# Patient Record
Sex: Male | Born: 1965 | Hispanic: No | Marital: Married | Smoking: Never smoker
Health system: Southern US, Community
[De-identification: ages and names within clinical notes are randomized; demographics above are authoritative.]

## PROBLEM LIST (undated history)

## (undated) DIAGNOSIS — E119 Type 2 diabetes mellitus without complications: Secondary | ICD-10-CM

## (undated) DIAGNOSIS — R413 Other amnesia: Secondary | ICD-10-CM

## (undated) DIAGNOSIS — R188 Other ascites: Secondary | ICD-10-CM

## (undated) DIAGNOSIS — K7030 Alcoholic cirrhosis of liver without ascites: Secondary | ICD-10-CM

---

## 2019-05-23 ENCOUNTER — Ambulatory Visit (INDEPENDENT_AMBULATORY_CARE_PROVIDER_SITE_OTHER): Payer: Self-pay

## 2019-05-23 ENCOUNTER — Encounter: Payer: Self-pay | Admitting: Emergency Medicine

## 2019-05-23 ENCOUNTER — Other Ambulatory Visit: Payer: Self-pay

## 2019-05-23 ENCOUNTER — Ambulatory Visit
Admission: EM | Admit: 2019-05-23 | Discharge: 2019-05-23 | Disposition: A | Discharge status: Nursing Home (Includes ICF) | Payer: Self-pay | Attending: Urgent Care | Admitting: Urgent Care

## 2019-05-23 ENCOUNTER — Emergency Department
Admission: EM | Admit: 2019-05-23 | Discharge: 2019-05-24 | Disposition: A | Discharge status: Other Acute Inpatient Hospital | Payer: HRSA Program | Attending: Emergency Medicine | Admitting: Emergency Medicine

## 2019-05-23 DIAGNOSIS — E871 Hypo-osmolality and hyponatremia: Secondary | ICD-10-CM | POA: Diagnosis not present

## 2019-05-23 DIAGNOSIS — R6889 Other general symptoms and signs: Secondary | ICD-10-CM

## 2019-05-23 DIAGNOSIS — R05 Cough: Secondary | ICD-10-CM

## 2019-05-23 DIAGNOSIS — U071 COVID-19: Secondary | ICD-10-CM | POA: Diagnosis not present

## 2019-05-23 DIAGNOSIS — R509 Fever, unspecified: Secondary | ICD-10-CM | POA: Diagnosis not present

## 2019-05-23 DIAGNOSIS — J189 Pneumonia, unspecified organism: Secondary | ICD-10-CM | POA: Insufficient documentation

## 2019-05-23 DIAGNOSIS — R1084 Generalized abdominal pain: Secondary | ICD-10-CM | POA: Diagnosis present

## 2019-05-23 DIAGNOSIS — Z20822 Contact with and (suspected) exposure to covid-19: Secondary | ICD-10-CM

## 2019-05-23 DIAGNOSIS — R0902 Hypoxemia: Secondary | ICD-10-CM | POA: Insufficient documentation

## 2019-05-23 DIAGNOSIS — R059 Cough, unspecified: Secondary | ICD-10-CM

## 2019-05-23 DIAGNOSIS — B342 Coronavirus infection, unspecified: Secondary | ICD-10-CM

## 2019-05-23 LAB — COMPREHENSIVE METABOLIC PANEL
ALT: 112 U/L — ABNORMAL HIGH (ref 0–44)
AST: 303 U/L — ABNORMAL HIGH (ref 15–41)
Albumin: 3.5 g/dL (ref 3.5–5.0)
Alkaline Phosphatase: 142 U/L — ABNORMAL HIGH (ref 38–126)
Anion gap: 15 (ref 5–15)
BUN: 18 mg/dL (ref 6–20)
CO2: 22 mmol/L (ref 22–32)
Calcium: 8.3 mg/dL — ABNORMAL LOW (ref 8.9–10.3)
Chloride: 88 mmol/L — ABNORMAL LOW (ref 98–111)
Creatinine, Ser: 1.05 mg/dL (ref 0.61–1.24)
GFR calc Af Amer: >60 mL/min (ref >60)
GFR calc non Af Amer: >60 mL/min (ref >60)
Glucose, Bld: 267 mg/dL — ABNORMAL HIGH (ref 70–99)
Potassium: 3.3 mmol/L — ABNORMAL LOW (ref 3.5–5.1)
Sodium: 125 mmol/L — ABNORMAL LOW (ref 135–145)
Total Bilirubin: 1.6 mg/dL — ABNORMAL HIGH (ref 0.3–1.2)
Total Protein: 8.1 g/dL (ref 6.5–8.1)

## 2019-05-23 LAB — URINALYSIS, COMPLETE (UACMP) WITH MICROSCOPIC
Bacteria, UA: NONE SEEN
Bilirubin Urine: NEGATIVE
Glucose, UA: >=500 mg/dL — AB
Hgb urine dipstick: NEGATIVE
Ketones, ur: NEGATIVE mg/dL
Leukocytes,Ua: NEGATIVE
Nitrite: NEGATIVE
Protein, ur: 100 mg/dL — AB
Specific Gravity, Urine: 1.026 (ref 1.005–1.030)
pH: 6 (ref 5.0–8.0)

## 2019-05-23 LAB — CBC
HCT: 45 % (ref 39.0–52.0)
Hemoglobin: 16.6 g/dL (ref 13.0–17.0)
MCH: 33.9 pg (ref 26.0–34.0)
MCHC: 36.9 g/dL — ABNORMAL HIGH (ref 30.0–36.0)
MCV: 92 fL (ref 80.0–100.0)
Platelets: 146 10*3/uL — ABNORMAL LOW (ref 150–400)
RBC: 4.89 MIL/uL (ref 4.22–5.81)
RDW: 11.6 % (ref 11.5–15.5)
WBC: 6.6 10*3/uL (ref 4.0–10.5)
nRBC: 0 % (ref 0.0–0.2)

## 2019-05-23 LAB — SARS CORONAVIRUS 2 BY RT PCR (HOSPITAL ORDER, PERFORMED IN ~~LOC~~ HOSPITAL LAB): SARS Coronavirus 2: POSITIVE — AB

## 2019-05-23 LAB — LIPASE, BLOOD: Lipase: 59 U/L — ABNORMAL HIGH (ref 11–51)

## 2019-05-23 MED ORDER — SODIUM CHLORIDE 0.9 % IV SOLN
2.0000 g | INTRAVENOUS | Status: DC
  Administered 2019-05-24: 2 g via INTRAVENOUS
  Filled 2019-05-23: qty 20

## 2019-05-23 MED ORDER — ACETAMINOPHEN 500 MG PO TABS
1000.0000 mg | ORAL_TABLET | Freq: Once | ORAL | Status: AC
  Administered 2019-05-24: 1000 mg via ORAL
  Filled 2019-05-23: qty 2

## 2019-05-23 MED ORDER — THIAMINE HCL 100 MG/ML IJ SOLN: 100.0000 mg | Freq: Every day | INTRAMUSCULAR | Status: DC

## 2019-05-23 MED ORDER — LORAZEPAM 2 MG/ML IJ SOLN
0.0000 mg | Freq: Four times a day (QID) | INTRAMUSCULAR | Status: DC
  Filled 2019-05-23: qty 1

## 2019-05-23 MED ORDER — SODIUM CHLORIDE 0.9 % IV BOLUS (SEPSIS)
1000.0000 mL | Freq: Once | INTRAVENOUS | Status: AC
  Administered 2019-05-24: 1000 mL via INTRAVENOUS

## 2019-05-23 MED ORDER — SODIUM CHLORIDE 0.9 % IV SOLN
500.0000 mg | INTRAVENOUS | Status: DC
  Administered 2019-05-24: 500 mg via INTRAVENOUS
  Filled 2019-05-23: qty 500

## 2019-05-23 MED ORDER — SODIUM CHLORIDE 0.9 % IV BOLUS (SEPSIS)
500.0000 mL | Freq: Once | INTRAVENOUS | Status: AC
  Administered 2019-05-24: 500 mL via INTRAVENOUS

## 2019-05-23 NOTE — ED Triage Notes (Addendum)
Pt complains of fever, loss of appetite, cough that causes him to vomit. Did have some abdominal pain yesterday and did take something over the counter and made his stomach feel better. Last time he was able to eat a full meal was Saturday. Did have some chicken soup today

## 2019-05-23 NOTE — ED Provider Notes (Signed)
Name: Shawn Torres DOB: 10/12/1966 MRN: [id number] CSN: [id number] PCP: Patient, No Pcp Per  Arrival date and time:  05/23/19 1659  Chief Complaint:  Fever and Cough   NOTE: Prior to seeing the patient today, I have reviewed the triage nursing documentation and vital signs. Clinical staff has updated patient's PMH/PSHx, current medication list, and drug allergies/intolerances to ensure comprehensive history available to assist in medical decision making.   History:    NOTE: Patient is Spanish speaking only. H&P facilitated by both tele-interpreter service and daughter.   HPI: Shawn Torres is a 53 y.o. male who presents today with complaints of cough and fever that began on Saturday (05/18/2019); Tmax unknown.  He has not been using any type of antipyretic medications at home. His cough is reported to be productive of white sputum. Patient advises that the cough is so bad that it causes him to experience post tussive emesis episodes. 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  He advises that there have been several people at his job who have tested positive for "the virus". He is unable to report how much contact he had with the infected individuals. Daughter states, "they told him there were people there with the virus, but they didn't tell him who it was". Patient indicating that he has never been tested for SARS-CoV-2 (novel coronavirus).  Additionally, he notes that he has had diffuse abdominal pain that he feels is secondary to constipation.  Patient notes that his LNBM was several days ago, however he states that he is "unable to remember for sure".  Patient advising that he took some over-the-counter "leche" yesterday, which caused his bowels to move and made his stomach feel better overall. Since the onset of his symptoms on Saturday, patient notes that he has had persistent nausea and vomiting. Oral intake has been poor per daughter's report.  Patient's last normal meal  was on Saturday.  She advises that he was able to tolerate some chicken soup today, however advises that the amount was very little. Daughter also makes mention of the fact that patient is a heavy daily alcohol drinker, however the amount of alcohol has been less in the setting of his acute illness. Daughter questioning correlation between his abdominal pain and daily alcohol intake.   Patient presents to clinic today febrile at 100.8. He is not tachycardic at rest; low 110s with ambulation. His pulse oximetry on room air was found to be on the lower side of normal (92%) in triage; desaturates with ambulation (as low as 88%). Of note, patient's daughter and wife in the exam room in close proximity to patient and not wearing masks/face coverings.   History reviewed. No pertinent past medical history.  History reviewed. No pertinent surgical history.  Family History  Family history unknown: Yes    Social History   Tobacco Use   Smoking status: Never Smoker   Smokeless tobacco: Never Used  Substance Use Topics   Alcohol use: Yes   Drug use: Never    Home Medications:    No outpatient medications have been marked as taking for the 05/23/19 encounter [facility](Hospital Encounter).    Allergies:   Patient has no known allergies.  Review of Systems (ROS): Review of Systems  Constitutional: Positive for activity change (decreased), appetite change (decreased), fatigue and fever (Tmax unknown). Negative for chills.  HENT: Positive for congestion (mild). Negative for ear pain, rhinorrhea, sinus pain, sore throat and trouble swallowing.   Eyes: Negative  for pain, discharge and redness.  Respiratory: Positive for cough (productive) and shortness of breath.   Cardiovascular: Negative for chest pain and palpitations.  Gastrointestinal: Positive for abdominal pain (improved some following BM), constipation (LNBM several days ago. Took some OTC "leche" yesterday and was able to have a small bowel  movement), nausea and vomiting.  Genitourinary: Negative for dysuria, flank pain, frequency, hematuria and urgency.  Musculoskeletal: Positive for myalgias. Negative for arthralgias, back pain, neck pain and neck stiffness.  Skin: Negative for color change and pallor.  Neurological: Positive for tremors and weakness (generalized). Negative for dizziness, syncope and headaches.  Hematological: Negative for adenopathy.     Triage Vital Signs: ED Triage Vitals  Enc Vitals Group     BP 05/23/19 1714 (!) 144/94     Pulse Rate 05/23/19 1714 95     Resp 05/23/19 1714 18     Temp 05/23/19 1714 (!) 100.8 F (38.2 C)     Temp Source 05/23/19 1714 Oral     SpO2 05/23/19 1714 92 %     Weight 05/23/19 1718 180 lb (81.6 kg)     Height 05/23/19 1718 5\' 6"  (1.676 m)     Head Circumference --      Peak Flow --      Pain Score 05/23/19 1718 0     Pain Loc --      Pain Edu? --      Excl. in GC? --     Physical Exam: Physical Exam  Constitutional: He is oriented to person, place, and time. He appears not lethargic, to not be writhing in pain, not dehydrated and not jaundiced. He appears not cachectic. He has a sickly appearance (acutely ill appearing; listless). He appears distressed (mild 2/2 acute respiratory condition).  HENT:  Head: Normocephalic and atraumatic.  Mouth/Throat: Oropharynx is clear and moist and mucous membranes are normal.  Eyes: Pupils are equal, round, and reactive to light. EOM are normal.  Neck: Normal range of motion. Neck supple. No tracheal deviation present. No thyromegaly present.  Cardiovascular: Regular rhythm, normal heart sounds and intact distal pulses. Tachycardia present. Exam reveals no gallop and no friction rub.  No murmur heard. Pulmonary/Chest: Tachypnea noted. He has no wheezes. He has rhonchi (scattered throughout; does not improve/clear with cough). He has no rales.  Able to maintain supine positioning without significant difficulties. Breathing stable  at rest, however WOB increases with short distance ambulation.  Abdominal: Soft. Normal appearance and bowel sounds are normal. He exhibits no distension and no ascites. There is no abdominal tenderness. There is no rebound, no guarding and no CVA tenderness.  Lymphadenopathy:    He has no cervical adenopathy.  Neurological: He is alert and oriented to person, place, and time. He appears not lethargic. He displays tremor (fine). Gait normal. GCS score is 15.  Skin: Skin is warm, dry and intact. He is not diaphoretic. No pallor.  (+) facial flushing. No rashes or petechiae observed.   Psychiatric: Mood, memory, affect and judgment normal.  Nursing note and vitals reviewed.   Urgent Care Treatments / Results:   LABS: PLEASE NOTE: all labs that were ordered this encounter are listed, however only abnormal results are displayed. Labs Reviewed - No data to display  EKG: -None  RADIOLOGY: Dg Chest 2 View  Result Date: 05/23/2019 CLINICAL DATA:  Cough EXAM: CHEST - 2 VIEW COMPARISON:  None. FINDINGS: There are multifocal airspace opacities bilaterally. No pneumothorax. No large pleural effusion. The heart size  is enlarged. There is no acute osseous abnormality detected on this exam. IMPRESSION: Multifocal airspace opacities concerning for multifocal pneumonia (viral or bacterial). Electronically Signed   By: Katherine Mantlehristopher  Green M.D.   On: 05/23/2019 17:47    PROCEDURES: Procedures  MEDICATIONS RECEIVED THIS VISIT: Medications - No data to display  PERTINENT CLINICAL COURSE NOTES/UPDATES: Clinical Course as of May 23 609  Thu May 23, 2019  1801 Report called to ED charge RN Daphine Deutscher(Martin, RN) to advise of presenting complaints, assessment, and transfer plan of care to the ED for further evaluation and admission for CAP secondary to probable SARS-CoV-2 (novel coronavirus) infection. Patient will be arriving via POV with his family.    [BG]    Clinical Course User Index [BG] Verlee MonteGray, Alanny Rivers E, NP    Initial Impression / Assessment and Plan / Urgent Care Course:    Shawn Torres is a 53 y.o. male who [facility] with complaints of Fever and Cough   Pertinent labs & imaging results that were available during my care of the patient were personally reviewed by me and considered in my medical decision making (see lab/imaging section of note for values and interpretations).  Patient acutely ill appearing today in clinic. His presenting symptom constellation has been progressive since Saturday. He has had an intermittently productive cough (white sputum) with associated post-tussive emesis episodes. He has reportedly been febrile at home, however temperatures have not been measured; presents with fever of 100.8 today. He has had persistent nausea, vomiting, and constipation  since Saturday. (+) abdominal pain from Saturday until yesterday; relieved after taking OTC intervention which produced a small bowel movement. His oral fluid intake has been seemingly adequate, however his appetite has been decreased overall. Patient reports anticipatory anorexia secondary to uncontrolled nausea and resulting emesis episodes.  Patient in mild distress in clinic today. His WOB significantly increases with ambulation. SPO2 92% at rest on RA; decreases as low as 88% with short distance ambulation. Cough and fever (Tmax unknown) concerning for SARS-CoV-2 (novel coronavirus) given presumed occupational exposures. Diagnostic radiographs of the PA and lateral chest reveal multiple opacities BILATERALLY consistent with presumed multifocal CAP. Patient's current condition does not supportive safe and effective treatment on an outpatient basis, as he has the potential for rapid clinical deterioration leading to overt respiratory failure. Given poor nutritional status over the course of acute illness, anticipate dehydration and electrolyte derangements. Additionally, patient is a daily heavy drinker, which  gives rise to concern for impending delirium tremens associated with alcohol withdrawals. Discussed need for transfer to the emergency department for further evaluation and admission for IV rehydration, antibiotics, and further supportive care.  Patient and family in agreement with POC as discussed. Will defer labs in the urgent care setting, as the emergency department has historically repeated the same labs upon arrival despite them being obtained here. In light of current clinical condition, discussed transferring to hospital via EMS, however patient and family refuse citing that they feel comfortable with presenting via POV. Education provided to patient and family regarding high suspicion that CAP likely caused by SARS-CoV-2, thus it is imperative that they all wear masks to prevent transmission amongst the family, as well as to others, during the transfer process.   Report called to [facility] emergency department charge nurse Daphine Deutscher(Martin, RN). Reviewed presenting complaints, assessment, and transfer POC to their facility. Aware of CAP with high suspicion for SARS-CoV-2. Attempted to convince patient to consider EMS transport; refused. Patient will be arriving  via POV with his family.   Final Clinical Impressions(s) / Urgent Care Diagnoses:   Final diagnoses:  Cough  Community acquired pneumonia, unspecified laterality  Oxygen desaturation  Suspected Covid-19 Virus Infection    New Prescriptions:  No orders of the defined types were placed in this encounter.   Controlled Substance Prescriptions:  [place] Controlled Substance Registry consulted? Not Applicable  NOTE: This note was prepared using Dragon dictation software along with smaller phrase technology. Despite my best ability to proofread, there is the potential that transcriptional errors may still occur from this process, and are completely unintentional.     Karen Kitchens, NP 05/24/19 [phone]

## 2019-05-23 NOTE — Discharge Instructions (Signed)
GO TO THE [facility] EMERGENCY ROOM DIRECTLY FROM URGENT CARE. YOU WILL BE ADMITTED.   Honor Loh, MSN, APRN, FNP-C, CEN Advanced Practice Provider [facility] 05/23/2019 5:57 PM

## 2019-05-23 NOTE — ED Provider Notes (Signed)
[facility] Emergency Department Provider Note   ____________________________________________   First MD Initiated Contact with Patient 05/23/19 2310     (approximate)  I have reviewed the triage vital signs and the nursing notes.   HISTORY  Chief Complaint Abdominal Pain, Fever, Emesis, and Cough  History obtained via Spanish interpreter  HPI Shawn Torres is a 53 y.o. male sent to the ED from urgent care for fever, cough, loss of appetite, abdominal pain.  Symptoms began 5 to 6 days ago.  Complains of posttussive emesis.  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  Chest x-ray at urgent care demonstrates bilateral pneumonia.  He was hypoxic with ambulation to 88% on room air.  Referred to the ED for further evaluation and treatment.  Denies chest pain, dysuria, diarrhea.  Thought he was constipated so took some over-the-counter medications and felt relief afterwards.       Past medical history None  There are no active problems to display for this patient.   History reviewed. No pertinent surgical history.  Prior to Admission medications   Not on File    Allergies Patient has no known allergies.  Family History  Family history unknown: Yes    Social History Social History   Tobacco Use  . Smoking status: Never Smoker  . Smokeless tobacco: Never Used  Substance Use Topics  . Alcohol use: Yes  . Drug use: Never    Review of Systems  Constitutional: Positive for fever/chills Eyes: No visual changes. ENT: No sore throat. Cardiovascular: Denies chest pain. Respiratory: Positive for cough and shortness of breath. Gastrointestinal: Positive for generalized abdominal pain and nausea, no vomiting.  No diarrhea.  No constipation. Genitourinary: Negative for dysuria. Musculoskeletal: Negative for back pain. Skin: Negative for rash. Neurological: Negative  for headaches, focal weakness or numbness.   ____________________________________________   PHYSICAL EXAM:  VITAL SIGNS: ED Triage Vitals  Enc Vitals Group     BP 05/23/19 1901 (!) 148/94     Pulse Rate 05/23/19 1901 (!) 105     Resp 05/23/19 1901 18     Temp 05/23/19 1901 (!) 100.9 F (38.3 C)     Temp Source 05/23/19 1901 Oral     SpO2 05/23/19 2237 95 %     Weight 05/23/19 1901 179 lb 14.3 oz (81.6 kg)     Height 05/23/19 1901 5\' 6"  (1.676 m)     Head Circumference --      Peak Flow --      Pain Score --      Pain Loc --      Pain Edu? --      Excl. in GC? --     Constitutional: Alert and oriented. Well appearing and in mild acute distress. Eyes: Conjunctivae are normal. PERRL. EOMI. Head: Atraumatic. Nose: No congestion/rhinnorhea. Mouth/Throat: Mucous membranes are moist.  Oropharynx non-erythematous. Neck: No stridor.   Cardiovascular: Tachycardic rate, regular rhythm. Grossly normal heart sounds.  Good peripheral circulation. Respiratory: Increased respiratory effort.  No retractions. Lungs with scattered rhonchi. Gastrointestinal: Soft and nontender to light or deep palpation. No distention. No abdominal bruits. No CVA tenderness. Musculoskeletal: No lower extremity tenderness nor edema.  No joint effusions. Neurologic:  Normal speech and language. No gross focal neurologic deficits are appreciated.  Skin:  Skin is warm, dry and intact. No rash noted.  No petechiae. Psychiatric: Mood and affect are  normal. Speech and behavior are normal.  ____________________________________________   LABS (all labs ordered are listed, but only abnormal results are displayed)  Labs Reviewed  SARS CORONAVIRUS 2 (HOSPITAL ORDER, PERFORMED IN [facility]) - Abnormal; Notable for the following components:      Result Value   SARS Coronavirus 2 POSITIVE (*)    All other components within normal limits  LIPASE, BLOOD - Abnormal; Notable for the following components:    Lipase 59 (*)    All other components within normal limits  COMPREHENSIVE METABOLIC PANEL - Abnormal; Notable for the following components:   Sodium 125 (*)    Potassium 3.3 (*)    Chloride 88 (*)    Glucose, Bld 267 (*)    Calcium 8.3 (*)    AST 303 (*)    ALT 112 (*)    Alkaline Phosphatase 142 (*)    Total Bilirubin 1.6 (*)    All other components within normal limits  CBC - Abnormal; Notable for the following components:   MCHC 36.9 (*)    Platelets 146 (*)    All other components within normal limits  URINALYSIS, COMPLETE (UACMP) WITH MICROSCOPIC - Abnormal; Notable for the following components:   Color, Urine AMBER (*)    APPearance HAZY (*)    Glucose, UA >=500 (*)    Protein, ur 100 (*)    All other components within normal limits  CULTURE, BLOOD (ROUTINE X 2)  CULTURE, BLOOD (ROUTINE X 2)  URINE CULTURE  LACTIC ACID, PLASMA  LACTIC ACID, PLASMA  URINALYSIS, ROUTINE W REFLEX MICROSCOPIC  ETHANOL   ____________________________________________  EKG  ED ECG REPORT I, SUNG,JADE J, the attending physician, personally viewed and interpreted this ECG.   Date: 05/24/2019  EKG Time: 0025  Rate: 90  Rhythm: normal EKG, normal sinus rhythm  Axis: LAD  Intervals:none  ST&T Change: Nonspecific  ____________________________________________  RADIOLOGY  ED MD interpretation:  Multifocal pneumonia  Official radiology report(s): Dg Chest 2 View  Result Date: 05/23/2019 CLINICAL DATA:  Cough EXAM: CHEST - 2 VIEW COMPARISON:  None. FINDINGS: There are multifocal airspace opacities bilaterally. No pneumothorax. No large pleural effusion. The heart size is enlarged. There is no acute osseous abnormality detected on this exam. IMPRESSION: Multifocal airspace opacities concerning for multifocal pneumonia (viral or bacterial). Electronically Signed   By: Katherine Mantlehristopher  Green M.D.   On: 05/23/2019 17:47    ____________________________________________   PROCEDURES   Procedure(s) performed (including Critical Care):  Procedures   ____________________________________________   INITIAL IMPRESSION / ASSESSMENT AND PLAN / ED COURSE  As part of my medical decision making, I reviewed the following data within the electronic MEDICAL RECORD NUMBER Nursing notes reviewed and incorporated, Labs reviewed, Old chart reviewed, Radiograph reviewed, Notes from prior ED visits and [place] Controlled Substance Database     Shawn Torres was evaluated in Emergency Department on 05/24/2019 for the symptoms described in the history of present illness. He was evaluated in the context of the global COVID-19 pandemic, which necessitated consideration that the patient might be at risk for infection with the SARS-CoV-2 virus that causes COVID-19. Institutional protocols and algorithms that pertain to the evaluation of patients at risk for COVID-19 are in a state of rapid change based on information released by regulatory bodies including the CDC and federal and state organizations. These policies and algorithms were followed during the patient's care in the ED.   53 year old male who presents with fever, cough, shortness of breath, lack of  appetite; probable exposure to coworkers with coronavirus.  Differential diagnosis includes but is not limited to pneumonia, viral process, coronavirus, metabolic etiologies, etc.  Daughter mentioned that patient is a daily heavy drinker.  Will place on CIWA scale.  Obtain blood cultures, administer IV antibiotics for community-acquired pneumonia.  Awaiting results of COVID swab.   Clinical Course as of May 23 33  Fri May 24, 2019  0003 Patient is Covid positive.  Will call [facility] to transfer.   [JS]  0029 Spoke with patient's daughter via telephone to update her of patient's status and transfer.   [JS]  0034 Patient accepted to [facility].  Awaiting [facility] transport.   [JS]    Clinical Course User Index [JS] Paulette Blanch, MD      ____________________________________________   FINAL CLINICAL IMPRESSION(S) / ED DIAGNOSES  Final diagnoses:  Fever, unspecified fever cause  Community acquired pneumonia, unspecified laterality  Coronavirus infection  Hyponatremia  Hypoxia     ED Discharge Orders    None       Note:  This document was prepared using Dragon voice recognition software and may include unintentional dictation errors.   Paulette Blanch, MD 05/24/19 [phone]

## 2019-05-23 NOTE — ED Triage Notes (Signed)
Patient seen at [facility] for fever, loss of appetite and cough for 2 days. In triage, patient is denying fever at home, however states he has been vomiting anything he eats since Saturday.

## 2019-05-24 ENCOUNTER — Inpatient Hospital Stay (HOSPITAL_COMMUNITY)
Admission: AD | Admit: 2019-05-24 | Discharge: 2019-05-30 | DRG: 177 | Disposition: A | Discharge status: Home / Self Care | Payer: HRSA Program | Source: Other Acute Inpatient Hospital | Attending: Internal Medicine | Admitting: Internal Medicine

## 2019-05-24 ENCOUNTER — Encounter (HOSPITAL_COMMUNITY): Payer: Self-pay | Admitting: Family Medicine

## 2019-05-24 ENCOUNTER — Telehealth: Payer: Self-pay

## 2019-05-24 DIAGNOSIS — R7401 Elevation of levels of liver transaminase levels: Secondary | ICD-10-CM

## 2019-05-24 DIAGNOSIS — U071 COVID-19: Principal | ICD-10-CM | POA: Diagnosis present

## 2019-05-24 DIAGNOSIS — E861 Hypovolemia: Secondary | ICD-10-CM | POA: Diagnosis present

## 2019-05-24 DIAGNOSIS — J9601 Acute respiratory failure with hypoxia: Secondary | ICD-10-CM | POA: Diagnosis present

## 2019-05-24 DIAGNOSIS — F10230 Alcohol dependence with withdrawal, uncomplicated: Secondary | ICD-10-CM | POA: Diagnosis not present

## 2019-05-24 DIAGNOSIS — E871 Hypo-osmolality and hyponatremia: Secondary | ICD-10-CM | POA: Diagnosis not present

## 2019-05-24 DIAGNOSIS — E878 Other disorders of electrolyte and fluid balance, not elsewhere classified: Secondary | ICD-10-CM | POA: Diagnosis not present

## 2019-05-24 DIAGNOSIS — E1165 Type 2 diabetes mellitus with hyperglycemia: Secondary | ICD-10-CM | POA: Diagnosis not present

## 2019-05-24 DIAGNOSIS — J1289 Other viral pneumonia: Secondary | ICD-10-CM | POA: Diagnosis present

## 2019-05-24 DIAGNOSIS — J1282 Pneumonia due to coronavirus disease 2019: Secondary | ICD-10-CM | POA: Diagnosis present

## 2019-05-24 DIAGNOSIS — E876 Hypokalemia: Secondary | ICD-10-CM | POA: Diagnosis not present

## 2019-05-24 DIAGNOSIS — R740 Nonspecific elevation of levels of transaminase and lactic acid dehydrogenase [LDH]: Secondary | ICD-10-CM | POA: Diagnosis not present

## 2019-05-24 LAB — GLUCOSE, CAPILLARY
Glucose-Capillary: 227 mg/dL — ABNORMAL HIGH (ref 70–99)
Glucose-Capillary: 199 mg/dL — ABNORMAL HIGH (ref 70–99)
Glucose-Capillary: 199 mg/dL — ABNORMAL HIGH (ref 70–99)
Glucose-Capillary: 174 mg/dL — ABNORMAL HIGH (ref 70–99)
Glucose-Capillary: 111 mg/dL — ABNORMAL HIGH (ref 70–99)

## 2019-05-24 LAB — CBC WITH DIFFERENTIAL/PLATELET
Abs Immature Granulocytes: 0.02 10*3/uL (ref 0.00–0.07)
Basophils Absolute: 0 10*3/uL (ref 0.0–0.1)
Basophils Relative: 1 %
Eosinophils Absolute: 0 10*3/uL (ref 0.0–0.5)
Eosinophils Relative: 0 %
HCT: 42.6 % (ref 39.0–52.0)
Hemoglobin: 15.4 g/dL (ref 13.0–17.0)
Immature Granulocytes: 1 %
Lymphocytes Relative: 45 %
Lymphs Abs: 1.9 10*3/uL (ref 0.7–4.0)
MCH: 34.4 pg — ABNORMAL HIGH (ref 26.0–34.0)
MCHC: 36.2 g/dL — ABNORMAL HIGH (ref 30.0–36.0)
MCV: 95.1 fL (ref 80.0–100.0)
Monocytes Absolute: 0.5 10*3/uL (ref 0.1–1.0)
Monocytes Relative: 11 %
Neutro Abs: 1.7 10*3/uL (ref 1.7–7.7)
Neutrophils Relative %: 42 %
Platelets: 114 10*3/uL — ABNORMAL LOW (ref 150–400)
RBC: 4.48 MIL/uL (ref 4.22–5.81)
RDW: 11.9 % (ref 11.5–15.5)
WBC: 4.2 10*3/uL (ref 4.0–10.5)
nRBC: 0 % (ref 0.0–0.2)

## 2019-05-24 LAB — COMPREHENSIVE METABOLIC PANEL
ALT: 100 U/L — ABNORMAL HIGH (ref 0–44)
AST: 269 U/L — ABNORMAL HIGH (ref 15–41)
Albumin: 2.7 g/dL — ABNORMAL LOW (ref 3.5–5.0)
Alkaline Phosphatase: 113 U/L (ref 38–126)
Anion gap: 7 (ref 5–15)
BUN: 16 mg/dL (ref 6–20)
CO2: 26 mmol/L (ref 22–32)
Calcium: 7.4 mg/dL — ABNORMAL LOW (ref 8.9–10.3)
Chloride: 97 mmol/L — ABNORMAL LOW (ref 98–111)
Creatinine, Ser: 1.03 mg/dL (ref 0.61–1.24)
GFR calc Af Amer: >60 mL/min (ref >60)
GFR calc non Af Amer: >60 mL/min (ref >60)
Glucose, Bld: 228 mg/dL — ABNORMAL HIGH (ref 70–99)
Potassium: 3.2 mmol/L — ABNORMAL LOW (ref 3.5–5.1)
Sodium: 130 mmol/L — ABNORMAL LOW (ref 135–145)
Total Bilirubin: 0.9 mg/dL (ref 0.3–1.2)
Total Protein: 6.7 g/dL (ref 6.5–8.1)

## 2019-05-24 LAB — BASIC METABOLIC PANEL
Anion gap: 12 (ref 5–15)
BUN: 15 mg/dL (ref 6–20)
CO2: 25 mmol/L (ref 22–32)
Calcium: 7.8 mg/dL — ABNORMAL LOW (ref 8.9–10.3)
Chloride: 98 mmol/L (ref 98–111)
Creatinine, Ser: 0.9 mg/dL (ref 0.61–1.24)
GFR calc Af Amer: >60 mL/min (ref >60)
GFR calc non Af Amer: >60 mL/min (ref >60)
Glucose, Bld: 166 mg/dL — ABNORMAL HIGH (ref 70–99)
Potassium: 3.8 mmol/L (ref 3.5–5.1)
Sodium: 135 mmol/L (ref 135–145)

## 2019-05-24 LAB — LACTIC ACID, PLASMA: Lactic Acid, Venous: 1.1 mmol/L (ref 0.5–1.9)

## 2019-05-24 LAB — D-DIMER, QUANTITATIVE: D-Dimer, Quant: 0.91 ug/mL-FEU — ABNORMAL HIGH (ref 0.00–0.50)

## 2019-05-24 LAB — ABO/RH: ABO/RH(D): O POS

## 2019-05-24 LAB — MAGNESIUM: Magnesium: 1.9 mg/dL (ref 1.7–2.4)

## 2019-05-24 LAB — PROCALCITONIN: Procalcitonin: 0.61 ng/mL

## 2019-05-24 LAB — HIV ANTIBODY (ROUTINE TESTING W REFLEX): HIV Screen 4th Generation wRfx: NONREACTIVE

## 2019-05-24 LAB — HEMOGLOBIN A1C
Hgb A1c MFr Bld: 10.8 % — ABNORMAL HIGH (ref 4.8–5.6)
Mean Plasma Glucose: 263.26 mg/dL

## 2019-05-24 LAB — FERRITIN: Ferritin: 1137 ng/mL — ABNORMAL HIGH (ref 24–336)

## 2019-05-24 LAB — C-REACTIVE PROTEIN: CRP: 5.3 mg/dL — ABNORMAL HIGH (ref <1.0)

## 2019-05-24 MED ORDER — ENOXAPARIN SODIUM 40 MG/0.4ML ~~LOC~~ SOLN
40.0000 mg | SUBCUTANEOUS | Status: DC
  Administered 2019-05-24 – 2019-05-30 (×7): 40 mg via SUBCUTANEOUS
  Filled 2019-05-24 (×7): qty 0.4

## 2019-05-24 MED ORDER — INSULIN ASPART 100 UNIT/ML ~~LOC~~ SOLN
0.0000 [IU] | Freq: Three times a day (TID) | SUBCUTANEOUS | Status: DC
  Administered 2019-05-24 (×3): 2 [IU] via SUBCUTANEOUS
  Administered 2019-05-25: 1 [IU] via SUBCUTANEOUS
  Administered 2019-05-25: 2 [IU] via SUBCUTANEOUS

## 2019-05-24 MED ORDER — ZINC SULFATE 220 (50 ZN) MG PO CAPS
220.0000 mg | ORAL_CAPSULE | Freq: Every day | ORAL | Status: DC
  Administered 2019-05-24 – 2019-05-30 (×7): 220 mg via ORAL
  Filled 2019-05-24 (×7): qty 1

## 2019-05-24 MED ORDER — INSULIN ASPART 100 UNIT/ML ~~LOC~~ SOLN
3.0000 [IU] | Freq: Three times a day (TID) | SUBCUTANEOUS | Status: DC
  Administered 2019-05-24 – 2019-05-25 (×6): 3 [IU] via SUBCUTANEOUS

## 2019-05-24 MED ORDER — SODIUM CHLORIDE 0.9 % IV SOLN
INTRAVENOUS | Status: DC
  Administered 2019-05-24: 07:00:00 via INTRAVENOUS

## 2019-05-24 MED ORDER — ACETAMINOPHEN 325 MG PO TABS
650.0000 mg | ORAL_TABLET | Freq: Four times a day (QID) | ORAL | Status: DC | PRN
  Administered 2019-05-24 – 2019-05-25 (×2): 650 mg via ORAL
  Filled 2019-05-24 (×2): qty 2

## 2019-05-24 MED ORDER — INSULIN DETEMIR 100 UNIT/ML ~~LOC~~ SOLN
5.0000 [IU] | Freq: Every day | SUBCUTANEOUS | Status: DC
  Filled 2019-05-24: qty 0.05

## 2019-05-24 MED ORDER — VITAMIN C 500 MG PO TABS
500.0000 mg | ORAL_TABLET | Freq: Every day | ORAL | Status: DC
  Administered 2019-05-24 – 2019-05-30 (×7): 500 mg via ORAL
  Filled 2019-05-24 (×8): qty 1

## 2019-05-24 MED ORDER — POTASSIUM CHLORIDE CRYS ER 20 MEQ PO TBCR
20.0000 meq | EXTENDED_RELEASE_TABLET | Freq: Two times a day (BID) | ORAL | Status: DC
  Administered 2019-05-24 (×2): 20 meq via ORAL
  Filled 2019-05-24 (×3): qty 1

## 2019-05-24 MED ORDER — INSULIN ASPART 100 UNIT/ML ~~LOC~~ SOLN
0.0000 [IU] | Freq: Every day | SUBCUTANEOUS | Status: DC
  Administered 2019-05-25: 4 [IU] via SUBCUTANEOUS

## 2019-05-24 MED ORDER — GUAIFENESIN-DM 100-10 MG/5ML PO SYRP
10.0000 mL | ORAL_SOLUTION | ORAL | Status: DC | PRN
  Administered 2019-05-25 – 2019-05-26 (×2): 10 mL via ORAL
  Filled 2019-05-24 (×2): qty 10

## 2019-05-24 MED ORDER — ONDANSETRON HCL 4 MG/2ML IJ SOLN: 4.0000 mg | Freq: Four times a day (QID) | INTRAMUSCULAR | Status: DC | PRN

## 2019-05-24 MED ORDER — LORAZEPAM 2 MG/ML IJ SOLN: 1.0000 mg | Freq: Four times a day (QID) | INTRAMUSCULAR | Status: AC | PRN

## 2019-05-24 MED ORDER — FOLIC ACID 1 MG PO TABS
1.0000 mg | ORAL_TABLET | Freq: Every day | ORAL | Status: DC
  Administered 2019-05-24 – 2019-05-30 (×7): 1 mg via ORAL
  Filled 2019-05-24 (×7): qty 1

## 2019-05-24 MED ORDER — LIVING WELL WITH DIABETES BOOK - IN SPANISH
Freq: Once | Status: AC
  Administered 2019-05-24: 1
  Filled 2019-05-24: qty 1

## 2019-05-24 MED ORDER — MAGNESIUM OXIDE 400 (241.3 MG) MG PO TABS
400.0000 mg | ORAL_TABLET | Freq: Two times a day (BID) | ORAL | Status: AC
  Administered 2019-05-24 (×2): 400 mg via ORAL
  Filled 2019-05-24 (×2): qty 1

## 2019-05-24 MED ORDER — INSULIN GLARGINE 100 UNIT/ML ~~LOC~~ SOLN: 5.0000 [IU] | Freq: Two times a day (BID) | SUBCUTANEOUS | Status: DC

## 2019-05-24 MED ORDER — INSULIN DETEMIR 100 UNIT/ML ~~LOC~~ SOLN
5.0000 [IU] | Freq: Two times a day (BID) | SUBCUTANEOUS | Status: DC
  Administered 2019-05-24 – 2019-05-25 (×3): 5 [IU] via SUBCUTANEOUS
  Filled 2019-05-24 (×4): qty 0.05

## 2019-05-24 MED ORDER — VITAMIN B-1 100 MG PO TABS
100.0000 mg | ORAL_TABLET | Freq: Every day | ORAL | Status: DC
  Administered 2019-05-24 – 2019-05-30 (×7): 100 mg via ORAL
  Filled 2019-05-24 (×7): qty 1

## 2019-05-24 MED ORDER — HYDROCOD POLST-CPM POLST ER 10-8 MG/5ML PO SUER
5.0000 mL | Freq: Two times a day (BID) | ORAL | Status: DC | PRN
  Administered 2019-05-25 – 2019-05-27 (×3): 5 mL via ORAL
  Filled 2019-05-24 (×3): qty 5

## 2019-05-24 MED ORDER — THIAMINE HCL 100 MG/ML IJ SOLN
100.0000 mg | Freq: Every day | INTRAMUSCULAR | Status: DC
  Filled 2019-05-24: qty 2

## 2019-05-24 MED ORDER — DEXTROSE 5 % IV SOLN
INTRAVENOUS | Status: AC
  Administered 2019-05-24: 17:00:00 via INTRAVENOUS

## 2019-05-24 MED ORDER — INSULIN ASPART 100 UNIT/ML ~~LOC~~ SOLN
0.0000 [IU] | SUBCUTANEOUS | Status: DC
  Administered 2019-05-24: 06:00:00 3 [IU] via SUBCUTANEOUS

## 2019-05-24 MED ORDER — POTASSIUM CHLORIDE CRYS ER 20 MEQ PO TBCR
40.0000 meq | EXTENDED_RELEASE_TABLET | Freq: Once | ORAL | Status: AC
  Administered 2019-05-24: 40 meq via ORAL
  Filled 2019-05-24: qty 2

## 2019-05-24 MED ORDER — ONDANSETRON HCL 4 MG PO TABS: 4.0000 mg | ORAL_TABLET | Freq: Four times a day (QID) | ORAL | Status: DC | PRN

## 2019-05-24 MED ORDER — LORAZEPAM 0.5 MG PO TABS: 1.0000 mg | ORAL_TABLET | Freq: Four times a day (QID) | ORAL | Status: AC | PRN

## 2019-05-24 MED ORDER — POTASSIUM CHLORIDE CRYS ER 20 MEQ PO TBCR
40.0000 meq | EXTENDED_RELEASE_TABLET | Freq: Once | ORAL | Status: AC
  Administered 2019-05-24: 06:00:00 40 meq via ORAL
  Filled 2019-05-24: qty 2

## 2019-05-24 MED ORDER — ADULT MULTIVITAMIN W/MINERALS CH
1.0000 | ORAL_TABLET | Freq: Every day | ORAL | Status: DC
  Administered 2019-05-24 – 2019-05-30 (×7): 1 via ORAL
  Filled 2019-05-24 (×7): qty 1

## 2019-05-24 NOTE — Progress Notes (Signed)
Spoke to his daughter about his status and fevers at this time no other questions except to be called when he is to be discharged.

## 2019-05-24 NOTE — Progress Notes (Addendum)
[facility] PROGRESS NOTE    Progress Note  Bertell MariaJulio Sosa  ZOX:[id number] DOB: 12/24/1965 DOA: 05/24/2019 PCP: Patient, No Pcp Per     Brief Narrative:   Bertell MariaJulio Sosa is an 53 y.o. male no significant past medical history who presents with 5 to 6 days unable to keep anything down, low-grade fever and nausea and vomiting  in ER with a chest x-ray showed bilateral infiltrates tested COVID positive and when sent to the ED.  He was given 2 L of normal saline and started on the Ativan protocol for withdrawal.   Assessment/Plan:   Pneumonia due to COVID-19 virus: Remdesivir was held due to his elevated LFTs. Currently satting > 95% on 2L. He was started on IV steroids.  Patient remained greater than 94% on room air. Blood cultures are negative till date. Procalcitonin is 0.6, discontinue IV antibiotics Cannot use IV Remdesivir due to his elevated liver enzymes greater than 220. Inflammatory markers are elevated, continue to trend closely  Hyponatremia He is hyponatremic and hypochloremic with a creatinine of 1 likely due to hypovolemia.  We will try to correct no more than 10 mEq in a 24-hour period. Discontinue IV fluids. Check a basic metabolic panel this afternoon.  Transaminitis: Multifactorial in the setting of alcohol abuse and COVID-19 will continue to trend.  Alcohol dependence with uncomplicated withdrawal (HCC) Patient relates drinking beer daily on weekends denies any history of alcohol withdrawal. He was started on the Ativan protocol, he was supplemented with thiamine and folate. No signs of withdrawal.  Hypokalemia: Likely due to hypovolemia levels, replete both of them orally recheck in the morning.  Hyperglycemia due to steroids: Start him on sliding scale insulin plus long-acting.  Check an A1c  DVT prophylaxis: lovenxo Family Communication:none Disposition Plan/Barrier to D/C: once off oxygen Code Status:     Code Status Orders  (From admission, onward)         Start     Ordered   05/24/19 0511  Full code  Continuous     05/24/19 0512        Code Status History    This patient has a current code status but no historical code status.   Advance Care Planning Activity        IV Access:    Peripheral IV   Procedures and diagnostic studies:   Dg Chest 2 View  Result Date: 05/23/2019 CLINICAL DATA:  Cough EXAM: CHEST - 2 VIEW COMPARISON:  None. FINDINGS: There are multifocal airspace opacities bilaterally. No pneumothorax. No large pleural effusion. The heart size is enlarged. There is no acute osseous abnormality detected on this exam. IMPRESSION: Multifocal airspace opacities concerning for multifocal pneumonia (viral or bacterial). Electronically Signed   By: Katherine Mantlehristopher  Green M.D.   On: 05/23/2019 17:47     Medical Consultants:    None.  Anti-Infectives:   none  Subjective:    Bertell MariaJulio Sosa he relates his breathing is better compared to yesterday.  His appetite has returned.  Objective:    Vitals:   05/24/19 0507  BP: 138/84  Pulse: (!) 58  Resp: 17  Temp: 97.6 F (36.4 C)  TempSrc: Oral  SpO2: 98%  Weight: 86 kg  Height: 5\' 9"  (1.753 m)   No intake or output data in the 24 hours ending 05/24/19 0647 Filed Weights   05/24/19 0507  Weight: 86 kg    Exam: General exam: In no acute distress. Respiratory system: Good air movement and crackles at bases bilaterally. Cardiovascular  system: S1 & S2 heard, RRR. No JVD. Gastrointestinal system: Abdomen is nondistended, soft and nontender.  Central nervous system: Alert and oriented. No focal neurological deficits. Extremities: No pedal edema. Skin: No rashes, lesions or ulcers Psychiatry: Judgement and insight appear normal. Mood & affect appropriate.     Data Reviewed:    Labs: Basic Metabolic Panel: Recent Labs  Lab 05/23/19 1905  NA 125*  K 3.3*  CL 88*  CO2 22  GLUCOSE 267*  BUN 18  CREATININE 1.05  CALCIUM 8.3*   GFR Estimated  Creatinine Clearance: 90.4 mL/min (by C-G formula based on SCr of 1.05 mg/dL). Liver Function Tests: Recent Labs  Lab 05/23/19 1905  AST 303*  ALT 112*  ALKPHOS 142*  BILITOT 1.6*  PROT 8.1  ALBUMIN 3.5   Recent Labs  Lab 05/23/19 1905  LIPASE 59*   No results for input(s): AMMONIA in the last 168 hours. Coagulation profile No results for input(s): INR, PROTIME in the last 168 hours. COVID-19 Labs  No results for input(s): DDIMER, FERRITIN, LDH, CRP in the last 72 hours.  Lab Results  Component Value Date   SARSCOV2NAA POSITIVE (A) 05/23/2019    CBC: Recent Labs  Lab 05/23/19 1905  WBC 6.6  HGB 16.6  HCT 45.0  MCV 92.0  PLT 146*   Cardiac Enzymes: No results for input(s): CKTOTAL, CKMB, CKMBINDEX, TROPONINI in the last 168 hours. BNP (last 3 results) No results for input(s): PROBNP in the last 8760 hours. CBG: Recent Labs  Lab 05/24/19 0549  GLUCAP 227*   D-Dimer: No results for input(s): DDIMER in the last 72 hours. Hgb A1c: No results for input(s): HGBA1C in the last 72 hours. Lipid Profile: No results for input(s): CHOL, HDL, LDLCALC, TRIG, CHOLHDL, LDLDIRECT in the last 72 hours. Thyroid function studies: No results for input(s): TSH, T4TOTAL, T3FREE, THYROIDAB in the last 72 hours.  Invalid input(s): FREET3 Anemia work up: No results for input(s): VITAMINB12, FOLATE, FERRITIN, TIBC, IRON, RETICCTPCT in the last 72 hours. Sepsis Labs: Recent Labs  Lab 05/23/19 1905 05/24/19 0029  WBC 6.6  --   LATICACIDVEN  --  1.1   Microbiology Recent Results (from the past 240 hour(s))  SARS Coronavirus 2 (CEPHEID- Performed in [place] hospital lab), Hosp Order     Status: Abnormal   Collection Time: 05/23/19 10:38 PM   Specimen: Nasopharyngeal Swab  Result Value Ref Range Status   SARS Coronavirus 2 POSITIVE (A) NEGATIVE Final    Comment: RESULT CALLED TO, READ BACK BY AND VERIFIED WITH: GRACIE WINEMAN ON 05/23/2019 AT 2351 QSD (NOTE) If result  is NEGATIVE SARS-CoV-2 target nucleic acids are NOT DETECTED. The SARS-CoV-2 RNA is generally detectable in upper and lower  respiratory specimens during the acute phase of infection. The lowest  concentration of SARS-CoV-2 viral copies this assay can detect is 250  copies / mL. A negative result does not preclude SARS-CoV-2 infection  and should not be used as the sole basis for treatment or other  patient management decisions.  A negative result may occur with  improper specimen collection / handling, submission of specimen other  than nasopharyngeal swab, presence of viral mutation(s) within the  areas targeted by this assay, and inadequate number of viral copies  (<250 copies / mL). A negative result must be combined with clinical  observations, patient history, and epidemiological information. If result is POSITIVE SARS-CoV-2 target nucleic acids are DETECTED.  The SARS-CoV-2 RNA is generally detectable in upper and lower  respiratory specimens during the acute phase of infection.  Positive  results are indicative of active infection with SARS-CoV-2.  Clinical  correlation with patient history and other diagnostic information is  necessary to determine patient infection status.  Positive results do  not rule out bacterial infection or co-infection with other viruses. If result is PRESUMPTIVE POSTIVE SARS-CoV-2 nucleic acids MAY BE PRESENT.   A presumptive positive result was obtained on the submitted specimen  and confirmed on repeat testing.  While 2019 novel coronavirus  (SARS-CoV-2) nucleic acids may be present in the submitted sample  additional confirmatory testing may be necessary for epidemiological  and / or clinical management purposes  to differentiate between  SARS-CoV-2 and other Sarbecovirus currently known to infect humans.  If clinically indicated additional testing with an alternate test  methodology [phone]([id number]  is advised. The SARS-CoV-2 RNA is generally   detectable in upper and lower respiratory specimens during the acute  phase of infection. The expected result is Negative. Fact Sheet for Patients:  BoilerBrush.com.cyhttps://www.fda.gov/media/136312/download Fact Sheet for Healthcare Providers: https://pope.com/https://www.fda.gov/media/136313/download This test is not yet approved or cleared by the [place] FDA and has been authorized for detection and/or diagnosis of SARS-CoV-2 by FDA under an Emergency Use Authorization (EUA).  This EUA will remain in effect (meaning this test can be used) for the duration of the COVID-19 declaration under Section 564(b)(1) of the Act, 21 U.S.C. section 360bbb-3(b)(1), unless the authorization is terminated or revoked sooner. Performed at [facility], [address] [place], [place] [address]      Medications:   . enoxaparin (LOVENOX) injection  40 mg Subcutaneous Q24H  . folic acid  1 mg Oral Daily  . insulin aspart  0-9 Units Subcutaneous Q4H  . multivitamin with minerals  1 tablet Oral Daily  . thiamine  100 mg Oral Daily   Or  . thiamine  100 mg Intravenous Daily  . vitamin C  500 mg Oral Daily  . zinc sulfate  220 mg Oral Daily   Continuous Infusions: . sodium chloride       LOS: 1 day   Marinda ElkAbraham Feliz Ortiz  [facility]  05/24/2019, 6:47 AM

## 2019-05-24 NOTE — Telephone Encounter (Signed)
-----   Message from Karen Kitchens, NP sent at 05/23/2019  6:18 PM EDT ----- Disregard test request. He is going to ED from clinic. Will have testing done there.   Thanks,  Gaspar Bidding

## 2019-05-24 NOTE — Progress Notes (Signed)
Pt arrived to unit from [facility] ED.  MD paged upon arrival

## 2019-05-24 NOTE — Plan of Care (Signed)
Discussed with patient plan of care for the evening, pain management and encouraging incentive spirometry with some teach back displayed at this time.

## 2019-05-24 NOTE — H&P (Signed)
History and Physical  Patient Name: Shawn Torres     ZOX:[id number]    DOB: 10/31/1966    DOA: 05/24/2019 PCP: Patient, No Pcp Per  Patient coming from: [place] [facility]  Chief Complaint: Vomiting, dehydration      HPI: Shawn Torres is a 53 y.o. M with no significant PMH who presents with 5-6 days of malaise, now unable to keep any food down.  All history collected through video phonic interpreter.  The patient was in his usual state of health, until about 5 or 6 days ago when he started to develop malaise, fatigue, aches, and low-grade fevers.  In the last 4 days, this is progressed until he vomits frequently, cannot keep any food or liquids down so he went to UC.  No dyspnea with exertion, cough mild, no abdominal pain.  From UC he was noted to desat to 88% with exertion, so was sent to the ER, where chest x-ray showed bilateral opacities.  He had temp 100.83F, HR 105, Na 125, K 3.3.  COVID+  He was given 2.5L IV fluids, Ativan for alcohol withdrawal, CTX and azithromcyin for empiric coverage of CAP and [facility] in transfer.       ROS: Review of Systems  Constitutional: Positive for fever and malaise/fatigue. Negative for chills.  Respiratory: Positive for cough. Negative for sputum production and shortness of breath.   Gastrointestinal: Positive for nausea and vomiting. Negative for abdominal pain and diarrhea.  All other systems reviewed and are negative.         History reviewed. No pertinent past medical history.  History reviewed. No pertinent surgical history.  Social History: Patient works in [employer].  The patient walks unassisted.  Nonsmoker.  Drinks six 12 oz beers per day on the weekend.   No Known Allergies  Family history: Specifically denies any family history of hypertension, diabetes, heart disease, liver disease, lung disease, kidney disease, or cancer. Family history is unknown by patient.  Prior to Admission  medications   None       Physical Exam: BP 138/84 (BP Location: Left Arm)   Pulse (!) 58   Temp 97.6 F (36.4 C) (Oral)   Resp 17   Ht 5\' 9"  (1.753 m)   Wt 86 kg   SpO2 98%   BMI 27.98 kg/m  General appearance: Well-developed, adult male, alert and in no acute distress.   Eyes: Anicteric, conjunctiva pink, lids and lashes normal. PERRL.    ENT: No nasal deformity, discharge, epistaxis.  Hearing normal. OP moist without lesions.   Neck: No neck masses.  Trachea midline.  No thyromegaly/tenderness. Lymph: No cervical or supraclavicular lymphadenopathy. Skin: Warm and dry, flushed.  No jaundice.  No suspicious rashes or lesions. Cardiac: RRR, nl S1-S2, no murmurs appreciated.  Capillary refill is brisk.  JVP normal.  No LE edema.  Radial pulses 2+ and symmetric. Respiratory: Normal respiratory rate and rhythm.  CTAB without rales or wheezes. Abdomen: Abdomen soft.  No epigastric TTP or guarding. No ascites, distension, hepatosplenomegaly.   MSK: No deformities or effusions of the large joints of the upper or lower extremities bilaterally.  No cyanosis or clubbing. Neuro: Cranial nerves normal.  Sensation intact to light touch. Speech is fluent.  Muscle strength normal and symmetric.    Psych: Sensorium intact and responding to questions, attention normal.  Behavior appropriate.  Affect normal.  Judgment and insight appear normal.     Labs on Admission:  I have  personally reviewed following labs and imaging studies: CBC: Recent Labs  Lab 05/23/19 1905  WBC 6.6  HGB 16.6  HCT 45.0  MCV 92.0  PLT 146*   Basic Metabolic Panel: Recent Labs  Lab 05/23/19 1905  NA 125*  K 3.3*  CL 88*  CO2 22  GLUCOSE 267*  BUN 18  CREATININE 1.05  CALCIUM 8.3*   GFR: Estimated Creatinine Clearance: 90.4 mL/min (by C-G formula based on SCr of 1.05 mg/dL).  Liver Function Tests: Recent Labs  Lab 05/23/19 1905  AST 303*  ALT 112*  ALKPHOS 142*  BILITOT 1.6*  PROT 8.1  ALBUMIN  3.5   Recent Labs  Lab 05/23/19 1905  LIPASE 59*    CBG: Recent Labs  Lab 05/24/19 0549  GLUCAP 227*     Recent Results (from the past 240 hour(s))  SARS Coronavirus 2 (CEPHEID- Performed in [facility] lab), Hosp Order     Status: Abnormal   Collection Time: 05/23/19 10:38 PM   Specimen: Nasopharyngeal Swab  Result Value Ref Range Status   SARS Coronavirus 2 POSITIVE (A) NEGATIVE Final    Comment: RESULT CALLED TO, READ BACK BY AND VERIFIED WITH: GRACIE WINEMAN ON 05/23/2019 AT 2351 QSD (NOTE) If result is NEGATIVE SARS-CoV-2 target nucleic acids are NOT DETECTED. The SARS-CoV-2 RNA is generally detectable in upper and lower  respiratory specimens during the acute phase of infection. The lowest  concentration of SARS-CoV-2 viral copies this assay can detect is 250  copies / mL. A negative result does not preclude SARS-CoV-2 infection  and should not be used as the sole basis for treatment or other  patient management decisions.  A negative result may occur with  improper specimen collection / handling, submission of specimen other  than nasopharyngeal swab, presence of viral mutation(s) within the  areas targeted by this assay, and inadequate number of viral copies  (<250 copies / mL). A negative result must be combined with clinical  observations, patient history, and epidemiological information. If result is POSITIVE SARS-CoV-2 target nucleic acids are DETECTED.  The SARS-CoV-2 RNA is generally detectable in upper and lower  respiratory specimens during the acute phase of infection.  Positive  results are indicative of active infection with SARS-CoV-2.  Clinical  correlation with patient history and other diagnostic information is  necessary to determine patient infection status.  Positive results do  not rule out bacterial infection or co-infection with other viruses. If result is PRESUMPTIVE POSTIVE SARS-CoV-2 nucleic acids MAY BE PRESENT.   A presumptive  positive result was obtained on the submitted specimen  and confirmed on repeat testing.  While 2019 novel coronavirus  (SARS-CoV-2) nucleic acids may be present in the submitted sample  additional confirmatory testing may be necessary for epidemiological  and / or clinical management purposes  to differentiate between  SARS-CoV-2 and other Sarbecovirus currently known to infect humans.  If clinically indicated additional testing with an alternate test  methodology [phone]  is advised. The SARS-CoV-2 RNA is generally  detectable in upper and lower respiratory specimens during the acute  phase of infection. The expected result is Negative. Fact Sheet for Patients:  BoilerBrush.com.cyhttps://www.fda.gov/media/136312/download Fact Sheet for Healthcare Providers: https://pope.com/https://www.fda.gov/media/136313/download This test is not yet approved or cleared by the [place] FDA and has been authorized for detection and/or diagnosis of SARS-CoV-2 by FDA under an Emergency Use Authorization (EUA).  This EUA will remain in effect (meaning this test can be used) for the duration of the COVID-19 declaration  under Section 564(b)(1) of the Act, 21 U.S.C. section 360bbb-3(b)(1), unless the authorization is terminated or revoked sooner. Performed at [facility], [address] [place], [place] [address]            Radiological Exams on Admission: Personally reviewed chest x-ray shows bilateral opacities: Dg Chest 2 View  Result Date: 05/23/2019 CLINICAL DATA:  Cough EXAM: CHEST - 2 VIEW COMPARISON:  None. FINDINGS: There are multifocal airspace opacities bilaterally. No pneumothorax. No large pleural effusion. The heart size is enlarged. There is no acute osseous abnormality detected on this exam. IMPRESSION: Multifocal airspace opacities concerning for multifocal pneumonia (viral or bacterial). Electronically Signed   By: Constance Holster M.D.   On: 05/23/2019 17:47           Assessment/Plan     Coronavirus pneumonitis  Dehdyration In setting of ongoing 2020 COVID-19 pandemic.  Remdesivir held given LFTs >5xULN.  Steroids held given patient has minimal O2 requirements.    -Trial wean o2 daily -IV fluids  -Trial soft diet  -VTE PPx with Lovenox -Continue Zinc and Vitamin C -Daily d-dimer, ferritin and CRP    Alcohol use Patient reports 612 ounce beers daily on the weekends, denies ever having history of alcohol withdrawal symptoms. - CIWA scoring with on-demand lorazepam for 24 hours, will discontinue no lorazepam needed -Thiamine and folate  Hyponatremia Given history, likely hypovolemic. 2.5 L normal saline in the ER - Repeat sodium now  Transaminitis Possibly from alcohol use.  Also possibly from [place]. -Trend LFTs  Hypokalemia -Check mag -Replace potassium       DVT prophylaxis: Lovenox  Code Status: FULL  Family Communication: None  Disposition Plan: Anticipate IV fluids and advance diet if able.  If able to take PO by tomorrow and respiraotry status able to wean off O2, without worsening oxygenation, likely home. Consults called: None Admission status: OBS   At the point of initial evaluation, it is my clinical opinion that admission for OBSERVATION is reasonable and necessary because the patient's presenting complaints in the context of their chronic conditions represent sufficient risk of deterioration or significant morbidity to constitute reasonable grounds for close observation in the hospital setting, but that the patient may be medically stable for discharge from the hospital within 24 to 48 hours.      Medical decision making: Patient seen at 6:13 AM on 05/24/2019.  The patient was discussed with Dr. Beather Arbour from [facility].  What exists of the patient's chart was reviewed in depth and summarized above.  Clinical condition: stable on supplemental O2 and IV fluids.        [place] [facility] Pager: please page via [email or web address]

## 2019-05-24 NOTE — Progress Notes (Signed)
Inpatient Diabetes Program Recommendations  AACE/ADA: New Consensus Statement on Inpatient Glycemic Control (2015)  Target Ranges:  Prepandial:   less than 140 mg/dL      Peak postprandial:   less than 180 mg/dL (1-2 hours)      Critically ill patients:  140 - 180 mg/dL   Lab Results  Component Value Date   GLUCAP 199 (H) 05/24/2019   HGBA1C 10.8 (H) 05/24/2019    Review of Glycemic Control Results for KRISTAIN, HU (MRN [id number]) as of 05/24/2019 15:34  Ref. Range 05/24/2019 05:49 05/24/2019 07:52 05/24/2019 11:41  Glucose-Capillary Latest Ref Range: 70 - 99 mg/dL 227 (H) 199 (H) 199 (H)   Diabetes history: DM 2 (new diabetes) Outpatient Diabetes medications: None Current orders for Inpatient glycemic control:  Novolog  Sensitive tid with meals and HS Novolog 3 units tid with meals Levemir 5 units q HS Inpatient Diabetes Program Recommendations:     New diagnosis of DM.  Using Interpreter line called patient to discuss.  He was very engaged in conversation and asked great questions.  Spoke with pt about new diagnosis.  Discussed A1C results with him and explained what an A1C is, basic pathophysiology of DM Type 2, basic home care, importance of checking CBGs and maintaining good CBG control to prevent long-term and short-term complications.  Reviewed signs and symptoms of hyperglycemia and hypoglycemia.  RNs to provide ongoing basic DM education at bedside with this patient.  Have ordered educational booklet for patient in Spanish.  Used teach back to ensure understanding. He was able to teach back normal blood sugars of 80-120, how to treat a low blood sugar and the importance of f/u with MD.  May be able to d/c home with only oral agent (such as Metformin), however will ask RN to provide insulin teaching at bedside just in case.  Patient states that he drinks water and Coke- encouraged him to eliminate sugar from beverages.  We also discussed that high carbohydrate foods such as tortillas,  bread, potatoes and fruits will raise blood sugars.  Explained that he can still have these foods but he needs to reduce amounts.  Encouraged increased non-starchy veggies.    Will follow.   Thanks  Adah Perl, RN, BC-ADM Inpatient Diabetes Coordinator Pager [phone] (8a-5p)

## 2019-05-24 NOTE — Progress Notes (Signed)
CODE SEPSIS - PHARMACY COMMUNICATION  **Broad Spectrum Antibiotics should be administered within 1 hour of Sepsis diagnosis**  Time Code Sepsis Called/Page Received: 2315  Antibiotics Ordered: azithro/ceftriaxone  Time of 1st antibiotic administration: 0025  Additional action taken by pharmacy:   If necessary, Name of Provider/Nurse Contacted:     Tobie Lords ,PharmD Clinical Pharmacist  05/24/2019  2:19 AM

## 2019-05-24 NOTE — ED Notes (Signed)
Pt placed on 2L at this time 

## 2019-05-24 NOTE — ED Notes (Signed)
Pt gives verbal consent for transfer 

## 2019-05-25 DIAGNOSIS — R740 Nonspecific elevation of levels of transaminase and lactic acid dehydrogenase [LDH]: Secondary | ICD-10-CM | POA: Diagnosis present

## 2019-05-25 DIAGNOSIS — F10230 Alcohol dependence with withdrawal, uncomplicated: Secondary | ICD-10-CM | POA: Diagnosis present

## 2019-05-25 DIAGNOSIS — E876 Hypokalemia: Secondary | ICD-10-CM | POA: Diagnosis present

## 2019-05-25 DIAGNOSIS — U071 COVID-19: Secondary | ICD-10-CM | POA: Diagnosis present

## 2019-05-25 DIAGNOSIS — J1289 Other viral pneumonia: Secondary | ICD-10-CM | POA: Diagnosis present

## 2019-05-25 DIAGNOSIS — E878 Other disorders of electrolyte and fluid balance, not elsewhere classified: Secondary | ICD-10-CM | POA: Diagnosis present

## 2019-05-25 DIAGNOSIS — J9601 Acute respiratory failure with hypoxia: Secondary | ICD-10-CM | POA: Diagnosis present

## 2019-05-25 DIAGNOSIS — E871 Hypo-osmolality and hyponatremia: Secondary | ICD-10-CM | POA: Diagnosis present

## 2019-05-25 DIAGNOSIS — E1165 Type 2 diabetes mellitus with hyperglycemia: Secondary | ICD-10-CM | POA: Diagnosis present

## 2019-05-25 DIAGNOSIS — E861 Hypovolemia: Secondary | ICD-10-CM | POA: Diagnosis present

## 2019-05-25 LAB — CBC WITH DIFFERENTIAL/PLATELET
Abs Immature Granulocytes: 0.02 10*3/uL (ref 0.00–0.07)
Basophils Absolute: 0 10*3/uL (ref 0.0–0.1)
Basophils Relative: 1 %
Eosinophils Absolute: 0 10*3/uL (ref 0.0–0.5)
Eosinophils Relative: 0 %
HCT: 46.1 % (ref 39.0–52.0)
Hemoglobin: 16.3 g/dL (ref 13.0–17.0)
Immature Granulocytes: 0 %
Lymphocytes Relative: 36 %
Lymphs Abs: 2.3 10*3/uL (ref 0.7–4.0)
MCH: 34.3 pg — ABNORMAL HIGH (ref 26.0–34.0)
MCHC: 35.4 g/dL (ref 30.0–36.0)
MCV: 97.1 fL (ref 80.0–100.0)
Monocytes Absolute: 0.5 10*3/uL (ref 0.1–1.0)
Monocytes Relative: 7 %
Neutro Abs: 3.7 10*3/uL (ref 1.7–7.7)
Neutrophils Relative %: 56 %
Platelets: 142 10*3/uL — ABNORMAL LOW (ref 150–400)
RBC: 4.75 MIL/uL (ref 4.22–5.81)
RDW: 11.9 % (ref 11.5–15.5)
WBC: 6.5 10*3/uL (ref 4.0–10.5)
nRBC: 0 % (ref 0.0–0.2)

## 2019-05-25 LAB — GLUCOSE, CAPILLARY
Glucose-Capillary: 133 mg/dL — ABNORMAL HIGH (ref 70–99)
Glucose-Capillary: 119 mg/dL — ABNORMAL HIGH (ref 70–99)
Glucose-Capillary: 200 mg/dL — ABNORMAL HIGH (ref 70–99)
Glucose-Capillary: 303 mg/dL — ABNORMAL HIGH (ref 70–99)

## 2019-05-25 LAB — COMPREHENSIVE METABOLIC PANEL WITH GFR
ALT: 87 U/L — ABNORMAL HIGH (ref 0–44)
AST: 182 U/L — ABNORMAL HIGH (ref 15–41)
Albumin: 3 g/dL — ABNORMAL LOW (ref 3.5–5.0)
Alkaline Phosphatase: 125 U/L (ref 38–126)
Anion gap: 13 (ref 5–15)
BUN: 13 mg/dL (ref 6–20)
CO2: 27 mmol/L (ref 22–32)
Calcium: 8.1 mg/dL — ABNORMAL LOW (ref 8.9–10.3)
Chloride: 93 mmol/L — ABNORMAL LOW (ref 98–111)
Creatinine, Ser: 0.91 mg/dL (ref 0.61–1.24)
GFR calc Af Amer: >60 mL/min
GFR calc non Af Amer: >60 mL/min
Glucose, Bld: 95 mg/dL (ref 70–99)
Potassium: 3.2 mmol/L — ABNORMAL LOW (ref 3.5–5.1)
Sodium: 133 mmol/L — ABNORMAL LOW (ref 135–145)
Total Bilirubin: 1 mg/dL (ref 0.3–1.2)
Total Protein: 7.4 g/dL (ref 6.5–8.1)

## 2019-05-25 LAB — D-DIMER, QUANTITATIVE: D-Dimer, Quant: 1.08 ug{FEU}/mL — ABNORMAL HIGH (ref 0.00–0.50)

## 2019-05-25 LAB — C-REACTIVE PROTEIN: CRP: 6.9 mg/dL — ABNORMAL HIGH

## 2019-05-25 LAB — FERRITIN: Ferritin: 897 ng/mL — ABNORMAL HIGH (ref 24–336)

## 2019-05-25 MED ORDER — POTASSIUM CHLORIDE CRYS ER 20 MEQ PO TBCR
40.0000 meq | EXTENDED_RELEASE_TABLET | Freq: Three times a day (TID) | ORAL | Status: AC
  Administered 2019-05-25 (×3): 40 meq via ORAL
  Filled 2019-05-25 (×3): qty 2

## 2019-05-25 MED ORDER — METHYLPREDNISOLONE SODIUM SUCC 40 MG IJ SOLR
40.0000 mg | Freq: Two times a day (BID) | INTRAMUSCULAR | Status: DC
  Administered 2019-05-25 – 2019-05-30 (×10): 40 mg via INTRAVENOUS
  Filled 2019-05-25 (×11): qty 1

## 2019-05-25 MED ORDER — MAGNESIUM OXIDE 400 (241.3 MG) MG PO TABS
400.0000 mg | ORAL_TABLET | Freq: Two times a day (BID) | ORAL | Status: AC
  Administered 2019-05-25 (×2): 400 mg via ORAL
  Filled 2019-05-25 (×2): qty 1

## 2019-05-25 NOTE — Plan of Care (Signed)
Discussed with patient plan of care for the evening, pain management and encouraged using incentive spirometry with some teach back displayed.  Per the patient he will update his family tonight.

## 2019-05-25 NOTE — Progress Notes (Signed)
[facility] PROGRESS NOTE    Progress Note  Shawn Torres  LKG:[id number] DOB: 07/20/1966 DOA: 05/24/2019 PCP: Patient, No Pcp Per     Brief Narrative:   Shawn Torres is an 53 y.o. male no significant past medical history who presents with 5 to 6 days unable to keep anything down, low-grade fever and nausea and vomiting  in ER with a chest x-ray showed bilateral infiltrates tested COVID positive and when sent to the ED.  He was given 2 L of normal saline and started on the Ativan protocol for withdrawal.  05/25/2019 IV steroids  Assessment/Plan:   Pneumonia due to COVID-19 virus: Remdesivir was held due to his elevated LFT's. Start IV steroids Blood cultures are negative till date. Inflammatory markers are elevated, continue to trend closely. He is currently not hypoxic so he does not qualify for IV Remdesivir.  On admission his LFTs were greater than 200s so IV Remdesivir could not be started.  Hyponatremia He is hyponatremic and hypochloremic with a creatinine of 1 likely due to hypovolemia.  We will try to correct no more than 10 mEq in a 24-hour period. Discontinue IV fluids his sodium today is 133.  He is currently taking oral intakes.  Transaminitis: Likely due to COVID-19 to have improved significantly.  Alcohol dependence with uncomplicated withdrawal (HCC) Signs of withdrawal continue thiamine and folate.  Hypokalemia: Replete orally aggressively recheck in the morning. Replete magnesium orally.  Newly diagnosed diabetes mellitus type 2: A1c of 10.8, improved control with long-acting insulin and sliding scale in the setting of steroids.  He will need to go home on an oral hypoglycemic agent.  DVT prophylaxis: lovenxo Family Communication:none Disposition Plan/Barrier to D/C: In 3 to 4 days. Code Status:     Code Status Orders  (From admission, onward)         Start     Ordered   05/24/19 0511  Full code  Continuous     05/24/19 0512        Code Status  History    This patient has a current code status but no historical code status.   Advance Care Planning Activity        IV Access:    Peripheral IV   Procedures and diagnostic studies:   Dg Chest 2 View  Result Date: 05/23/2019 CLINICAL DATA:  Cough EXAM: CHEST - 2 VIEW COMPARISON:  None. FINDINGS: There are multifocal airspace opacities bilaterally. No pneumothorax. No large pleural effusion. The heart size is enlarged. There is no acute osseous abnormality detected on this exam. IMPRESSION: Multifocal airspace opacities concerning for multifocal pneumonia (viral or bacterial). Electronically Signed   By: Katherine Mantlehristopher  Green M.D.   On: 05/23/2019 17:47     Medical Consultants:    None.  Anti-Infectives:   none  Subjective:    Shawn Torres relates his appetite has returned his breathing is better compared to yesterday.  Objective:    Vitals:   05/25/19 0400 05/25/19 0500 05/25/19 0510 05/25/19 0600  BP: 129/86   108/66  Pulse: 89 78 78 72  Resp:      Temp:      TempSrc:      SpO2: 93% 94% 95% 96%  Weight:      Height:        Intake/Output Summary (Last 24 hours) at 05/25/2019 0746 Last data filed at 05/25/2019 0600 Gross per 24 hour  Intake 972.55 ml  Output 750 ml  Net 222.55 ml   [employer]  05/24/19 0507  Weight: 86 kg    Exam: General exam: In no acute distress. Respiratory system: Good air movement with crackles at bases bilaterally. Cardiovascular system: S1 & S2 heard, RRR. No JVD. Gastrointestinal system: Abdomen is nondistended, soft and nontender.  Central nervous system: Alert and oriented. No focal neurological deficits. Extremities: No pedal edema. Skin: No rashes, lesions or ulcers Psychiatry: Judgement and insight appear normal. Mood & affect appropriate.    Data Reviewed:    Labs: Basic Metabolic Panel: Recent Labs  Lab 05/23/19 1905 05/24/19 0647 05/24/19 1507 05/25/19 0144  NA 125* 130* 135 133*  K 3.3* 3.2* 3.8 3.2*   CL 88* 97* 98 93*  CO2 22 26 25 27   GLUCOSE 267* 228* 166* 95  BUN 18 16 15 13   CREATININE 1.05 1.03 0.90 0.91  CALCIUM 8.3* 7.4* 7.8* 8.1*  MG  --  1.9  --   --    GFR Estimated Creatinine Clearance: 104.3 mL/min (by C-G formula based on SCr of 0.91 mg/dL). Liver Function Tests: Recent Labs  Lab 05/23/19 1905 05/24/19 0647 05/25/19 0144  AST 303* 269* 182*  ALT 112* 100* 87*  ALKPHOS 142* 113 125  BILITOT 1.6* 0.9 1.0  PROT 8.1 6.7 7.4  ALBUMIN 3.5 2.7* 3.0*   Recent Labs  Lab 05/23/19 1905  LIPASE 59*   No results for input(s): AMMONIA in the last 168 hours. Coagulation profile No results for input(s): INR, PROTIME in the last 168 hours. COVID-19 Labs  Recent Labs    05/24/19 0647 05/25/19 0144  DDIMER 0.91* 1.08*  FERRITIN 1,137* 897*  CRP 5.3* 6.9*    Lab Results  Component Value Date   SARSCOV2NAA POSITIVE (A) 05/23/2019    CBC: Recent Labs  Lab 05/23/19 1905 05/24/19 0647 05/25/19 0144  WBC 6.6 4.2 6.5  NEUTROABS  --  1.7 3.7  HGB 16.6 15.4 16.3  HCT 45.0 42.6 46.1  MCV 92.0 95.1 97.1  PLT 146* 114* 142*   Cardiac Enzymes: No results for input(s): CKTOTAL, CKMB, CKMBINDEX, TROPONINI in the last 168 hours. BNP (last 3 results) No results for input(s): PROBNP in the last 8760 hours. CBG: Recent Labs  Lab 05/24/19 0549 05/24/19 0752 05/24/19 1141 05/24/19 1609 05/24/19 2149  GLUCAP 227* 199* 199* 174* 111*   D-Dimer: Recent Labs    05/24/19 0647 05/25/19 0144  DDIMER 0.91* 1.08*   Hgb A1c: Recent Labs    05/24/19 0647  HGBA1C 10.8*   Lipid Profile: No results for input(s): CHOL, HDL, LDLCALC, TRIG, CHOLHDL, LDLDIRECT in the last 72 hours. Thyroid function studies: No results for input(s): TSH, T4TOTAL, T3FREE, THYROIDAB in the last 72 hours.  Invalid input(s): FREET3 Anemia work up: Recent Labs    05/24/19 0647 05/25/19 0144  FERRITIN 1,137* 897*   Sepsis Labs: Recent Labs  Lab 05/23/19 1905 05/24/19 0029  05/24/19 0647 05/25/19 0144  PROCALCITON  --   --  0.61  --   WBC 6.6  --  4.2 6.5  LATICACIDVEN  --  1.1  --   --    Microbiology Recent Results (from the past 240 hour(s))  SARS Coronavirus 2 (CEPHEID- Performed in [place] hospital lab), Hosp Order     Status: Abnormal   Collection Time: 05/23/19 10:38 PM   Specimen: Nasopharyngeal Swab  Result Value Ref Range Status   SARS Coronavirus 2 POSITIVE (A) NEGATIVE Final    Comment: RESULT CALLED TO, READ BACK BY AND VERIFIED WITH: GRACIE [facility]ue D [facility] ON 05/23/2019  AT 2351 QSD (NOTE) If result is NEGATIVE SARS-CoV-2 target nucleic acids are NOT DETECTED. The SARS-CoV-2 RNA is generally detectable in upper and lower  respiratory specimens during the acute phase of infection. The lowest  concentration of SARS-CoV-2 viral copies this assay can detect is 250  copies / mL. A negative result does not preclude SARS-CoV-2 infection  and should not be used as the sole basis for treatment or other  patient management decisions.  A negative result may occur with  improper specimen collection / handling, submission of specimen other  than nasopharyngeal swab, presence of viral mutation(s) within the  areas targeted by this assay, and inadequate number of viral copies  (<250 copies / mL). A negative result must be combined with clinical  observations, patient history, and epidemiological information. If result is POSITIVE SARS-CoV-2 target nucleic acids are DETECTED.  The SARS-CoV-2 RNA is generally detectable in upper and lower  respiratory specimens during the acute phase of infection.  Positive  results are indicative of active infection with SARS-CoV-2.  Clinical  correlation with patient history and other diagnostic information is  necessary to determine patient infection status.  Positive results do  not rule out bacterial infection or co-infection with other viruses. If result is PRESUMPTIVE POSTIVE SARS-CoV-2 nucleic acids MAY BE PRESENT.    A presumptive positive result was obtained on the submitted specimen  and confirmed on repeat testing.  While 2019 novel coronavirus  (SARS-CoV-2) nucleic acids may be present in the submitted sample  additional confirmatory testing may be necessary for epidemiological  and / or clinical management purposes  to differentiate between  SARS-CoV-2 and other Sarbecovirus currently known to infect humans.  If clinically indicated additional testing with an alternate test  methodology [phone]([id number])  is advised. The SARS-CoV-2 RNA is generally  detectable in upper and lower respiratory specimens during the acute  phase of infection. The expected result is Negative. Fact Sheet for Patients:  BoilerBrush.com.cyhttps://www.fda.gov/media/136312/download Fact Sheet for Healthcare Providers: https://pope.com/https://www.fda.gov/media/136313/download This test is not yet approved or cleared by the [place] FDA and has been authorized for detection and/or diagnosis of SARS-CoV-2 by FDA under an Emergency Use Authorization (EUA).  This EUA will remain in effect (meaning this test can be used) for the duration of the COVID-19 declaration under Section 564(b)(1) of the Act, 21 U.S.C. section 360bbb-3(b)(1), unless the authorization is terminated or revoked sooner. Performed at [facility], [address] [place], [place] [address]   Blood Culture (routine x 2)     Status: None (Preliminary result)   Collection Time: 05/24/19 12:29 AM   Specimen: BLOOD  Result Value Ref Range Status   Specimen Description BLOOD BLOOD RIGHT HAND  Final   Special Requests   Final    BOTTLES DRAWN AEROBIC AND ANAEROBIC Blood Culture adequate volume   Culture   Final    NO GROWTH 1 DAY Performed at [facility], [address] [place], [place] [address]    Report Status PENDING  Incomplete  Blood Culture (routine x 2)     Status: None (Preliminary result)   Collection Time: 05/24/19 12:29 AM   Specimen: BLOOD  Result Value  Ref Range Status   Specimen Description BLOOD RIGHT ANTECUBITAL  Final   Special Requests   Final    BOTTLES DRAWN AEROBIC AND ANAEROBIC Blood Culture adequate volume   Culture   Final    NO GROWTH 1 DAY Performed at [facility], [address] [place], [place]  [address]    Report Status PENDING  Incomplete     Medications:   . enoxaparin (LOVENOX) injection  40 mg Subcutaneous Q24H  . folic acid  1 mg Oral Daily  . insulin aspart  0-5 Units Subcutaneous QHS  . insulin aspart  0-9 Units Subcutaneous TID WC  . insulin aspart  3 Units Subcutaneous TID WC  . insulin detemir  5 Units Subcutaneous BID  . multivitamin with minerals  1 tablet Oral Daily  . potassium chloride  20 mEq Oral BID  . thiamine  100 mg Oral Daily   Or  . thiamine  100 mg Intravenous Daily  . vitamin C  500 mg Oral Daily  . zinc sulfate  220 mg Oral Daily   Continuous Infusions:    LOS: 1 day   Marinda ElkAbraham Feliz Ortiz  [facility]  05/25/2019, 7:46 AM

## 2019-05-26 LAB — CBC WITH DIFFERENTIAL/PLATELET
Abs Immature Granulocytes: 0.01 10*3/uL (ref 0.00–0.07)
Basophils Absolute: 0 10*3/uL (ref 0.0–0.1)
Basophils Relative: 0 %
Eosinophils Absolute: 0 10*3/uL (ref 0.0–0.5)
Eosinophils Relative: 0 %
HCT: 46.4 % (ref 39.0–52.0)
Hemoglobin: 17 g/dL (ref 13.0–17.0)
Immature Granulocytes: 0 %
Lymphocytes Relative: 21 %
Lymphs Abs: 1.1 10*3/uL (ref 0.7–4.0)
MCH: 35.5 pg — ABNORMAL HIGH (ref 26.0–34.0)
MCHC: 36.6 g/dL — ABNORMAL HIGH (ref 30.0–36.0)
MCV: 96.9 fL (ref 80.0–100.0)
Monocytes Absolute: 0.2 10*3/uL (ref 0.1–1.0)
Monocytes Relative: 4 %
Neutro Abs: 3.9 10*3/uL (ref 1.7–7.7)
Neutrophils Relative %: 75 %
Platelets: 152 10*3/uL (ref 150–400)
RBC: 4.79 MIL/uL (ref 4.22–5.81)
RDW: 12 % (ref 11.5–15.5)
WBC: 5.1 10*3/uL (ref 4.0–10.5)
nRBC: 0 % (ref 0.0–0.2)

## 2019-05-26 LAB — COMPREHENSIVE METABOLIC PANEL
ALT: 73 U/L — ABNORMAL HIGH (ref 0–44)
AST: 112 U/L — ABNORMAL HIGH (ref 15–41)
Albumin: 2.9 g/dL — ABNORMAL LOW (ref 3.5–5.0)
Alkaline Phosphatase: 140 U/L — ABNORMAL HIGH (ref 38–126)
Anion gap: 10 (ref 5–15)
BUN: 20 mg/dL (ref 6–20)
CO2: 21 mmol/L — ABNORMAL LOW (ref 22–32)
Calcium: 8.7 mg/dL — ABNORMAL LOW (ref 8.9–10.3)
Chloride: 101 mmol/L (ref 98–111)
Creatinine, Ser: 0.82 mg/dL (ref 0.61–1.24)
GFR calc Af Amer: >60 mL/min (ref >60)
GFR calc non Af Amer: >60 mL/min (ref >60)
Glucose, Bld: 333 mg/dL — ABNORMAL HIGH (ref 70–99)
Potassium: 4.3 mmol/L (ref 3.5–5.1)
Sodium: 132 mmol/L — ABNORMAL LOW (ref 135–145)
Total Bilirubin: 0.7 mg/dL (ref 0.3–1.2)
Total Protein: 7.6 g/dL (ref 6.5–8.1)

## 2019-05-26 LAB — D-DIMER, QUANTITATIVE: D-Dimer, Quant: 0.85 ug/mL-FEU — ABNORMAL HIGH (ref 0.00–0.50)

## 2019-05-26 LAB — GLUCOSE, CAPILLARY
Glucose-Capillary: 293 mg/dL — ABNORMAL HIGH (ref 70–99)
Glucose-Capillary: 364 mg/dL — ABNORMAL HIGH (ref 70–99)
Glucose-Capillary: 320 mg/dL — ABNORMAL HIGH (ref 70–99)
Glucose-Capillary: 266 mg/dL — ABNORMAL HIGH (ref 70–99)

## 2019-05-26 LAB — FERRITIN: Ferritin: 696 ng/mL — ABNORMAL HIGH (ref 24–336)

## 2019-05-26 LAB — C-REACTIVE PROTEIN: CRP: 10 mg/dL — ABNORMAL HIGH (ref <1.0)

## 2019-05-26 MED ORDER — INSULIN ASPART 100 UNIT/ML ~~LOC~~ SOLN
0.0000 [IU] | Freq: Three times a day (TID) | SUBCUTANEOUS | Status: DC
  Administered 2019-05-26: 11 [IU] via SUBCUTANEOUS
  Administered 2019-05-26: 20 [IU] via SUBCUTANEOUS
  Administered 2019-05-26: 17:00:00 15 [IU] via SUBCUTANEOUS
  Administered 2019-05-27: 7 [IU] via SUBCUTANEOUS
  Administered 2019-05-27: 15 [IU] via SUBCUTANEOUS
  Administered 2019-05-27: 20 [IU] via SUBCUTANEOUS
  Administered 2019-05-28: 18:00:00 11 [IU] via SUBCUTANEOUS
  Administered 2019-05-28: 20 [IU] via SUBCUTANEOUS
  Administered 2019-05-28: 7 [IU] via SUBCUTANEOUS
  Administered 2019-05-29: 3 [IU] via SUBCUTANEOUS
  Administered 2019-05-29: 4 [IU] via SUBCUTANEOUS
  Administered 2019-05-30: 20 [IU] via SUBCUTANEOUS

## 2019-05-26 MED ORDER — INSULIN ASPART 100 UNIT/ML ~~LOC~~ SOLN
6.0000 [IU] | Freq: Three times a day (TID) | SUBCUTANEOUS | Status: DC
  Administered 2019-05-26 – 2019-05-30 (×14): 6 [IU] via SUBCUTANEOUS

## 2019-05-26 MED ORDER — SODIUM CHLORIDE 0.9 % IV SOLN
200.0000 mg | Freq: Once | INTRAVENOUS | Status: AC
  Administered 2019-05-26: 200 mg via INTRAVENOUS
  Filled 2019-05-26: qty 40

## 2019-05-26 MED ORDER — SODIUM CHLORIDE 0.9 % IV SOLN
INTRAVENOUS | Status: DC
  Administered 2019-05-26: 09:00:00 via INTRAVENOUS

## 2019-05-26 MED ORDER — INSULIN DETEMIR 100 UNIT/ML ~~LOC~~ SOLN
15.0000 [IU] | Freq: Two times a day (BID) | SUBCUTANEOUS | Status: DC
  Administered 2019-05-26 (×2): 15 [IU] via SUBCUTANEOUS
  Filled 2019-05-26 (×3): qty 0.15

## 2019-05-26 MED ORDER — SODIUM CHLORIDE 0.9 % IV SOLN
100.0000 mg | INTRAVENOUS | Status: AC
  Administered 2019-05-27 – 2019-05-30 (×4): 100 mg via INTRAVENOUS
  Filled 2019-05-26 (×4): qty 20

## 2019-05-26 MED ORDER — INSULIN ASPART 100 UNIT/ML ~~LOC~~ SOLN
0.0000 [IU] | Freq: Every day | SUBCUTANEOUS | Status: DC
  Administered 2019-05-26: 3 [IU] via SUBCUTANEOUS
  Administered 2019-05-27: 2 [IU] via SUBCUTANEOUS
  Administered 2019-05-28: 4 [IU] via SUBCUTANEOUS

## 2019-05-26 NOTE — Plan of Care (Addendum)
Discussed with patient plan of care for the evening, pain management and importance of deep breathing and ambulation with some teach back displayed.  Patient updating his family hisself.

## 2019-05-26 NOTE — Plan of Care (Signed)
  Problem: Education: Goal: Knowledge of General Education information will improve Description: Including pain rating scale, medication(s)/side effects and non-pharmacologic comfort measures Outcome: Progressing   Problem: Health Behavior/Discharge Planning: Goal: Ability to manage health-related needs will improve Outcome: Progressing   Problem: Clinical Measurements: Goal: Will remain free from infection Outcome: Progressing Goal: Diagnostic test results will improve Outcome: Progressing Goal: Respiratory complications will improve Outcome: Progressing   Problem: Nutrition: Goal: Adequate nutrition will be maintained Outcome: Progressing   Problem: Safety: Goal: Ability to remain free from injury will improve Outcome: Progressing   Problem: Skin Integrity: Goal: Risk for impaired skin integrity will decrease Outcome: Progressing   Problem: Education: Goal: Ability to describe self-care measures that may prevent or decrease complications (Diabetes Survival Skills Education) will improve Outcome: Progressing Goal: Individualized Educational Video(s) Outcome: Progressing   Problem: Coping: Goal: Ability to adjust to condition or change in health will improve Outcome: Progressing   Problem: Fluid Volume: Goal: Ability to maintain a balanced intake and output will improve Outcome: Progressing   Problem: Health Behavior/Discharge Planning: Goal: Ability to identify and utilize available resources and services will improve Outcome: Progressing Goal: Ability to manage health-related needs will improve Outcome: Progressing   Problem: Metabolic: Goal: Ability to maintain appropriate glucose levels will improve Outcome: Progressing   Problem: Nutritional: Goal: Maintenance of adequate nutrition will improve Outcome: Progressing Goal: Progress toward achieving an optimal weight will improve Outcome: Progressing   Problem: Skin Integrity: Goal: Risk for impaired skin  integrity will decrease Outcome: Progressing   Problem: Tissue Perfusion: Goal: Adequacy of tissue perfusion will improve Outcome: Progressing   Problem: Education: Goal: Knowledge of risk factors and measures for prevention of condition will improve Outcome: Progressing   Problem: Coping: Goal: Psychosocial and spiritual needs will be supported Outcome: Progressing   Problem: Respiratory: Goal: Will maintain a patent airway Outcome: Progressing Goal: Complications related to the disease process, condition or treatment will be avoided or minimized Outcome: Progressing   Problem: Education: Goal: Knowledge of General Education information will improve Description: Including pain rating scale, medication(s)/side effects and non-pharmacologic comfort measures Outcome: Progressing   Problem: Health Behavior/Discharge Planning: Goal: Ability to manage health-related needs will improve Outcome: Progressing   Problem: Clinical Measurements: Goal: Ability to maintain clinical measurements within normal limits will improve Outcome: Progressing Goal: Will remain free from infection Outcome: Progressing Goal: Diagnostic test results will improve Outcome: Progressing Goal: Respiratory complications will improve Outcome: Progressing Goal: Cardiovascular complication will be avoided Outcome: Progressing   Problem: Activity: Goal: Risk for activity intolerance will decrease Outcome: Progressing   Problem: Nutrition: Goal: Adequate nutrition will be maintained Outcome: Progressing   Problem: Coping: Goal: Level of anxiety will decrease Outcome: Progressing   Problem: Elimination: Goal: Will not experience complications related to bowel motility Outcome: Progressing Goal: Will not experience complications related to urinary retention Outcome: Progressing   Problem: Pain Managment: Goal: General experience of comfort will improve Outcome: Progressing   Problem:  Safety: Goal: Ability to remain free from injury will improve Outcome: Progressing   Problem: Skin Integrity: Goal: Risk for impaired skin integrity will decrease Outcome: Progressing

## 2019-05-26 NOTE — Progress Notes (Signed)
Remdesivir - Pharmacy Brief Note   O:  ALT: 73 CXR: Multifocal airspace opacities concerning for multifocal pneumonia SpO2: 91% on Room Air.   A/P:  Remdesivir 200 mg once followed by 100 mg daily x 4 days.   Gretta Arab PharmD, BCPS Clinical pharmacist phone 7am- 5pm: [phone] 05/26/2019 10:32 AM

## 2019-05-26 NOTE — Plan of Care (Signed)
Discussed with patient plan of care for this morning, pain management, bath and importance of ambulating the halls today with some teach back displayed 

## 2019-05-26 NOTE — Progress Notes (Signed)
[facility] PROGRESS NOTE    Progress Note  Shawn Torres  ONG:[id number] DOB: 08/01/1966 DOA: 05/24/2019 PCP: Patient, No Pcp Per     Brief Narrative:   Shawn Torres is an 53 y.o. male no significant past medical history who presents with 5 to 6 days unable to keep anything down, low-grade fever and nausea and vomiting  in ER with a chest x-ray showed bilateral infiltrates tested COVID positive and when sent to the ED.  He was given 2 L of normal saline and started on the Ativan protocol for withdrawal.  05/25/2019 IV steroids 04/10/2019 IV Remdesivir  Assessment/Plan:   Pneumonia due to COVID-19 virus: LFTs are significantly improved compared to admission, continue oral steroids.   His saturations are hovering between 88 and 90 on room air, he had to be started on 2 L of excision nasal cannula to keep saturation above 92%. His inflammatory markers are trending up. Blood cultures are negative till date. We will start him on IV Remdesivir as his oxygen requirements are increasing.  Hyponatremia He is hyponatremic and hypochloremic with a creatinine of 1 likely due to hypovolemia.  We will try to correct no more than 10 mEq in a 24-hour period. This morning her sodium is low likely due to hyperglycemia.  Transaminitis: Likely due to COVID-19 to have improved significantly.  Alcohol dependence with uncomplicated withdrawal (HCC) Signs of withdrawal continue thiamine and folate.  Hypokalemia: Proved after oral repletion likely due to hypovolemia.  Newly diagnosed diabetes mellitus type 2: A1c on admission was 10.8, his blood glucose is significantly elevated likely due to steroids. We will go ahead and increase his long-acting and divided to twice a day, and change sliding scale to resistance.  DVT prophylaxis: lovenxo Family Communication:none Disposition Plan/Barrier to D/C: Once he is off oxygen. Code Status:     Code Status Orders  (From admission, onward)        Start     Ordered   05/24/19 0511  Full code  Continuous     05/24/19 0512        Code Status History    This patient has a current code status but no historical code status.   Advance Care Planning Activity        IV Access:    Peripheral IV   Procedures and diagnostic studies:   No results found.   Medical Consultants:    None.  Anti-Infectives:   none  Subjective:    Shawn Torres relates his breathing has worsened compared to previous.  Objective:    Vitals:   05/26/19 0321 05/26/19 0400 05/26/19 0500 05/26/19 0505  BP: 124/76     Pulse: 62 61 62 62  Resp:      Temp: 98.2 F (36.8 C)     TempSrc: Oral     SpO2: (!) 89% 91% (!) 88% 90%  Weight:      Height:        Intake/Output Summary (Last 24 hours) at 05/26/2019 0743 Last data filed at 05/26/2019 0500 Gross per 24 hour  Intake 480 ml  Output 500 ml  Net -20 ml   Filed Weights   05/24/19 0507  Weight: 86 kg    Exam: General exam: In no acute distress. Respiratory system: Good air movement diffuse crackles bilaterally Cardiovascular system: S1 & S2 heard, RRR. No JVD. Gastrointestinal system: Abdomen is nondistended, soft and nontender.  Central nervous system: Alert and oriented. No focal neurological deficits. Extremities: No pedal edema. Skin:  No rashes, lesions or ulcers Psychiatry: Judgement and insight appear normal. Mood & affect appropriate.     Data Reviewed:    Labs: Basic Metabolic Panel: Recent Labs  Lab 05/23/19 1905 05/24/19 0647 05/24/19 1507 05/25/19 0144 05/26/19 0439  NA 125* 130* 135 133* 132*  K 3.3* 3.2* 3.8 3.2* 4.3  CL 88* 97* 98 93* 101  CO2 22 26 25 27  21*  GLUCOSE 267* 228* 166* 95 333*  BUN 18 16 15 13 20   CREATININE 1.05 1.03 0.90 0.91 0.82  CALCIUM 8.3* 7.4* 7.8* 8.1* 8.7*  MG  --  1.9  --   --   --    GFR Estimated Creatinine Clearance: 115.8 mL/min (by C-G formula based on SCr of 0.82 mg/dL). Liver Function Tests: Recent Labs  Lab  05/23/19 1905 05/24/19 0647 05/25/19 0144 05/26/19 0439  AST 303* 269* 182* 112*  ALT 112* 100* 87* 73*  ALKPHOS 142* 113 125 140*  BILITOT 1.6* 0.9 1.0 0.7  PROT 8.1 6.7 7.4 7.6  ALBUMIN 3.5 2.7* 3.0* 2.9*   Recent Labs  Lab 05/23/19 1905  LIPASE 59*   No results for input(s): AMMONIA in the last 168 hours. Coagulation profile No results for input(s): INR, PROTIME in the last 168 hours. COVID-19 Labs  Recent Labs    05/24/19 0647 05/25/19 0144 05/26/19 0438 05/26/19 0439  DDIMER 0.91* 1.08*  --  0.85*  FERRITIN 1,137* 897* 696*  --   CRP 5.3* 6.9* 10.0*  --     Lab Results  Component Value Date   SARSCOV2NAA POSITIVE (A) 05/23/2019    CBC: Recent Labs  Lab 05/23/19 1905 05/24/19 0647 05/25/19 0144 05/26/19 0439  WBC 6.6 4.2 6.5 5.1  NEUTROABS  --  1.7 3.7 3.9  HGB 16.6 15.4 16.3 17.0  HCT 45.0 42.6 46.1 46.4  MCV 92.0 95.1 97.1 96.9  PLT 146* 114* 142* 152   Cardiac Enzymes: No results for input(s): CKTOTAL, CKMB, CKMBINDEX, TROPONINI in the last 168 hours. BNP (last 3 results) No results for input(s): PROBNP in the last 8760 hours. CBG: Recent Labs  Lab 05/25/19 0805 05/25/19 1158 05/25/19 1649 05/25/19 2202 05/26/19 0724  GLUCAP 133* 119* 200* 303* 293*   D-Dimer: Recent Labs    05/25/19 0144 05/26/19 0439  DDIMER 1.08* 0.85*   Hgb A1c: Recent Labs    05/24/19 0647  HGBA1C 10.8*   Lipid Profile: No results for input(s): CHOL, HDL, LDLCALC, TRIG, CHOLHDL, LDLDIRECT in the last 72 hours. Thyroid function studies: No results for input(s): TSH, T4TOTAL, T3FREE, THYROIDAB in the last 72 hours.  Invalid input(s): FREET3 Anemia work up: Recent Labs    05/25/19 0144 05/26/19 0438  FERRITIN 897* 696*   Sepsis Labs: Recent Labs  Lab 05/23/19 1905 05/24/19 0029 05/24/19 0647 05/25/19 0144 05/26/19 0439  PROCALCITON  --   --  0.61  --   --   WBC 6.6  --  4.2 6.5 5.1  LATICACIDVEN  --  1.1  --   --   --    Microbiology  Recent Results (from the past 240 hour(s))  SARS Coronavirus 2 (CEPHEID- Performed in [place] hospital lab), Hosp Order     Status: Abnormal   Collection Time: 05/23/19 10:38 PM   Specimen: Nasopharyngeal Swab  Result Value Ref Range Status   SARS Coronavirus 2 POSITIVE (A) NEGATIVE Final    Comment: RESULT CALLED TO, READ BACK BY AND VERIFIED WITH: GRACIE [facility] ON 05/23/2019 AT 2351 QSD (NOTE)  If result is NEGATIVE SARS-CoV-2 target nucleic acids are NOT DETECTED. The SARS-CoV-2 RNA is generally detectable in upper and lower  respiratory specimens during the acute phase of infection. The lowest  concentration of SARS-CoV-2 viral copies this assay can detect is 250  copies / mL. A negative result does not preclude SARS-CoV-2 infection  and should not be used as the sole basis for treatment or other  patient management decisions.  A negative result may occur with  improper specimen collection / handling, submission of specimen other  than nasopharyngeal swab, presence of viral mutation(s) within the  areas targeted by this assay, and inadequate number of viral copies  (<250 copies / mL). A negative result must be combined with clinical  observations, patient history, and epidemiological information. If result is POSITIVE SARS-CoV-2 target nucleic acids are DETECTED.  The SARS-CoV-2 RNA is generally detectable in upper and lower  respiratory specimens during the acute phase of infection.  Positive  results are indicative of active infection with SARS-CoV-2.  Clinical  correlation with patient history and other diagnostic information is  necessary to determine patient infection status.  Positive results do  not rule out bacterial infection or co-infection with other viruses. If result is PRESUMPTIVE POSTIVE SARS-CoV-2 nucleic acids MAY BE PRESENT.   A presumptive positive result was obtained on the submitted specimen  and confirmed on repeat testing.  While 2019 novel coronavirus   (SARS-CoV-2) nucleic acids may be present in the submitted sample  additional confirmatory testing may be necessary for epidemiological  and / or clinical management purposes  to differentiate between  SARS-CoV-2 and other Sarbecovirus currently known to infect humans.  If clinically indicated additional testing with an alternate test  methodology [id number])  is advised. The SARS-CoV-2 RNA is generally  detectable in upper and lower respiratory specimens during the acute  phase of infection. The expected result is Negative. Fact Sheet for Patients:  BoilerBrush.com.cyhttps://www.fda.gov/media/136312/download Fact Sheet for Healthcare Providers: https://pope.com/https://www.fda.gov/media/136313/download This test is not yet approved or cleared by the [place] FDA and has been authorized for detection and/or diagnosis of SARS-CoV-2 by FDA under an Emergency Use Authorization (EUA).  This EUA will remain in effect (meaning this test can be used) for the duration of the COVID-19 declaration under Section 564(b)(1) of the Act, 21 U.S.C. section 360bbb-3(b)(1), unless the authorization is terminated or revoked sooner. Performed at [facility], [address] [place], [place] [address]   Blood Culture (routine x 2)     Status: None (Preliminary result)   Collection Time: 05/24/19 12:29 AM   Specimen: BLOOD  Result Value Ref Range Status   Specimen Description BLOOD BLOOD RIGHT HAND  Final   Special Requests   Final    BOTTLES DRAWN AEROBIC AND ANAEROBIC Blood Culture adequate volume   Culture   Final    NO GROWTH 1 DAY Performed at [facility], [address] [place], [place] [address]    Report Status PENDING  Incomplete  Blood Culture (routine x 2)     Status: None (Preliminary result)   Collection Time: 05/24/19 12:29 AM   Specimen: BLOOD  Result Value Ref Range Status   Specimen Description BLOOD RIGHT ANTECUBITAL  Final   Special Requests   Final    BOTTLES DRAWN AEROBIC AND  ANAEROBIC Blood Culture adequate volume   Culture   Final    NO GROWTH 1 DAY Performed at [facility], [address] [place], [place] [address]  Report Status PENDING  Incomplete     Medications:   . enoxaparin (LOVENOX) injection  40 mg Subcutaneous Q24H  . folic acid  1 mg Oral Daily  . insulin aspart  0-5 Units Subcutaneous QHS  . insulin aspart  0-9 Units Subcutaneous TID WC  . insulin aspart  3 Units Subcutaneous TID WC  . insulin detemir  5 Units Subcutaneous BID  . methylPREDNISolone (SOLU-MEDROL) injection  40 mg Intravenous Q12H  . multivitamin with minerals  1 tablet Oral Daily  . thiamine  100 mg Oral Daily   Or  . thiamine  100 mg Intravenous Daily  . vitamin C  500 mg Oral Daily  . zinc sulfate  220 mg Oral Daily   Continuous Infusions:    LOS: 2 days   Marinda ElkAbraham Feliz Ortiz  [facility]  05/26/2019, 7:43 AM

## 2019-05-27 LAB — COMPREHENSIVE METABOLIC PANEL
ALT: 65 U/L — ABNORMAL HIGH (ref 0–44)
AST: 79 U/L — ABNORMAL HIGH (ref 15–41)
Albumin: 2.7 g/dL — ABNORMAL LOW (ref 3.5–5.0)
Alkaline Phosphatase: 155 U/L — ABNORMAL HIGH (ref 38–126)
Anion gap: 12 (ref 5–15)
BUN: 29 mg/dL — ABNORMAL HIGH (ref 6–20)
CO2: 20 mmol/L — ABNORMAL LOW (ref 22–32)
Calcium: 8.9 mg/dL (ref 8.9–10.3)
Chloride: 103 mmol/L (ref 98–111)
Creatinine, Ser: 0.89 mg/dL (ref 0.61–1.24)
GFR calc Af Amer: >60 mL/min (ref >60)
GFR calc non Af Amer: >60 mL/min (ref >60)
Glucose, Bld: 309 mg/dL — ABNORMAL HIGH (ref 70–99)
Potassium: 3.6 mmol/L (ref 3.5–5.1)
Sodium: 135 mmol/L (ref 135–145)
Total Bilirubin: 0.5 mg/dL (ref 0.3–1.2)
Total Protein: 7 g/dL (ref 6.5–8.1)

## 2019-05-27 LAB — CBC WITH DIFFERENTIAL/PLATELET
Abs Immature Granulocytes: 0.07 10*3/uL (ref 0.00–0.07)
Basophils Absolute: 0 10*3/uL (ref 0.0–0.1)
Basophils Relative: 0 %
Eosinophils Absolute: 0 10*3/uL (ref 0.0–0.5)
Eosinophils Relative: 0 %
HCT: 45.1 % (ref 39.0–52.0)
Hemoglobin: 15.6 g/dL (ref 13.0–17.0)
Immature Granulocytes: 1 %
Lymphocytes Relative: 9 %
Lymphs Abs: 1.1 10*3/uL (ref 0.7–4.0)
MCH: 34.1 pg — ABNORMAL HIGH (ref 26.0–34.0)
MCHC: 34.6 g/dL (ref 30.0–36.0)
MCV: 98.7 fL (ref 80.0–100.0)
Monocytes Absolute: 0.5 10*3/uL (ref 0.1–1.0)
Monocytes Relative: 4 %
Neutro Abs: 11.3 10*3/uL — ABNORMAL HIGH (ref 1.7–7.7)
Neutrophils Relative %: 86 %
Platelets: 172 10*3/uL (ref 150–400)
RBC: 4.57 MIL/uL (ref 4.22–5.81)
RDW: 12.4 % (ref 11.5–15.5)
WBC: 13 10*3/uL — ABNORMAL HIGH (ref 4.0–10.5)
nRBC: 0 % (ref 0.0–0.2)

## 2019-05-27 LAB — GLUCOSE, CAPILLARY
Glucose-Capillary: 237 mg/dL — ABNORMAL HIGH (ref 70–99)
Glucose-Capillary: 353 mg/dL — ABNORMAL HIGH (ref 70–99)
Glucose-Capillary: 389 mg/dL — ABNORMAL HIGH (ref 70–99)
Glucose-Capillary: 334 mg/dL — ABNORMAL HIGH (ref 70–99)
Glucose-Capillary: 206 mg/dL — ABNORMAL HIGH (ref 70–99)

## 2019-05-27 LAB — D-DIMER, QUANTITATIVE: D-Dimer, Quant: 0.75 ug/mL-FEU — ABNORMAL HIGH (ref 0.00–0.50)

## 2019-05-27 LAB — C-REACTIVE PROTEIN: CRP: 4.9 mg/dL — ABNORMAL HIGH (ref <1.0)

## 2019-05-27 LAB — FERRITIN: Ferritin: 493 ng/mL — ABNORMAL HIGH (ref 24–336)

## 2019-05-27 MED ORDER — INSULIN DETEMIR 100 UNIT/ML ~~LOC~~ SOLN
30.0000 [IU] | Freq: Two times a day (BID) | SUBCUTANEOUS | Status: DC
  Administered 2019-05-27 (×2): 30 [IU] via SUBCUTANEOUS
  Filled 2019-05-27 (×3): qty 0.3

## 2019-05-27 MED ORDER — INSULIN STARTER KIT- SYRINGES (SPANISH)
1.0000 | Freq: Once | Status: AC
  Administered 2019-05-27: 1
  Filled 2019-05-27: qty 1

## 2019-05-27 MED ORDER — INSULIN DETEMIR 100 UNIT/ML ~~LOC~~ SOLN
25.0000 [IU] | Freq: Two times a day (BID) | SUBCUTANEOUS | Status: DC
  Filled 2019-05-27: qty 0.25

## 2019-05-27 NOTE — Plan of Care (Signed)
Discussed with patient plan of care for the evening, pain management and importance of deep breathing and attempting proning with some teach back displayed.  Patient will update daughter

## 2019-05-27 NOTE — Plan of Care (Signed)
Problem: Education: Goal: Knowledge of General Education information will improve Description: Including pain rating scale, medication(s)/side effects and non-pharmacologic comfort measures 05/27/2019 0713 by Shanon Ace, RN Outcome: Progressing 05/26/2019 1803 by Shanon Ace, RN Outcome: Progressing   Problem: Health Behavior/Discharge Planning: Goal: Ability to manage health-related needs will improve 05/27/2019 0713 by Shanon Ace, RN Outcome: Progressing 05/26/2019 1803 by Shanon Ace, RN Outcome: Progressing   Problem: Clinical Measurements: Goal: Will remain free from infection 05/27/2019 0713 by Shanon Ace, RN Outcome: Progressing 05/26/2019 1803 by Shanon Ace, RN Outcome: Progressing Goal: Diagnostic test results will improve 05/27/2019 0713 by Shanon Ace, RN Outcome: Progressing 05/26/2019 1803 by Shanon Ace, RN Outcome: Progressing Goal: Respiratory complications will improve 05/27/2019 0713 by Shanon Ace, RN Outcome: Progressing 05/26/2019 1803 by Shanon Ace, RN Outcome: Progressing   Problem: Nutrition: Goal: Adequate nutrition will be maintained 05/27/2019 0713 by Shanon Ace, RN Outcome: Progressing 05/26/2019 1803 by Shanon Ace, RN Outcome: Progressing   Problem: Safety: Goal: Ability to remain free from injury will improve 05/27/2019 0713 by Shanon Ace, RN Outcome: Progressing 05/26/2019 1803 by Shanon Ace, RN Outcome: Progressing   Problem: Skin Integrity: Goal: Risk for impaired skin integrity will decrease 05/27/2019 0713 by Shanon Ace, RN Outcome: Progressing 05/26/2019 1803 by Shanon Ace, RN Outcome: Progressing   Problem: Education: Goal: Ability to describe self-care measures that may prevent or decrease complications (Diabetes Survival Skills Education) will improve 05/27/2019 0713 by Shanon Ace, RN Outcome: Progressing 05/26/2019 1803 by Shanon Ace, RN Outcome: Progressing Goal: Individualized Educational  Video(s) 05/27/2019 0713 by Shanon Ace, RN Outcome: Progressing 05/26/2019 1803 by Shanon Ace, RN Outcome: Progressing   Problem: Coping: Goal: Ability to adjust to condition or change in health will improve 05/27/2019 0713 by Shanon Ace, RN Outcome: Progressing 05/26/2019 1803 by Shanon Ace, RN Outcome: Progressing   Problem: Fluid Volume: Goal: Ability to maintain a balanced intake and output will improve 05/27/2019 0713 by Shanon Ace, RN Outcome: Progressing 05/26/2019 1803 by Shanon Ace, RN Outcome: Progressing   Problem: Health Behavior/Discharge Planning: Goal: Ability to identify and utilize available resources and services will improve 05/27/2019 0713 by Shanon Ace, RN Outcome: Progressing 05/26/2019 1803 by Shanon Ace, RN Outcome: Progressing Goal: Ability to manage health-related needs will improve 05/27/2019 0713 by Shanon Ace, RN Outcome: Progressing 05/26/2019 1803 by Shanon Ace, RN Outcome: Progressing   Problem: Metabolic: Goal: Ability to maintain appropriate glucose levels will improve 05/27/2019 0713 by Shanon Ace, RN Outcome: Progressing 05/26/2019 1803 by Shanon Ace, RN Outcome: Progressing   Problem: Nutritional: Goal: Maintenance of adequate nutrition will improve 05/27/2019 0713 by Shanon Ace, RN Outcome: Progressing 05/26/2019 1803 by Shanon Ace, RN Outcome: Progressing Goal: Progress toward achieving an optimal weight will improve 05/27/2019 0713 by Shanon Ace, RN Outcome: Progressing 05/26/2019 1803 by Shanon Ace, RN Outcome: Progressing   Problem: Skin Integrity: Goal: Risk for impaired skin integrity will decrease 05/27/2019 0713 by Shanon Ace, RN Outcome: Progressing 05/26/2019 1803 by Shanon Ace, RN Outcome: Progressing   Problem: Tissue Perfusion: Goal: Adequacy of tissue perfusion will improve 05/27/2019 0713 by Shanon Ace, RN Outcome: Progressing 05/26/2019 1803 by Shanon Ace, RN Outcome: Progressing    Problem: Education: Goal: Knowledge of risk factors and measures for prevention of condition will improve 05/27/2019 0713 by Shanon Ace, RN Outcome: Progressing 05/26/2019 1803 by Shanon Ace, RN Outcome: Progressing   Problem: Coping: Goal: Psychosocial and spiritual needs will be supported 05/27/2019 0713 by Shanon Ace, RN Outcome: Progressing 05/26/2019 1803 by Shanon Ace, RN  Outcome: Progressing   Problem: Respiratory: Goal: Will maintain a patent airway 05/27/2019 0713 by Luna KitchensSeth, Jacson Rapaport, RN Outcome: Progressing 05/26/2019 1803 by Luna KitchensSeth, Marija Calamari, RN Outcome: Progressing Goal: Complications related to the disease process, condition or treatment will be avoided or minimized 05/27/2019 0713 by Luna KitchensSeth, Easton Sivertson, RN Outcome: Progressing 05/26/2019 1803 by Luna KitchensSeth, Undray Allman, RN Outcome: Progressing   Problem: Education: Goal: Knowledge of General Education information will improve Description: Including pain rating scale, medication(s)/side effects and non-pharmacologic comfort measures 05/27/2019 0713 by Luna KitchensSeth, Sakinah Rosamond, RN Outcome: Progressing 05/26/2019 1803 by Luna KitchensSeth, Ross Bender, RN Outcome: Progressing   Problem: Health Behavior/Discharge Planning: Goal: Ability to manage health-related needs will improve 05/27/2019 0713 by Luna KitchensSeth, Carlyle Achenbach, RN Outcome: Progressing 05/26/2019 1803 by Luna KitchensSeth, Tymere Depuy, RN Outcome: Progressing   Problem: Clinical Measurements: Goal: Ability to maintain clinical measurements within normal limits will improve 05/27/2019 0713 by Luna KitchensSeth, Ansel Ferrall, RN Outcome: Progressing 05/26/2019 1803 by Luna KitchensSeth, Coral Timme, RN Outcome: Progressing Goal: Will remain free from infection 05/27/2019 0713 by Luna KitchensSeth, Dorleen Kissel, RN Outcome: Progressing 05/26/2019 1803 by Luna KitchensSeth, Tierra Divelbiss, RN Outcome: Progressing Goal: Diagnostic test results will improve 05/27/2019 0713 by Luna KitchensSeth, Ottis Vacha, RN Outcome: Progressing 05/26/2019 1803 by Luna KitchensSeth, Humaira Sculley, RN Outcome: Progressing Goal: Respiratory complications will  improve 05/27/2019 0713 by Luna KitchensSeth, Taje Tondreau, RN Outcome: Progressing 05/26/2019 1803 by Luna KitchensSeth, Kaley Jutras, RN Outcome: Progressing Goal: Cardiovascular complication will be avoided 05/27/2019 0713 by Luna KitchensSeth, Oree Hislop, RN Outcome: Progressing 05/26/2019 1803 by Luna KitchensSeth, Rhylan Gross, RN Outcome: Progressing   Problem: Activity: Goal: Risk for activity intolerance will decrease 05/27/2019 0713 by Luna KitchensSeth, Makani Seckman, RN Outcome: Progressing 05/26/2019 1803 by Luna KitchensSeth, Steven Veazie, RN Outcome: Progressing   Problem: Nutrition: Goal: Adequate nutrition will be maintained 05/27/2019 0713 by Luna KitchensSeth, Delno Blaisdell, RN Outcome: Progressing 05/26/2019 1803 by Luna KitchensSeth, Sacred Roa, RN Outcome: Progressing   Problem: Coping: Goal: Level of anxiety will decrease 05/27/2019 0713 by Luna KitchensSeth, Rabecca Birge, RN Outcome: Progressing 05/26/2019 1803 by Luna KitchensSeth, Alyvia Derk, RN Outcome: Progressing   Problem: Elimination: Goal: Will not experience complications related to bowel motility 05/27/2019 0713 by Luna KitchensSeth, Laurel Smeltz, RN Outcome: Progressing 05/26/2019 1803 by Luna KitchensSeth, Artisha Capri, RN Outcome: Progressing Goal: Will not experience complications related to urinary retention 05/27/2019 0713 by Luna KitchensSeth, Skiler Olden, RN Outcome: Progressing 05/26/2019 1803 by Luna KitchensSeth, Delonta Yohannes, RN Outcome: Progressing   Problem: Pain Managment: Goal: General experience of comfort will improve 05/27/2019 0713 by Luna KitchensSeth, Dashayla Theissen, RN Outcome: Progressing 05/26/2019 1803 by Luna KitchensSeth, Tywon Niday, RN Outcome: Progressing   Problem: Safety: Goal: Ability to remain free from injury will improve 05/27/2019 0713 by Luna KitchensSeth, Lennyn Gange, RN Outcome: Progressing 05/26/2019 1803 by Luna KitchensSeth, Nkechi Linehan, RN Outcome: Progressing   Problem: Skin Integrity: Goal: Risk for impaired skin integrity will decrease 05/27/2019 0713 by Luna KitchensSeth, Shoshanah Dapper, RN Outcome: Progressing 05/26/2019 1803 by Luna KitchensSeth, Shakina Choy, RN Outcome: Progressing

## 2019-05-27 NOTE — Progress Notes (Signed)
Educated to patient about prone, and benefits with spainish interpreter.  I then proned the Pt.  He lied on his side for approximately 1 hour, and than turned to right side.   Spoke to Pt's daughter, advised her that the Pt still getting the remdesivir and will be getting until Thursday of this week.  Advised that I had asked the Pt to Prone, education provided to daughter, and she will talk to Pt and give support to Pt to prone during the day and at night.Marland KitchenMarland Kitchen

## 2019-05-27 NOTE — Progress Notes (Addendum)
[facility] PROGRESS NOTE    Progress Note  Bertell MariaJulio Sosa  ZOX:[id number] DOB: 06/12/1966 DOA: 05/24/2019 PCP: Patient, No Pcp Per     Brief Narrative:   Bertell MariaJulio Sosa is an 53 y.o. male no significant past medical history who presents with 5 to 6 days unable to keep anything down, low-grade fever and nausea and vomiting  in ER with a chest x-ray showed bilateral infiltrates tested COVID positive and when sent to the ED.  He was given 2 L of normal saline and started on the Ativan protocol for withdrawal.  05/25/2019 IV steroids 05/27/2019 IV Remdesivir  Assessment/Plan:   Pneumonia due to COVID-19 virus: LFT's are significantly improved compared to admission. Saturations have stabilized he is currently greater than 89% on 2 L of oxygen. Inflammatory markers are significantly high over the past few days and have been trending up, they are pending this morning. Continue IV steroids and IV Remdesivir.  Hypovolemic hyponatremia Likely due to hypovolemia resolved with IV fluid hydration.  Transaminitis: Likely due to COVID-19 to have improved significantly.  Alcohol dependence with uncomplicated withdrawal (HCC) Signs of withdrawal continue thiamine and folate.  Hypokalemia: Proved after oral repletion likely due to hypovolemia.  Newly diagnosed diabetes mellitus type 2: A1c on admission was 10.8, blood glucose continues to be significantly elevated and hard to control likely due to steroids. We will double his long-acting insulin continue sliding scale.  DVT prophylaxis: lovenxo Family Communication:none Disposition Plan/Barrier to D/C: Once he is off oxygen. Code Status:     Code Status Orders  (From admission, onward)         Start     Ordered   05/24/19 0511  Full code  Continuous     05/24/19 0512        Code Status History    This patient has a current code status but no historical code status.   Advance Care Planning Activity        IV Access:     Peripheral IV   Procedures and diagnostic studies:   No results found.   Medical Consultants:    None.  Anti-Infectives:   none  Subjective:    Bertell MariaJulio Sosa relates he relates his breathing is unchanged compared to yesterday.  Objective:    Vitals:   05/27/19 0300 05/27/19 0400 05/27/19 0500 05/27/19 0600  BP:      Pulse: 65 (!) 57 (!) 55 (!) 52  Resp:      Temp:      TempSrc:      SpO2: 92% (!) 89% 91% 91%  Weight:      Height:        Intake/Output Summary (Last 24 hours) at 05/27/2019 0703 Last data filed at 05/27/2019 0500 Gross per 24 hour  Intake 850 ml  Output 500 ml  Net 350 ml   Filed Weights   05/24/19 0507  Weight: 86 kg    Exam: General exam: In no acute distress. Respiratory system: Good air movement with diffuse crackles. Cardiovascular system: S1 & S2 heard, RRR. No JVD. Gastrointestinal system: Abdomen is nondistended, soft and nontender.  Central nervous system: Alert and oriented. No focal neurological deficits. Extremities: No pedal edema. Skin: No rashes, lesions or ulcers Psychiatry: Judgement and insight appear normal. Mood & affect appropriate.   Data Reviewed:    Labs: Basic Metabolic Panel: Recent Labs  Lab 05/23/19 1905 05/24/19 0647 05/24/19 1507 05/25/19 0144 05/26/19 0439  NA 125* 130* 135 133* 132*  K 3.3*  3.2* 3.8 3.2* 4.3  CL 88* 97* 98 93* 101  CO2 22 26 25 27  21*  GLUCOSE 267* 228* 166* 95 333*  BUN 18 16 15 13 20   CREATININE 1.05 1.03 0.90 0.91 0.82  CALCIUM 8.3* 7.4* 7.8* 8.1* 8.7*  MG  --  1.9  --   --   --    GFR Estimated Creatinine Clearance: 115.8 mL/min (by C-G formula based on SCr of 0.82 mg/dL). Liver Function Tests: Recent Labs  Lab 05/23/19 1905 05/24/19 0647 05/25/19 0144 05/26/19 0439  AST 303* 269* 182* 112*  ALT 112* 100* 87* 73*  ALKPHOS 142* 113 125 140*  BILITOT 1.6* 0.9 1.0 0.7  PROT 8.1 6.7 7.4 7.6  ALBUMIN 3.5 2.7* 3.0* 2.9*   Recent Labs  Lab 05/23/19 1905  LIPASE 59*    No results for input(s): AMMONIA in the last 168 hours. Coagulation profile No results for input(s): INR, PROTIME in the last 168 hours. COVID-19 Labs  Recent Labs    05/25/19 0144 05/26/19 0438 05/26/19 0439  DDIMER 1.08*  --  0.85*  FERRITIN 897* 696*  --   CRP 6.9* 10.0*  --     Lab Results  Component Value Date   SARSCOV2NAA POSITIVE (A) 05/23/2019    CBC: Recent Labs  Lab 05/23/19 1905 05/24/19 0647 05/25/19 0144 05/26/19 0439  WBC 6.6 4.2 6.5 5.1  NEUTROABS  --  1.7 3.7 3.9  HGB 16.6 15.4 16.3 17.0  HCT 45.0 42.6 46.1 46.4  MCV 92.0 95.1 97.1 96.9  PLT 146* 114* 142* 152   Cardiac Enzymes: No results for input(s): CKTOTAL, CKMB, CKMBINDEX, TROPONINI in the last 168 hours. BNP (last 3 results) No results for input(s): PROBNP in the last 8760 hours. CBG: Recent Labs  Lab 05/25/19 2202 05/26/19 0724 05/26/19 1134 05/26/19 1636 05/26/19 2029  GLUCAP 303* 293* 364* 320* 266*   D-Dimer: Recent Labs    05/25/19 0144 05/26/19 0439  DDIMER 1.08* 0.85*   Hgb A1c: No results for input(s): HGBA1C in the last 72 hours. Lipid Profile: No results for input(s): CHOL, HDL, LDLCALC, TRIG, CHOLHDL, LDLDIRECT in the last 72 hours. Thyroid function studies: No results for input(s): TSH, T4TOTAL, T3FREE, THYROIDAB in the last 72 hours.  Invalid input(s): FREET3 Anemia work up: Recent Labs    05/25/19 0144 05/26/19 0438  FERRITIN 897* 696*   Sepsis Labs: Recent Labs  Lab 05/23/19 1905 05/24/19 0029 05/24/19 0647 05/25/19 0144 05/26/19 0439  PROCALCITON  --   --  0.61  --   --   WBC 6.6  --  4.2 6.5 5.1  LATICACIDVEN  --  1.1  --   --   --    Microbiology Recent Results (from the past 240 hour(s))  SARS Coronavirus 2 (CEPHEID- Performed in [facility] hospital lab), Hosp Order     Status: Abnormal   Collection Time: 05/23/19 10:38 PM   Specimen: Nasopharyngeal Swab  Result Value Ref Range Status   SARS Coronavirus 2 POSITIVE (A) NEGATIVE Final     Comment: RESULT CALLED TO, READ BACK BY AND VERIFIED WITH: GRACIE WINEMAN ON 05/23/2019 AT 2351 QSD (NOTE) If result is NEGATIVE SARS-CoV-2 target nucleic acids are NOT DETECTED. The SARS-CoV-2 RNA is generally detectable in upper and lower  respiratory specimens during the acute phase of infection. The lowest  concentration of SARS-CoV-2 viral copies this assay can detect is 250  copies / mL. A negative result does not preclude SARS-CoV-2 infection  and should  not be used as the sole basis for treatment or other  patient management decisions.  A negative result may occur with  improper specimen collection / handling, submission of specimen other  than nasopharyngeal swab, presence of viral mutation(s) within the  areas targeted by this assay, and inadequate number of viral copies  (<250 copies / mL). A negative result must be combined with clinical  observations, patient history, and epidemiological information. If result is POSITIVE SARS-CoV-2 target nucleic acids are DETECTED.  The SARS-CoV-2 RNA is generally detectable in upper and lower  respiratory specimens during the acute phase of infection.  Positive  results are indicative of active infection with SARS-CoV-2.  Clinical  correlation with patient history and other diagnostic information is  necessary to determine patient infection status.  Positive results do  not rule out bacterial infection or co-infection with other viruses. If result is PRESUMPTIVE POSTIVE SARS-CoV-2 nucleic acids MAY BE PRESENT.   A presumptive positive result was obtained on the submitted specimen  and confirmed on repeat testing.  While 2019 novel coronavirus  (SARS-CoV-2) nucleic acids may be present in the submitted sample  additional confirmatory testing may be necessary for epidemiological  and / or clinical management purposes  to differentiate between  SARS-CoV-2 and other Sarbecovirus currently known to infect humans.  If clinically indicated  additional testing with an alternate test  methodology [phone])  is advised. The SARS-CoV-2 RNA is generally  detectable in upper and lower respiratory specimens during the acute  phase of infection. The expected result is Negative. Fact Sheet for Patients:  StrictlyIdeas.no Fact Sheet for Healthcare Providers: BankingDealers.co.za This test is not yet approved or cleared by the [place] FDA and has been authorized for detection and/or diagnosis of SARS-CoV-2 by FDA under an Emergency Use Authorization (EUA).  This EUA will remain in effect (meaning this test can be used) for the duration of the COVID-19 declaration under Section 564(b)(1) of the Act, 21 U.S.C. section 360bbb-3(b)(1), unless the authorization is terminated or revoked sooner. Performed at [facility], [address] [place], [place] [address]   Blood Culture (routine x 2)     Status: None (Preliminary result)   Collection Time: 05/24/19 12:29 AM   Specimen: BLOOD  Result Value Ref Range Status   Specimen Description BLOOD BLOOD RIGHT HAND  Final   Special Requests   Final    BOTTLES DRAWN AEROBIC AND ANAEROBIC Blood Culture adequate volume   Culture   Final    NO GROWTH 2 DAYS Performed at [facility], [address], [place], [place] [address]    Report Status PENDING  Incomplete  Blood Culture (routine x 2)     Status: None (Preliminary result)   Collection Time: 05/24/19 12:29 AM   Specimen: BLOOD  Result Value Ref Range Status   Specimen Description BLOOD RIGHT ANTECUBITAL  Final   Special Requests   Final    BOTTLES DRAWN AEROBIC AND ANAEROBIC Blood Culture adequate volume   Culture   Final    NO GROWTH 2 DAYS Performed at [facility], [address] [place], [place] [address]    Report Status PENDING  Incomplete     Medications:   . enoxaparin (LOVENOX) injection  40 mg Subcutaneous Q24H  . folic acid  1 mg  Oral Daily  . insulin aspart  0-20 Units Subcutaneous TID WC  . insulin aspart  0-5 Units Subcutaneous QHS  . insulin aspart  6 Units Subcutaneous TID WC  .  insulin detemir  15 Units Subcutaneous BID  . methylPREDNISolone (SOLU-MEDROL) injection  40 mg Intravenous Q12H  . multivitamin with minerals  1 tablet Oral Daily  . thiamine  100 mg Oral Daily   Or  . thiamine  100 mg Intravenous Daily  . vitamin C  500 mg Oral Daily  . zinc sulfate  220 mg Oral Daily   Continuous Infusions: . remdesivir 100 mg in NS 250 mL       LOS: 3 days   Marinda ElkAbraham Feliz Ortiz  [facility]  05/27/2019, 7:03 AM

## 2019-05-28 LAB — CBC WITH DIFFERENTIAL/PLATELET
Abs Immature Granulocytes: 0.04 10*3/uL (ref 0.00–0.07)
Basophils Absolute: 0 10*3/uL (ref 0.0–0.1)
Basophils Relative: 0 %
Eosinophils Absolute: 0 10*3/uL (ref 0.0–0.5)
Eosinophils Relative: 0 %
HCT: 43.6 % (ref 39.0–52.0)
Hemoglobin: 15.8 g/dL (ref 13.0–17.0)
Immature Granulocytes: 0 %
Lymphocytes Relative: 10 %
Lymphs Abs: 1.1 10*3/uL (ref 0.7–4.0)
MCH: 35.6 pg — ABNORMAL HIGH (ref 26.0–34.0)
MCHC: 36.2 g/dL — ABNORMAL HIGH (ref 30.0–36.0)
MCV: 98.2 fL (ref 80.0–100.0)
Monocytes Absolute: 0.6 10*3/uL (ref 0.1–1.0)
Monocytes Relative: 5 %
Neutro Abs: 9.5 10*3/uL — ABNORMAL HIGH (ref 1.7–7.7)
Neutrophils Relative %: 85 %
Platelets: 181 10*3/uL (ref 150–400)
RBC: 4.44 MIL/uL (ref 4.22–5.81)
RDW: 12.3 % (ref 11.5–15.5)
WBC: 11.2 10*3/uL — ABNORMAL HIGH (ref 4.0–10.5)
nRBC: 0 % (ref 0.0–0.2)

## 2019-05-28 LAB — COMPREHENSIVE METABOLIC PANEL
ALT: 83 U/L — ABNORMAL HIGH (ref 0–44)
AST: 85 U/L — ABNORMAL HIGH (ref 15–41)
Albumin: 2.6 g/dL — ABNORMAL LOW (ref 3.5–5.0)
Alkaline Phosphatase: 150 U/L — ABNORMAL HIGH (ref 38–126)
Anion gap: 11 (ref 5–15)
BUN: 26 mg/dL — ABNORMAL HIGH (ref 6–20)
CO2: 20 mmol/L — ABNORMAL LOW (ref 22–32)
Calcium: 8.3 mg/dL — ABNORMAL LOW (ref 8.9–10.3)
Chloride: 103 mmol/L (ref 98–111)
Creatinine, Ser: 0.67 mg/dL (ref 0.61–1.24)
GFR calc Af Amer: >60 mL/min (ref >60)
GFR calc non Af Amer: >60 mL/min (ref >60)
Glucose, Bld: 293 mg/dL — ABNORMAL HIGH (ref 70–99)
Potassium: 3.4 mmol/L — ABNORMAL LOW (ref 3.5–5.1)
Sodium: 134 mmol/L — ABNORMAL LOW (ref 135–145)
Total Bilirubin: 0.6 mg/dL (ref 0.3–1.2)
Total Protein: 6.5 g/dL (ref 6.5–8.1)

## 2019-05-28 LAB — C-REACTIVE PROTEIN: CRP: 2.3 mg/dL — ABNORMAL HIGH (ref <1.0)

## 2019-05-28 LAB — D-DIMER, QUANTITATIVE: D-Dimer, Quant: 0.55 ug/mL-FEU — ABNORMAL HIGH (ref 0.00–0.50)

## 2019-05-28 LAB — GLUCOSE, CAPILLARY
Glucose-Capillary: 237 mg/dL — ABNORMAL HIGH (ref 70–99)
Glucose-Capillary: 379 mg/dL — ABNORMAL HIGH (ref 70–99)
Glucose-Capillary: 276 mg/dL — ABNORMAL HIGH (ref 70–99)
Glucose-Capillary: 349 mg/dL — ABNORMAL HIGH (ref 70–99)

## 2019-05-28 LAB — FERRITIN: Ferritin: 422 ng/mL — ABNORMAL HIGH (ref 24–336)

## 2019-05-28 MED ORDER — INSULIN DETEMIR 100 UNIT/ML ~~LOC~~ SOLN
45.0000 [IU] | Freq: Two times a day (BID) | SUBCUTANEOUS | Status: DC
  Administered 2019-05-28 – 2019-05-29 (×4): 45 [IU] via SUBCUTANEOUS
  Filled 2019-05-28 (×5): qty 0.45

## 2019-05-28 MED ORDER — POTASSIUM CHLORIDE CRYS ER 20 MEQ PO TBCR
40.0000 meq | EXTENDED_RELEASE_TABLET | Freq: Two times a day (BID) | ORAL | Status: AC
  Administered 2019-05-28 (×2): 40 meq via ORAL
  Filled 2019-05-28 (×2): qty 2

## 2019-05-28 NOTE — Progress Notes (Signed)
Inpatient Diabetes Program Recommendations  AACE/ADA: New Consensus Statement on Inpatient Glycemic Control (2015)  Target Ranges:  Prepandial:   less than 140 mg/dL      Peak postprandial:   less than 180 mg/dL (1-2 hours)      Critically ill patients:  140 - 180 mg/dL   Lab Results  Component Value Date   GLUCAP 379 (H) 05/28/2019   HGBA1C 10.8 (H) 05/24/2019    Review of Glycemic Control Results for Shawn Torres, Shawn Torres (MRN [id number]) as of 05/28/2019 15:27  Ref. Range 05/27/2019 15:28 05/27/2019 16:52 05/27/2019 20:50 05/28/2019 08:12 05/28/2019 11:48  Glucose-Capillary Latest Ref Range: 70 - 99 mg/dL 389 (H) 334 (H) 206 (H) 237 (H) 379 (H)   Diabetes history: No prior hx Current orders for Inpatient glycemic control: Levemir 45 units bid + Novolog 6 units tid + Novolog resistant correction tid + hs 0-5 units  Inpatient Diabetes Program Recommendations:   Spoke with pt via phone using interpretative services interpretor # [phone] about new diagnosis. Discussed A1C results with them and explained what an A1C is, basic pathophysiology of DM Type 2, basic home care, basic diabetes diet nutrition principles, importance of checking CBGs and maintaining good CBG control to prevent long-term and short-term complications. Reviewed signs and symptoms of hyperglycemia and hypoglycemia and how to treat hypoglycemia at home. Also reviewed blood sugar goals at home.  RNs to provide ongoing basic DM education at bedside with this patient.  Reviewed basic nutrition information and patient has been drinking 2-3 cokes per day but willing to transition to sugar free and water as drinks. Reviewed about limiting number of carbohydrates and patient plans to decrease amount of tortillas eater per meal. Patient plans to read Living Well With Diabetes book. Will follow during hospitalization.  Thank you, Nani Gasser. Jerica Creegan, RN, MSN, CDE  Diabetes Coordinator Inpatient Glycemic Control Team Team Pager [phone]  (8am-5pm) 05/28/2019 3:33 PM

## 2019-05-28 NOTE — Progress Notes (Signed)
FAmily update: Patient states he will update family. Patient currently prone for 3 hours so far today.

## 2019-05-28 NOTE — Progress Notes (Signed)
[facility] PROGRESS NOTE    Progress Note  Kairen Hallinan  YTK:[id number] DOB: 04-Mar-1966 DOA: 05/24/2019 PCP: Patient, No Pcp Per     Brief Narrative:   Carmeron Heady is an 53 y.o. male no significant past medical history who presents with 5 to 6 days unable to keep anything down, low-grade fever and nausea and vomiting  in ER with a chest x-ray showed bilateral infiltrates tested COVID positive and when sent to the ED.  He was given 2 L of normal saline and started on the Ativan protocol for withdrawal.  05/25/2019 IV steroids 05/27/2019 IV Remdesivir  Assessment/Plan:   Pneumonia due to COVID-19 virus: Has remained stable on 2 L of nasal cannula saturations have been greater than 91%. Continue IV steroids and IV Remdesivir. Inflammatory markers are significantly improved. Keep patient prone for at least 16 hours a day.  He relates his breathing is much improved compared to yesterday.  Hypovolemic hyponatremia Likely due to hypovolemia resolved with IV fluid hydration. He now has pseudohyponatremia likely due to hyperglycemia.  Transaminitis: These are significantly improve, it is likely due to SARS-CoV-2.Marland Kitchen  Alcohol dependence with uncomplicated withdrawal ([facility]) Signs of withdrawal continue thiamine and folate.  Hypokalemia: Low again today, replete orally recheck in the morning.  Newly diagnosed diabetes mellitus type 2: Blood glucose continues to be significantly elevated, will increase long-acting insulin. Hemoglobin A1c is 10.8 we will  need oral hypoglycemic agents for home.  DVT prophylaxis: lovenxo Family Communication:none Disposition Plan/Barrier to D/C: Once he is off oxygen. Code Status:     Code Status Orders  (From admission, onward)         Start     Ordered   05/24/19 0511  Full code  Continuous     05/24/19 0512        Code Status History    This patient has a current code status but no historical code status.   Advance Care Planning Activity        IV Access:    Peripheral IV   Procedures and diagnostic studies:   No results found.   Medical Consultants:    None.  Anti-Infectives:   none  Subjective:    Damione Robideau relates his breathing is better than yesterday.  Objective:    Vitals:   05/28/19 0401 05/28/19 0419 05/28/19 0500 05/28/19 0600  BP:      Pulse: (!) 50 60 (!) 44 (!) 43  Resp:      Temp:      TempSrc:      SpO2: 90%  92% 93%  Weight:      Height:        Intake/Output Summary (Last 24 hours) at 05/28/2019 0714 Last data filed at 05/28/2019 0325 Gross per 24 hour  Intake 730 ml  Output 400 ml  Net 330 ml   Filed Weights   05/24/19 0507  Weight: 86 kg    Exam: General exam: In no acute distress. Respiratory system: Good air movement with diffuse crackles bilaterally Cardiovascular system: S1 & S2 heard, RRR. No JVD, murmurs, rubs, gallops or clicks.  Gastrointestinal system: Abdomen is nondistended, soft and nontender.  Central nervous system: Alert and oriented. No focal neurological deficits. Extremities: No pedal edema. Skin: No rashes, lesions or ulcers Psychiatry: Judgement and insight appear normal. Mood & affect appropriate.    Data Reviewed:    Labs: Basic Metabolic Panel: Recent Labs  Lab 05/24/19 0647 05/24/19 1507 05/25/19 0144 05/26/19 5573 05/27/19 0226 05/28/19  0410  NA 130* 135 133* 132* 135 134*  K 3.2* 3.8 3.2* 4.3 3.6 3.4*  CL 97* 98 93* 101 103 103  CO2 26 25 27  21* 20* 20*  GLUCOSE 228* 166* 95 333* 309* 293*  BUN 16 15 13 20  29* 26*  CREATININE 1.03 0.90 0.91 0.82 0.89 0.67  CALCIUM 7.4* 7.8* 8.1* 8.7* 8.9 8.3*  MG 1.9  --   --   --   --   --    GFR Estimated Creatinine Clearance: 118.7 mL/min (by C-G formula based on SCr of 0.67 mg/dL). Liver Function Tests: Recent Labs  Lab 05/24/19 0647 05/25/19 0144 05/26/19 0439 05/27/19 0226 05/28/19 0410  AST 269* 182* 112* 79* 85*  ALT 100* 87* 73* 65* 83*  ALKPHOS 113 125 140* 155* 150*   BILITOT 0.9 1.0 0.7 0.5 0.6  PROT 6.7 7.4 7.6 7.0 6.5  ALBUMIN 2.7* 3.0* 2.9* 2.7* 2.6*   Recent Labs  Lab 05/23/19 1905  LIPASE 59*   No results for input(s): AMMONIA in the last 168 hours. Coagulation profile No results for input(s): INR, PROTIME in the last 168 hours. COVID-19 Labs  Recent Labs    05/26/19 0438 05/26/19 0439 05/27/19 0226 05/28/19 0410  DDIMER  --  0.85* 0.75* 0.55*  FERRITIN 696*  --  493*  --   CRP 10.0*  --  4.9* 2.3*    Lab Results  Component Value Date   SARSCOV2NAA POSITIVE (A) 05/23/2019    CBC: Recent Labs  Lab 05/24/19 0647 05/25/19 0144 05/26/19 0439 05/27/19 0226 05/28/19 0410  WBC 4.2 6.5 5.1 13.0* 11.2*  NEUTROABS 1.7 3.7 3.9 11.3* 9.5*  HGB 15.4 16.3 17.0 15.6 15.8  HCT 42.6 46.1 46.4 45.1 43.6  MCV 95.1 97.1 96.9 98.7 98.2  PLT 114* 142* 152 172 181   Cardiac Enzymes: No results for input(s): CKTOTAL, CKMB, CKMBINDEX, TROPONINI in the last 168 hours. BNP (last 3 results) No results for input(s): PROBNP in the last 8760 hours. CBG: Recent Labs  Lab 05/27/19 0806 05/27/19 1205 05/27/19 1528 05/27/19 1652 05/27/19 2050  GLUCAP 237* 353* 389* 334* 206*   D-Dimer: Recent Labs    05/27/19 0226 05/28/19 0410  DDIMER 0.75* 0.55*   Hgb A1c: No results for input(s): HGBA1C in the last 72 hours. Lipid Profile: No results for input(s): CHOL, HDL, LDLCALC, TRIG, CHOLHDL, LDLDIRECT in the last 72 hours. Thyroid function studies: No results for input(s): TSH, T4TOTAL, T3FREE, THYROIDAB in the last 72 hours.  Invalid input(s): FREET3 Anemia work up: Recent Labs    05/26/19 0438 05/27/19 0226  FERRITIN 696* 493*   Sepsis Labs: Recent Labs  Lab 05/24/19 0029 05/24/19 0647 05/25/19 0144 05/26/19 0439 05/27/19 0226 05/28/19 0410  PROCALCITON  --  0.61  --   --   --   --   WBC  --  4.2 6.5 5.1 13.0* 11.2*  LATICACIDVEN 1.1  --   --   --   --   --    Microbiology Recent Results (from the past 240 hour(s))   SARS Coronavirus 2 (CEPHEID- Performed in [facility] hospital lab), Hosp Order     Status: Abnormal   Collection Time: 05/23/19 10:38 PM   Specimen: Nasopharyngeal Swab  Result Value Ref Range Status   SARS Coronavirus 2 POSITIVE (A) NEGATIVE Final    Comment: RESULT CALLED TO, READ BACK BY AND VERIFIED WITH: GRACIE WINEMAN ON 05/23/2019 AT 2351 QSD (NOTE) If result is NEGATIVE SARS-CoV-2 target nucleic  acids are NOT DETECTED. The SARS-CoV-2 RNA is generally detectable in upper and lower  respiratory specimens during the acute phase of infection. The lowest  concentration of SARS-CoV-2 viral copies this assay can detect is 250  copies / mL. A negative result does not preclude SARS-CoV-2 infection  and should not be used as the sole basis for treatment or other  patient management decisions.  A negative result may occur with  improper specimen collection / handling, submission of specimen other  than nasopharyngeal swab, presence of viral mutation(s) within the  areas targeted by this assay, and inadequate number of viral copies  (<250 copies / mL). A negative result must be combined with clinical  observations, patient history, and epidemiological information. If result is POSITIVE SARS-CoV-2 target nucleic acids are DETECTED.  The SARS-CoV-2 RNA is generally detectable in upper and lower  respiratory specimens during the acute phase of infection.  Positive  results are indicative of active infection with SARS-CoV-2.  Clinical  correlation with patient history and other diagnostic information is  necessary to determine patient infection status.  Positive results do  not rule out bacterial infection or co-infection with other viruses. If result is PRESUMPTIVE POSTIVE SARS-CoV-2 nucleic acids MAY BE PRESENT.   A presumptive positive result was obtained on the submitted specimen  and confirmed on repeat testing.  While 2019 novel coronavirus  (SARS-CoV-2) nucleic acids may be present  in the submitted sample  additional confirmatory testing may be necessary for epidemiological  and / or clinical management purposes  to differentiate between  SARS-CoV-2 and other Sarbecovirus currently known to infect humans.  If clinically indicated additional testing with an alternate test  methodology [phone]  is advised. The SARS-CoV-2 RNA is generally  detectable in upper and lower respiratory specimens during the acute  phase of infection. The expected result is Negative. Fact Sheet for Patients:  BoilerBrush.com.cyhttps://www.fda.gov/media/136312/download Fact Sheet for Healthcare Providers: https://pope.com/https://www.fda.gov/media/136313/download This test is not yet approved or cleared by the [place] FDA and has been authorized for detection and/or diagnosis of SARS-CoV-2 by FDA under an Emergency Use Authorization (EUA).  This EUA will remain in effect (meaning this test can be used) for the duration of the COVID-19 declaration under Section 564(b)(1) of the Act, 21 U.S.C. section 360bbb-3(b)(1), unless the authorization is terminated or revoked sooner. Performed at [facility], [address] [place], [place] [address]   Blood Culture (routine x 2)     Status: None (Preliminary result)   Collection Time: 05/24/19 12:29 AM   Specimen: BLOOD  Result Value Ref Range Status   Specimen Description BLOOD BLOOD RIGHT HAND  Final   Special Requests   Final    BOTTLES DRAWN AEROBIC AND ANAEROBIC Blood Culture adequate volume   Culture   Final    NO GROWTH 3 DAYS Performed at [facility], [address] [place], [place] [address]    Report Status PENDING  Incomplete  Blood Culture (routine x 2)     Status: None (Preliminary result)   Collection Time: 05/24/19 12:29 AM   Specimen: BLOOD  Result Value Ref Range Status   Specimen Description BLOOD RIGHT ANTECUBITAL  Final   Special Requests   Final    BOTTLES DRAWN AEROBIC AND ANAEROBIC Blood Culture adequate volume   Culture    Final    NO GROWTH 3 DAYS Performed at [facility], [address] [place], [place] [address]    Report Status PENDING  Incomplete  Medications:   . enoxaparin (LOVENOX) injection  40 mg Subcutaneous Q24H  . folic acid  1 mg Oral Daily  . insulin aspart  0-20 Units Subcutaneous TID WC  . insulin aspart  0-5 Units Subcutaneous QHS  . insulin aspart  6 Units Subcutaneous TID WC  . insulin detemir  30 Units Subcutaneous BID  . methylPREDNISolone (SOLU-MEDROL) injection  40 mg Intravenous Q12H  . multivitamin with minerals  1 tablet Oral Daily  . thiamine  100 mg Oral Daily   Or  . thiamine  100 mg Intravenous Daily  . vitamin C  500 mg Oral Daily  . zinc sulfate  220 mg Oral Daily   Continuous Infusions: . remdesivir 100 mg in NS 250 mL Stopped (05/27/19 1330)     LOS: 4 days   Marinda ElkAbraham Feliz Ortiz  [facility]  05/28/2019, 7:14 AM

## 2019-05-29 DIAGNOSIS — R740 Nonspecific elevation of levels of transaminase and lactic acid dehydrogenase [LDH]: Secondary | ICD-10-CM

## 2019-05-29 LAB — CBC WITH DIFFERENTIAL/PLATELET
Abs Immature Granulocytes: 0.05 10*3/uL (ref 0.00–0.07)
Basophils Absolute: 0 10*3/uL (ref 0.0–0.1)
Basophils Relative: 0 %
Eosinophils Absolute: 0 10*3/uL (ref 0.0–0.5)
Eosinophils Relative: 0 %
HCT: 45 % (ref 39.0–52.0)
Hemoglobin: 15.9 g/dL (ref 13.0–17.0)
Immature Granulocytes: 1 %
Lymphocytes Relative: 11 %
Lymphs Abs: 1.2 10*3/uL (ref 0.7–4.0)
MCH: 35 pg — ABNORMAL HIGH (ref 26.0–34.0)
MCHC: 35.3 g/dL (ref 30.0–36.0)
MCV: 99.1 fL (ref 80.0–100.0)
Monocytes Absolute: 0.7 10*3/uL (ref 0.1–1.0)
Monocytes Relative: 7 %
Neutro Abs: 8.7 10*3/uL — ABNORMAL HIGH (ref 1.7–7.7)
Neutrophils Relative %: 81 %
Platelets: 191 10*3/uL (ref 150–400)
RBC: 4.54 MIL/uL (ref 4.22–5.81)
RDW: 12.4 % (ref 11.5–15.5)
WBC: 10.7 10*3/uL — ABNORMAL HIGH (ref 4.0–10.5)
nRBC: 0 % (ref 0.0–0.2)

## 2019-05-29 LAB — GLUCOSE, CAPILLARY
Glucose-Capillary: 122 mg/dL — ABNORMAL HIGH (ref 70–99)
Glucose-Capillary: 166 mg/dL — ABNORMAL HIGH (ref 70–99)
Glucose-Capillary: 119 mg/dL — ABNORMAL HIGH (ref 70–99)
Glucose-Capillary: 164 mg/dL — ABNORMAL HIGH (ref 70–99)

## 2019-05-29 LAB — CULTURE, BLOOD (ROUTINE X 2)
Culture: NO GROWTH
Culture: NO GROWTH
Special Requests: ADEQUATE
Special Requests: ADEQUATE

## 2019-05-29 LAB — COMPREHENSIVE METABOLIC PANEL
ALT: 95 U/L — ABNORMAL HIGH (ref 0–44)
AST: 78 U/L — ABNORMAL HIGH (ref 15–41)
Albumin: 2.7 g/dL — ABNORMAL LOW (ref 3.5–5.0)
Alkaline Phosphatase: 138 U/L — ABNORMAL HIGH (ref 38–126)
Anion gap: 9 (ref 5–15)
BUN: 26 mg/dL — ABNORMAL HIGH (ref 6–20)
CO2: 23 mmol/L (ref 22–32)
Calcium: 8.6 mg/dL — ABNORMAL LOW (ref 8.9–10.3)
Chloride: 106 mmol/L (ref 98–111)
Creatinine, Ser: 0.63 mg/dL (ref 0.61–1.24)
GFR calc Af Amer: >60 mL/min (ref >60)
GFR calc non Af Amer: >60 mL/min (ref >60)
Glucose, Bld: 153 mg/dL — ABNORMAL HIGH (ref 70–99)
Potassium: 4 mmol/L (ref 3.5–5.1)
Sodium: 138 mmol/L (ref 135–145)
Total Bilirubin: 0.6 mg/dL (ref 0.3–1.2)
Total Protein: 6.7 g/dL (ref 6.5–8.1)

## 2019-05-29 LAB — FERRITIN: Ferritin: 398 ng/mL — ABNORMAL HIGH (ref 24–336)

## 2019-05-29 LAB — D-DIMER, QUANTITATIVE: D-Dimer, Quant: 0.55 ug/mL-FEU — ABNORMAL HIGH (ref 0.00–0.50)

## 2019-05-29 LAB — C-REACTIVE PROTEIN: CRP: 1.3 mg/dL — ABNORMAL HIGH (ref <1.0)

## 2019-05-29 MED ORDER — INSULIN STARTER KIT- PEN NEEDLES (SPANISH)
1.0000 | Freq: Once | Status: AC
  Administered 2019-05-29: 1
  Filled 2019-05-29: qty 1

## 2019-05-29 MED ORDER — LIVING WELL WITH DIABETES BOOK - IN SPANISH
Freq: Once | Status: AC
  Administered 2019-05-29: 17:00:00
  Filled 2019-05-29: qty 1

## 2019-05-29 NOTE — Progress Notes (Signed)
PROGRESS NOTE  Shawn Torres ELF:[id number] DOB: September 21, 1966 DOA: 05/24/2019 PCP: Patient, No Pcp Per   LOS: 5 days   Brief Narrative / Interim history: 53 year old male with no significant past medical history came to the hospital and was admitted on 05/24/2019 with 5 to 6 days of nausea, low-grade fevers as well as inability for any p.o. intake.  Chest x-ray in the ED showed bilateral infiltrates, he was covered positive, hypoxic requiring supplemental oxygen and was admitted to the hospital.  He was placed on steroids and Remdesivir  Subjective: Feeling a little bit better today, appreciates his breathing is improved.  Denies any chest pain.  Denies any abdominal pain, no nausea or vomiting.  He is eating well.  Assessment & Plan: Active Problems:   Hyponatremia   Pneumonia due to COVID-19 virus   Transaminitis   Alcohol dependence with uncomplicated withdrawal (HCC)   Hypokalemia   COVID-19   Principal Problem Acute Hypoxic Respiratory Failure due to Covid-19 Viral Illness -Stable on 2 L nasal cannula, patient receiving steroids as well as Remdesivir, continue -Inflammatory markers improving across the board, clinically improving -Attempt to wean to room air, have asked patient to ambulate  The treatment plan and use of medications and known side effects were discussed with patient/family, they were clearly explained that there is no proven definitive treatment for COVID-19 infection, any medications used here are based on published clinical articles/anecdotal data which are not peer-reviewed or randomized control trials.  Complete risks and long-term side effects are unknown, however in the best clinical judgment they seem to be of some clinical benefit rather than medical risks.  Patient agree with the treatment plan and want to receive the given medications.  COVID-19 Labs  Recent Labs    05/27/19 0226 05/28/19 0410 05/29/19 0154  DDIMER 0.75* 0.55* 0.55*  FERRITIN 493* 422* 398*   CRP 4.9* 2.3* 1.3*    Lab Results  Component Value Date   SARSCOV2NAA POSITIVE (A) 05/23/2019    Active Problems Hypovolemic hyponatremia -Improved with IV fluids, sodium has now normalized  Elevated LFTs  -overall stable, likely in the setting of a viral infection  Alcohol dependence -No significant signs of withdrawal, continue thiamine, folate  New diagnosis of type 2 diabetes mellitus -Hemoglobin A1c was 10.8, will likely need treatment at home  Scheduled Meds: . enoxaparin (LOVENOX) injection  40 mg Subcutaneous Q24H  . folic acid  1 mg Oral Daily  . insulin aspart  0-20 Units Subcutaneous TID WC  . insulin aspart  0-5 Units Subcutaneous QHS  . insulin aspart  6 Units Subcutaneous TID WC  . insulin detemir  45 Units Subcutaneous BID  . methylPREDNISolone (SOLU-MEDROL) injection  40 mg Intravenous Q12H  . multivitamin with minerals  1 tablet Oral Daily  . thiamine  100 mg Oral Daily   Or  . thiamine  100 mg Intravenous Daily  . vitamin C  500 mg Oral Daily  . zinc sulfate  220 mg Oral Daily   Continuous Infusions: . remdesivir 100 mg in NS 250 mL 100 mg (05/28/19 1408)   PRN Meds:.acetaminophen, chlorpheniramine-HYDROcodone, guaiFENesin-dextromethorphan, ondansetron **OR** ondansetron (ZOFRAN) IV  DVT prophylaxis: Lovenox Code Status: Full code Family Communication: Discussed with patient Disposition Plan: Home possibly 1 to 2 days  Consultants:   None  Procedures:   None   Antimicrobials:  None    Objective: Vitals:   05/29/19 0331 05/29/19 0354 05/29/19 0735 05/29/19 0828  BP: (!) 176/93 (!) 156/88 123/76  Pulse: (!) 44 (!) 48 (!) 52   Resp:   16   Temp:  (!) 97.4 F (36.3 C) 98.8 F (37.1 C)   TempSrc:  Oral Oral   SpO2: 93% 91% 91% 94%  Weight:      Height:        Intake/Output Summary (Last 24 hours) at 05/29/2019 1116 Last data filed at 05/29/2019 1002 Gross per 24 hour  Intake 1690 ml  Output -  Net 1690 ml   Filed Weights    05/24/19 0507  Weight: 86 kg    Examination:  Constitutional: NAD Eyes: PERRL, lids and conjunctivae normal ENMT: Mucous membranes are moist. No oropharyngeal exudates Neck: normal, supple Respiratory: clear to auscultation bilaterally, no wheezing, no crackles.  Cardiovascular: Regular rate and rhythm, no murmurs / rubs / gallops. No LE edema.  Abdomen: no tenderness. Bowel sounds positive.  Musculoskeletal: no clubbing / cyanosis. Skin: no rashes Neurologic: CN 2-12 grossly intact. Strength 5/5 in all 4.  Psychiatric: Normal judgment and insight. Alert and oriented x 3. Normal mood.    Data Reviewed: I have independently reviewed following labs and imaging studies   CBC: Recent Labs  Lab 05/25/19 0144 05/26/19 0439 05/27/19 0226 05/28/19 0410 05/29/19 0154  WBC 6.5 5.1 13.0* 11.2* 10.7*  NEUTROABS 3.7 3.9 11.3* 9.5* 8.7*  HGB 16.3 17.0 15.6 15.8 15.9  HCT 46.1 46.4 45.1 43.6 45.0  MCV 97.1 96.9 98.7 98.2 99.1  PLT 142* 152 172 181 191   Basic Metabolic Panel: Recent Labs  Lab 05/24/19 0647  05/25/19 0144 05/26/19 0439 05/27/19 0226 05/28/19 0410 05/29/19 0154  NA 130*   < > 133* 132* 135 134* 138  K 3.2*   < > 3.2* 4.3 3.6 3.4* 4.0  CL 97*   < > 93* 101 103 103 106  CO2 26   < > 27 21* 20* 20* 23  GLUCOSE 228*   < > 95 333* 309* 293* 153*  BUN 16   < > 13 20 29* 26* 26*  CREATININE 1.03   < > 0.91 0.82 0.89 0.67 0.63  CALCIUM 7.4*   < > 8.1* 8.7* 8.9 8.3* 8.6*  MG 1.9  --   --   --   --   --   --    < > = values in this interval not displayed.   GFR: Estimated Creatinine Clearance: 118.7 mL/min (by C-G formula based on SCr of 0.63 mg/dL). Liver Function Tests: Recent Labs  Lab 05/25/19 0144 05/26/19 0439 05/27/19 0226 05/28/19 0410 05/29/19 0154  AST 182* 112* 79* 85* 78*  ALT 87* 73* 65* 83* 95*  ALKPHOS 125 140* 155* 150* 138*  BILITOT 1.0 0.7 0.5 0.6 0.6  PROT 7.4 7.6 7.0 6.5 6.7  ALBUMIN 3.0* 2.9* 2.7* 2.6* 2.7*   Recent Labs  Lab  05/23/19 1905  LIPASE 59*   No results for input(s): AMMONIA in the last 168 hours. Coagulation Profile: No results for input(s): INR, PROTIME in the last 168 hours. Cardiac Enzymes: No results for input(s): CKTOTAL, CKMB, CKMBINDEX, TROPONINI in the last 168 hours. BNP (last 3 results) No results for input(s): PROBNP in the last 8760 hours. HbA1C: No results for input(s): HGBA1C in the last 72 hours. CBG: Recent Labs  Lab 05/28/19 0812 05/28/19 1148 05/28/19 1636 05/28/19 2049 05/29/19 0734  GLUCAP 237* 379* 276* 349* 122*   Lipid Profile: No results for input(s): CHOL, HDL, LDLCALC, TRIG, CHOLHDL, LDLDIRECT in the last 72 hours.  Thyroid Function Tests: No results for input(s): TSH, T4TOTAL, FREET4, T3FREE, THYROIDAB in the last 72 hours. Anemia Panel: Recent Labs    05/28/19 0410 05/29/19 0154  FERRITIN 422* 398*   Urine analysis:    Component Value Date/Time   COLORURINE AMBER (A) 05/23/2019 1905   APPEARANCEUR HAZY (A) 05/23/2019 1905   LABSPEC 1.026 05/23/2019 1905   PHURINE 6.0 05/23/2019 1905   GLUCOSEU >=500 (A) 05/23/2019 1905   HGBUR NEGATIVE 05/23/2019 1905   BILIRUBINUR NEGATIVE 05/23/2019 1905   KETONESUR NEGATIVE 05/23/2019 1905   PROTEINUR 100 (A) 05/23/2019 1905   NITRITE NEGATIVE 05/23/2019 1905   LEUKOCYTESUR NEGATIVE 05/23/2019 1905   Sepsis Labs: Invalid input(s): PROCALCITONIN, LACTICIDVEN  Recent Results (from the past 240 hour(s))  SARS Coronavirus 2 (CEPHEID- Performed in [facility] hospital lab), Hosp Order     Status: Abnormal   Collection Time: 05/23/19 10:38 PM   Specimen: Nasopharyngeal Swab  Result Value Ref Range Status   SARS Coronavirus 2 POSITIVE (A) NEGATIVE Final    Comment: RESULT CALLED TO, READ BACK BY AND VERIFIED WITH: GRACIE WINEMAN ON 05/23/2019 AT 2351 QSD (NOTE) If result is NEGATIVE SARS-CoV-2 target nucleic acids are NOT DETECTED. The SARS-CoV-2 RNA is generally detectable in upper and lower  respiratory  specimens during the acute phase of infection. The lowest  concentration of SARS-CoV-2 viral copies this assay can detect is 250  copies / mL. A negative result does not preclude SARS-CoV-2 infection  and should not be used as the sole basis for treatment or other  patient management decisions.  A negative result may occur with  improper specimen collection / handling, submission of specimen other  than nasopharyngeal swab, presence of viral mutation(s) within the  areas targeted by this assay, and inadequate number of viral copies  (<250 copies / mL). A negative result must be combined with clinical  observations, patient history, and epidemiological information. If result is POSITIVE SARS-CoV-2 target nucleic acids are DETECTED.  The SARS-CoV-2 RNA is generally detectable in upper and lower  respiratory specimens during the acute phase of infection.  Positive  results are indicative of active infection with SARS-CoV-2.  Clinical  correlation with patient history and other diagnostic information is  necessary to determine patient infection status.  Positive results do  not rule out bacterial infection or co-infection with other viruses. If result is PRESUMPTIVE POSTIVE SARS-CoV-2 nucleic acids MAY BE PRESENT.   A presumptive positive result was obtained on the submitted specimen  and confirmed on repeat testing.  While 2019 novel coronavirus  (SARS-CoV-2) nucleic acids may be present in the submitted sample  additional confirmatory testing may be necessary for epidemiological  and / or clinical management purposes  to differentiate between  SARS-CoV-2 and other Sarbecovirus currently known to infect humans.  If clinically indicated additional testing with an alternate test  methodology [phone]  is advised. The SARS-CoV-2 RNA is generally  detectable in upper and lower respiratory specimens during the acute  phase of infection. The expected result is Negative. Fact Sheet for  Patients:  BoilerBrush.com.cyhttps://www.fda.gov/media/136312/download Fact Sheet for Healthcare Providers: https://pope.com/https://www.fda.gov/media/136313/download This test is not yet approved or cleared by the [place] FDA and has been authorized for detection and/or diagnosis of SARS-CoV-2 by FDA under an Emergency Use Authorization (EUA).  This EUA will remain in effect (meaning this test can be used) for the duration of the COVID-19 declaration under Section 564(b)(1) of the Act, 21 U.S.C. section 360bbb-3(b)(1), unless the authorization is terminated or  revoked sooner. Performed at [facility], [address] [place], [place] [address]   Blood Culture (routine x 2)     Status: None   Collection Time: 05/24/19 12:29 AM   Specimen: BLOOD  Result Value Ref Range Status   Specimen Description BLOOD BLOOD RIGHT HAND  Final   Special Requests   Final    BOTTLES DRAWN AEROBIC AND ANAEROBIC Blood Culture adequate volume   Culture   Final    NO GROWTH 5 DAYS Performed at [facility], [address] [place], [place] [address]    Report Status 05/29/2019 FINAL  Final  Blood Culture (routine x 2)     Status: None   Collection Time: 05/24/19 12:29 AM   Specimen: BLOOD  Result Value Ref Range Status   Specimen Description BLOOD RIGHT ANTECUBITAL  Final   Special Requests   Final    BOTTLES DRAWN AEROBIC AND ANAEROBIC Blood Culture adequate volume   Culture   Final    NO GROWTH 5 DAYS Performed at [facility], [address] [place], [place] [address]    Report Status 05/29/2019 FINAL  Final      Radiology Studies: No results found.   Pamella Pertostin , MD, PhD [facility]  Contact via  [email or web address]  [facility] Office Info P: [phone] F: [phone]

## 2019-05-29 NOTE — Progress Notes (Signed)
Patient demonstrated how to give himself an insulin injection and was able to demonstrate how to give insulin pen on the insulin pen station.  Patient was also able to check his own blood sugar.  All instruction was done through interpreter.

## 2019-05-29 NOTE — Progress Notes (Addendum)
Patient SBP elevated to 170s this AM. Patient lying comfortably in bed with no complaints. BP recheck approx 20 min later and has improved slightly to 156/88. Patient has been bradycardic all night with rates 40-50s. Per chart review rates have been lower for past 24hrs. CIWAs negative. Patient tolerating prone positioning intermittently for a few hours at a time.

## 2019-05-29 NOTE — Progress Notes (Signed)
Inpatient Diabetes Program Recommendations  AACE/ADA: New Consensus Statement on Inpatient Glycemic Control (2015)  Target Ranges:  Prepandial:   less than 140 mg/dL      Peak postprandial:   less than 180 mg/dL (1-2 hours)      Critically ill patients:  140 - 180 mg/dL   Lab Results  Component Value Date   GLUCAP 166 (H) 05/29/2019   HGBA1C 10.8 (H) 05/24/2019    Review of Glycemic Control Results for BERGEN, MAGNER (MRN [id number]) as of 05/29/2019 12:41  Ref. Range 05/28/2019 11:48 05/28/2019 16:36 05/28/2019 20:49 05/29/2019 07:34 05/29/2019 11:24  Glucose-Capillary Latest Ref Range: 70 - 99 mg/dL 379 (H) 276 (H) 349 (H) 122 (H) 166 (H)   Diabetes history: New onset  Inpatient Diabetes Program Recommendations:   Spoke with patient via phone with interpretor # [id number]. Patient states willingness to take insulin if needed. States he can afford the cost of insulin and prefers insulin pens. Spoke with RN Durwin Nora and he is willing to teach patient in person insulin pen and syringe and evaluate patient's understanding. Ordered starter kit in [place]. Also ordered case manager consult to evaluate for resources regarding medication.  Thank you, Nani Gasser. Sandralee Tarkington, RN, MSN, CDE  Diabetes Coordinator Inpatient Glycemic Control Team Team Pager [phone] (8am-5pm) 05/29/2019 1:17 PM

## 2019-05-29 NOTE — Care Management (Signed)
CM consult for medication assistance acknowledged. CM will arrange a PCP appointment with [facility]; the patient will be able to utilize their pharmacy for discounted Rx for $4-$10. CM team will continue to follow.   Midge Minium RN, BSN, NCM-BC, ACM-RN [phone]

## 2019-05-29 NOTE — Progress Notes (Signed)
Patient ambulated in hallway for 15 minutes with no complaints of SOB or weakness. Maintained sats >90% on RA. Demonstrated with assistance how to check his blood sugar and administered nightly dose of insulin. Would benefit from further teaching.

## 2019-05-30 DIAGNOSIS — F10230 Alcohol dependence with withdrawal, uncomplicated: Secondary | ICD-10-CM

## 2019-05-30 DIAGNOSIS — E871 Hypo-osmolality and hyponatremia: Secondary | ICD-10-CM

## 2019-05-30 DIAGNOSIS — E876 Hypokalemia: Secondary | ICD-10-CM

## 2019-05-30 DIAGNOSIS — J988 Other specified respiratory disorders: Secondary | ICD-10-CM

## 2019-05-30 DIAGNOSIS — U071 COVID-19: Principal | ICD-10-CM

## 2019-05-30 DIAGNOSIS — J1289 Other viral pneumonia: Secondary | ICD-10-CM

## 2019-05-30 LAB — COMPREHENSIVE METABOLIC PANEL
ALT: 105 U/L — ABNORMAL HIGH (ref 0–44)
AST: 100 U/L — ABNORMAL HIGH (ref 15–41)
Albumin: 2.7 g/dL — ABNORMAL LOW (ref 3.5–5.0)
Alkaline Phosphatase: 116 U/L (ref 38–126)
Anion gap: 14 (ref 5–15)
BUN: 23 mg/dL — ABNORMAL HIGH (ref 6–20)
CO2: 23 mmol/L (ref 22–32)
Calcium: 8.3 mg/dL — ABNORMAL LOW (ref 8.9–10.3)
Chloride: 99 mmol/L (ref 98–111)
Creatinine, Ser: 0.61 mg/dL (ref 0.61–1.24)
GFR calc Af Amer: >60 mL/min (ref >60)
GFR calc non Af Amer: >60 mL/min (ref >60)
Glucose, Bld: 59 mg/dL — ABNORMAL LOW (ref 70–99)
Potassium: 3.5 mmol/L (ref 3.5–5.1)
Sodium: 136 mmol/L (ref 135–145)
Total Bilirubin: 0.7 mg/dL (ref 0.3–1.2)
Total Protein: 6.5 g/dL (ref 6.5–8.1)

## 2019-05-30 LAB — CBC
HCT: 46.7 % (ref 39.0–52.0)
Hemoglobin: 15.8 g/dL (ref 13.0–17.0)
MCH: 33.2 pg (ref 26.0–34.0)
MCHC: 33.8 g/dL (ref 30.0–36.0)
MCV: 98.1 fL (ref 80.0–100.0)
Platelets: 227 10*3/uL (ref 150–400)
RBC: 4.76 MIL/uL (ref 4.22–5.81)
RDW: 12.2 % (ref 11.5–15.5)
WBC: 9.4 10*3/uL (ref 4.0–10.5)
nRBC: 0 % (ref 0.0–0.2)

## 2019-05-30 LAB — D-DIMER, QUANTITATIVE: D-Dimer, Quant: 0.8 ug/mL-FEU — ABNORMAL HIGH (ref 0.00–0.50)

## 2019-05-30 LAB — FERRITIN: Ferritin: 375 ng/mL — ABNORMAL HIGH (ref 24–336)

## 2019-05-30 LAB — C-REACTIVE PROTEIN: CRP: 0.9 mg/dL (ref <1.0)

## 2019-05-30 LAB — GLUCOSE, CAPILLARY
Glucose-Capillary: 72 mg/dL (ref 70–99)
Glucose-Capillary: 365 mg/dL — ABNORMAL HIGH (ref 70–99)

## 2019-05-30 LAB — LACTATE DEHYDROGENASE: LDH: 241 U/L — ABNORMAL HIGH (ref 98–192)

## 2019-05-30 MED ORDER — METFORMIN HCL 500 MG PO TABS: 500.0000 mg | ORAL_TABLET | Freq: Two times a day (BID) | ORAL | 1 refills | Status: DC

## 2019-05-30 MED ORDER — DEXAMETHASONE 6 MG PO TABS
6.0000 mg | ORAL_TABLET | Freq: Every day | ORAL | Status: DC
  Administered 2019-05-30: 6 mg via ORAL
  Filled 2019-05-30: qty 1

## 2019-05-30 MED ORDER — INSULIN GLARGINE 100 UNITS/ML SOLOSTAR PEN: 10.0000 [IU] | PEN_INJECTOR | Freq: Every day | SUBCUTANEOUS | 1 refills | Status: DC

## 2019-05-30 MED ORDER — BLOOD GLUCOSE MONITOR KIT: PACK | 0 refills | Status: DC

## 2019-05-30 MED ORDER — DEXAMETHASONE 6 MG PO TABS: 6.0000 mg | ORAL_TABLET | Freq: Every day | ORAL | 0 refills | Status: DC

## 2019-05-30 MED ORDER — INSULIN DETEMIR 100 UNIT/ML ~~LOC~~ SOLN
20.0000 [IU] | Freq: Two times a day (BID) | SUBCUTANEOUS | Status: DC
  Administered 2019-05-30: 20 [IU] via SUBCUTANEOUS
  Filled 2019-05-30 (×2): qty 0.2

## 2019-05-30 NOTE — Discharge Instructions (Addendum)
[facility] in 5 to 7 days  Please get a complete blood count and chemistry panel checked by your Primary MD at your next visit, and again as instructed by your Primary MD. Please get your medications reviewed and adjusted by your Primary MD.  Please request your Primary MD to go over all Hospital Tests and Procedure/Radiological results at the follow up, please get all Hospital records sent to your Prim MD by signing hospital release before you go home.  In some cases, there will be blood work, cultures and biopsy results pending at the time of your discharge. Please request that your primary care M.D. goes through all the records of your hospital data and follows up on these results.  If you had Pneumonia of Lung problems at the Hospital: Please get a 2 view Chest X ray done in 6-8 weeks after hospital discharge or sooner if instructed by your Primary MD  If you have Congestive Heart Failure: Please call your Cardiologist or Primary MD anytime you have any of the following symptoms:  1) 3 pound weight gain in 24 hours or 5 pounds in 1 week  2) shortness of breath, with or without a dry hacking cough  3) swelling in the hands, feet or stomach  4) if you have to sleep on extra pillows at night in order to breathe  Follow cardiac low salt diet and 1.5 lit/day fluid restriction.  If you have diabetes Accuchecks 4 times/day, Once in AM empty stomach and then before each meal. Log in all results and show them to your primary doctor at your next visit. If any glucose reading is under 80 or above 300 call your primary MD immediately.  If you have Seizure/Convulsions/Epilepsy: Please do not drive, operate heavy machinery, participate in activities at heights or participate in high speed sports until you have seen by Primary MD or a Neurologist and advised to do so again. [place] [facility] [place] statutes, patients with seizures are not allowed to drive  until they have been seizure-free for six months.  Use caution when using heavy equipment or power tools. Avoid working on ladders or at heights. Take showers instead of baths. Ensure the water temperature is not too high on the home water heater. Do not go swimming alone. Do not lock yourself in a room alone (i.e. bathroom). When caring for infants or small children, sit down when holding, feeding, or changing them to minimize risk of injury to the child in the event you have a seizure. Maintain good sleep hygiene. Avoid alcohol.   If you had Gastrointestinal Bleeding: Please ask your Primary MD to check a complete blood count within one week of discharge or at your next visit. Your endoscopic/colonoscopic biopsies that are pending at the time of discharge, will also need to followed by your Primary MD.  Get Medicines reviewed and adjusted. Please take all your medications with you for your next visit with your Primary MD  Please request your Primary MD to go over all hospital tests and procedure/radiological results at the follow up, please ask your Primary MD to get all Hospital records sent to his/her office.  If you experience worsening of your admission symptoms, develop shortness of breath, life threatening emergency, suicidal or homicidal thoughts you must seek medical attention immediately by calling 911 or calling your MD immediately  if symptoms less severe.  You must read complete instructions/literature along with all the possible adverse reactions/side effects for  all the Medicines you take and that have been prescribed to you. Take any new Medicines after you have completely understood and accpet all the possible adverse reactions/side effects.   Do not drive or operate heavy machinery when taking Pain medications.   Do not take more than prescribed Pain, Sleep and Anxiety Medications  Special Instructions: If you have smoked or chewed Tobacco  in the last 2 yrs please stop smoking,  stop any regular Alcohol  and or any Recreational drug use.  Wear Seat belts while driving.  Please note You were cared for by a hospitalist during your hospital stay. If you have any questions about your discharge medications or the care you received while you were in the hospital after you are discharged, you can call the unit and asked to speak with the hospitalist on call if the hospitalist that took care of you is not available. Once you are discharged, your primary care physician will handle any further medical issues. Please note that NO REFILLS for any discharge medications will be authorized once you are discharged, as it is imperative that you return to your primary care physician (or establish a relationship with a primary care physician if you do not have one) for your aftercare needs so that they can reassess your need for medications and monitor your lab values.  You can reach the hospitalist office at phone [phone] or fax [phone]   If you do not have a primary care physician, you can call [phone] for a physician referral.  Activity: As tolerated with Full fall precautions use walker/cane & assistance as needed    Diet: diabetic  Disposition Home     Diabetes mellitus y nutricin, en adultos Diabetes Mellitus and Nutrition, Adult Si sufre de diabetes (diabetes mellitus), es muy importante tener hbitos alimenticios saludables debido a que sus niveles de Psychologist, counsellingazcar en la sangre (glucosa) se ven afectados en gran medida por lo que come y bebe. [place] alimentos saludables en las cantidades [place]adecuadas, aproximadamente a la Smith Internationalmisma hora todos los das, [place] ayudar a:  Scientist, physiologicalControlar la glucemia.  Disminuir el riesgo de sufrir una enfermedad cardaca.  Mejorar la presin arterial.  BaristaAlcanzar o mantener un peso saludable. Todas las personas que sufren de diabetes son diferentes y cada una tiene necesidades diferentes en cuanto a un plan de alimentacin. El mdico puede recomendarle  que trabaje con un especialista en dietas y nutricin (nutricionista) para Tax adviserelaborar el mejor plan para usted. Su plan de alimentacin puede variar segn factores como:  Las caloras que necesita.  Los medicamentos que toma.  Su peso.  Sus niveles de glucemia, presin arterial y colesterol.  Su nivel de [place]inesactividad.  Otras afecciones que tenga, como enfermedades cardacas o renales. Cmo me afectan los carbohidratos? Los carbohidratos, o hidratos de carbono, afectan su nivel de glucemia ms que cualquier otro tipo de alimento. La ingesta de carbohidratos naturalmente aumenta la cantidad de CarMaxglucosa en la sangre. El recuento de carbohidratos es un mtodo destinado a Midwifellevar un registro de la cantidad de carbohidratos que se consumen. El recuento de carbohidratos es importante para Pharmacologistmantener la glucemia a un nivel saludable, especialmente si utiliza insulina o toma determinados medicamentos por va oral para la diabetes. Es importante conocer la cantidad de carbohidratos que se pueden ingerir en cada comida sin correr Surveyor, mineralsningn riesgo. Esto es Government social research officerdiferente en cada persona. Su nutricionista puede ayudarlo a calcular la cantidad de carbohidratos que debe ingerir en cada comida y en cada refrigerio. Entre los ConocoPhillipsalimentos que  contienen carbohidratos, se incluyen:  Pan, cereal, arroz, pastas y galletas.  Papas y maz.  Guisantes, frijoles y lentejas.  Leche y Estate agent.  Lambert Mody y [place].  Postres, como pasteles, galletas, helado y caramelos. Cmo me afecta el alcohol? El alcohol puede provocar disminuciones sbitas de la glucemia (hipoglucemia), especialmente si utiliza insulina o toma determinados medicamentos por va oral para la diabetes. La hipoglucemia es una afeccin potencialmente mortal. Los sntomas de la hipoglucemia (somnolencia, mareos y confusin) son similares a los sntomas de haber consumido demasiado alcohol. Si el mdico afirma que el alcohol es seguro para usted, [place] estas pautas:  Limite  el consumo de alcohol a no ms de 53medida por da si es mujer y no est [place], y a 36medidas si es hombre. Una medida equivale a 12oz (369ml) de cerveza, 5oz (145ml) de vino o 1oz (44ml) de bebidas alcohlicas de alta graduacin.  No beba con el estmago vaco.  Mantngase hidratado bebiendo agua, refrescos dietticos o t helado sin azcar.  Tenga en cuenta que los refrescos comunes, los jugos y otras bebida para Optician, dispensing pueden contener mucha azcar y se deben contar como carbohidratos. Cules son algunos consejos para seguir este plan?   Leer las etiquetas de los alimentos  Comience por leer el tamao de la porcin en la Informacin nutricional en las etiquetas de los alimentos envasados y las bebidas. La cantidad de caloras, carbohidratos, grasas y otros nutrientes mencionados en la etiqueta se basan en una porcin del alimento. Muchos alimentos contienen ms de una porcin por envase.  Verifique la cantidad total de gramos (g) de carbohidratos totales en una porcin. Puede calcular la cantidad de porciones de carbohidratos al dividir el total de carbohidratos por 15. Por ejemplo, si un alimento tiene un total de 30g de carbohidratos, equivale a 2 porciones de carbohidratos.  Verifique la cantidad de gramos (g) de grasas saturadas y grasas trans en una porcin. Escoja alimentos que no contengan grasa o que tengan un bajo contenido.  Verifique la cantidad de miligramos (mg) de sal (sodio) en una porcin. La mayora de las personas deben limitar la ingesta de sodio total a menos de 2300mg  por Training and development officer.  Siempre consulte la informacin nutricional de los alimentos etiquetados como con bajo contenido de [place] o sin grasa. Estos alimentos pueden tener un mayor contenido de Location manager agregada o carbohidratos refinados, y deben evitarse.  Hable con su nutricionista para identificar sus objetivos diarios en cuanto a los nutrientes mencionados en la etiqueta. Al ir de  compras  Evite comprar alimentos procesados, enlatados o precocinados. Estos alimentos tienden a Special educational needs teacher mayor cantidad de [place], sodio y azcar agregada.  Compre en la zona exterior de la tienda de comestibles. Esta zona incluye frutas y verduras frescas, granos a granel, carnes frescas y productos lcteos frescos. Al cocinar  Utilice mtodos de coccin a baja temperatura, como hornear, en lugar de mtodos de coccin a alta temperatura, como frer en abundante aceite.  Cocine con aceites saludables, como el aceite de [place], canola o [place].  Evite cocinar con manteca, crema o carnes con alto contenido de grasa. Planificacin de las comidas  Coma las comidas y los refrigerios regularmente, preferentemente a la misma hora todos [place]. Evite pasar largos perodos de tiempo sin comer.  Consuma alimentos ricos en fibra, como frutas frescas, verduras, frijoles y cereales integrales. Consulte a su nutricionista sobre cuntas porciones de carbohidratos puede consumir en cada comida.  Consuma entre 4 y 6 onzas (oz) de Advertising account planner por  da, como carnes [place]agras, pollo, pescado, huevos o tofu. Una onza de protena magra equivale a: ? 1 onza de carne, pollo o pescado. ? 1huevo. ?  taza de tofu.  Coma algunos alimentos por da que contengan grasas saludables, como aguacates, frutos secos, semillas y pescado. Estilo de vida  Controle su nivel de glucemia con regularidad.  Haga actividad fsica habitualmente como se lo haya indicado el mdico. Esto puede incluir lo siguiente: ? 150minutos semanales de ejercicio de intensidad moderada o alta. Esto podra incluir caminatas dinmicas, ciclismo o gimnasia acutica. ? Realizar ejercicios de elongacin y de fortalecimiento, como yoga o levantamiento de pesas, por lo menos 2veces por semana.  Tome los Monsanto Companymedicamentos como se lo haya indicado el mdico.  No consuma ningn producto que contenga nicotina o tabaco, como cigarrillos y Soil scientistcigarrillos  electrnicos. Si necesita ayuda para dejar de fumar, consulte al CIGNAmdico.  Trabaje con un asesor o instructor en diabetes para identificar estrategias para controlar el estrs y cualquier desafo emocional y social. Preguntas para hacerle al mdico  Es necesario que consulte a IT trainerun instructor en el cuidado de la diabetes?  Es necesario que me rena con un nutricionista?  A qu nmero puedo llamar si tengo preguntas?  Cules son los mejores momentos para controlar la glucemia? Dnde encontrar ms informacin:  Asociacin Estadounidense de la Diabetes (American Diabetes Association): diabetes.org  Academia de Nutricin y Pension scheme managerDiettica (Academy of Nutrition and Dietetics): www.eatright.org  The Krogernstituto Nacional de la Diabetes y las Enfermedades Digestivas y [facility](National Institute of Diabetes and Digestive and Kidney Diseases, NIH): CarFlippers.tnwww.niddk.nih.gov Resumen  Un plan de alimentacin saludable lo ayudar a Scientist, physiologicalcontrolar la glucemia y Pharmacologistmantener un estilo de vida saludable.  Trabajar con un especialista en dietas y nutricin (nutricionista) puede ayudarlo a Designer, television/film setelaborar el mejor plan de alimentacin para usted.  Tenga en cuenta que los carbohidratos (hidratos de carbono) y el alcohol tienen efectos inmediatos en sus niveles de glucemia. Es importante contar los carbohidratos que ingiere y consumir alcohol con prudencia. Esta informacin no tiene Theme park managercomo fin reemplazar el consejo del mdico. Asegrese de hacerle al mdico cualquier pregunta que tenga. Document Released: 02/14/2008 Document Revised: 07/18/2017 Document Reviewed: 02/27/2017 Elsevier Patient Education  2020 Arvin[employer]itorElsevier Inc.

## 2019-05-30 NOTE — Plan of Care (Signed)
Problem: Education: Goal: Knowledge of General Education information will improve Description: Including pain rating scale, medication(s)/side effects and non-pharmacologic comfort measures Outcome: Adequate for Discharge   Problem: Health Behavior/Discharge Planning: Goal: Ability to manage health-related needs will improve Outcome: Adequate for Discharge   Problem: Clinical Measurements: Goal: Will remain free from infection Outcome: Adequate for Discharge Goal: Diagnostic test results will improve Outcome: Adequate for Discharge Goal: Respiratory complications will improve Outcome: Adequate for Discharge   Problem: Nutrition: Goal: Adequate nutrition will be maintained Outcome: Adequate for Discharge   Problem: Safety: Goal: Ability to remain free from injury will improve Outcome: Adequate for Discharge   Problem: Skin Integrity: Goal: Risk for impaired skin integrity will decrease Outcome: Adequate for Discharge   Problem: Education: Goal: Ability to describe self-care measures that may prevent or decrease complications (Diabetes Survival Skills Education) will improve Outcome: Adequate for Discharge Goal: Individualized Educational Video(s) Outcome: Adequate for Discharge   Problem: Coping: Goal: Ability to adjust to condition or change in health will improve Outcome: Adequate for Discharge   Problem: Fluid Volume: Goal: Ability to maintain a balanced intake and output will improve Outcome: Adequate for Discharge   Problem: Health Behavior/Discharge Planning: Goal: Ability to identify and utilize available resources and services will improve Outcome: Adequate for Discharge Goal: Ability to manage health-related needs will improve Outcome: Adequate for Discharge   Problem: Metabolic: Goal: Ability to maintain appropriate glucose levels will improve Outcome: Adequate for Discharge   Problem: Nutritional: Goal: Maintenance of adequate nutrition will  improve Outcome: Adequate for Discharge Goal: Progress toward achieving an optimal weight will improve Outcome: Adequate for Discharge   Problem: Skin Integrity: Goal: Risk for impaired skin integrity will decrease Outcome: Adequate for Discharge   Problem: Tissue Perfusion: Goal: Adequacy of tissue perfusion will improve Outcome: Adequate for Discharge   Problem: Education: Goal: Knowledge of risk factors and measures for prevention of condition will improve Outcome: Adequate for Discharge   Problem: Coping: Goal: Psychosocial and spiritual needs will be supported Outcome: Adequate for Discharge   Problem: Respiratory: Goal: Will maintain a patent airway Outcome: Adequate for Discharge Goal: Complications related to the disease process, condition or treatment will be avoided or minimized Outcome: Adequate for Discharge   Problem: Education: Goal: Knowledge of General Education information will improve Description: Including pain rating scale, medication(s)/side effects and non-pharmacologic comfort measures Outcome: Adequate for Discharge   Problem: Health Behavior/Discharge Planning: Goal: Ability to manage health-related needs will improve Outcome: Adequate for Discharge   Problem: Clinical Measurements: Goal: Ability to maintain clinical measurements within normal limits will improve Outcome: Adequate for Discharge Goal: Will remain free from infection Outcome: Adequate for Discharge Goal: Diagnostic test results will improve Outcome: Adequate for Discharge Goal: Respiratory complications will improve Outcome: Adequate for Discharge Goal: Cardiovascular complication will be avoided Outcome: Adequate for Discharge   Problem: Activity: Goal: Risk for activity intolerance will decrease Outcome: Adequate for Discharge   Problem: Nutrition: Goal: Adequate nutrition will be maintained Outcome: Adequate for Discharge   Problem: Coping: Goal: Level of anxiety will  decrease Outcome: Adequate for Discharge   Problem: Elimination: Goal: Will not experience complications related to bowel motility Outcome: Adequate for Discharge Goal: Will not experience complications related to urinary retention Outcome: Adequate for Discharge   Problem: Pain Managment: Goal: General experience of comfort will improve Outcome: Adequate for Discharge   Problem: Safety: Goal: Ability to remain free from injury will improve Outcome: Adequate for Discharge   Problem: Skin Integrity: Goal: Risk  for impaired skin integrity will decrease Outcome: Adequate for Discharge

## 2019-05-30 NOTE — Care Management (Signed)
CM arranged a hospital follow-up televisit appointment at: [facility] on 06/04/19 @ 1300 to establish a new PCP and for medication assistance at the [facility]; AVS updated.  Midge Minium RN, BSN, NCM-BC, ACM-RN [phone]

## 2019-05-30 NOTE — Discharge Summary (Signed)
Physician Discharge Summary  [name] OIZ:[id number] DOB: [birth year] DOA: 05/24/2019  PCP: Shawn Torres, No Pcp Per  Admit date: 05/24/2019 Discharge date: 05/30/2019  Admitted From: home Disposition:  home  Recommendations for Outpatient Follow-up:  1. Follow up with PCP in 1-2 weeks, Shawn Torres will be set [facility]  Home Health: None Equipment/Devices: None  Discharge Condition: Stable CODE STATUS: Full code Diet recommendation: Carb modified, diabetic  HPI: Per admitting MD, Shawn Torres is a 53 y.o. M with no significant PMH who presents with 5-6 days of malaise, now unable to keep any food down. All history collected through video phonic interpreter. The Shawn Torres was in his usual state of health, until about 5 or 6 days ago when he started to develop malaise, fatigue, aches, and low-grade fevers.  In the last 4 days, this is progressed until he vomits frequently, cannot keep any food or liquids down so he went to UC.  No dyspnea with exertion, cough mild, no abdominal pain. From UC he was noted to desat to 88% with exertion, so was sent to the ER, where chest x-ray showed bilateral opacities.  He had temp 100.64F, HR 105, Na 125, K 3.3.  COVID+ He was given 2.5L IV fluids, Ativan for alcohol withdrawal, CTX and azithromcyin for empiric coverage of CAP and [facility] were asked to accept Shawn Torres in transfer.  Hospital Course: Acute Hypoxic Respiratory Failure due to Covid-19 Viral Illness -Shawn Torres was admitted to the hospital with hypoxia due to viral pneumonia, he was placed on treatment with steroids along with Remdesivir, he completed antiviral treatment while hospitalized and improved significantly, he was weaned off to room air, able to ambulate in the hallway without shortness of breath/dyspnea.  His inflammatory markers are gradually trending down, he will be transitioned to Decadron for 3 additional days to complete a 10-day  course  COVID-19 Labs  Recent Labs    05/28/19 0410 05/29/19 0154 05/30/19 0300 05/30/19 0330  DDIMER 0.55* 0.55* 0.80*  --   FERRITIN 422* 398*  --  375*  LDH  --   --  241*  --   CRP 2.3* 1.3*  --  0.9    Lab Results  Component Value Date   SARSCOV2NAA POSITIVE (A) 05/23/2019   Active Problems Hypovolemic hyponatremia -Improved with IV fluids, sodium has now normalized Elevated LFTs -overall stable, likely in the setting of a viral infection Alcohol dependence -No significant signs of withdrawal, counseled for cessation New diagnosis of type 2 diabetes mellitus -Hemoglobin A1c was 10.8, in a range that will require insulin, he was placed initially on higher dose of long-acting given steroids in the hospital but dose titrated down, and he will go home on 10 units of Lantus along with metformin, was advised to check his CBGs 4 times daily and present sugar log with his PCP follow-up  Discharge Diagnoses:  Active Problems:   Hyponatremia   Pneumonia due to COVID-19 virus   Transaminitis   Alcohol dependence with uncomplicated withdrawal ([place])   Hypokalemia   COVID-19   Discharge Instructions  Allergies as of 05/30/2019   No Known Allergies     Medication List    TAKE these medications   blood glucose meter kit and supplies Kit Dispense based on Shawn Torres and insurance preference. Check CBGs before each meal and before bedtime. Log in your results and show to your PCP in 1 week   dexamethasone 6 MG tablet  Commonly known as: DECADRON Take 1 tablet (6 mg total) by mouth daily. Start taking on: May 31, 2019   insulin glargine 100 unit/mL Sopn Commonly known as: LANTUS Inject 0.1 mLs (10 Units total) into the skin daily. Please dispense 100 appropriate needles as well.   metFORMIN 500 MG tablet Commonly known as: Glucophage Take 1 tablet (500 mg total) by mouth 2 (two) times daily with a meal.      Follow-up Information    [place]. Call.   Why: to arrange a hospital telephone appointment; you can use [facility] for discounted prescriptions for $4-$10 Contact information: [address] [place] [address] [phone]         Consultations:  None   Procedures/Studies:  Dg Chest 2 View  Result Date: 05/23/2019 CLINICAL DATA:  Cough EXAM: CHEST - 2 VIEW COMPARISON:  None. FINDINGS: There are multifocal airspace opacities bilaterally. No pneumothorax. No large pleural effusion. The heart size is enlarged. There is no acute osseous abnormality detected on this exam. IMPRESSION: Multifocal airspace opacities concerning for multifocal pneumonia (viral or bacterial). Electronically Signed   By: Constance Holster M.D.   On: 05/23/2019 17:47     Subjective: - no chest pain, shortness of breath, no abdominal pain, nausea or vomiting.   Discharge Exam: BP 112/74 (BP Location: Right Arm)   Pulse (!) 52   Temp (!) 97.5 F (36.4 C) (Oral)   Resp 16   Ht '5\' 9"'  (1.753 m)   Wt 86 kg   SpO2 93%   BMI 27.98 kg/m   General: Pt is alert, awake, not in acute distress Cardiovascular: RRR, S1/S2 +, no rubs, no gallops Respiratory: CTA bilaterally, no wheezing, no rhonchi Abdominal: Soft, NT, ND, bowel sounds + Extremities: no edema, no cyanosis   The results of significant diagnostics from this hospitalization (including imaging, microbiology, ancillary and laboratory) are listed below for reference.     Microbiology: Recent Results (from the past 240 hour(s))  SARS Coronavirus 2 (CEPHEID- Performed in [place] hospital lab), Hosp Order     Status: Abnormal   Collection Time: 05/23/19 10:38 PM   Specimen: Nasopharyngeal Swab  Result Value Ref Range Status   SARS Coronavirus 2 POSITIVE (A) NEGATIVE Final    Comment: RESULT CALLED TO, READ BACK BY AND VERIFIED WITH: GRACIE WINEMAN ON 05/23/2019 AT 2351 QSD (NOTE) If result is NEGATIVE SARS-CoV-2 target  nucleic acids are NOT DETECTED. The SARS-CoV-2 RNA is generally detectable in upper and lower  respiratory specimens during the acute phase of infection. The lowest  concentration of SARS-CoV-2 viral copies this assay can detect is 250  copies / mL. A negative result does not preclude SARS-CoV-2 infection  and should not be used as the sole basis for treatment or other  Shawn Torres management decisions.  A negative result may occur with  improper specimen collection / handling, submission of specimen other  than nasopharyngeal swab, presence of viral mutation(s) within the  areas targeted by this assay, and inadequate number of viral copies  (<250 copies / mL). A negative result must be combined with clinical  observations, Shawn Torres history, and epidemiological information. If result is POSITIVE SARS-CoV-2 target nucleic acids are DETECTED.  The SARS-CoV-2 RNA is generally detectable in upper and lower  respiratory specimens during the acute phase of infection.  Positive  results are indicative of active infection with SARS-CoV-2.  Clinical  correlation with Shawn Torres history and other  diagnostic information is  necessary to determine Shawn Torres infection status.  Positive results do  not rule out bacterial infection or co-infection with other viruses. If result is PRESUMPTIVE POSTIVE SARS-CoV-2 nucleic acids MAY BE PRESENT.   A presumptive positive result was obtained on the submitted specimen  and confirmed on repeat testing.  While 2019 novel coronavirus  (SARS-CoV-2) nucleic acids may be present in the submitted sample  additional confirmatory testing may be necessary for epidemiological  and / or clinical management purposes  to differentiate between  SARS-CoV-2 and other Sarbecovirus currently known to infect humans.  If clinically indicated additional testing with an alternate test  methodology [phone])  is advised. The SARS-CoV-2 RNA is generally  detectable in upper and lower  respiratory specimens during the acute  phase of infection. The expected result is Negative. Fact Sheet for Patients:  StrictlyIdeas.no Fact Sheet for Healthcare Providers: BankingDealers.co.za This test is not yet approved or cleared by the [place] FDA and has been authorized for detection and/or diagnosis of SARS-CoV-2 by FDA under an Emergency Use Authorization (EUA).  This EUA will remain in effect (meaning this test can be used) for the duration of the COVID-19 declaration under Section 564(b)(1) of the Act, 21 U.S.C. section 360bbb-3(b)(1), unless the authorization is terminated or revoked sooner. Performed at [facility], [address] [place], [place] [address]   Blood Culture (routine x 2)     Status: None   Collection Time: 05/24/19 12:29 AM   Specimen: BLOOD  Result Value Ref Range Status   Specimen Description BLOOD BLOOD RIGHT HAND  Final   Special Requests   Final    BOTTLES DRAWN AEROBIC AND ANAEROBIC Blood Culture adequate volume   Culture   Final    NO GROWTH 5 DAYS Performed at [facility], [address], [place], [place] [address]    Report Status 05/29/2019 FINAL  Final  Blood Culture (routine x 2)     Status: None   Collection Time: 05/24/19 12:29 AM   Specimen: BLOOD  Result Value Ref Range Status   Specimen Description BLOOD RIGHT ANTECUBITAL  Final   Special Requests   Final    BOTTLES DRAWN AEROBIC AND ANAEROBIC Blood Culture adequate volume   Culture   Final    NO GROWTH 5 DAYS Performed at [facility], [address], [place], [place] [address]    Report Status 05/29/2019 FINAL  Final     Labs: BNP (last 3 results) No results for input(s): BNP in the last 8760 hours. Basic Metabolic Panel: Recent Labs  Lab 05/24/19 0647  05/26/19 0439 05/27/19 0226 05/28/19 0410 05/29/19 0154 05/30/19 0300  NA 130*   < > 132* 135 134* 138 136  K 3.2*   < > 4.3 3.6  3.4* 4.0 3.5  CL 97*   < > 101 103 103 106 99  CO2 26   < > 21* 20* 20* 23 23  GLUCOSE 228*   < > 333* 309* 293* 153* 59*  BUN 16   < > 20 29* 26* 26* 23*  CREATININE 1.03   < > 0.82 0.89 0.67 0.63 0.61  CALCIUM 7.4*   < > 8.7* 8.9 8.3* 8.6* 8.3*  MG 1.9  --   --   --   --   --   --    < > = values in this interval not displayed.   Liver Function Tests: Recent Labs  Lab 05/26/19 0439 05/27/19 0226  05/28/19 0410 05/29/19 0154 05/30/19 0300  AST 112* 79* 85* 78* 100*  ALT 73* 65* 83* 95* 105*  ALKPHOS 140* 155* 150* 138* 116  BILITOT 0.7 0.5 0.6 0.6 0.7  PROT 7.6 7.0 6.5 6.7 6.5  ALBUMIN 2.9* 2.7* 2.6* 2.7* 2.7*   Recent Labs  Lab 05/23/19 1905  LIPASE 59*   No results for input(s): AMMONIA in the last 168 hours. CBC: Recent Labs  Lab 05/25/19 0144 05/26/19 0439 05/27/19 0226 05/28/19 0410 05/29/19 0154 05/30/19 0300  WBC 6.5 5.1 13.0* 11.2* 10.7* 9.4  NEUTROABS 3.7 3.9 11.3* 9.5* 8.7*  --   HGB 16.3 17.0 15.6 15.8 15.9 15.8  HCT 46.1 46.4 45.1 43.6 45.0 46.7  MCV 97.1 96.9 98.7 98.2 99.1 98.1  PLT 142* 152 172 181 191 227   Cardiac Enzymes: No results for input(s): CKTOTAL, CKMB, CKMBINDEX, TROPONINI in the last 168 hours. BNP: Invalid input(s): POCBNP CBG: Recent Labs  Lab 05/28/19 2049 05/29/19 0734 05/29/19 1124 05/29/19 1650 05/29/19 2102  GLUCAP 349* 122* 166* 119* 164*   D-Dimer Recent Labs    05/29/19 0154 05/30/19 0300  DDIMER 0.55* 0.80*   Hgb A1c No results for input(s): HGBA1C in the last 72 hours. Lipid Profile No results for input(s): CHOL, HDL, LDLCALC, TRIG, CHOLHDL, LDLDIRECT in the last 72 hours. Thyroid function studies No results for input(s): TSH, T4TOTAL, T3FREE, THYROIDAB in the last 72 hours.  Invalid input(s): FREET3 Anemia work up Recent Labs    05/29/19 0154 05/30/19 0330  FERRITIN 398* 375*   Urinalysis    Component Value Date/Time   COLORURINE AMBER (A) 05/23/2019 1905   APPEARANCEUR HAZY (A) 05/23/2019  1905   LABSPEC 1.026 05/23/2019 1905   PHURINE 6.0 05/23/2019 1905   GLUCOSEU >=500 (A) 05/23/2019 1905   HGBUR NEGATIVE 05/23/2019 1905   BILIRUBINUR NEGATIVE 05/23/2019 1905   KETONESUR NEGATIVE 05/23/2019 1905   PROTEINUR 100 (A) 05/23/2019 1905   NITRITE NEGATIVE 05/23/2019 1905   LEUKOCYTESUR NEGATIVE 05/23/2019 1905   Sepsis Labs Invalid input(s): PROCALCITONIN,  WBC,  LACTICIDVEN  FURTHER DISCHARGE INSTRUCTIONS:   Get Medicines reviewed and adjusted: Please take all your medications with you for your next visit with your Primary MD   Laboratory/radiological data: Please request your Primary MD to go over all hospital tests and procedure/radiological results at the follow up, please ask your Primary MD to get all Hospital records sent to his/her office.   In some cases, they will be blood work, cultures and biopsy results pending at the time of your discharge. Please request that your primary care M.D. goes through all the records of your hospital data and follows up on these results.   Also Note the following: If you experience worsening of your admission symptoms, develop shortness of breath, life threatening emergency, suicidal or homicidal thoughts you must seek medical attention immediately by calling 911 or calling your MD immediately  if symptoms less severe.   You must read complete instructions/literature along with all the possible adverse reactions/side effects for all the Medicines you take and that have been prescribed to you. Take any new Medicines after you have completely understood and accpet all the possible adverse reactions/side effects.    Do not drive when taking Pain medications or sleeping medications (Benzodaizepines)   Do not take more than prescribed Pain, Sleep and Anxiety Medications. It is not advisable to combine anxiety,sleep and pain medications without talking with your primary care practitioner   Special Instructions: If you have  smoked or  chewed Tobacco  in the last 2 yrs please stop smoking, stop any regular Alcohol  and or any Recreational drug use.   Wear Seat belts while driving.   Please note: You were cared for by a hospitalist during your hospital stay. Once you are discharged, your primary care physician will handle any further medical issues. Please note that NO REFILLS for any discharge medications will be authorized once you are discharged, as it is imperative that you return to your primary care physician (or establish a relationship with a primary care physician if you do not have one) for your post hospital discharge needs so that they can reassess your need for medications and monitor your lab values.  Time coordinating discharge: 40 minutes  SIGNED:  Marzetta Board, MD, PhD 05/30/2019, 10:46 AM

## 2021-04-12 ENCOUNTER — Encounter: Payer: Self-pay | Admitting: Emergency Medicine

## 2021-04-12 ENCOUNTER — Emergency Department: Payer: Self-pay

## 2021-04-12 ENCOUNTER — Other Ambulatory Visit: Payer: Self-pay

## 2021-04-12 ENCOUNTER — Emergency Department
Admission: EM | Admit: 2021-04-12 | Discharge: 2021-04-12 | Disposition: A | Discharge status: Home / Self Care | Payer: Self-pay | Attending: Emergency Medicine | Admitting: Emergency Medicine

## 2021-04-12 DIAGNOSIS — R609 Edema, unspecified: Secondary | ICD-10-CM

## 2021-04-12 DIAGNOSIS — Z8616 Personal history of COVID-19: Secondary | ICD-10-CM | POA: Insufficient documentation

## 2021-04-12 DIAGNOSIS — E119 Type 2 diabetes mellitus without complications: Secondary | ICD-10-CM | POA: Insufficient documentation

## 2021-04-12 DIAGNOSIS — Z794 Long term (current) use of insulin: Secondary | ICD-10-CM | POA: Insufficient documentation

## 2021-04-12 DIAGNOSIS — Z7984 Long term (current) use of oral hypoglycemic drugs: Secondary | ICD-10-CM | POA: Insufficient documentation

## 2021-04-12 DIAGNOSIS — R2243 Localized swelling, mass and lump, lower limb, bilateral: Secondary | ICD-10-CM | POA: Insufficient documentation

## 2021-04-12 LAB — CBC WITH DIFFERENTIAL/PLATELET
Abs Immature Granulocytes: 0.01 10*3/uL (ref 0.00–0.07)
Basophils Absolute: 0.1 10*3/uL (ref 0.0–0.1)
Basophils Relative: 2 %
Eosinophils Absolute: 0.1 10*3/uL (ref 0.0–0.5)
Eosinophils Relative: 1 %
HCT: 31.7 % — ABNORMAL LOW (ref 39.0–52.0)
Hemoglobin: 11 g/dL — ABNORMAL LOW (ref 13.0–17.0)
Immature Granulocytes: 0 %
Lymphocytes Relative: 31 %
Lymphs Abs: 1.7 10*3/uL (ref 0.7–4.0)
MCH: 33.8 pg (ref 26.0–34.0)
MCHC: 34.7 g/dL (ref 30.0–36.0)
MCV: 97.5 fL (ref 80.0–100.0)
Monocytes Absolute: 0.4 10*3/uL (ref 0.1–1.0)
Monocytes Relative: 7 %
Neutro Abs: 3.2 10*3/uL (ref 1.7–7.7)
Neutrophils Relative %: 59 %
Platelets: 151 10*3/uL (ref 150–400)
RBC: 3.25 MIL/uL — ABNORMAL LOW (ref 4.22–5.81)
RDW: 13.5 % (ref 11.5–15.5)
WBC: 5.4 10*3/uL (ref 4.0–10.5)
nRBC: 0 % (ref 0.0–0.2)

## 2021-04-12 LAB — BASIC METABOLIC PANEL
Anion gap: 8 (ref 5–15)
BUN: 7 mg/dL (ref 6–20)
CO2: 21 mmol/L — ABNORMAL LOW (ref 22–32)
Calcium: 7.5 mg/dL — ABNORMAL LOW (ref 8.9–10.3)
Chloride: 107 mmol/L (ref 98–111)
Creatinine, Ser: 0.66 mg/dL (ref 0.61–1.24)
GFR, Estimated: >60 mL/min (ref >60)
Glucose, Bld: 111 mg/dL — ABNORMAL HIGH (ref 70–99)
Potassium: 3.8 mmol/L (ref 3.5–5.1)
Sodium: 136 mmol/L (ref 135–145)

## 2021-04-12 LAB — HEPATIC FUNCTION PANEL
ALT: 25 U/L (ref 0–44)
AST: 65 U/L — ABNORMAL HIGH (ref 15–41)
Albumin: 2 g/dL — ABNORMAL LOW (ref 3.5–5.0)
Alkaline Phosphatase: 93 U/L (ref 38–126)
Bilirubin, Direct: 0.9 mg/dL — ABNORMAL HIGH (ref 0.0–0.2)
Indirect Bilirubin: 1.7 mg/dL — ABNORMAL HIGH (ref 0.3–0.9)
Total Bilirubin: 2.6 mg/dL — ABNORMAL HIGH (ref 0.3–1.2)
Total Protein: 7.6 g/dL (ref 6.5–8.1)

## 2021-04-12 LAB — BRAIN NATRIURETIC PEPTIDE: B Natriuretic Peptide: 80.8 pg/mL (ref 0.0–100.0)

## 2021-04-12 MED ORDER — FUROSEMIDE 20 MG PO TABS: 20.0000 mg | ORAL_TABLET | Freq: Two times a day (BID) | ORAL | 0 refills | Status: DC

## 2021-04-12 MED ORDER — FUROSEMIDE 40 MG PO TABS
40.0000 mg | ORAL_TABLET | Freq: Once | ORAL | Status: AC
  Administered 2021-04-12: 40 mg via ORAL
  Filled 2021-04-12: qty 1

## 2021-04-12 NOTE — ED Notes (Signed)
Spanish Interpreter and MD at bedside

## 2021-04-12 NOTE — ED Notes (Signed)
Pt calm , collective , denied pain or sob. Discharge instructions reviewed with pt and daughter

## 2021-04-12 NOTE — ED Triage Notes (Signed)
Pt to ED via POV, pt states that he is having problems with both of his legs swelling since yesterday. Pt states that it is very painful. Pt states that he does not have hx/o heart failure. Pt denies shortness of breath or chest pain. Pt is able to speak in complete sentences at this time.

## 2021-04-12 NOTE — ED Provider Notes (Signed)
[facility] Emergency Department Provider Note   ____________________________________________   Event Date/Time   First MD Initiated Contact with Patient 04/12/21 1416     (approximate)  I have reviewed the triage vital signs and the nursing notes.   HISTORY  Chief Complaint Leg Swelling    HPI Shawn Torres is a 55 y.o. male with past medical history of diabetes and alcohol abuse who presents to the ED complaining of leg swelling.  History is limited as patient is Spanish-speaking only, history obtained via in person interpreter.  Patient states that for the past 3 days he has noticed increasing swelling in each of his legs.  Swelling affects both of his legs equally and is accompanied with pain in each of his calfs, where he states it feels like "my legs are going to burst."  He states he has never had this problem before, denies any history of CHF or DVT.  He denies any associated chest pain or shortness of breath, but has noticed that his abdomen has been more swollen as well.  He does regularly drink alcohol, admits to consuming 2 beers nightly but denies any history of liver problems.  He has not had any abdominal pain and denies any fevers.        History reviewed. No pertinent past medical history.  Patient Active Problem List   Diagnosis Date Noted  . Hyponatremia 05/24/2019  . Pneumonia due to COVID-19 virus 05/24/2019  . Transaminitis 05/24/2019  . Alcohol dependence with uncomplicated withdrawal ([place]) 05/24/2019  . Hypokalemia 05/24/2019  . COVID-19 05/24/2019    History reviewed. No pertinent surgical history.  Prior to Admission medications   Medication Sig Start Date End Date Taking? Authorizing Provider  furosemide (LASIX) 20 MG tablet Take 1 tablet (20 mg total) by mouth 2 (two) times daily for 5 days. 04/12/21 04/17/21 Yes Blake Divine, MD  blood glucose meter kit and supplies KIT Dispense based on patient and insurance preference.  Check CBGs before each meal and before bedtime. Log in your results and show to your PCP in 1 week 05/30/19   Caren Griffins, MD  dexamethasone (DECADRON) 6 MG tablet Take 1 tablet (6 mg total) by mouth daily. 05/31/19   Caren Griffins, MD  insulin glargine (LANTUS) 100 unit/mL SOPN Inject 0.1 mLs (10 Units total) into the skin daily. Please dispense 100 appropriate needles as well. 05/30/19   Caren Griffins, MD  metFORMIN (GLUCOPHAGE) 500 MG tablet Take 1 tablet (500 mg total) by mouth 2 (two) times daily with a meal. 05/30/19 05/29/20  Caren Griffins, MD    Allergies Patient has no known allergies.  Family History  Family history unknown: Yes    Social History Social History   Tobacco Use  . Smoking status: Never Smoker  . Smokeless tobacco: Never Used  Vaping Use  . Vaping Use: Never used  Substance Use Topics  . Alcohol use: Yes  . Drug use: Never    Review of Systems  Constitutional: No fever/chills Eyes: No visual changes. ENT: No sore throat. Cardiovascular: Denies chest pain. Respiratory: Denies shortness of breath. Gastrointestinal: No abdominal pain.  No nausea, no vomiting.  No diarrhea.  No constipation.  Positive for abdominal distention. Genitourinary: Negative for dysuria. Musculoskeletal: Negative for back pain.  Positive for leg swelling. Skin: Negative for rash. Neurological: Negative for headaches, focal weakness or numbness.  ____________________________________________   PHYSICAL EXAM:  VITAL SIGNS: ED Triage Vitals  Enc Vitals Group  BP 04/12/21 1200 (!) 141/90     Pulse Rate 04/12/21 1200 74     Resp 04/12/21 1200 16     Temp 04/12/21 1200 97.9 F (36.6 C)     Temp src --      SpO2 04/12/21 1200 96 %     Weight 04/12/21 1158 250 lb (113.4 kg)     Height 04/12/21 1158 '5\' 5"'  (1.651 m)     Head Circumference --      Peak Flow --      Pain Score 04/12/21 1158 10     Pain Loc --      Pain Edu? --      Excl. in [place]? --      Constitutional: Alert and oriented. Eyes: Conjunctivae are normal. Head: Atraumatic. Nose: No congestion/rhinnorhea. Mouth/Throat: Mucous membranes are moist. Neck: Normal ROM Cardiovascular: Normal rate, regular rhythm. Grossly normal heart sounds.  2+ DP pulses bilaterally. Respiratory: Normal respiratory effort.  No retractions. Lungs CTAB. Gastrointestinal: Soft and nontender.  Abdominal distention noted. Genitourinary: deferred Musculoskeletal: 2+ pitting edema just proximal to thighs bilaterally with associated calf tenderness bilaterally.  No overlying erythema or warmth. Neurologic:  Normal speech and language. No gross focal neurologic deficits are appreciated. Skin:  Skin is warm, dry and intact. No rash noted. Psychiatric: Mood and affect are normal. Speech and behavior are normal.  ____________________________________________   LABS (all labs ordered are listed, but only abnormal results are displayed)  Labs Reviewed  CBC WITH DIFFERENTIAL/PLATELET - Abnormal; Notable for the following components:      Result Value   RBC 3.25 (*)    Hemoglobin 11.0 (*)    HCT 31.7 (*)    All other components within normal limits  BASIC METABOLIC PANEL - Abnormal; Notable for the following components:   CO2 21 (*)    Glucose, Bld 111 (*)    Calcium 7.5 (*)    All other components within normal limits  HEPATIC FUNCTION PANEL - Abnormal; Notable for the following components:   Albumin 2.0 (*)    AST 65 (*)    Total Bilirubin 2.6 (*)    Bilirubin, Direct 0.9 (*)    Indirect Bilirubin 1.7 (*)    All other components within normal limits  BRAIN NATRIURETIC PEPTIDE   ____________________________________________  EKG  ED ECG REPORT I, Blake Divine, the attending physician, personally viewed and interpreted this ECG.   Date: 04/12/2021  EKG Time: 12:08  Rate: 79  Rhythm: normal sinus rhythm  Axis: LAD  Intervals:none  ST&T Change: None   PROCEDURES  Procedure(s)  performed (including Critical Care):  Procedures   ____________________________________________   INITIAL IMPRESSION / ASSESSMENT AND PLAN / ED COURSE       55 year old male with past medical history of diabetes and alcohol abuse who presents to the ED with increasing swelling in his legs over the past 3 days associated with abdominal distention.  Patient appears grossly fluid overloaded but denies any shortness of breath and BNP is within normal limits.  We will further assess for signs of CHF with chest x-ray, EKG shows no evidence of arrhythmia or ischemia.  Labs thus far are unremarkable, renal function and electrolytes reassuring.  Given his regular alcohol consumption with abdominal distention, we will add on LFTs.  He has no abdominal tenderness or fevers to suggest SBP.  We will also check lower extremity ultrasound for DVT, patient neurovascular intact to bilateral lower extremities.  Ultrasound is negative for DVT, LFTs consistent  with mild liver disease.  Chest x-ray reviewed by me and does show mild cardiomegaly with mild pulmonary edema, no focal infiltrates.  Patient has diuresed some following dose of Lasix, he was ambulated on a pulse ox without any difficulty breathing, maintain O2 sat at 98%.  He is appropriate for discharge home with follow-up with the CHF clinic and GI, also counseled to establish care with PCP.  Will prescribe short course of Lasix, patient counseled to return to the ED for any new or worsening symptoms.  Patient agrees with plan.      ____________________________________________   FINAL CLINICAL IMPRESSION(S) / ED DIAGNOSES  Final diagnoses:  Peripheral edema     ED Discharge Orders         Ordered    AMB referral to CHF clinic        04/12/21 1737    furosemide (LASIX) 20 MG tablet  2 times daily        04/12/21 1738           Note:  This document was prepared using Dragon voice recognition software and may include unintentional  dictation errors.   Blake Divine, MD 04/12/21 [phone]

## 2021-04-12 NOTE — ED Notes (Signed)
ED Larinda Buttery and spanish interpreter at bedside .  Pt ambulated in the hallways with a constant oxygen saturation of 98-99% on room air

## 2021-04-12 NOTE — ED Notes (Signed)
Patient transported to Ultrasound 

## 2021-04-28 ENCOUNTER — Emergency Department
Admission: EM | Admit: 2021-04-28 | Discharge: 2021-04-29 | Disposition: A | Discharge status: Home / Self Care | Payer: Self-pay | Attending: Emergency Medicine | Admitting: Emergency Medicine

## 2021-04-28 ENCOUNTER — Other Ambulatory Visit: Payer: Self-pay

## 2021-04-28 ENCOUNTER — Emergency Department: Payer: Self-pay

## 2021-04-28 DIAGNOSIS — R140 Abdominal distension (gaseous): Secondary | ICD-10-CM | POA: Insufficient documentation

## 2021-04-28 DIAGNOSIS — K7031 Alcoholic cirrhosis of liver with ascites: Secondary | ICD-10-CM

## 2021-04-28 DIAGNOSIS — Z8616 Personal history of COVID-19: Secondary | ICD-10-CM | POA: Insufficient documentation

## 2021-04-28 LAB — URINALYSIS, COMPLETE (UACMP) WITH MICROSCOPIC
Bacteria, UA: NONE SEEN
Bilirubin Urine: NEGATIVE
Glucose, UA: NEGATIVE mg/dL
Ketones, ur: NEGATIVE mg/dL
Leukocytes,Ua: NEGATIVE
Nitrite: NEGATIVE
Protein, ur: 30 mg/dL — AB
Specific Gravity, Urine: 1.012 (ref 1.005–1.030)
pH: 6 (ref 5.0–8.0)

## 2021-04-28 LAB — COMPREHENSIVE METABOLIC PANEL
ALT: 21 U/L (ref 0–44)
AST: 53 U/L — ABNORMAL HIGH (ref 15–41)
Albumin: 2 g/dL — ABNORMAL LOW (ref 3.5–5.0)
Alkaline Phosphatase: 93 U/L (ref 38–126)
Anion gap: 7 (ref 5–15)
BUN: 13 mg/dL (ref 6–20)
CO2: 25 mmol/L (ref 22–32)
Calcium: 7.8 mg/dL — ABNORMAL LOW (ref 8.9–10.3)
Chloride: 103 mmol/L (ref 98–111)
Creatinine, Ser: 0.9 mg/dL (ref 0.61–1.24)
GFR, Estimated: >60 mL/min (ref >60)
Glucose, Bld: 100 mg/dL — ABNORMAL HIGH (ref 70–99)
Potassium: 3.5 mmol/L (ref 3.5–5.1)
Sodium: 135 mmol/L (ref 135–145)
Total Bilirubin: 3.4 mg/dL — ABNORMAL HIGH (ref 0.3–1.2)
Total Protein: 7.6 g/dL (ref 6.5–8.1)

## 2021-04-28 LAB — CBC
HCT: 32.7 % — ABNORMAL LOW (ref 39.0–52.0)
Hemoglobin: 11.6 g/dL — ABNORMAL LOW (ref 13.0–17.0)
MCH: 33.6 pg (ref 26.0–34.0)
MCHC: 35.5 g/dL (ref 30.0–36.0)
MCV: 94.8 fL (ref 80.0–100.0)
Platelets: 128 10*3/uL — ABNORMAL LOW (ref 150–400)
RBC: 3.45 MIL/uL — ABNORMAL LOW (ref 4.22–5.81)
RDW: 13.3 % (ref 11.5–15.5)
WBC: 7.1 10*3/uL (ref 4.0–10.5)
nRBC: 0 % (ref 0.0–0.2)

## 2021-04-28 LAB — LIPASE, BLOOD: Lipase: 30 U/L (ref 11–51)

## 2021-04-28 NOTE — ED Notes (Addendum)
ED Provider at bedside. Video interpreter used.

## 2021-04-28 NOTE — ED Provider Notes (Signed)
[facility] Emergency Department Provider Note   ____________________________________________   I have reviewed the triage vital signs and the nursing notes.   HISTORY  Chief Complaint Abdominal Pain   History limited by: [facility] Interpreter utilized   HPI Shawn Torres is a 55 y.o. male who presents to the emergency department today because of concerns for abdominal swelling.  Patient states that the swelling started roughly 2 weeks ago.  He was seen in the ED around the time that the abdominal swelling began.  He was also having lower extremity swelling at that time.  He states that he has been taking the medication that was prescribed to him and while that is helped with the lower extremity edema and has not helped with the abdominal swelling.  He states that the swelling is significant enough now that he feels like he cannot eat or drink anything without increasing pain. Patient does state that he has history of heavy alcohol use.  Records reviewed. Per medical record review patient has a history of recent ER visit because of concern for bilateral lower extremity edema.   History reviewed. No pertinent past medical history.  Patient Active Problem List   Diagnosis Date Noted  . Hyponatremia 05/24/2019  . Pneumonia due to COVID-19 virus 05/24/2019  . Transaminitis 05/24/2019  . Alcohol dependence with uncomplicated withdrawal ([place]) 05/24/2019  . Hypokalemia 05/24/2019  . COVID-19 05/24/2019    History reviewed. No pertinent surgical history.  Prior to Admission medications   Medication Sig Start Date End Date Taking? Authorizing Provider  blood glucose meter kit and supplies KIT Dispense based on patient and insurance preference. Check CBGs before each meal and before bedtime. Log in your results and show to your PCP in 1 week 05/30/19   Caren Griffins, MD  dexamethasone (DECADRON) 6 MG tablet Take 1 tablet (6 mg total) by mouth  daily. 05/31/19   Caren Griffins, MD  furosemide (LASIX) 20 MG tablet Take 1 tablet (20 mg total) by mouth 2 (two) times daily for 5 days. 04/12/21 04/17/21  Blake Divine, MD  insulin glargine (LANTUS) 100 unit/mL SOPN Inject 0.1 mLs (10 Units total) into the skin daily. Please dispense 100 appropriate needles as well. 05/30/19   Caren Griffins, MD  metFORMIN (GLUCOPHAGE) 500 MG tablet Take 1 tablet (500 mg total) by mouth 2 (two) times daily with a meal. 05/30/19 05/29/20  Caren Griffins, MD    Allergies Patient has no known allergies.  Family History  Family history unknown: Yes    Social History Social History   Tobacco Use  . Smoking status: Never Smoker  . Smokeless tobacco: Never Used  Vaping Use  . Vaping Use: Never used  Substance Use Topics  . Alcohol use: Yes  . Drug use: Never    Review of Systems Constitutional: No fever/chills Eyes: No visual changes. ENT: No sore throat. Cardiovascular: Denies chest pain. Respiratory: Denies shortness of breath. Gastrointestinal: Positive for abdominal distention.  Genitourinary: Negative for dysuria. Musculoskeletal: Positive for improved lower extremity edema.  Skin: Negative for rash. Neurological: Negative for headaches, focal weakness or numbness.  ____________________________________________   PHYSICAL EXAM:  VITAL SIGNS: ED Triage Vitals  Enc Vitals Group     BP 04/28/21 1733 137/84     Pulse Rate 04/28/21 1733 83     Resp 04/28/21 1733 18     Temp 04/28/21 1733 99 F (37.2 C)     Temp Source 04/28/21 1733  Oral     SpO2 04/28/21 1733 97 %     Weight 04/28/21 1734 200 lb (90.7 kg)     Height 04/28/21 1734 '5\' 5"'  (1.651 m)     Head Circumference --      Peak Flow --      Pain Score 04/28/21 1733 8    Constitutional: Alert and oriented.  Eyes: Conjunctivae are normal.  ENT      Head: Normocephalic and atraumatic.      Nose: No congestion/rhinnorhea.      Mouth/Throat: Mucous membranes are moist.       Neck: No stridor. Hematological/Lymphatic/Immunilogical: No cervical lymphadenopathy. Cardiovascular: Normal rate, regular rhythm.  No murmurs, rubs, or gallops.  Respiratory: Normal respiratory effort without tachypnea nor retractions. Breath sounds are clear and equal bilaterally. No wheezes/rales/rhonchi. Gastrointestinal: Soft. Markedly distended. Tympanitic. No significant tenderness to palpation.  Genitourinary: Deferred Musculoskeletal: Normal range of motion in all extremities. Trace lower extremity edema.  Neurologic:  Normal speech and language. No gross focal neurologic deficits are appreciated.  Skin:  Skin is warm, dry and intact. No rash noted. Psychiatric: Mood and affect are normal. Speech and behavior are normal. Patient exhibits appropriate insight and judgment.  ____________________________________________    LABS (pertinent positives/negatives)  CBC wbc 7.1, hgb 11.6, plt 128 CMP wnl except glu 100, ca 7.8, alb 2.0, ast 53, t bili 3.4 Lipase 30 UA clear, moderate hgb dipstick, protein 30, rbc 11-20. Wbc 6-10 ____________________________________________   EKG  None  ____________________________________________    RADIOLOGY  CT ab/pel pending  ____________________________________________   PROCEDURES  Procedures  ____________________________________________   INITIAL IMPRESSION / ASSESSMENT AND PLAN / ED COURSE  Pertinent labs & imaging results that were available during my care of the patient were reviewed by me and considered in my medical decision making (see chart for details).   Patient presented to the emergency department today because of concerns for abdominal swelling.  On exam patient does have marked abdominal distention.  I do have concerns the patient could have developed cirrhosis and now ascites given alcohol use.  Patient's abdomen was tympanitic.  Given that he also states he has been having a hard time with p.o. do wonder if  patient could have SBO although he denies any abdominal surgeries.  Will obtain a CT scan to further evaluate. Do not have suspicion for SBP at this time.    ____________________________________________   FINAL CLINICAL IMPRESSION(S) / ED DIAGNOSES  Final diagnoses:  Abdominal distention     Note: This dictation was prepared with Dragon dictation. Any transcriptional errors that result from this process are unintentional     Nance Pear, MD 04/28/21 2328

## 2021-04-28 NOTE — ED Notes (Signed)
Patient transported to CT 

## 2021-04-28 NOTE — ED Triage Notes (Signed)
Pt sent from [facility] today for abd pain, swelling. States was in ER for abd swelling and leg swelling. States leg swelling has resolved but abd is distended more. A&O, ambulatory. Spanish speaking.

## 2021-04-28 NOTE — ED Notes (Addendum)
Pt presents via triage for c/o abdominal distention since May 23. Pt reports he was seen in [facility] for leg swelling and was prescribed fluid pills. Pt sts his abdominal swelling has not improved. Denies fever, SOB, or abdominal pain. Pt reports hx of drinking 3 beers per day and quit drinking alcohol 2 weeks ago.

## 2021-04-28 NOTE — Progress Notes (Deleted)
   Patient ID: Shawn Torres, male    DOB: 02/08/1966, 55 y.o.   MRN: [id number]  HPI  Mr Shawn Torres is a 54 y/o male with a history of  No echo to report.  Was in the ED 04/12/21 due to peripheral edema. Ultrasound negative for DVT. Given lasix with improvement of his symptoms and he was released.   He presents today for his initial visit with a chief complaint of   Review of Systems    Physical Exam    Assessment & Plan:  1: Chronic heart failure with preserved ejection fraction- - NYHA class - BNP 04/12/21 was 80.8  2: DM- - A1c 05/24/2019 was 10.8  3: Alcohol use- - BMP 04/12/21 reviewed and showed sodium 136, potassium 3.8, creatinine 0.66 and GFR >60

## 2021-04-29 ENCOUNTER — Telehealth: Payer: Self-pay | Admitting: Family

## 2021-04-29 ENCOUNTER — Ambulatory Visit: Payer: Self-pay | Admitting: Family

## 2021-04-29 MED ORDER — FUROSEMIDE 20 MG PO TABS: 20.0000 mg | ORAL_TABLET | Freq: Two times a day (BID) | ORAL | 0 refills | Status: DC

## 2021-04-29 MED ORDER — FUROSEMIDE 20 MG PO TABS: 20.0000 mg | ORAL_TABLET | Freq: Every day | ORAL | 1 refills | Status: DC

## 2021-04-29 NOTE — Telephone Encounter (Signed)
Patient did not show for his [facility] appointment on 04/29/21. Will attempt to reschedule.

## 2021-04-29 NOTE — Discharge Instructions (Addendum)
Follow-up with the liver specialist as soon as possible.  Return to the emergency room for worsening abdominal pain, fever, chest pain or shortness of breath.

## 2021-04-29 NOTE — ED Provider Notes (Signed)
CT consistent with cirrhosis of the liver.  Patient referred to liver clinic for outpatient management.  No indications for admission at this time.  Discussed and return precautions with patient and his son-in-law who is at bedside    I have personally reviewed the images performed during this visit and I agree with the Radiologist's read.   Interpretation by Radiologist:  CT ABDOMEN PELVIS WO CONTRAST  Result Date: 04/28/2021 CLINICAL DATA:  Abdominal pain and swelling. EXAM: CT ABDOMEN AND PELVIS WITHOUT CONTRAST TECHNIQUE: Multidetector CT imaging of the abdomen and pelvis was performed following the standard protocol without IV contrast. COMPARISON:  None. FINDINGS: Lower chest: Borderline cardiomegaly. Small left pleural effusion. Streaky atelectasis within the lower lobes. No confluent consolidation. Hepatobiliary: Nodular hepatic contours consistent with cirrhosis. There is no evidence of focal liver lesion on this noncontrast exam. Gallstones within partially distended gallbladder. Common bile duct is not well-defined, but no significant biliary dilatation. Pancreas: No ductal dilatation or inflammation. Spleen: No splenomegaly. Greatest splenic dimension is 11.6 cm. No focal abnormality. Adrenals/Urinary Tract: Normal adrenal glands. No hydronephrosis or renal calculi. No perinephric edema. Unremarkable urinary bladder. Stomach/Bowel: Detailed bowel assessment limited in the absence of contrast and presence of abdominopelvic ascites. 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 Unremarkable stomach. There is no bowel dilatation to suggest obstruction. Normal air-filled appendix courses in the right pericolic gutter. No bowel wall thickening about the hepatic flexure of the colon, suggestive of portal colopathy. Vascular/Lymphatic: Mild aortic atherosclerosis. No aortic aneurysm. No portal venous or mesenteric gas. No bulky abdominopelvic adenopathy. Reproductive: Prostate is unremarkable. Other: Large volume  abdominopelvic ascites. No fluid complexity. Moderate volume mesenteric ascites. Stranding of the mesenteric and omental fat. No free air. There is edema in the subcutaneous tissues adjacent to the umbilicus. Mild lower abdominal subcutaneous edema. Fat in both inguinal canals. Musculoskeletal: There are no acute or suspicious osseous abnormalities. Bone island in the left femoral head. IMPRESSION: 1. Hepatic cirrhosis with large volume abdominopelvic ascites. 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 2. Cholelithiasis. 3. Small left pleural effusion. Aortic Atherosclerosis (ICD10-I70.0). Electronically Signed   By: Narda Rutherford M.D.   On: 04/28/2021 23:39      Nita Sickle, MD 04/29/21 [phone]

## 2021-04-30 DIAGNOSIS — E119 Type 2 diabetes mellitus without complications: Secondary | ICD-10-CM

## 2021-04-30 DIAGNOSIS — D696 Thrombocytopenia, unspecified: Secondary | ICD-10-CM | POA: Diagnosis present

## 2021-10-10 ENCOUNTER — Inpatient Hospital Stay: Payer: Medicaid Other | Admitting: Anesthesiology

## 2021-10-10 ENCOUNTER — Inpatient Hospital Stay: Payer: Medicaid Other

## 2021-10-10 ENCOUNTER — Inpatient Hospital Stay
Admission: EM | Admit: 2021-10-10 | Discharge: 2021-10-16 | DRG: 432 | Disposition: A | Discharge status: Home / Self Care | Payer: Medicaid Other | Attending: Student in an Organized Health Care Education/Training Program | Admitting: Student in an Organized Health Care Education/Training Program

## 2021-10-10 ENCOUNTER — Encounter
Admission: EM | Disposition: A | Discharge status: Home / Self Care | Payer: Self-pay | Source: Home / Self Care | Attending: Pulmonary Disease

## 2021-10-10 ENCOUNTER — Other Ambulatory Visit: Payer: Self-pay

## 2021-10-10 DIAGNOSIS — Z6828 Body mass index (BMI) 28.0-28.9, adult: Secondary | ICD-10-CM | POA: Diagnosis not present

## 2021-10-10 DIAGNOSIS — Z9114 Patient's other noncompliance with medication regimen: Secondary | ICD-10-CM | POA: Diagnosis not present

## 2021-10-10 DIAGNOSIS — Z7984 Long term (current) use of oral hypoglycemic drugs: Secondary | ICD-10-CM

## 2021-10-10 DIAGNOSIS — K769 Liver disease, unspecified: Secondary | ICD-10-CM | POA: Diagnosis present

## 2021-10-10 DIAGNOSIS — F10239 Alcohol dependence with withdrawal, unspecified: Secondary | ICD-10-CM | POA: Diagnosis present

## 2021-10-10 DIAGNOSIS — E872 Acidosis, unspecified: Secondary | ICD-10-CM | POA: Diagnosis present

## 2021-10-10 DIAGNOSIS — D696 Thrombocytopenia, unspecified: Secondary | ICD-10-CM | POA: Diagnosis present

## 2021-10-10 DIAGNOSIS — Z01818 Encounter for other preprocedural examination: Secondary | ICD-10-CM

## 2021-10-10 DIAGNOSIS — I8511 Secondary esophageal varices with bleeding: Secondary | ICD-10-CM

## 2021-10-10 DIAGNOSIS — K7682 Hepatic encephalopathy: Secondary | ICD-10-CM | POA: Diagnosis present

## 2021-10-10 DIAGNOSIS — R7401 Elevation of levels of liver transaminase levels: Secondary | ICD-10-CM

## 2021-10-10 DIAGNOSIS — N179 Acute kidney failure, unspecified: Secondary | ICD-10-CM | POA: Diagnosis present

## 2021-10-10 DIAGNOSIS — D62 Acute posthemorrhagic anemia: Secondary | ICD-10-CM | POA: Diagnosis present

## 2021-10-10 DIAGNOSIS — R338 Other retention of urine: Secondary | ICD-10-CM

## 2021-10-10 DIAGNOSIS — Z79899 Other long term (current) drug therapy: Secondary | ICD-10-CM | POA: Diagnosis not present

## 2021-10-10 DIAGNOSIS — K766 Portal hypertension: Secondary | ICD-10-CM | POA: Diagnosis present

## 2021-10-10 DIAGNOSIS — Z8616 Personal history of COVID-19: Secondary | ICD-10-CM

## 2021-10-10 DIAGNOSIS — R339 Retention of urine, unspecified: Secondary | ICD-10-CM | POA: Diagnosis not present

## 2021-10-10 DIAGNOSIS — E7220 Disorder of urea cycle metabolism, unspecified: Secondary | ICD-10-CM

## 2021-10-10 DIAGNOSIS — E119 Type 2 diabetes mellitus without complications: Secondary | ICD-10-CM | POA: Diagnosis present

## 2021-10-10 DIAGNOSIS — Z20822 Contact with and (suspected) exposure to covid-19: Secondary | ICD-10-CM | POA: Diagnosis present

## 2021-10-10 DIAGNOSIS — Z7952 Long term (current) use of systemic steroids: Secondary | ICD-10-CM | POA: Diagnosis not present

## 2021-10-10 DIAGNOSIS — F1021 Alcohol dependence, in remission: Secondary | ICD-10-CM

## 2021-10-10 DIAGNOSIS — I8510 Secondary esophageal varices without bleeding: Secondary | ICD-10-CM

## 2021-10-10 DIAGNOSIS — J9601 Acute respiratory failure with hypoxia: Secondary | ICD-10-CM

## 2021-10-10 DIAGNOSIS — K920 Hematemesis: Secondary | ICD-10-CM | POA: Diagnosis present

## 2021-10-10 DIAGNOSIS — K269 Duodenal ulcer, unspecified as acute or chronic, without hemorrhage or perforation: Secondary | ICD-10-CM | POA: Diagnosis present

## 2021-10-10 DIAGNOSIS — R4182 Altered mental status, unspecified: Secondary | ICD-10-CM | POA: Diagnosis present

## 2021-10-10 DIAGNOSIS — D684 Acquired coagulation factor deficiency: Secondary | ICD-10-CM | POA: Diagnosis present

## 2021-10-10 DIAGNOSIS — E440 Moderate protein-calorie malnutrition: Secondary | ICD-10-CM | POA: Diagnosis present

## 2021-10-10 DIAGNOSIS — K7031 Alcoholic cirrhosis of liver with ascites: Secondary | ICD-10-CM | POA: Diagnosis present

## 2021-10-10 DIAGNOSIS — Z452 Encounter for adjustment and management of vascular access device: Secondary | ICD-10-CM

## 2021-10-10 DIAGNOSIS — R140 Abdominal distension (gaseous): Secondary | ICD-10-CM

## 2021-10-10 HISTORY — PX: ESOPHAGOGASTRODUODENOSCOPY: SHX5428

## 2021-10-10 LAB — GLUCOSE, CAPILLARY
Glucose-Capillary: 115 mg/dL — ABNORMAL HIGH (ref 70–99)
Glucose-Capillary: 83 mg/dL (ref 70–99)
Glucose-Capillary: 87 mg/dL (ref 70–99)

## 2021-10-10 LAB — TYPE AND SCREEN
ABO/RH(D): O POS
Antibody Screen: NEGATIVE

## 2021-10-10 LAB — COMPREHENSIVE METABOLIC PANEL
ALT: 29 U/L (ref 0–44)
AST: 55 U/L — ABNORMAL HIGH (ref 15–41)
Albumin: 2.9 g/dL — ABNORMAL LOW (ref 3.5–5.0)
Alkaline Phosphatase: 119 U/L (ref 38–126)
Anion gap: 10 (ref 5–15)
BUN: 38 mg/dL — ABNORMAL HIGH (ref 6–20)
CO2: 17 mmol/L — ABNORMAL LOW (ref 22–32)
Calcium: 8.6 mg/dL — ABNORMAL LOW (ref 8.9–10.3)
Chloride: 106 mmol/L (ref 98–111)
Creatinine, Ser: 1.68 mg/dL — ABNORMAL HIGH (ref 0.61–1.24)
GFR, Estimated: 48 mL/min — ABNORMAL LOW (ref >60)
Glucose, Bld: 170 mg/dL — ABNORMAL HIGH (ref 70–99)
Potassium: 4.1 mmol/L (ref 3.5–5.1)
Sodium: 133 mmol/L — ABNORMAL LOW (ref 135–145)
Total Bilirubin: 3.6 mg/dL — ABNORMAL HIGH (ref 0.3–1.2)
Total Protein: 8.2 g/dL — ABNORMAL HIGH (ref 6.5–8.1)

## 2021-10-10 LAB — CBC
HCT: 32.4 % — ABNORMAL LOW (ref 39.0–52.0)
HCT: 27.9 % — ABNORMAL LOW (ref 39.0–52.0)
HCT: 25.2 % — ABNORMAL LOW (ref 39.0–52.0)
HCT: 24.8 % — ABNORMAL LOW (ref 39.0–52.0)
Hemoglobin: 11.5 g/dL — ABNORMAL LOW (ref 13.0–17.0)
Hemoglobin: 10.3 g/dL — ABNORMAL LOW (ref 13.0–17.0)
Hemoglobin: 9 g/dL — ABNORMAL LOW (ref 13.0–17.0)
Hemoglobin: 8.8 g/dL — ABNORMAL LOW (ref 13.0–17.0)
MCH: 36.6 pg — ABNORMAL HIGH (ref 26.0–34.0)
MCH: 37.3 pg — ABNORMAL HIGH (ref 26.0–34.0)
MCH: 36 pg — ABNORMAL HIGH (ref 26.0–34.0)
MCH: 35.6 pg — ABNORMAL HIGH (ref 26.0–34.0)
MCHC: 35.5 g/dL (ref 30.0–36.0)
MCHC: 36.9 g/dL — ABNORMAL HIGH (ref 30.0–36.0)
MCHC: 35.7 g/dL (ref 30.0–36.0)
MCHC: 35.5 g/dL (ref 30.0–36.0)
MCV: 103.2 fL — ABNORMAL HIGH (ref 80.0–100.0)
MCV: 101.1 fL — ABNORMAL HIGH (ref 80.0–100.0)
MCV: 100.8 fL — ABNORMAL HIGH (ref 80.0–100.0)
MCV: 100.4 fL — ABNORMAL HIGH (ref 80.0–100.0)
Platelets: 165 10*3/uL (ref 150–400)
Platelets: 135 10*3/uL — ABNORMAL LOW (ref 150–400)
Platelets: 109 10*3/uL — ABNORMAL LOW (ref 150–400)
Platelets: 106 10*3/uL — ABNORMAL LOW (ref 150–400)
RBC: 3.14 MIL/uL — ABNORMAL LOW (ref 4.22–5.81)
RBC: 2.76 MIL/uL — ABNORMAL LOW (ref 4.22–5.81)
RBC: 2.5 MIL/uL — ABNORMAL LOW (ref 4.22–5.81)
RBC: 2.47 MIL/uL — ABNORMAL LOW (ref 4.22–5.81)
RDW: 14.1 % (ref 11.5–15.5)
RDW: 13.9 % (ref 11.5–15.5)
RDW: 13.8 % (ref 11.5–15.5)
RDW: 14.2 % (ref 11.5–15.5)
WBC: 7.1 10*3/uL (ref 4.0–10.5)
WBC: 7.3 10*3/uL (ref 4.0–10.5)
WBC: 6 10*3/uL (ref 4.0–10.5)
WBC: 6.6 10*3/uL (ref 4.0–10.5)
nRBC: 0 % (ref 0.0–0.2)
nRBC: 0 % (ref 0.0–0.2)
nRBC: 0 % (ref 0.0–0.2)
nRBC: 0 % (ref 0.0–0.2)

## 2021-10-10 LAB — BLOOD GAS, ARTERIAL
Acid-base deficit: 7.4 mmol/L — ABNORMAL HIGH (ref 0.0–2.0)
Bicarbonate: 16.8 mmol/L — ABNORMAL LOW (ref 20.0–28.0)
FIO2: 0.3
MECHVT: 450 mL
O2 Saturation: 98.1 %
PEEP: 5 cmH2O
Patient temperature: 37
RATE: 16 resp/min
pCO2 arterial: 29 mmHg — ABNORMAL LOW (ref 32.0–48.0)
pH, Arterial: 7.37 (ref 7.350–7.450)
pO2, Arterial: 110 mmHg — ABNORMAL HIGH (ref 83.0–108.0)

## 2021-10-10 LAB — MAGNESIUM: Magnesium: 2 mg/dL (ref 1.7–2.4)

## 2021-10-10 LAB — AMMONIA: Ammonia: 230 umol/L — ABNORMAL HIGH (ref 9–35)

## 2021-10-10 LAB — PHOSPHORUS: Phosphorus: 4.1 mg/dL (ref 2.5–4.6)

## 2021-10-10 LAB — RESP PANEL BY RT-PCR (FLU A&B, COVID) ARPGX2
Influenza A by PCR: NEGATIVE
Influenza B by PCR: NEGATIVE
SARS Coronavirus 2 by RT PCR: NEGATIVE

## 2021-10-10 LAB — LACTIC ACID, PLASMA
Lactic Acid, Venous: 4.7 mmol/L (ref 0.5–1.9)
Lactic Acid, Venous: 1.5 mmol/L (ref 0.5–1.9)
Lactic Acid, Venous: 2.1 mmol/L (ref 0.5–1.9)
Lactic Acid, Venous: 1.7 mmol/L (ref 0.5–1.9)

## 2021-10-10 LAB — PROTIME-INR
INR: 2 — ABNORMAL HIGH (ref 0.8–1.2)
Prothrombin Time: 22.2 seconds — ABNORMAL HIGH (ref 11.4–15.2)

## 2021-10-10 LAB — MRSA NEXT GEN BY PCR, NASAL: MRSA by PCR Next Gen: NOT DETECTED

## 2021-10-10 LAB — ETHANOL: Alcohol, Ethyl (B): <10 mg/dL (ref <10)

## 2021-10-10 SURGERY — EGD (ESOPHAGOGASTRODUODENOSCOPY)
Anesthesia: General

## 2021-10-10 MED ORDER — PANTOPRAZOLE INFUSION (NEW) - SIMPLE MED
8.0000 mg/h | INTRAVENOUS | Status: AC
  Administered 2021-10-10 – 2021-10-13 (×8): 8 mg/h via INTRAVENOUS
  Filled 2021-10-10: qty 100
  Filled 2021-10-10: qty 80
  Filled 2021-10-10 (×2): qty 100
  Filled 2021-10-10: qty 80
  Filled 2021-10-10 (×3): qty 100

## 2021-10-10 MED ORDER — ROCURONIUM BROMIDE 100 MG/10ML IV SOLN
INTRAVENOUS | Status: DC | PRN
  Administered 2021-10-10: 50 mg via INTRAVENOUS

## 2021-10-10 MED ORDER — MIDAZOLAM HCL 2 MG/2ML IJ SOLN
INTRAMUSCULAR | Status: AC
  Filled 2021-10-10: qty 2

## 2021-10-10 MED ORDER — PROPOFOL 10 MG/ML IV BOLUS
INTRAVENOUS | Status: AC
  Filled 2021-10-10: qty 20

## 2021-10-10 MED ORDER — PROPOFOL 1000 MG/100ML IV EMUL
0.0000 ug/kg/min | INTRAVENOUS | Status: DC
  Administered 2021-10-10: 35 ug/kg/min via INTRAVENOUS
  Administered 2021-10-10: 5 ug/kg/min via INTRAVENOUS
  Administered 2021-10-11 – 2021-10-12 (×4): 25 ug/kg/min via INTRAVENOUS
  Filled 2021-10-10 (×5): qty 100

## 2021-10-10 MED ORDER — ORAL CARE MOUTH RINSE
15.0000 mL | OROMUCOSAL | Status: DC
  Administered 2021-10-10 – 2021-10-14 (×38): 15 mL via OROMUCOSAL

## 2021-10-10 MED ORDER — FENTANYL CITRATE PF 50 MCG/ML IJ SOSY
50.0000 ug | PREFILLED_SYRINGE | INTRAMUSCULAR | Status: AC | PRN
  Administered 2021-10-10 (×3): 50 ug via INTRAVENOUS
  Filled 2021-10-10 (×3): qty 1

## 2021-10-10 MED ORDER — LIDOCAINE HCL (CARDIAC) PF 100 MG/5ML IV SOSY
PREFILLED_SYRINGE | INTRAVENOUS | Status: DC | PRN
  Administered 2021-10-10: 100 mg via INTRAVENOUS

## 2021-10-10 MED ORDER — SUCCINYLCHOLINE CHLORIDE 200 MG/10ML IV SOSY
PREFILLED_SYRINGE | INTRAVENOUS | Status: DC | PRN
  Administered 2021-10-10: 200 mg via INTRAVENOUS

## 2021-10-10 MED ORDER — LACTULOSE ENEMA
300.0000 mL | Freq: Four times a day (QID) | RECTAL | Status: DC
Start: 2021-10-10 — End: 2021-10-13
  Administered 2021-10-10 – 2021-10-13 (×7): 300 mL via RECTAL
  Filled 2021-10-10 (×16): qty 300

## 2021-10-10 MED ORDER — MIDAZOLAM HCL 2 MG/2ML IJ SOLN
INTRAMUSCULAR | Status: DC | PRN
  Administered 2021-10-10: 2 mg via INTRAVENOUS

## 2021-10-10 MED ORDER — PROPOFOL 500 MG/50ML IV EMUL
INTRAVENOUS | Status: AC
  Filled 2021-10-10: qty 50

## 2021-10-10 MED ORDER — PANTOPRAZOLE 80MG IVPB - SIMPLE MED
80.0000 mg | Freq: Once | INTRAVENOUS | Status: AC
  Administered 2021-10-10: 80 mg via INTRAVENOUS
  Filled 2021-10-10: qty 80

## 2021-10-10 MED ORDER — SODIUM CHLORIDE 0.9 % IV SOLN
1.0000 g | INTRAVENOUS | Status: DC
  Administered 2021-10-10 – 2021-10-12 (×3): 1 g via INTRAVENOUS
  Filled 2021-10-10: qty 10
  Filled 2021-10-10 (×2): qty 1
  Filled 2021-10-10: qty 10

## 2021-10-10 MED ORDER — FENTANYL CITRATE (PF) 100 MCG/2ML IJ SOLN
INTRAMUSCULAR | Status: DC | PRN
  Administered 2021-10-10: 100 ug via INTRAVENOUS

## 2021-10-10 MED ORDER — POLYETHYLENE GLYCOL 3350 17 G PO PACK
17.0000 g | PACK | Freq: Every day | ORAL | Status: DC | PRN
  Administered 2021-10-16: 08:00:00 17 g via ORAL
  Filled 2021-10-10: qty 1

## 2021-10-10 MED ORDER — LACTULOSE ENEMA
300.0000 mL | Freq: Once | ORAL | Status: DC
  Administered 2021-10-10: 300 mL via RECTAL
  Filled 2021-10-10: qty 300

## 2021-10-10 MED ORDER — NOREPINEPHRINE 4 MG/250ML-% IV SOLN
2.0000 ug/min | INTRAVENOUS | Status: DC
  Administered 2021-10-10: 10 ug/min via INTRAVENOUS

## 2021-10-10 MED ORDER — DOCUSATE SODIUM 100 MG PO CAPS: 100.0000 mg | ORAL_CAPSULE | Freq: Two times a day (BID) | ORAL | Status: DC | PRN

## 2021-10-10 MED ORDER — PROPOFOL 10 MG/ML IV BOLUS
INTRAVENOUS | Status: DC | PRN
  Administered 2021-10-10: 200 mg via INTRAVENOUS

## 2021-10-10 MED ORDER — SODIUM CHLORIDE 0.9 % IV SOLN: 2.0000 g | Freq: Once | INTRAVENOUS | Status: DC

## 2021-10-10 MED ORDER — LACTULOSE ENEMA
300.0000 mL | Freq: Four times a day (QID) | ORAL | Status: DC
  Filled 2021-10-10: qty 300

## 2021-10-10 MED ORDER — FENTANYL CITRATE (PF) 100 MCG/2ML IJ SOLN
INTRAMUSCULAR | Status: AC
  Filled 2021-10-10: qty 2

## 2021-10-10 MED ORDER — INSULIN ASPART 100 UNIT/ML IJ SOLN
0.0000 [IU] | INTRAMUSCULAR | Status: DC
  Administered 2021-10-13: 3 [IU] via SUBCUTANEOUS
  Administered 2021-10-13: 5 [IU] via SUBCUTANEOUS
  Administered 2021-10-13: 3 [IU] via SUBCUTANEOUS
  Administered 2021-10-14 (×3): 2 [IU] via SUBCUTANEOUS
  Administered 2021-10-14: 5 [IU] via SUBCUTANEOUS
  Administered 2021-10-15: 09:00:00 2 [IU] via SUBCUTANEOUS
  Administered 2021-10-15 (×3): 3 [IU] via SUBCUTANEOUS
  Administered 2021-10-16: 2 [IU] via SUBCUTANEOUS
  Filled 2021-10-10 (×14): qty 1

## 2021-10-10 MED ORDER — LACTULOSE ENEMA
300.0000 mL | Freq: Once | ORAL | Status: DC
  Filled 2021-10-10: qty 300

## 2021-10-10 MED ORDER — SODIUM CHLORIDE 0.9 % IV SOLN
1.0000 g | INTRAVENOUS | Status: DC
  Filled 2021-10-10: qty 10

## 2021-10-10 MED ORDER — OCTREOTIDE LOAD VIA INFUSION
50.0000 ug | Freq: Once | INTRAVENOUS | Status: AC
  Administered 2021-10-10: 50 ug via INTRAVENOUS
  Filled 2021-10-10: qty 25

## 2021-10-10 MED ORDER — NOREPINEPHRINE 4 MG/250ML-% IV SOLN
INTRAVENOUS | Status: AC
  Filled 2021-10-10: qty 250

## 2021-10-10 MED ORDER — PROPOFOL 1000 MG/100ML IV EMUL
INTRAVENOUS | Status: AC
  Filled 2021-10-10: qty 100

## 2021-10-10 MED ORDER — LACTATED RINGERS IV BOLUS
1000.0000 mL | Freq: Once | INTRAVENOUS | Status: AC
  Administered 2021-10-10: 1000 mL via INTRAVENOUS

## 2021-10-10 MED ORDER — ALBUMIN HUMAN 25 % IV SOLN
25.0000 g | Freq: Once | INTRAVENOUS | Status: AC
  Administered 2021-10-10: 25 g via INTRAVENOUS
  Filled 2021-10-10: qty 100

## 2021-10-10 MED ORDER — CHLORHEXIDINE GLUCONATE CLOTH 2 % EX PADS
6.0000 | MEDICATED_PAD | Freq: Every day | CUTANEOUS | Status: DC
  Administered 2021-10-10 – 2021-10-14 (×5): 6 via TOPICAL

## 2021-10-10 MED ORDER — SODIUM CHLORIDE 0.9 % IV SOLN
50.0000 ug/h | INTRAVENOUS | Status: DC
  Administered 2021-10-10 – 2021-10-16 (×12): 50 ug/h via INTRAVENOUS
  Filled 2021-10-10 (×24): qty 1

## 2021-10-10 MED ORDER — FENTANYL CITRATE PF 50 MCG/ML IJ SOSY
50.0000 ug | PREFILLED_SYRINGE | INTRAMUSCULAR | Status: DC | PRN
  Administered 2021-10-10 (×2): 50 ug via INTRAVENOUS
  Administered 2021-10-11: 100 ug via INTRAVENOUS
  Administered 2021-10-11 – 2021-10-12 (×2): 50 ug via INTRAVENOUS
  Filled 2021-10-10: qty 4
  Filled 2021-10-10 (×2): qty 1
  Filled 2021-10-10 (×2): qty 2
  Filled 2021-10-10: qty 1

## 2021-10-10 MED ORDER — LACTATED RINGERS IV BOLUS
1000.0000 mL | Freq: Once | INTRAVENOUS | Status: AC
Start: 2021-10-10 — End: 2021-10-10
  Administered 2021-10-10: 1000 mL via INTRAVENOUS

## 2021-10-10 MED ORDER — SODIUM CHLORIDE 0.9 % IV SOLN: INTRAVENOUS | Status: DC

## 2021-10-10 MED ORDER — CHLORHEXIDINE GLUCONATE 0.12% ORAL RINSE (MEDLINE KIT)
15.0000 mL | Freq: Two times a day (BID) | OROMUCOSAL | Status: DC
  Administered 2021-10-10 – 2021-10-13 (×6): 15 mL via OROMUCOSAL

## 2021-10-10 MED ORDER — ONDANSETRON HCL 4 MG/2ML IJ SOLN
4.0000 mg | Freq: Four times a day (QID) | INTRAMUSCULAR | Status: DC | PRN
  Administered 2021-10-14 – 2021-10-16 (×2): 4 mg via INTRAVENOUS
  Filled 2021-10-10 (×2): qty 2

## 2021-10-10 MED ORDER — SODIUM CHLORIDE 0.9 % IV SOLN
250.0000 mL | INTRAVENOUS | Status: DC
  Administered 2021-10-11: 250 mL via INTRAVENOUS

## 2021-10-10 NOTE — ED Triage Notes (Addendum)
Pt  comes into the ED via EMS from home with daughter, pt was started on lactulose and was doing well , pt has not had the lactulose since Friday and has had increased AMS, pt is not redirectable on arrival, pt keeps trying to get, pt requires an interpreter for spanish, Pt is vomiting bloody emesis during triage and initial assessment.

## 2021-10-10 NOTE — ED Notes (Signed)
Resting with eyes closed, sitter at bedside for patient safety.

## 2021-10-10 NOTE — Transfer of Care (Signed)
Immediate Anesthesia Transfer of Care Note  Patient: Shawn Torres  Procedure(s) Performed: ESOPHAGOGASTRODUODENOSCOPY (EGD)  Patient Location: ICU  Anesthesia Type:General  Level of Consciousness: Patient remains intubated per anesthesia plan  Airway & Oxygen Therapy: Patient remains intubated per anesthesia plan and Patient placed on Ventilator (see vital sign flow sheet for setting)  Post-op Assessment: Report given to RN  Post vital signs: stable  Last Vitals:  Vitals Value Taken Time  BP 150/91 10/10/21 1442  Temp    Pulse 73 10/10/21 1448  Resp 16 10/10/21 1448  SpO2 100 % 10/10/21 1448  Vitals shown include unvalidated device data.  Last Pain:  Vitals:   10/10/21 1318  TempSrc:   PainSc: Asleep         Complications: No notable events documented.

## 2021-10-10 NOTE — Anesthesia Preprocedure Evaluation (Addendum)
Anesthesia Evaluation  Patient identified by MRN, date of birth, ID band Patient confused  General Assessment Comment:Spanish speaking   Reviewed: Patient's Chart, lab work & pertinent test results  Airway       Comment: Unable to adequately assess due to AMS. Dental   Pulmonary neg pulmonary ROS,           Cardiovascular negative cardio ROS   Rhythm:Regular Rate:Normal     Neuro/Psych PSYCHIATRIC DISORDERS (ETOH dependence ) Altered mental status with elevated Ammonia level.  Lactulose enema administered in ED.     GI/Hepatic (+) Cirrhosis   Esophageal Varices    , Recent hematemesis.  Compensated metabolic acidosis.    Endo/Other    Renal/GU CRFRenal disease  negative genitourinary   Musculoskeletal negative musculoskeletal ROS (+)   Abdominal   Peds negative pediatric ROS (+)  Hematology   Anesthesia Other Findings   Reproductive/Obstetrics negative OB ROS                            Anesthesia Physical Anesthesia Plan  ASA: 4 and emergent  Anesthesia Plan: General   Post-op Pain Management:    Induction: Intravenous and Rapid sequence  PONV Risk Score and Plan: Treatment may vary due to age or medical condition  Airway Management Planned: Oral ETT  Additional Equipment:   Intra-op Plan:   Post-operative Plan: Extubation in OR  Informed Consent: I have reviewed the patients History and Physical, chart, labs and discussed the procedure including the risks, benefits and alternatives for the proposed anesthesia with the patient or authorized representative who has indicated his/her understanding and acceptance.     Consent reviewed with POA  Plan Discussed with: CRNA  Anesthesia Plan Comments:        Anesthesia Quick Evaluation

## 2021-10-10 NOTE — Op Note (Addendum)
[facility] Gastroenterology Patient Name: Shawn Torres Procedure Date: 10/10/2021 1:23 PM MRN: [id number] Account #: [id number] Date of Birth: 1966-08-18 Admit Type: Inpatient Age: 55 Room: [facility] ENDO ROOM 1 Gender: Male Note Status: Finalized Instrument Name: Upper Endoscope [id number] Procedure:             Upper GI endoscopy Indications:           Hematemesis Providers:             [name] MD, MD Medicines:             General Anesthesia Complications:         No immediate complications. Procedure:             Pre-Anesthesia Assessment:                        - Prior to the procedure, a History and Physical was                         performed, and patient medications and allergies were                         reviewed. The patient's tolerance of previous                         anesthesia was also reviewed. The risks and benefits                         of the procedure and the sedation options and risks                         were discussed with the patient. All questions were                         answered, and informed consent was obtained. Prior                         Anticoagulants: The patient has taken no previous                         anticoagulant or antiplatelet agents. ASA Grade                         Assessment: IV - A patient with severe systemic                         disease that is a constant threat to life. After                         reviewing the risks and benefits, the patient was                         deemed in satisfactory condition to undergo the                         procedure.                        After obtaining informed consent, the endoscope was  passed under direct vision. Throughout the procedure,                         the patient's blood pressure, pulse, and oxygen                         saturations were monitored continuously. The Endoscope                         was introduced through  the mouth, and advanced to the                         second part of duodenum. The upper GI endoscopy was                         accomplished without difficulty. The patient tolerated                         the procedure well. Findings:      Grade III varices with stigmata of recent bleeding were found in the       lower third of the esophagus,. Red wale signs were present. Four bands       were successfully placed. There was no bleeding at the end of the       procedure.      The stomach was normal.      A single localized erosion without bleeding was found in the duodenal       bulb. 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 Banded.                        - Normal stomach.                        - Duodenal erosion without bleeding.                        - No specimens collected. Recommendation:        - Return patient to ICU for ongoing care.                        - NPO.                        - Continue present medications. Procedure Code(s):     --- Professional ---                        [phone], Esophagogastroduodenoscopy, flexible,                         transoral; with band ligation of esophageal/gastric                         varices Diagnosis Code(s):     --- Professional ---  K92.0, Hematemesis                        I85.01, Esophageal varices with bleeding                        K26.9, Duodenal ulcer, unspecified as acute or                         chronic, without hemorrhage or perforation CPT copyright 2019 [employer]. All rights reserved. The codes documented in this report are preliminary and upon coder review may  be revised to meet current compliance requirements. [name] MD, MD 10/10/2021 2:09:45 PM This report has been signed electronically. Number of Addenda: 0 Note Initiated On: 10/10/2021 1:23 PM Estimated Blood Loss:  Estimated blood loss:  none.      [facility]

## 2021-10-10 NOTE — Progress Notes (Signed)
Pt was transported to CT and back to CCu while on the vent.

## 2021-10-10 NOTE — Anesthesia Procedure Notes (Signed)
Procedure Name: Intubation Date/Time: 10/10/2021 1:55 PM Performed by: Irving Burton, CRNA Pre-anesthesia Checklist: Patient identified, Patient being monitored, Timeout performed, Emergency Drugs available and Suction available Patient Re-evaluated:Patient Re-evaluated prior to induction Oxygen Delivery Method: Circle system utilized Preoxygenation: Pre-oxygenation with 100% oxygen Induction Type: IV induction, Rapid sequence and Cricoid Pressure applied Ventilation: Mask ventilation without difficulty Laryngoscope Size: McGraph and 4 Grade View: Grade I Tube type: Oral Tube size: 7.5 mm Number of attempts: 1 Airway Equipment and Method: Stylet Placement Confirmation: ETT inserted through vocal cords under direct vision, positive ETCO2 and breath sounds checked- equal and bilateral Secured at: 21 cm Tube secured with: Tape Dental Injury: Teeth and Oropharynx as per pre-operative assessment

## 2021-10-10 NOTE — ED Provider Notes (Addendum)
[facility] Emergency Department Provider Note  ____________________________________________   Event Date/Time   First MD Initiated Contact with Patient 10/10/21 1010     (approximate)  I have reviewed the triage vital signs and the nursing notes.   HISTORY  Chief Complaint Altered Mental Status    HPI Shawn Torres is a 55 y.o. male  with h/o cirrhosis here with AMS. History provided primarily by daughter. Per report, pt has been not taking his lactulose for the last several days. He has had progressively worsening altered mental status, with confusion, agitation. He has also begun having vomiting this AM, with bright red blood in his emesis x 2. He does not have a reported/known h/o varices per daughter. H/o cirrhosis is 2/2 alcoholism, last drink was in June/July. She denies any recent trauma, fever, infectious sx. Pt confused, oriented to person only on arrival, limiting history. He demonstrates repetitive questioning and is unable to answer orientation questions.  Level 5 caveat invoked as remainder of history, ROS, and physical exam limited due to patient's AMS.         No past medical history on file.  Patient Active Problem List   Diagnosis Date Noted   Hyponatremia 05/24/2019   Pneumonia due to COVID-19 virus 05/24/2019   Transaminitis 05/24/2019   Alcohol dependence with uncomplicated withdrawal ([place]) 05/24/2019   Hypokalemia 05/24/2019   COVID-19 05/24/2019    No past surgical history on file.  Prior to Admission medications   Medication Sig Start Date End Date Taking? Authorizing Provider  blood glucose meter kit and supplies KIT Dispense based on patient and insurance preference. Check CBGs before each meal and before bedtime. Log in your results and show to your PCP in 1 week 05/30/19   Caren Griffins, MD  dexamethasone (DECADRON) 6 MG tablet Take 1 tablet (6 mg total) by mouth daily. 05/31/19   Caren Griffins, MD  furosemide  (LASIX) 20 MG tablet Take 1 tablet (20 mg total) by mouth daily. 04/29/21 05/29/21  Rudene Re, MD  insulin glargine (LANTUS) 100 unit/mL SOPN Inject 0.1 mLs (10 Units total) into the skin daily. Please dispense 100 appropriate needles as well. 05/30/19   Caren Griffins, MD  metFORMIN (GLUCOPHAGE) 500 MG tablet Take 1 tablet (500 mg total) by mouth 2 (two) times daily with a meal. 05/30/19 05/29/20  Caren Griffins, MD    Allergies Patient has no known allergies.  Family History  Family history unknown: Yes    Social History Social History   Tobacco Use   Smoking status: Never   Smokeless tobacco: Never  Vaping Use   Vaping Use: Never used  Substance Use Topics   Alcohol use: Yes   Drug use: Never    Review of Systems  Review of Systems  Unable to perform ROS: Mental status change  Constitutional:  Positive for fatigue.  Gastrointestinal:  Positive for nausea and vomiting.  Neurological:  Positive for weakness.  Psychiatric/Behavioral:  Positive for confusion.     ____________________________________________  PHYSICAL EXAM:      VITAL SIGNS: ED Triage Vitals  Enc Vitals Group     BP 10/10/21 1007 (!) 133/104     Pulse Rate 10/10/21 1007 88     Resp 10/10/21 1016 (!) 22     Temp 10/10/21 1007 98 F (36.7 C)     Temp Source 10/10/21 1007 Oral     SpO2 10/10/21 1007 95 %     Weight 10/10/21 1007  168 lb 12.8 oz (76.6 kg)     Height --      Head Circumference --      Peak Flow --      Pain Score --      Pain Loc --      Pain Edu? --      Excl. in [place]? --      Physical Exam Vitals and nursing note reviewed.  Constitutional:      General: He is not in acute distress.    Appearance: He is well-developed. He is ill-appearing.  HENT:     Head: Normocephalic and atraumatic.  Eyes:     Conjunctiva/sclera: Conjunctivae normal.  Cardiovascular:     Rate and Rhythm: Normal rate and regular rhythm.     Heart sounds: Normal heart sounds.  Pulmonary:     Effort:  Pulmonary effort is normal. No respiratory distress.     Breath sounds: No wheezing.  Abdominal:     General: There is distension.     Tenderness: There is no abdominal tenderness.     Hernia: No hernia is present.     Comments: + fluid wave, ventral hernia that is easily reduced, distended but not peritonitic  Musculoskeletal:     Cervical back: Neck supple.  Skin:    General: Skin is warm.     Capillary Refill: Capillary refill takes less than 2 seconds.     Findings: No rash.  Neurological:     Mental Status: He is alert. He is disoriented.     Motor: No abnormal muscle tone.     Comments: Oriented to person only. +asterixis, tremor. Unable to participate in full neuro exam. Gait unstable.      ____________________________________________   LABS (all labs ordered are listed, but only abnormal results are displayed)  Labs Reviewed  COMPREHENSIVE METABOLIC PANEL - Abnormal; Notable for the following components:      Result Value   Sodium 133 (*)    CO2 17 (*)    Glucose, Bld 170 (*)    BUN 38 (*)    Creatinine, Ser 1.68 (*)    Calcium 8.6 (*)    Total Protein 8.2 (*)    Albumin 2.9 (*)    AST 55 (*)    Total Bilirubin 3.6 (*)    GFR, Estimated 48 (*)    All other components within normal limits  CBC - Abnormal; Notable for the following components:   RBC 3.14 (*)    Hemoglobin 11.5 (*)    HCT 32.4 (*)    MCV 103.2 (*)    MCH 36.6 (*)    All other components within normal limits  PROTIME-INR - Abnormal; Notable for the following components:   Prothrombin Time 22.2 (*)    INR 2.0 (*)    All other components within normal limits  AMMONIA - Abnormal; Notable for the following components:   Ammonia 230 (*)    All other components within normal limits  LACTIC ACID, PLASMA - Abnormal; Notable for the following components:   Lactic Acid, Venous 4.7 (*)    All other components within normal limits  BLOOD GAS, VENOUS - Abnormal; Notable for the following components:    pCO2, Ven 34 (*)    Bicarbonate 17.9 (*)    Acid-base deficit 7.1 (*)    All other components within normal limits  RESP PANEL BY RT-PCR (FLU A&B, COVID) ARPGX2  CULTURE, BLOOD (ROUTINE X 2)  CULTURE, BLOOD (ROUTINE X 2)  ETHANOL  LACTIC ACID, PLASMA  POC OCCULT BLOOD, ED  TYPE AND SCREEN    ____________________________________________  EKG: Normal sinus rhythm, ventricular rate 97.  PR 133, QRS 97, QTc 460.  No acute ST elevations or depressions.  No ischemia or infarct. ________________________________________  RADIOLOGY All imaging, including plain films, CT scans, and ultrasounds, independently reviewed by me, and interpretations confirmed via formal radiology reads.  ED MD interpretation:     Official radiology report(s): No results found.  ____________________________________________  PROCEDURES   Procedure(s) performed (including Critical Care):  .Critical Care Performed by: Duffy Bruce, MD Authorized by: Duffy Bruce, MD   Critical care provider statement:    Critical care time (minutes):  30   Critical care time was exclusive of:  Separately billable procedures and treating other patients   Critical care was necessary to treat or prevent imminent or life-threatening deterioration of the following conditions:  Cardiac failure, circulatory failure and respiratory failure   Critical care was time spent personally by me on the following activities:  Development of treatment plan with patient or surrogate, discussions with consultants, evaluation of patient's response to treatment, examination of patient, ordering and review of laboratory studies, ordering and review of radiographic studies, ordering and performing treatments and interventions, pulse oximetry, re-evaluation of patient's condition and review of old charts   I assumed direction of critical care for this patient from another provider in my specialty: no     Care discussed with: admitting provider     ____________________________________________  INITIAL IMPRESSION / MDM / [place] / ED COURSE  As part of my medical decision making, I reviewed the following data within the [place] notes reviewed and incorporated, Old chart reviewed, Notes from prior ED visits, and [place] Controlled Substance Database       *Shawn Torres was evaluated in Emergency Department on 10/10/2021 for the symptoms described in the history of present illness. He was evaluated in the context of the global COVID-19 pandemic, which necessitated consideration that the patient might be at risk for infection with the SARS-CoV-2 virus that causes COVID-19. Institutional protocols and algorithms that pertain to the evaluation of patients at risk for COVID-19 are in a state of rapid change based on information released by regulatory bodies including the CDC and federal and state organizations. These policies and algorithms were followed during the patient's care in the ED.  Some ED evaluations and interventions may be delayed as a result of limited staffing during the pandemic.*     Medical Decision Making:  55 yo M here with AMS, confusion, hematemesis. Re: hematemesis, concern for varical bleed. Pt has decompensated cirrhosis though no known h/o varices. Hgb is stable. He is HDS here and protecting airway. IV octreotide, ppi, and rocephin given. Re: his confusion, suspect hepatic encephalopathy, likely multifactorial 2/2 poor adherence w/ lactulose as well as UGIB. Pt also has mild AKI on labs, though BP normal, suspect this could be 2/2 dehydration more so than true hepatorenal syndrome. Dr. Allen Norris consulted with GI, will take for EGD. Daughter updated and in agreement. I also discussed with Dr. Jonnie Finner of ICU, as pt will likely need ICU care for management post endoscopy.   ____________________________________________  FINAL CLINICAL IMPRESSION(S) / ED DIAGNOSES  Final diagnoses:  Hepatic  encephalopathy  Secondary esophageal varices with bleeding ([place])     MEDICATIONS GIVEN DURING THIS VISIT:  Medications  lactulose (CHRONULAC) enema 200 gm (has no administration in time range)  octreotide (SANDOSTATIN)  2 mcg/mL load via infusion 50 mcg (has no administration in time range)    And  octreotide (SANDOSTATIN) 500 mcg in sodium chloride 0.9 % 250 mL (2 mcg/mL) infusion (has no administration in time range)  pantoprazole (PROTONIX) 80 mg /NS 100 mL IVPB (80 mg Intravenous New Bag/Given 10/10/21 1114)  pantoprozole (PROTONIX) 80 mg /NS 100 mL infusion (has no administration in time range)  cefTRIAXone (ROCEPHIN) 2 g in sodium chloride 0.9 % 100 mL IVPB (has no administration in time range)     ED Discharge Orders     None        Note:  This document was prepared using Dragon voice recognition software and may include unintentional dictation errors.   Duffy Bruce, MD 10/10/21 1128    Duffy Bruce, MD 10/10/21 [phone]

## 2021-10-10 NOTE — Consult Note (Signed)
Shawn Lame, MD [address] [place],  [address] Phone: [phone] Fax : [phone]  Consultation  Referring Provider:     Dr. Ellender Torres Primary Care Physician:  Shawn Torres Primary Gastroenterologist:  Shawn Torres at [facility]         Reason for Consultation:     Hepatic encephalopathy with a upper GI bleed  Date of Admission:  10/10/2021 Date of Consultation:  10/10/2021         HPI:   Shawn Torres is a 55 y.o. male who has a history of cirrhosis and was admitted with a change in mental status.  The history was obtained by the ER primarily through the patient's daughter.  It was reported by the daughter to the ER physician that the patient has not been taking his lactulose and this precipitated his hepatic encephalopathy exacerbation with him being brought to the ER.  There is also report that he has had vomiting of bright red blood x2 by the ER doc.  The patient cirrhosis has been attributed to alcoholism.  He was drinking up until the middle of this year. The patient was in the ER at [facility] on 29 October for decompensated cirrhosis with hepatic encephalopathy.  It was reported that the patient has had poor follow-up and multiple admissions to the hospital for fluid overload.  He was supposed to be on Lasix 40 mg, spironolactone 100 mg lactulose 3 times a day prior to going to [facility].  He was sent home on Lasix 20 mg a day and Aldactone 50 mg a day in addition to lactulose 30 mL every 4 hours.  It does not appear that he followed these instructions and now is in the ER with hepatic encephalopathy agitation and pulling on his IV lines.  Torres past medical history on file.  Torres past surgical history on file.  Prior to Admission medications   Medication Sig Start Date End Date Taking? Authorizing Provider  folic acid (FOLVITE) 1 MG tablet Take 1 tablet by mouth daily. 09/03/21 09/03/22 Yes [provider]  lactulose (CHRONULAC) 10 GM/15ML solution Take 10 g by mouth 3  (three) times daily.   Yes [provider]  spironolactone (ALDACTONE) 50 MG tablet Take 1 tablet by mouth daily. 09/21/21  Yes [provider]  thiamine 100 MG tablet Take 1 tablet by mouth daily. 09/03/21  Yes [provider]  blood glucose meter kit and supplies KIT Dispense based on patient and insurance preference. Check CBGs before each meal and before bedtime. Log in your results and show to your PCP in 1 week 05/30/19   Shawn Griffins, MD  dexamethasone (DECADRON) 6 MG tablet Take 1 tablet (6 mg total) by mouth daily. Patient not taking: Reported on 10/10/2021 05/31/19   Shawn Griffins, MD  furosemide (LASIX) 20 MG tablet Take 1 tablet (20 mg total) by mouth daily. 04/29/21 05/29/21  Shawn Re, MD  insulin glargine (LANTUS) 100 unit/mL SOPN Inject 0.1 mLs (10 Units total) into the skin daily. Please dispense 100 appropriate needles as well. Patient not taking: Reported on 10/10/2021 05/30/19   Shawn Griffins, MD  metFORMIN (GLUCOPHAGE) 500 MG tablet Take 1 tablet (500 mg total) by mouth 2 (two) times daily with a meal. Patient not taking: Reported on 10/10/2021 05/30/19 05/29/20  Shawn Griffins, MD    Family History  Family history unknown: Yes     Social History   Tobacco Use  . Smoking status:  Never  . Smokeless tobacco: Never  Vaping Use  . Vaping Use: Never used  Substance Use Topics  . Alcohol use: Yes  . Drug use: Never    Allergies as of 10/10/2021  . (Torres Known Allergies)    Review of Systems:    All systems reviewed and negative except where noted in HPI.   Physical Exam:  Vital signs in last 24 hours: Temp:  [98 F (36.7 C)] 98 F (36.7 C) (11/20 1007) Pulse Rate:  [88] 88 (11/20 1007) Resp:  [22] 22 (11/20 1016) BP: (133)/(104) 133/104 (11/20 1007) SpO2:  [95 %] 95 % (11/20 1007) Weight:  [76.6 kg] 76.6 kg (11/20 1007)   General:   Confused and combative  Head:  Normocephalic and atraumatic. Eyes:   Torres icterus.    Conjunctiva pink. PERRLA. Ears:  Normal auditory acuity. Neck:  Supple; Torres masses or thyroidomegaly Lungs: Respirations even and unlabored. Lungs clear to auscultation bilaterally.   Torres wheezes, crackles, or rhonchi.  Heart:  Regular rate and rhythm;  Without murmur, clicks, rubs or gallops Abdomen:  Soft, nondistended, nontender. Normal bowel sounds. Torres appreciable masses or hepatomegaly.  Torres rebound or guarding.  Rectal:  Not performed. Msk:  Symmetrical without gross deformities.    Extremities:  Without edema, cyanosis or clubbing. Neurologic: Confused and unable to answer questions Skin:  Intact without significant lesions or rashes. Cervical Nodes:  Torres significant cervical adenopathy. Psych: Confused and agitated  LAB RESULTS: Recent Labs    10/10/21 1019  WBC 7.1  HGB 11.5*  HCT 32.4*  PLT 165   BMET Recent Labs    10/10/21 1019  NA 133*  K 4.1  CL 106  CO2 17*  GLUCOSE 170*  BUN 38*  CREATININE 1.68*  CALCIUM 8.6*   LFT Recent Labs    10/10/21 1019  PROT 8.2*  ALBUMIN 2.9*  AST 55*  ALT 29  ALKPHOS 119  BILITOT 3.6*   PT/INR Recent Labs    10/10/21 1019  LABPROT 22.2*  INR 2.0*    STUDIES: Torres results found.    Impression / Plan:   Assessment: Principal Problem:   Hematemesis   Shawn Torres is a 55 y.o. y/o male with who is here with hepatic encephalopathy and noncompliance with his medications with a history of cirrhosis due to alcohol abuse.  The patient has a history of ascites and now comes in with hematemesis in addition to his hepatic encephalopathy.  Plan:  The patient should be treated for hepatic encephalopathy while making sure that he does not become dehydrated with raising creatinine.  He should be restarted on his lactulose.  Due to his upper GI bleed the patient will be brought to the endoscopy unit and undergo an upper endoscopy.  I have discussed the risks and benefits with the patient's daughter including death and the  daughter gives permission to proceed with the procedure.  The patient has already been started on octreotide.  Further recommendations pending the upper endoscopy.  Thank you for involving me in the care of this patient.      LOS: 0 days   Shawn Lame, MD, [facility] 10/10/2021, 12:04 PM,  Pager [phone] 7am-5pm  Check AMION for 5pm -7am coverage and on weekends   Note: This dictation was prepared with Dragon dictation along with smaller phrase technology. Any transcriptional errors that result from this process are unintentional.

## 2021-10-10 NOTE — ED Notes (Signed)
Assumed care of patient. Informed that he vomited bright red blood during triage. No active vomiting or bleeding at this time. Confused, unable to follow commands. Continues to pull wires off and attempt to climb off of the end of the bed. Daughter at bedside, interpreter at bedside, doctor at bedside.

## 2021-10-10 NOTE — H&P (Signed)
NAME:  Shawn Torres, MRN:  [id number], DOB:  01-30-1966, LOS: 0 ADMISSION DATE:  10/10/2021, CONSULTATION DATE:  10/10/21  REFERRING MD:  Ellender Hose, CHIEF COMPLAINT:  Hematemesis   History of Present Illness:  55 y.o. male with h/o decompensated alcoholic cirrhosis with portal hypertension and ascites presenting with encephalopathy in the setting of hyperammonemia (NH3 230 on arrival, previously 46 on 09/19/21), noted to have active hematemesis in the ED. Additional notable findings include AKI with creatinine of 1.68 (baseline 0.9 to 1.0), CO2 17, lactate 4.7, Hgb 11.5, INR 2.0. Hemodynamics stable in ED. GI consulted with plans for EGD.  Pertinent  Medical History  Alcoholic cirrhosis with portal hypertension DM2  Significant Hospital Events: Including procedures, antibiotic start and stop dates in addition to other pertinent events   11/20: Admitted with hematemesis, encephalopathy  Interim History / Subjective:  N/A  Objective   Blood pressure 118/79, pulse 74, temperature 98 F (36.7 C), temperature source Oral, resp. rate 17, weight 76.6 kg, SpO2 100 %.    Vent Mode: PRVC FiO2 (%):  [40 %] 40 % Set Rate:  [16 bmp] 16 bmp Vt Set:  [450 mL] 450 mL PEEP:  [5 cmH20] 5 cmH20  No intake or output data in the 24 hours ending 10/10/21 1511 Filed Weights   10/10/21 1007  Weight: 76.6 kg    Examination: General: Hispanic male, intubated and sedated HENT: scleral icterus, ETT in place Lungs: CTA b/l, no W/C/R Cardiovascular: RRR, no M/R/G Abdomen: soft, non-distended, no fluid wave, +BS Extremities: no C/C/E Neuro: intubated and sedated; RASS -4 GU: deferred  Resolved Hospital Problem list   N/A  Assessment & Plan:  Hematemesis in patient with known cirrhosis, concern for variceal bleed Coagulopathy of liver disease - GI consult (Dr. Allen Norris) with plans for EGD and possible banding - Unclear if pt will be intubated on arrival to ICU; he will need intubation for EGD - Protonix  infusion and octreotide x72h, ceftriaxone x5d - Hold VTE ppx - Ensure 2 large-bore PIV above A/C - Monitor H&H and hemodynamics - Will discuss IV vitamin K with GI given coagulopathy and active bleeding  Hepatic encephalopathy 2/2 hyperammonemia Decompensated alcoholic cirrhosis with ascites - GI following as above - Obtain CT head when stable - Start lactulose enemas from below; if no varices on EGD, can place NGT for oral administration - Trend ammonia initially to ensure down-trend - Daily CMP, INR; address last alcohol use; may consider transfer to liver center  AKI, possible hepatorenal syndrome versus pre-renal - Abdomen not tense or distended, defer paracentesis - Reassess renal function after initial resuscitative measures - Will consider albumin for hepatorenal syndrome  Lactic acidosis - Elevation in setting of acute illness, cirrhosis - Trend to close  DM2 - CBGs, SSI - No basal bolus  Best Practice (right click and "Reselect all SmartList Selections" daily)   Diet/type: NPO DVT prophylaxis: SCD GI prophylaxis: PPI Lines: N/A Foley:  N/A Code Status:  full code Last date of multidisciplinary goals of care discussion [N/A]  I Assessed the need for Labs I Assessed the need for Foley I Assessed the need for Central Venous Line Family Discussion when available I Assessed the need for Mobilization I made an Assessment of medications to be adjusted accordingly Safety Risk assessment completed  Labs   CBC: Recent Labs  Lab 10/10/21 1019  WBC 7.1  HGB 11.5*  HCT 32.4*  MCV 103.2*  PLT 268    Basic Metabolic Panel: Recent Labs  Lab 10/10/21 1019  NA 133*  K 4.1  CL 106  CO2 17*  GLUCOSE 170*  BUN 38*  CREATININE 1.68*  CALCIUM 8.6*   GFR: Estimated Creatinine Clearance: 48.5 mL/min (A) (by C-G formula based on SCr of 1.68 mg/dL (H)). Recent Labs  Lab 10/10/21 1019  WBC 7.1  LATICACIDVEN 4.7*    Liver Function Tests: Recent Labs  Lab  10/10/21 1019  AST 55*  ALT 29  ALKPHOS 119  BILITOT 3.6*  PROT 8.2*  ALBUMIN 2.9*   No results for input(s): LIPASE, AMYLASE in the last 168 hours. Recent Labs  Lab 10/10/21 1019  AMMONIA 230*    ABG    Component Value Date/Time   HCO3 17.9 (L) 10/10/2021 1019   ACIDBASEDEF 7.1 (H) 10/10/2021 1019   O2SAT 27.5 10/10/2021 1019     Coagulation Profile: Recent Labs  Lab 10/10/21 1019  INR 2.0*    Cardiac Enzymes: No results for input(s): CKTOTAL, CKMB, CKMBINDEX, TROPONINI in the last 168 hours.  HbA1C: Hgb A1c MFr Bld  Date/Time Value Ref Range Status  05/24/2019 06:47 AM 10.8 (H) 4.8 - 5.6 % Final    Comment:    (NOTE) Pre diabetes:          5.7%-6.4% Diabetes:              >6.4% Glycemic control for   <7.0% adults with diabetes     CBG: Recent Labs  Lab 10/10/21 1445  GLUCAP 115*    Review of Systems:   Unable to assess due to patient's clinical status.  Past Medical History:  He,  has no past medical history on file.   Surgical History:  No past surgical history on file.   Social History:   reports that he has never smoked. He has never used smokeless tobacco. He reports current alcohol use. He reports that he does not use drugs.   Family History:  His Family history is unknown by patient.   Allergies No Known Allergies   Home Medications  Prior to Admission medications   Medication Sig Start Date End Date Taking? Authorizing Provider  blood glucose meter kit and supplies KIT Dispense based on patient and insurance preference. Check CBGs before each meal and before bedtime. Log in your results and show to your PCP in 1 week 05/30/19   Caren Griffins, MD  dexamethasone (DECADRON) 6 MG tablet Take 1 tablet (6 mg total) by mouth daily. 05/31/19   Caren Griffins, MD  furosemide (LASIX) 20 MG tablet Take 1 tablet (20 mg total) by mouth daily. 04/29/21 05/29/21  Rudene Re, MD  insulin glargine (LANTUS) 100 unit/mL SOPN Inject 0.1 mLs  (10 Units total) into the skin daily. Please dispense 100 appropriate needles as well. 05/30/19   Caren Griffins, MD  metFORMIN (GLUCOPHAGE) 500 MG tablet Take 1 tablet (500 mg total) by mouth 2 (two) times daily with a meal. 05/30/19 05/29/20  Gherghe, Vella Redhead, MD      CASE DISCUSSED IN MULTIDISCIPLINARY ROUNDS WITH ICU TEAM  Overall, patient is critically ill, prognosis is guarded.  Patient with Multiorgan failure and at high risk for cardiac arrest and death.   Critical care time: 45 minutes

## 2021-10-10 NOTE — ED Notes (Signed)
Report given to OR nurse. Informed they would be to pick him up shortly.

## 2021-10-11 ENCOUNTER — Encounter: Payer: Self-pay | Admitting: Gastroenterology

## 2021-10-11 DIAGNOSIS — I8511 Secondary esophageal varices with bleeding: Secondary | ICD-10-CM

## 2021-10-11 LAB — CBC
HCT: 24.8 % — ABNORMAL LOW (ref 39.0–52.0)
HCT: 27.1 % — ABNORMAL LOW (ref 39.0–52.0)
HCT: 25.6 % — ABNORMAL LOW (ref 39.0–52.0)
HCT: 26 % — ABNORMAL LOW (ref 39.0–52.0)
Hemoglobin: 8.6 g/dL — ABNORMAL LOW (ref 13.0–17.0)
Hemoglobin: 9.6 g/dL — ABNORMAL LOW (ref 13.0–17.0)
Hemoglobin: 8.8 g/dL — ABNORMAL LOW (ref 13.0–17.0)
Hemoglobin: 8.8 g/dL — ABNORMAL LOW (ref 13.0–17.0)
MCH: 35.1 pg — ABNORMAL HIGH (ref 26.0–34.0)
MCH: 36.6 pg — ABNORMAL HIGH (ref 26.0–34.0)
MCH: 34.8 pg — ABNORMAL HIGH (ref 26.0–34.0)
MCH: 34.6 pg — ABNORMAL HIGH (ref 26.0–34.0)
MCHC: 34.7 g/dL (ref 30.0–36.0)
MCHC: 35.4 g/dL (ref 30.0–36.0)
MCHC: 34.4 g/dL (ref 30.0–36.0)
MCHC: 33.8 g/dL (ref 30.0–36.0)
MCV: 101.2 fL — ABNORMAL HIGH (ref 80.0–100.0)
MCV: 103.4 fL — ABNORMAL HIGH (ref 80.0–100.0)
MCV: 101.2 fL — ABNORMAL HIGH (ref 80.0–100.0)
MCV: 102.4 fL — ABNORMAL HIGH (ref 80.0–100.0)
Platelets: 110 10*3/uL — ABNORMAL LOW (ref 150–400)
Platelets: 102 10*3/uL — ABNORMAL LOW (ref 150–400)
Platelets: 106 10*3/uL — ABNORMAL LOW (ref 150–400)
Platelets: 101 10*3/uL — ABNORMAL LOW (ref 150–400)
RBC: 2.45 MIL/uL — ABNORMAL LOW (ref 4.22–5.81)
RBC: 2.62 MIL/uL — ABNORMAL LOW (ref 4.22–5.81)
RBC: 2.53 MIL/uL — ABNORMAL LOW (ref 4.22–5.81)
RBC: 2.54 MIL/uL — ABNORMAL LOW (ref 4.22–5.81)
RDW: 14 % (ref 11.5–15.5)
RDW: 14.2 % (ref 11.5–15.5)
RDW: 14.3 % (ref 11.5–15.5)
RDW: 14.2 % (ref 11.5–15.5)
WBC: 6 10*3/uL (ref 4.0–10.5)
WBC: 6.7 10*3/uL (ref 4.0–10.5)
WBC: 5.8 10*3/uL (ref 4.0–10.5)
WBC: 6.5 10*3/uL (ref 4.0–10.5)
nRBC: 0 % (ref 0.0–0.2)
nRBC: 0 % (ref 0.0–0.2)
nRBC: 0 % (ref 0.0–0.2)
nRBC: 0 % (ref 0.0–0.2)

## 2021-10-11 LAB — COMPREHENSIVE METABOLIC PANEL
ALT: 21 U/L (ref 0–44)
AST: 35 U/L (ref 15–41)
Albumin: 2.1 g/dL — ABNORMAL LOW (ref 3.5–5.0)
Alkaline Phosphatase: 75 U/L (ref 38–126)
Anion gap: 6 (ref 5–15)
BUN: 39 mg/dL — ABNORMAL HIGH (ref 6–20)
CO2: 19 mmol/L — ABNORMAL LOW (ref 22–32)
Calcium: 8.1 mg/dL — ABNORMAL LOW (ref 8.9–10.3)
Chloride: 109 mmol/L (ref 98–111)
Creatinine, Ser: 1.62 mg/dL — ABNORMAL HIGH (ref 0.61–1.24)
GFR, Estimated: 50 mL/min — ABNORMAL LOW (ref >60)
Glucose, Bld: 111 mg/dL — ABNORMAL HIGH (ref 70–99)
Potassium: 4.5 mmol/L (ref 3.5–5.1)
Sodium: 134 mmol/L — ABNORMAL LOW (ref 135–145)
Total Bilirubin: 2.9 mg/dL — ABNORMAL HIGH (ref 0.3–1.2)
Total Protein: 6 g/dL — ABNORMAL LOW (ref 6.5–8.1)

## 2021-10-11 LAB — BLOOD GAS, VENOUS
Acid-base deficit: 6.1 mmol/L — ABNORMAL HIGH (ref 0.0–2.0)
Bicarbonate: 17.3 mmol/L — ABNORMAL LOW (ref 20.0–28.0)
FIO2: 24
MECHVT: 450 mL
Mechanical Rate: 16
O2 Saturation: 81.7 %
PEEP: 5 cmH2O
Patient temperature: 37
pCO2, Ven: 28 mmHg — ABNORMAL LOW (ref 44.0–60.0)
pH, Ven: 7.4 (ref 7.250–7.430)
pO2, Ven: 46 mmHg — ABNORMAL HIGH (ref 32.0–45.0)

## 2021-10-11 LAB — HEMOGLOBIN A1C
Hgb A1c MFr Bld: 4.4 % — ABNORMAL LOW (ref 4.8–5.6)
Mean Plasma Glucose: 79.58 mg/dL

## 2021-10-11 LAB — PHOSPHORUS: Phosphorus: 4.3 mg/dL (ref 2.5–4.6)

## 2021-10-11 LAB — PROTIME-INR
INR: 2.4 — ABNORMAL HIGH (ref 0.8–1.2)
Prothrombin Time: 26.4 seconds — ABNORMAL HIGH (ref 11.4–15.2)

## 2021-10-11 LAB — GLUCOSE, CAPILLARY
Glucose-Capillary: 106 mg/dL — ABNORMAL HIGH (ref 70–99)
Glucose-Capillary: 106 mg/dL — ABNORMAL HIGH (ref 70–99)
Glucose-Capillary: 109 mg/dL — ABNORMAL HIGH (ref 70–99)
Glucose-Capillary: 90 mg/dL (ref 70–99)
Glucose-Capillary: 88 mg/dL (ref 70–99)
Glucose-Capillary: 89 mg/dL (ref 70–99)

## 2021-10-11 LAB — MAGNESIUM: Magnesium: 1.8 mg/dL (ref 1.7–2.4)

## 2021-10-11 LAB — AMMONIA: Ammonia: 84 umol/L — ABNORMAL HIGH (ref 9–35)

## 2021-10-11 LAB — HIV ANTIBODY (ROUTINE TESTING W REFLEX): HIV Screen 4th Generation wRfx: NONREACTIVE

## 2021-10-11 LAB — TRIGLYCERIDES: Triglycerides: 47 mg/dL (ref <150)

## 2021-10-11 MED ORDER — MIDAZOLAM HCL 2 MG/2ML IJ SOLN
1.0000 mg | INTRAMUSCULAR | Status: DC | PRN
  Administered 2021-10-11: 1 mg via INTRAVENOUS
  Filled 2021-10-11 (×2): qty 2

## 2021-10-11 NOTE — Progress Notes (Signed)
Circulatory shock DDx: Vasovagal or hypovolemia/hemorrhagic Septic and cardiogenic less likely Called bedside by and care nurse for sudden and significant drop in blood pressure into the 60's/40's, confirmed with rechecks.  Care nurse just completed lactulose enema with removal of 400 mL of stool.  Suspect vasovagal however due to recent esophageal banding will work-up patient for hypovolemic/hemorrhagic shock - 1 L saline bolus - 25 g albumin IV - Levophed drip initiated, quickly titrated up to 10 mcg - Stat CBC - Due to significant/sudden need for vasopressors in the setting of co morbidities, will place CVC emergently for better IV access   Cheryll Cockayne Rust-Chester, AGACNP-BC Acute Care Nurse Practitioner [facility]   [phone] / [phone] Please see Amion for pager details.

## 2021-10-11 NOTE — Consult Note (Signed)
PHARMACY CONSULT NOTE - FOLLOW UP  Pharmacy Consult for Electrolyte Monitoring and Replacement   Recent Labs: Potassium (mmol/L)  Date Value  10/11/2021 4.5   Magnesium (mg/dL)  Date Value  35/46/5681 1.8   Calcium (mg/dL)  Date Value  27/51/7001 8.1 (L)   Albumin (g/dL)  Date Value  74/94/4967 2.1 (L)   Phosphorus (mg/dL)  Date Value  59/16/3846 4.3   Sodium (mmol/L)  Date Value  10/11/2021 134 (L)     Assessment: 55yo w/ h/o  decompensated alcoholic cirrhosis c/b portal hypertension and ascites presenting with encephalopathy ISO hyperammonemia & active hematemesis in the ED. Found to have AKI likely 2/2 hepatorenal s/o. Pharmacy consulted for mgmt of electrolytes.  Goal of Therapy:  Lytes WNL  Plan:  All lytes WNL, no repletion req'd at this time. Will CTM and adjust PRN. Labs ordered.  Martyn Malay, PharmD, [facility] Clinical Pharmacist 10/11/2021 8:23 AM

## 2021-10-11 NOTE — Plan of Care (Signed)
  Problem: Clinical Measurements: Goal: Will remain free from infection Outcome: Progressing Goal: Diagnostic test results will improve Outcome: Progressing Goal: Respiratory complications will improve Outcome: Progressing   Problem: Pain Managment: Goal: General experience of comfort will improve Outcome: Progressing   Problem: Safety: Goal: Ability to remain free from injury will improve Outcome: Progressing   Problem: Clinical Measurements: Goal: Ability to maintain clinical measurements within normal limits will improve Outcome: Not Progressing Goal: Cardiovascular complication will be avoided Outcome: Not Progressing

## 2021-10-11 NOTE — Procedures (Signed)
Central Venous Catheter Insertion Procedure Note  Aakash Hollomon  [id number]  [birth year]  Date:10/11/21  Time:12:02 AM   Provider Performing:Everli Rother L Rust-Chester   Procedure: Insertion of Non-tunneled Central Venous Catheter(36556) with US guidance (83382)   Indication(s) Medication administration  Consent Unable to obtain consent due to emergent nature of procedure.  Anesthesia Topical only with 1% lidocaine   Timeout Verified patient identification, verified procedure, site/side was marked, verified correct patient position, special equipment/implants available, medications/allergies/relevant history reviewed, required imaging and test results available.  Sterile Technique Maximal sterile technique including full sterile barrier drape, hand hygiene, sterile gown, sterile gloves, mask, hair covering, sterile ultrasound probe cover (if used).  Procedure Description Area of catheter insertion was cleaned with chlorhexidine and draped in sterile fashion.  With real-time ultrasound guidance a central venous catheter was placed into the left internal jugular vein. Nonpulsatile blood flow and easy flushing noted in all ports.  The catheter was sutured in place and sterile dressing applied.  Complications/Tolerance None; patient tolerated the procedure well. Chest X-ray is ordered to verify placement for internal jugular or subclavian cannulation.   Chest x-ray is not ordered for femoral cannulation.  EBL Minimal  Specimen(s) None   Betsey Holiday, AGACNP-BC Acute Care Nurse Practitioner [facility]   [phone] / [phone] Please see Amion for pager details.

## 2021-10-11 NOTE — Anesthesia Postprocedure Evaluation (Signed)
Anesthesia Post Note  Patient: Shawn Torres  Procedure(s) Performed: ESOPHAGOGASTRODUODENOSCOPY (EGD)  Patient location during evaluation: SICU Anesthesia Type: General Level of consciousness: sedated and patient remains intubated per anesthesia plan Pain management: pain level controlled Vital Signs Assessment: post-procedure vital signs reviewed and stable Respiratory status: patient remains intubated per anesthesia plan and patient on ventilator - see flowsheet for VS Cardiovascular status: stable Postop Assessment: no apparent nausea or vomiting Anesthetic complications: no   No notable events documented.   Last Vitals:  Vitals:   10/11/21 0600 10/11/21 0724  BP: 123/76   Pulse: (!) 58   Resp: 15   Temp:    SpO2: 100% 100%    Last Pain:  Vitals:   10/11/21 0400  TempSrc: Oral  PainSc:                  Jules Schick

## 2021-10-11 NOTE — Progress Notes (Signed)
Initial Nutrition Assessment  DOCUMENTATION CODES:   Non-severe (moderate) malnutrition in context of chronic illness  INTERVENTION:   If tube feeds initiated, recommend:  Vital 1.2 @60ml /hr- Initiate at 25ml/hr and increase by 71ml/hr q 8 hours until goal rate is reached.   Free water flushes 34ml q4 hours to maintain tube patency   Regimen provides 1728kcal/day, 108g/day protein and 1327ml/day free water.   Recommend thiamine and folic acid daily in the setting of etoh abuse.   Pt at high refeed risk; recommend monitor potassium, magnesium and phosphorus labs daily until stable  NUTRITION DIAGNOSIS:   Moderate Malnutrition related to chronic illness (cirrhosis, etoh abuse) as evidenced by moderate fat depletion, moderate muscle depletion.  GOAL:   Provide needs based on ASPEN/SCCM guidelines  MONITOR:   Vent status, Labs, Weight trends, Skin, I & O's  REASON FOR ASSESSMENT:   Ventilator    ASSESSMENT:   55 y.o. male with h/o decompensated alcoholic cirrhosis with portal hypertension and ascites who is admitted with encephalopathy in the setting of hyperammonemia, hematemesis and AKI now s/p EGD 11/20 with banding of esophageal varices.  Pt sedated and intubated. No OGT in place r/t esophageal banding. Plan is for SBTs and possible extubation tomorrow. Suspect pt with poor oral intake at baseline r/t etoh abuse. There is limited weight history in chart but it appears pt has lost 69lbs(28%) over the past 6 months; this is severe weight loss. RD unsure how much weight loss is related to fluid changes as pt with ascites.   Medications reviewed and include: insulin, lactulose, ceftriaxone, levophed, protonix, propofol  Labs reviewed: Na 134(L), BUN 39(H), creat 1.62(H), P 4.3 wnl, Mg 1.8 wnl Ammonia 84(H) Hgb 9.6(L), Hct 27.1(L). MCV 103.4(H), MCH 36.6(H) Cbgs- 106, 106 x 24 hrs AIC 4.4(L)- 11/20  Patient is currently intubated on ventilator support MV: 11.61  L/min Temp (24hrs), Avg:97.5 F (36.4 C), Min:97.4 F (36.3 C), Max:97.6 F (36.4 C)  Propofol: 11.49 ml/hr- provides 303kcal/day   MAP- >25mmHg   UOP- 821ml/day   NUTRITION - FOCUSED PHYSICAL EXAM:  Flowsheet Row Most Recent Value  Orbital Region Moderate depletion  Upper Arm Region Severe depletion  Thoracic and Lumbar Region Moderate depletion  Buccal Region Moderate depletion  Temple Region Moderate depletion  Clavicle Bone Region Mild depletion  Clavicle and Acromion Bone Region Mild depletion  Scapular Bone Region Moderate depletion  Dorsal Hand No depletion  Patellar Region Moderate depletion  Anterior Thigh Region Moderate depletion  Posterior Calf Region Moderate depletion  Edema (RD Assessment) None  Hair Reviewed  Eyes Reviewed  Mouth Reviewed  Skin Reviewed  Nails Reviewed   Diet Order:   Diet Order             Diet NPO time specified  Diet effective now                  EDUCATION NEEDS:   No education needs have been identified at this time  Skin:  Skin Assessment: Reviewed RN Assessment (ecchymosis)  Last BM:  11/21  Height:   Ht Readings from Last 1 Encounters:  10/11/21 5\' 5"  (1.651 m)    Weight:   Wt Readings from Last 1 Encounters:  10/11/21 82.1 kg    Ideal Body Weight:  61.8 kg  BMI:  Body mass index is 30.12 kg/m.  Estimated Nutritional Needs:   Kcal:  1705kcal/day  Protein:  105-120g/day  Fluid:  1.5-1.8L/day  MS, RD, LDN Please refer to [facility]  for RD and/or RD on-call/weekend/after hours pager

## 2021-10-11 NOTE — Progress Notes (Signed)
NAME:  Shawn Torres, MRN:  [id number], DOB:  15-Oct-1966, LOS: 1 ADMISSION DATE:  10/10/2021, CONSULTATION DATE:  10/11/21  REFERRING MD:  Ellender Hose, CHIEF COMPLAINT:  Hematemesis   History of Present Illness:  55 y.o. male with h/o decompensated alcoholic cirrhosis with portal hypertension and ascites presenting with encephalopathy in the setting of hyperammonemia (NH3 230 on arrival, previously 99 on 09/19/21), noted to have active hematemesis in the ED. Additional notable findings include AKI with creatinine of 1.68 (baseline 0.9 to 1.0), CO2 17, lactate 4.7, Hgb 11.5, INR 2.0. Hemodynamics stable in ED. GI consulted with plans for EGD.  10/11/21- patient is on MV , weaning of vasopressors today. Preparing for SBT if able to stabalize.   Pertinent  Medical History  Alcoholic cirrhosis with portal hypertension DM2  Significant Hospital Events: Including procedures, antibiotic start and stop dates in addition to other pertinent events   11/20: Admitted with hematemesis, encephalopathy  Interim History / Subjective:  N/A  Objective   Blood pressure 117/77, pulse 62, temperature 97.6 F (36.4 C), resp. rate 10, weight 82.1 kg, SpO2 100 %.    Vent Mode: PRVC FiO2 (%):  [24 %-40 %] 24 % Set Rate:  [16 bmp] 16 bmp Vt Set:  [450 mL] 450 mL PEEP:  [5 cmH20] 5 cmH20 Plateau Pressure:  [13 cmH20] 13 cmH20   Intake/Output Summary (Last 24 hours) at 10/11/2021 0914 Last data filed at 10/11/2021 2761 Gross per 24 hour  Intake 1336.73 ml  Output 1500 ml  Net -163.27 ml   Filed Weights   10/10/21 1007 10/11/21 0500  Weight: 76.6 kg 82.1 kg    Examination: General:age appropriate intubated and sedated HENT: scleral icterus, ETT in place Lungs: CTA b/l, no W/C/R Cardiovascular: RRR, no M/R/G Abdomen: soft, non-distended, no fluid wave, +BS Extremities: no C/C/E Neuro: intubated and sedated; RASS -4 GCS 4T GU: deferred  Resolved Hospital Problem list   N/A  Assessment & Plan:   Hematemesis in patient with known cirrhosis, concern for variceal bleed Coagulopathy of liver disease - GI consult (Dr. Allen Norris) with plans for EGD and possible banding - Unclear if pt will be intubated on arrival to ICU; he will need intubation for EGD - Protonix infusion and octreotide x72h, ceftriaxone x5d - Hold VTE ppx - Ensure 2 large-bore PIV above A/C - Monitor H&H and hemodynamics - Will discuss IV vitamin K with GI given coagulopathy and active bleeding   Hepatic encephalopathy 2/2 hyperammonemia Decompensated alcoholic cirrhosis with ascites - GI following as above -  CT head -no bleed noted - Start lactulose enemas from below; if no varices on EGD, can place NGT for oral administration - Trend ammonia initially to ensure down-trend - Daily CMP, INR; address last alcohol use; may consider transfer to liver center   AKI, possible hepatorenal syndrome versus pre-renal - Abdomen not tense or distended, defer paracentesis - Reassess renal function after initial resuscitative measures - Will consider albumin for hepatorenal syndrome  Lactic acidosis - Elevation in setting of acute illness, cirrhosis - Trend to close  DM2 - CBGs, SSI - No basal bolus  Best Practice (right click and "Reselect all SmartList Selections" daily)   Diet/type: NPO DVT prophylaxis: SCD GI prophylaxis: PPI Lines: N/A Foley:  N/A Code Status:  full code Last date of multidisciplinary goals of care discussion [N/A]  I Assessed the need for Labs I Assessed the need for Foley I Assessed the need for Central Venous Line Family Discussion when available I Assessed  the need for Mobilization I made an Assessment of medications to be adjusted accordingly Safety Risk assessment completed  Labs   CBC: Recent Labs  Lab 10/10/21 1019 10/10/21 1622 10/10/21 2113 10/10/21 2308 10/11/21 0354  WBC 7.1 7.3 6.0 6.6 6.0  HGB 11.5* 10.3* 9.0* 8.8* 8.6*  HCT 32.4* 27.9* 25.2* 24.8* 24.8*  MCV  103.2* 101.1* 100.8* 100.4* 101.2*  PLT 165 135* 109* 106* 110*     Basic Metabolic Panel: Recent Labs  Lab 10/10/21 1019 10/10/21 1622 10/11/21 0354  NA 133*  --  134*  K 4.1  --  4.5  CL 106  --  109  CO2 17*  --  19*  GLUCOSE 170*  --  111*  BUN 38*  --  39*  CREATININE 1.68*  --  1.62*  CALCIUM 8.6*  --  8.1*  MG  --  2.0 1.8  PHOS  --  4.1 4.3    GFR: Estimated Creatinine Clearance: 52 mL/min (A) (by C-G formula based on SCr of 1.62 mg/dL (H)). Recent Labs  Lab 10/10/21 1019 10/10/21 1454 10/10/21 1622 10/10/21 1858 10/10/21 2113 10/10/21 2308 10/11/21 0354  WBC 7.1  --  7.3  --  6.0 6.6 6.0  LATICACIDVEN 4.7* 1.5  --  2.1* 1.7  --   --      Liver Function Tests: Recent Labs  Lab 10/10/21 1019 10/11/21 0354  AST 55* 35  ALT 29 21  ALKPHOS 119 75  BILITOT 3.6* 2.9*  PROT 8.2* 6.0*  ALBUMIN 2.9* 2.1*    No results for input(s): LIPASE, AMYLASE in the last 168 hours. Recent Labs  Lab 10/10/21 1019 10/11/21 0354  AMMONIA 230* 84*     ABG    Component Value Date/Time   PHART 7.37 10/10/2021 1605   PCO2ART 29 (L) 10/10/2021 1605   PO2ART 110 (H) 10/10/2021 1605   HCO3 17.3 (L) 10/11/2021 0500   ACIDBASEDEF 6.1 (H) 10/11/2021 0500   O2SAT 81.7 10/11/2021 0500      Coagulation Profile: Recent Labs  Lab 10/10/21 1019 10/11/21 0354  INR 2.0* 2.4*     Cardiac Enzymes: No results for input(s): CKTOTAL, CKMB, CKMBINDEX, TROPONINI in the last 168 hours.  HbA1C: Hgb A1c MFr Bld  Date/Time Value Ref Range Status  10/10/2021 04:22 PM 4.4 (L) 4.8 - 5.6 % Final    Comment:    (NOTE) Pre diabetes:          5.7%-6.4%  Diabetes:              >6.4%  Glycemic control for   <7.0% adults with diabetes   05/24/2019 06:47 AM 10.8 (H) 4.8 - 5.6 % Final    Comment:    (NOTE) Pre diabetes:          5.7%-6.4% Diabetes:              >6.4% Glycemic control for   <7.0% adults with diabetes     CBG: Recent Labs  Lab 10/10/21 1445  10/10/21 1913 10/10/21 2307 10/11/21 0323 10/11/21 0729  GLUCAP 115* 83 87 106* 106*     Review of Systems:   Unable to assess due to patient's clinical status.  Past Medical History:  He,  has no past medical history on file.   Surgical History:  No past surgical history on file.   Social History:   reports that he has never smoked. He has never used smokeless tobacco. He reports current alcohol use. He reports that  he does not use drugs.   Family History:  His Family history is unknown by patient.   Allergies No Known Allergies   Home Medications  Prior to Admission medications   Medication Sig Start Date End Date Taking? Authorizing Provider  blood glucose meter kit and supplies KIT Dispense based on patient and insurance preference. Check CBGs before each meal and before bedtime. Log in your results and show to your PCP in 1 week 05/30/19   Caren Griffins, MD  dexamethasone (DECADRON) 6 MG tablet Take 1 tablet (6 mg total) by mouth daily. 05/31/19   Caren Griffins, MD  furosemide (LASIX) 20 MG tablet Take 1 tablet (20 mg total) by mouth daily. 04/29/21 05/29/21  Rudene Re, MD  insulin glargine (LANTUS) 100 unit/mL SOPN Inject 0.1 mLs (10 Units total) into the skin daily. Please dispense 100 appropriate needles as well. 05/30/19   Caren Griffins, MD  metFORMIN (GLUCOPHAGE) 500 MG tablet Take 1 tablet (500 mg total) by mouth 2 (two) times daily with a meal. 05/30/19 05/29/20  Gherghe, Vella Redhead, MD      CASE DISCUSSED IN MULTIDISCIPLINARY ROUNDS WITH ICU TEAM   Critical care provider statement:   Total critical care time: 33 minutes   Performed by: Lanney Gins MD   Critical care time was exclusive of separately billable procedures and treating other patients.   Critical care was necessary to treat or prevent imminent or life-threatening deterioration.   Critical care was time spent personally by me on the following activities: development of treatment plan with  patient and/or surrogate as well as nursing, discussions with consultants, evaluation of patient's response to treatment, examination of patient, obtaining history from patient or surrogate, ordering and performing treatments and interventions, ordering and review of laboratory studies, ordering and review of radiographic studies, pulse oximetry and re-evaluation of patient's condition.    Ottie Glazier, M.D.  Pulmonary & Critical Care Medicine

## 2021-10-11 NOTE — Progress Notes (Signed)
Shawn Lame, MD [facility]   [address] [place], [place] [address] Phone: [phone] Fax : [phone]   Subjective: The patient is intubated in the ICU.  The patient had an upper endoscopy with banding of the esophageal varices.  The patient's hemoglobin has been stable and his blood pressure has been on the low side.   Objective: Vital signs in last 24 hours: Vitals:   10/11/21 1050 10/11/21 1100 10/11/21 1130 10/11/21 1200  BP:  '95/70 90/66 98/74 '  Pulse:  (!) 59 (!) 58 (!) 59  Resp:  16 10 (!) 9  Temp:   (!) 97.5 F (36.4 C)   TempSrc:      SpO2:  100% 100% 100%  Weight:      Height: '5\' 5"'  (1.651 m)      Weight change:   Intake/Output Summary (Last 24 hours) at 10/11/2021 1236 Last data filed at 10/11/2021 1213 Gross per 24 hour  Intake 1336.73 ml  Output 1700 ml  Net -363.27 ml     Exam: Heart:: Regular rate and rhythm Lungs: normal and clear to auscultation and percussion Abdomen: Distended without rebound or guarding although the patient is sedated   Lab Results: '@LABTEST2' @ Micro Results: Recent Results (from the past 240 hour(s))  Resp Panel by RT-PCR (Flu A&B, Covid) Nasopharyngeal Swab     Status: None   Collection Time: 10/10/21 10:50 AM   Specimen: Nasopharyngeal Swab; Nasopharyngeal(NP) swabs in vial transport medium  Result Value Ref Range Status   SARS Coronavirus 2 by RT PCR NEGATIVE NEGATIVE Final    Comment: (NOTE) SARS-CoV-2 target nucleic acids are NOT DETECTED.  The SARS-CoV-2 RNA is generally detectable in upper respiratory specimens during the acute phase of infection. The lowest concentration of SARS-CoV-2 viral copies this assay can detect is 138 copies/mL. A negative result does not preclude SARS-Cov-2 infection and should not be used as the sole basis for treatment or other patient management decisions. A negative result may occur with  improper specimen collection/handling, submission of specimen other than  nasopharyngeal swab, presence of viral mutation(s) within the areas targeted by this assay, and inadequate number of viral copies(<138 copies/mL). A negative result must be combined with clinical observations, patient history, and epidemiological information. The expected result is Negative.  Fact Sheet for Patients:  EntrepreneurPulse.com.au  Fact Sheet for Healthcare Providers:  IncredibleEmployment.be  This test is no t yet approved or cleared by the [place] FDA and  has been authorized for detection and/or diagnosis of SARS-CoV-2 by FDA under an Emergency Use Authorization (EUA). This EUA will remain  in effect (meaning this test can be used) for the duration of the COVID-19 declaration under Section 564(b)(1) of the Act, 21 U.S.C.section 360bbb-3(b)(1), unless the authorization is terminated  or revoked sooner.       Influenza A by PCR NEGATIVE NEGATIVE Final   Influenza B by PCR NEGATIVE NEGATIVE Final    Comment: (NOTE) The Xpert Xpress SARS-CoV-2/FLU/RSV plus assay is intended as an aid in the diagnosis of influenza from Nasopharyngeal swab specimens and should not be used as a sole basis for treatment. Nasal washings and aspirates are unacceptable for Xpert Xpress SARS-CoV-2/FLU/RSV testing.  Fact Sheet for Patients: EntrepreneurPulse.com.au  Fact Sheet for Healthcare Providers: IncredibleEmployment.be  This test is not yet approved or cleared by the [place] FDA and has been authorized for detection and/or diagnosis of SARS-CoV-2 by FDA under an Emergency Use Authorization (EUA). This EUA will remain in effect (meaning this test  can be used) for the duration of the COVID-19 declaration under Section 564(b)(1) of the Act, 21 U.S.C. section 360bbb-3(b)(1), unless the authorization is terminated or revoked.  Performed at [facility], [address] [place], [place]  [address]   MRSA Next Gen by PCR, Nasal     Status: None   Collection Time: 10/10/21  2:47 PM   Specimen: Nasal Mucosa; Nasal Swab  Result Value Ref Range Status   MRSA by PCR Next Gen NOT DETECTED NOT DETECTED Final    Comment: (NOTE) The GeneXpert MRSA Assay (FDA approved for NASAL specimens only), is one component of a comprehensive MRSA colonization surveillance program. It is not intended to diagnose MRSA infection nor to guide or monitor treatment for MRSA infections. Test performance is not FDA approved in patients less than 54 years old. Performed at [facility], [address] [place], [place] [address]    Studies/Results: CT HEAD WO CONTRAST (5MM)  Result Date: 10/10/2021 CLINICAL DATA:  Cerebral edema Hyperammonemia. EXAM: CT HEAD WITHOUT CONTRAST TECHNIQUE: Contiguous axial images were obtained from the base of the skull through the vertex without intravenous contrast. COMPARISON:  None. FINDINGS: Brain: There is no mass, hemorrhage or extra-axial collection. The size and configuration of the ventricles and extra-axial CSF spaces are normal. The brain parenchyma is normal, without acute or chronic infarction. Vascular: No abnormal hyperdensity of the major intracranial arteries or dural venous sinuses. No intracranial atherosclerosis. Skull: The visualized skull base, calvarium and extracranial soft tissues are normal. Sinuses/Orbits: No fluid levels or advanced mucosal thickening of the visualized paranasal sinuses. No mastoid or middle ear effusion. The orbits are normal. IMPRESSION: Normal head CT. Electronically Signed   By: Ulyses Jarred M.D.   On: 10/10/2021 18:54   DG Chest Port 1 View  Result Date: 10/10/2021 CLINICAL DATA:  Check central line placement EXAM: PORTABLE CHEST 1 VIEW COMPARISON:  Film from earlier in the same day. FINDINGS: New left jugular central line is noted with catheter tip at the cavoatrial junction. No pneumothorax is noted. Cardiac shadow  is stable. Endotracheal tube is. Lungs demonstrate some patchy atelectatic changes bilaterally. No bony abnormality is noted. IMPRESSION: No pneumothorax following central line placement. Electronically Signed   By: Inez Catalina M.D.   On: 10/10/2021 23:56   DG Chest Port 1 View  Result Date: 10/10/2021 CLINICAL DATA:  Intubation EXAM: PORTABLE CHEST 1 VIEW COMPARISON:  Chest radiograph 04/12/2021 FINDINGS: The endotracheal tube tip is approximately 3.2 cm from the carina. The cardiomediastinal silhouette is stable. Lung volumes are low, with unchanged slight asymmetric elevation of the right hemidiaphragm. Patchy opacities in the left base may reflect infection or aspiration. There is no other focal consolidation. Linear opacities in the right midlung may reflect atelectasis or scar. There is no pulmonary edema. There is no pleural effusion or pneumothorax. There is no acute osseous abnormality. IMPRESSION: 1. Endotracheal tube in satisfactory position. 2. Patchy opacities in the left base could reflect aspiration or infection. Electronically Signed   By: Valetta Mole M.D.   On: 10/10/2021 18:54   Medications: I have reviewed the patient's current medications. Scheduled Meds:  chlorhexidine gluconate (MEDLINE KIT)  15 mL Mouth Rinse BID   Chlorhexidine Gluconate Cloth  6 each Topical Daily   insulin aspart  0-15 Units Subcutaneous Q4H   lactulose  300 mL Rectal Q6H   mouth rinse  15 mL Mouth Rinse 10 times per day   Continuous Infusions:  sodium chloride 250 mL (  10/11/21 0006)   cefTRIAXone (ROCEPHIN)  IV Stopped (10/10/21 1832)   norepinephrine (LEVOPHED) Adult infusion 4 mcg/min (10/11/21 0505)   octreotide  (SANDOSTATIN)    IV infusion 50 mcg/hr (10/11/21 1213)   pantoprazole 8 mg/hr (10/11/21 0936)   propofol (DIPRIVAN) infusion 25 mcg/kg/min (10/11/21 0649)   PRN Meds:.docusate sodium, fentaNYL (SUBLIMAZE) injection, midazolam, ondansetron (ZOFRAN) IV, polyethylene  glycol   Assessment: Principal Problem:   Hematemesis Active Problems:   Secondary esophageal varices without bleeding (HCC)   Secondary esophageal varices with bleeding ([place])    Plan: This patient has alcoholic cirrhosis with esophageal varices that were banded yesterday due to hematemesis.  The patient's hemoglobin has been stable.  The patient's prognosis remains guarded.  Supportive care is recommended from this point on.  If he survives this hospital admission he should have repeat banding in 4 weeks to eradicate any remaining esophageal varices.   LOS: 1 day   Lewayne Bunting 10/11/2021, 12:36 PM Pager [phone] 7am-5pm  Check AMION for 5pm -7am coverage and on weekends

## 2021-10-12 LAB — PHOSPHORUS: Phosphorus: 4.4 mg/dL (ref 2.5–4.6)

## 2021-10-12 LAB — GLUCOSE, CAPILLARY
Glucose-Capillary: 92 mg/dL (ref 70–99)
Glucose-Capillary: 91 mg/dL (ref 70–99)
Glucose-Capillary: 93 mg/dL (ref 70–99)
Glucose-Capillary: 90 mg/dL (ref 70–99)
Glucose-Capillary: 94 mg/dL (ref 70–99)
Glucose-Capillary: 95 mg/dL (ref 70–99)
Glucose-Capillary: 93 mg/dL (ref 70–99)

## 2021-10-12 LAB — COMPREHENSIVE METABOLIC PANEL
ALT: 20 U/L (ref 0–44)
AST: 32 U/L (ref 15–41)
Albumin: 2.3 g/dL — ABNORMAL LOW (ref 3.5–5.0)
Alkaline Phosphatase: 69 U/L (ref 38–126)
Anion gap: 5 (ref 5–15)
BUN: 42 mg/dL — ABNORMAL HIGH (ref 6–20)
CO2: 18 mmol/L — ABNORMAL LOW (ref 22–32)
Calcium: 7.9 mg/dL — ABNORMAL LOW (ref 8.9–10.3)
Chloride: 114 mmol/L — ABNORMAL HIGH (ref 98–111)
Creatinine, Ser: 1.7 mg/dL — ABNORMAL HIGH (ref 0.61–1.24)
GFR, Estimated: 48 mL/min — ABNORMAL LOW (ref >60)
Glucose, Bld: 96 mg/dL (ref 70–99)
Potassium: 4.3 mmol/L (ref 3.5–5.1)
Sodium: 137 mmol/L (ref 135–145)
Total Bilirubin: 2.2 mg/dL — ABNORMAL HIGH (ref 0.3–1.2)
Total Protein: 5.8 g/dL — ABNORMAL LOW (ref 6.5–8.1)

## 2021-10-12 LAB — CBC
HCT: 26.2 % — ABNORMAL LOW (ref 39.0–52.0)
Hemoglobin: 9.2 g/dL — ABNORMAL LOW (ref 13.0–17.0)
MCH: 36.5 pg — ABNORMAL HIGH (ref 26.0–34.0)
MCHC: 35.1 g/dL (ref 30.0–36.0)
MCV: 104 fL — ABNORMAL HIGH (ref 80.0–100.0)
Platelets: 108 10*3/uL — ABNORMAL LOW (ref 150–400)
RBC: 2.52 MIL/uL — ABNORMAL LOW (ref 4.22–5.81)
RDW: 14.2 % (ref 11.5–15.5)
WBC: 6.4 10*3/uL (ref 4.0–10.5)
nRBC: 0 % (ref 0.0–0.2)

## 2021-10-12 LAB — PROTIME-INR
INR: 2.3 — ABNORMAL HIGH (ref 0.8–1.2)
Prothrombin Time: 25.7 seconds — ABNORMAL HIGH (ref 11.4–15.2)

## 2021-10-12 LAB — HCV INTERPRETATION

## 2021-10-12 LAB — MAGNESIUM: Magnesium: 1.8 mg/dL (ref 1.7–2.4)

## 2021-10-12 LAB — HCV AB W REFLEX TO QUANT PCR: HCV Ab: <0.1 s/co ratio (ref 0.0–0.9)

## 2021-10-12 MED ORDER — DEXMEDETOMIDINE HCL IN NACL 400 MCG/100ML IV SOLN
0.4000 ug/kg/h | INTRAVENOUS | Status: DC
  Administered 2021-10-12: 0.45 ug/kg/h via INTRAVENOUS
  Filled 2021-10-12: qty 100

## 2021-10-12 NOTE — Progress Notes (Signed)
[name], MD [facility]   [address] [place],  [address] Phone: [phone] Fax : [phone]   Subjective: The patient is moving around with spontaneous movements despite being intubated and on propofol.  His blood pressure is better today.  There is no sign of any further bleeding and his hemoglobin has been stable.  The patient had bands placed on his esophageal varices.   Objective: Vital signs in last 24 hours: Vitals:   10/12/21 1000 10/12/21 1100 10/12/21 1121 10/12/21 1200  BP: 103/69 103/66  106/67  Pulse: 93 93  100  Resp: _0 Temp:      TempSrc:      SpO2: 97% 96% 100% 97%  Weight:      Height:       Weight change: 5.633 kg  Intake/Output Summary (Last 24 hours) at 10/12/2021 1258 Last data filed at 10/12/2021 0600 Gross per 24 hour  Intake 1321.29 ml  Output 1500 ml  Net -178.71 ml     Exam: Heart:: Regular rate and rhythm, S1S2 present, or without murmur or extra heart sounds Lungs: normal and clear to auscultation and percussion Abdomen: soft, nontender, normal bowel sounds   Lab Results: _1 @ Micro Results: Recent Results (from the past 240 hour(s))  Resp Panel by RT-PCR (Flu A&B, Covid) Nasopharyngeal Swab     Status: None   Collection Time: 10/10/21 10:50 AM   Specimen: Nasopharyngeal Swab; Nasopharyngeal(NP) swabs in vial transport medium  Result Value Ref Range Status   SARS Coronavirus 2 by RT PCR NEGATIVE NEGATIVE Final    Comment: (NOTE) SARS-CoV-2 target nucleic acids are NOT DETECTED.  The SARS-CoV-2 RNA is generally detectable in upper respiratory specimens during the acute phase of infection. The lowest concentration of SARS-CoV-2 viral copies this assay can detect is 138 copies/mL. A negative result does not preclude SARS-Cov-2 infection and should not be used as the sole basis for treatment or other patient management decisions. A negative result may occur with  improper specimen  collection/handling, submission of specimen other than nasopharyngeal swab, presence of viral mutation(s) within the areas targeted by this assay, and inadequate number of viral copies(<138 copies/mL). A negative result must be combined with clinical observations, patient history, and epidemiological information. The expected result is Negative.  Fact Sheet for Patients:  EntrepreneurPulse.com.au  Fact Sheet for Healthcare Providers:  IncredibleEmployment.be  This test is no t yet approved or cleared by the [place] FDA and  has been authorized for detection and/or diagnosis of SARS-CoV-2 by FDA under an Emergency Use Authorization (EUA). This EUA will remain  in effect (meaning this test can be used) for the duration of the COVID-19 declaration under Section 564(b)(1) of the Act, 21 U.S.C.section 360bbb-3(b)(1), unless the authorization is terminated  or revoked sooner.       Influenza A by PCR NEGATIVE NEGATIVE Final   Influenza B by PCR NEGATIVE NEGATIVE Final    Comment: (NOTE) The Xpert Xpress SARS-CoV-2/FLU/RSV plus assay is intended as an aid in the diagnosis of influenza from Nasopharyngeal swab specimens and should not be used as a sole basis for treatment. Nasal washings and aspirates are unacceptable for Xpert Xpress SARS-CoV-2/FLU/RSV testing.  Fact Sheet for Patients: EntrepreneurPulse.com.au  Fact Sheet for Healthcare Providers: IncredibleEmployment.be  This test is not yet approved or cleared by the [place] FDA and has been authorized for detection and/or diagnosis of SARS-CoV-2 by FDA under an Emergency Use Authorization (EUA). This EUA will remain in effect (  meaning this test can be used) for the duration of the COVID-19 declaration under Section 564(b)(1) of the Act, 21 U.S.C. section 360bbb-3(b)(1), unless the authorization is terminated or revoked.  Performed at [facility], [address] [place], [place] [address]   MRSA Next Gen by PCR, Nasal     Status: None   Collection Time: 10/10/21  2:47 PM   Specimen: Nasal Mucosa; Nasal Swab  Result Value Ref Range Status   MRSA by PCR Next Gen NOT DETECTED NOT DETECTED Final    Comment: (NOTE) The GeneXpert MRSA Assay (FDA approved for NASAL specimens only), is one component of a comprehensive MRSA colonization surveillance program. It is not intended to diagnose MRSA infection nor to guide or monitor treatment for MRSA infections. Test performance is not FDA approved in patients less than 20 years old. Performed at [facility], [address], [place],  [address]    Studies/Results: CT HEAD WO CONTRAST (5MM)  Result Date: 10/10/2021 CLINICAL DATA:  Cerebral edema Hyperammonemia. EXAM: CT HEAD WITHOUT CONTRAST TECHNIQUE: Contiguous axial images were obtained from the base of the skull through the vertex without intravenous contrast. COMPARISON:  None. FINDINGS: Brain: There is no mass, hemorrhage or extra-axial collection. The size and configuration of the ventricles and extra-axial CSF spaces are normal. The brain parenchyma is normal, without acute or chronic infarction. Vascular: No abnormal hyperdensity of the major intracranial arteries or dural venous sinuses. No intracranial atherosclerosis. Skull: The visualized skull base, calvarium and extracranial soft tissues are normal. Sinuses/Orbits: No fluid levels or advanced mucosal thickening of the visualized paranasal sinuses. No mastoid or middle ear effusion. The orbits are normal. IMPRESSION: Normal head CT. Electronically Signed   By: Ulyses Jarred M.D.   On: 10/10/2021 18:54   DG Chest Port 1 View  Result Date: 10/10/2021 CLINICAL DATA:  Check central line placement EXAM: PORTABLE CHEST 1 VIEW COMPARISON:  Film from earlier in the same day. FINDINGS: New left jugular central line is noted with catheter tip at the cavoatrial  junction. No pneumothorax is noted. Cardiac shadow is stable. Endotracheal tube is. Lungs demonstrate some patchy atelectatic changes bilaterally. No bony abnormality is noted. IMPRESSION: No pneumothorax following central line placement. Electronically Signed   By: Inez Catalina M.D.   On: 10/10/2021 23:56   DG Chest Port 1 View  Result Date: 10/10/2021 CLINICAL DATA:  Intubation EXAM: PORTABLE CHEST 1 VIEW COMPARISON:  Chest radiograph 04/12/2021 FINDINGS: The endotracheal tube tip is approximately 3.2 cm from the carina. The cardiomediastinal silhouette is stable. Lung volumes are low, with unchanged slight asymmetric elevation of the right hemidiaphragm. Patchy opacities in the left base may reflect infection or aspiration. There is no other focal consolidation. Linear opacities in the right midlung may reflect atelectasis or scar. There is no pulmonary edema. There is no pleural effusion or pneumothorax. There is no acute osseous abnormality. IMPRESSION: 1. Endotracheal tube in satisfactory position. 2. Patchy opacities in the left base could reflect aspiration or infection. Electronically Signed   By: Valetta Mole M.D.   On: 10/10/2021 18:54   Medications: I have reviewed the patient's current medications. Scheduled Meds:  chlorhexidine gluconate (MEDLINE KIT)  15 mL Mouth Rinse BID   Chlorhexidine Gluconate Cloth  6 each Topical Daily   insulin aspart  0-15 Units Subcutaneous Q4H   lactulose  300 mL Rectal Q6H   mouth rinse  15 mL Mouth Rinse 10 times per day   Continuous Infusions:  sodium  chloride 10 mL/hr at 10/12/21 0149   cefTRIAXone (ROCEPHIN)  IV Stopped (10/11/21 1624)   norepinephrine (LEVOPHED) Adult infusion Stopped (10/12/21 0056)   octreotide  (SANDOSTATIN)    IV infusion 50 mcg/hr (10/12/21 0845)   pantoprazole 8 mg/hr (10/12/21 2952)   propofol (DIPRIVAN) infusion Stopped (10/12/21 0804)   PRN Meds:.docusate sodium, fentaNYL (SUBLIMAZE) injection, midazolam, ondansetron  (ZOFRAN) IV, polyethylene glycol   Assessment: Principal Problem:   Hematemesis Active Problems:   Secondary esophageal 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  The patient is still gravely ill and in guarded condition.  Continue with supportive care at this time.  I have also made it quite clear to the patient's family that he has to be compliant with his medications if he wants to continue to stay out of the hospital.   LOS: 2 days   Lewayne Bunting 10/12/2021, 12:58 PM Pager [phone] 7am-5pm  Check AMION for 5pm -7am coverage and on weekends

## 2021-10-12 NOTE — TOC Initial Note (Signed)
Transition of Care [facility]) - Initial/Assessment Note    Patient Details  Name: Shawn Torres MRN: [id number] Date of Birth: 1965-11-25  Transition of Care [facility]) CM/SW Contact:    Shelbie Hutching, RN Phone Number: 10/12/2021, 12:15 PM  Clinical Narrative:                 Patient admitted to the hospital with hepatic encephalopathy and esophageal varices with bleeding s/p upper GI with banding.  Patient is in the ICU intubated.  Patient's daughter, Shawn Torres, and son, Shawn Torres, are at the bedside.  RNCM met at the bedside with family.   Patient lives at home with his significant other, he is independent at home, he does not currently work, daughter provides transportation.  Per daughter patient has been going to [facility] for paracentesis as needed and following with GI, from [facility] notes patient is waiting to get set up with [facility], he is scheduled with GI on 11/19/21.   Patient is on 2 medications lactulose and lasix, has been getting them from [facility].  Last time daughter went to get medications from [facility] it was really expensive.  Daughter made aware of [facility] and that his discharge prescriptions could also be gotten from [facility].   Daughter reports that if patient needs someone to stay with him once he is discharged she will move in with him.   TOC will follow.   Expected Discharge Plan: Home/Self Care Barriers to Discharge: Continued Medical Work up   Patient Goals and CMS Choice Patient states their goals for this hospitalization and ongoing recovery are:: Patient currently intubated sedation off - family at the bedside      Expected Discharge Plan and Services Expected Discharge Plan: Home/Self Care In-house Referral: Interpreting Services, Development worker, community Discharge Planning Services: CM Consult, Medication Assistance   Living arrangements for the past 2 months: Mobile Home                 DME Arranged: N/A DME Agency: NA       HH Arranged:  NA          Prior Living Arrangements/Services Living arrangements for the past 2 months: Mobile Home Lives with:: Significant Other Patient language and need for interpreter reviewed:: Yes Do you feel safe going back to the place where you live?: Yes      Need for Family Participation in Patient Care: Yes (Comment) (liver cirrhosis,, esophageal bleed- banded) Care giver support system in place?: Yes (comment) (daughter and son and girlfriend)   Criminal Activity/Legal Involvement Pertinent to Current Situation/Hospitalization: No - Comment as needed  Activities of Daily Living      Permission Sought/Granted Permission sought to share information with : Case Manager, Family Supports Permission granted to share information with : Yes, Verbal Permission Granted  Share Information with NAME: Dellis Filbert     Permission granted to share info w Relationship: daughter  Permission granted to share info w Contact Information: [phone]  Emotional Assessment Appearance:: Appears stated age Attitude/Demeanor/Rapport: Unable to Assess Affect (typically observed): Unable to Assess   Alcohol / Substance Use: Not Applicable Psych Involvement: No (comment)  Admission diagnosis:  Hepatic encephalopathy [K76.82] Hematemesis [K92.0] Secondary esophageal varices with bleeding (HCC) [I85.11] Patient Active Problem List   Diagnosis Date Noted   Secondary esophageal varices with bleeding ([place])    Hematemesis 10/10/2021   Hepatic encephalopathy    Secondary esophageal varices without bleeding ([place])    Hyponatremia 05/24/2019   Pneumonia  due to COVID-19 virus 05/24/2019   Transaminitis 05/24/2019   Alcohol dependence with uncomplicated withdrawal ([place]) 05/24/2019   Hypokalemia 05/24/2019   COVID-19 05/24/2019   PCP:  Pcp, No Pharmacy:   [id number] - [address], [place] [address] [place] [address] Phone:  [phone] Fax: [phone]     Social Determinants of Health (SDOH) Interventions    Readmission Risk Interventions No flowsheet data found.

## 2021-10-12 NOTE — Consult Note (Signed)
PHARMACY CONSULT NOTE - FOLLOW UP  Pharmacy Consult for Electrolyte Monitoring and Replacement   Recent Labs: Potassium (mmol/L)  Date Value  10/12/2021 4.3   Magnesium (mg/dL)  Date Value  06/77/0340 1.8   Calcium (mg/dL)  Date Value  35/24/8185 7.9 (L)   Albumin (g/dL)  Date Value  90/93/1121 2.3 (L)   Phosphorus (mg/dL)  Date Value  62/44/6950 4.4   Sodium (mmol/L)  Date Value  10/12/2021 137     Assessment: 55yo w/ h/o  decompensated alcoholic cirrhosis c/b portal hypertension and ascites presenting with encephalopathy ISO hyperammonemia & active hematemesis in the ED. Found to have AKI likely 2/2 hepatorenal s/o. Pharmacy consulted for mgmt of electrolytes.  Goal of Therapy:  Lytes WNL  Plan:  All lytes WNL, no repletion req'd at this time. Will CTM and adjust PRN. Labs ordered.  Martyn Malay, PharmD, [facility] Clinical Pharmacist 10/12/2021 11:31 AM

## 2021-10-12 NOTE — Progress Notes (Signed)
NAME:  Shawn Torres, MRN:  [id number], DOB:  14-Feb-1966, LOS: 2 ADMISSION DATE:  10/10/2021, CONSULTATION DATE:  10/12/21  REFERRING MD:  Ellender Hose, CHIEF COMPLAINT:  Hematemesis   History of Present Illness:  55 y.o. 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 Additional notable findings include AKI with creatinine of 1.68 (baseline 0.9 to 1.0), CO2 17, lactate 4.7, Hgb 11.5, INR 2.0. Hemodynamics stable in ED. GI consulted with plans for EGD.  10/11/21- patient is on MV , weaning of vasopressors today. Preparing for SBT if able to stabalize.  10/12/21- off vasopressos performing weaning trial today.   Pertinent  Medical History  Alcoholic cirrhosis with portal hypertension DM2  Significant Hospital Events: Including procedures, antibiotic start and stop dates in addition to other pertinent events   11/20: Admitted with hematemesis, encephalopathy  Interim History / Subjective:  N/A  Objective   Blood pressure 99/69, pulse 80, temperature 99.5 F (37.5 C), temperature source Axillary, resp. rate 14, height '5\' 5"'  (1.651 m), weight 82.2 kg, SpO2 98 %.    Vent Mode: PRVC FiO2 (%):  [24 %] 24 % Set Rate:  [15 bmp-16 bmp] 15 bmp Vt Set:  [450 mL] 450 mL PEEP:  [5 cmH20] 5 cmH20 Plateau Pressure:  [12 cmH20] 12 cmH20   Intake/Output Summary (Last 24 hours) at 10/12/2021 0815 Last data filed at 10/12/2021 0600 Gross per 24 hour  Intake 1321.29 ml  Output 1700 ml  Net -378.71 ml    Filed Weights   10/10/21 1007 10/11/21 0500 10/12/21 0500  Weight: 76.6 kg 82.1 kg 82.2 kg    Examination: General:age appropriate intubated and sedated HENT: scleral icterus, ETT in place Lungs: CTA b/l, no W/C/R Cardiovascular: RRR, no M/R/G Abdomen: soft, non-distended, no fluid wave, +BS Extremities:  no C/C/E Neuro: intubated and sedated; RASS -4 GCS 4T GU: deferred  Resolved Hospital Problem list   N/A  Assessment & Plan:  Hematemesis in patient with known cirrhosis, concern for variceal bleed Coagulopathy of liver disease - GI consult (Dr. Allen Norris) with plans for EGD and possible banding - Unclear if pt will be intubated on arrival to ICU; he will need intubation for EGD - Protonix infusion and octreotide x72h, ceftriaxone x5d - Hold VTE ppx - Ensure 2 large-bore PIV above A/C - Monitor H&H and hemodynamics - Will discuss IV vitamin K with GI given coagulopathy and active bleeding   Hepatic encephalopathy 2/2 hyperammonemia Decompensated alcoholic cirrhosis with ascites - GI following as above -  CT head -no bleed noted - Start lactulose enemas from below; if no varices on EGD, can place NGT for oral administration - Trend ammonia initially to ensure down-trend - Daily CMP, INR; address last alcohol use; may consider transfer to liver center   AKI, possible hepatorenal syndrome versus pre-renal - Abdomen not tense or distended, defer paracentesis - Reassess renal function after initial resuscitative measures - Will consider albumin for hepatorenal syndrome  Lactic acidosis - Elevation in setting of acute illness, cirrhosis - Trend to close  DM2 - CBGs, SSI - No basal bolus  Best Practice (right click and "Reselect all SmartList Selections" daily)   Diet/type: NPO DVT prophylaxis: SCD GI prophylaxis: PPI Lines: N/A Foley:  N/A Code Status:  full code Last date of multidisciplinary goals of care discussion [N/A]  I Assessed the need for  Labs I Assessed the need for Foley I Assessed the need for Central Venous Line Family Discussion when available I Assessed the need for Mobilization I made an Assessment of medications to be adjusted accordingly Safety Risk assessment completed  Labs   CBC: Recent Labs  Lab 10/11/21 0354 10/11/21 0930 10/11/21 1538  10/11/21 2206 10/12/21 0338  WBC 6.0 6.7 5.8 6.5 6.4  HGB 8.6* 9.6* 8.8* 8.8* 9.2*  HCT 24.8* 27.1* 25.6* 26.0* 26.2*  MCV 101.2* 103.4* 101.2* 102.4* 104.0*  PLT 110* 102* 106* 101* 108*     Basic Metabolic Panel: Recent Labs  Lab 10/10/21 1019 10/10/21 1622 10/11/21 0354 10/12/21 0338  NA 133*  --  134* 137  K 4.1  --  4.5 4.3  CL 106  --  109 114*  CO2 17*  --  19* 18*  GLUCOSE 170*  --  111* 96  BUN 38*  --  39* 42*  CREATININE 1.68*  --  1.62* 1.70*  CALCIUM 8.6*  --  8.1* 7.9*  MG  --  2.0 1.8 1.8  PHOS  --  4.1 4.3 4.4    GFR: Estimated Creatinine Clearance: 49.6 mL/min (A) (by C-G formula based on SCr of 1.7 mg/dL (H)). Recent Labs  Lab 10/10/21 1019 10/10/21 1454 10/10/21 1622 10/10/21 1858 10/10/21 2113 10/10/21 2308 10/11/21 0930 10/11/21 1538 10/11/21 2206 10/12/21 0338  WBC 7.1  --    < >  --  6.0   < > 6.7 5.8 6.5 6.4  LATICACIDVEN 4.7* 1.5  --  2.1* 1.7  --   --   --   --   --    < > = values in this interval not displayed.     Liver Function Tests: Recent Labs  Lab 10/10/21 1019 10/11/21 0354 10/12/21 0338  AST 55* 35 32  ALT '29 21 20  ' ALKPHOS 119 75 69  BILITOT 3.6* 2.9* 2.2*  PROT 8.2* 6.0* 5.8*  ALBUMIN 2.9* 2.1* 2.3*    No results for input(s): LIPASE, AMYLASE in the last 168 hours. Recent Labs  Lab 10/10/21 1019 10/11/21 0354  AMMONIA 230* 84*     ABG    Component Value Date/Time   PHART 7.37 10/10/2021 1605   PCO2ART 29 (L) 10/10/2021 1605   PO2ART 110 (H) 10/10/2021 1605   HCO3 17.3 (L) 10/11/2021 0500   ACIDBASEDEF 6.1 (H) 10/11/2021 0500   O2SAT 81.7 10/11/2021 0500      Coagulation Profile: Recent Labs  Lab 10/10/21 1019 10/11/21 0354 10/12/21 0338  INR 2.0* 2.4* 2.3*     Cardiac Enzymes: No results for input(s): CKTOTAL, CKMB, CKMBINDEX, TROPONINI in the last 168 hours.  HbA1C: Hgb A1c MFr Bld  Date/Time Value Ref Range Status  10/10/2021 04:22 PM 4.4 (L) 4.8 - 5.6 % Final    Comment:     (NOTE) Pre diabetes:          5.7%-6.4%  Diabetes:              >6.4%  Glycemic control for   <7.0% adults with diabetes   05/24/2019 06:47 AM 10.8 (H) 4.8 - 5.6 % Final    Comment:    (NOTE) Pre diabetes:          5.7%-6.4% Diabetes:              >6.4% Glycemic control for   <7.0% adults with diabetes     CBG: Recent Labs  Lab 10/11/21 1606 10/11/21 1943 10/11/21 2324  10/12/21 0335 10/12/21 0712  GLUCAP 90 88 89 92 91     Review of Systems:   Unable to assess due to patient's clinical status.  Past Medical History:  He,  has no past medical history on file.   Surgical History:   Past Surgical History:  Procedure Laterality Date   ESOPHAGOGASTRODUODENOSCOPY N/A 10/10/2021   Procedure: ESOPHAGOGASTRODUODENOSCOPY (EGD);  Surgeon: [name], MD;  Location: [facility] ENDOSCOPY;  Service: Endoscopy;  Laterality: N/A;     Social History:   reports that he has never smoked. He has never used smokeless tobacco. He reports current alcohol use. He reports that he does not use drugs.   Family History:  His Family history is unknown by patient.   Allergies No Known Allergies   Home Medications  Prior to Admission medications   Medication Sig Start Date End Date Taking? Authorizing Provider  blood glucose meter kit and supplies KIT Dispense based on patient and insurance preference. Check CBGs before each meal and before bedtime. Log in your results and show to your PCP in 1 week 05/30/19   Caren Griffins, MD  dexamethasone (DECADRON) 6 MG tablet Take 1 tablet (6 mg total) by mouth daily. 05/31/19   Caren Griffins, MD  furosemide (LASIX) 20 MG tablet Take 1 tablet (20 mg total) by mouth daily. 04/29/21 05/29/21  Rudene Re, MD  insulin glargine (LANTUS) 100 unit/mL SOPN Inject 0.1 mLs (10 Units total) into the skin daily. Please dispense 100 appropriate needles as well. 05/30/19   Caren Griffins, MD  metFORMIN (GLUCOPHAGE) 500 MG tablet Take 1 tablet (500 mg  total) by mouth 2 (two) times daily with a meal. 05/30/19 05/29/20  Gherghe, Vella Redhead, MD      CASE DISCUSSED IN MULTIDISCIPLINARY ROUNDS WITH ICU TEAM   Critical care provider statement:   Total critical care time: 33 minutes   Performed by: Lanney Gins MD   Critical care time was exclusive of separately billable procedures and treating other patients.   Critical care was necessary to treat or prevent imminent or life-threatening deterioration.   Critical care was time spent personally by me on the following activities: development of treatment plan with patient and/or surrogate as well as nursing, discussions with consultants, evaluation of patient's response to treatment, examination of patient, obtaining history from patient or surrogate, ordering and performing treatments and interventions, ordering and review of laboratory studies, ordering and review of radiographic studies, pulse oximetry and re-evaluation of patient's condition.    Ottie Glazier, M.D.  Pulmonary & Critical Care Medicine

## 2021-10-13 ENCOUNTER — Inpatient Hospital Stay: Payer: Medicaid Other

## 2021-10-13 LAB — CBC
HCT: 24.7 % — ABNORMAL LOW (ref 39.0–52.0)
Hemoglobin: 8.5 g/dL — ABNORMAL LOW (ref 13.0–17.0)
MCH: 35 pg — ABNORMAL HIGH (ref 26.0–34.0)
MCHC: 34.4 g/dL (ref 30.0–36.0)
MCV: 101.6 fL — ABNORMAL HIGH (ref 80.0–100.0)
Platelets: 91 10*3/uL — ABNORMAL LOW (ref 150–400)
RBC: 2.43 MIL/uL — ABNORMAL LOW (ref 4.22–5.81)
RDW: 14.2 % (ref 11.5–15.5)
WBC: 8.5 10*3/uL (ref 4.0–10.5)
nRBC: 0 % (ref 0.0–0.2)

## 2021-10-13 LAB — GLUCOSE, CAPILLARY
Glucose-Capillary: 96 mg/dL (ref 70–99)
Glucose-Capillary: 116 mg/dL — ABNORMAL HIGH (ref 70–99)
Glucose-Capillary: 112 mg/dL — ABNORMAL HIGH (ref 70–99)
Glucose-Capillary: 168 mg/dL — ABNORMAL HIGH (ref 70–99)
Glucose-Capillary: 249 mg/dL — ABNORMAL HIGH (ref 70–99)
Glucose-Capillary: 197 mg/dL — ABNORMAL HIGH (ref 70–99)

## 2021-10-13 LAB — COMPREHENSIVE METABOLIC PANEL
ALT: 18 U/L (ref 0–44)
AST: 25 U/L (ref 15–41)
Albumin: 2 g/dL — ABNORMAL LOW (ref 3.5–5.0)
Alkaline Phosphatase: 66 U/L (ref 38–126)
Anion gap: 4 — ABNORMAL LOW (ref 5–15)
BUN: 42 mg/dL — ABNORMAL HIGH (ref 6–20)
CO2: 18 mmol/L — ABNORMAL LOW (ref 22–32)
Calcium: 7.4 mg/dL — ABNORMAL LOW (ref 8.9–10.3)
Chloride: 113 mmol/L — ABNORMAL HIGH (ref 98–111)
Creatinine, Ser: 1.65 mg/dL — ABNORMAL HIGH (ref 0.61–1.24)
GFR, Estimated: 49 mL/min — ABNORMAL LOW (ref >60)
Glucose, Bld: 118 mg/dL — ABNORMAL HIGH (ref 70–99)
Potassium: 4.2 mmol/L (ref 3.5–5.1)
Sodium: 135 mmol/L (ref 135–145)
Total Bilirubin: 3.4 mg/dL — ABNORMAL HIGH (ref 0.3–1.2)
Total Protein: 5.4 g/dL — ABNORMAL LOW (ref 6.5–8.1)

## 2021-10-13 LAB — ALBUMIN, PLEURAL OR PERITONEAL FLUID: Albumin, Fluid: <1.5 g/dL

## 2021-10-13 LAB — BODY FLUID CELL COUNT WITH DIFFERENTIAL
Eos, Fluid: 0 %
Lymphs, Fluid: 62 %
Monocyte-Macrophage-Serous Fluid: 34 %
Neutrophil Count, Fluid: 4 %
Total Nucleated Cell Count, Fluid: 1328 cu mm

## 2021-10-13 LAB — PHOSPHORUS: Phosphorus: 3.8 mg/dL (ref 2.5–4.6)

## 2021-10-13 LAB — MAGNESIUM: Magnesium: 1.8 mg/dL (ref 1.7–2.4)

## 2021-10-13 LAB — PROTIME-INR
INR: 2.5 — ABNORMAL HIGH (ref 0.8–1.2)
Prothrombin Time: 26.7 seconds — ABNORMAL HIGH (ref 11.4–15.2)

## 2021-10-13 LAB — PATHOLOGIST SMEAR REVIEW

## 2021-10-13 LAB — AMMONIA: Ammonia: 148 umol/L — ABNORMAL HIGH (ref 9–35)

## 2021-10-13 MED ORDER — SODIUM CHLORIDE 0.9 % IV SOLN
2.0000 g | INTRAVENOUS | Status: AC
  Administered 2021-10-13 – 2021-10-14 (×2): 2 g via INTRAVENOUS
  Filled 2021-10-13 (×2): qty 2
  Filled 2021-10-13: qty 20

## 2021-10-13 MED ORDER — THIAMINE HCL 100 MG/ML IJ SOLN
100.0000 mg | Freq: Every day | INTRAMUSCULAR | Status: DC
  Administered 2021-10-13 – 2021-10-16 (×4): 100 mg via INTRAVENOUS
  Filled 2021-10-13 (×4): qty 2

## 2021-10-13 MED ORDER — NOREPINEPHRINE 4 MG/250ML-% IV SOLN
INTRAVENOUS | Status: AC
  Administered 2021-10-13: 2 ug/min
  Administered 2021-10-13: 2 ug/min via INTRAVENOUS
  Filled 2021-10-13: qty 250

## 2021-10-13 MED ORDER — ENSURE ENLIVE PO LIQD
237.0000 mL | Freq: Three times a day (TID) | ORAL | Status: DC
  Administered 2021-10-13 – 2021-10-15 (×4): 237 mL via ORAL

## 2021-10-13 MED ORDER — PANTOPRAZOLE INFUSION (NEW) - SIMPLE MED
8.0000 mg/h | INTRAVENOUS | Status: DC
Start: 2021-10-13 — End: 2021-10-16
  Administered 2021-10-13 – 2021-10-16 (×7): 8 mg/h via INTRAVENOUS
  Filled 2021-10-13 (×5): qty 100
  Filled 2021-10-13: qty 80

## 2021-10-13 MED ORDER — LACTULOSE 10 GM/15ML PO SOLN: 20.0000 g | Freq: Two times a day (BID) | ORAL | Status: DC | PRN

## 2021-10-13 MED ORDER — LACTULOSE 10 GM/15ML PO SOLN
20.0000 g | Freq: Three times a day (TID) | ORAL | Status: DC
  Administered 2021-10-13 – 2021-10-16 (×10): 20 g via ORAL
  Filled 2021-10-13 (×10): qty 30

## 2021-10-13 MED ORDER — SODIUM CHLORIDE 0.9 % IV SOLN
1.0000 mg | Freq: Once | INTRAVENOUS | Status: AC
  Administered 2021-10-13: 1 mg via INTRAVENOUS
  Filled 2021-10-13: qty 0.2

## 2021-10-13 MED ORDER — NOREPINEPHRINE 4 MG/250ML-% IV SOLN
0.0000 ug/min | INTRAVENOUS | Status: DC
Start: 2021-10-13 — End: 2021-10-15

## 2021-10-13 MED ORDER — ADULT MULTIVITAMIN W/MINERALS CH
1.0000 | ORAL_TABLET | Freq: Every day | ORAL | Status: DC
  Administered 2021-10-14 – 2021-10-16 (×3): 1 via ORAL
  Filled 2021-10-13 (×3): qty 1

## 2021-10-13 NOTE — Progress Notes (Signed)
Patient able to lift head off pillow and follow all simple commands. Patient extubated by Amy with cardiopulmonary.  Patient placed on 2 nasal cannula. Daughter at bedside and patient able to state name and date of birth.  Patient unsure of date, time and situation.  Patient asked in spanish if bilateral mitts could be removed.  Daughter at bedside and able to interpret patient needs.

## 2021-10-13 NOTE — Progress Notes (Signed)
Amy with cardiopulmonary placed patient in spontaneous mode for vent wean.

## 2021-10-13 NOTE — Evaluation (Signed)
Clinical/Bedside Swallow Evaluation Patient Details  Name: Shawn Torres MRN: [id number] Date of Birth: 1966/01/31  Today's Date: 10/13/2021 Time: SLP Start Time (ACUTE ONLY): 1450 SLP Stop Time (ACUTE ONLY): 1535 SLP Time Calculation (min) (ACUTE ONLY): 45 min  Past Medical History: No past medical history on file. Past Surgical History:  Past Surgical History:  Procedure Laterality Date   ESOPHAGOGASTRODUODENOSCOPY N/A 10/10/2021   Procedure: ESOPHAGOGASTRODUODENOSCOPY (EGD);  Surgeon: Midge Minium, MD;  Location: [facility] ENDOSCOPY;  Service: Endoscopy;  Laterality: N/A;   HPI:  55 y.o. male with h/o decompensated Alcoholic cirrhosis with portal hypertension and ascites presenting with encephalopathy in the setting of hyperammonemia. Moderate Malnutrition per Dietician.  Per family report, pt has been not taking his lactulose for the last several days. He has had progressively worsening altered mental status, with confusion, agitation. He has also begun having vomiting this AM, with bright red blood in his emesis x 2.  GI following; paracentesis completed.  Pt was intubated/extubated.  CXR: Low lung volumes with similar streaky bilateral opacities, most  likely atelectasis.    Assessment / Plan / Recommendation  Clinical Impression  Pt was seen for BSE w/ use of Spanish Interpreter Thayer Ohm, [phone]). Pt appears to present w/ adequate oropharyngeal phase swallow w/ No oropharyngeal phase dysphagia noted, No neuromuscular deficits noted. Pt mildly drowsy -- NSG reported elevated ammonia level, recent extubation. Pt consumed po trials w/ No overt, clinical s/s of aspiration during po trials. Pt appears at reduced risk for aspiration following general aspiration precautions.   During po trials, pt consumed all consistencies w/ no overt coughing, decline in vocal quality, or change in respiratory presentation during/post trials. He is on [place] 2L. Oral phase appeared [facility] w/ timely bolus management,  mastication, and control of bolus propulsion for A-P transfer for swallowing. Oral clearing achieved w/ all trial consistencies. OM Exam appeared [facility] w/ no unilateral weakness noted. Speech Clear, low volume. Pt fed self w/ setup support -- insisted in wanting to feed himself though d/t weakness, he needed min support.   Recommend a Regular consistency diet w/ well-Cut meats, moistened foods; Thin liquids VIA CUP currently while in hospital. Recommend general aspiration precautions, Pills WHOLE in Puree for safer, easier swallowing at this time. Education given on Pills in Puree; food consistencies and easy to eat options; general aspiration precautions. NSG to reconsult if any new needs arise. NSG agreed SLP Visit Diagnosis: Dysphagia, unspecified (R13.10)    Aspiration Risk   (reduced following general aspiration precautions)    Diet Recommendation   a Regular consistency diet (when diet can advance per GI/MD) w/ well-Cut meats, moistened foods; Thin liquids VIA CUP - less straw use at this time. Recommend general aspiration precautions, REFLUX precautions  Medication Administration: Whole meds with puree (for easier swallowing)    Other  Recommendations Recommended Consults:  (Dietician following) Oral Care Recommendations: Oral care BID;Patient independent with oral care;Staff/trained caregiver to provide oral care (support) Other Recommendations:  (n/a)    Recommendations for follow up therapy are one component of a multi-disciplinary discharge planning process, led by the attending physician.  Recommendations may be updated based on patient status, additional functional criteria and insurance authorization.  Follow up Recommendations No SLP follow up      Assistance Recommended at Discharge None  Functional Status Assessment Patient has had a recent decline in their functional status and demonstrates the ability to make significant improvements in function in a reasonable and predictable  amount of time.  Frequency and  Duration  (n/a)   (n/a)       Prognosis Prognosis for Safe Diet Advancement: Good Barriers to Reach Goals:  (n/a)      Swallow Study   General Date of Onset: 10/10/21 HPI: 55 y.o. male with h/o decompensated Alcoholic cirrhosis with portal hypertension and ascites presenting with encephalopathy in the setting of hyperammonemia. Moderate Malnutrition per Dietician.  Per family report, pt has been not taking his lactulose for the last several days. He has had progressively worsening altered mental status, with confusion, agitation. He has also begun having vomiting this AM, with bright red blood in his emesis x 2.  GI following; paracentesis completed.  Pt was intubated/extubated.  CXR: Low lung volumes with similar streaky bilateral opacities, most  likely atelectasis. Type of Study: Bedside Swallow Evaluation Previous Swallow Assessment: none Diet Prior to this Study: NPO Temperature Spikes Noted: No (wbc 8.5) Respiratory Status: Nasal cannula (2L) History of Recent Intubation: Yes Length of Intubations (days): 2 days Date extubated: 10/13/21 Behavior/Cognition: Alert;Cooperative;Pleasant mood;Distractible;Requires cueing (amonia level elevated per NSG) Oral Cavity Assessment: Within Functional Limits Oral Care Completed by SLP: Yes Oral Cavity - Dentition: Adequate natural dentition Vision: Functional for self-feeding Self-Feeding Abilities: Able to feed self;Needs assist;Needs set up (weak) Patient Positioning: Upright in bed (needed min positioning) Baseline Vocal Quality: Normal;Low vocal intensity Volitional Cough: Strong Volitional Swallow: Able to elicit    Oral/Motor/Sensory Function Overall Oral Motor/Sensory Function: Within functional limits   Ice Chips Ice chips: Within functional limits Presentation: Spoon (fed; 2 trials)   Thin Liquid Thin Liquid: Within functional limits Presentation: Cup;Self Fed (10 trials)    Nectar Thick Nectar  Thick Liquid: Not tested   Honey Thick Honey Thick Liquid: Not tested   Puree Puree: Within functional limits Presentation: Spoon;Self Fed (supported; 6 trials)   Solid     Solid: Within functional limits Presentation: Self Fed;Spoon (supported; 5 trials)        Jerilynn Som, MS, [employer] Speech Language Pathologist [facility] [phone] Tifanny Dollens 10/13/2021,4:25 PM

## 2021-10-13 NOTE — Consult Note (Signed)
PHARMACY CONSULT NOTE - FOLLOW UP  Pharmacy Consult for Electrolyte Monitoring and Replacement   Recent Labs: Potassium (mmol/L)  Date Value  10/13/2021 4.2   Magnesium (mg/dL)  Date Value  40/08/2724 1.8   Calcium (mg/dL)  Date Value  36/64/4034 7.4 (L)   Albumin (g/dL)  Date Value  74/25/9563 2.0 (L)   Phosphorus (mg/dL)  Date Value  87/56/4332 3.8   Sodium (mmol/L)  Date Value  10/13/2021 135     Assessment: 55yo w/ h/o  decompensated alcoholic cirrhosis c/b portal hypertension and ascites presenting with encephalopathy ISO hyperammonemia & active hematemesis in the ED. Found to have AKI likely 2/2 hepatorenal s/o. Pharmacy consulted for mgmt of electrolytes.  Goal of Therapy:  Lytes WNL  Plan:  All lytes WNL, no repletion req'd at this time. Will CTM and adjust PRN. Labs ordered.  Martyn Malay, PharmD, [facility] Clinical Pharmacist 10/13/2021 9:56 AM

## 2021-10-13 NOTE — Progress Notes (Signed)
Pt was suctioned prior to extubation for a small amount of white secretions. He was extubated and placed on a 2 L nasal cannula. He is voicing and is without stridor. His Sa02 is 98%.

## 2021-10-13 NOTE — Progress Notes (Signed)
Nutrition Follow Up Note   DOCUMENTATION CODES:   Non-severe (moderate) malnutrition in context of chronic illness  INTERVENTION:   RD will add supplements once pt's diet is advanced.   Pt at high refeed risk; recommend monitor potassium, magnesium and phosphorus labs daily until stable  NUTRITION DIAGNOSIS:   Moderate Malnutrition related to chronic illness (cirrhosis, etoh abuse) as evidenced by moderate fat depletion, moderate muscle depletion.  GOAL:   Patient will meet greater than or equal to 90% of their needs -not met   MONITOR:   Diet advancement, Labs, Weight trends, Skin, I & O's  ASSESSMENT:   55 y.o. male with h/o decompensated alcoholic cirrhosis with portal hypertension and ascites who is admitted with encephalopathy in the setting of hyperammonemia, hematemesis and AKI now s/p EGD 11/20 with banding of esophageal varices.  Pt extubated today. Pt awake and asking for food. Plan is for SLP evaluation today. RD will add supplements once pt's diet is advanced. Pt is at high refeed risk. Plan is for paracentesis. Per chart, pt up ~9lbs since admit; pt +62.67m on his I & Os.   Medications reviewed and include: insulin, lactulose, thiamine, ceftriaxone, folic acid, octreotide   Labs reviewed: K 4.2 wnl, BUN 42(H), creat 1.65(H), P 3.8 wnl, Mg 1.8 wnl Ammonia- 148(H) Hgb 8.5(L), Hct 24.7(L) cbgs- 116, 96 x 24 hrs  Diet Order:   Diet Order             Diet NPO time specified  Diet effective now                  EDUCATION NEEDS:   No education needs have been identified at this time  Skin:  Skin Assessment: Reviewed RN Assessment (ecchymosis)  Last BM:  11/23- 8067mvia rectal tube  Height:   Ht Readings from Last 1 Encounters:  10/11/21 _0  (1.651 m)    Weight:   Wt Readings from Last 1 Encounters:  10/13/21 80.7 kg    Ideal Body Weight:  61.8 kg  BMI:  Body mass index is 29.61 kg/m.  Estimated Nutritional Needs:   Kcal:   2000-2300kcal/day  Protein:  100-115g/day  Fluid:  1.5-1.8L/day  CaKoleen DistanceS, RD, LDN Please refer to AML[place] RD and/or RD on-call/weekend/after hours pager

## 2021-10-13 NOTE — Procedures (Signed)
PROCEDURE SUMMARY:  Successful US guided paracentesis from RLQ.  Yielded 5 Liters of clear yellow fluid.  No immediate complications.  Pt tolerated well.   Specimen was sent for labs.  EBL < 17mL  Cloretta Ned 10/13/2021 1:51 PM

## 2021-10-13 NOTE — Progress Notes (Signed)
NAME:  Shawn Torres, MRN:  [id number], DOB:  06-13-1966, LOS: 3 ADMISSION DATE:  10/10/2021, CONSULTATION DATE:  10/13/21  REFERRING MD:  Ellender Hose, CHIEF COMPLAINT:  Hematemesis   History of Present Illness:  55 y.o. male with h/o decompensated alcoholic cirrhosis with portal hypertension and ascites presenting with encephalopathy in the setting of hyperammonemia (NH3 230 on arrival, previously 48 on 09/19/21), noted to have active hematemesis in the ED. Additional notable findings include AKI with creatinine of 1.68 (baseline 0.9 to 1.0), CO2 17, lactate 4.7, Hgb 11.5, INR 2.0. Hemodynamics stable in ED. GI consulted with plans for EGD.  10/11/21- patient is on MV , weaning of vasopressors today. Preparing for SBT if able to stabalize.  10/12/21- off vasopressos performing weaning trial today.  10/13/21- extubated, had SLP passed now eating, family came for visit  Pertinent  Medical History  Alcoholic cirrhosis with portal hypertension DM2  Significant Hospital Events: Including procedures, antibiotic start and stop dates in addition to other pertinent events   11/20: Admitted with hematemesis, encephalopathy  Interim History / Subjective:  N/A  Objective   Blood pressure 94/65, pulse 72, temperature 99.6 F (37.6 C), temperature source Oral, resp. rate 14, height _0  (1.651 m), weight 80.7 kg, SpO2 98 %.    Vent Mode: PRVC FiO2 (%):  [24 %] 24 % Set Rate:  [15 bmp] 15 bmp Vt Set:  [450 mL] 450 mL PEEP:  [5 cmH20] 5 cmH20 Pressure Support:  [5 cmH20-8 cmH20] 5 cmH20 Plateau Pressure:  [12 cmH20] 12 cmH20   Intake/Output Summary (Last 24 hours) at 10/13/2021 1014 Last data filed at 10/13/2021 0900 Gross per 24 hour  Intake 2579.37 ml  Output 1925 ml  Net 654.37 ml    Filed Weights   10/11/21 0500 10/12/21 0500 10/13/21 0440  Weight: 82.1 kg 82.2 kg 80.7 kg    Examination: General:age appropriate intubated and sedated HENT: scleral icterus, ETT in place Lungs: CTA b/l,  no W/C/R Cardiovascular: RRR, no M/R/G Abdomen: soft, non-distended, no fluid wave, +BS Extremities: no C/C/E Neuro: intubated and sedated; RASS -4 GCS 4T GU: deferred  Resolved Hospital Problem list   N/A  Assessment & Plan:  Hematemesis in patient with known cirrhosis, concern for variceal bleed Coagulopathy of liver disease - GI consult (Dr. Allen Norris) with plans for EGD and possible banding - s/p protonix and octreotide with antibiotics   Large abdominal ascites   -s/p paracetnesis     - workup for SBP in progress - patient feels much improved    Hepatic encephalopathy 2/2 hyperammonemia Decompensated alcoholic cirrhosis with ascites - GI following as above -  CT head -no bleed noted - Start lactulose enemas from below; if no varices on EGD, can place NGT for oral administration - Trend ammonia initially to ensure down-trend - Daily CMP, INR; address last alcohol use; may consider transfer to liver center   AKI, possible hepatorenal syndrome versus pre-renal - Abdomen not tense or distended, defer paracentesis - Reassess renal function after initial resuscitative measures - Will consider albumin for hepatorenal syndrome  Lactic acidosis - Elevation in setting of acute illness, cirrhosis - Trend to close  DM2 - CBGs, SSI - No basal bolus  Best Practice (right click and "Reselect all SmartList Selections" daily)   Diet/type: NPO DVT prophylaxis: SCD GI prophylaxis: PPI Lines: N/A Foley:  N/A Code Status:  full code Last date of multidisciplinary goals of care discussion [N/A]  I Assessed the need for Labs I Assessed the  need for Foley I Assessed the need for Central Venous Line Family Discussion when available I Assessed the need for Mobilization I made an Assessment of medications to be adjusted accordingly Safety Risk assessment completed  Labs   CBC: Recent Labs  Lab 10/11/21 0930 10/11/21 1538 10/11/21 2206 10/12/21 0338 10/13/21 0442  WBC 6.7  5.8 6.5 6.4 8.5  HGB 9.6* 8.8* 8.8* 9.2* 8.5*  HCT 27.1* 25.6* 26.0* 26.2* 24.7*  MCV 103.4* 101.2* 102.4* 104.0* 101.6*  PLT 102* 106* 101* 108* 91*     Basic Metabolic Panel: Recent Labs  Lab 10/10/21 1019 10/10/21 1622 10/11/21 0354 10/12/21 0338 10/13/21 0442  NA 133*  --  134* 137 135  K 4.1  --  4.5 4.3 4.2  CL 106  --  109 114* 113*  CO2 17*  --  19* 18* 18*  GLUCOSE 170*  --  111* 96 118*  BUN 38*  --  39* 42* 42*  CREATININE 1.68*  --  1.62* 1.70* 1.65*  CALCIUM 8.6*  --  8.1* 7.9* 7.4*  MG  --  2.0 1.8 1.8 1.8  PHOS  --  4.1 4.3 4.4 3.8    GFR: Estimated Creatinine Clearance: 50.7 mL/min (A) (by C-G formula based on SCr of 1.65 mg/dL (H)). Recent Labs  Lab 10/10/21 1019 10/10/21 1454 10/10/21 1622 10/10/21 1858 10/10/21 2113 10/10/21 2308 10/11/21 1538 10/11/21 2206 10/12/21 0338 10/13/21 0442  WBC 7.1  --    < >  --  6.0   < > 5.8 6.5 6.4 8.5  LATICACIDVEN 4.7* 1.5  --  2.1* 1.7  --   --   --   --   --    < > = values in this interval not displayed.     Liver Function Tests: Recent Labs  Lab 10/10/21 1019 10/11/21 0354 10/12/21 0338 10/13/21 0442  AST 55* 35 32 25  ALT _0 ALKPHOS 119 75 69 66  BILITOT 3.6* 2.9* 2.2* 3.4*  PROT 8.2* 6.0* 5.8* 5.4*  ALBUMIN 2.9* 2.1* 2.3* 2.0*    No results for input(s): LIPASE, AMYLASE in the last 168 hours. Recent Labs  Lab 10/10/21 1019 10/11/21 0354 10/13/21 0442  AMMONIA 230* 84* 148*     ABG    Component Value Date/Time   PHART 7.37 10/10/2021 1605   PCO2ART 29 (L) 10/10/2021 1605   PO2ART 110 (H) 10/10/2021 1605   HCO3 17.3 (L) 10/11/2021 0500   ACIDBASEDEF 6.1 (H) 10/11/2021 0500   O2SAT 81.7 10/11/2021 0500      Coagulation Profile: Recent Labs  Lab 10/10/21 1019 10/11/21 0354 10/12/21 0338 10/13/21 0442  INR 2.0* 2.4* 2.3* 2.5*     Cardiac Enzymes: No results for input(s): CKTOTAL, CKMB, CKMBINDEX, TROPONINI in the last 168 hours.  HbA1C: Hgb A1c MFr Bld   Date/Time Value Ref Range Status  10/10/2021 04:22 PM 4.4 (L) 4.8 - 5.6 % Final    Comment:    (NOTE) Pre diabetes:          5.7%-6.4%  Diabetes:              >6.4%  Glycemic control for   <7.0% adults with diabetes   05/24/2019 06:47 AM 10.8 (H) 4.8 - 5.6 % Final    Comment:    (NOTE) Pre diabetes:          5.7%-6.4% Diabetes:              >6.4% Glycemic control  for   <7.0% adults with diabetes     CBG: Recent Labs  Lab 10/12/21 1935 10/12/21 1953 10/12/21 2315 10/13/21 0309 10/13/21 0736  GLUCAP 94 95 93 96 116*     Review of Systems:   Unable to assess due to patient's clinical status.  Past Medical History:  He,  has no past medical history on file.   Surgical History:   Past Surgical History:  Procedure Laterality Date   ESOPHAGOGASTRODUODENOSCOPY N/A 10/10/2021   Procedure: ESOPHAGOGASTRODUODENOSCOPY (EGD);  Surgeon: [name], MD;  Location: [facility] ENDOSCOPY;  Service: Endoscopy;  Laterality: N/A;     Social History:   reports that he has never smoked. He has never used smokeless tobacco. He reports current alcohol use. He reports that he does not use drugs.   Family History:  His Family history is unknown by patient.   Allergies No Known Allergies   Home Medications  Prior to Admission medications   Medication Sig Start Date End Date Taking? Authorizing Provider  blood glucose meter kit and supplies KIT Dispense based on patient and insurance preference. Check CBGs before each meal and before bedtime. Log in your results and show to your PCP in 1 week 05/30/19   Caren Griffins, MD  dexamethasone (DECADRON) 6 MG tablet Take 1 tablet (6 mg total) by mouth daily. 05/31/19   Caren Griffins, MD  furosemide (LASIX) 20 MG tablet Take 1 tablet (20 mg total) by mouth daily. 04/29/21 05/29/21  Rudene Re, MD  insulin glargine (LANTUS) 100 unit/mL SOPN Inject 0.1 mLs (10 Units total) into the skin daily. Please dispense 100 appropriate needles as  well. 05/30/19   Caren Griffins, MD  metFORMIN (GLUCOPHAGE) 500 MG tablet Take 1 tablet (500 mg total) by mouth 2 (two) times daily with a meal. 05/30/19 05/29/20  Gherghe, Vella Redhead, MD      CASE DISCUSSED IN MULTIDISCIPLINARY ROUNDS WITH ICU TEAM   Critical care provider statement:   Total critical care time: 33 minutes   Performed by: Lanney Gins MD   Critical care time was exclusive of separately billable procedures and treating other patients.   Critical care was necessary to treat or prevent imminent or life-threatening deterioration.   Critical care was time spent personally by me on the following activities: development of treatment plan with patient and/or surrogate as well as nursing, discussions with consultants, evaluation of patient's response to treatment, examination of patient, obtaining history from patient or surrogate, ordering and performing treatments and interventions, ordering and review of laboratory studies, ordering and review of radiographic studies, pulse oximetry and re-evaluation of patient's condition.    Ottie Glazier, M.D.  Pulmonary & Critical Care Medicine

## 2021-10-14 DIAGNOSIS — I8511 Secondary esophageal varices with bleeding: Secondary | ICD-10-CM

## 2021-10-14 LAB — GLUCOSE, CAPILLARY
Glucose-Capillary: 132 mg/dL — ABNORMAL HIGH (ref 70–99)
Glucose-Capillary: 90 mg/dL (ref 70–99)
Glucose-Capillary: 217 mg/dL — ABNORMAL HIGH (ref 70–99)
Glucose-Capillary: 143 mg/dL — ABNORMAL HIGH (ref 70–99)
Glucose-Capillary: 137 mg/dL — ABNORMAL HIGH (ref 70–99)

## 2021-10-14 LAB — COMPREHENSIVE METABOLIC PANEL
ALT: 15 U/L (ref 0–44)
AST: 26 U/L (ref 15–41)
Albumin: 1.9 g/dL — ABNORMAL LOW (ref 3.5–5.0)
Alkaline Phosphatase: 68 U/L (ref 38–126)
Anion gap: 5 (ref 5–15)
BUN: 38 mg/dL — ABNORMAL HIGH (ref 6–20)
CO2: 18 mmol/L — ABNORMAL LOW (ref 22–32)
Calcium: 7.4 mg/dL — ABNORMAL LOW (ref 8.9–10.3)
Chloride: 112 mmol/L — ABNORMAL HIGH (ref 98–111)
Creatinine, Ser: 1.52 mg/dL — ABNORMAL HIGH (ref 0.61–1.24)
GFR, Estimated: 54 mL/min — ABNORMAL LOW (ref >60)
Glucose, Bld: 154 mg/dL — ABNORMAL HIGH (ref 70–99)
Potassium: 3.9 mmol/L (ref 3.5–5.1)
Sodium: 135 mmol/L (ref 135–145)
Total Bilirubin: 2.3 mg/dL — ABNORMAL HIGH (ref 0.3–1.2)
Total Protein: 5.1 g/dL — ABNORMAL LOW (ref 6.5–8.1)

## 2021-10-14 LAB — CBC
HCT: 24.1 % — ABNORMAL LOW (ref 39.0–52.0)
Hemoglobin: 8.4 g/dL — ABNORMAL LOW (ref 13.0–17.0)
MCH: 35 pg — ABNORMAL HIGH (ref 26.0–34.0)
MCHC: 34.9 g/dL (ref 30.0–36.0)
MCV: 100.4 fL — ABNORMAL HIGH (ref 80.0–100.0)
Platelets: 95 10*3/uL — ABNORMAL LOW (ref 150–400)
RBC: 2.4 MIL/uL — ABNORMAL LOW (ref 4.22–5.81)
RDW: 13.8 % (ref 11.5–15.5)
WBC: 6.3 10*3/uL (ref 4.0–10.5)
nRBC: 0 % (ref 0.0–0.2)

## 2021-10-14 LAB — PROTIME-INR
INR: 2.4 — ABNORMAL HIGH (ref 0.8–1.2)
Prothrombin Time: 26.3 seconds — ABNORMAL HIGH (ref 11.4–15.2)

## 2021-10-14 LAB — PHOSPHORUS: Phosphorus: 3.1 mg/dL (ref 2.5–4.6)

## 2021-10-14 LAB — AMMONIA: Ammonia: 79 umol/L — ABNORMAL HIGH (ref 9–35)

## 2021-10-14 LAB — MAGNESIUM: Magnesium: 1.8 mg/dL (ref 1.7–2.4)

## 2021-10-14 LAB — TRIGLYCERIDES: Triglycerides: 35 mg/dL (ref <150)

## 2021-10-14 NOTE — Progress Notes (Signed)
NAME:  Ayansh Feutz, MRN:  [id number], DOB:  October 10, 1966, LOS: 4 ADMISSION DATE:  10/10/2021, CONSULTATION DATE:  10/14/21  REFERRING MD:  Ellender Hose, CHIEF COMPLAINT:  Hematemesis   History of Present Illness:  55 y.o. male with h/o decompensated alcoholic cirrhosis with portal hypertension and ascites presenting with encephalopathy in the setting of hyperammonemia (NH3 230 on arrival, previously 54 on 09/19/21), noted to have active hematemesis in the ED. Additional notable findings include AKI with creatinine of 1.68 (baseline 0.9 to 1.0), CO2 17, lactate 4.7, Hgb 11.5, INR 2.0. Hemodynamics stable in ED. GI consulted with plans for EGD.  10/11/21- patient is on MV , weaning of vasopressors today. Preparing for SBT if able to stabalize.  10/12/21- off vasopressos performing weaning trial today.  10/13/21- extubated, had SLP passed now eating, family came for visit 10/14/21- patient is stable on room air eating diet speaking in no distress with no signs of EtOH withdrawal  Pertinent  Medical History  Alcoholic cirrhosis with portal hypertension DM2  Significant Hospital Events: Including procedures, antibiotic start and stop dates in addition to other pertinent events   11/20: Admitted with hematemesis, encephalopathy  Interim History / Subjective:  N/A  Objective   Blood pressure 97/63, pulse 73, temperature 98.6 F (37 C), temperature source Oral, resp. rate 18, height _0  (1.651 m), weight 77.2 kg, SpO2 98 %.        Intake/Output Summary (Last 24 hours) at 10/14/2021 1206 Last data filed at 10/14/2021 1100 Gross per 24 hour  Intake 2683.24 ml  Output 1680 ml  Net 1003.24 ml    Filed Weights   10/12/21 0500 10/13/21 0440 10/14/21 0500  Weight: 82.2 kg 80.7 kg 77.2 kg    Examination: General:age appropriate intubated and sedated HENT: scleral icterus, ETT in place Lungs: CTA b/l, no W/C/R Cardiovascular: RRR, no M/R/G Abdomen: soft, non-distended, no fluid wave,  +BS Extremities: no C/C/E Neuro: intubated and sedated; RASS -4 GCS 4T GU: deferred  Resolved Hospital Problem list   N/A  Assessment & Plan:  Hematemesis in patient with known cirrhosis, concern for variceal bleed-RESOLVED  Coagulopathy of liver disease - GI consult (Dr. Allen Norris) with plans for EGD and possible banding - s/p protonix and octreotide with antibiotics   Abdominal ascites   -s/p paracetnesis     - workup for SBP in progress - patient feels much improved    Hepatic encephalopathy 2/2 hyperammonemia-RESOLVED Decompensated alcoholic cirrhosis with ascites - GI following as above -  CT head -no bleed noted - Start lactulose enemas from below; if no varices on EGD, can place NGT for oral administration - Trend ammonia initially to ensure down-trend - Daily CMP, INR; address last alcohol use; may consider transfer to liver center   AKI, possible hepatorenal syndrome versus pre-renal-IMPROVED  - Abdomen not tense or distended, defer paracentesis - Reassess renal function after initial resuscitative measures - Will consider albumin for hepatorenal syndrome  Lactic acidosis-RESOLVED  - Elevation in setting of acute illness, cirrhosis - Trend to close  DM2 - CBGs, SSI - No basal bolus  Best Practice (right click and "Reselect all SmartList Selections" daily)   Diet/type: NPO DVT prophylaxis: SCD GI prophylaxis: PPI Lines: N/A Foley:  N/A Code Status:  full code Last date of multidisciplinary goals of care discussion [N/A]  I Assessed the need for Labs I Assessed the need for Foley I Assessed the need for Central Venous Line Family Discussion when available I Assessed the need for Mobilization I  made an Assessment of medications to be adjusted accordingly Safety Risk assessment completed  Labs   CBC: Recent Labs  Lab 10/11/21 1538 10/11/21 2206 10/12/21 0338 10/13/21 0442 10/14/21 0530  WBC 5.8 6.5 6.4 8.5 6.3  HGB 8.8* 8.8* 9.2* 8.5* 8.4*  HCT  25.6* 26.0* 26.2* 24.7* 24.1*  MCV 101.2* 102.4* 104.0* 101.6* 100.4*  PLT 106* 101* 108* 91* 95*     Basic Metabolic Panel: Recent Labs  Lab 10/10/21 1019 10/10/21 1622 10/11/21 0354 10/12/21 0338 10/13/21 0442 10/14/21 0530  NA 133*  --  134* 137 135 135  K 4.1  --  4.5 4.3 4.2 3.9  CL 106  --  109 114* 113* 112*  CO2 17*  --  19* 18* 18* 18*  GLUCOSE 170*  --  111* 96 118* 154*  BUN 38*  --  39* 42* 42* 38*  CREATININE 1.68*  --  1.62* 1.70* 1.65* 1.52*  CALCIUM 8.6*  --  8.1* 7.9* 7.4* 7.4*  MG  --  2.0 1.8 1.8 1.8 1.8  PHOS  --  4.1 4.3 4.4 3.8 3.1    GFR: Estimated Creatinine Clearance: 53.9 mL/min (A) (by C-G formula based on SCr of 1.52 mg/dL (H)). Recent Labs  Lab 10/10/21 1019 10/10/21 1454 10/10/21 1622 10/10/21 1858 10/10/21 2113 10/10/21 2308 10/11/21 2206 10/12/21 0338 10/13/21 0442 10/14/21 0530  WBC 7.1  --    < >  --  6.0   < > 6.5 6.4 8.5 6.3  LATICACIDVEN 4.7* 1.5  --  2.1* 1.7  --   --   --   --   --    < > = values in this interval not displayed.     Liver Function Tests: Recent Labs  Lab 10/10/21 1019 10/11/21 0354 10/12/21 0338 10/13/21 0442 10/14/21 0530  AST 55* 35 32 25 26  ALT _0 ALKPHOS 119 75 69 66 68  BILITOT 3.6* 2.9* 2.2* 3.4* 2.3*  PROT 8.2* 6.0* 5.8* 5.4* 5.1*  ALBUMIN 2.9* 2.1* 2.3* 2.0* 1.9*    No results for input(s): LIPASE, AMYLASE in the last 168 hours. Recent Labs  Lab 10/10/21 1019 10/11/21 0354 10/13/21 0442 10/14/21 0500  AMMONIA 230* 84* 148* 79*     ABG    Component Value Date/Time   PHART 7.37 10/10/2021 1605   PCO2ART 29 (L) 10/10/2021 1605   PO2ART 110 (H) 10/10/2021 1605   HCO3 17.3 (L) 10/11/2021 0500   ACIDBASEDEF 6.1 (H) 10/11/2021 0500   O2SAT 81.7 10/11/2021 0500      Coagulation Profile: Recent Labs  Lab 10/10/21 1019 10/11/21 0354 10/12/21 0338 10/13/21 0442 10/14/21 0530  INR 2.0* 2.4* 2.3* 2.5* 2.4*     Cardiac Enzymes: No results for input(s):  CKTOTAL, CKMB, CKMBINDEX, TROPONINI in the last 168 hours.  HbA1C: Hgb A1c MFr Bld  Date/Time Value Ref Range Status  10/10/2021 04:22 PM 4.4 (L) 4.8 - 5.6 % Final    Comment:    (NOTE) Pre diabetes:          5.7%-6.4%  Diabetes:              >6.4%  Glycemic control for   <7.0% adults with diabetes   05/24/2019 06:47 AM 10.8 (H) 4.8 - 5.6 % Final    Comment:    (NOTE) Pre diabetes:          5.7%-6.4% Diabetes:              >  6.4% Glycemic control for   <7.0% adults with diabetes     CBG: Recent Labs  Lab 10/13/21 1945 10/13/21 2315 10/14/21 0343 10/14/21 0730 10/14/21 1102  GLUCAP 249* 197* 132* 90 217*     Review of Systems:   Unable to assess due to patient's clinical status.  Past Medical History:  He,  has no past medical history on file.   Surgical History:   Past Surgical History:  Procedure Laterality Date   ESOPHAGOGASTRODUODENOSCOPY N/A 10/10/2021   Procedure: ESOPHAGOGASTRODUODENOSCOPY (EGD);  Surgeon: [name], MD;  Location: [facility] ENDOSCOPY;  Service: Endoscopy;  Laterality: N/A;     Social History:   reports that he has never smoked. He has never used smokeless tobacco. He reports current alcohol use. He reports that he does not use drugs.   Family History:  His Family history is unknown by patient.   Allergies No Known Allergies   Home Medications  Prior to Admission medications   Medication Sig Start Date End Date Taking? Authorizing Provider  blood glucose meter kit and supplies KIT Dispense based on patient and insurance preference. Check CBGs before each meal and before bedtime. Log in your results and show to your PCP in 1 week 05/30/19   Caren Griffins, MD  dexamethasone (DECADRON) 6 MG tablet Take 1 tablet (6 mg total) by mouth daily. 05/31/19   Caren Griffins, MD  furosemide (LASIX) 20 MG tablet Take 1 tablet (20 mg total) by mouth daily. 04/29/21 05/29/21  Rudene Re, MD  insulin glargine (LANTUS) 100 unit/mL SOPN  Inject 0.1 mLs (10 Units total) into the skin daily. Please dispense 100 appropriate needles as well. 05/30/19   Caren Griffins, MD  metFORMIN (GLUCOPHAGE) 500 MG tablet Take 1 tablet (500 mg total) by mouth 2 (two) times daily with a meal. 05/30/19 05/29/20  Gherghe, Vella Redhead, MD      CASE DISCUSSED IN MULTIDISCIPLINARY ROUNDS WITH ICU TEAM   Critical care provider statement:   Total critical care time: 33 minutes   Performed by: Lanney Gins MD   Critical care time was exclusive of separately billable procedures and treating other patients.   Critical care was necessary to treat or prevent imminent or life-threatening deterioration.   Critical care was time spent personally by me on the following activities: development of treatment plan with patient and/or surrogate as well as nursing, discussions with consultants, evaluation of patient's response to treatment, examination of patient, obtaining history from patient or surrogate, ordering and performing treatments and interventions, ordering and review of laboratory studies, ordering and review of radiographic studies, pulse oximetry and re-evaluation of patient's condition.    Ottie Glazier, M.D.  Pulmonary & Critical Care Medicine

## 2021-10-14 NOTE — Consult Note (Signed)
PHARMACY CONSULT NOTE - FOLLOW UP  Pharmacy Consult for Electrolyte Monitoring and Replacement   Recent Labs: Potassium (mmol/L)  Date Value  10/14/2021 3.9   Magnesium (mg/dL)  Date Value  98/26/4158 1.8   Calcium (mg/dL)  Date Value  30/94/0768 7.4 (L)   Albumin (g/dL)  Date Value  08/81/1031 1.9 (L)   Phosphorus (mg/dL)  Date Value  59/45/8592 3.1   Sodium (mmol/L)  Date Value  10/14/2021 135     Assessment: 55yo w/ h/o  decompensated alcoholic cirrhosis c/b portal hypertension and ascites presenting with encephalopathy ISO hyperammonemia & active hematemesis in the ED. Found to have AKI likely 2/2 hepatorenal s/o. Pharmacy consulted for mgmt of electrolytes.  Goal of Therapy:  Lytes WNL  Plan:  All lytes WNL, no repletion req'd at this time. Will CTM and adjust PRN. Labs ordered.  Shawn Torres, PharmD Clinical Pharmacist 10/14/2021 10:27 AM

## 2021-10-14 NOTE — Progress Notes (Addendum)
Pt was admitted on the floor from ICU with no signs of distress. Pt only alert to self and place only. VSS. Spanish portable interpreter utilized  Teo # [phone] about pt concerns but per pt no concerns and questions and this time.Pt has a cardiac monitoring ordered but unit run out of monitor at this time. AC and charge nurse made aware. Will continue to monitor.  Update 0200: Per AC he will try to ask other unit for any cardiac monitoring available.  Update 0500: Foley catheter and flexi-seal was removed. Pt was educated about the importance of voiding after 6 hours utlizing interpreter # [id number]. Pt has no conerns ord question at this time. Will notify incoming shift. Will continue to monitor.  Update 0700: No cardiac monitoring available at this time. Incoming shift made aware. Will continue to monitor.

## 2021-10-14 NOTE — Progress Notes (Signed)
Midge Minium, MD [facility]   [address], [address] [place], [place] [address] Phone: [phone] Fax : [phone]   Subjective: The patient is sitting up in bed and eating in no apparent distress.  The patient had a paracentesis that did not show any sign of SBP.  The patient is doing much better today and denies any abdominal pain.  His hemoglobin has been stable.  He has not been compliant with his medications as an outpatient and had been admitted with decompensated alcoholic cirrhosis and portal hypertension an upper GI bleed.  The patient underwent an EGD with banding and was intubated and put in the ICU.  The patient is now off pressors and extubated without any complaints.   Objective: Vital signs in last 24 hours: Vitals:   10/14/21 1000 10/14/21 1100 10/14/21 1200 10/14/21 1300  BP: 97/63  100/72 102/72  Pulse: 73 71 80 88  Resp: 18 19 17 14   Temp:    99 F (37.2 C)  TempSrc:    Oral  SpO2: 98% 100% 100% 99%  Weight:      Height:       Weight change: -3.5 kg  Intake/Output Summary (Last 24 hours) at 10/14/2021 1329 Last data filed at 10/14/2021 1200 Gross per 24 hour  Intake 2878.21 ml  Output 1680 ml  Net 1198.21 ml     Exam: Heart:: Regular rate and rhythm, S1S2 present, or without murmur or extra heart sounds Lungs: normal and clear to auscultation and percussion Abdomen: Distended nontender without rebound or guarding   Lab Results: @LABTEST2 @ Micro Results: Recent Results (from the past 240 hour(s))  Resp Panel by RT-PCR (Flu A&B, Covid) Nasopharyngeal Swab     Status: None   Collection Time: 10/10/21 10:50 AM   Specimen: Nasopharyngeal Swab; Nasopharyngeal(NP) swabs in vial transport medium  Result Value Ref Range Status   SARS Coronavirus 2 by RT PCR NEGATIVE NEGATIVE Final    Comment: (NOTE) SARS-CoV-2 target nucleic acids are NOT DETECTED.  The SARS-CoV-2 RNA is generally detectable in upper respiratory specimens during the acute phase of  infection. The lowest concentration of SARS-CoV-2 viral copies this assay can detect is 138 copies/mL. A negative result does not preclude SARS-Cov-2 infection and should not be used as the sole basis for treatment or other patient management decisions. A negative result may occur with  improper specimen collection/handling, submission of specimen other than nasopharyngeal swab, presence of viral mutation(s) within the areas targeted by this assay, and inadequate number of viral copies(<138 copies/mL). A negative result must be combined with clinical observations, patient history, and epidemiological information. The expected result is Negative.  Fact Sheet for Patients:   Fact Sheet for Healthcare Providers:  10/12/21  This test is no t yet approved or cleared by the [employer] FDA and  has been authorized for detection and/or diagnosis of SARS-CoV-2 by FDA under an Emergency Use Authorization (EUA). This EUA will remain  in effect (meaning this test can be used) for the duration of the COVID-19 declaration under Section 564(b)(1) of the Act, 21 U.S.C.section 360bbb-3(b)(1), unless the authorization is terminated  or revoked sooner.       Influenza A by PCR NEGATIVE NEGATIVE Final   Influenza B by PCR NEGATIVE NEGATIVE Final    Comment: (NOTE) The Xpert Xpress SARS-CoV-2/FLU/RSV plus assay is intended as an aid in the diagnosis of influenza from Nasopharyngeal swab specimens and should not be used as a sole basis for treatment. Nasal washings and  aspirates are unacceptable for Xpert Xpress SARS-CoV-2/FLU/RSV testing.  Fact Sheet for Patients: [employer]com  Fact Sheet for Healthcare Providers: Serious[employer]ker.it  This test is not yet approved or cleared by the [place] FDA and has been authorized for detection and/or diagnosis of SARS-CoV-2  by FDA under an Emergency Use Authorization (EUA). This EUA will remain in effect (meaning this test can be used) for the duration of the COVID-19 declaration under Section 564(b)(1) of the Act, 21 U.S.C. section 360bbb-3(b)(1), unless the authorization is terminated or revoked.  Performed at [facility], [address] [place], [place] [address]   MRSA Next Gen by PCR, Nasal     Status: None   Collection Time: 10/10/21  2:47 PM   Specimen: Nasal Mucosa; Nasal Swab  Result Value Ref Range Status   MRSA by PCR Next Gen NOT DETECTED NOT DETECTED Final    Comment: (NOTE) The GeneXpert MRSA Assay (FDA approved for NASAL specimens only), is one component of a comprehensive MRSA colonization surveillance program. It is not intended to diagnose MRSA infection nor to guide or monitor treatment for MRSA infections. Test performance is not FDA approved in patients less than 82 years old. Performed at [facility], [address] [place], [place] [address]   Body fluid culture w Gram Stain     Status: None (Preliminary result)   Collection Time: 10/13/21 11:41 AM   Specimen: PATH Cytology Peritoneal fluid  Result Value Ref Range Status   Specimen Description   Final    PERITONEAL Performed at [facility], [address] [place], [place] [address]    Special Requests NONE  Final   Gram Stain   Final    RARE WBC PRESENT, PREDOMINANTLY MONONUCLEAR NO ORGANISMS SEEN    Culture   Final    NO GROWTH < 12 HOURS Performed at [facility], [address] [place], [place] [address]    Report Status PENDING  Incomplete   Studies/Results: US Paracentesis  Result Date: 10/13/2021 INDICATION: Ascites request received for diagnostic and therapeutic paracentesis, patient on vasopressor support. EXAM: ULTRASOUND GUIDED  PARACENTESIS MEDICATIONS: Local 1% lidocaine only. COMPLICATIONS: None immediate. PROCEDURE: Informed written consent was obtained from  the patient after a discussion of the risks, benefits and alternatives to treatment. A timeout was performed prior to the initiation of the procedure. Initial ultrasound scanning demonstrates a large amount of ascites within the right lower abdominal quadrant. The right lower abdomen was prepped and draped in the usual sterile fashion. 1% lidocaine was used for local anesthesia. Following this, a 19 gauge, 7-cm, Yueh catheter was introduced. An ultrasound image was saved for documentation purposes. The paracentesis was performed. The catheter was removed and a dressing was applied. The patient tolerated the procedure well without immediate post procedural complication. A total of 5 L was removed there is additional ascites that remains however patient is requiring vasopressor support at this time so decision was made to only remove 5 Liters. FINDINGS: A total of approximately 5 L of clear yellow fluid was removed. Samples were sent to the laboratory as requested by the clinical team. IMPRESSION: Successful ultrasound-guided paracentesis yielding 5 liters of peritoneal fluid. Read By: Pattricia Boss PA-C Electronically Signed   By: Olive Bass M.D.   On: 10/13/2021 13:57   DG Chest Port 1 View  Result Date: 10/13/2021 CLINICAL DATA:  Acute respiratory failure with hypoxia (HCC) J96.01 (ICD-10-CM) EXAM: PORTABLE CHEST 1 VIEW COMPARISON:  October 10, 2021. FINDINGS:  Endotracheal tube tip at the level of the clavicular heads, unchanged. Left IJ central venous catheter with the tip projecting at the superior cavoatrial junction, unchanged. Low lung volumes with similar streaky bilateral lung opacities. No visible pleural effusions or pneumothorax on this semi erect radiograph. IMPRESSION: 1. Stable support devices. 2. Low lung volumes with similar streaky bilateral opacities, most likely atelectasis. Electronically Signed   By: Feliberto Harts M.D.   On: 10/13/2021 07:58   Medications: I have reviewed the  patient's current medications. Scheduled Meds:  Chlorhexidine Gluconate Cloth  6 each Topical Daily   feeding supplement  237 mL Oral TID BM   insulin aspart  0-15 Units Subcutaneous Q4H   lactulose  20 g Oral TID   mouth rinse  15 mL Mouth Rinse 10 times per day   multivitamin with minerals  1 tablet Oral Daily   thiamine injection  100 mg Intravenous Daily   Continuous Infusions:  sodium chloride 10 mL/hr at 10/14/21 1200   cefTRIAXone (ROCEPHIN)  IV Stopped (10/13/21 1642)   norepinephrine (LEVOPHED) Adult infusion Stopped (10/13/21 1430)   octreotide  (SANDOSTATIN)    IV infusion 50 mcg/hr (10/14/21 1200)   pantoprazole 8 mg/hr (10/14/21 1200)   PRN Meds:.docusate sodium, fentaNYL (SUBLIMAZE) injection, midazolam, ondansetron (ZOFRAN) IV, polyethylene glycol   Assessment: Principal Problem:   Hematemesis Active Problems:   Secondary esophageal varices without bleeding (HCC)   Secondary esophageal varices with bleeding (HCC)    Plan: This patient is doing much better today.  He was admitted with a variceal bleed and hepatic encephalopathy.  The patient doing much better at this time and his hemoglobin has been stable.  The patient will be transferred out of the ICU and I have discussed the plan with the intensivist.  The patient should follow-up as an outpatient with his primary gastrologist.  He appears to have been seen in the past at [facility] but is welcome to follow-up in our office upon discharge. Nothing further to add from a GI point of view.  I will sign off.  Please call if any further GI concerns or questions.  We would like to thank you for the opportunity to participate in the care of Shawn Torres.    LOS: 4 days   Sherlyn Hay 10/14/2021, 1:29 PM Pager [phone] 7am-5pm  Check AMION for 5pm -7am coverage and on weekends

## 2021-10-15 DIAGNOSIS — J9601 Acute respiratory failure with hypoxia: Secondary | ICD-10-CM

## 2021-10-15 DIAGNOSIS — R140 Abdominal distension (gaseous): Secondary | ICD-10-CM

## 2021-10-15 DIAGNOSIS — E440 Moderate protein-calorie malnutrition: Secondary | ICD-10-CM

## 2021-10-15 LAB — COMPREHENSIVE METABOLIC PANEL
ALT: 16 U/L (ref 0–44)
AST: 27 U/L (ref 15–41)
Albumin: 1.9 g/dL — ABNORMAL LOW (ref 3.5–5.0)
Alkaline Phosphatase: 69 U/L (ref 38–126)
Anion gap: 3 — ABNORMAL LOW (ref 5–15)
BUN: 31 mg/dL — ABNORMAL HIGH (ref 6–20)
CO2: 19 mmol/L — ABNORMAL LOW (ref 22–32)
Calcium: 7.2 mg/dL — ABNORMAL LOW (ref 8.9–10.3)
Chloride: 113 mmol/L — ABNORMAL HIGH (ref 98–111)
Creatinine, Ser: 1.09 mg/dL (ref 0.61–1.24)
GFR, Estimated: >60 mL/min (ref >60)
Glucose, Bld: 137 mg/dL — ABNORMAL HIGH (ref 70–99)
Potassium: 4 mmol/L (ref 3.5–5.1)
Sodium: 135 mmol/L (ref 135–145)
Total Bilirubin: 2.3 mg/dL — ABNORMAL HIGH (ref 0.3–1.2)
Total Protein: 5.4 g/dL — ABNORMAL LOW (ref 6.5–8.1)

## 2021-10-15 LAB — GLUCOSE, CAPILLARY
Glucose-Capillary: 107 mg/dL — ABNORMAL HIGH (ref 70–99)
Glucose-Capillary: 109 mg/dL — ABNORMAL HIGH (ref 70–99)
Glucose-Capillary: 142 mg/dL — ABNORMAL HIGH (ref 70–99)
Glucose-Capillary: 151 mg/dL — ABNORMAL HIGH (ref 70–99)
Glucose-Capillary: 169 mg/dL — ABNORMAL HIGH (ref 70–99)
Glucose-Capillary: 182 mg/dL — ABNORMAL HIGH (ref 70–99)

## 2021-10-15 LAB — CBC
HCT: 27.2 % — ABNORMAL LOW (ref 39.0–52.0)
Hemoglobin: 9.8 g/dL — ABNORMAL LOW (ref 13.0–17.0)
MCH: 36.7 pg — ABNORMAL HIGH (ref 26.0–34.0)
MCHC: 36 g/dL (ref 30.0–36.0)
MCV: 101.9 fL — ABNORMAL HIGH (ref 80.0–100.0)
Platelets: 107 10*3/uL — ABNORMAL LOW (ref 150–400)
RBC: 2.67 MIL/uL — ABNORMAL LOW (ref 4.22–5.81)
RDW: 13.8 % (ref 11.5–15.5)
WBC: 8.1 10*3/uL (ref 4.0–10.5)
nRBC: 0 % (ref 0.0–0.2)

## 2021-10-15 LAB — PHOSPHORUS: Phosphorus: 2.3 mg/dL — ABNORMAL LOW (ref 2.5–4.6)

## 2021-10-15 LAB — PROTIME-INR
INR: 2.3 — ABNORMAL HIGH (ref 0.8–1.2)
Prothrombin Time: 24.9 seconds — ABNORMAL HIGH (ref 11.4–15.2)

## 2021-10-15 LAB — MAGNESIUM: Magnesium: 1.9 mg/dL (ref 1.7–2.4)

## 2021-10-15 MED ORDER — CHLORHEXIDINE GLUCONATE CLOTH 2 % EX PADS
6.0000 | MEDICATED_PAD | Freq: Every day | CUTANEOUS | Status: DC
  Administered 2021-10-15 – 2021-10-16 (×2): 6 via TOPICAL

## 2021-10-15 MED ORDER — GLUCERNA SHAKE PO LIQD
237.0000 mL | Freq: Three times a day (TID) | ORAL | Status: DC
  Administered 2021-10-16: 08:00:00 237 mL via ORAL

## 2021-10-15 MED ORDER — TAMSULOSIN HCL 0.4 MG PO CAPS
0.4000 mg | ORAL_CAPSULE | Freq: Every day | ORAL | Status: DC
  Administered 2021-10-15 – 2021-10-16 (×2): 0.4 mg via ORAL
  Filled 2021-10-15 (×2): qty 1

## 2021-10-15 NOTE — Plan of Care (Signed)
  Problem: Health Behavior/Discharge Planning: Goal: Ability to manage health-related needs will improve Outcome: Progressing   Problem: Elimination: Goal: Will not experience complications related to bowel motility Outcome: Progressing   Problem: Elimination: Goal: Will not experience complications related to urinary retention Outcome: Progressing   Problem: Pain Managment: Goal: General experience of comfort will improve Outcome: Progressing   Problem: Safety: Goal: Ability to remain free from injury will improve Outcome: Progressing

## 2021-10-15 NOTE — Progress Notes (Signed)
Nutrition Follow-up  DOCUMENTATION CODES:   Non-severe (moderate) malnutrition in context of chronic illness  INTERVENTION:   -D/c Ensure Enlive po TID, each supplement provides 350 kcal and 20 grams of protein  -Continue MVI with minerals daily -Glucerna Shake po TID, each supplement provides 220 kcal and 10 grams of protein   NUTRITION DIAGNOSIS:   Moderate Malnutrition related to chronic illness (cirrhosis, etoh abuse) as evidenced by moderate fat depletion, moderate muscle depletion.  Ongoing  GOAL:   Patient will meet greater than or equal to 90% of their needs  Progressing   MONITOR:   PO intake, Supplement acceptance, Labs, Weight trends, Skin, I & O's  REASON FOR ASSESSMENT:   Ventilator    ASSESSMENT:   55 y.o. male with h/o decompensated alcoholic cirrhosis with portal hypertension and ascites who is admitted with encephalopathy in the setting of hyperammonemia, hematemesis and AKI now s/p EGD 11/20 with banding of esophageal varices.  11/23- extubated, s/p RLQ paracentesis (5 L yellow fluid removed), s/p BSE- advanced to regular diet with thin liquids  Reviewed I/O's: +150 ml x 24 hours and +712 ml since admission  UOP: 1.6 L x 24 hours  Rectal tube output: 200 ml x 24 hours  Pt unavailable at time of visit. Attempted to speak with pt via call to hospital room phone, however, unable to reach.   Pt currently on a carb modified diet with good oral intake. Noted meal completions 100%.   Pt is consuming Ensure supplements, but will change to Glucerna due to good oral intake and some hyperglycemic episodes.   Medications reviewed and include lactulose and thiamine.   Lab Results  Component Value Date   HGBA1C 4.4 (L) 10/10/2021   PTA DM medications are .   Labs reviewed: CBGS: 90-217 (inpatient orders for glycemic control are 0-15 units insulin aspart every 4 hours).    Diet Order:   Diet Order             Diet Carb Modified Fluid consistency:  Thin; Room service appropriate? Yes  Diet effective now                   EDUCATION NEEDS:   No education needs have been identified at this time  Skin:  Skin Assessment: Reviewed RN Assessment  Last BM:  10/15/21 (200 ml via rectal tube)  Height:   Ht Readings from Last 1 Encounters:  10/11/21 5\' 5"  (1.651 m)    Weight:   Wt Readings from Last 1 Encounters:  10/15/21 77.4 kg    Ideal Body Weight:  61.8 kg  BMI:  Body mass index is 28.4 kg/m.  Estimated Nutritional Needs:   Kcal:  2000-2300kcal/day  Protein:  100-115g/day  Fluid:  1.5-1.8L/day    10/17/21, RD, LDN, CDCES Registered Dietitian II Certified Diabetes Care and Education Specialist Please refer to AMION for RD and/or RD on-call/weekend/after hours pager

## 2021-10-15 NOTE — Progress Notes (Signed)
PROGRESS NOTE  Shawn Torres    DOB: 14-Sep-1966, 55 y.o.  MIW:[id number]  PCP: Pcp, No   Code Status: Full Code   DOA: 10/10/2021   LOS: 5  Brief Narrative of Current Hospitalization  Shawn Torres is a 55 y.o. male with a PMH significant for decompensated alcoholic cirrhosis, portal HTN, ascites. They presented from home to the ED on 10/10/2021 with hepatic encephalopathy x several days. In the ED, it was found that they had ammonia 230, hematemesis, AKI. They were treated with supportive therapy and admission to ICU.   11/20-EGD, intubated 11/21- 11/23- extubated, paracentesis  10/15/21 -[facility] assumed care  Assessment & Plan  Principal Problem:   Hematemesis Active Problems:   Secondary esophageal varices without bleeding (HCC)   Secondary esophageal varices with bleeding (HCC)   Malnutrition of moderate degree  Alcoholic cirrhosis- currently compensated Acute variceal bleed- resolved s/p EGD 11/20 Ascites- s/p paracentesis Encephalopathy- resolved - hgb stable at 9.8, AST/ALT normalized. Patient denies hematemesis, abdominal pain, nausea.  - GI following, appreciate recs - continue lactulose TID to goal of 3 Bms/day - discontinue octreotide gtt - transition PPI to PO - continue thiamine supplement - PT/OT  Thrombocytopenia- platelets 107 - CBC am  AKI- resolved.  - encourage PO hydration  Type 2 DM- well controlled - sSSI   DVT prophylaxis: SCDs Start: 10/10/21 1451   Diet:  Diet Orders (From admission, onward)     Start     Ordered   10/14/21 1109  Diet Carb Modified Fluid consistency: Thin; Room service appropriate? Yes  Diet effective now       Question Answer Comment  Diet-HS Snack? Nothing   Calorie Level Medium 1600-2000   Fluid consistency: Thin   Room service appropriate? Yes      10/14/21 1108            Subjective 10/15/21    Pt reports no abdominal pain, N/V. No melena or hematemesis.   Disposition Plan & Communication  Patient status:  Inpatient  Admitted From: Home Disposition: Home Anticipated discharge date: 11/27  Family Communication: daughter at bedside  Consults, Procedures, Significant Events  Consultants:  GI  Procedures/significant events:  Intubation EGD Antimicrobials:  Anti-infectives (From admission, onward)    Start     Dose/Rate Route Frequency Ordered Stop   10/13/21 1600  cefTRIAXone (ROCEPHIN) 2 g in sodium chloride 0.9 % 100 mL IVPB        2 g 200 mL/hr over 30 Minutes Intravenous Every 24 hours 10/13/21 1028 10/14/21 1615   10/11/21 0800  cefTRIAXone (ROCEPHIN) 1 g in sodium chloride 0.9 % 100 mL IVPB  Status:  Discontinued        1 g 200 mL/hr over 30 Minutes Intravenous Every 24 hours 10/10/21 1449 10/10/21 1508   10/10/21 1508  cefTRIAXone (ROCEPHIN) 1 g in sodium chloride 0.9 % 100 mL IVPB  Status:  Discontinued        1 g 200 mL/hr over 30 Minutes Intravenous Every 24 hours 10/10/21 1508 10/13/21 1028   10/10/21 1045  cefTRIAXone (ROCEPHIN) 2 g in sodium chloride 0.9 % 100 mL IVPB  Status:  Discontinued        2 g 200 mL/hr over 30 Minutes Intravenous  Once 10/10/21 1035 10/10/21 1508       Objective   Vitals:   10/14/21 2300 10/14/21 2358 10/15/21 0509 10/15/21 0616  BP: 121/75 105/67 100/73   Pulse: 79  75   Resp: 19 20 20  Temp:  98.3 F (36.8 C) 98.4 F (36.9 C)   TempSrc:   Oral   SpO2: 100% 100% 97%   Weight:    77.4 kg  Height:        Intake/Output Summary (Last 24 hours) at 10/15/2021 0653 Last data filed at 10/15/2021 0641 Gross per 24 hour  Intake 1979.96 ml  Output 2380 ml  Net -400.04 ml   Filed Weights   10/13/21 0440 10/14/21 0500 10/15/21 0616  Weight: 80.7 kg 77.2 kg 77.4 kg    Patient BMI: Body mass index is 28.4 kg/m.   Physical Exam: General: awake, alert, NAD HEENT: atraumatic, clear conjunctiva, anicteric sclera, moist mucus membranes, hearing grossly normal Respiratory: normal respiratory effort. Cardiovascular: normal S1/S2, RRR, no  JVD, murmurs, rubs, gallops, quick capillary refill  Gastrointestinal: soft, NT, ND Nervous: A&O x3. no gross focal neurologic deficits, normal speech Extremities: moves all equally, no edema, normal tone Skin: dry, intact, normal temperature, normal color, No rashes, lesions or ulcers Psychiatry: normal mood, congruent affect  Labs   I have personally reviewed following labs and imaging studies No results displayed because visit has over 200 results.      Imaging Studies  No results found. Medications   Scheduled Meds:  feeding supplement  237 mL Oral TID BM   insulin aspart  0-15 Units Subcutaneous Q4H   lactulose  20 g Oral TID   multivitamin with minerals  1 tablet Oral Daily   thiamine injection  100 mg Intravenous Daily   No recently discontinued medications to reconcile  LOS: 5 days   Time spent: >73min  Leeroy Bock, DO [facility] 10/15/2021, 6:53 AM   Please refer to amion to contact the [facility] Attending or Consulting provider for this pt  [email or web address] Available by Epic secure chat 7AM-7PM. If 7PM-7AM, please contact night-coverage

## 2021-10-16 DIAGNOSIS — F1021 Alcohol dependence, in remission: Secondary | ICD-10-CM

## 2021-10-16 LAB — CBC
HCT: 27.3 % — ABNORMAL LOW (ref 39.0–52.0)
Hemoglobin: 10 g/dL — ABNORMAL LOW (ref 13.0–17.0)
MCH: 37.7 pg — ABNORMAL HIGH (ref 26.0–34.0)
MCHC: 36.6 g/dL — ABNORMAL HIGH (ref 30.0–36.0)
MCV: 103 fL — ABNORMAL HIGH (ref 80.0–100.0)
Platelets: 116 10*3/uL — ABNORMAL LOW (ref 150–400)
RBC: 2.65 MIL/uL — ABNORMAL LOW (ref 4.22–5.81)
RDW: 14.2 % (ref 11.5–15.5)
WBC: 7.9 10*3/uL (ref 4.0–10.5)
nRBC: 0 % (ref 0.0–0.2)

## 2021-10-16 LAB — PROTIME-INR
INR: 2.3 — ABNORMAL HIGH (ref 0.8–1.2)
Prothrombin Time: 24.9 seconds — ABNORMAL HIGH (ref 11.4–15.2)

## 2021-10-16 LAB — GLUCOSE, CAPILLARY
Glucose-Capillary: 109 mg/dL — ABNORMAL HIGH (ref 70–99)
Glucose-Capillary: 112 mg/dL — ABNORMAL HIGH (ref 70–99)
Glucose-Capillary: 115 mg/dL — ABNORMAL HIGH (ref 70–99)
Glucose-Capillary: 139 mg/dL — ABNORMAL HIGH (ref 70–99)

## 2021-10-16 LAB — COMPREHENSIVE METABOLIC PANEL
ALT: 16 U/L (ref 0–44)
AST: 26 U/L (ref 15–41)
Albumin: 1.7 g/dL — ABNORMAL LOW (ref 3.5–5.0)
Alkaline Phosphatase: 66 U/L (ref 38–126)
Anion gap: 5 (ref 5–15)
BUN: 34 mg/dL — ABNORMAL HIGH (ref 6–20)
CO2: 18 mmol/L — ABNORMAL LOW (ref 22–32)
Calcium: 7.4 mg/dL — ABNORMAL LOW (ref 8.9–10.3)
Chloride: 110 mmol/L (ref 98–111)
Creatinine, Ser: 1.12 mg/dL (ref 0.61–1.24)
GFR, Estimated: >60 mL/min (ref >60)
Glucose, Bld: 111 mg/dL — ABNORMAL HIGH (ref 70–99)
Potassium: 4.2 mmol/L (ref 3.5–5.1)
Sodium: 133 mmol/L — ABNORMAL LOW (ref 135–145)
Total Bilirubin: 2.8 mg/dL — ABNORMAL HIGH (ref 0.3–1.2)
Total Protein: 5.1 g/dL — ABNORMAL LOW (ref 6.5–8.1)

## 2021-10-16 LAB — MAGNESIUM: Magnesium: 1.9 mg/dL (ref 1.7–2.4)

## 2021-10-16 LAB — PHOSPHORUS: Phosphorus: 2.7 mg/dL (ref 2.5–4.6)

## 2021-10-16 MED ORDER — LACTULOSE 10 GM/15ML PO SOLN
20.0000 g | Freq: Three times a day (TID) | ORAL | 0 refills | Status: DC
Start: 2021-10-16 — End: 2021-11-03

## 2021-10-16 MED ORDER — ONDANSETRON 4 MG PO TBDP: 4.0000 mg | ORAL_TABLET | Freq: Three times a day (TID) | ORAL | 0 refills | Status: DC | PRN

## 2021-10-16 MED ORDER — TAMSULOSIN HCL 0.4 MG PO CAPS
0.4000 mg | ORAL_CAPSULE | Freq: Every day | ORAL | 0 refills | Status: DC
Start: 2021-10-17 — End: 2022-07-27

## 2021-10-16 MED ORDER — PANTOPRAZOLE SODIUM 40 MG PO TBEC: 40.0000 mg | DELAYED_RELEASE_TABLET | Freq: Every day | ORAL | 0 refills | Status: DC

## 2021-10-16 MED ORDER — PANTOPRAZOLE SODIUM 40 MG PO TBEC: 40.0000 mg | DELAYED_RELEASE_TABLET | Freq: Every day | ORAL | Status: DC

## 2021-10-16 MED ORDER — THIAMINE HCL 100 MG PO TABS: 100.0000 mg | ORAL_TABLET | Freq: Every day | ORAL | 0 refills | Status: DC

## 2021-10-16 MED ORDER — ADULT MULTIVITAMIN W/MINERALS CH
1.0000 | ORAL_TABLET | Freq: Every day | ORAL | 0 refills | Status: AC
Start: 2021-10-17 — End: 2021-11-16

## 2021-10-16 NOTE — Progress Notes (Signed)
Discharge instructions and foley care education provided to patient's son who is now at bedside.

## 2021-10-16 NOTE — Evaluation (Signed)
Physical Therapy Evaluation Patient Details Name: Shawn Torres MRN: [id number] DOB: [birth year] Today's Date: 10/16/2021  History of Present Illness  55 y.o. male with h/o decompensated alcoholic cirrhosis with portal hypertension and ascites presenting with encephalopathy in the setting of hyperammonemia.  Intubated 11/21-23.  Clinical Impression  Pt did reasonably well with PT exam but did need repeated cuing/encouragement to stay on task.  He was ultimately able to ambulate ~300 ft and go up/down steps but is not at his baseline.  He reports he is able to walk outside in the park, etc and has never needed an AD.  Today he was clearly unsteady and needing UE support (using hallway rail and/or PT's hand) when not using AD but was much safer, confident and steady with AD use.  Pt was able to manage up/down steps w/o assist (again heavy UE use but safe and w/o physical assist).  [facility]ed [facility] but pt felt that returning home with walker, family assist and easing back into activity will be sufficient.  PT safe to d/c home with family support.      Recommendations for follow up therapy are one component of a multi-disciplinary discharge planning process, led by the attending physician.  Recommendations may be updated based on patient status, additional functional criteria and insurance authorization.  Follow Up Recommendations  (discussed with pt via interp - he's just interested in going home feels he will be able to get back to his normal activity w/o PT)    Assistance Recommended at Discharge Intermittent Supervision/Assistance  Functional Status Assessment Patient has had a recent decline in their functional status and demonstrates the ability to make significant improvements in function in a reasonable and predictable amount of time.  Equipment Recommendations  Rolling walker (2 wheels)    Recommendations for Other Services       Precautions / Restrictions  Precautions Precautions: Fall Restrictions Weight Bearing Restrictions: No      Mobility  Bed Mobility Overal bed mobility: Independent             General bed mobility comments: slow to initiate movement to EOB but able to control trunk and get LEs off and back onto bed to sitting w/o assist    Transfers Overall transfer level: Modified independent Equipment used: None               General transfer comment: Pt was able to rise with relative confidence but did show some mild unsteadiness once standing w/o UE support.  Reaching for surfaces immediately on standing    Ambulation/Gait Ambulation/Gait assistance: Modified independent (Device/Increase time) Gait Distance (Feet): 300 Feet Assistive device: Rolling walker (2 wheels);None         General Gait Details: Pt did ~150 ft without AD but needed UEs on hallway rail/wall/PT's hand essentially the entire time with general unsteadiness and mild stagger stepping.  ~150 ft with walker and pt was much more stable, confident and safe.  Stairs Stairs: Yes Stairs assistance: Min guard Stair Management: Two rails;Forwards Number of Stairs: 5 General stair comments: Pt was able to safetly negotiate steps with definite use of rails  Wheelchair Mobility    Modified Rankin (Stroke Patients Only)       Balance Overall balance assessment: Needs assistance Sitting-balance support: No upper extremity supported Sitting balance-Leahy Scale: Good     Standing balance support: No upper extremity supported Standing balance-Leahy Scale: Fair Standing balance comment: definite unsteadiness and staggering w/o UEs during dynamic  standing/balance acts.                             Pertinent Vitals/Pain Pain Assessment: No/denies pain    Home Living Family/patient expects to be discharged to:: Private residence Living Arrangements: Children;Other relatives Available Help at Discharge: Available 24 hours/day  (son-in-law apparently home nearly 24/7 and helps him out)   Home Access: Stairs to enter   Entrance Stairs-Number of Steps: 2   Home Layout: One level Home Equipment: None      Prior Function Prior Level of Function : Independent/Modified Independent             Mobility Comments: able to be up and relatively active w/o AD ADLs Comments: reports he is able to do light meal prep and cleaning     Hand Dominance        Extremity/Trunk Assessment   Upper Extremity Assessment Upper Extremity Assessment: Overall WFL for tasks assessed;Generalized weakness    Lower Extremity Assessment Lower Extremity Assessment: Overall WFL for tasks assessed;Generalized weakness       Communication   Communication: Prefers language other than English (tele interp Suzette Battiest [phone])  Cognition Arousal/Alertness: Awake/alert Behavior During Therapy: WFL for tasks assessed/performed Overall Cognitive Status: Within Functional Limits for tasks assessed                                          General Comments      Exercises     Assessment/Plan    PT Assessment Patient needs continued PT services  PT Problem List Decreased balance;Decreased safety awareness       PT Treatment Interventions DME instruction;Gait training;Stair training;Functional mobility training;Therapeutic activities;Therapeutic exercise;Balance training;Neuromuscular re-education;Patient/family education    PT Goals (Current goals can be found in the Care Plan section)  Acute Rehab PT Goals Patient Stated Goal: go home PT Goal Formulation: With patient Time For Goal Achievement: 10/30/21 Potential to Achieve Goals: Good    Frequency Min 2X/week   Barriers to discharge        Co-evaluation               AM-PAC PT "6 Clicks" Mobility  Outcome Measure Help needed turning from your back to your side while in a flat bed without using bedrails?: None Help needed moving from lying  on your back to sitting on the side of a flat bed without using bedrails?: None Help needed moving to and from a bed to a chair (including a wheelchair)?: None Help needed standing up from a chair using your arms (e.g., wheelchair or bedside chair)?: None Help needed to walk in hospital room?: A Little Help needed climbing 3-5 steps with a railing? : A Little 6 Click Score: 22    End of Session Equipment Utilized During Treatment: Gait belt Activity Tolerance: Patient tolerated treatment well Patient left: with bed alarm set;with call bell/phone within reach Nurse Communication: Mobility status PT Visit Diagnosis: Unsteadiness on feet (R26.81);Muscle weakness (generalized) (M62.81);Difficulty in walking, not elsewhere classified (R26.2)    Time: 0925-1010 PT Time Calculation (min) (ACUTE ONLY): 45 min   Charges:   PT Evaluation $PT Eval Low Complexity: 1 Low PT Treatments $Gait Training: 8-22 mins $Therapeutic Activity: 8-22 mins        Malachi Pro, DPT 10/16/2021, 12:41 PM

## 2021-10-16 NOTE — Discharge Instructions (Signed)
Pick up your medications at [facility] and take them as directed.  You will need to follow up with gastrological doctor and urology doctor. They will contact you for an appointment.  Eat a liquid diet until you do not have nausea or vomiting any more.  You can not drink any more alcohol or it could cause you to die.  Recoja sus medicamentos en [facility] y tmelos segn las indicaciones. Deber hacer un seguimiento con un mdico gastronmico y Copy. Se pondrn en contacto contigo para concertar una cita. Siga una dieta lquida hasta que ya no tenga nuseas ni vmitos. No puede beber ms alcohol o podra causarle la muerte.

## 2021-10-16 NOTE — Evaluation (Signed)
Occupational Therapy Evaluation Patient Details Name: Shawn Torres MRN: [id number] DOB: [birth year] Today's Date: 10/16/2021   History of Present Illness 55 y.o. male with h/o decompensated alcoholic cirrhosis with portal hypertension and ascites presenting with encephalopathy in the setting of hyperammonemia.  Intubated 11/21-23.   Clinical Impression   Pt seen for OT evaluation this date in setting of acute hospitalization d/t encephalopathy requiring intubation. Pt presents this date with improved mentation and able to follow all commands. He reports he is usually INDEP with self care at baseline and requires no AD for fxl mobility. He presents this date close to his functional baseline, but with notable balance and strength deficits. Pt requires CGA/SUPV with RW for fxl mobility and benefits from the UE support for ADL transfers. He is able to perform LB ADLs with increased time although it is admittedly a struggle given his abdominal distention. Pt returned to bed with all needs met and in reach. Will continue to follow acutely. Recommend pt increase mobility and ADL participation in the home setting with family stand-by assistance to improve safety and INDEP.       Recommendations for follow up therapy are one component of a multi-disciplinary discharge planning process, led by the attending physician.  Recommendations may be updated based on patient status, additional functional criteria and insurance authorization.   Follow Up Recommendations  Other (comment) (pt aware about the resource of the [facility] in the community, but also aware that he could improve balance and mobility wtih improved nutrition)    Assistance Recommended at Discharge Frequent or constant Supervision/Assistance  Functional Status Assessment  Patient has had a recent decline in their functional status and demonstrates the ability to make significant improvements in function in a reasonable and predictable amount of  time.  Equipment Recommendations  BSC/3in1;Other (comment) (2ww)    Recommendations for Other Services       Precautions / Restrictions Precautions Precautions: Fall Restrictions Weight Bearing Restrictions: No      Mobility Bed Mobility Overal bed mobility: Independent             General bed mobility comments: increased time, HOB slightly elevated    Transfers Overall transfer level: Needs assistance Equipment used: Rolling walker (2 wheels) Transfers: Sit to/from Stand Sit to Stand: Supervision;Min guard           General transfer comment: increased time, cues for safe use of RW.      Balance Overall balance assessment: Needs assistance Sitting-balance support: No upper extremity supported Sitting balance-Leahy Scale: Good     Standing balance support: Bilateral upper extremity supported;No upper extremity supported Standing balance-Leahy Scale: Fair Standing balance comment: benefits from UE support for more dynamic tasks and fxl moblity                           ADL either performed or assessed with clinical judgement   ADL                                         General ADL Comments: Pt requires SETUP for seated UB/LB ADLs, CGA with RW for standing ADLs that require dynamic reaching such as peri care.     Vision Patient Visual Report: No change from baseline       Perception     Praxis      Pertinent Vitals/Pain Pain  Assessment: No/denies pain     Hand Dominance     Extremity/Trunk Assessment Upper Extremity Assessment Upper Extremity Assessment: Overall WFL for tasks assessed;Generalized weakness   Lower Extremity Assessment Lower Extremity Assessment: Overall WFL for tasks assessed;Generalized weakness       Communication Communication Communication: Prefers language other than English (ipad interpreter, Jana Half, ID#: [phone])   Cognition Arousal/Alertness: Awake/alert Behavior During Therapy: WFL  for tasks assessed/performed Overall Cognitive Status: Within Functional Limits for tasks assessed                                       General Comments       Exercises Other Exercises Other Exercises: OT engages pt in ed re: nutrition as it relates to strength and endurance to complete ADLs, safety considerations, use of RW.   Shoulder Instructions      Home Living Family/patient expects to be discharged to:: Private residence Living Arrangements: Children;Other relatives Available Help at Discharge: Available 24 hours/day (son-in-law apparently home nearly 24/7 and helps him out)   Home Access: Stairs to enter Entrance Stairs-Number of Steps: 2   Home Layout: One level               Home Equipment: None          Prior Functioning/Environment Prior Level of Function : Independent/Modified Independent             Mobility Comments: able to be up and relatively active w/o AD ADLs Comments: reports he is able to do light meal prep and cleaning        OT Problem List: Decreased strength;Decreased activity tolerance;Impaired balance (sitting and/or standing);Decreased knowledge of use of DME or AE      OT Treatment/Interventions: Self-care/ADL training;Therapeutic exercise;DME and/or AE instruction;Therapeutic activities;Patient/family education    OT Goals(Current goals can be found in the care plan section) Acute Rehab OT Goals Patient Stated Goal: to go home OT Goal Formulation: With patient Time For Goal Achievement: 10/30/21 Potential to Achieve Goals: Good ADL Goals Pt Will Transfer to Toilet: ambulating;with modified independence Pt/caregiver will Perform Home Exercise Program: Increased strength;Both right and left upper extremity;With Supervision  OT Frequency: Min 2X/week   Barriers to D/C:            Co-evaluation              AM-PAC OT "6 Clicks" Daily Activity     Outcome Measure Help from another person eating  meals?: None Help from another person taking care of personal grooming?: A Little Help from another person toileting, which includes using toliet, bedpan, or urinal?: A Lot Help from another person bathing (including washing, rinsing, drying)?: A Lot Help from another person to put on and taking off regular upper body clothing?: A Little Help from another person to put on and taking off regular lower body clothing?: A Lot 6 Click Score: 16   End of Session Equipment Utilized During Treatment: Rolling walker (2 wheels) Nurse Communication: Mobility status  Activity Tolerance: Patient tolerated treatment well Patient left: in bed;with call bell/phone within reach;with bed alarm set  OT Visit Diagnosis: Unsteadiness on feet (R26.81);Other abnormalities of gait and mobility (R26.89);Muscle weakness (generalized) (M62.81)                Time: 2841-3244 OT Time Calculation (min): 23 min Charges:  OT General Charges $OT Visit: 1 Visit OT Evaluation $OT  Eval Low Complexity: 1 Low OT Treatments $Self Care/Home Management : 8-22 mins  Gerrianne Scale, MS, OTR/L ascom [phone] 10/16/21, 2:07 PM

## 2021-10-16 NOTE — TOC Transition Note (Signed)
Transition of Care [facility]) - CM/SW Discharge Note   Patient Details  Name: Shawn Torres MRN: [id number] Date of Birth: February 15, 1966  [facility] CM/SW Contact:  Maud Deed, LCSW Phone Number: 10/16/2021, 12:38 PM   Clinical Narrative:    CSW provided pt with RW and ODC application.     Barriers to Discharge: Continued Medical Work up   Patient Goals and CMS Choice Patient states their goals for this hospitalization and ongoing recovery are:: Patient currently intubated sedation off - family at the bedside      Discharge Placement                       Discharge Plan and Services In-house Referral: Interpreting Services, Artist Discharge Planning Services: CM Consult, Medication Assistance            DME Arranged: N/A DME Agency: NA       HH Arranged: NA          Social Determinants of Health (SDOH) Interventions     Readmission Risk Interventions Readmission Risk Prevention Plan 10/12/2021  Transportation Screening Complete  PCP or Specialist Appt within 3-5 Days Complete  HRI or Home Care Consult Not Complete  HRI or Home Care Consult comments unsure of discharge needs  Social Work Consult for Recovery Care Planning/Counseling Complete  Palliative Care Screening Not Applicable  Medication Review Oceanographer) Complete

## 2021-10-16 NOTE — Discharge Summary (Signed)
Physician Discharge Summary  Shawn Torres PHX:[id number] DOB: Nov 15, 1966 DOA: 10/10/2021  PCP: Pcp, No  Admit date: 10/10/2021 Discharge date: 10/16/2021  Admitted From: Home Disposition: relatives home  Recommendations for Outpatient Follow-up:  Follow up with PCP within 1-2 weeks for anemia monitoring and alcohol cessation further counseling. Recommend palliative referral. Follow up with GI for repeat EGD in 3-4 weeks as well as evaluation for further paracentesis and cirrhosis tx. (Referral placed on dc) Follow up with urology for urine retention (referral placed on dc)  Equipment/Devices:walker  Discharge Condition:stable, improved CODE STATUS:  Code Status: Full Code  Regular healthy diet  Brief/Interim Summary: Pt presented with hematemesis originating from esophageal varices in setting of heavy alcohol use/cirrhosis. He had hemodynamic instability requiring ICU stay with intubation and vasopressors. Intubation and EGD were performed on 11/20 and varices were banded. He was extubated on 11/23. He also received a paracentesis on that day which was negative for signs of SBP. He gradually improved with supportive care, treatment of alcohol withdrawal symptoms, blood transfusion, and octreotide. On day of discharge, he was able to tolerate a soft diet without any further nausea or vomiting or abdominal pain. He was counseled on consequences of drinking alcohol and he endorsed understanding and agreement to try to quit drinking alcohol. He was evaluated by PT who recommended a walker as patient has considerable physical deconditioning from his acute condition.   Additionally, patient experienced acute urinary retention likely in setting of acute illness and sedation side effects. He failed a voiding trial so another foley was placed that remained at time of discharge and patient was informed to follow up with urology for evaluation/removal.  Discharge Diagnoses:  Principal Problem:    Hematemesis Active Problems:   Secondary esophageal varices without bleeding ([place])   Secondary esophageal varices with bleeding (HCC)   Malnutrition of moderate degree   Abdominal distention   Acute respiratory failure with hypoxia (HCC)   Alcohol use disorder, severe, in early remission [facility])   Discharge Instructions     Ambulatory referral to Gastroenterology   Complete by: As directed    Ambulatory referral to Urology   Complete by: As directed       Allergies as of 10/16/2021   No Known Allergies      Medication List     STOP taking these medications    blood glucose meter kit and supplies Kit   dexamethasone 6 MG tablet Commonly known as: DECADRON   furosemide 20 MG tablet Commonly known as: Lasix   insulin glargine 100 unit/mL Sopn Commonly known as: LANTUS   metFORMIN 500 MG tablet Commonly known as: Glucophage   spironolactone 50 MG tablet Commonly known as: ALDACTONE       TAKE these medications    folic acid 1 MG tablet Commonly known as: FOLVITE Take 1 tablet by mouth daily.   lactulose 10 GM/15ML solution Commonly known as: CHRONULAC Take 30 mLs (20 g total) by mouth 3 (three) times daily. What changed: how much to take   multivitamin with minerals Tabs tablet Take 1 tablet by mouth daily. Start taking on: October 17, 2021   ondansetron 4 MG disintegrating tablet Commonly known as: ZOFRAN-ODT Take 1 tablet (4 mg total) by mouth every 8 (eight) hours as needed for nausea or vomiting.   pantoprazole 40 MG tablet Commonly known as: PROTONIX Take 1 tablet (40 mg total) by mouth daily. Start taking on: October 17, 2021   tamsulosin 0.4 MG Caps capsule Commonly known as:  FLOMAX Take 1 capsule (0.4 mg total) by mouth daily. Start taking on: October 17, 2021   thiamine 100 MG tablet Take 1 tablet (100 mg total) by mouth daily.               Durable Medical Equipment  (From admission, onward)           Start      Ordered   10/16/21 0949  For home use only DME Walker rolling  Once       Question Answer Comment  Walker: With [place] Wheels   Patient needs a walker to treat with the following condition Physical deconditioning      10/16/21 0948            No Known Allergies  Consultations: GI CCM  Procedures/Studies: CT HEAD WO CONTRAST (5MM)  Result Date: 10/10/2021 CLINICAL DATA:  Cerebral edema Hyperammonemia. EXAM: CT HEAD WITHOUT CONTRAST TECHNIQUE: Contiguous axial images were obtained from the base of the skull through the vertex without intravenous contrast. COMPARISON:  None. FINDINGS: Brain: There is no mass, hemorrhage or extra-axial collection. The size and configuration of the ventricles and extra-axial CSF spaces are normal. The brain parenchyma is normal, without acute or chronic infarction. Vascular: No abnormal hyperdensity of the major intracranial arteries or dural venous sinuses. No intracranial atherosclerosis. Skull: The visualized skull base, calvarium and extracranial soft tissues are normal. Sinuses/Orbits: No fluid levels or advanced mucosal thickening of the visualized paranasal sinuses. No mastoid or middle ear effusion. The orbits are normal. IMPRESSION: Normal head CT. Electronically Signed   By: Ulyses Jarred M.D.   On: 10/10/2021 18:54   US Paracentesis  Result Date: 10/13/2021 INDICATION: Ascites request received for diagnostic and therapeutic paracentesis, patient on vasopressor support. EXAM: ULTRASOUND GUIDED  PARACENTESIS MEDICATIONS: Local 1% lidocaine only. COMPLICATIONS: None immediate. PROCEDURE: Informed written consent was obtained from the patient after a discussion of the risks, benefits and alternatives to treatment. A timeout was performed prior to the initiation of the procedure. Initial ultrasound scanning demonstrates a large amount of ascites within the right lower abdominal quadrant. The right lower abdomen was prepped and draped in the usual  sterile fashion. 1% lidocaine was used for local anesthesia. Following this, a 19 gauge, 7-cm, Yueh catheter was introduced. An ultrasound image was saved for documentation purposes. The paracentesis was performed. The catheter was removed and a dressing was applied. The patient tolerated the procedure well without immediate post procedural complication. A total of 5 L was removed there is additional ascites that remains however patient is requiring vasopressor support at this time so decision was made to only remove 5 Liters. FINDINGS: A total of approximately 5 L of clear yellow fluid was removed. Samples were sent to the laboratory as requested by the clinical team. IMPRESSION: Successful ultrasound-guided paracentesis yielding 5 liters of peritoneal fluid. Read By: Tsosie Billing PA-C Electronically Signed   By: Albin Felling M.D.   On: 10/13/2021 13:57   DG Chest Port 1 View  Result Date: 10/13/2021 CLINICAL DATA:  Acute respiratory failure with hypoxia (HCC) J96.01 (ICD-10-CM) EXAM: PORTABLE CHEST 1 VIEW COMPARISON:  October 10, 2021. FINDINGS: Endotracheal tube tip at the level of the clavicular heads, unchanged. Left IJ central venous catheter with the tip projecting at the superior cavoatrial junction, unchanged. Low lung volumes with similar streaky bilateral lung opacities. No visible pleural effusions or pneumothorax on this semi erect radiograph. IMPRESSION: 1. Stable support devices. 2. Low lung volumes with similar  streaky bilateral opacities, most likely atelectasis. Electronically Signed   By: Margaretha Sheffield M.D.   On: 10/13/2021 07:58   DG Chest Port 1 View  Result Date: 10/10/2021 CLINICAL DATA:  Check central line placement EXAM: PORTABLE CHEST 1 VIEW COMPARISON:  Film from earlier in the same day. FINDINGS: New left jugular central line is noted with catheter tip at the cavoatrial junction. No pneumothorax is noted. Cardiac shadow is stable. Endotracheal tube is. Lungs demonstrate  some patchy atelectatic changes bilaterally. No bony abnormality is noted. IMPRESSION: No pneumothorax following central line placement. Electronically Signed   By: Inez Catalina M.D.   On: 10/10/2021 23:56   DG Chest Port 1 View  Result Date: 10/10/2021 CLINICAL DATA:  Intubation EXAM: PORTABLE CHEST 1 VIEW COMPARISON:  Chest radiograph 04/12/2021 FINDINGS: The endotracheal tube tip is approximately 3.2 cm from the carina. The cardiomediastinal silhouette is stable. Lung volumes are low, with unchanged slight asymmetric elevation of the right hemidiaphragm. Patchy opacities in the left base may reflect infection or aspiration. There is no other focal consolidation. Linear opacities in the right midlung may reflect atelectasis or scar. There is no pulmonary edema. There is no pleural effusion or pneumothorax. There is no acute osseous abnormality. IMPRESSION: 1. Endotracheal tube in satisfactory position. 2. Patchy opacities in the left base could reflect aspiration or infection. Electronically Signed   By: Valetta Mole M.D.   On: 10/10/2021 18:54    Subjective: Patient denies any abdominal pain, nausea, vomiting, and no signs of bleeding. He feels that he can tolerate a regular diet well and would like to go home. He endorses understanding that he cannot drink alcohol any more.   Discharge Exam: Vitals:   10/16/21 0730 10/16/21 1222  BP: 110/68 107/73  Pulse: 77 74  Resp: 18 18  Temp: 98.7 F (37.1 C) 97.9 F (36.6 C)  SpO2: 98% 98%    General: Pt is alert, awake, not in acute distress Cardiovascular: RRR, S1/S2 +, no rubs, no gallops Respiratory: CTA bilaterally, no wheezing, no rhonchi Abdominal: Soft, NT, ND, bowel sounds + Extremities: no edema, no cyanosis  Labs: Basic Metabolic Panel: Recent Labs  Lab 10/12/21 0338 10/13/21 0442 10/14/21 0530 10/15/21 0251 10/16/21 0348  NA 137 135 135 135 133*  K 4.3 4.2 3.9 4.0 4.2  CL 114* 113* 112* 113* 110  CO2 18* 18* 18* 19* 18*   GLUCOSE 96 118* 154* 137* 111*  BUN 42* 42* 38* 31* 34*  CREATININE 1.70* 1.65* 1.52* 1.09 1.12  CALCIUM 7.9* 7.4* 7.4* 7.2* 7.4*  MG 1.8 1.8 1.8 1.9 1.9  PHOS 4.4 3.8 3.1 2.3* 2.7   CBC: Recent Labs  Lab 10/12/21 0338 10/13/21 0442 10/14/21 0530 10/15/21 0251 10/16/21 0348  WBC 6.4 8.5 6.3 8.1 7.9  HGB 9.2* 8.5* 8.4* 9.8* 10.0*  HCT 26.2* 24.7* 24.1* 27.2* 27.3*  MCV 104.0* 101.6* 100.4* 101.9* 103.0*  PLT 108* 91* 95* 107* 116*    Microbiology Recent Results (from the past 240 hour(s))  Resp Panel by RT-PCR (Flu A&B, Covid) Nasopharyngeal Swab     Status: None   Collection Time: 10/10/21 10:50 AM   Specimen: Nasopharyngeal Swab; Nasopharyngeal(NP) swabs in vial transport medium  Result Value Ref Range Status   SARS Coronavirus 2 by RT PCR NEGATIVE NEGATIVE Final    Comment: (NOTE) SARS-CoV-2 target nucleic acids are NOT DETECTED.  The SARS-CoV-2 RNA is generally detectable in upper respiratory specimens during the acute phase of infection. The lowest concentration  of SARS-CoV-2 viral copies this assay can detect is 138 copies/mL. A negative result does not preclude SARS-Cov-2 infection and should not be used as the sole basis for treatment or other patient management decisions. A negative result may occur with  improper specimen collection/handling, submission of specimen other than nasopharyngeal swab, presence of viral mutation(s) within the areas targeted by this assay, and inadequate number of viral copies(<138 copies/mL). A negative result must be combined with clinical observations, patient history, and epidemiological information. The expected result is Negative.  Fact Sheet for Patients:  EntrepreneurPulse.com.au  Fact Sheet for Healthcare Providers:  IncredibleEmployment.be  This test is no t yet approved or cleared by the [place] FDA and  has been authorized for detection and/or diagnosis of SARS-CoV-2 by FDA  under an Emergency Use Authorization (EUA). This EUA will remain  in effect (meaning this test can be used) for the duration of the COVID-19 declaration under Section 564(b)(1) of the Act, 21 U.S.C.section 360bbb-3(b)(1), unless the authorization is terminated  or revoked sooner.       Influenza A by PCR NEGATIVE NEGATIVE Final   Influenza B by PCR NEGATIVE NEGATIVE Final    Comment: (NOTE) The Xpert Xpress SARS-CoV-2/FLU/RSV plus assay is intended as an aid in the diagnosis of influenza from Nasopharyngeal swab specimens and should not be used as a sole basis for treatment. Nasal washings and aspirates are unacceptable for Xpert Xpress SARS-CoV-2/FLU/RSV testing.  Fact Sheet for Patients: EntrepreneurPulse.com.au  Fact Sheet for Healthcare Providers: IncredibleEmployment.be  This test is not yet approved or cleared by the [place] FDA and has been authorized for detection and/or diagnosis of SARS-CoV-2 by FDA under an Emergency Use Authorization (EUA). This EUA will remain in effect (meaning this test can be used) for the duration of the COVID-19 declaration under Section 564(b)(1) of the Act, 21 U.S.C. section 360bbb-3(b)(1), unless the authorization is terminated or revoked.  Performed at [facility], [address] [place], [place] [address]   MRSA Next Gen by PCR, Nasal     Status: None   Collection Time: 10/10/21  2:47 PM   Specimen: Nasal Mucosa; Nasal Swab  Result Value Ref Range Status   MRSA by PCR Next Gen NOT DETECTED NOT DETECTED Final    Comment: (NOTE) The GeneXpert MRSA Assay (FDA approved for NASAL specimens only), is one component of a comprehensive MRSA colonization surveillance program. It is not intended to diagnose MRSA infection nor to guide or monitor treatment for MRSA infections. Test performance is not FDA approved in patients less than 52 years old. Performed at [facility], [address], [place], [place] [address]   Body fluid culture w Gram Stain     Status: None (Preliminary result)   Collection Time: 10/13/21 11:41 AM   Specimen: PATH Cytology Peritoneal fluid  Result Value Ref Range Status   Specimen Description   Final    PERITONEAL Performed at [facility], [address] [place], [place] [address]    Special Requests NONE  Final   Gram Stain   Final    RARE WBC PRESENT, PREDOMINANTLY MONONUCLEAR NO ORGANISMS SEEN    Culture   Final    NO GROWTH 3 DAYS Performed at [facility], [place] [address] [place], [place] [address]    Report Status PENDING  Incomplete    Time coordinating discharge: Over 30 minutes  Richarda Osmond, MD  [facility] 10/16/2021, 2:15 PM Pager   If 7PM-7AM,  please contact night-coverage [email or web address] Password [email or web address]

## 2021-10-16 NOTE — Progress Notes (Signed)
Walker delivered at bedside. Attempts made to call patient's daughters for discharge teaching. No answer at either number and voicemail full.

## 2021-10-16 NOTE — Progress Notes (Signed)
Patient's daughter and son-in-law arrived to unit. Both family members expressed feeling "shocked" about discharge. Daughter made aware attempts to contact throughout the day had been made. Patient currently lives alone. Daughter concerned about his ability to be alone and care for himself/foley cath on discharge. Daughter willing to allow patient to move into her home temporarily to assist.  Discharge teaching regarding foley cath care provided to both daughter and son-in-law. Discharge instructions reviewed with both family members, as well, who voiced understanding.  Daughter and son-in-law will go get items needed and return later to pick up patient for discharge.

## 2021-10-17 LAB — BODY FLUID CULTURE W GRAM STAIN: Culture: NO GROWTH

## 2021-10-25 LAB — BLOOD GAS, VENOUS
Acid-base deficit: 7.1 mmol/L — ABNORMAL HIGH (ref 0.0–2.0)
Bicarbonate: 17.9 mmol/L — ABNORMAL LOW (ref 20.0–28.0)
O2 Saturation: 27.5 %
Patient temperature: 37
pCO2, Ven: 34 mmHg — ABNORMAL LOW (ref 44.0–60.0)
pH, Ven: 7.33 (ref 7.250–7.430)

## 2021-10-29 ENCOUNTER — Other Ambulatory Visit: Payer: Self-pay

## 2021-10-29 ENCOUNTER — Encounter: Payer: Self-pay | Admitting: Emergency Medicine

## 2021-10-29 ENCOUNTER — Emergency Department: Payer: Medicaid Other

## 2021-10-29 ENCOUNTER — Inpatient Hospital Stay
Admission: EM | Admit: 2021-10-29 | Discharge: 2021-11-03 | DRG: 442 | Disposition: A | Discharge status: Home / Self Care | Payer: Medicaid Other | Attending: Internal Medicine | Admitting: Internal Medicine

## 2021-10-29 DIAGNOSIS — R188 Other ascites: Secondary | ICD-10-CM

## 2021-10-29 DIAGNOSIS — R339 Retention of urine, unspecified: Secondary | ICD-10-CM | POA: Diagnosis present

## 2021-10-29 DIAGNOSIS — Z20822 Contact with and (suspected) exposure to covid-19: Secondary | ICD-10-CM | POA: Diagnosis present

## 2021-10-29 DIAGNOSIS — I8510 Secondary esophageal varices without bleeding: Secondary | ICD-10-CM | POA: Diagnosis present

## 2021-10-29 DIAGNOSIS — K7011 Alcoholic hepatitis with ascites: Secondary | ICD-10-CM

## 2021-10-29 DIAGNOSIS — K7682 Hepatic encephalopathy: Secondary | ICD-10-CM | POA: Diagnosis present

## 2021-10-29 DIAGNOSIS — D696 Thrombocytopenia, unspecified: Secondary | ICD-10-CM | POA: Diagnosis present

## 2021-10-29 DIAGNOSIS — D539 Nutritional anemia, unspecified: Secondary | ICD-10-CM | POA: Diagnosis present

## 2021-10-29 DIAGNOSIS — K766 Portal hypertension: Secondary | ICD-10-CM | POA: Diagnosis present

## 2021-10-29 DIAGNOSIS — Z6831 Body mass index (BMI) 31.0-31.9, adult: Secondary | ICD-10-CM | POA: Diagnosis not present

## 2021-10-29 DIAGNOSIS — Z91199 Patient's noncompliance with other medical treatment and regimen due to unspecified reason: Secondary | ICD-10-CM | POA: Diagnosis not present

## 2021-10-29 DIAGNOSIS — R140 Abdominal distension (gaseous): Secondary | ICD-10-CM | POA: Diagnosis present

## 2021-10-29 DIAGNOSIS — E119 Type 2 diabetes mellitus without complications: Secondary | ICD-10-CM | POA: Diagnosis present

## 2021-10-29 DIAGNOSIS — F1010 Alcohol abuse, uncomplicated: Secondary | ICD-10-CM | POA: Diagnosis present

## 2021-10-29 DIAGNOSIS — F1021 Alcohol dependence, in remission: Secondary | ICD-10-CM

## 2021-10-29 DIAGNOSIS — E669 Obesity, unspecified: Secondary | ICD-10-CM | POA: Diagnosis present

## 2021-10-29 DIAGNOSIS — D61818 Other pancytopenia: Secondary | ICD-10-CM | POA: Diagnosis present

## 2021-10-29 DIAGNOSIS — K7031 Alcoholic cirrhosis of liver with ascites: Secondary | ICD-10-CM | POA: Diagnosis present

## 2021-10-29 DIAGNOSIS — Z79899 Other long term (current) drug therapy: Secondary | ICD-10-CM

## 2021-10-29 DIAGNOSIS — N179 Acute kidney failure, unspecified: Secondary | ICD-10-CM | POA: Diagnosis present

## 2021-10-29 DIAGNOSIS — Z597 Insufficient social insurance and welfare support: Secondary | ICD-10-CM

## 2021-10-29 DIAGNOSIS — N182 Chronic kidney disease, stage 2 (mild): Secondary | ICD-10-CM | POA: Clinically undetermined

## 2021-10-29 DIAGNOSIS — R4182 Altered mental status, unspecified: Secondary | ICD-10-CM | POA: Diagnosis present

## 2021-10-29 DIAGNOSIS — K7030 Alcoholic cirrhosis of liver without ascites: Secondary | ICD-10-CM

## 2021-10-29 DIAGNOSIS — N1831 Chronic kidney disease, stage 3a: Secondary | ICD-10-CM | POA: Clinically undetermined

## 2021-10-29 HISTORY — DX: Alcoholic cirrhosis of liver without ascites: K70.30

## 2021-10-29 LAB — COMPREHENSIVE METABOLIC PANEL
ALT: 27 U/L (ref 0–44)
AST: 50 U/L — ABNORMAL HIGH (ref 15–41)
Albumin: 2.6 g/dL — ABNORMAL LOW (ref 3.5–5.0)
Alkaline Phosphatase: 113 U/L (ref 38–126)
Anion gap: 9 (ref 5–15)
BUN: 34 mg/dL — ABNORMAL HIGH (ref 6–20)
CO2: 16 mmol/L — ABNORMAL LOW (ref 22–32)
Calcium: 8.4 mg/dL — ABNORMAL LOW (ref 8.9–10.3)
Chloride: 106 mmol/L (ref 98–111)
Creatinine, Ser: 1.67 mg/dL — ABNORMAL HIGH (ref 0.61–1.24)
GFR, Estimated: 49 mL/min — ABNORMAL LOW (ref >60)
Glucose, Bld: 153 mg/dL — ABNORMAL HIGH (ref 70–99)
Potassium: 4.9 mmol/L (ref 3.5–5.1)
Sodium: 131 mmol/L — ABNORMAL LOW (ref 135–145)
Total Bilirubin: 3.5 mg/dL — ABNORMAL HIGH (ref 0.3–1.2)
Total Protein: 6.7 g/dL (ref 6.5–8.1)

## 2021-10-29 LAB — CBC
HCT: 27.7 % — ABNORMAL LOW (ref 39.0–52.0)
Hemoglobin: 9.4 g/dL — ABNORMAL LOW (ref 13.0–17.0)
MCH: 34.9 pg — ABNORMAL HIGH (ref 26.0–34.0)
MCHC: 33.9 g/dL (ref 30.0–36.0)
MCV: 103 fL — ABNORMAL HIGH (ref 80.0–100.0)
Platelets: 157 10*3/uL (ref 150–400)
RBC: 2.69 MIL/uL — ABNORMAL LOW (ref 4.22–5.81)
RDW: 14.6 % (ref 11.5–15.5)
WBC: 5.3 10*3/uL (ref 4.0–10.5)
nRBC: 0 % (ref 0.0–0.2)

## 2021-10-29 LAB — APTT: aPTT: 34 seconds (ref 24–36)

## 2021-10-29 LAB — RESP PANEL BY RT-PCR (FLU A&B, COVID) ARPGX2
Influenza A by PCR: NEGATIVE
Influenza B by PCR: NEGATIVE
SARS Coronavirus 2 by RT PCR: NEGATIVE

## 2021-10-29 LAB — AMMONIA: Ammonia: 231 umol/L — ABNORMAL HIGH (ref 9–35)

## 2021-10-29 LAB — PROTIME-INR
INR: 1.8 — ABNORMAL HIGH (ref 0.8–1.2)
Prothrombin Time: 21 seconds — ABNORMAL HIGH (ref 11.4–15.2)

## 2021-10-29 MED ORDER — THIAMINE HCL 100 MG PO TABS
100.0000 mg | ORAL_TABLET | Freq: Every day | ORAL | Status: DC
  Filled 2021-10-29: qty 1

## 2021-10-29 MED ORDER — LACTULOSE 10 GM/15ML PO SOLN
20.0000 g | Freq: Three times a day (TID) | ORAL | Status: DC
  Administered 2021-10-29 – 2021-11-01 (×8): 20 g via ORAL
  Filled 2021-10-29 (×9): qty 30

## 2021-10-29 MED ORDER — ONDANSETRON HCL 4 MG PO TABS: 4.0000 mg | ORAL_TABLET | Freq: Four times a day (QID) | ORAL | Status: DC | PRN

## 2021-10-29 MED ORDER — PANTOPRAZOLE SODIUM 40 MG PO TBEC
40.0000 mg | DELAYED_RELEASE_TABLET | Freq: Every day | ORAL | Status: DC
  Administered 2021-10-30 – 2021-11-03 (×5): 40 mg via ORAL
  Filled 2021-10-29 (×6): qty 1

## 2021-10-29 MED ORDER — FOLIC ACID 1 MG PO TABS
1.0000 mg | ORAL_TABLET | Freq: Every day | ORAL | Status: DC
  Administered 2021-10-31 – 2021-11-03 (×4): 1 mg via ORAL
  Filled 2021-10-29 (×5): qty 1

## 2021-10-29 MED ORDER — LACTULOSE ENEMA: 300.0000 mL | Freq: Two times a day (BID) | ORAL | Status: DC

## 2021-10-29 MED ORDER — LORAZEPAM 2 MG/ML IJ SOLN
2.0000 mg | Freq: Once | INTRAMUSCULAR | Status: AC
  Administered 2021-10-29: 2 mg via INTRAMUSCULAR
  Filled 2021-10-29: qty 1

## 2021-10-29 MED ORDER — SPIRONOLACTONE 25 MG PO TABS: 50.0000 mg | ORAL_TABLET | Freq: Every day | ORAL | Status: DC

## 2021-10-29 MED ORDER — LACTULOSE 10 GM/15ML PO SOLN
30.0000 g | Freq: Once | ORAL | Status: AC
  Administered 2021-10-29: 30 g via ORAL
  Filled 2021-10-29: qty 60

## 2021-10-29 MED ORDER — ADULT MULTIVITAMIN W/MINERALS CH
1.0000 | ORAL_TABLET | Freq: Every day | ORAL | Status: DC
  Filled 2021-10-29: qty 1

## 2021-10-29 MED ORDER — ONDANSETRON HCL 4 MG/2ML IJ SOLN
4.0000 mg | Freq: Four times a day (QID) | INTRAMUSCULAR | Status: DC | PRN
  Administered 2021-10-30: 4 mg via INTRAVENOUS
  Filled 2021-10-29: qty 2

## 2021-10-29 MED ORDER — FUROSEMIDE 40 MG PO TABS: 20.0000 mg | ORAL_TABLET | Freq: Every day | ORAL | Status: DC

## 2021-10-29 MED ORDER — TAMSULOSIN HCL 0.4 MG PO CAPS
0.4000 mg | ORAL_CAPSULE | Freq: Every day | ORAL | Status: DC
  Administered 2021-10-31 – 2021-11-03 (×4): 0.4 mg via ORAL
  Filled 2021-10-29 (×5): qty 1

## 2021-10-29 NOTE — ED Triage Notes (Signed)
Pt ems from home for AMS. Pt with hx cirrhosis. Per ems pt becomes confused when ammonia level increases. Per family pt has been taking lactulose as directed.

## 2021-10-29 NOTE — ED Notes (Signed)
Pt will not take meds, lactolose. Lactolose placed in mouth and pt spit out most of it.

## 2021-10-29 NOTE — ED Notes (Signed)
Pt agitated, AMS, unable to get bp, labs, monitor. Dr. Cyril Loosen notified and to bedside. See mar

## 2021-10-29 NOTE — H&P (Signed)
H&P:    Shawn Torres   [id number] DOB: 1966/09/26 DOA: 10/29/2021  PCP: Pcp, No  Chief Complaint: Altered mental status   History of Present Illness:    HPI: Shawn Torres is a 55 y.o. male with a past medical history of decompensated alcoholic cirrhosis with portal hypertension and ascites presenting with encephalopathy in the setting of hyperammonemia.  He presented from home after family reported confusion with altered mental status and decreased p.o. intake last few days. They report this happens when his ammonia level gets high. Reportedly he has been taking his lactulose as prescribed. He was initially agitated in the ED and given some ativan IM to obtain labs, etc. No reported hematemesis.  He was admitted last month for hematemesis with esophageal varices.  Underwent EGD with grade 3 varices noted and banding by GI.  Upon my evaluation he is still sedated and therefore no meaningful history can be obtained. No family present at bedside upon my evaluation. Nurse states daughter had to go to work and son stepped out to get something to eat. Vital signs are stable.   ED Course: Labs obtained showing an ammonia level of 231. CXR showed no acute findings. Cr of 1.6. Lactulose p.o. has been ordered but yet to be given since he is sedated. He was given Ativan 2mg  IM due to agitated on initial presentation in the ER to obtain labs, etc.      ROS:   14 point review of systems is negative except for what is mentioned above in the HPI.   Past Medical History:   Past Medical History:  Diagnosis Date   Cirrhosis, alcoholic ([place])     Past Surgical History:   Past Surgical History:  Procedure Laterality Date   ESOPHAGOGASTRODUODENOSCOPY N/A 10/10/2021   Procedure: ESOPHAGOGASTRODUODENOSCOPY (EGD);  Surgeon: [name], MD;  Location: [facility] ENDOSCOPY;  Service: Endoscopy;  Laterality: N/A;    Social History:   Social History   Socioeconomic History   Marital status:  Married    Spouse name: Not on file   Number of children: Not on file   Years of education: Not on file   Highest education level: Not on file  Occupational History   Not on file  Tobacco Use   Smoking status: Never   Smokeless tobacco: Never  Vaping Use   Vaping Use: Never used  Substance and Sexual Activity   Alcohol use: Yes   Drug use: Never   Sexual activity: Not on file  Other Topics Concern   Not on file  Social History Narrative   Not on file   Social Determinants of Health   Financial Resource Strain: Not on file  Food Insecurity: Not on file  Transportation Needs: Not on file  Physical Activity: Not on file  Stress: Not on file  Social Connections: Not on file  Intimate Partner Violence: Not on file    Allergies:   No Known Allergies  Family History:   Family History  Family history unknown: Yes     Current Medications:   Prior to Admission medications   Medication Sig Start Date End Date Taking? Authorizing Provider  folic acid (FOLVITE) 1 MG tablet Take 1 tablet by mouth daily. 09/03/21 09/03/22 Yes [provider]  furosemide (LASIX) 20 MG tablet Take 20 mg by mouth daily. 10/21/21  Yes [provider]  lactulose (CHRONULAC) 10 GM/15ML solution Take 30 mLs (20 g total) by mouth 3 (three) times daily. 10/16/21  Yes Ouida Sills,  Chelsey L, MD  Multiple Vitamin (MULTIVITAMIN WITH MINERALS) TABS tablet Take 1 tablet by mouth daily. 10/17/21 11/16/21 Yes Richarda Osmond, MD  pantoprazole (PROTONIX) 40 MG tablet Take 1 tablet (40 mg total) by mouth daily. 10/17/21 11/16/21 Yes Richarda Osmond, MD  spironolactone (ALDACTONE) 50 MG tablet Take 50 mg by mouth daily. 10/21/21  Yes [provider]  tamsulosin (FLOMAX) 0.4 MG CAPS capsule Take 1 capsule (0.4 mg total) by mouth daily. 10/17/21  Yes Richarda Osmond, MD  thiamine 100 MG tablet Take 1 tablet (100 mg total) by mouth daily. 10/16/21  Yes Richarda Osmond, MD   ondansetron (ZOFRAN-ODT) 4 MG disintegrating tablet Take 1 tablet (4 mg total) by mouth every 8 (eight) hours as needed for nausea or vomiting. 10/16/21   Richarda Osmond, MD     Physical Exam:   Vitals:   10/29/21 1330 10/29/21 1400 10/29/21 1430 10/29/21 1500  BP: 103/61 130/84 105/62 114/65  Pulse: 68 85 68 71  Resp: 15 17 14 15   Temp:      TempSrc:      SpO2: 100% 100% 100% 100%  Weight:      Height:         General:  Appears calm and comfortable and is in NAD Cardiovascular:  RRR, no m/r/g.  Respiratory:   CTA bilaterally with no wheezes/rales/rhonchi.  Normal respiratory effort. Abdomen:  soft, NT, moderately distended, NABS Skin:  no rash or induration seen on limited exam Musculoskeletal:  grossly normal tone BUE/BLE, good ROM, no bony abnormality Lower extremity:  No LE edema.  Limited foot exam with no ulcerations.  2+ distal pulses. Psychiatric:  sedated Neurologic:  sedated    Data Review:    Radiological Exams on Admission: Independently reviewed - see discussion in A/P where applicable  DG Chest Port 1 View  Result Date: 10/29/2021 CLINICAL DATA:  Altered mental status EXAM: PORTABLE CHEST 1 VIEW COMPARISON:  10/13/2021 FINDINGS: Cardiomegaly. Unchanged elevation of the right hemidiaphragm. No acute airspace opacity. The visualized skeletal structures are unremarkable. IMPRESSION: 1.  Cardiomegaly. 2. No acute abnormality of the lungs. Unchanged elevation of the right hemidiaphragm. Electronically Signed   By: Delanna Ahmadi M.D.   On: 10/29/2021 13:03     Labs on Admission: I have personally reviewed the available labs and imaging studies at the time of the admission.  Pertinent labs on Admission: INR 1.8, PTT 21, Hgb 9.4, Na 131, bicarb 16, BUN 34, Cr 1.67, BGL 153, AST 50, Tbili 3.5, ammonia 231     Assessment/Plan:   Hepatic encephalopathy: Start scheduled lactulose rectally vs p.o. and repeat ammonia level in the morning.  Start neurochecks.   Initial ammonia level 231. Consult GI. They are aware and will see patient tomorrow morning.  Abdominal ascites: Consider paracentesis.  Holding home lasix and aldactone due to the AKI.  Alcohol cirrhosis: Reportedly no longer drinks alcohol.  Hyperbilirubinemia: Likely secondary to above  AKI: Holding home lactulose and aldactone. Baseline Cr appears around 1.0. Recheck BMP in AM. Could be secondary to hepatorenal syndrome.  Obesity: BMI 31 per EMR.  No acute treatment.  Esophageal varices: Underwent EGD last month for hematemesis and had grade 3 varices with banding performed by GI.  No reported hematemesis by family currently. Monitor and follow H&H and INR.   Other information:    Level of Care: Medsurg DVT prophylaxis: SCDs Code Status: Full code Consults: None Admission status:  Inpatient   Leslee Home DO  [facility]   How to contact the [facility] Attending or Consulting provider 7A - 7P or covering provider during after hours 7P -7A, for this patient?  Check the care team in [facility] and look for a) attending/consulting [facility] provider listed and b) the [facility] team listed Log into [email or web address] and use [place]'s universal password to access. If you do not have the password, please contact the hospital operator. Locate the [facility] provider you are looking for under [facility] and page to a number that you can be directly reached. If you still have difficulty reaching the provider, please page the [facility] (Director on Call) for the Hospitalists listed on amion for assistance.   10/29/2021, 3:41 PM

## 2021-10-29 NOTE — ED Provider Notes (Signed)
[facility] Emergency Department Provider Note   ____________________________________________    I have reviewed the triage vital signs and the nursing notes.   HISTORY  Chief Complaint Altered Mental Status     HPI Shawn Torres is a 55 y.o. male with a history of liver cirrhosis and history of hepatic encephalopathy who presents with altered mental status.  Family reports patient is confused and altered with decreased p.o. intake, they report this happens when his ammonia level gets high.  Patient is unable to provide any history and is somewhat agitated  History reviewed. No pertinent past medical history.  Patient Active Problem List   Diagnosis Date Noted   Alcohol use disorder, severe, in early remission (HCC)    Malnutrition of moderate degree 10/15/2021   Abdominal distention    Acute respiratory failure with hypoxia (HCC)    Secondary esophageal varices with bleeding (HCC)    Hematemesis 10/10/2021   Hepatic encephalopathy    Secondary esophageal varices without bleeding (HCC)    Hyponatremia 05/24/2019   Pneumonia due to COVID-19 virus 05/24/2019   Transaminitis 05/24/2019   Alcohol dependence with uncomplicated withdrawal (HCC) 05/24/2019   Hypokalemia 05/24/2019   COVID-19 05/24/2019    Past Surgical History:  Procedure Laterality Date   ESOPHAGOGASTRODUODENOSCOPY N/A 10/10/2021   Procedure: ESOPHAGOGASTRODUODENOSCOPY (EGD);  Surgeon: Midge Minium, MD;  Location: [facility] ENDOSCOPY;  Service: Endoscopy;  Laterality: N/A;    Prior to Admission medications   Medication Sig Start Date End Date Taking? Authorizing Provider  folic acid (FOLVITE) 1 MG tablet Take 1 tablet by mouth daily. 09/03/21 09/03/22  [provider]  lactulose (CHRONULAC) 10 GM/15ML solution Take 30 mLs (20 g total) by mouth 3 (three) times daily. 10/16/21   Leeroy Bock, MD  Multiple Vitamin (MULTIVITAMIN WITH MINERALS) TABS tablet Take 1 tablet by  mouth daily. 10/17/21 11/16/21  Leeroy Bock, MD  ondansetron (ZOFRAN-ODT) 4 MG disintegrating tablet Take 1 tablet (4 mg total) by mouth every 8 (eight) hours as needed for nausea or vomiting. 10/16/21   Leeroy Bock, MD  pantoprazole (PROTONIX) 40 MG tablet Take 1 tablet (40 mg total) by mouth daily. 10/17/21 11/16/21  Leeroy Bock, MD  tamsulosin (FLOMAX) 0.4 MG CAPS capsule Take 1 capsule (0.4 mg total) by mouth daily. 10/17/21   Leeroy Bock, MD  thiamine 100 MG tablet Take 1 tablet (100 mg total) by mouth daily. 10/16/21   Leeroy Bock, MD     Allergies Patient has no known allergies.  Family History  Family history unknown: Yes    Social History Social History   Tobacco Use   Smoking status: Never   Smokeless tobacco: Never  Vaping Use   Vaping Use: Never used  Substance Use Topics   Alcohol use: Yes   Drug use: Never    Unable to obtain review of Systems due to altered mental status     ____________________________________________   PHYSICAL EXAM:  VITAL SIGNS: ED Triage Vitals  Enc Vitals Group     BP      Pulse      Resp      Temp      Temp src      SpO2      Weight      Height      Head Circumference      Peak Flow      Pain Score      Pain Loc  Pain Edu?      Excl. in GC?     Constitutional: Alert but disoriented, agitated  Eyes: Conjunctivae are normal.  PERRLA Head: Atraumatic. Nose: No congestion/rhinnorhea. Mouth/Throat: Mucous membranes are moist.    Cardiovascular: Normal rate, regular rhythm. Grossly normal heart sounds.  Good peripheral circulation. Respiratory: Normal respiratory effort.  No retractions. Lungs CTAB. Gastrointestinal: Soft and nontender.  Positive abdominal distention consistent with history of cirrhosis/ascites  Musculoskeletal: No lower extremity tenderness nor edema.  Warm and well perfused Neurologic:   No gross focal neurologic deficits are appreciated.  Skin:  Skin is  warm, dry and intact. No rash noted.   ____________________________________________   LABS (all labs ordered are listed, but only abnormal results are displayed)  Labs Reviewed  CBC - Abnormal; Notable for the following components:      Result Value   RBC 2.69 (*)    Hemoglobin 9.4 (*)    HCT 27.7 (*)    MCV 103.0 (*)    MCH 34.9 (*)    All other components within normal limits  COMPREHENSIVE METABOLIC PANEL - Abnormal; Notable for the following components:   Sodium 131 (*)    CO2 16 (*)    Glucose, Bld 153 (*)    BUN 34 (*)    Creatinine, Ser 1.67 (*)    Calcium 8.4 (*)    Albumin 2.6 (*)    AST 50 (*)    Total Bilirubin 3.5 (*)    GFR, Estimated 49 (*)    All other components within normal limits  AMMONIA - Abnormal; Notable for the following components:   Ammonia 231 (*)    All other components within normal limits  PROTIME-INR - Abnormal; Notable for the following components:   Prothrombin Time 21.0 (*)    INR 1.8 (*)    All other components within normal limits  RESP PANEL BY RT-PCR (FLU A&B, COVID) ARPGX2  APTT   ____________________________________________  EKG   ____________________________________________  RADIOLOGY  Chest x-ray reviewed by me, no acute abnormality ____________________________________________   PROCEDURES  Procedure(s) performed: No  Procedures   Critical Care performed: yes  CRITICAL CARE Performed by: Jene Every   Total critical care time: 30 minutes  Critical care time was exclusive of separately billable procedures and treating other patients.  Critical care was necessary to treat or prevent imminent or life-threatening deterioration.  Critical care was time spent personally by me on the following activities: development of treatment plan with patient and/or surrogate as well as nursing, discussions with consultants, evaluation of patient's response to treatment, examination of patient, obtaining history from  patient or surrogate, ordering and performing treatments and interventions, ordering and review of laboratory studies, ordering and review of radiographic studies, pulse oximetry and re-evaluation of patient's condition.  ____________________________________________   INITIAL IMPRESSION / ASSESSMENT AND PLAN / ED COURSE  Pertinent labs & imaging results that were available during my care of the patient were reviewed by me and considered in my medical decision making (see chart for details).   Patient with history of liver cirrhosis, here with altered mental status, most likely related to elevated ammonia.  We will need to give 2 mg of IM Ativan because of agitation to allow for IV, labs  Ammonia level is elevated at 231, hemoglobin is at his baseline, mild elevation of BUN and creatinine.  I have ordered lactulose, discussed with the hospitalist for admission    ____________________________________________   FINAL CLINICAL IMPRESSION(S) / ED DIAGNOSES  Final diagnoses:  Hepatic encephalopathy        Note:  This document was prepared using Dragon voice recognition software and may include unintentional dictation errors.    Jene Every, MD 10/29/21 1351

## 2021-10-30 DIAGNOSIS — K7031 Alcoholic cirrhosis of liver with ascites: Secondary | ICD-10-CM

## 2021-10-30 DIAGNOSIS — K7682 Hepatic encephalopathy: Principal | ICD-10-CM

## 2021-10-30 LAB — COMPREHENSIVE METABOLIC PANEL
ALT: 24 U/L (ref 0–44)
AST: 43 U/L — ABNORMAL HIGH (ref 15–41)
Albumin: 2.2 g/dL — ABNORMAL LOW (ref 3.5–5.0)
Alkaline Phosphatase: 101 U/L (ref 38–126)
Anion gap: 6 (ref 5–15)
BUN: 32 mg/dL — ABNORMAL HIGH (ref 6–20)
CO2: 19 mmol/L — ABNORMAL LOW (ref 22–32)
Calcium: 8.2 mg/dL — ABNORMAL LOW (ref 8.9–10.3)
Chloride: 111 mmol/L (ref 98–111)
Creatinine, Ser: 1.3 mg/dL — ABNORMAL HIGH (ref 0.61–1.24)
GFR, Estimated: >60 mL/min (ref >60)
Glucose, Bld: 84 mg/dL (ref 70–99)
Potassium: 4 mmol/L (ref 3.5–5.1)
Sodium: 136 mmol/L (ref 135–145)
Total Bilirubin: 4.6 mg/dL — ABNORMAL HIGH (ref 0.3–1.2)
Total Protein: 5.6 g/dL — ABNORMAL LOW (ref 6.5–8.1)

## 2021-10-30 LAB — CBC
HCT: 24.4 % — ABNORMAL LOW (ref 39.0–52.0)
Hemoglobin: 8.5 g/dL — ABNORMAL LOW (ref 13.0–17.0)
MCH: 34.7 pg — ABNORMAL HIGH (ref 26.0–34.0)
MCHC: 34.8 g/dL (ref 30.0–36.0)
MCV: 99.6 fL (ref 80.0–100.0)
Platelets: 133 10*3/uL — ABNORMAL LOW (ref 150–400)
RBC: 2.45 MIL/uL — ABNORMAL LOW (ref 4.22–5.81)
RDW: 14.3 % (ref 11.5–15.5)
WBC: 5.8 10*3/uL (ref 4.0–10.5)
nRBC: 0 % (ref 0.0–0.2)

## 2021-10-30 LAB — PROTIME-INR
INR: 2.1 — ABNORMAL HIGH (ref 0.8–1.2)
Prothrombin Time: 23.7 seconds — ABNORMAL HIGH (ref 11.4–15.2)

## 2021-10-30 LAB — FERRITIN: Ferritin: 96 ng/mL (ref 24–336)

## 2021-10-30 LAB — IRON AND TIBC
Iron: 115 ug/dL (ref 45–182)
Saturation Ratios: 96 % — ABNORMAL HIGH (ref 17.9–39.5)
TIBC: 120 ug/dL — ABNORMAL LOW (ref 250–450)
UIBC: 5 ug/dL

## 2021-10-30 LAB — VITAMIN B12: Vitamin B-12: 911 pg/mL (ref 180–914)

## 2021-10-30 LAB — AMMONIA: Ammonia: 84 umol/L — ABNORMAL HIGH (ref 9–35)

## 2021-10-30 LAB — FOLATE: Folate: 24 ng/mL (ref >5.9)

## 2021-10-30 LAB — APTT: aPTT: 41 seconds — ABNORMAL HIGH (ref 24–36)

## 2021-10-30 LAB — ETHANOL: Alcohol, Ethyl (B): <10 mg/dL (ref <10)

## 2021-10-30 MED ORDER — ALBUMIN HUMAN 25 % IV SOLN
50.0000 g | Freq: Once | INTRAVENOUS | Status: DC
  Filled 2021-10-30: qty 200

## 2021-10-30 MED ORDER — ADULT MULTIVITAMIN W/MINERALS CH
1.0000 | ORAL_TABLET | Freq: Every day | ORAL | Status: DC
  Administered 2021-10-31 – 2021-11-03 (×4): 1 via ORAL
  Filled 2021-10-30 (×4): qty 1

## 2021-10-30 MED ORDER — LORAZEPAM 2 MG/ML IJ SOLN: 1.0000 mg | INTRAMUSCULAR | Status: DC | PRN

## 2021-10-30 MED ORDER — RIFAXIMIN 550 MG PO TABS
550.0000 mg | ORAL_TABLET | Freq: Two times a day (BID) | ORAL | Status: DC
  Administered 2021-10-30 – 2021-11-03 (×8): 550 mg via ORAL
  Filled 2021-10-30 (×10): qty 1

## 2021-10-30 MED ORDER — THIAMINE HCL 100 MG PO TABS
100.0000 mg | ORAL_TABLET | Freq: Every day | ORAL | Status: DC
  Administered 2021-10-31 – 2021-11-03 (×4): 100 mg via ORAL
  Filled 2021-10-30 (×4): qty 1

## 2021-10-30 MED ORDER — FUROSEMIDE 40 MG PO TABS
40.0000 mg | ORAL_TABLET | Freq: Every day | ORAL | Status: DC
  Administered 2021-10-31 – 2021-11-03 (×4): 40 mg via ORAL
  Filled 2021-10-30 (×4): qty 1

## 2021-10-30 MED ORDER — FUROSEMIDE 20 MG PO TABS: 20.0000 mg | ORAL_TABLET | Freq: Every day | ORAL | Status: DC

## 2021-10-30 MED ORDER — THIAMINE HCL 100 MG/ML IJ SOLN: 100.0000 mg | Freq: Every day | INTRAMUSCULAR | Status: DC

## 2021-10-30 MED ORDER — SPIRONOLACTONE 25 MG PO TABS: 50.0000 mg | ORAL_TABLET | Freq: Every day | ORAL | Status: DC

## 2021-10-30 MED ORDER — LORAZEPAM 1 MG PO TABS: 1.0000 mg | ORAL_TABLET | ORAL | Status: DC | PRN

## 2021-10-30 MED ORDER — LACTULOSE ENEMA
300.0000 mL | Freq: Two times a day (BID) | ORAL | Status: DC
  Filled 2021-10-30 (×3): qty 300

## 2021-10-30 MED ORDER — FOLIC ACID 1 MG PO TABS: 1.0000 mg | ORAL_TABLET | Freq: Every day | ORAL | Status: DC

## 2021-10-30 MED ORDER — SPIRONOLACTONE 25 MG PO TABS
100.0000 mg | ORAL_TABLET | Freq: Every day | ORAL | Status: DC
  Administered 2021-10-31 – 2021-11-03 (×4): 100 mg via ORAL
  Filled 2021-10-30 (×4): qty 4

## 2021-10-30 NOTE — Consult Note (Signed)
Cephas Darby, MD [address]  [place]  [place], [place] [address]  Main: [phone]  Fax: [phone] Pager: [phone]   Consultation  Referring Provider:     No ref. provider found Primary Care Physician:  Pcp, No Primary Gastroenterologist:  Dr. [name]         Reason for Consultation:     Encephalopathy, abdominal distention  Date of Admission:  10/29/2021 Date of Consultation:  10/30/2021         HPI:   Shawn Torres is a 55 y.o. male with alcoholic decompensated cirrhosis manifested as hepatic encephalopathy, ascites and esophageal variceal bleeding s/p ligation in 09/2021 when he was admitted with hepatic encephalopathy and upper GI bleed.  Patient is admitted with recurrence of ascites and worsening of hepatic encephalopathy.  Patient speaks Spanish, his daughter is bedside who can speak [place].  She reported that patient has not been drinking alcohol for last 7 months.  He is not taking any diuretics at home other than lactulose.  Patient denies any abdominal pain, nausea, vomiting, melena or rectal bleeding.  He had large-volume paracentesis on 11/23, 5 L removed.  His abdominal distention has been progressively worsening.  Patient's daughter reports that he has not been consuming any type of salt.  He is n.p.o. for paracentesis today Patient is started on lactulose, he is alert and oriented today.  Patient is hungry and he wants to eat   NSAIDs: None  Antiplts/Anticoagulants/Anti thrombotics: None  GI Procedures:  Upper endoscopy 10/10/2021 Grade III varices with stigmata of recent bleeding were found in the lower third of the esophagus,.Red wale signs were present. Four bands were successfully placed. There was no bleeding at the end of the procedure. The stomach was normal. A single localized erosion without bleeding was found in the duodenal bulb.  Past Medical History:  Diagnosis Date  . Cirrhosis, alcoholic ([place])     Past Surgical History:   Procedure Laterality Date  . ESOPHAGOGASTRODUODENOSCOPY N/A 10/10/2021   Procedure: ESOPHAGOGASTRODUODENOSCOPY (EGD);  Surgeon: [name], MD;  Location: [facility] ENDOSCOPY;  Service: Endoscopy;  Laterality: N/A;    Prior to Admission medications   Medication Sig Start Date End Date Taking? Authorizing Provider  folic acid (FOLVITE) 1 MG tablet Take 1 tablet by mouth daily. 09/03/21 09/03/22 Yes [provider]  furosemide (LASIX) 20 MG tablet Take 20 mg by mouth daily. 10/21/21  Yes [provider]  lactulose (CHRONULAC) 10 GM/15ML solution Take 30 mLs (20 g total) by mouth 3 (three) times daily. 10/16/21  Yes Richarda Osmond, MD  Multiple Vitamin (MULTIVITAMIN WITH MINERALS) TABS tablet Take 1 tablet by mouth daily. 10/17/21 11/16/21 Yes Richarda Osmond, MD  pantoprazole (PROTONIX) 40 MG tablet Take 1 tablet (40 mg total) by mouth daily. 10/17/21 11/16/21 Yes Richarda Osmond, MD  spironolactone (ALDACTONE) 50 MG tablet Take 50 mg by mouth daily. 10/21/21  Yes [provider]  tamsulosin (FLOMAX) 0.4 MG CAPS capsule Take 1 capsule (0.4 mg total) by mouth daily. 10/17/21  Yes Richarda Osmond, MD  thiamine 100 MG tablet Take 1 tablet (100 mg total) by mouth daily. 10/16/21  Yes Richarda Osmond, MD  ondansetron (ZOFRAN-ODT) 4 MG disintegrating tablet Take 1 tablet (4 mg total) by mouth every 8 (eight) hours as needed for nausea or vomiting. 10/16/21   Richarda Osmond, MD   Current Facility-Administered Medications:  .  albumin human 25 % solution 50 g, 50 g, Intravenous,  Once, Lin Landsman, MD .  folic acid (FOLVITE) tablet 1 mg, 1 mg, Oral, Daily, Wouk, Ailene Rud, MD .  folic acid (FOLVITE) tablet 1 mg, 1 mg, Oral, Daily, Wouk, Ailene Rud, MD .  Derrill Memo ON 10/31/2021] furosemide (LASIX) tablet 40 mg, 40 mg, Oral, Daily, Dorrance Sellick, Tally Due, MD .  lactulose (CHRONULAC) 10 GM/15ML solution 20 g, 20 g, Oral, TID, Anwar, Shayan S, DO, 20 g  at 10/30/21 0902 .  lactulose (CHRONULAC) enema 200 gm, 300 mL, Rectal, BID, Detrick Dani, Tally Due, MD .  LORazepam (ATIVAN) tablet 1-4 mg, 1-4 mg, Oral, Q1H PRN **OR** LORazepam (ATIVAN) injection 1-4 mg, 1-4 mg, Intravenous, Q1H PRN, Wouk, Ailene Rud, MD .  multivitamin with minerals tablet 1 tablet, 1 tablet, Oral, Daily, Wouk, Ailene Rud, MD .  ondansetron [facility]) tablet 4 mg, 4 mg, Oral, Q6H PRN **OR** ondansetron (ZOFRAN) injection 4 mg, 4 mg, Intravenous, Q6H PRN, Anwar, Shayan S, DO .  pantoprazole (PROTONIX) EC tablet 40 mg, 40 mg, Oral, Daily, Anwar, Shayan S, DO, 40 mg at 10/30/21 0902 .  rifaximin (XIFAXAN) tablet 550 mg, 550 mg, Oral, BID, Alazne Quant, Tally Due, MD .  Derrill Memo ON 10/31/2021] spironolactone (ALDACTONE) tablet 100 mg, 100 mg, Oral, Daily, Lacie Landry, Tally Due, MD .  tamsulosin (FLOMAX) capsule 0.4 mg, 0.4 mg, Oral, Daily, Anwar, Shayan S, DO .  thiamine tablet 100 mg, 100 mg, Oral, Daily **OR** thiamine (B-1) injection 100 mg, 100 mg, Intravenous, Daily, Wouk, Ailene Rud, MD   Family History  Family history unknown: Yes     Social History   Tobacco Use  . Smoking status: Never  . Smokeless tobacco: Never  Vaping Use  . Vaping Use: Never used  Substance Use Topics  . Alcohol use: Yes  . Drug use: Never    Allergies as of 10/29/2021  . (No Known Allergies)    Review of Systems:    All systems reviewed and negative except where noted in HPI.   Physical Exam:  Vital signs in last 24 hours: Temp:  [96.9 F (36.1 C)-98.6 F (37 C)] 98.3 F (36.8 C) (12/10 0836) Pulse Rate:  [68-93] 78 (12/10 0836) Resp:  [12-21] 16 (12/10 0836) BP: (99-142)/(61-86) 141/86 (12/10 0836) SpO2:  [100 %] 100 % (12/10 0836) Weight:  [85 kg] 85 kg (12/09 1238)   General: Ill-appearing, generalized muscle wasting, cooperative in NAD Head:  Normocephalic and atraumatic, bitemporal wasting. Eyes: Positive icterus.   Conjunctiva pink. PERRLA. Ears:  Normal auditory  acuity. Neck:  Supple; no masses or thyroidomegaly Lungs: Respirations even and unlabored. Lungs clear to auscultation bilaterally.   No wheezes, crackles, or rhonchi.  Heart:  Regular rate and rhythm;  Without murmur, clicks, rubs or gallops Abdomen:  Soft, severely distended, not tense, dull to percussion, nontender. Normal bowel sounds. No appreciable masses or hepatomegaly.  No rebound or guarding.  Rectal:  Not performed. Msk:  Symmetrical without gross deformities.  Generalized weakness Extremities:  Without edema, cyanosis or clubbing. Neurologic:  Alert and oriented x2;  grossly normal neurologically. Skin:  Intact without significant lesions or rashes. Psych:  Alert and cooperative. Normal affect.  LAB RESULTS: CBC Latest Ref Rng & Units 10/30/2021 10/29/2021 10/16/2021  WBC 4.0 - 10.5 K/uL 5.8 5.3 7.9  Hemoglobin 13.0 - 17.0 g/dL 8.5(L) 9.4(L) 10.0(L)  Hematocrit 39.0 - 52.0 % 24.4(L) 27.7(L) 27.3(L)  Platelets 150 - 400 K/uL 133(L) 157 116(L)    BMET BMP Latest Ref Rng & Units 10/30/2021 10/29/2021 10/16/2021  Glucose 70 - 99 mg/dL 84 153(H) 111(H)  BUN 6 - 20 mg/dL 32(H) 34(H) 34(H)  Creatinine 0.61 - 1.24 mg/dL 1.30(H) 1.67(H) 1.12  Sodium 135 - 145 mmol/L 136 131(L) 133(L)  Potassium 3.5 - 5.1 mmol/L 4.0 4.9 4.2  Chloride 98 - 111 mmol/L 111 106 110  CO2 22 - 32 mmol/L 19(L) 16(L) 18(L)  Calcium 8.9 - 10.3 mg/dL 8.2(L) 8.4(L) 7.4(L)    LFT Hepatic Function Latest Ref Rng & Units 10/30/2021 10/29/2021 10/16/2021  Total Protein 6.5 - 8.1 g/dL 5.6(L) 6.7 5.1(L)  Albumin 3.5 - 5.0 g/dL 2.2(L) 2.6(L) 1.7(L)  AST 15 - 41 U/L 43(H) 50(H) 26  ALT 0 - 44 U/L '24 27 16  ' Alk Phosphatase 38 - 126 U/L 101 113 66  Total Bilirubin 0.3 - 1.2 mg/dL 4.6(H) 3.5(H) 2.8(H)  Bilirubin, Direct 0.0 - 0.2 mg/dL - - -     STUDIES: DG Chest Port 1 View  Result Date: 10/29/2021 CLINICAL DATA:  Altered mental status EXAM: PORTABLE CHEST 1 VIEW COMPARISON:  10/13/2021 FINDINGS:  Cardiomegaly. Unchanged elevation of the right hemidiaphragm. No acute airspace opacity. The visualized skeletal structures are unremarkable. IMPRESSION: 1.  Cardiomegaly. 2. No acute abnormality of the lungs. Unchanged elevation of the right hemidiaphragm. Electronically Signed   By: Delanna Ahmadi M.D.   On: 10/29/2021 13:03      Impression / Plan:   Shawn Torres is a 56 y.o. Hispanic male with decompensated alcoholic cirrhosis manifested as ascites, hepatic encephalopathy and esophageal variceal bleeding s/p ligation x4 in 09/2021 is admitted with worsening hepatic encephalopathy as well as worsening abdominal distention  Hepatic encephalopathy Slowly improving Continue lactulose 20 to 30 g 1-2 times daily, adjust the dose to have 2-3 soft bowel movements only to prevent AKI from diarrhea Recommend Xifaxan 550 mg twice daily long-term No known history of portal vein thrombosis based on ultrasound Dopplers in 6/22 Ascitic fluid analysis to rule out SBP, scheduled for paracentesis today  Recurrence of ascites Ascitic fluid analysis in the past consistent with portal hypertension, no evidence of SBP in the past Therapeutic paracentesis scheduled for today Recommend to administer albumin if more than 4 L of fluid removed, 8 to 10 g/L to be administered Recommend ascitic fluid analysis to rule out SBP Recommend to increase diuretics Lasix 40 mg daily and spironolactone 100 mg daily Monitor electrolytes and renal function closely to adjust the dose Strict low-sodium diet  Decompensated cirrhosis of liver MELD - Na 25, Childs C Portal hypertension manifested as thrombocytopenia, ascites, encephalopathy and varices Macrocytic anemia-check iron panel, B12 and folate levels, no evidence of active GI bleed at this time PSE: Manage as above HRS: None HCC: No evidence of liver lesions Patient has appointment [facility] hepatology on 11/19/2021  Thank you for involving me in the care of  this patient.  GI will follow along with you    LOS: 1 day   Sherri Sear, MD  10/30/2021, 11:31 AM    Note: This dictation was prepared with Dragon dictation along with smaller phrase technology. Any transcriptional errors that result from this process are unintentional.

## 2021-10-30 NOTE — Progress Notes (Addendum)
PROGRESS NOTE    Shawn Torres  [id number] DOB: Oct 31, 1966 DOA: 10/29/2021 PCP: Pcp, No  Outpatient Specialists: GI?    Brief Narrative:  From admission h and p Shawn Torres is a 55 y.o. male with a past medical history of decompensated alcoholic cirrhosis with portal hypertension and ascites presenting with encephalopathy in the setting of hyperammonemia. He presented from home after family reported confusion with altered mental status and decreased p.o. intake last few days. They report this happens when his ammonia level gets high. Reportedly he has been taking his lactulose as prescribed. He was initially agitated in the ED and given some ativan IM to obtain labs, etc. No reported hematemesis.  He was admitted last month for hematemesis with esophageal varices.  Underwent EGD with grade 3 varices noted and banding by GI. Upon my evaluation he is still sedated and therefore no meaningful history can be obtained. No family present at bedside upon my evaluation. Nurse states daughter had to go to work and son stepped out to get something to eat. Vital signs are stable.    Assessment & Plan:   Principal Problem:   Hepatic encephalopathy Active Problems:   Cirrhosis, alcoholic ([place])  # Hepatic encephalopathy Appears to be improving. Has not been compliant w/ lactulose per daughter. - continue lactulose, titrate to 2-3 BMs/day - cont rifaximin  # Alcohol abuse Says last drink was in June. Doesn't appear to be withdrawing.  - ciwa monitoring for now - mvi, thiamine, folate - rd consult  # Cirrhosis # Ascites Large volume paracentesis recent hospitalization no sbp. Here with very distended abdomen - therapeutic paracentesis ordered with basic studies, no signs sbp currently - resume home diuretics - GI has been consulted by admission provider  # Esophageal varices  With recent hospitalization for bleeding varices, s/p banding. No report of bleeding and hgb stable  - monitor  #  T2DM Now diet controlled - daily fasting sugar with daily bmp  # History urinary retention Foley placed at recent hospitalization has since been discontinued - monitor for recurrence - cont flomax  # AKI Mild, improved cr to 1.3 this morning - resume home diuretics, significant ascites likely contributing - monitor for hepatorenal    DVT prophylaxis: SCDs Code Status: full Family Communication: daughter updated telephonically 12/10  Level of care: Med-Surg Status is: Inpatient  Remains inpatient appropriate because: severity of illness        Consultants:  GI  Procedures: none  Antimicrobials:  none    Subjective: No complaints  Objective: Vitals:   10/29/21 2000 10/29/21 2035 10/29/21 2222 10/30/21 0836  BP: 133/76 133/78 134/73 (!) 141/86  Pulse: 91 93 77 78  Resp: 13 16 16 16   Temp:  98.1 F (36.7 C) 98.6 F (37 C) 98.3 F (36.8 C)  TempSrc:  Oral Oral Oral  SpO2: 100% 100% 100% 100%  Weight:      Height:        Intake/Output Summary (Last 24 hours) at 10/30/2021 0943 Last data filed at 10/29/2021 2359 Gross per 24 hour  Intake 0 ml  Output 0 ml  Net 0 ml   Filed Weights   10/29/21 1238  Weight: 85 kg    Examination:  General exam: Appears calm and comfortable. Chronically ill appearing.  Respiratory system: Clear to auscultation. Respiratory effort normal. Cardiovascular system: S1 & S2 heard, RRR. No JVD, murmurs, rubs, gallops or clicks. No pedal edema. Gastrointestinal system: distended, non-tender Central nervous system: Alert, oriented to self  and place Extremities: Symmetric 5 x 5 power. Skin: No rashes, lesions or ulcers Psychiatry: Judgement and insight appear normal. Mood & affect appropriate.     Data Reviewed: I have personally reviewed following labs and imaging studies  CBC: Recent Labs  Lab 10/29/21 1230 10/30/21 0647  WBC 5.3 5.8  HGB 9.4* 8.5*  HCT 27.7* 24.4*  MCV 103.0* 99.6  PLT 157 133*   Basic  Metabolic Panel: Recent Labs  Lab 10/29/21 1230 10/30/21 0647  NA 131* 136  K 4.9 4.0  CL 106 111  CO2 16* 19*  GLUCOSE 153* 84  BUN 34* 32*  CREATININE 1.67* 1.30*  CALCIUM 8.4* 8.2*   GFR: Estimated Creatinine Clearance: 65.9 mL/min (A) (by C-G formula based on SCr of 1.3 mg/dL (H)). Liver Function Tests: Recent Labs  Lab 10/29/21 1230 10/30/21 0647  AST 50* 43*  ALT 27 24  ALKPHOS 113 101  BILITOT 3.5* 4.6*  PROT 6.7 5.6*  ALBUMIN 2.6* 2.2*   No results for input(s): LIPASE, AMYLASE in the last 168 hours. Recent Labs  Lab 10/29/21 1230 10/30/21 0647  AMMONIA 231* 84*   Coagulation Profile: Recent Labs  Lab 10/29/21 1230 10/30/21 0647  INR 1.8* 2.1*   Cardiac Enzymes: No results for input(s): CKTOTAL, CKMB, CKMBINDEX, TROPONINI in the last 168 hours. BNP (last 3 results) No results for input(s): PROBNP in the last 8760 hours. HbA1C: No results for input(s): HGBA1C in the last 72 hours. CBG: No results for input(s): GLUCAP in the last 168 hours. Lipid Profile: No results for input(s): CHOL, HDL, LDLCALC, TRIG, CHOLHDL, LDLDIRECT in the last 72 hours. Thyroid Function Tests: No results for input(s): TSH, T4TOTAL, FREET4, T3FREE, THYROIDAB in the last 72 hours. Anemia Panel: No results for input(s): VITAMINB12, FOLATE, FERRITIN, TIBC, IRON, RETICCTPCT in the last 72 hours. Urine analysis:    Component Value Date/Time   COLORURINE AMBER (A) 04/28/2021 1736   APPEARANCEUR CLEAR (A) 04/28/2021 1736   LABSPEC 1.012 04/28/2021 1736   PHURINE 6.0 04/28/2021 1736   GLUCOSEU NEGATIVE 04/28/2021 1736   HGBUR MODERATE (A) 04/28/2021 1736   BILIRUBINUR NEGATIVE 04/28/2021 1736   KETONESUR NEGATIVE 04/28/2021 1736   PROTEINUR 30 (A) 04/28/2021 1736   NITRITE NEGATIVE 04/28/2021 1736   LEUKOCYTESUR NEGATIVE 04/28/2021 1736   Sepsis Labs: @LABRCNTIP (procalcitonin:4,lacticidven:4)  ) Recent Results (from the past 240 hour(s))  Resp Panel by RT-PCR (Flu A&B,  Covid) Nasopharyngeal Swab     Status: None   Collection Time: 10/29/21  2:59 PM   Specimen: Nasopharyngeal Swab; Nasopharyngeal(NP) swabs in vial transport medium  Result Value Ref Range Status   SARS Coronavirus 2 by RT PCR NEGATIVE NEGATIVE Final    Comment: (NOTE) SARS-CoV-2 target nucleic acids are NOT DETECTED.  The SARS-CoV-2 RNA is generally detectable in upper respiratory specimens during the acute phase of infection. The lowest concentration of SARS-CoV-2 viral copies this assay can detect is 138 copies/mL. A negative result does not preclude SARS-Cov-2 infection and should not be used as the sole basis for treatment or other patient management decisions. A negative result may occur with  improper specimen collection/handling, submission of specimen other than nasopharyngeal swab, presence of viral mutation(s) within the areas targeted by this assay, and inadequate number of viral copies(<138 copies/mL). A negative result must be combined with clinical observations, patient history, and epidemiological information. The expected result is Negative.  Fact Sheet for Patients:  14/09/22  Fact Sheet for Healthcare Providers:  BloggerCourse.com  This test is no  t yet approved or cleared by the [place] and  has been authorized for detection and/or diagnosis of SARS-CoV-2 by FDA under an Emergency Use Authorization (EUA). This EUA will remain  in effect (meaning this test can be used) for the duration of the COVID-19 declaration under Section 564(b)(1) of the Act, 21 U.S.C.section 360bbb-3(b)(1), unless the authorization is terminated  or revoked sooner.       Influenza A by PCR NEGATIVE NEGATIVE Final   Influenza B by PCR NEGATIVE NEGATIVE Final    Comment: (NOTE) The Xpert Xpress SARS-CoV-2/FLU/RSV plus assay is intended as an aid in the diagnosis of influenza from Nasopharyngeal swab specimens and should  not be used as a sole basis for treatment. Nasal washings and aspirates are unacceptable for Xpert Xpress SARS-CoV-2/FLU/RSV testing.  Fact Sheet for Patients: EntrepreneurPulse.com.au  Fact Sheet for Healthcare Providers: IncredibleEmployment.be  This test is not yet approved or cleared by the [place] FDA and has been authorized for detection and/or diagnosis of SARS-CoV-2 by FDA under an Emergency Use Authorization (EUA). This EUA will remain in effect (meaning this test can be used) for the duration of the COVID-19 declaration under Section 564(b)(1) of the Act, 21 U.S.C. section 360bbb-3(b)(1), unless the authorization is terminated or revoked.  Performed at [facility], [address], [place], [place] [address]          Radiology Studies: DG Chest [place] 1 View  Result Date: 10/29/2021 CLINICAL DATA:  Altered mental status EXAM: PORTABLE CHEST 1 VIEW COMPARISON:  10/13/2021 FINDINGS: Cardiomegaly. Unchanged elevation of the right hemidiaphragm. No acute airspace opacity. The visualized skeletal structures are unremarkable. IMPRESSION: 1.  Cardiomegaly. 2. No acute abnormality of the lungs. Unchanged elevation of the right hemidiaphragm. Electronically Signed   By: Delanna Ahmadi M.D.   On: 10/29/2021 13:03        Scheduled Meds:  folic acid  1 mg Oral Daily   lactulose  20 g Oral TID   lactulose  300 mL Rectal BID   multivitamin with minerals  1 tablet Oral Daily   pantoprazole  40 mg Oral Daily   rifaximin  550 mg Oral BID   tamsulosin  0.4 mg Oral Daily   thiamine  100 mg Oral Daily   Continuous Infusions:   LOS: 1 day    Time spent: 1 min    Desma Maxim, MD [facility]   If 7PM-7AM, please contact night-coverage [email or web address] Password [facility] 10/30/2021, 9:43 AM

## 2021-10-31 LAB — CBC
HCT: 22.9 % — ABNORMAL LOW (ref 39.0–52.0)
Hemoglobin: 8.2 g/dL — ABNORMAL LOW (ref 13.0–17.0)
MCH: 36.4 pg — ABNORMAL HIGH (ref 26.0–34.0)
MCHC: 35.8 g/dL (ref 30.0–36.0)
MCV: 101.8 fL — ABNORMAL HIGH (ref 80.0–100.0)
Platelets: 128 10*3/uL — ABNORMAL LOW (ref 150–400)
RBC: 2.25 MIL/uL — ABNORMAL LOW (ref 4.22–5.81)
RDW: 14 % (ref 11.5–15.5)
WBC: 5.6 10*3/uL (ref 4.0–10.5)
nRBC: 0 % (ref 0.0–0.2)

## 2021-10-31 LAB — COMPREHENSIVE METABOLIC PANEL
ALT: 23 U/L (ref 0–44)
AST: 38 U/L (ref 15–41)
Albumin: 2.1 g/dL — ABNORMAL LOW (ref 3.5–5.0)
Alkaline Phosphatase: 94 U/L (ref 38–126)
Anion gap: 7 (ref 5–15)
BUN: 30 mg/dL — ABNORMAL HIGH (ref 6–20)
CO2: 18 mmol/L — ABNORMAL LOW (ref 22–32)
Calcium: 8 mg/dL — ABNORMAL LOW (ref 8.9–10.3)
Chloride: 108 mmol/L (ref 98–111)
Creatinine, Ser: 1.17 mg/dL (ref 0.61–1.24)
GFR, Estimated: >60 mL/min (ref >60)
Glucose, Bld: 106 mg/dL — ABNORMAL HIGH (ref 70–99)
Potassium: 3.8 mmol/L (ref 3.5–5.1)
Sodium: 133 mmol/L — ABNORMAL LOW (ref 135–145)
Total Bilirubin: 3.7 mg/dL — ABNORMAL HIGH (ref 0.3–1.2)
Total Protein: 5.2 g/dL — ABNORMAL LOW (ref 6.5–8.1)

## 2021-10-31 LAB — BASIC METABOLIC PANEL
Anion gap: 6 (ref 5–15)
BUN: 31 mg/dL — ABNORMAL HIGH (ref 6–20)
CO2: 20 mmol/L — ABNORMAL LOW (ref 22–32)
Calcium: 7.8 mg/dL — ABNORMAL LOW (ref 8.9–10.3)
Chloride: 106 mmol/L (ref 98–111)
Creatinine, Ser: 1.4 mg/dL — ABNORMAL HIGH (ref 0.61–1.24)
GFR, Estimated: >60 mL/min (ref >60)
Glucose, Bld: 149 mg/dL — ABNORMAL HIGH (ref 70–99)
Potassium: 4.3 mmol/L (ref 3.5–5.1)
Sodium: 132 mmol/L — ABNORMAL LOW (ref 135–145)

## 2021-10-31 MED ORDER — PROSOURCE PLUS PO LIQD
30.0000 mL | Freq: Two times a day (BID) | ORAL | Status: DC
  Administered 2021-10-31 – 2021-11-03 (×6): 30 mL via ORAL
  Filled 2021-10-31 (×7): qty 30

## 2021-10-31 MED ORDER — GLUCERNA SHAKE PO LIQD: 237.0000 mL | Freq: Two times a day (BID) | ORAL | Status: DC

## 2021-10-31 MED ORDER — ENSURE ENLIVE PO LIQD
237.0000 mL | Freq: Two times a day (BID) | ORAL | Status: DC
  Administered 2021-10-31 – 2021-11-02 (×3): 237 mL via ORAL

## 2021-10-31 MED ORDER — FUROSEMIDE 10 MG/ML IJ SOLN
40.0000 mg | Freq: Once | INTRAMUSCULAR | Status: AC
Start: 2021-10-31 — End: 2021-10-31
  Administered 2021-10-31: 40 mg via INTRAVENOUS
  Filled 2021-10-31: qty 4

## 2021-10-31 NOTE — Plan of Care (Signed)
  Problem: Education: Goal: Knowledge of General Education information will improve Description: Including pain rating scale, medication(s)/side effects and non-pharmacologic comfort measures Outcome: Progressing   Problem: Health Behavior/Discharge Planning: Goal: Ability to manage health-related needs will improve Outcome: Progressing   Problem: Activity: Goal: Risk for activity intolerance will decrease Outcome: Progressing   Problem: Nutrition: Goal: Adequate nutrition will be maintained Outcome: Progressing   Problem: Elimination: Goal: Will not experience complications related to bowel motility Outcome: Progressing Goal: Will not experience complications related to urinary retention Outcome: Progressing   Problem: Pain Managment: Goal: General experience of comfort will improve Outcome: Progressing   Problem: Safety: Goal: Ability to remain free from injury will improve Outcome: Progressing   Problem: Skin Integrity: Goal: Risk for impaired skin integrity will decrease Outcome: Progressing   

## 2021-10-31 NOTE — Progress Notes (Signed)
Initial Nutrition Assessment RD working remotely.  DOCUMENTATION CODES:   Not applicable  INTERVENTION:  - will order Ensure Enlive BID, each supplement provides 350 kcal and 20 grams of protein. - will order 30 ml Prosource Plus BID, each supplement provides 100 kcal and 15 grams protein.  - complete NFPE when feasible.    NUTRITION DIAGNOSIS:   Increased nutrient needs related to acute illness, chronic illness as evidenced by estimated needs.  GOAL:   Patient will meet greater than or equal to 90% of their needs  MONITOR:   PO intake, Supplement acceptance, Labs, Weight trends  REASON FOR ASSESSMENT:   Malnutrition Screening Tool, Consult Assessment of nutrition requirement/status  ASSESSMENT:   55 y.o. male with a medical history of decompensated alcoholic cirrhosis with portal HTN and ascites. He presented to the ED from home with encephalopathy in the setting of hyperammonemia. His family reported confusion with AMS and decreased PO intake for a few days PTA. Family reported he has been taking lactulose as prescribed. He was admitted in November due to hematemesis with esophageal varices. He underwent banding of varices at that time.  Patient is noted to need a [place] interpreter. RD is working remotely. He was assessed in person by [facility] RDs on 10/11/21, 10/13/21, and 10/15/21. At that time he met criteria for moderate Malnutrition related to chronic illness (cirrhosis, etoh abuse) as evidenced by moderate fat depletion, moderate muscle depletion.  Suspect malnutrition diagnosis is ongoing, but unable to confirm at this time.   Diet advanced from NPO to 2 gram Na yesterday at 1700; he ate 75% of dinner last night and 100% of breakfast this AM.   Weight on 12/9 was 187 lb and weight on 10/16/21 was 181 lb. The most recently documented weight prior to that was 199 lb on 04/28/21. No information documented in the edema section of flow sheet this hospitalization.   Labs  reviewed; Na: 133 mmol/l, BUN: 30 mg/dl, Ca: 8 mg/dl, ammonia: 84 umol/l.  Medications reviewed; 1 mg folvite/day, 40 mg IV lasix x1 dose 12/11, 40 mg oral lasix/day started 12/11, 20 g lactulose TID, 1 tablet multivitamin with minerals/day, 40 mg oral protonid/day, 100 mg oral aldactone/day, 100 mg thiamine/day.     NUTRITION - FOCUSED PHYSICAL EXAM:  RD working remotely.  Diet Order:   Diet Order             Diet 2 gram sodium Room service appropriate? Yes; Fluid consistency: Thin  Diet effective now                   EDUCATION NEEDS:   Not appropriate for education at this time  Skin:  Skin Assessment: Reviewed RN Assessment  Last BM:  PTA/unknown  Height:   Ht Readings from Last 1 Encounters:  10/29/21 '5\' 5"'  (1.651 m)    Weight:   Wt Readings from Last 1 Encounters:  10/29/21 85 kg     Estimated Nutritional Needs:  Kcal:  2100-2300 kcal Protein:  105-115 grams Fluid:  >/= 1.5 L/day      Jarome Matin, MS, RD, LDN, CNSC Inpatient Clinical Dietitian RD pager # available in AMION  After hours/weekend pager # available in [facility]

## 2021-10-31 NOTE — Progress Notes (Signed)
Shawn Darby, MD [address]  [place]  [place], [place] [address]  Main: [phone]  Fax: [phone] Pager: [phone]   Subjective: No acute events overnight.  Patient did not undergo paracentesis yesterday due to unavailability.  Patient denies any abdominal pain.  He does have worsening of abdominal distention.  He denies any swelling of legs.   Objective: Vital signs in last 24 hours: Vitals:   10/30/21 1648 10/30/21 2016 10/31/21 0427 10/31/21 0814  BP: 127/65 127/84 129/90 99/76  Pulse: 69 87 71 85  Resp: 16 16 18 18   Temp: 98.6 F (37 C) (!) 97.3 F (36.3 C) 98.2 F (36.8 C) 98.4 F (36.9 C)  TempSrc:  Oral Oral Oral  SpO2:  100% 100% 100%  Weight:      Height:       Weight change:   Intake/Output Summary (Last 24 hours) at 10/31/2021 0949 Last data filed at 10/30/2021 2115 Gross per 24 hour  Intake 360 ml  Output --  Net 360 ml     Exam: Heart:: Regular rate and rhythm, S1S2 present, or without murmur or extra heart sounds Lungs: normal and clear to auscultation Abdomen: Severely distended, soft, not tense, dull to percussion, nontender   Lab Results: CBC Latest Ref Rng & Units 10/31/2021 10/30/2021 10/29/2021  WBC 4.0 - 10.5 K/uL 5.6 5.8 5.3  Hemoglobin 13.0 - 17.0 g/dL 8.2(L) 8.5(L) 9.4(L)  Hematocrit 39.0 - 52.0 % 22.9(L) 24.4(L) 27.7(L)  Platelets 150 - 400 K/uL 128(L) 133(L) 157   CMP Latest Ref Rng & Units 10/31/2021 10/30/2021 10/29/2021  Glucose 70 - 99 mg/dL 106(H) 84 153(H)  BUN 6 - 20 mg/dL 30(H) 32(H) 34(H)  Creatinine 0.61 - 1.24 mg/dL 1.17 1.30(H) 1.67(H)  Sodium 135 - 145 mmol/L 133(L) 136 131(L)  Potassium 3.5 - 5.1 mmol/L 3.8 4.0 4.9  Chloride 98 - 111 mmol/L 108 111 106  CO2 22 - 32 mmol/L 18(L) 19(L) 16(L)  Calcium 8.9 - 10.3 mg/dL 8.0(L) 8.2(L) 8.4(L)  Total Protein 6.5 - 8.1 g/dL 5.2(L) 5.6(L) 6.7  Total Bilirubin 0.3 - 1.2 mg/dL 3.7(H) 4.6(H) 3.5(H)  Alkaline Phos 38 - 126 U/L 94 101 113  AST 15 - 41  U/L 38 43(H) 50(H)  ALT 0 - 44 U/L 23 24 27     Micro Results: Recent Results (from the past 240 hour(s))  Resp Panel by RT-PCR (Flu A&B, Covid) Nasopharyngeal Swab     Status: None   Collection Time: 10/29/21  2:59 PM   Specimen: Nasopharyngeal Swab; Nasopharyngeal(NP) swabs in vial transport medium  Result Value Ref Range Status   SARS Coronavirus 2 by RT PCR NEGATIVE NEGATIVE Final    Comment: (NOTE) SARS-CoV-2 target nucleic acids are NOT DETECTED.  The SARS-CoV-2 RNA is generally detectable in upper respiratory specimens during the acute phase of infection. The lowest concentration of SARS-CoV-2 viral copies this assay can detect is 138 copies/mL. A negative result does not preclude SARS-Cov-2 infection and should not be used as the sole basis for treatment or other patient management decisions. A negative result may occur with  improper specimen collection/handling, submission of specimen other than nasopharyngeal swab, presence of viral mutation(s) within the areas targeted by this assay, and inadequate number of viral copies(<138 copies/mL). A negative result must be combined with clinical observations, patient history, and epidemiological information. The expected result is Negative.  Fact Sheet for Patients:  EntrepreneurPulse.com.au  Fact Sheet for Healthcare Providers:  IncredibleEmployment.be  This test is no  t yet approved or cleared by the [place] and  has been authorized for detection and/or diagnosis of SARS-CoV-2 by FDA under an Emergency Use Authorization (EUA). This EUA will remain  in effect (meaning this test can be used) for the duration of the COVID-19 declaration under Section 564(b)(1) of the Act, 21 U.S.C.section 360bbb-3(b)(1), unless the authorization is terminated  or revoked sooner.       Influenza A by PCR NEGATIVE NEGATIVE Final   Influenza B by PCR NEGATIVE NEGATIVE Final    Comment: (NOTE) The  Xpert Xpress SARS-CoV-2/FLU/RSV plus assay is intended as an aid in the diagnosis of influenza from Nasopharyngeal swab specimens and should not be used as a sole basis for treatment. Nasal washings and aspirates are unacceptable for Xpert Xpress SARS-CoV-2/FLU/RSV testing.  Fact Sheet for Patients: BloggerCourse.com  Fact Sheet for Healthcare Providers: SeriousBroker.it  This test is not yet approved or cleared by the [place] FDA and has been authorized for detection and/or diagnosis of SARS-CoV-2 by FDA under an Emergency Use Authorization (EUA). This EUA will remain in effect (meaning this test can be used) for the duration of the COVID-19 declaration under Section 564(b)(1) of the Act, 21 U.S.C. section 360bbb-3(b)(1), unless the authorization is terminated or revoked.  Performed at [facility], [address] [place] [place], [place] [address]    Studies/Results: [facility] 1 View  Result Date: 10/29/2021 CLINICAL DATA:  Altered mental status EXAM: PORTABLE CHEST 1 VIEW COMPARISON:  10/13/2021 FINDINGS: Cardiomegaly. Unchanged elevation of the right hemidiaphragm. No acute airspace opacity. The visualized skeletal structures are unremarkable. IMPRESSION: 1.  Cardiomegaly. 2. No acute abnormality of the lungs. Unchanged elevation of the right hemidiaphragm. Electronically Signed   By: Jearld Lesch M.D.   On: 10/29/2021 13:03   Medications: I have reviewed the patient's current medications. Prior to Admission:  Medications Prior to Admission  Medication Sig Dispense Refill Last Dose   folic acid (FOLVITE) 1 MG tablet Take 1 tablet by mouth daily.   10/28/2021   furosemide (LASIX) 20 MG tablet Take 20 mg by mouth daily.   10/28/2021   lactulose (CHRONULAC) 10 GM/15ML solution Take 30 mLs (20 g total) by mouth 3 (three) times daily. 236 mL 0 10/29/2021   Multiple Vitamin (MULTIVITAMIN WITH MINERALS) TABS tablet Take 1  tablet by mouth daily. 30 tablet 0 10/28/2021   pantoprazole (PROTONIX) 40 MG tablet Take 1 tablet (40 mg total) by mouth daily. 30 tablet 0 10/28/2021   spironolactone (ALDACTONE) 50 MG tablet Take 50 mg by mouth daily.   10/28/2021   tamsulosin (FLOMAX) 0.4 MG CAPS capsule Take 1 capsule (0.4 mg total) by mouth daily. 30 capsule 0 10/28/2021   thiamine 100 MG tablet Take 1 tablet (100 mg total) by mouth daily. 30 tablet 0 10/28/2021   ondansetron (ZOFRAN-ODT) 4 MG disintegrating tablet Take 1 tablet (4 mg total) by mouth every 8 (eight) hours as needed for nausea or vomiting. 20 tablet 0 prn at unknown   Scheduled:  folic acid  1 mg Oral Daily   furosemide  40 mg Intravenous Once   furosemide  40 mg Oral Daily   lactulose  20 g Oral TID   multivitamin with minerals  1 tablet Oral Daily   pantoprazole  40 mg Oral Daily   rifaximin  550 mg Oral BID   spironolactone  100 mg Oral Daily   tamsulosin  0.4 mg Oral Daily   thiamine  100 mg Oral  Daily   Or   thiamine  100 mg Intravenous Daily   Continuous: [id number] **OR** LORazepam, ondansetron **OR** ondansetron (ZOFRAN) IV Anti-infectives (From admission, onward)    Start     Dose/Rate Route Frequency Ordered Stop   10/30/21 1030  rifaximin (XIFAXAN) tablet 550 mg        550 mg Oral 2 times daily 10/30/21 0935        Scheduled Meds:  folic acid  1 mg Oral Daily   furosemide  40 mg Intravenous Once   furosemide  40 mg Oral Daily   lactulose  20 g Oral TID   multivitamin with minerals  1 tablet Oral Daily   pantoprazole  40 mg Oral Daily   rifaximin  550 mg Oral BID   spironolactone  100 mg Oral Daily   tamsulosin  0.4 mg Oral Daily   thiamine  100 mg Oral Daily   Or   thiamine  100 mg Intravenous Daily   Continuous Infusions: PRN Meds:.LORazepam **OR** LORazepam, ondansetron **OR** ondansetron (ZOFRAN) IV   Assessment: Principal Problem:   Hepatic encephalopathy Active Problems:   Cirrhosis, alcoholic (HCC)  Shawn Torres is a 56 y.o. Hispanic male with decompensated alcoholic cirrhosis manifested as ascites, hepatic encephalopathy and esophageal variceal bleeding s/p ligation x4 in 09/2021 is admitted with worsening hepatic encephalopathy as well as worsening abdominal distention  Plan:  Hepatic encephalopathy Continues to improve Continue lactulose 20 to 30 g 1-2 times daily, adjust the dose to have 2-3 soft bowel movements only to prevent AKI from diarrhea Continue Xifaxan 550 mg twice daily long-term No known history of portal vein thrombosis based on ultrasound Dopplers in 6/22 Recommend ascitic fluid analysis to rule out SBP  Recurrence of ascites Ascitic fluid analysis in the past consistent with portal hypertension, no evidence of SBP in the past Recommend therapeutic paracentesis  Recommend to administer albumin if more than 4 L of fluid removed, 8 to 10 g/L to be administered Recommend ascitic fluid analysis to rule out SBP Continue Lasix 40 mg daily and spironolactone 100 mg daily Give additional dose of Lasix 40 mg IV x1 today, check BMP tonight Monitor electrolytes and renal function closely to adjust the dose Strict low-sodium diet   Decompensated cirrhosis of liver MELD - Na 25, Childs C Portal hypertension manifested as thrombocytopenia, ascites, encephalopathy and varices 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  GI will follow along with you   LOS: 2 days   Shawn Torres 10/31/2021, 9:49 AM

## 2021-10-31 NOTE — Progress Notes (Signed)
PROGRESS NOTE    Shawn Torres  GGY:[id number] DOB: [birth year] DOA: 10/29/2021 PCP: Pcp, No  Outpatient Specialists: GI?    Brief Narrative:  From admission h and p Shawn Torres is a 55 y.o. male with a past medical history of decompensated alcoholic cirrhosis with portal hypertension and ascites presenting with encephalopathy in the setting of hyperammonemia. He presented from home after family reported confusion with altered mental status and decreased p.o. intake last few days. They report this happens when his ammonia level gets high. Reportedly he has been taking his lactulose as prescribed. He was initially agitated in the ED and given some ativan IM to obtain labs, etc. No reported hematemesis.  He was admitted last month for hematemesis with esophageal varices.  Underwent EGD with grade 3 varices noted and banding by GI. Upon my evaluation he is still sedated and therefore no meaningful history can be obtained. No family present at bedside upon my evaluation. Nurse states daughter had to go to work and son stepped out to get something to eat. Vital signs are stable.    Assessment & Plan:   Principal Problem:   Hepatic encephalopathy Active Problems:   Cirrhosis, alcoholic (HCC)  # Hepatic encephalopathy Appears to be improving. Has not been compliant w/ lactulose per daughter. - continue lactulose, titrate to 2-3 BMs/day - cont rifaximin  # Alcohol abuse Says last drink was in June. Doesn't appear to be withdrawing.  - ciwa monitoring for now - mvi, thiamine, folate  # Cirrhosis # Ascites Large volume paracentesis recent hospitalization no sbp. Here with very distended abdomen. Meld-na is 24 (~15% 90-day mortality). - therapeutic paracentesis ordered with basic studies, no signs sbp currently. Will need to wait until tomorrow as IR not available over the weekend - resume home diuretics, increased to spiro 100 and lasix 40 - GI following  # Esophageal varices  With recent  hospitalization for bleeding varices, s/p banding. No report of bleeding and hgb stable  - monitor  # T2DM Now diet controlled - daily fasting sugar with daily bmp  # History urinary retention Foley placed at recent hospitalization has since been discontinued. Urinating normally. - monitor for recurrence - cont flomax  # AKI Resolved    DVT prophylaxis: SCDs Code Status: full Family Communication: daughter updated telephonically 12/11  Level of care: Med-Surg Status is: Inpatient  Remains inpatient appropriate because: severity of illness        Consultants:  GI  Procedures: none  Antimicrobials:  none    Subjective: No complaints  Objective: Vitals:   10/30/21 1648 10/30/21 2016 10/31/21 0427 10/31/21 0814  BP: 127/65 127/84 129/90 99/76  Pulse: 69 87 71 85  Resp: 16 16 18 18   Temp: 98.6 F (37 C) (!) 97.3 F (36.3 C) 98.2 F (36.8 C) 98.4 F (36.9 C)  TempSrc:  Oral Oral Oral  SpO2:  100% 100% 100%  Weight:      Height:        Intake/Output Summary (Last 24 hours) at 10/31/2021 1256 Last data filed at 10/31/2021 1051 Gross per 24 hour  Intake 960 ml  Output --  Net 960 ml   Filed Weights   10/29/21 1238  Weight: 85 kg    Examination:  General exam: Appears calm and comfortable. Chronically ill appearing.  Respiratory system: Clear to auscultation. Respiratory effort normal. Cardiovascular system: S1 & S2 heard, RRR. No JVD, murmurs, rubs, gallops or clicks. No pedal edema. Gastrointestinal system: distended, non-tender Central nervous system:  Aaox3 Extremities: Symmetric 5 x 5 power. Skin: No rashes, lesions or ulcers Psychiatry: Judgement and insight appear normal. Mood & affect appropriate.     Data Reviewed: I have personally reviewed following labs and imaging studies  CBC: Recent Labs  Lab 10/29/21 1230 10/30/21 0647 10/31/21 0617  WBC 5.3 5.8 5.6  HGB 9.4* 8.5* 8.2*  HCT 27.7* 24.4* 22.9*  MCV 103.0* 99.6 101.8*   PLT 157 133* 128*   Basic Metabolic Panel: Recent Labs  Lab 10/29/21 1230 10/30/21 0647 10/31/21 0617  NA 131* 136 133*  K 4.9 4.0 3.8  CL 106 111 108  CO2 16* 19* 18*  GLUCOSE 153* 84 106*  BUN 34* 32* 30*  CREATININE 1.67* 1.30* 1.17  CALCIUM 8.4* 8.2* 8.0*   GFR: Estimated Creatinine Clearance: 73.2 mL/min (by C-G formula based on SCr of 1.17 mg/dL). Liver Function Tests: Recent Labs  Lab 10/29/21 1230 10/30/21 0647 10/31/21 0617  AST 50* 43* 38  ALT 27 24 23   ALKPHOS 113 101 94  BILITOT 3.5* 4.6* 3.7*  PROT 6.7 5.6* 5.2*  ALBUMIN 2.6* 2.2* 2.1*   No results for input(s): LIPASE, AMYLASE in the last 168 hours. Recent Labs  Lab 10/29/21 1230 10/30/21 0647  AMMONIA 231* 84*   Coagulation Profile: Recent Labs  Lab 10/29/21 1230 10/30/21 0647  INR 1.8* 2.1*   Cardiac Enzymes: No results for input(s): CKTOTAL, CKMB, CKMBINDEX, TROPONINI in the last 168 hours. BNP (last 3 results) No results for input(s): PROBNP in the last 8760 hours. HbA1C: No results for input(s): HGBA1C in the last 72 hours. CBG: No results for input(s): GLUCAP in the last 168 hours. Lipid Profile: No results for input(s): CHOL, HDL, LDLCALC, TRIG, CHOLHDL, LDLDIRECT in the last 72 hours. Thyroid Function Tests: No results for input(s): TSH, T4TOTAL, FREET4, T3FREE, THYROIDAB in the last 72 hours. Anemia Panel: Recent Labs    10/30/21 0945  VITAMINB12 911  FOLATE 24.0  FERRITIN 96  TIBC 120*  IRON 115   Urine analysis:    Component Value Date/Time   COLORURINE AMBER (A) 04/28/2021 1736   APPEARANCEUR CLEAR (A) 04/28/2021 1736   LABSPEC 1.012 04/28/2021 1736   PHURINE 6.0 04/28/2021 1736   GLUCOSEU NEGATIVE 04/28/2021 1736   HGBUR MODERATE (A) 04/28/2021 1736   BILIRUBINUR NEGATIVE 04/28/2021 1736   KETONESUR NEGATIVE 04/28/2021 1736   PROTEINUR 30 (A) 04/28/2021 1736   NITRITE NEGATIVE 04/28/2021 1736   LEUKOCYTESUR NEGATIVE 04/28/2021 1736   Sepsis  Labs: @LABRCNTIP (procalcitonin:4,lacticidven:4)  ) Recent Results (from the past 240 hour(s))  Resp Panel by RT-PCR (Flu A&B, Covid) Nasopharyngeal Swab     Status: None   Collection Time: 10/29/21  2:59 PM   Specimen: Nasopharyngeal Swab; Nasopharyngeal(NP) swabs in vial transport medium  Result Value Ref Range Status   SARS Coronavirus 2 by RT PCR NEGATIVE NEGATIVE Final    Comment: (NOTE) SARS-CoV-2 target nucleic acids are NOT DETECTED.  The SARS-CoV-2 RNA is generally detectable in upper respiratory specimens during the acute phase of infection. The lowest concentration of SARS-CoV-2 viral copies this assay can detect is 138 copies/mL. A negative result does not preclude SARS-Cov-2 infection and should not be used as the sole basis for treatment or other patient management decisions. A negative result may occur with  improper specimen collection/handling, submission of specimen other than nasopharyngeal swab, presence of viral mutation(s) within the areas targeted by this assay, and inadequate number of viral copies(<138 copies/mL). A negative result must be combined with clinical  observations, patient history, and epidemiological information. The expected result is Negative.  Fact Sheet for Patients:  EntrepreneurPulse.com.au  Fact Sheet for Healthcare Providers:  IncredibleEmployment.be  This test is no t yet approved or cleared by the [place] FDA and  has been authorized for detection and/or diagnosis of SARS-CoV-2 by FDA under an Emergency Use Authorization (EUA). This EUA will remain  in effect (meaning this test can be used) for the duration of the COVID-19 declaration under Section 564(b)(1) of the Act, 21 U.S.C.section 360bbb-3(b)(1), unless the authorization is terminated  or revoked sooner.       Influenza A by PCR NEGATIVE NEGATIVE Final   Influenza B by PCR NEGATIVE NEGATIVE Final    Comment: (NOTE) The Xpert  Xpress SARS-CoV-2/FLU/RSV plus assay is intended as an aid in the diagnosis of influenza from Nasopharyngeal swab specimens and should not be used as a sole basis for treatment. Nasal washings and aspirates are unacceptable for Xpert Xpress SARS-CoV-2/FLU/RSV testing.  Fact Sheet for Patients: EntrepreneurPulse.com.au  Fact Sheet for Healthcare Providers: IncredibleEmployment.be  This test is not yet approved or cleared by the [place] FDA and has been authorized for detection and/or diagnosis of SARS-CoV-2 by FDA under an Emergency Use Authorization (EUA). This EUA will remain in effect (meaning this test can be used) for the duration of the COVID-19 declaration under Section 564(b)(1) of the Act, 21 U.S.C. section 360bbb-3(b)(1), unless the authorization is terminated or revoked.  Performed at [facility], [address], [place], [place] [address]          Radiology Studies: No results found.      Scheduled Meds:  folic acid  1 mg Oral Daily   furosemide  40 mg Oral Daily   lactulose  20 g Oral TID   multivitamin with minerals  1 tablet Oral Daily   pantoprazole  40 mg Oral Daily   rifaximin  550 mg Oral BID   spironolactone  100 mg Oral Daily   tamsulosin  0.4 mg Oral Daily   thiamine  100 mg Oral Daily   Or   thiamine  100 mg Intravenous Daily   Continuous Infusions:   LOS: 2 days    Time spent: 19 min    Desma Maxim, MD [facility]   If 7PM-7AM, please contact night-coverage [email or web address] Password [facility] 10/31/2021, 12:56 PM

## 2021-11-01 ENCOUNTER — Inpatient Hospital Stay: Payer: Medicaid Other

## 2021-11-01 LAB — BODY FLUID CELL COUNT WITH DIFFERENTIAL
Eos, Fluid: 0 %
Lymphs, Fluid: 60 %
Monocyte-Macrophage-Serous Fluid: 36 %
Neutrophil Count, Fluid: 4 %
Total Nucleated Cell Count, Fluid: 42 cu mm

## 2021-11-01 LAB — COMPREHENSIVE METABOLIC PANEL
ALT: 23 U/L (ref 0–44)
AST: 40 U/L (ref 15–41)
Albumin: 2 g/dL — ABNORMAL LOW (ref 3.5–5.0)
Alkaline Phosphatase: 107 U/L (ref 38–126)
Anion gap: 6 (ref 5–15)
BUN: 32 mg/dL — ABNORMAL HIGH (ref 6–20)
CO2: 19 mmol/L — ABNORMAL LOW (ref 22–32)
Calcium: 7.8 mg/dL — ABNORMAL LOW (ref 8.9–10.3)
Chloride: 105 mmol/L (ref 98–111)
Creatinine, Ser: 1.49 mg/dL — ABNORMAL HIGH (ref 0.61–1.24)
GFR, Estimated: 56 mL/min — ABNORMAL LOW (ref >60)
Glucose, Bld: 111 mg/dL — ABNORMAL HIGH (ref 70–99)
Potassium: 4.2 mmol/L (ref 3.5–5.1)
Sodium: 130 mmol/L — ABNORMAL LOW (ref 135–145)
Total Bilirubin: 2.4 mg/dL — ABNORMAL HIGH (ref 0.3–1.2)
Total Protein: 5.5 g/dL — ABNORMAL LOW (ref 6.5–8.1)

## 2021-11-01 LAB — PATHOLOGIST SMEAR REVIEW

## 2021-11-01 LAB — CBC
HCT: 21.5 % — ABNORMAL LOW (ref 39.0–52.0)
Hemoglobin: 7.6 g/dL — ABNORMAL LOW (ref 13.0–17.0)
MCH: 35 pg — ABNORMAL HIGH (ref 26.0–34.0)
MCHC: 35.3 g/dL (ref 30.0–36.0)
MCV: 99.1 fL (ref 80.0–100.0)
Platelets: 124 10*3/uL — ABNORMAL LOW (ref 150–400)
RBC: 2.17 MIL/uL — ABNORMAL LOW (ref 4.22–5.81)
RDW: 14 % (ref 11.5–15.5)
WBC: 6.2 10*3/uL (ref 4.0–10.5)
nRBC: 0 % (ref 0.0–0.2)

## 2021-11-01 LAB — ALBUMIN, PLEURAL OR PERITONEAL FLUID: Albumin, Fluid: <1.5 g/dL

## 2021-11-01 LAB — PROTEIN, PLEURAL OR PERITONEAL FLUID: Total protein, fluid: <3 g/dL

## 2021-11-01 MED ORDER — ALBUMIN HUMAN 25 % IV SOLN
50.0000 g | Freq: Once | INTRAVENOUS | Status: AC
  Administered 2021-11-01: 50 g via INTRAVENOUS
  Filled 2021-11-01: qty 200

## 2021-11-01 MED ORDER — LACTULOSE 10 GM/15ML PO SOLN
20.0000 g | Freq: Two times a day (BID) | ORAL | Status: DC
  Administered 2021-11-01 – 2021-11-02 (×2): 20 g via ORAL
  Filled 2021-11-01 (×2): qty 30

## 2021-11-01 NOTE — Plan of Care (Signed)

## 2021-11-01 NOTE — Procedures (Signed)
PROCEDURE SUMMARY:  Successful US guided paracentesis from LLQ.  Yielded 8 liters of clear yellow fluid.  No immediate complications.  Pt tolerated well.   Specimen was sent for labs.  EBL < 67mL  Cloretta Ned 11/01/2021 10:47 AM

## 2021-11-01 NOTE — Progress Notes (Signed)
Arlyss Repress, MD [address]  [address]  [place], [place] [address]  Main: [phone]  Fax: [phone] Pager: [phone]   Subjective: No acute events overnight.  Patient underwent therapeutic paracentesis today, 8 L of clear yellow fluid was removed, patient denies any abdominal pain. He denies any swelling of legs.  His children are bedside.  Patient had 4 brown bowel movements yesterday, 1 today   Objective: Vital signs in last 24 hours: Vitals:   11/01/21 1013 11/01/21 1021 11/01/21 1036 11/01/21 1118  BP: 113/71 107/67 105/72 122/73  Pulse:    81  Resp:    18  Temp:    98.3 F (36.8 C)  TempSrc:    Oral  SpO2:    100%  Weight:      Height:       Weight change:   Intake/Output Summary (Last 24 hours) at 11/01/2021 1501 Last data filed at 11/01/2021 0700 Gross per 24 hour  Intake 0 ml  Output 0 ml  Net 0 ml     Exam: Heart:: Regular rate and rhythm, S1S2 present, or without murmur or extra heart sounds Lungs: normal and clear to auscultation Abdomen: Mildly distended, soft, nontender   Lab Results: CBC Latest Ref Rng & Units 11/01/2021 10/31/2021 10/30/2021  WBC 4.0 - 10.5 K/uL 6.2 5.6 5.8  Hemoglobin 13.0 - 17.0 g/dL 7.6(L) 8.2(L) 8.5(L)  Hematocrit 39.0 - 52.0 % 21.5(L) 22.9(L) 24.4(L)  Platelets 150 - 400 K/uL 124(L) 128(L) 133(L)   CMP Latest Ref Rng & Units 11/01/2021 10/31/2021 10/31/2021  Glucose 70 - 99 mg/dL 093(A) 355(D) 322(G)  BUN 6 - 20 mg/dL 25(K) 27(C) 62(B)  Creatinine 0.61 - 1.24 mg/dL 7.62(G) 3.15(V) 7.61  Sodium 135 - 145 mmol/L 130(L) 132(L) 133(L)  Potassium 3.5 - 5.1 mmol/L 4.2 4.3 3.8  Chloride 98 - 111 mmol/L 105 106 108  CO2 22 - 32 mmol/L 19(L) 20(L) 18(L)  Calcium 8.9 - 10.3 mg/dL 7.8(L) 7.8(L) 8.0(L)  Total Protein 6.5 - 8.1 g/dL 6.0(V) - 5.2(L)  Total Bilirubin 0.3 - 1.2 mg/dL 2.4(H) - 3.7(H)  Alkaline Phos 38 - 126 U/L 107 - 94  AST 15 - 41 U/L 40 - 38  ALT 0 - 44 U/L 23 - 23    Micro  Results: Recent Results (from the past 240 hour(s))  Resp Panel by RT-PCR (Flu A&B, Covid) Nasopharyngeal Swab     Status: None   Collection Time: 10/29/21  2:59 PM   Specimen: Nasopharyngeal Swab; Nasopharyngeal(NP) swabs in vial transport medium  Result Value Ref Range Status   SARS Coronavirus 2 by RT PCR NEGATIVE NEGATIVE Final    Comment: (NOTE) SARS-CoV-2 target nucleic acids are NOT DETECTED.  The SARS-CoV-2 RNA is generally detectable in upper respiratory specimens during the acute phase of infection. The lowest concentration of SARS-CoV-2 viral copies this assay can detect is 138 copies/mL. A negative result does not preclude SARS-Cov-2 infection and should not be used as the sole basis for treatment or other patient management decisions. A negative result may occur with  improper specimen collection/handling, submission of specimen other than nasopharyngeal swab, presence of viral mutation(s) within the areas targeted by this assay, and inadequate number of viral copies(<138 copies/mL). A negative result must be combined with clinical observations, patient history, and epidemiological information. The expected result is Negative.  Fact Sheet for Patients:  BloggerCourse.com  Fact Sheet for Healthcare Providers:  SeriousBroker.it  This test is no t yet approved or cleared  by the [place] and  has been authorized for detection and/or diagnosis of SARS-CoV-2 by FDA under an Emergency Use Authorization (EUA). This EUA will remain  in effect (meaning this test can be used) for the duration of the COVID-19 declaration under Section 564(b)(1) of the Act, 21 U.S.C.section 360bbb-3(b)(1), unless the authorization is terminated  or revoked sooner.       Influenza A by PCR NEGATIVE NEGATIVE Final   Influenza B by PCR NEGATIVE NEGATIVE Final    Comment: (NOTE) The Xpert Xpress SARS-CoV-2/FLU/RSV plus assay is intended  as an aid in the diagnosis of influenza from Nasopharyngeal swab specimens and should not be used as a sole basis for treatment. Nasal washings and aspirates are unacceptable for Xpert Xpress SARS-CoV-2/FLU/RSV testing.  Fact Sheet for Patients: EntrepreneurPulse.com.au  Fact Sheet for Healthcare Providers: IncredibleEmployment.be  This test is not yet approved or cleared by the [place] FDA and has been authorized for detection and/or diagnosis of SARS-CoV-2 by FDA under an Emergency Use Authorization (EUA). This EUA will remain in effect (meaning this test can be used) for the duration of the COVID-19 declaration under Section 564(b)(1) of the Act, 21 U.S.C. section 360bbb-3(b)(1), unless the authorization is terminated or revoked.  Performed at [facility], [address], [place], [place] [address]    Studies/Results: US Paracentesis  Result Date: 11/01/2021 INDICATION: Recurrent ascites request received for paracentesis. EXAM: ULTRASOUND GUIDED  PARACENTESIS MEDICATIONS: Local 1% lidocaine only. COMPLICATIONS: None immediate. PROCEDURE: Informed written consent was obtained from the patient after a discussion of the risks, benefits and alternatives to treatment. A timeout was performed prior to the initiation of the procedure. Initial ultrasound scanning demonstrates a large amount of ascites within the left lower abdominal quadrant. The left lower abdomen was prepped and draped in the usual sterile fashion. 1% lidocaine was used for local anesthesia. Following this, a 19 gauge, 7-cm, Yueh catheter was introduced. An ultrasound image was saved for documentation purposes. The paracentesis was performed. The catheter was removed and a dressing was applied. The patient tolerated the procedure well without immediate post procedural complication. FINDINGS: A total of approximately 8 L of clear yellow fluid was removed. Samples were sent  to the laboratory as requested by the clinical team. IMPRESSION: Successful ultrasound-guided paracentesis yielding 8 liters of peritoneal fluid. Read By: Tsosie Billing PA-C Electronically Signed   By: Aletta Edouard M.D.   On: 11/01/2021 10:48   Medications: I have reviewed the patient's current medications. Prior to Admission:  Medications Prior to Admission  Medication Sig Dispense Refill Last Dose   folic acid (FOLVITE) 1 MG tablet Take 1 tablet by mouth daily.   10/28/2021   furosemide (LASIX) 20 MG tablet Take 20 mg by mouth daily.   10/28/2021   lactulose (CHRONULAC) 10 GM/15ML solution Take 30 mLs (20 g total) by mouth 3 (three) times daily. 236 mL 0 10/29/2021   Multiple Vitamin (MULTIVITAMIN WITH MINERALS) TABS tablet Take 1 tablet by mouth daily. 30 tablet 0 10/28/2021   pantoprazole (PROTONIX) 40 MG tablet Take 1 tablet (40 mg total) by mouth daily. 30 tablet 0 10/28/2021   spironolactone (ALDACTONE) 50 MG tablet Take 50 mg by mouth daily.   10/28/2021   tamsulosin (FLOMAX) 0.4 MG CAPS capsule Take 1 capsule (0.4 mg total) by mouth daily. 30 capsule 0 10/28/2021   thiamine 100 MG tablet Take 1 tablet (100 mg total) by mouth daily. 30 tablet 0 10/28/2021   ondansetron (ZOFRAN-ODT) 4  MG disintegrating tablet Take 1 tablet (4 mg total) by mouth every 8 (eight) hours as needed for nausea or vomiting. 20 tablet 0 prn at unknown   Scheduled:  (feeding supplement) PROSource Plus  30 mL Oral BID BM   feeding supplement  237 mL Oral BID BM   folic acid  1 mg Oral Daily   furosemide  40 mg Oral Daily   lactulose  20 g Oral BID   multivitamin with minerals  1 tablet Oral Daily   pantoprazole  40 mg Oral Daily   rifaximin  550 mg Oral BID   spironolactone  100 mg Oral Daily   tamsulosin  0.4 mg Oral Daily   thiamine  100 mg Oral Daily   Or   thiamine  100 mg Intravenous Daily   Continuous:  albumin human     [id number] **OR** LORazepam, ondansetron **OR** ondansetron (ZOFRAN)  IV Anti-infectives (From admission, onward)    Start     Dose/Rate Route Frequency Ordered Stop   10/30/21 1030  rifaximin (XIFAXAN) tablet 550 mg        550 mg Oral 2 times daily 10/30/21 0935        Scheduled Meds:  (feeding supplement) PROSource Plus  30 mL Oral BID BM   feeding supplement  237 mL Oral BID BM   folic acid  1 mg Oral Daily   furosemide  40 mg Oral Daily   lactulose  20 g Oral BID   multivitamin with minerals  1 tablet Oral Daily   pantoprazole  40 mg Oral Daily   rifaximin  550 mg Oral BID   spironolactone  100 mg Oral Daily   tamsulosin  0.4 mg Oral Daily   thiamine  100 mg Oral Daily   Or   thiamine  100 mg Intravenous Daily   Continuous Infusions:  albumin human     PRN Meds:.LORazepam **OR** LORazepam, ondansetron **OR** ondansetron (ZOFRAN) IV   Assessment: Principal Problem:   Hepatic encephalopathy Active Problems:   Cirrhosis, alcoholic (HCC)  Harrison Vitalis is a 55 y.o. Hispanic male with decompensated alcoholic cirrhosis manifested as ascites, hepatic encephalopathy and esophageal variceal bleeding s/p ligation x4 in 09/2021 is admitted with worsening hepatic encephalopathy as well as worsening abdominal distention  Plan:  Hepatic encephalopathy: Currently resolved Continue lactulose 20 to 30 g 1-2 times daily, adjust the dose to have 2-3 soft bowel movements only to prevent AKI from diarrhea Continue Xifaxan 550 mg twice daily long-term No known history of portal vein thrombosis based on ultrasound Dopplers in 6/22 No evidence of SBP  Recurrence of ascites Ascitic fluid analysis in the past consistent with portal hypertension, no evidence of SBP in the past S/p therapeutic paracentesis, patient is requiring large-volume paracentesis frequently, unfortunately he is not a candidate for TIPS due to hepatic encephalopathy Recommend to administer albumin since more than 4 L of fluid removed, 8 to 10 g/L to be administered Continue Lasix 40 mg  daily and spironolactone 100 mg daily Monitor electrolytes and renal function closely to adjust the dose Strict low-sodium diet, dietitian consult placed   Decompensated cirrhosis of liver MELD - Na 25, Childs C Portal hypertension manifested as thrombocytopenia, ascites, encephalopathy and varices 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  GI will follow along with you   LOS: 3 days   Rumi Taras 11/01/2021, 3:01 PM

## 2021-11-01 NOTE — Progress Notes (Signed)
PROGRESS NOTE    Shawn Torres  VZD:[id number] DOB: 1966/09/29 DOA: 10/29/2021 PCP: Pcp, No  Outpatient Specialists: GI?    Brief Narrative:  From admission h and p Shawn Torres is a 55 y.o. male with a past medical history of decompensated alcoholic cirrhosis with portal hypertension and ascites presenting with encephalopathy in the setting of hyperammonemia. He presented from home after family reported confusion with altered mental status and decreased p.o. intake last few days. They report this happens when his ammonia level gets high. Reportedly he has been taking his lactulose as prescribed. He was initially agitated in the ED and given some ativan IM to obtain labs, etc. No reported hematemesis.  He was admitted last month for hematemesis with esophageal varices.  Underwent EGD with grade 3 varices noted and banding by GI. Upon my evaluation he is still sedated and therefore no meaningful history can be obtained. No family present at bedside upon my evaluation. Nurse states daughter had to go to work and son stepped out to get something to eat. Vital signs are stable.    Assessment & Plan:   Principal Problem:   Hepatic encephalopathy Active Problems:   Cirrhosis, alcoholic (HCC)  # Hepatic encephalopathy Appears to be improving. Has not been compliant w/ lactulose per daughter. - continue lactulose, titrate to 2-3 BMs/day - cont rifaximin  # Alcohol abuse Says last drink was in June. Doesn't appear to be withdrawing.  - ciwa monitoring for now - mvi, thiamine, folate  # Cirrhosis # Ascites Large volume paracentesis recent hospitalization no sbp. Here with very distended abdomen. Meld-na is 24 (~15% 90-day mortality). 8 L of fluid removed via IR paracentesis 12/12 - f/u fluid studies - GI following - home diuretics increased to 40/100  # Acute kidney injury Creatinine up-trending. 1.49 today from 1.17 1 day ago, was 1.67 on arrival. Possible hepatorenal - monitor  #  Esophageal varices  With recent hospitalization for bleeding varices, s/p banding. No report of bleeding and hgb stable though slight drop today to 7.6  - monitor  # T2DM Now diet controlled - daily fasting sugar with daily bmp  # History urinary retention Foley placed at recent hospitalization has since been discontinued. Urinating normally. - monitor for recurrence - cont flomax    DVT prophylaxis: SCDs Code Status: full Family Communication: daughter updated @ bedside 12/12  Level of care: Med-Surg Status is: Inpatient  Remains inpatient appropriate because: severity of illness   Consultants:  GI  Procedures: none  Antimicrobials:  none    Subjective: No complaints. Tolerated paracentesis. Tolerating diet.  Objective: Vitals:   11/01/21 1013 11/01/21 1021 11/01/21 1036 11/01/21 1118  BP: 113/71 107/67 105/72 122/73  Pulse:    81  Resp:    18  Temp:    98.3 F (36.8 C)  TempSrc:    Oral  SpO2:    100%  Weight:      Height:        Intake/Output Summary (Last 24 hours) at 11/01/2021 1133 Last data filed at 11/01/2021 0700 Gross per 24 hour  Intake 480 ml  Output 0 ml  Net 480 ml   Filed Weights   10/29/21 1238  Weight: 85 kg    Examination:  General exam: Appears calm and comfortable. Chronically ill appearing.  Respiratory system: Clear to auscultation. Respiratory effort normal. Cardiovascular system: S1 & S2 heard, RRR. No JVD, murmurs, rubs, gallops or clicks. No pedal edema. Gastrointestinal system: distended, non-tender. Improvement in distention from  yesterday. Central nervous system: Aaox3 Extremities: Symmetric 5 x 5 power. Skin: No rashes, lesions or ulcers Psychiatry: Judgement and insight appear normal. Mood & affect appropriate.     Data Reviewed: I have personally reviewed following labs and imaging studies  CBC: Recent Labs  Lab 10/29/21 1230 10/30/21 0647 10/31/21 0617 11/01/21 0441  WBC 5.3 5.8 5.6 6.2  HGB 9.4*  8.5* 8.2* 7.6*  HCT 27.7* 24.4* 22.9* 21.5*  MCV 103.0* 99.6 101.8* 99.1  PLT 157 133* 128* [id number]*   Basic Metabolic Panel: Recent Labs  Lab 10/29/21 1230 10/30/21 0647 10/31/21 0617 10/31/21 1924 11/01/21 0441  NA 131* 136 133* 132* 130*  K 4.9 4.0 3.8 4.3 4.2  CL 106 111 108 106 105  CO2 16* 19* 18* 20* 19*  GLUCOSE 153* 84 106* 149* 111*  BUN 34* 32* 30* 31* 32*  CREATININE 1.67* 1.30* 1.17 1.40* 1.49*  CALCIUM 8.4* 8.2* 8.0* 7.8* 7.8*   GFR: Estimated Creatinine Clearance: 57.5 mL/min (A) (by C-G formula based on SCr of 1.49 mg/dL (H)). Liver Function Tests: Recent Labs  Lab 10/29/21 1230 10/30/21 0647 10/31/21 0617 11/01/21 0441  AST 50* 43* 38 40  ALT 27 24 23 23   ALKPHOS 113 101 94 107  BILITOT 3.5* 4.6* 3.7* 2.4*  PROT 6.7 5.6* 5.2* 5.5*  ALBUMIN 2.6* 2.2* 2.1* 2.0*   No results for input(s): LIPASE, AMYLASE in the last 168 hours. Recent Labs  Lab 10/29/21 1230 10/30/21 0647  AMMONIA 231* 84*   Coagulation Profile: Recent Labs  Lab 10/29/21 1230 10/30/21 0647  INR 1.8* 2.1*   Cardiac Enzymes: No results for input(s): CKTOTAL, CKMB, CKMBINDEX, TROPONINI in the last 168 hours. BNP (last 3 results) No results for input(s): PROBNP in the last 8760 hours. HbA1C: No results for input(s): HGBA1C in the last 72 hours. CBG: No results for input(s): GLUCAP in the last 168 hours. Lipid Profile: No results for input(s): CHOL, HDL, LDLCALC, TRIG, CHOLHDL, LDLDIRECT in the last 72 hours. Thyroid Function Tests: No results for input(s): TSH, T4TOTAL, FREET4, T3FREE, THYROIDAB in the last 72 hours. Anemia Panel: Recent Labs    10/30/21 0945  VITAMINB12 911  FOLATE 24.0  FERRITIN 96  TIBC 120*  IRON 115   Urine analysis:    Component Value Date/Time   COLORURINE AMBER (A) 04/28/2021 1736   APPEARANCEUR CLEAR (A) 04/28/2021 1736   LABSPEC 1.012 04/28/2021 1736   PHURINE 6.0 04/28/2021 1736   GLUCOSEU NEGATIVE 04/28/2021 1736   HGBUR MODERATE (A)  04/28/2021 1736   BILIRUBINUR NEGATIVE 04/28/2021 1736   KETONESUR NEGATIVE 04/28/2021 1736   PROTEINUR 30 (A) 04/28/2021 1736   NITRITE NEGATIVE 04/28/2021 1736   LEUKOCYTESUR NEGATIVE 04/28/2021 1736   Sepsis Labs: @LABRCNTIP (procalcitonin:4,lacticidven:4)  ) Recent Results (from the past 240 hour(s))  Resp Panel by RT-PCR (Flu A&B, Covid) Nasopharyngeal Swab     Status: None   Collection Time: 10/29/21  2:59 PM   Specimen: Nasopharyngeal Swab; Nasopharyngeal(NP) swabs in vial transport medium  Result Value Ref Range Status   SARS Coronavirus 2 by RT PCR NEGATIVE NEGATIVE Final    Comment: (NOTE) SARS-CoV-2 target nucleic acids are NOT DETECTED.  The SARS-CoV-2 RNA is generally detectable in upper respiratory specimens during the acute phase of infection. The lowest concentration of SARS-CoV-2 viral copies this assay can detect is 138 copies/mL. A negative result does not preclude SARS-Cov-2 infection and should not be used as the sole basis for treatment or other patient management decisions. A negative  result may occur with  improper specimen collection/handling, submission of specimen other than nasopharyngeal swab, presence of viral mutation(s) within the areas targeted by this assay, and inadequate number of viral copies(<138 copies/mL). A negative result must be combined with clinical observations, patient history, and epidemiological information. The expected result is Negative.  Fact Sheet for Patients:  EntrepreneurPulse.com.au  Fact Sheet for Healthcare Providers:  IncredibleEmployment.be  This test is no t yet approved or cleared by the [place] FDA and  has been authorized for detection and/or diagnosis of SARS-CoV-2 by FDA under an Emergency Use Authorization (EUA). This EUA will remain  in effect (meaning this test can be used) for the duration of the COVID-19 declaration under Section 564(b)(1) of the Act,  21 U.S.C.section 360bbb-3(b)(1), unless the authorization is terminated  or revoked sooner.       Influenza A by PCR NEGATIVE NEGATIVE Final   Influenza B by PCR NEGATIVE NEGATIVE Final    Comment: (NOTE) The Xpert Xpress SARS-CoV-2/FLU/RSV plus assay is intended as an aid in the diagnosis of influenza from Nasopharyngeal swab specimens and should not be used as a sole basis for treatment. Nasal washings and aspirates are unacceptable for Xpert Xpress SARS-CoV-2/FLU/RSV testing.  Fact Sheet for Patients: EntrepreneurPulse.com.au  Fact Sheet for Healthcare Providers: IncredibleEmployment.be  This test is not yet approved or cleared by the [place] FDA and has been authorized for detection and/or diagnosis of SARS-CoV-2 by FDA under an Emergency Use Authorization (EUA). This EUA will remain in effect (meaning this test can be used) for the duration of the COVID-19 declaration under Section 564(b)(1) of the Act, 21 U.S.C. section 360bbb-3(b)(1), unless the authorization is terminated or revoked.  Performed at [facility], [address] [place], [place] [address]          Radiology Studies: US Paracentesis  Result Date: 11/01/2021 INDICATION: Recurrent ascites request received for paracentesis. EXAM: ULTRASOUND GUIDED  PARACENTESIS MEDICATIONS: Local 1% lidocaine only. COMPLICATIONS: None immediate. PROCEDURE: Informed written consent was obtained from the patient after a discussion of the risks, benefits and alternatives to treatment. A timeout was performed prior to the initiation of the procedure. Initial ultrasound scanning demonstrates a large amount of ascites within the left lower abdominal quadrant. The left lower abdomen was prepped and draped in the usual sterile fashion. 1% lidocaine was used for local anesthesia. Following this, a 19 gauge, 7-cm, Yueh catheter was introduced. An ultrasound image was saved for  documentation purposes. The paracentesis was performed. The catheter was removed and a dressing was applied. The patient tolerated the procedure well without immediate post procedural complication. FINDINGS: A total of approximately 8 L of clear yellow fluid was removed. Samples were sent to the laboratory as requested by the clinical team. IMPRESSION: Successful ultrasound-guided paracentesis yielding 8 liters of peritoneal fluid. Read By: Tsosie Billing PA-C Electronically Signed   By: Aletta Edouard M.D.   On: 11/01/2021 10:48        Scheduled Meds:  (feeding supplement) PROSource Plus  30 mL Oral BID BM   feeding supplement  237 mL Oral BID BM   folic acid  1 mg Oral Daily   furosemide  40 mg Oral Daily   lactulose  20 g Oral TID   multivitamin with minerals  1 tablet Oral Daily   pantoprazole  40 mg Oral Daily   rifaximin  550 mg Oral BID   spironolactone  100 mg Oral Daily   tamsulosin  0.4 mg Oral Daily  thiamine  100 mg Oral Daily   Or   thiamine  100 mg Intravenous Daily   Continuous Infusions:   LOS: 3 days    Time spent: 35 min    Desma Maxim, MD [facility]   If 7PM-7AM, please contact night-coverage [email or web address] Password [facility] 11/01/2021, 11:33 AM

## 2021-11-02 LAB — CBC
HCT: 20.4 % — ABNORMAL LOW (ref 39.0–52.0)
Hemoglobin: 7.3 g/dL — ABNORMAL LOW (ref 13.0–17.0)
MCH: 36 pg — ABNORMAL HIGH (ref 26.0–34.0)
MCHC: 35.8 g/dL (ref 30.0–36.0)
MCV: 100.5 fL — ABNORMAL HIGH (ref 80.0–100.0)
Platelets: 120 10*3/uL — ABNORMAL LOW (ref 150–400)
RBC: 2.03 MIL/uL — ABNORMAL LOW (ref 4.22–5.81)
RDW: 13.8 % (ref 11.5–15.5)
WBC: 5.7 10*3/uL (ref 4.0–10.5)
nRBC: 0 % (ref 0.0–0.2)

## 2021-11-02 LAB — PROTEIN, BODY FLUID (OTHER): Total Protein, Body Fluid Other: 1 g/dL

## 2021-11-02 LAB — COMPREHENSIVE METABOLIC PANEL
ALT: 23 U/L (ref 0–44)
AST: 39 U/L (ref 15–41)
Albumin: 2.6 g/dL — ABNORMAL LOW (ref 3.5–5.0)
Alkaline Phosphatase: 107 U/L (ref 38–126)
Anion gap: 5 (ref 5–15)
BUN: 35 mg/dL — ABNORMAL HIGH (ref 6–20)
CO2: 20 mmol/L — ABNORMAL LOW (ref 22–32)
Calcium: 8 mg/dL — ABNORMAL LOW (ref 8.9–10.3)
Chloride: 106 mmol/L (ref 98–111)
Creatinine, Ser: 1.51 mg/dL — ABNORMAL HIGH (ref 0.61–1.24)
GFR, Estimated: 55 mL/min — ABNORMAL LOW (ref >60)
Glucose, Bld: 157 mg/dL — ABNORMAL HIGH (ref 70–99)
Potassium: 4.5 mmol/L (ref 3.5–5.1)
Sodium: 131 mmol/L — ABNORMAL LOW (ref 135–145)
Total Bilirubin: 2 mg/dL — ABNORMAL HIGH (ref 0.3–1.2)
Total Protein: 5.8 g/dL — ABNORMAL LOW (ref 6.5–8.1)

## 2021-11-02 MED ORDER — ALBUMIN HUMAN 25 % IV SOLN
25.0000 g | Freq: Once | INTRAVENOUS | Status: AC
  Administered 2021-11-02: 25 g via INTRAVENOUS
  Filled 2021-11-02: qty 100

## 2021-11-02 MED ORDER — ENSURE ENLIVE PO LIQD
237.0000 mL | Freq: Three times a day (TID) | ORAL | Status: DC
  Administered 2021-11-02 – 2021-11-03 (×2): 237 mL via ORAL

## 2021-11-02 MED ORDER — LACTULOSE 10 GM/15ML PO SOLN
15.0000 g | Freq: Two times a day (BID) | ORAL | Status: DC
  Administered 2021-11-02 – 2021-11-03 (×2): 15 g via ORAL
  Filled 2021-11-02 (×2): qty 30

## 2021-11-02 MED ORDER — ALBUMIN HUMAN 5 % IV SOLN
25.0000 g | Freq: Once | INTRAVENOUS | Status: AC
Start: 2021-11-02 — End: 2021-11-02
  Administered 2021-11-02: 25 g via INTRAVENOUS
  Filled 2021-11-02: qty 500

## 2021-11-02 NOTE — Progress Notes (Addendum)
Nutrition Follow-up  DOCUMENTATION CODES:   Non-severe (moderate) malnutrition in context of chronic illness  INTERVENTION:   Ensure Enlive po TID, each supplement provides 350 kcal and 20 grams of protein  Pro-Source 5m BID via tube, provides 40kcal and 11g of protein per serving   MVI po daily   Pt at high refeed risk; recommend monitor potassium, magnesium and phosphorus labs daily until stable  Cirrhosis diet education  NUTRITION DIAGNOSIS:   Moderate Malnutrition related to chronic illness (cirrhosis, etoh abuse) as evidenced by moderate fat depletion, severe fat depletion, moderate muscle depletion, severe muscle depletion.  GOAL:   Patient will meet greater than or equal to 90% of their needs -progressing   MONITOR:   PO intake, Supplement acceptance, Labs, Weight trends, Skin, I & O's  ASSESSMENT:   55y.o. male with h/o DM, COVID 120(2020), urinary retention, alcoholic decompensated cirrhosis, ascites and esophageal variceal bleeding s/p ligation in 09/2021 who is now admitted with hepatic encephalopathy and AKI.  -Pt s/p paracentesis with 8.0L output 12/12  Pt is known to this RD from a recent previous admit. Met with pt in room today using digital interpreter. Pt reports decreased appetite and oral intake since June. Pt reports that he has lost > 30lbs over the past 6 months; pt reports that his UBW is ~180lbs. There is very limited weight history in chart to determine if any significant recent weight changes. Pt does not appear to weigh 180lbs and has not been weighed since 12/9. Pt reports that his oral intake is improving in hospital; pt eating 100% of meals. Pt reports that he is drinking two Ensure per day and is taking the ProSource Plus. RD will increase Ensure to three times daily. RD provided pt with cirrhosis diet education today.   Medications reviewed and include: folic acid, lasix, lactulose, protonix, MVI, protonix, aldactone, xifaxan,  thiamine  Labs reviewed: Na 131(L), BUN 35(H), creat 1.51(H) Ammonia 84(H)- 12/10 Folate 24.0, B12 911- 12/10 Hgb 7.3(L), Hct 20.4(L)  NUTRITION - FOCUSED PHYSICAL EXAM:  Flowsheet Row Most Recent Value  Orbital Region Moderate depletion  Upper Arm Region Severe depletion  Thoracic and Lumbar Region Moderate depletion  Buccal Region Moderate depletion  Temple Region Moderate depletion  Clavicle Bone Region Mild depletion  Clavicle and Acromion Bone Region Mild depletion  Scapular Bone Region Moderate depletion  Dorsal Hand Moderate depletion  Patellar Region Severe depletion  Anterior Thigh Region Moderate depletion  Posterior Calf Region Moderate depletion  Edema (RD Assessment) None  Hair Reviewed  Eyes Reviewed  Mouth Reviewed  Skin Reviewed  Nails Reviewed   Diet Order:   Diet Order             Diet 2 gram sodium Room service appropriate? Yes; Fluid consistency: Thin; Fluid restriction: 1500 mL Fluid  Diet effective now                  EDUCATION NEEDS:   Education needs have been addressed  Skin:  Skin Assessment: Reviewed RN Assessment  Last BM:  12/10  Height:   Ht Readings from Last 1 Encounters:  10/29/21 '5\' 5"'  (1.651 m)    Weight:   Wt Readings from Last 1 Encounters:  10/29/21 85 kg    Ideal Body Weight:  61.8 kg  BMI:  Body mass index is 31.18 kg/m.  Estimated Nutritional Needs:   Kcal:  1800-2100kcal/day  Protein:  90-105g/day  Fluid:  1.5L/day  CKoleen DistanceMS, RD, LDN Please refer  to [facility] for RD and/or RD on-call/weekend/after hours pager

## 2021-11-02 NOTE — Plan of Care (Signed)
Nutrition Education Note  RD received consult for pt regarding cirrhosis education.  Pt was given the spanish version of the "Cirrhosis Nutrition Therapy" handout from the Academy of [employer]. Pt was educated today on limiting sodium from the diet. Pt was provided with examples of high sodium foods and provided with names of salt-free alternatives. Pt was also educated on the importance of regular weights and limiting fluid from the diet as advised by MD. Pt was educated on the importance of eating adequate amounts of lean proteins to preserve lean muscle mass and to meet increased estimated needs given pt's current condition. Pt was given examples of healthy proteins and advised on possible supplements.    RD discussed why it is important for patient to adhere to diet recommendations, and emphasized the role of fluids, foods to avoid, and importance of weighing self daily. Teach back method used.  Expect good compliance.  RD following this patient  Betsey Holiday MS, RD, LDN Please refer to [facility] for RD and/or RD on-call/weekend/after hours pager

## 2021-11-02 NOTE — Progress Notes (Signed)
PROGRESS NOTE    Shawn Torres  [id number] DOB: Jan 28, 1966 DOA: 10/29/2021 PCP: Pcp, No  Outpatient Specialists: GI?    Brief Narrative:  From admission h and p Shawn Torres is a 55 y.o. male with a past medical history of decompensated alcoholic cirrhosis with portal hypertension and ascites presenting with encephalopathy in the setting of hyperammonemia. He presented from home after family reported confusion with altered mental status and decreased p.o. intake last few days. They report this happens when his ammonia level gets high. Reportedly he has been taking his lactulose as prescribed. He was initially agitated in the ED and given some ativan IM to obtain labs, etc. No reported hematemesis.  He was admitted last month for hematemesis with esophageal varices.  Underwent EGD with grade 3 varices noted and banding by GI. Upon my evaluation he is still sedated and therefore no meaningful history can be obtained. No family present at bedside upon my evaluation. Nurse states daughter had to go to work and son stepped out to get something to eat. Vital signs are stable.    Assessment & Plan:   Principal Problem:   Hepatic encephalopathy Active Problems:   Cirrhosis, alcoholic ([place])  # Hepatic encephalopathy Appears to be improving. Has not been compliant w/ lactulose per daughter. Pt reports 4 BMs yesterday, 2 today. Daughter says he is back to normal today. - continue lactulose, titrate to 2-3 BMs/day, will reduce dose a little - cont rifaximin  # Alcohol abuse Says last drink was in June. Doesn't appear to be withdrawing.  - stop ciwa - mvi, thiamine, folate  # Cirrhosis # Ascites Large volume paracentesis recent hospitalization no sbp. Here with very distended abdomen on presentation. Meld-na is 24 (~15% 90-day mortality). 8 L of fluid removed via IR paracentesis 12/12. Saag consistent w/ portal hypertension, culture ngtd - GI following - continue lasix 40, spiro 100 - has  appointment with [facility] team on 12/30  # Acute kidney injury Creatinine 1.5 today essentially unchanged from yesterday. Was 1.67 on arrival. Possible hepatorenal. Large volume paracentesis may be contributing. - discussed w/ gi, will give additional dose of albumin today  # Esophageal varices  With recent hospitalization for bleeding varices, s/p banding. No report of bleeding and hgb stable in the 7s - monitor  # T2DM Now diet controlled - daily fasting sugar with daily bmp  # History urinary retention Foley placed at recent hospitalization has since been discontinued. Urinating normally. - monitor for recurrence - cont flomax    DVT prophylaxis: SCDs Code Status: full Family Communication: daughter updated telephonically 12/13  Level of care: Med-Surg Status is: Inpatient  Remains inpatient appropriate because: severity of illness   Consultants:  GI  Procedures: none  Antimicrobials:  none    Subjective: No complaints. Tolerating diet.  Objective: Vitals:   11/01/21 1532 11/01/21 1932 11/02/21 0403 11/02/21 0802  BP: 108/68 103/67 108/62 105/62  Pulse: 70 82 76 71  Resp: 18 20 18    Temp: 97.7 F (36.5 C) 98.3 F (36.8 C) 98.6 F (37 C) 98.3 F (36.8 C)  TempSrc: Oral Oral Oral Oral  SpO2: 100% 100% 100% 99%  Weight:      Height:        Intake/Output Summary (Last 24 hours) at 11/02/2021 1455 Last data filed at 11/02/2021 1044 Gross per 24 hour  Intake 540 ml  Output --  Net 540 ml   Filed Weights   10/29/21 1238  Weight: 85 kg  Examination:  General exam: Appears calm and comfortable. Chronically ill appearing.  Respiratory system: Clear to auscultation. Respiratory effort normal. Cardiovascular system: S1 & S2 heard, RRR. No JVD, murmurs, rubs, gallops or clicks. No pedal edema. Gastrointestinal system: distended, non-tender. Similar to exam yesterday after paracentesis Central nervous system: Aaox3 Extremities:  Symmetric 5 x 5 power. Skin: No rashes, lesions or ulcers Psychiatry: Judgement and insight appear normal. Mood & affect appropriate.     Data Reviewed: I have personally reviewed following labs and imaging studies  CBC: Recent Labs  Lab 10/29/21 1230 10/30/21 0647 10/31/21 0617 11/01/21 0441 11/02/21 0235  WBC 5.3 5.8 5.6 6.2 5.7  HGB 9.4* 8.5* 8.2* 7.6* 7.3*  HCT 27.7* 24.4* 22.9* 21.5* 20.4*  MCV 103.0* 99.6 101.8* 99.1 100.5*  PLT 157 133* 128* 124* 120*   Basic Metabolic Panel: Recent Labs  Lab 10/30/21 0647 10/31/21 0617 10/31/21 1924 11/01/21 0441 11/02/21 0235  NA 136 133* 132* 130* 131*  K 4.0 3.8 4.3 4.2 4.5  CL 111 108 106 105 106  CO2 19* 18* 20* 19* 20*  GLUCOSE 84 106* 149* 111* 157*  BUN 32* 30* 31* 32* 35*  CREATININE 1.30* 1.17 1.40* 1.49* 1.51*  CALCIUM 8.2* 8.0* 7.8* 7.8* 8.0*   GFR: Estimated Creatinine Clearance: 56.7 mL/min (A) (by C-G formula based on SCr of 1.51 mg/dL (H)). Liver Function Tests: Recent Labs  Lab 10/29/21 1230 10/30/21 0647 10/31/21 0617 11/01/21 0441 11/02/21 0235  AST 50* 43* 38 40 39  ALT 27 24 23 23 23   ALKPHOS 113 101 94 107 107  BILITOT 3.5* 4.6* 3.7* 2.4* 2.0*  PROT 6.7 5.6* 5.2* 5.5* 5.8*  ALBUMIN 2.6* 2.2* 2.1* 2.0* 2.6*   No results for input(s): LIPASE, AMYLASE in the last 168 hours. Recent Labs  Lab 10/29/21 1230 10/30/21 0647  AMMONIA 231* 84*   Coagulation Profile: Recent Labs  Lab 10/29/21 1230 10/30/21 0647  INR 1.8* 2.1*   Cardiac Enzymes: No results for input(s): CKTOTAL, CKMB, CKMBINDEX, TROPONINI in the last 168 hours. BNP (last 3 results) No results for input(s): PROBNP in the last 8760 hours. HbA1C: No results for input(s): HGBA1C in the last 72 hours. CBG: No results for input(s): GLUCAP in the last 168 hours. Lipid Profile: No results for input(s): CHOL, HDL, LDLCALC, TRIG, CHOLHDL, LDLDIRECT in the last 72 hours. Thyroid Function Tests: No results for input(s): TSH,  T4TOTAL, FREET4, T3FREE, THYROIDAB in the last 72 hours. Anemia Panel: No results for input(s): VITAMINB12, FOLATE, FERRITIN, TIBC, IRON, RETICCTPCT in the last 72 hours.  Urine analysis:    Component Value Date/Time   COLORURINE AMBER (A) 04/28/2021 1736   APPEARANCEUR CLEAR (A) 04/28/2021 1736   LABSPEC 1.012 04/28/2021 1736   PHURINE 6.0 04/28/2021 1736   GLUCOSEU NEGATIVE 04/28/2021 1736   HGBUR MODERATE (A) 04/28/2021 1736   BILIRUBINUR NEGATIVE 04/28/2021 1736   KETONESUR NEGATIVE 04/28/2021 1736   PROTEINUR 30 (A) 04/28/2021 1736   NITRITE NEGATIVE 04/28/2021 1736   LEUKOCYTESUR NEGATIVE 04/28/2021 1736   Sepsis Labs: @LABRCNTIP (procalcitonin:4,lacticidven:4)  ) Recent Results (from the past 240 hour(s))  Resp Panel by RT-PCR (Flu A&B, Covid) Nasopharyngeal Swab     Status: None   Collection Time: 10/29/21  2:59 PM   Specimen: Nasopharyngeal Swab; Nasopharyngeal(NP) swabs in vial transport medium  Result Value Ref Range Status   SARS Coronavirus 2 by RT PCR NEGATIVE NEGATIVE Final    Comment: (NOTE) SARS-CoV-2 target nucleic acids are NOT DETECTED.  The SARS-CoV-2 RNA  is generally detectable in upper respiratory specimens during the acute phase of infection. The lowest concentration of SARS-CoV-2 viral copies this assay can detect is 138 copies/mL. A negative result does not preclude SARS-Cov-2 infection and should not be used as the sole basis for treatment or other patient management decisions. A negative result may occur with  improper specimen collection/handling, submission of specimen other than nasopharyngeal swab, presence of viral mutation(s) within the areas targeted by this assay, and inadequate number of viral copies(<138 copies/mL). A negative result must be combined with clinical observations, patient history, and epidemiological information. The expected result is Negative.  Fact Sheet for Patients:  EntrepreneurPulse.com.au  Fact  Sheet for Healthcare Providers:  IncredibleEmployment.be  This test is no t yet approved or cleared by the [place] FDA and  has been authorized for detection and/or diagnosis of SARS-CoV-2 by FDA under an Emergency Use Authorization (EUA). This EUA will remain  in effect (meaning this test can be used) for the duration of the COVID-19 declaration under Section 564(b)(1) of the Act, 21 U.S.C.section 360bbb-3(b)(1), unless the authorization is terminated  or revoked sooner.       Influenza A by PCR NEGATIVE NEGATIVE Final   Influenza B by PCR NEGATIVE NEGATIVE Final    Comment: (NOTE) The Xpert Xpress SARS-CoV-2/FLU/RSV plus assay is intended as an aid in the diagnosis of influenza from Nasopharyngeal swab specimens and should not be used as a sole basis for treatment. Nasal washings and aspirates are unacceptable for Xpert Xpress SARS-CoV-2/FLU/RSV testing.  Fact Sheet for Patients: EntrepreneurPulse.com.au  Fact Sheet for Healthcare Providers: IncredibleEmployment.be  This test is not yet approved or cleared by the [place] FDA and has been authorized for detection and/or diagnosis of SARS-CoV-2 by FDA under an Emergency Use Authorization (EUA). This EUA will remain in effect (meaning this test can be used) for the duration of the COVID-19 declaration under Section 564(b)(1) of the Act, 21 U.S.C. section 360bbb-3(b)(1), unless the authorization is terminated or revoked.  Performed at [facility] [address] [place], [place] [address]   Body fluid culture w Gram Stain     Status: None (Preliminary result)   Collection Time: 11/01/21  9:20 AM   Specimen: Peritoneal Washings  Result Value Ref Range Status   Specimen Description   Final    PERITONEAL Performed at [facility], [place], [address], [place], [place] [address]    Special Requests   Final    NONE Performed at [facility], [address] [place], [place] [address]    Gram Stain   Final    WBC PRESENT, PREDOMINANTLY MONONUCLEAR NO ORGANISMS SEEN CYTOSPIN SMEAR    Culture   Final    NO GROWTH < 24 HOURS Performed at [facility], [place] [address] [place], [place] [address]    Report Status PENDING  Incomplete         Radiology Studies: US Paracentesis  Result Date: 11/01/2021 INDICATION: Recurrent ascites request received for paracentesis. EXAM: ULTRASOUND GUIDED  PARACENTESIS MEDICATIONS: Local 1% lidocaine only. COMPLICATIONS: None immediate. PROCEDURE: Informed written consent was obtained from the patient after a discussion of the risks, benefits and alternatives to treatment. A timeout was performed prior to the initiation of the procedure. Initial ultrasound scanning demonstrates a large amount of ascites within the left lower abdominal quadrant. The left lower abdomen was prepped and draped in the usual sterile fashion. 1% lidocaine was used for local anesthesia. Following this, a  19 gauge, 7-cm, Yueh catheter was introduced. An ultrasound image was saved for documentation purposes. The paracentesis was performed. The catheter was removed and a dressing was applied. The patient tolerated the procedure well without immediate post procedural complication. FINDINGS: A total of approximately 8 L of clear yellow fluid was removed. Samples were sent to the laboratory as requested by the clinical team. IMPRESSION: Successful ultrasound-guided paracentesis yielding 8 liters of peritoneal fluid. Read By: Tsosie Billing PA-C Electronically Signed   By: Aletta Edouard M.D.   On: 11/01/2021 10:48        Scheduled Meds:  (feeding supplement) PROSource Plus  30 mL Oral BID BM   feeding supplement  237 mL Oral BID BM   folic acid  1 mg Oral Daily   furosemide  40 mg Oral Daily   lactulose  20 g Oral BID   multivitamin with minerals  1 tablet Oral Daily   pantoprazole  40 mg Oral Daily    rifaximin  550 mg Oral BID   spironolactone  100 mg Oral Daily   tamsulosin  0.4 mg Oral Daily   thiamine  100 mg Oral Daily   Or   thiamine  100 mg Intravenous Daily   Continuous Infusions:  albumin human       LOS: 4 days    Time spent: 25 min    Desma Maxim, MD [facility]   If 7PM-7AM, please contact night-coverage [email or web address] Password [email or web address] 11/02/2021, 2:55 PM

## 2021-11-03 ENCOUNTER — Telehealth: Payer: Self-pay

## 2021-11-03 ENCOUNTER — Other Ambulatory Visit: Payer: Self-pay

## 2021-11-03 DIAGNOSIS — N182 Chronic kidney disease, stage 2 (mild): Secondary | ICD-10-CM | POA: Clinically undetermined

## 2021-11-03 DIAGNOSIS — N179 Acute kidney failure, unspecified: Secondary | ICD-10-CM | POA: Clinically undetermined

## 2021-11-03 DIAGNOSIS — I8510 Secondary esophageal varices without bleeding: Secondary | ICD-10-CM

## 2021-11-03 DIAGNOSIS — D539 Nutritional anemia, unspecified: Secondary | ICD-10-CM | POA: Diagnosis present

## 2021-11-03 DIAGNOSIS — N1831 Chronic kidney disease, stage 3a: Secondary | ICD-10-CM | POA: Clinically undetermined

## 2021-11-03 DIAGNOSIS — D61818 Other pancytopenia: Secondary | ICD-10-CM | POA: Diagnosis present

## 2021-11-03 LAB — COMPREHENSIVE METABOLIC PANEL
ALT: 21 U/L (ref 0–44)
AST: 35 U/L (ref 15–41)
Albumin: 2.9 g/dL — ABNORMAL LOW (ref 3.5–5.0)
Alkaline Phosphatase: 108 U/L (ref 38–126)
Anion gap: 6 (ref 5–15)
BUN: 39 mg/dL — ABNORMAL HIGH (ref 6–20)
CO2: 21 mmol/L — ABNORMAL LOW (ref 22–32)
Calcium: 8.1 mg/dL — ABNORMAL LOW (ref 8.9–10.3)
Chloride: 106 mmol/L (ref 98–111)
Creatinine, Ser: 1.24 mg/dL (ref 0.61–1.24)
GFR, Estimated: >60 mL/min (ref >60)
Glucose, Bld: 141 mg/dL — ABNORMAL HIGH (ref 70–99)
Potassium: 4.3 mmol/L (ref 3.5–5.1)
Sodium: 133 mmol/L — ABNORMAL LOW (ref 135–145)
Total Bilirubin: 2.1 mg/dL — ABNORMAL HIGH (ref 0.3–1.2)
Total Protein: 5.5 g/dL — ABNORMAL LOW (ref 6.5–8.1)

## 2021-11-03 LAB — CBC
HCT: 20.2 % — ABNORMAL LOW (ref 39.0–52.0)
Hemoglobin: 7.4 g/dL — ABNORMAL LOW (ref 13.0–17.0)
MCH: 37.4 pg — ABNORMAL HIGH (ref 26.0–34.0)
MCHC: 36.6 g/dL — ABNORMAL HIGH (ref 30.0–36.0)
MCV: 102 fL — ABNORMAL HIGH (ref 80.0–100.0)
Platelets: 123 10*3/uL — ABNORMAL LOW (ref 150–400)
RBC: 1.98 MIL/uL — ABNORMAL LOW (ref 4.22–5.81)
RDW: 13.7 % (ref 11.5–15.5)
WBC: 5.6 10*3/uL (ref 4.0–10.5)
nRBC: 0 % (ref 0.0–0.2)

## 2021-11-03 MED ORDER — SPIRONOLACTONE 100 MG PO TABS: 100.0000 mg | ORAL_TABLET | Freq: Every day | ORAL | 2 refills | Status: DC

## 2021-11-03 MED ORDER — FUROSEMIDE 40 MG PO TABS: 40.0000 mg | ORAL_TABLET | Freq: Every day | ORAL | 2 refills | Status: DC

## 2021-11-03 MED ORDER — PROSOURCE PLUS PO LIQD: 30.0000 mL | Freq: Two times a day (BID) | ORAL | 1 refills | Status: DC

## 2021-11-03 MED ORDER — RIFAXIMIN 550 MG PO TABS: 550.0000 mg | ORAL_TABLET | Freq: Two times a day (BID) | ORAL | 2 refills | Status: DC

## 2021-11-03 MED ORDER — LACTULOSE 10 GM/15ML PO SOLN: 15.0000 g | Freq: Two times a day (BID) | ORAL | 0 refills | Status: DC

## 2021-11-03 MED ORDER — ENSURE ENLIVE PO LIQD: 237.0000 mL | Freq: Three times a day (TID) | ORAL | 12 refills | Status: DC

## 2021-11-03 NOTE — Progress Notes (Signed)
Shawn Darby, MD [address]  [place]  [place], [place] [address]  Main: [phone]  Fax: [phone] Pager: [phone]   Subjective: No acute events overnight.  Patient is ready to go home today.  He received albumin infusion.  Renal function improved.  Objective: Vital signs in last 24 hours: Vitals:   11/02/21 1531 11/02/21 1938 11/03/21 0402 11/03/21 0727  BP: 104/74 97/60 111/66 108/73  Pulse: 90 87 73 68  Resp:  18 18 19   Temp: 97.9 F (36.6 C) 98.6 F (37 C) 98.9 F (37.2 C) 98.5 F (36.9 C)  TempSrc: Oral  Oral Oral  SpO2: 100% 100% 99% 100%  Weight:      Height:       Weight change:  No intake or output data in the 24 hours ending 11/03/21 1614    Exam: Heart:: Regular rate and rhythm, S1S2 present, or without murmur or extra heart sounds Lungs: normal and clear to auscultation Abdomen: Mildly distended, soft, nontender, nontender umbilical hernia   Lab Results: CBC Latest Ref Rng & Units 11/03/2021 11/02/2021 11/01/2021  WBC 4.0 - 10.5 K/uL 5.6 5.7 6.2  Hemoglobin 13.0 - 17.0 g/dL 7.4(L) 7.3(L) 7.6(L)  Hematocrit 39.0 - 52.0 % 20.2(L) 20.4(L) 21.5(L)  Platelets 150 - 400 K/uL 123(L) 120(L) 124(L)   CMP Latest Ref Rng & Units 11/03/2021 11/02/2021 11/01/2021  Glucose 70 - 99 mg/dL 141(H) 157(H) 111(H)  BUN 6 - 20 mg/dL 39(H) 35(H) 32(H)  Creatinine 0.61 - 1.24 mg/dL 1.24 1.51(H) 1.49(H)  Sodium 135 - 145 mmol/L 133(L) 131(L) 130(L)  Potassium 3.5 - 5.1 mmol/L 4.3 4.5 4.2  Chloride 98 - 111 mmol/L 106 106 105  CO2 22 - 32 mmol/L 21(L) 20(L) 19(L)  Calcium 8.9 - 10.3 mg/dL 8.1(L) 8.0(L) 7.8(L)  Total Protein 6.5 - 8.1 g/dL 5.5(L) 5.8(L) 5.5(L)  Total Bilirubin 0.3 - 1.2 mg/dL 2.1(H) 2.0(H) 2.4(H)  Alkaline Phos 38 - 126 U/L 108 107 107  AST 15 - 41 U/L 35 39 40  ALT 0 - 44 U/L 21 23 23     Micro Results: Recent Results (from the past 240 hour(s))  Resp Panel by RT-PCR (Flu A&B, Covid) Nasopharyngeal Swab     Status: None    Collection Time: 10/29/21  2:59 PM   Specimen: Nasopharyngeal Swab; Nasopharyngeal(NP) swabs in vial transport medium  Result Value Ref Range Status   SARS Coronavirus 2 by RT PCR NEGATIVE NEGATIVE Final    Comment: (NOTE) SARS-CoV-2 target nucleic acids are NOT DETECTED.  The SARS-CoV-2 RNA is generally detectable in upper respiratory specimens during the acute phase of infection. The lowest concentration of SARS-CoV-2 viral copies this assay can detect is 138 copies/mL. A negative result does not preclude SARS-Cov-2 infection and should not be used as the sole basis for treatment or other patient management decisions. A negative result may occur with  improper specimen collection/handling, submission of specimen other than nasopharyngeal swab, presence of viral mutation(s) within the areas targeted by this assay, and inadequate number of viral copies(<138 copies/mL). A negative result must be combined with clinical observations, patient history, and epidemiological information. The expected result is Negative.  Fact Sheet for Patients:  EntrepreneurPulse.com.au  Fact Sheet for Healthcare Providers:  IncredibleEmployment.be  This test is no t yet approved or cleared by the [place] FDA and  has been authorized for detection and/or diagnosis of SARS-CoV-2 by FDA under an Emergency Use Authorization (EUA). This EUA will remain  in effect (meaning  this test can be used) for the duration of the COVID-19 declaration under Section 564(b)(1) of the Act, 21 U.S.C.section 360bbb-3(b)(1), unless the authorization is terminated  or revoked sooner.       Influenza A by PCR NEGATIVE NEGATIVE Final   Influenza B by PCR NEGATIVE NEGATIVE Final    Comment: (NOTE) The Xpert Xpress SARS-CoV-2/FLU/RSV plus assay is intended as an aid in the diagnosis of influenza from Nasopharyngeal swab specimens and should not be used as a sole basis for treatment.  Nasal washings and aspirates are unacceptable for Xpert Xpress SARS-CoV-2/FLU/RSV testing.  Fact Sheet for Patients: EntrepreneurPulse.com.au  Fact Sheet for Healthcare Providers: IncredibleEmployment.be  This test is not yet approved or cleared by the [place] FDA and has been authorized for detection and/or diagnosis of SARS-CoV-2 by FDA under an Emergency Use Authorization (EUA). This EUA will remain in effect (meaning this test can be used) for the duration of the COVID-19 declaration under Section 564(b)(1) of the Act, 21 U.S.C. section 360bbb-3(b)(1), unless the authorization is terminated or revoked.  Performed at [facility], [address] [place], [place] [address]   Body fluid culture w Gram Stain     Status: None (Preliminary result)   Collection Time: 11/01/21  9:20 AM   Specimen: Peritoneal Washings  Result Value Ref Range Status   Specimen Description   Final    PERITONEAL Performed at [facility], [address], [place], [place] [address]    Special Requests   Final    NONE Performed at [facility], [address] [place], [place] [address]    Gram Stain   Final    WBC PRESENT, PREDOMINANTLY MONONUCLEAR NO ORGANISMS SEEN CYTOSPIN SMEAR    Culture   Final    NO GROWTH 2 DAYS Performed at [facility], [place] [address], [place],  [address]    Report Status PENDING  Incomplete   Studies/Results: No results found. Medications: I have reviewed the patient's current medications. Prior to Admission:  No medications prior to admission.   Scheduled:  (feeding supplement) PROSource Plus  30 mL Oral BID BM   feeding supplement  237 mL Oral TID BM   folic acid  1 mg Oral Daily   furosemide  40 mg Oral Daily   lactulose  15 g Oral BID   multivitamin with minerals  1 tablet Oral Daily   pantoprazole  40 mg Oral Daily   rifaximin  550 mg Oral BID   spironolactone  100 mg  Oral Daily   tamsulosin  0.4 mg Oral Daily   thiamine  100 mg Oral Daily   Or   thiamine  100 mg Intravenous Daily   Continuous:   [id number] **OR** ondansetron (ZOFRAN) IV Anti-infectives (From admission, onward)    Start     Dose/Rate Route Frequency Ordered Stop   11/03/21 0000  rifaximin (XIFAXAN) 550 MG TABS tablet        550 mg Oral 2 times daily 11/03/21 1056     10/30/21 1030  rifaximin (XIFAXAN) tablet 550 mg        550 mg Oral 2 times daily 10/30/21 0935        Scheduled Meds:  (feeding supplement) PROSource Plus  30 mL Oral BID BM   feeding supplement  237 mL Oral TID BM   folic acid  1 mg Oral Daily   furosemide  40 mg Oral Daily   lactulose  15 g Oral BID  multivitamin with minerals  1 tablet Oral Daily   pantoprazole  40 mg Oral Daily   rifaximin  550 mg Oral BID   spironolactone  100 mg Oral Daily   tamsulosin  0.4 mg Oral Daily   thiamine  100 mg Oral Daily   Or   thiamine  100 mg Intravenous Daily   Continuous Infusions:   PRN Meds:.ondansetron **OR** ondansetron (ZOFRAN) IV   Assessment: Principal Problem:   Hepatic encephalopathy Active Problems:   Secondary esophageal varices without bleeding (HCC)   Alcohol use disorder, severe, in early remission (HCC)   Alcoholic cirrhosis of liver with ascites (HCC)   Thrombocytopenia (HCC)   Type 2 diabetes mellitus, without long-term current use of insulin (HCC)   AKI (acute kidney injury) (HCC)   Macrocytic anemia  Shawn Torres is a 55 y.o. Hispanic male with decompensated alcoholic cirrhosis manifested as ascites, hepatic encephalopathy and esophageal variceal bleeding s/p ligation x4 in 09/2021 is admitted with worsening hepatic encephalopathy as well as worsening abdominal distention  Plan:  Hepatic encephalopathy: Currently resolved Continue lactulose 20 to 30 g 1-2 times daily, adjust the dose to have 2-3 soft bowel movements only to prevent AKI from diarrhea Continue Xifaxan 550 mg twice  daily long-term, patient does not have insurance, his daughter picked up samples from my office He has to apply for Medicaid No known history of portal vein thrombosis based on ultrasound Dopplers in 6/22 No evidence of SBP  Recurrence of ascites Ascitic fluid analysis in the past consistent with portal hypertension, no evidence of SBP on repeat fluid analysis S/p therapeutic paracentesis, patient is requiring large-volume paracentesis frequently, unfortunately he is not a candidate for TIPS due to hepatic encephalopathy S/p albumin infusion post paracentesis Continue Lasix 40 mg daily and spironolactone 100 mg daily Monitor electrolytes and renal function closely to adjust the dose Strict low-sodium diet, patient is seen by dietitian  HRS Rising creatinine nicely responded to IV albumin   Decompensated cirrhosis of liver MELD - Na 25, Childs C Portal hypertension manifested as thrombocytopenia, ascites, encephalopathy and varices Macrocytic anemia -secondary to chronic liver disease, no evidence of iron deficiency, B12 and folate levels normal, no evidence of active GI bleed at this time PSE: Manage as above HRS: None HCC: No evidence of liver lesions Patient has appointment to see [facility] hepatology on 11/19/2021   Thank you for involving me in the care of this patient.  Follow-up in GI office in 1 month, locally   LOS: 5 days   Shawn Torres 11/03/2021, 4:14 PM

## 2021-11-03 NOTE — Telephone Encounter (Signed)
-----   Message from Toney Reil, MD sent at 11/03/2021  4:13 PM EST ----- Regarding: Follow-up Hope  Please call patient's daughter and inform her that she should keep appointment at [facility] transplant hepatologist on 12/30 even though she has appointment to see me locally in January  Thank you  RV

## 2021-11-03 NOTE — Discharge Summary (Signed)
Physician Discharge Summary   Patient name: Shawn Torres  Admit date:     10/29/2021  Discharge date: 11/03/2021  Discharge Physician: Pennie Banter   PCP: Pcp, No   Recommendations at discharge:   --Follow up with Gastroenterology / Hepatology at [facility]  --Follow up with PCP within 1 week --Titrate lactulose dosing for control of ammonia levels   Discharge Diagnoses Principal Problem:   Hepatic encephalopathy Active Problems:   Secondary esophageal varices without bleeding (HCC)   Alcoholic cirrhosis of liver with ascites (HCC)   AKI (acute kidney injury) (HCC)   Macrocytic anemia   Type 2 diabetes mellitus, without long-term current use of insulin (HCC)   Alcohol use disorder, severe, in early remission (HCC)   Thrombocytopenia [facility])    Hospital Course   From admission h and p "Shawn Torres is a 55 y.o. male with a past medical history of decompensated alcoholic cirrhosis with portal hypertension and ascites presenting with encephalopathy in the setting of hyperammonemia. He presented from home after family reported confusion with altered mental status and decreased p.o. intake last few days. They report this happens when his ammonia level gets high. Reportedly he has been taking his lactulose as prescribed. He was initially agitated in the ED and given some ativan IM to obtain labs, etc. No reported hematemesis.  He was admitted last month for hematemesis with esophageal varices.  Underwent EGD with grade 3 varices noted and banding by GI. Upon my evaluation he is still sedated and therefore no meaningful history can be obtained. No family present at bedside upon my evaluation. Nurse states daughter had to go to work and son stepped out to get something to eat. Vital signs are stable. "   # Hepatic encephalopathy Appears to be improving. Has not been compliant w/ lactulose per daughter. Pt reports 4 BMs yesterday, 2 today. Daughter says he is back to normal today. - continue  lactulose, titrate to 2-3 BMs/day - cont rifaximin  Patient's mental status improved, at baseline. Clinically improved and stable for discharge with scheduled GI follow up.  Pt was counseled extensively with use of virtual interpreter regarding importance of taking his medications as prescribed, and multiple loose BM's daily to prevent recurrent encephalopathy.     # Alcohol abuse Says last drink was in June. Doesn't appear to be withdrawing.   Monitored on CIWA without significant withdrawal symptoms.  Stable. - mvi, thiamine, folate   # Cirrhosis # Ascites Large volume paracentesis recent hospitalization no sbp. Here with very distended abdomen on presentation. Meld-na is 24 (~15% 90-day mortality). 8 L of fluid removed via IR paracentesis 12/12. Saag consistent w/ portal hypertension, culture ngtd - GI consulted - continue lasix 40, spiro 100 - per GI, has appt with [facility] liver transplant team on 12/30   # Acute kidney injury Creatinine 1.5 today essentially unchanged from yesterday. Was 1.67 on arrival. Possible hepatorenal. Large volume paracentesis may be contributing.   Check BMP in follow up.   # Esophageal varices  With recent hospitalization for bleeding varices, s/p banding. No report of bleeding and hgb stable in the 7smonitor   # T2DM Now diet controlled PCP follow up   # History urinary retention Foley placed at recent hospitalization has since been discontinued. Urinating normally. No issues at time of discharge. - cont flomax    Procedures performed: none   Condition at discharge: stable  Exam Physical Exam  General exam: awake, alert, no acute distress, chronically ill-appearing Respiratory system:  CTAB, no wheezes, rales or rhonchi, normal respiratory effort. Cardiovascular system: normal S1/S2, RRR   Gastrointestinal system: soft, NT, ND Central nervous system: A&O x3. no gross focal neurologic deficits, normal speech Skin: dry, intact, normal  temperature Psychiatry: normal mood, congruent affect, judgement and insight appear normal ,   Disposition: Home  Discharge time: greater than 30 minutes.  Follow-up Information     Caryl Never, MD. Call.   Specialty: Internal Medicine Why: Call to confirm next appointment on 11/19/2021. Contact information: [address] [place] [address] [phone]         Kathreen Cosier, MD. Call.   Specialty: Internal Medicine Why: Call to confirm next appointment and consideration for possible liver transplant. Contact information: [address] [place] [address] [phone]                 Allergies as of 11/03/2021   No Known Allergies      Medication List     TAKE these medications    (feeding supplement) PROSource Plus liquid Take 30 mLs by mouth 2 (two) times daily between meals.   feeding supplement Liqd Take 237 mLs by mouth 3 (three) times daily between meals.   folic acid 1 MG tablet Commonly known as: FOLVITE Take 1 tablet by mouth daily.   furosemide 40 MG tablet Commonly known as: LASIX Take 1 tablet (40 mg total) by mouth daily. Start taking on: November 04, 2021 What changed:  medication strength how much to take   lactulose 10 GM/15ML solution Commonly known as: CHRONULAC Take 22.5 mLs (15 g total) by mouth 2 (two) times daily. What changed:  how much to take when to take this   multivitamin with minerals Tabs tablet Take 1 tablet by mouth daily.   ondansetron 4 MG disintegrating tablet Commonly known as: ZOFRAN-ODT Take 1 tablet (4 mg total) by mouth every 8 (eight) hours as needed for nausea or vomiting.   pantoprazole 40 MG tablet Commonly known as: PROTONIX Take 1 tablet (40 mg total) by mouth daily.   rifaximin 550 MG Tabs tablet Commonly known as: XIFAXAN Take 1 tablet (550 mg total) by mouth 2 (two) times daily.   spironolactone 100 MG tablet Commonly  known as: ALDACTONE Take 1 tablet (100 mg total) by mouth daily. Start taking on: November 04, 2021 What changed:  medication strength how much to take   tamsulosin 0.4 MG Caps capsule Commonly known as: FLOMAX Take 1 capsule (0.4 mg total) by mouth daily.   thiamine 100 MG tablet Take 1 tablet (100 mg total) by mouth daily.        CT HEAD WO CONTRAST (5MM)  Result Date: 10/10/2021 CLINICAL DATA:  Cerebral edema Hyperammonemia. EXAM: CT HEAD WITHOUT CONTRAST TECHNIQUE: Contiguous axial images were obtained from the base of the skull through the vertex without intravenous contrast. COMPARISON:  None. FINDINGS: Brain: There is no mass, hemorrhage or extra-axial collection. The size and configuration of the ventricles and extra-axial CSF spaces are normal. The brain parenchyma is normal, without acute or chronic infarction. Vascular: No abnormal hyperdensity of the major intracranial arteries or dural venous sinuses. No intracranial atherosclerosis. Skull: The visualized skull base, calvarium and extracranial soft tissues are normal. Sinuses/Orbits: No fluid levels or advanced mucosal thickening of the visualized paranasal sinuses. No mastoid or middle ear effusion. The orbits are normal. IMPRESSION: Normal head CT. Electronically Signed   By: Cletus Gash.D.  On: 10/10/2021 18:54   US Paracentesis  Result Date: 11/01/2021 INDICATION: Recurrent ascites request received for paracentesis. EXAM: ULTRASOUND GUIDED  PARACENTESIS MEDICATIONS: Local 1% lidocaine only. COMPLICATIONS: None immediate. PROCEDURE: Informed written consent was obtained from the patient after a discussion of the risks, benefits and alternatives to treatment. A timeout was performed prior to the initiation of the procedure. Initial ultrasound scanning demonstrates a large amount of ascites within the left lower abdominal quadrant. The left lower abdomen was prepped and draped in the usual sterile fashion. 1% lidocaine  was used for local anesthesia. Following this, a 19 gauge, 7-cm, Yueh catheter was introduced. An ultrasound image was saved for documentation purposes. The paracentesis was performed. The catheter was removed and a dressing was applied. The patient tolerated the procedure well without immediate post procedural complication. FINDINGS: A total of approximately 8 L of clear yellow fluid was removed. Samples were sent to the laboratory as requested by the clinical team. IMPRESSION: Successful ultrasound-guided paracentesis yielding 8 liters of peritoneal fluid. Read By: Pattricia BossKoreen Morgan PA-C Electronically Signed   By: Irish LackGlenn  Yamagata M.D.   On: 11/01/2021 10:48   US Paracentesis  Result Date: 10/13/2021 INDICATION: Ascites request received for diagnostic and therapeutic paracentesis, patient on vasopressor support. EXAM: ULTRASOUND GUIDED  PARACENTESIS MEDICATIONS: Local 1% lidocaine only. COMPLICATIONS: None immediate. PROCEDURE: Informed written consent was obtained from the patient after a discussion of the risks, benefits and alternatives to treatment. A timeout was performed prior to the initiation of the procedure. Initial ultrasound scanning demonstrates a large amount of ascites within the right lower abdominal quadrant. The right lower abdomen was prepped and draped in the usual sterile fashion. 1% lidocaine was used for local anesthesia. Following this, a 19 gauge, 7-cm, Yueh catheter was introduced. An ultrasound image was saved for documentation purposes. The paracentesis was performed. The catheter was removed and a dressing was applied. The patient tolerated the procedure well without immediate post procedural complication. A total of 5 L was removed there is additional ascites that remains however patient is requiring vasopressor support at this time so decision was made to only remove 5 Liters. FINDINGS: A total of approximately 5 L of clear yellow fluid was removed. Samples were sent to the  laboratory as requested by the clinical team. IMPRESSION: Successful ultrasound-guided paracentesis yielding 5 liters of peritoneal fluid. Read By: Pattricia BossKoreen Morgan PA-C Electronically Signed   By: Olive BassYasser  El-Abd M.D.   On: 10/13/2021 13:57   DG Chest Port 1 View  Result Date: 10/29/2021 CLINICAL DATA:  Altered mental status EXAM: PORTABLE CHEST 1 VIEW COMPARISON:  10/13/2021 FINDINGS: Cardiomegaly. Unchanged elevation of the right hemidiaphragm. No acute airspace opacity. The visualized skeletal structures are unremarkable. IMPRESSION: 1.  Cardiomegaly. 2. No acute abnormality of the lungs. Unchanged elevation of the right hemidiaphragm. Electronically Signed   By: Jearld LeschAlex D Bibbey M.D.   On: 10/29/2021 13:03   DG Chest Port 1 View  Result Date: 10/13/2021 CLINICAL DATA:  Acute respiratory failure with hypoxia (HCC) J96.01 (ICD-10-CM) EXAM: PORTABLE CHEST 1 VIEW COMPARISON:  October 10, 2021. FINDINGS: Endotracheal tube tip at the level of the clavicular heads, unchanged. Left IJ central venous catheter with the tip projecting at the superior cavoatrial junction, unchanged. Low lung volumes with similar streaky bilateral lung opacities. No visible pleural effusions or pneumothorax on this semi erect radiograph. IMPRESSION: 1. Stable support devices. 2. Low lung volumes with similar streaky bilateral opacities, most likely atelectasis. Electronically Signed   By: Feliberto HartsFrederick S Jones  M.D.   On: 10/13/2021 07:58   DG Chest Port 1 View  Result Date: 10/10/2021 CLINICAL DATA:  Check central line placement EXAM: PORTABLE CHEST 1 VIEW COMPARISON:  Film from earlier in the same day. FINDINGS: New left jugular central line is noted with catheter tip at the cavoatrial junction. No pneumothorax is noted. Cardiac shadow is stable. Endotracheal tube is. Lungs demonstrate some patchy atelectatic changes bilaterally. No bony abnormality is noted. IMPRESSION: No pneumothorax following central line placement. Electronically  Signed   By: Inez Catalina M.D.   On: 10/10/2021 23:56   DG Chest Port 1 View  Result Date: 10/10/2021 CLINICAL DATA:  Intubation EXAM: PORTABLE CHEST 1 VIEW COMPARISON:  Chest radiograph 04/12/2021 FINDINGS: The endotracheal tube tip is approximately 3.2 cm from the carina. The cardiomediastinal silhouette is stable. Lung volumes are low, with unchanged slight asymmetric elevation of the right hemidiaphragm. Patchy opacities in the left base may reflect infection or aspiration. There is no other focal consolidation. Linear opacities in the right midlung may reflect atelectasis or scar. There is no pulmonary edema. There is no pleural effusion or pneumothorax. There is no acute osseous abnormality. IMPRESSION: 1. Endotracheal tube in satisfactory position. 2. Patchy opacities in the left base could reflect aspiration or infection. Electronically Signed   By: Valetta Mole M.D.   On: 10/10/2021 18:54   Results for orders placed or performed during the hospital encounter of 10/29/21  Resp Panel by RT-PCR (Flu A&B, Covid) Nasopharyngeal Swab     Status: None   Collection Time: 10/29/21  2:59 PM   Specimen: Nasopharyngeal Swab; Nasopharyngeal(NP) swabs in vial transport medium  Result Value Ref Range Status   SARS Coronavirus 2 by RT PCR NEGATIVE NEGATIVE Final    Comment: (NOTE) SARS-CoV-2 target nucleic acids are NOT DETECTED.  The SARS-CoV-2 RNA is generally detectable in upper respiratory specimens during the acute phase of infection. The lowest concentration of SARS-CoV-2 viral copies this assay can detect is 138 copies/mL. A negative result does not preclude SARS-Cov-2 infection and should not be used as the sole basis for treatment or other patient management decisions. A negative result may occur with  improper specimen collection/handling, submission of specimen other than nasopharyngeal swab, presence of viral mutation(s) within the areas targeted by this assay, and inadequate number of  viral copies(<138 copies/mL). A negative result must be combined with clinical observations, patient history, and epidemiological information. The expected result is Negative.  Fact Sheet for Patients:  EntrepreneurPulse.com.au  Fact Sheet for Healthcare Providers:  IncredibleEmployment.be  This test is no t yet approved or cleared by the [place] FDA and  has been authorized for detection and/or diagnosis of SARS-CoV-2 by FDA under an Emergency Use Authorization (EUA). This EUA will remain  in effect (meaning this test can be used) for the duration of the COVID-19 declaration under Section 564(b)(1) of the Act, 21 U.S.C.section 360bbb-3(b)(1), unless the authorization is terminated  or revoked sooner.       Influenza A by PCR NEGATIVE NEGATIVE Final   Influenza B by PCR NEGATIVE NEGATIVE Final    Comment: (NOTE) The Xpert Xpress SARS-CoV-2/FLU/RSV plus assay is intended as an aid in the diagnosis of influenza from Nasopharyngeal swab specimens and should not be used as a sole basis for treatment. Nasal washings and aspirates are unacceptable for Xpert Xpress SARS-CoV-2/FLU/RSV testing.  Fact Sheet for Patients: EntrepreneurPulse.com.au  Fact Sheet for Healthcare Providers: IncredibleEmployment.be  This test is not yet approved or cleared by the  [place] Architectural technologist and has been authorized for detection and/or diagnosis of SARS-CoV-2 by FDA under an Print production planner (EUA). This EUA will remain in effect (meaning this test can be used) for the duration of the COVID-19 declaration under Section 564(b)(1) of the Act, 21 U.S.C. section 360bbb-3(b)(1), unless the authorization is terminated or revoked.  Performed at [facility], [address] [place], [place] [address]   Body fluid culture w Gram Stain     Status: None (Preliminary result)   Collection Time: 11/01/21  9:20 AM    Specimen: Peritoneal Washings  Result Value Ref Range Status   Specimen Description   Final    PERITONEAL Performed at [facility], [address] [place], [place] [address]    Special Requests   Final    NONE Performed at [facility], [address] [place], [place] [address]    Gram Stain   Final    WBC PRESENT, PREDOMINANTLY MONONUCLEAR NO ORGANISMS SEEN CYTOSPIN SMEAR    Culture   Final    NO GROWTH 2 DAYS Performed at [facility], [place] [address] [place], [place] [address]    Report Status PENDING  Incomplete    Signed:  Ezekiel Slocumb MD.  [facility] 11/03/2021, 10:57 AM

## 2021-11-03 NOTE — Telephone Encounter (Signed)
Put samples up at front of office and called to let patient know to just pick up

## 2021-11-03 NOTE — TOC Transition Note (Signed)
Transition of Care [facility]) - CM/SW Discharge Note   Patient Details  Name: Shawn Torres MRN: [id number] Date of Birth: 02-03-1966  Transition of Care [facility]) CM/SW Contact:  Candie Chroman, LCSW Phone Number: 11/03/2021, 12:05 PM   Clinical Narrative: Met with patient using phone interpreter Verdene Lennert [phone]). CSW introduced role and explained that discharge planning would be discussed. Patient said he does have a PCP at [facility]. Per nurse secretary, it is Osie Cheeks, MD at [facility] Added information to chart. He has an appointment with them tomorrow. Did give PCP packet and intake paperwork for [facility] in case needed in the future. Patient confirmed he does not have insurance. According to chart, Medicaid is pending. Reviewed discharge medications on GoodRx and printed off coupons. Total cost is $41.39 which patient says he can afford. GI office will give Xifaxan samples at office. No further concerns. CSW signing off.     Final next level of care: Home/Self Care Barriers to Discharge: No Barriers Identified   Patient Goals and CMS Choice        Discharge Placement                Patient to be transferred to facility by: Daughter will pick him up   Patient and family notified of of transfer: 11/03/21  Discharge Plan and Services                                     Social Determinants of Health (SDOH) Interventions     Readmission Risk Interventions Readmission Risk Prevention Plan 10/12/2021  Transportation Screening Complete  PCP or Specialist Appt within 3-5 Days Complete  HRI or [place] Not Complete  HRI or Home Care Consult comments unsure of discharge needs  Social Work Consult for [place] Planning/Counseling Complete  Palliative Care Screening Not Applicable  Medication Review Press photographer) Complete

## 2021-11-03 NOTE — Telephone Encounter (Signed)
Called patient about keeping his other appointment let him know to keep it to

## 2021-11-03 NOTE — TOC CM/SW Note (Signed)
CSW following for discharge needs. Patient does not have insurance. May need prescriptions sent to [facility]. Patient does not have a PCP. Was given application for [facility] on 11/26.  Charlynn Court, CSW [phone]

## 2021-11-04 LAB — BODY FLUID CULTURE W GRAM STAIN: Culture: NO GROWTH

## 2021-11-05 ENCOUNTER — Emergency Department: Payer: Medicaid Other

## 2021-11-05 ENCOUNTER — Inpatient Hospital Stay
Admission: EM | Admit: 2021-11-05 | Discharge: 2021-11-09 | DRG: 442 | Disposition: A | Discharge status: Home / Self Care | Payer: Medicaid Other | Attending: Family Medicine | Admitting: Family Medicine

## 2021-11-05 DIAGNOSIS — K7031 Alcoholic cirrhosis of liver with ascites: Secondary | ICD-10-CM | POA: Diagnosis present

## 2021-11-05 DIAGNOSIS — D638 Anemia in other chronic diseases classified elsewhere: Secondary | ICD-10-CM | POA: Diagnosis present

## 2021-11-05 DIAGNOSIS — E871 Hypo-osmolality and hyponatremia: Secondary | ICD-10-CM | POA: Diagnosis present

## 2021-11-05 DIAGNOSIS — F1021 Alcohol dependence, in remission: Secondary | ICD-10-CM | POA: Diagnosis present

## 2021-11-05 DIAGNOSIS — N182 Chronic kidney disease, stage 2 (mild): Secondary | ICD-10-CM | POA: Diagnosis present

## 2021-11-05 DIAGNOSIS — Z91199 Patient's noncompliance with other medical treatment and regimen due to unspecified reason: Secondary | ICD-10-CM

## 2021-11-05 DIAGNOSIS — R188 Other ascites: Secondary | ICD-10-CM

## 2021-11-05 DIAGNOSIS — Z8616 Personal history of COVID-19: Secondary | ICD-10-CM

## 2021-11-05 DIAGNOSIS — Z20822 Contact with and (suspected) exposure to covid-19: Secondary | ICD-10-CM | POA: Diagnosis present

## 2021-11-05 DIAGNOSIS — D696 Thrombocytopenia, unspecified: Secondary | ICD-10-CM | POA: Diagnosis present

## 2021-11-05 DIAGNOSIS — N400 Enlarged prostate without lower urinary tract symptoms: Secondary | ICD-10-CM

## 2021-11-05 DIAGNOSIS — N1831 Chronic kidney disease, stage 3a: Secondary | ICD-10-CM | POA: Diagnosis present

## 2021-11-05 DIAGNOSIS — K7460 Unspecified cirrhosis of liver: Secondary | ICD-10-CM

## 2021-11-05 DIAGNOSIS — E872 Acidosis, unspecified: Secondary | ICD-10-CM

## 2021-11-05 DIAGNOSIS — R4182 Altered mental status, unspecified: Secondary | ICD-10-CM

## 2021-11-05 DIAGNOSIS — K219 Gastro-esophageal reflux disease without esophagitis: Secondary | ICD-10-CM

## 2021-11-05 DIAGNOSIS — Z833 Family history of diabetes mellitus: Secondary | ICD-10-CM

## 2021-11-05 DIAGNOSIS — E8809 Other disorders of plasma-protein metabolism, not elsewhere classified: Secondary | ICD-10-CM | POA: Diagnosis present

## 2021-11-05 DIAGNOSIS — N179 Acute kidney failure, unspecified: Secondary | ICD-10-CM | POA: Diagnosis present

## 2021-11-05 DIAGNOSIS — K7682 Hepatic encephalopathy: Principal | ICD-10-CM | POA: Diagnosis present

## 2021-11-05 DIAGNOSIS — K766 Portal hypertension: Secondary | ICD-10-CM | POA: Diagnosis present

## 2021-11-05 DIAGNOSIS — I8510 Secondary esophageal varices without bleeding: Secondary | ICD-10-CM | POA: Diagnosis present

## 2021-11-05 DIAGNOSIS — D539 Nutritional anemia, unspecified: Secondary | ICD-10-CM | POA: Diagnosis present

## 2021-11-05 DIAGNOSIS — W19XXXA Unspecified fall, initial encounter: Secondary | ICD-10-CM | POA: Diagnosis present

## 2021-11-05 DIAGNOSIS — E119 Type 2 diabetes mellitus without complications: Secondary | ICD-10-CM

## 2021-11-05 DIAGNOSIS — R278 Other lack of coordination: Secondary | ICD-10-CM | POA: Diagnosis present

## 2021-11-05 DIAGNOSIS — Z79899 Other long term (current) drug therapy: Secondary | ICD-10-CM

## 2021-11-05 LAB — LACTIC ACID, PLASMA: Lactic Acid, Venous: 4.2 mmol/L (ref 0.5–1.9)

## 2021-11-05 LAB — COMPREHENSIVE METABOLIC PANEL
ALT: 31 U/L (ref 0–44)
AST: 53 U/L — ABNORMAL HIGH (ref 15–41)
Albumin: 3.4 g/dL — ABNORMAL LOW (ref 3.5–5.0)
Alkaline Phosphatase: 112 U/L (ref 38–126)
Anion gap: 9 (ref 5–15)
BUN: 38 mg/dL — ABNORMAL HIGH (ref 6–20)
CO2: 18 mmol/L — ABNORMAL LOW (ref 22–32)
Calcium: 8.6 mg/dL — ABNORMAL LOW (ref 8.9–10.3)
Chloride: 109 mmol/L (ref 98–111)
Creatinine, Ser: 1.53 mg/dL — ABNORMAL HIGH (ref 0.61–1.24)
GFR, Estimated: 54 mL/min — ABNORMAL LOW (ref >60)
Glucose, Bld: 150 mg/dL — ABNORMAL HIGH (ref 70–99)
Potassium: 4.7 mmol/L (ref 3.5–5.1)
Sodium: 136 mmol/L (ref 135–145)
Total Bilirubin: 3.7 mg/dL — ABNORMAL HIGH (ref 0.3–1.2)
Total Protein: 7.1 g/dL (ref 6.5–8.1)

## 2021-11-05 LAB — AMMONIA: Ammonia: 184 umol/L — ABNORMAL HIGH (ref 9–35)

## 2021-11-05 MED ORDER — LACTATED RINGERS IV BOLUS
1000.0000 mL | Freq: Once | INTRAVENOUS | Status: AC
  Administered 2021-11-06: 1000 mL via INTRAVENOUS

## 2021-11-05 MED ORDER — LACTULOSE 10 GM/15ML PO SOLN
30.0000 g | Freq: Once | ORAL | Status: AC
  Administered 2021-11-05: 30 g via ORAL
  Filled 2021-11-05: qty 60

## 2021-11-05 NOTE — ED Triage Notes (Signed)
Pt coming from home by EMS for increased confusion. Hx of cirrhosis and missed a dose of lactulose. Last known normal around 1200 today per family. Pt also fell and is vomiting at home.

## 2021-11-06 ENCOUNTER — Inpatient Hospital Stay: Payer: Medicaid Other

## 2021-11-06 ENCOUNTER — Other Ambulatory Visit: Payer: Self-pay

## 2021-11-06 ENCOUNTER — Encounter: Payer: Self-pay | Admitting: Family Medicine

## 2021-11-06 DIAGNOSIS — E8809 Other disorders of plasma-protein metabolism, not elsewhere classified: Secondary | ICD-10-CM | POA: Diagnosis present

## 2021-11-06 DIAGNOSIS — N400 Enlarged prostate without lower urinary tract symptoms: Secondary | ICD-10-CM | POA: Diagnosis present

## 2021-11-06 DIAGNOSIS — K7031 Alcoholic cirrhosis of liver with ascites: Secondary | ICD-10-CM | POA: Diagnosis present

## 2021-11-06 DIAGNOSIS — Z20822 Contact with and (suspected) exposure to covid-19: Secondary | ICD-10-CM | POA: Diagnosis present

## 2021-11-06 DIAGNOSIS — D696 Thrombocytopenia, unspecified: Secondary | ICD-10-CM | POA: Diagnosis present

## 2021-11-06 DIAGNOSIS — E872 Acidosis, unspecified: Secondary | ICD-10-CM

## 2021-11-06 DIAGNOSIS — E119 Type 2 diabetes mellitus without complications: Secondary | ICD-10-CM | POA: Diagnosis present

## 2021-11-06 DIAGNOSIS — K219 Gastro-esophageal reflux disease without esophagitis: Secondary | ICD-10-CM

## 2021-11-06 DIAGNOSIS — R278 Other lack of coordination: Secondary | ICD-10-CM | POA: Diagnosis present

## 2021-11-06 DIAGNOSIS — R7989 Other specified abnormal findings of blood chemistry: Secondary | ICD-10-CM

## 2021-11-06 DIAGNOSIS — Z79899 Other long term (current) drug therapy: Secondary | ICD-10-CM | POA: Diagnosis not present

## 2021-11-06 DIAGNOSIS — E871 Hypo-osmolality and hyponatremia: Secondary | ICD-10-CM | POA: Diagnosis present

## 2021-11-06 DIAGNOSIS — F1021 Alcohol dependence, in remission: Secondary | ICD-10-CM | POA: Diagnosis present

## 2021-11-06 DIAGNOSIS — K766 Portal hypertension: Secondary | ICD-10-CM | POA: Diagnosis present

## 2021-11-06 DIAGNOSIS — N179 Acute kidney failure, unspecified: Secondary | ICD-10-CM | POA: Diagnosis present

## 2021-11-06 DIAGNOSIS — Z91199 Patient's noncompliance with other medical treatment and regimen due to unspecified reason: Secondary | ICD-10-CM | POA: Diagnosis not present

## 2021-11-06 DIAGNOSIS — K7682 Hepatic encephalopathy: Principal | ICD-10-CM

## 2021-11-06 DIAGNOSIS — I8510 Secondary esophageal varices without bleeding: Secondary | ICD-10-CM | POA: Diagnosis present

## 2021-11-06 DIAGNOSIS — W19XXXA Unspecified fall, initial encounter: Secondary | ICD-10-CM | POA: Diagnosis present

## 2021-11-06 DIAGNOSIS — Z833 Family history of diabetes mellitus: Secondary | ICD-10-CM | POA: Diagnosis not present

## 2021-11-06 DIAGNOSIS — D539 Nutritional anemia, unspecified: Secondary | ICD-10-CM | POA: Diagnosis present

## 2021-11-06 DIAGNOSIS — D638 Anemia in other chronic diseases classified elsewhere: Secondary | ICD-10-CM | POA: Diagnosis present

## 2021-11-06 DIAGNOSIS — Z8616 Personal history of COVID-19: Secondary | ICD-10-CM | POA: Diagnosis not present

## 2021-11-06 LAB — CBC WITH DIFFERENTIAL/PLATELET
Abs Immature Granulocytes: 0.04 10*3/uL (ref 0.00–0.07)
Basophils Absolute: 0.1 10*3/uL (ref 0.0–0.1)
Basophils Relative: 1 %
Eosinophils Absolute: 0.1 10*3/uL (ref 0.0–0.5)
Eosinophils Relative: 2 %
HCT: 27.4 % — ABNORMAL LOW (ref 39.0–52.0)
Hemoglobin: 9.6 g/dL — ABNORMAL LOW (ref 13.0–17.0)
Immature Granulocytes: 1 %
Lymphocytes Relative: 19 %
Lymphs Abs: 1.1 10*3/uL (ref 0.7–4.0)
MCH: 35.3 pg — ABNORMAL HIGH (ref 26.0–34.0)
MCHC: 35 g/dL (ref 30.0–36.0)
MCV: 100.7 fL — ABNORMAL HIGH (ref 80.0–100.0)
Monocytes Absolute: 0.4 10*3/uL (ref 0.1–1.0)
Monocytes Relative: 6 %
Neutro Abs: 4 10*3/uL (ref 1.7–7.7)
Neutrophils Relative %: 71 %
Platelets: 149 10*3/uL — ABNORMAL LOW (ref 150–400)
RBC: 2.72 MIL/uL — ABNORMAL LOW (ref 4.22–5.81)
RDW: 14.2 % (ref 11.5–15.5)
WBC: 5.6 10*3/uL (ref 4.0–10.5)
nRBC: 0 % (ref 0.0–0.2)

## 2021-11-06 LAB — HEPATIC FUNCTION PANEL
ALT: 27 U/L (ref 0–44)
AST: 48 U/L — ABNORMAL HIGH (ref 15–41)
Albumin: 2.9 g/dL — ABNORMAL LOW (ref 3.5–5.0)
Alkaline Phosphatase: 95 U/L (ref 38–126)
Bilirubin, Direct: 0.7 mg/dL — ABNORMAL HIGH (ref 0.0–0.2)
Indirect Bilirubin: 3.1 mg/dL — ABNORMAL HIGH (ref 0.3–0.9)
Total Bilirubin: 3.8 mg/dL — ABNORMAL HIGH (ref 0.3–1.2)
Total Protein: 6.6 g/dL (ref 6.5–8.1)

## 2021-11-06 LAB — BASIC METABOLIC PANEL
Anion gap: 7 (ref 5–15)
BUN: 35 mg/dL — ABNORMAL HIGH (ref 6–20)
CO2: 20 mmol/L — ABNORMAL LOW (ref 22–32)
Calcium: 8.4 mg/dL — ABNORMAL LOW (ref 8.9–10.3)
Chloride: 111 mmol/L (ref 98–111)
Creatinine, Ser: 1.29 mg/dL — ABNORMAL HIGH (ref 0.61–1.24)
GFR, Estimated: >60 mL/min (ref >60)
Glucose, Bld: 119 mg/dL — ABNORMAL HIGH (ref 70–99)
Potassium: 4.1 mmol/L (ref 3.5–5.1)
Sodium: 138 mmol/L (ref 135–145)

## 2021-11-06 LAB — PROTIME-INR
INR: 2 — ABNORMAL HIGH (ref 0.8–1.2)
Prothrombin Time: 22.9 seconds — ABNORMAL HIGH (ref 11.4–15.2)

## 2021-11-06 LAB — LACTIC ACID, PLASMA
Lactic Acid, Venous: 2.8 mmol/L (ref 0.5–1.9)
Lactic Acid, Venous: 3.9 mmol/L (ref 0.5–1.9)

## 2021-11-06 LAB — CBC
HCT: 23.7 % — ABNORMAL LOW (ref 39.0–52.0)
Hemoglobin: 8.5 g/dL — ABNORMAL LOW (ref 13.0–17.0)
MCH: 36.2 pg — ABNORMAL HIGH (ref 26.0–34.0)
MCHC: 35.9 g/dL (ref 30.0–36.0)
MCV: 100.9 fL — ABNORMAL HIGH (ref 80.0–100.0)
Platelets: 139 10*3/uL — ABNORMAL LOW (ref 150–400)
RBC: 2.35 MIL/uL — ABNORMAL LOW (ref 4.22–5.81)
RDW: 14.2 % (ref 11.5–15.5)
WBC: 6.4 10*3/uL (ref 4.0–10.5)
nRBC: 0 % (ref 0.0–0.2)

## 2021-11-06 LAB — RESP PANEL BY RT-PCR (FLU A&B, COVID) ARPGX2
Influenza A by PCR: NEGATIVE
Influenza B by PCR: NEGATIVE
SARS Coronavirus 2 by RT PCR: NEGATIVE

## 2021-11-06 LAB — AMMONIA: Ammonia: 103 umol/L — ABNORMAL HIGH (ref 9–35)

## 2021-11-06 LAB — CBG MONITORING, ED
Glucose-Capillary: 168 mg/dL — ABNORMAL HIGH (ref 70–99)
Glucose-Capillary: 176 mg/dL — ABNORMAL HIGH (ref 70–99)
Glucose-Capillary: 189 mg/dL — ABNORMAL HIGH (ref 70–99)

## 2021-11-06 LAB — GLUCOSE, CAPILLARY: Glucose-Capillary: 120 mg/dL — ABNORMAL HIGH (ref 70–99)

## 2021-11-06 LAB — TROPONIN I (HIGH SENSITIVITY): Troponin I (High Sensitivity): 10 ng/L (ref <18)

## 2021-11-06 MED ORDER — RIFAXIMIN 550 MG PO TABS
550.0000 mg | ORAL_TABLET | Freq: Two times a day (BID) | ORAL | Status: DC
  Administered 2021-11-06 – 2021-11-09 (×8): 550 mg via ORAL
  Filled 2021-11-06 (×9): qty 1

## 2021-11-06 MED ORDER — ONDANSETRON 4 MG PO TBDP: 4.0000 mg | ORAL_TABLET | Freq: Three times a day (TID) | ORAL | Status: DC | PRN

## 2021-11-06 MED ORDER — INSULIN ASPART 100 UNIT/ML IJ SOLN
0.0000 [IU] | Freq: Three times a day (TID) | INTRAMUSCULAR | Status: DC
Start: 2021-11-06 — End: 2021-11-07
  Administered 2021-11-06: 2 [IU] via SUBCUTANEOUS
  Filled 2021-11-06: qty 1

## 2021-11-06 MED ORDER — THIAMINE HCL 100 MG PO TABS
100.0000 mg | ORAL_TABLET | Freq: Every day | ORAL | Status: DC
  Administered 2021-11-06 – 2021-11-09 (×4): 100 mg via ORAL
  Filled 2021-11-06 (×4): qty 1

## 2021-11-06 MED ORDER — ENOXAPARIN SODIUM 40 MG/0.4ML IJ SOSY: 40.0000 mg | PREFILLED_SYRINGE | INTRAMUSCULAR | Status: DC

## 2021-11-06 MED ORDER — ENSURE ENLIVE PO LIQD
237.0000 mL | Freq: Three times a day (TID) | ORAL | Status: DC
  Administered 2021-11-07 – 2021-11-08 (×4): 237 mL via ORAL

## 2021-11-06 MED ORDER — ADULT MULTIVITAMIN W/MINERALS CH
1.0000 | ORAL_TABLET | Freq: Every day | ORAL | Status: DC
Start: 2021-11-06 — End: 2021-11-09
  Administered 2021-11-06 – 2021-11-09 (×4): 1 via ORAL
  Filled 2021-11-06 (×4): qty 1

## 2021-11-06 MED ORDER — TAMSULOSIN HCL 0.4 MG PO CAPS
0.4000 mg | ORAL_CAPSULE | Freq: Every day | ORAL | Status: DC
  Administered 2021-11-06 – 2021-11-09 (×4): 0.4 mg via ORAL
  Filled 2021-11-06 (×4): qty 1

## 2021-11-06 MED ORDER — PANTOPRAZOLE SODIUM 40 MG PO TBEC
40.0000 mg | DELAYED_RELEASE_TABLET | Freq: Every day | ORAL | Status: DC
  Administered 2021-11-06 – 2021-11-09 (×4): 40 mg via ORAL
  Filled 2021-11-06 (×4): qty 1

## 2021-11-06 MED ORDER — SODIUM CHLORIDE 0.9 % IV BOLUS
250.0000 mL | Freq: Once | INTRAVENOUS | Status: AC
  Administered 2021-11-06: 250 mL via INTRAVENOUS

## 2021-11-06 MED ORDER — ALBUMIN HUMAN 25 % IV SOLN
25.0000 g | Freq: Once | INTRAVENOUS | Status: DC | PRN
  Filled 2021-11-06: qty 100

## 2021-11-06 MED ORDER — PROSOURCE PLUS PO LIQD
30.0000 mL | Freq: Two times a day (BID) | ORAL | Status: DC
  Administered 2021-11-07 – 2021-11-09 (×3): 30 mL via ORAL
  Filled 2021-11-06 (×7): qty 30

## 2021-11-06 MED ORDER — SODIUM CHLORIDE 0.9 % IV SOLN: 2.0000 g | INTRAVENOUS | Status: DC

## 2021-11-06 MED ORDER — FUROSEMIDE 40 MG PO TABS
40.0000 mg | ORAL_TABLET | Freq: Every day | ORAL | Status: DC
  Administered 2021-11-06 – 2021-11-09 (×4): 40 mg via ORAL
  Filled 2021-11-06 (×4): qty 1

## 2021-11-06 MED ORDER — TRAZODONE HCL 50 MG PO TABS
25.0000 mg | ORAL_TABLET | Freq: Every evening | ORAL | Status: DC | PRN
  Administered 2021-11-07: 20:00:00 25 mg via ORAL
  Filled 2021-11-06: qty 1

## 2021-11-06 MED ORDER — FOLIC ACID 1 MG PO TABS
1.0000 mg | ORAL_TABLET | Freq: Every day | ORAL | Status: DC
  Administered 2021-11-06 – 2021-11-09 (×4): 1 mg via ORAL
  Filled 2021-11-06 (×4): qty 1

## 2021-11-06 MED ORDER — SPIRONOLACTONE 25 MG PO TABS
100.0000 mg | ORAL_TABLET | Freq: Every day | ORAL | Status: DC
  Administered 2021-11-06 – 2021-11-09 (×4): 100 mg via ORAL
  Filled 2021-11-06 (×3): qty 4

## 2021-11-06 MED ORDER — SODIUM CHLORIDE 0.9 % IV SOLN: INTRAVENOUS | Status: DC

## 2021-11-06 MED ORDER — LACTULOSE 10 GM/15ML PO SOLN
30.0000 g | Freq: Three times a day (TID) | ORAL | Status: DC
  Administered 2021-11-06 – 2021-11-09 (×10): 30 g via ORAL
  Filled 2021-11-06 (×10): qty 60

## 2021-11-06 MED ORDER — MAGNESIUM HYDROXIDE 400 MG/5ML PO SUSP
30.0000 mL | Freq: Every day | ORAL | Status: DC | PRN
  Filled 2021-11-06: qty 30

## 2021-11-06 MED ORDER — ONDANSETRON HCL 4 MG PO TABS: 4.0000 mg | ORAL_TABLET | Freq: Four times a day (QID) | ORAL | Status: DC | PRN

## 2021-11-06 MED ORDER — LACTULOSE 10 GM/15ML PO SOLN: 15.0000 g | Freq: Two times a day (BID) | ORAL | Status: DC

## 2021-11-06 MED ORDER — ACETAMINOPHEN 325 MG PO TABS: 650.0000 mg | ORAL_TABLET | Freq: Four times a day (QID) | ORAL | Status: DC | PRN

## 2021-11-06 MED ORDER — ACETAMINOPHEN 650 MG RE SUPP: 650.0000 mg | Freq: Four times a day (QID) | RECTAL | Status: DC | PRN

## 2021-11-06 MED ORDER — ONDANSETRON HCL 4 MG/2ML IJ SOLN
4.0000 mg | Freq: Four times a day (QID) | INTRAMUSCULAR | Status: DC | PRN
  Administered 2021-11-07: 04:00:00 4 mg via INTRAVENOUS
  Filled 2021-11-06: qty 2

## 2021-11-06 NOTE — ED Notes (Signed)
Informed RN bed assigned 

## 2021-11-06 NOTE — Consult Note (Signed)
Inpatient Consultation   Patient ID: Shawn Torres is a 55 y.o. male.  Requesting Provider: Dr. 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  Daughter is at bedside who helps provide translation and history.  Per the daughter the patient had become more confused and lethargic at home and was not as conversant.  She already notes an improvement since taking more lactulose here at the hospital.  She reports that the patient had not been taking his lactulose as prescribed.  He denies any fever or chills, hematemesis coffee-ground emesis nausea vomiting melena and hematochezia.  Denies abdominal pain but feels like his abdomen is more distended.  He has been watching his liquid intake.  He is scheduled for an outpatient paracentesis on Monday.  He has gastroenterology follow-up towards the end of the month.  He was last seen in the hospital in November for variceal bleeding which he underwent banding.  Daughter notes he has been sober for 7 months.   Denies NSAIDs, Anti-plt agents, and anticoagulants Denies family history of gastrointestinal disease and malignancy Previous Endoscopies: EGD 09/2021-grade 3 esophageal varices with stigmata of recent bleeding status post EVL   Past Medical History:  Diagnosis Date   Cirrhosis, alcoholic (HCC)     Past Surgical History:  Procedure Laterality Date   ESOPHAGOGASTRODUODENOSCOPY N/A 10/10/2021   Procedure: ESOPHAGOGASTRODUODENOSCOPY (EGD);  Surgeon: Midge Minium, MD;  Location: [facility] ENDOSCOPY;  Service: Endoscopy;  Laterality: N/A;    No Known Allergies  Family History  Family history  unknown: Yes    Social History   Tobacco Use   Smoking status: Never   Smokeless tobacco: Never  Vaping Use   Vaping Use: Never used  Substance Use Topics   Alcohol use: Yes   Drug use: Never     Pertinent GI related history and allergies were reviewed with the patient  Review of Systems  Constitutional:  Negative for activity change, appetite change, chills, diaphoresis, fatigue, fever and unexpected weight change.  HENT:  Negative for trouble swallowing and voice change.   Respiratory:  Negative for shortness of breath and wheezing.   Cardiovascular:  Negative for chest pain, palpitations and leg swelling.  Gastrointestinal:  Positive for abdominal distention. Negative for abdominal pain, anal bleeding, blood in stool, constipation, diarrhea, nausea, rectal pain and vomiting.  Musculoskeletal:  Negative for arthralgias and myalgias.  Skin:  Negative for color change and pallor.  Neurological:  Negative for dizziness, syncope and weakness.  Psychiatric/Behavioral:  Positive for confusion and decreased concentration. The patient is not nervous/anxious.        Increased fatigue  All other systems reviewed and are negative.   Medications Home Medications No current facility-administered medications on file prior to encounter.   Current Outpatient Medications on File Prior to Encounter  Medication Sig Dispense Refill   feeding supplement (ENSURE ENLIVE / ENSURE PLUS) LIQD Take 237 mLs by mouth 3 (three) times daily between meals. 237 mL 12   folic acid (FOLVITE) 1 MG tablet Take 1 tablet by mouth daily.     furosemide (LASIX) 40 MG tablet Take 1 tablet (40 mg total) by mouth daily. 30 tablet  2   lactulose (CHRONULAC) 10 GM/15ML solution Take 22.5 mLs (15 g total) by mouth 2 (two) times daily. 236 mL 0   Multiple Vitamin (MULTIVITAMIN WITH MINERALS) TABS tablet Take 1 tablet by mouth daily. 30 tablet 0   Nutritional Supplements (,FEEDING SUPPLEMENT, PROSOURCE PLUS) liquid Take 30  mLs by mouth 2 (two) times daily between meals. 887 mL 1   ondansetron (ZOFRAN-ODT) 4 MG disintegrating tablet Take 1 tablet (4 mg total) by mouth every 8 (eight) hours as needed for nausea or vomiting. 20 tablet 0   pantoprazole (PROTONIX) 40 MG tablet Take 1 tablet (40 mg total) by mouth daily. 30 tablet 0   rifaximin (XIFAXAN) 550 MG TABS tablet Take 1 tablet (550 mg total) by mouth 2 (two) times daily. 60 tablet 2   spironolactone (ALDACTONE) 100 MG tablet Take 1 tablet (100 mg total) by mouth daily. 30 tablet 2   tamsulosin (FLOMAX) 0.4 MG CAPS capsule Take 1 capsule (0.4 mg total) by mouth daily. 30 capsule 0   thiamine 100 MG tablet Take 1 tablet (100 mg total) by mouth daily. 30 tablet 0   Pertinent GI related medications were reviewed with the patient  Inpatient Medications  Current Facility-Administered Medications:    (feeding supplement) PROSource Plus liquid 30 mL, 30 mL, Oral, BID BM, Mansy, Jan A, MD   acetaminophen (TYLENOL) tablet 650 mg, 650 mg, Oral, Q6H PRN **OR** acetaminophen (TYLENOL) suppository 650 mg, 650 mg, Rectal, Q6H PRN, Mansy, Jan A, MD   albumin human 25 % solution 25 g, 25 g, Intravenous, Once PRN, Annamaria Helling, DO   cefTRIAXone (ROCEPHIN) 2 g in sodium chloride 0.9 % 100 mL IVPB, 2 g, Intravenous, Q24H, Florene Glen, A Clint Lipps., MD   enoxaparin (LOVENOX) injection 40 mg, 40 mg, Subcutaneous, Q24H, Mansy, Jan A, MD   feeding supplement (ENSURE ENLIVE / ENSURE PLUS) liquid 237 mL, 237 mL, Oral, TID BM, Mansy, Jan A, MD   folic acid (FOLVITE) tablet 1 mg, 1 mg, Oral, Daily, Mansy, Jan A, MD, 1 mg at 11/06/21 1022   furosemide (LASIX) tablet 40 mg, 40 mg, Oral, Daily, Mansy, Jan A, MD, 40 mg at 11/06/21 1022   insulin aspart (novoLOG) injection 0-9 Units, 0-9 Units, Subcutaneous, TID AC & HS, Mansy, Jan A, MD   lactulose (CHRONULAC) 10 GM/15ML solution 30 g, 30 g, Oral, TID, Mansy, Jan A, MD, 30 g at 11/06/21 1023   magnesium hydroxide (MILK OF MAGNESIA)  suspension 30 mL, 30 mL, Oral, Daily PRN, Mansy, Jan A, MD   multivitamin with minerals tablet 1 tablet, 1 tablet, Oral, Daily, Mansy, Jan A, MD, 1 tablet at 11/06/21 1023   ondansetron (ZOFRAN) tablet 4 mg, 4 mg, Oral, Q6H PRN **OR** ondansetron (ZOFRAN) injection 4 mg, 4 mg, Intravenous, Q6H PRN, Mansy, Jan A, MD   pantoprazole (PROTONIX) EC tablet 40 mg, 40 mg, Oral, Daily, Mansy, Jan A, MD, 40 mg at 11/06/21 1024   rifaximin (XIFAXAN) tablet 550 mg, 550 mg, Oral, BID, Mansy, Jan A, MD, 550 mg at 11/06/21 1045   spironolactone (ALDACTONE) tablet 100 mg, 100 mg, Oral, Daily, Mansy, Jan A, MD, 100 mg at 11/06/21 1024   tamsulosin (FLOMAX) capsule 0.4 mg, 0.4 mg, Oral, Daily, Mansy, Jan A, MD, 0.4 mg at 11/06/21 1024   thiamine tablet 100 mg, 100 mg, Oral, Daily, Mansy, Jan A, MD, 100 mg at 11/06/21 1024   traZODone (DESYREL) tablet 25 mg, 25 mg, Oral, QHS PRN, Mansy, Arvella Merles, MD  Current Outpatient Medications:    feeding supplement (ENSURE ENLIVE / ENSURE PLUS) LIQD, Take 237 mLs by mouth 3 (three) times daily between meals., Disp: 237 mL, Rfl: 12   folic acid (FOLVITE) 1 MG tablet, Take 1 tablet by mouth daily., Disp: , Rfl:    furosemide (LASIX) 40 MG tablet, Take 1 tablet (40 mg total) by mouth daily., Disp: 30 tablet, Rfl: 2   lactulose (CHRONULAC) 10 GM/15ML solution, Take 22.5 mLs (15 g total) by mouth 2 (two) times daily., Disp: 236 mL, Rfl: 0   Multiple Vitamin (MULTIVITAMIN WITH MINERALS) TABS tablet, Take 1 tablet by mouth daily., Disp: 30 tablet, Rfl: 0   Nutritional Supplements (,FEEDING SUPPLEMENT, PROSOURCE PLUS) liquid, Take 30 mLs by mouth 2 (two) times daily between meals., Disp: 887 mL, Rfl: 1   ondansetron (ZOFRAN-ODT) 4 MG disintegrating tablet, Take 1 tablet (4 mg total) by mouth every 8 (eight) hours as needed for nausea or vomiting., Disp: 20 tablet, Rfl: 0   pantoprazole (PROTONIX) 40 MG tablet, Take 1 tablet (40 mg total) by mouth daily., Disp: 30 tablet, Rfl: 0    rifaximin (XIFAXAN) 550 MG TABS tablet, Take 1 tablet (550 mg total) by mouth 2 (two) times daily., Disp: 60 tablet, Rfl: 2   spironolactone (ALDACTONE) 100 MG tablet, Take 1 tablet (100 mg total) by mouth daily., Disp: 30 tablet, Rfl: 2   tamsulosin (FLOMAX) 0.4 MG CAPS capsule, Take 1 capsule (0.4 mg total) by mouth daily., Disp: 30 capsule, Rfl: 0   thiamine 100 MG tablet, Take 1 tablet (100 mg total) by mouth daily., Disp: 30 tablet, Rfl: 0  albumin human     cefTRIAXone (ROCEPHIN)  IV      acetaminophen **OR** acetaminophen, albumin human, magnesium hydroxide, ondansetron **OR** ondansetron (ZOFRAN) IV, traZODone   Objective   Vitals:   11/05/21 2347 11/06/21 0300 11/06/21 0452 11/06/21 0932  BP:  135/73 130/86 132/89  Pulse:  73 70 73  Resp:  17 17 19   Temp: 98.1 F (36.7 C)   98 F (36.7 C)  TempSrc: Oral   Oral  SpO2:  100% 100% 100%  Weight:         Physical Exam Vitals and nursing note reviewed.  Constitutional:      General: He is not in acute distress.    Appearance: He is ill-appearing (chronically ill appearing). He is not toxic-appearing or diaphoretic.  HENT:     Head: Normocephalic and atraumatic.     Nose: Nose normal.     Mouth/Throat:     Mouth: Mucous membranes are moist.     Pharynx: Oropharynx is clear.     Comments: No sublingual jaundice appreciated Eyes:     General: Scleral icterus present.     Extraocular Movements: Extraocular movements intact.  Cardiovascular:     Rate and Rhythm: Normal rate and regular rhythm.     Heart sounds: Normal heart sounds. No murmur heard.   No friction rub. No gallop.  Pulmonary:     Effort: Pulmonary effort is normal. No respiratory distress.     Breath sounds: Normal breath sounds. No wheezing, rhonchi or rales.  Abdominal:     General: Bowel sounds are normal. There is distension (non tense).     Palpations: Abdomen is soft.     Tenderness: There is no abdominal tenderness. There is no guarding or rebound.   Musculoskeletal:     Cervical back: Neck supple.     Right lower leg: Edema present.  Left lower leg: Edema present.  Skin:    General: Skin is warm and dry.     Coloration: Skin is not jaundiced or pale.     Comments: Telangiectasias on face  Neurological:     Mental Status: He is alert and oriented to person, place, and time.     Comments: + Asterixis. Conversant  Psychiatric:        Mood and Affect: Mood normal.        Behavior: Behavior normal.        Thought Content: Thought content normal.        Judgment: Judgment normal.    Laboratory Data Recent Labs  Lab 11/03/21 0236 11/05/21 2309 11/06/21 0506  WBC 5.6 5.6 6.4  HGB 7.4* 9.6* 8.5*  HCT 20.2* 27.4* 23.7*  PLT 123* 149* 139*   Recent Labs  Lab 11/03/21 0236 11/05/21 2309 11/06/21 0506  NA 133* 136 138  K 4.3 4.7 4.1  CL 106 109 111  CO2 21* 18* 20*  BUN 39* 38* 35*  CALCIUM 8.1* 8.6* 8.4*  PROT 5.5* 7.1 6.6  BILITOT 2.1* 3.7* 3.8*  ALKPHOS 108 112 95  ALT 21 31 27   AST 35 53* 48*  GLUCOSE 141* 150* 119*   No results for input(s): INR in the last 168 hours.  No results for input(s): LIPASE in the last 72 hours.      Imaging Studies: DG Chest 2 View  Result Date: 11/05/2021 CLINICAL DATA:  Cough EXAM: CHEST - 2 VIEW COMPARISON:  10/29/2021 FINDINGS: Cardiomegaly. Unchanged mediastinal contours. Low lung volumes. Unchanged elevation of the right hemidiaphragm. No focal pulmonary opacity. The visualized skeletal structures are unremarkable. IMPRESSION: 1. Cardiomegaly. 2. No acute cardiopulmonary process. Electronically Signed   By: Wiliam KeAlison  Vasan M.D.   On: 11/05/2021 23:37   CT HEAD WO CONTRAST (5MM)  Result Date: 11/05/2021 CLINICAL DATA:  Altered mental status. EXAM: CT HEAD WITHOUT CONTRAST TECHNIQUE: Contiguous axial images were obtained from the base of the skull through the vertex without intravenous contrast. COMPARISON:  October 10, 2021 FINDINGS: Brain: No evidence of acute  infarction, hemorrhage, hydrocephalus, extra-axial collection or mass lesion/mass effect. Vascular: No hyperdense vessel or unexpected calcification. Skull: Normal. Negative for fracture or focal lesion. Sinuses/Orbits: No acute finding. Other: None. IMPRESSION: No acute intracranial pathology. Electronically Signed   By: Aram Candelahaddeus  Houston M.D.   On: 11/05/2021 23:52   US LIVER DOPPLER  Result Date: 11/06/2021 CLINICAL DATA:  55 year old male with history of alcoholic cirrhosis. EXAM: DUPLEX ULTRASOUND OF LIVER TECHNIQUE: Color and duplex Doppler ultrasound was performed to evaluate the hepatic in-flow and out-flow vessels. COMPARISON:  CT abdomen pelvis from 04/28/2021 FINDINGS: Liver: Diffusely increased parenchymal echogenicity. Nodular contour. No focal lesion, mass or intrahepatic biliary ductal dilatation. Main Portal Vein size: 1.3 cm Portal Vein Velocities (all antegrade) Main Prox:  29 cm/sec Main Mid: 22 cm/sec Main Dist:  19 cm/sec Right: 17 cm/sec Left: 26 cm/sec Hepatic Vein Velocities Right:  37 cm/sec Middle:  32 cm/sec Left:  19 cm/sec IVC: Present and patent with normal respiratory phasicity. Hepatic Artery Velocity:  73 cm/sec Splenic Vein Velocity:  16 cm/sec (antegrade) Spleen: 6.3 cm x 5.0 cm x 12.8 cm with a total volume of 210 cm^3 (411 cm^3 is upper limit normal) Portal Vein Occlusion/Thrombus: No Splenic Vein Occlusion/Thrombus: No Ascites: Large volume Varices: None IMPRESSION: Morphologic changes of hepatic cirrhosis with secondary signs of portal hypertension including large volume ascites. Patent portal veins with antegrade flow throughout.  No evidence of hepatoma. Ruthann Cancer, MD [facility]iology Electronically Signed   By: Ruthann Cancer M.D.   On: 11/06/2021 13:26    Assessment:   # Decompensated Cirrhosis 2/2 etoh abuse -MELD-NA 23 (with inr from 12/10) - sober for last 7 months per daughter - encephalopathy,  coagulopathy, hypoalbuminemia, hyperbilirubinemia, hyponatremia, esophageal varices, hyperammonemia, thrombocytopenia  # Portal HTN with sequelae - encephalopathy, coagulopathy, hypoalbuminemia, hyperbilirubinemia, hyponatremia, esophageal varices, hyperammonemia, thrombocytopenia -Last underwent EGD on November 20 with grade 3 varices with stigmata of recent bleeding status post banding.  Also noted to have a duodenal erosion at that time.  # Acute encephalopathy 2/2 above- resolving -Per daughter and patient the patient had not been adequately taking his lactulose -No signs of infection, GI bleeding, or thrombosis  # CKD  # Lactic acidosis resolving  # chronic anemia  Plan:  Lactulose to at least 3 soft BM per day Xifaxin 550mg  bid po Low sodium diet (<2g per day) and high protein diet- counseled on No signs of GIB Currently without signs of infection Avoid hepatotoxic and sedating agents Continue home aldactone 100mg  daily and Lasix 40 mg daily Continue home protonix at this time Monitor cmp, cbc, inr daily No utility in checking daily ammonia as this does not correlate with severity of encephalopathy, recommend following clinically and response to lactulose and Xifaxan Awaiting paracentesis with studies; provide 25 g of albumin per 5 L removed US Liver with doppler reviewed, no thrombosis Has scheduled follow up with personal gastroenterologist this month per daughter  I personally performed the service.  Management of other medical comorbidities as per primary team  Thank you for allowing [place] to participate in this patient's care. Please don't hesitate to call if any questions or concerns arise.   Annamaria Helling, DO [facility]  Portions of the record may have been created with voice recognition software. Occasional wrong-word or 'sound-a-like' substitutions may have occurred due to the inherent limitations of voice recognition software.  Read  the chart carefully and recognize, using context, where substitutions may have occurred.

## 2021-11-06 NOTE — H&P (Signed)
[place]   PATIENT NAME: Shawn Torres    MR#:  [id number]  DATE OF BIRTH:  1966-01-27  DATE OF ADMISSION:  11/05/2021  PRIMARY CARE PHYSICIAN: Caryl Never, MD   Patient is coming from: Home  REQUESTING/REFERRING PHYSICIAN: Rudene Re, MD  CHIEF COMPLAINT:   Chief Complaint  Patient presents with   Altered Mental Status   Fall   Emesis    HISTORY OF PRESENT ILLNESS:  Shawn Torres is a 55 y.o. male with medical history significant for alcoholic liver cirrhosis, type 2 diabetes mellitus, esophageal varices, thrombocytopenia and alcohol abuse, who presented to the emergency room with acute onset of altered mental status with confusion.  Per his family he has been declining over the last 12 hours before presentation to the ER.  He had an with his fall.  He has been having mild cough without dyspnea or wheezing.  No chest pain or palpitations.  No fever or chills.  No dysuria, oliguria or hematuria or flank pain. ED Course: When the patient came to the ER vital signs were within normal.  Labs revealed a glucose of 133 with BUN of 38 and creatinine 1.53 with calcium of 8.6 and albumin 3.4 with AST 53 and ammonia level was 184 with total bili of 3.7.  High-sensitivity troponin I was 10.  Lactic acid was 4.2 and later 2.8.  CBC showed anemia and mild thrombocytopenia.  Influenza a and B antigens as well as COVID-19 PCR came back negative. EKG as reviewed by me : EKG showed left axis deviation minimal voltage criteria for LVH. Imaging: Chest x-ray showed cardiomegaly with no acute cardiopulmonary disease.  Chest CTA revealed no acute abnormalities.  Noncontrast head CT scan revealed no acute intracranial abnormalities.  The patient was given 1 L bolus of IV lactated Ringer and 30 g of p.o. lactulose.  He will be admitted to a medically monitored bed for further evaluation and management. PAST MEDICAL HISTORY:   Past Medical History:  Diagnosis Date   Cirrhosis,  alcoholic ([place])     PAST SURGICAL HISTORY:   Past Surgical History:  Procedure Laterality Date   ESOPHAGOGASTRODUODENOSCOPY N/A 10/10/2021   Procedure: ESOPHAGOGASTRODUODENOSCOPY (EGD);  Surgeon: [name], MD;  Location: [facility] ENDOSCOPY;  Service: Endoscopy;  Laterality: N/A;    SOCIAL HISTORY:   Social History   Tobacco Use   Smoking status: Never   Smokeless tobacco: Never  Substance Use Topics   Alcohol use: Yes    FAMILY HISTORY:   Positive for diabetes mellitus.  DRUG ALLERGIES:  No Known Allergies  REVIEW OF SYSTEMS:   ROS As per history of present illness. All pertinent systems were reviewed above. Constitutional, HEENT, cardiovascular, respiratory, GI, GU, musculoskeletal, neuro, psychiatric, endocrine, integumentary and hematologic systems were reviewed and are otherwise negative/unremarkable except for positive findings mentioned above in the HPI.   MEDICATIONS AT HOME:   Prior to Admission medications   Medication Sig Start Date End Date Taking? Authorizing Provider  feeding supplement (ENSURE ENLIVE / ENSURE PLUS) LIQD Take 237 mLs by mouth 3 (three) times daily between meals. 11/03/21  Yes Nicole Kindred A, DO  folic acid (FOLVITE) 1 MG tablet Take 1 tablet by mouth daily. 09/03/21 09/03/22 Yes [provider]  furosemide (LASIX) 40 MG tablet Take 1 tablet (40 mg total) by mouth daily. 11/04/21  Yes Nicole Kindred A, DO  lactulose (CHRONULAC) 10 GM/15ML solution Take 22.5 mLs (15 g total) by mouth 2 (two) times daily.  11/03/21  Yes Pennie Banter, DO  Multiple Vitamin (MULTIVITAMIN WITH MINERALS) TABS tablet Take 1 tablet by mouth daily. 10/17/21 11/16/21 Yes Leeroy Bock, MD  Nutritional Supplements (,FEEDING SUPPLEMENT, PROSOURCE PLUS) liquid Take 30 mLs by mouth 2 (two) times daily between meals. 11/03/21  Yes Esaw Grandchild A, DO  ondansetron (ZOFRAN-ODT) 4 MG disintegrating tablet Take 1 tablet (4 mg total) by mouth every 8  (eight) hours as needed for nausea or vomiting. 10/16/21  Yes Leeroy Bock, MD  pantoprazole (PROTONIX) 40 MG tablet Take 1 tablet (40 mg total) by mouth daily. 10/17/21 11/16/21 Yes Leeroy Bock, MD  rifaximin (XIFAXAN) 550 MG TABS tablet Take 1 tablet (550 mg total) by mouth 2 (two) times daily. 11/03/21  Yes Esaw Grandchild A, DO  spironolactone (ALDACTONE) 100 MG tablet Take 1 tablet (100 mg total) by mouth daily. 11/04/21  Yes Esaw Grandchild A, DO  tamsulosin (FLOMAX) 0.4 MG CAPS capsule Take 1 capsule (0.4 mg total) by mouth daily. 10/17/21  Yes Leeroy Bock, MD  thiamine 100 MG tablet Take 1 tablet (100 mg total) by mouth daily. 10/16/21  Yes Leeroy Bock, MD      VITAL SIGNS:  Blood pressure 139/85, pulse 91, temperature 98.1 F (36.7 C), temperature source Oral, resp. rate 18, weight 77.8 kg, SpO2 100 %.  PHYSICAL EXAMINATION:  Physical Exam  GENERAL:  55 y.o.-year-old Hispanic male patient lying in the bed with no acute distress.  He is only alert and oriented times self. EYES: Pupils equal, round, reactive to light and accommodation. No scleral icterus. Extraocular muscles intact.  HEENT: Head atraumatic, normocephalic. Oropharynx and nasopharynx clear.  NECK:  Supple, no jugular venous distention. No thyroid enlargement, no tenderness.  LUNGS: Normal breath sounds bilaterally, no wheezing, rales,rhonchi or crepitation. No use of accessory muscles of respiration.  CARDIOVASCULAR: Regular rate and rhythm, S1, S2 normal. No murmurs, rubs, or gallops.  ABDOMEN: Soft, nondistended, nontender. Bowel sounds present. No organomegaly or mass.  EXTREMITIES: No pedal edema, cyanosis, or clubbing.  NEUROLOGIC: Cranial nerves II through XII are intact. Muscle strength 5/5 in all extremities. Sensation intact. Gait not checked.  PSYCHIATRIC: The patient is alert and oriented x 1 to himself only.  Normal affect and good eye contact. SKIN: No obvious rash, lesion,  or ulcer.   LABORATORY PANEL:   CBC Recent Labs  Lab 11/05/21 2309  WBC 5.6  HGB 9.6*  HCT 27.4*  PLT 149*   ------------------------------------------------------------------------------------------------------------------  Chemistries  Recent Labs  Lab 11/05/21 2309  NA 136  K 4.7  CL 109  CO2 18*  GLUCOSE 150*  BUN 38*  CREATININE 1.53*  CALCIUM 8.6*  AST 53*  ALT 31  ALKPHOS 112  BILITOT 3.7*   ------------------------------------------------------------------------------------------------------------------  Cardiac Enzymes No results for input(s): TROPONINI in the last 168 hours. ------------------------------------------------------------------------------------------------------------------  RADIOLOGY:  DG Chest 2 View  Result Date: 11/05/2021 CLINICAL DATA:  Cough EXAM: CHEST - 2 VIEW COMPARISON:  10/29/2021 FINDINGS: Cardiomegaly. Unchanged mediastinal contours. Low lung volumes. Unchanged elevation of the right hemidiaphragm. No focal pulmonary opacity. The visualized skeletal structures are unremarkable. IMPRESSION: 1. Cardiomegaly. 2. No acute cardiopulmonary process. Electronically Signed   By: Wiliam Ke M.D.   On: 11/05/2021 23:37   CT HEAD WO CONTRAST ( )  Result Date: 11/05/2021 CLINICAL DATA:  Altered mental status. EXAM: CT HEAD WITHOUT CONTRAST TECHNIQUE: Contiguous axial images were obtained from the base of the skull through the vertex without intravenous contrast. COMPARISON:  October 10, 2021 FINDINGS: Brain: No evidence of acute infarction, hemorrhage, hydrocephalus, extra-axial collection or mass lesion/mass effect. Vascular: No hyperdense vessel or unexpected calcification. Skull: Normal. Negative for fracture or focal lesion. Sinuses/Orbits: No acute finding. Other: None. IMPRESSION: No acute intracranial pathology. Electronically Signed   By: Virgina Norfolk M.D.   On: 11/05/2021 23:52      IMPRESSION AND PLAN:  Principal  Problem:   Hepatic encephalopathy  1.  Acute hepatic encephalopathy due to alcoholic liver cirrhosis. - The patient will be admitted to a medical telemetry monitored bed. - We will continue p.o. lactulose 3 times a day. - We will follow serial ammonia levels. - We will continue his rifaximin and Aldactone. - We will obtain a GI consultation in AM. - I notified Dr. Virgina Jock about the patient.  2.  Acute kidney injury, likely secondary to volume depletion and dehydration. - We will hydrate the patient with normal saline and follow BMP.  3.  Elevated lactic acid level. - This like secondary to above.  It has been coming down with above management.  4.  GERD. - We will continue PPI therapy.  5.  Alcohol abuse. - We will continue thiamine and multivitamins and folate.  6.  BPH. - We will continue Flomax.  7.  Type 2 diabetes mellitus without complications apparently diet managed. - The patient will be placed on supplement coverage with NovoLog.  DVT prophylaxis: Lovenox. Code Status: full code. Family Communication:  The plan of care was discussed in details with the patient (and family). I answered all questions. The patient agreed to proceed with the above mentioned plan. Further management will depend upon hospital course. Disposition Plan: Back to previous home environment Consults called: Gastroenterology. All the records are reviewed and case discussed with ED provider.  Status is: Inpatient   Remains inpatient appropriate because:Ongoing diagnostic testing needed not appropriate for outpatient work up, Unsafe d/c plan, IV treatments appropriate due to intensity of illness or inability to take PO, and Inpatient level of care appropriate due to severity of illness   Dispo: The patient is from: Home              Anticipated d/c is to: Home              Patient currently is not medically stable to d/c.              Difficult to place patient: No     Christel Mormon M.D on  11/06/2021 at 1:08 AM  [facility]   From 7 PM-7 AM, contact night-coverage [email or web address]  CC: Primary care physician; Caryl Never, MD

## 2021-11-06 NOTE — Assessment & Plan Note (Addendum)
A1c 4.4

## 2021-11-06 NOTE — Progress Notes (Signed)
PROGRESS NOTE    Shawn Torres  SHF:[id number] DOB: [birth year] DOA: 11/05/2021 PCP: Kurtis Bushman, MD  Chief Complaint  Patient presents with   Altered Mental Status   Fall   Emesis    Brief Narrative:  55 yo with hx etoh cirrhosis, T2DM, esophageal varices, thrombocytopenia, and etoh abuse who presented with hepatic encephalopathy.  Per GI discussion with daughter, he'd not been taking his lactulose as prescribed.    Assessment & Plan:   Principal Problem:   Hepatic encephalopathy Active Problems:   Alcoholic cirrhosis of liver with ascites (HCC)   AKI (acute kidney injury) (HCC)   Lactic acidosis   GERD (gastroesophageal reflux disease)   BPH (benign prostatic hyperplasia)   Type 2 diabetes mellitus, without long-term current use of insulin (HCC)   * Hepatic encephalopathy Appreciate GI assistance Apparently not taking meds appropriately per GI discussion with daughter Continue lactulose/rifaximin Asterixis on exam Plan for para, considered abx, though will hold off with thought that cause was medication nonadherence  Alcoholic cirrhosis of liver with ascites (HCC) Appreciate GI recs Lactulose, rifaximin Low sodium diet, lasix, spironolactone US liver doppler with patent portal veins with antegrade flow throughout, hepatic cirrhosis   AKI (acute kidney injury) (HCC) Improving, he's overloaded with ascites Continue diuretics  Lactic acidosis Likely related to poor clearance with cirrhosis, possibly intravascularly depleted, follow  GERD (gastroesophageal reflux disease) PPI  BPH (benign prostatic hyperplasia) flomax  Type 2 diabetes mellitus, without long-term current use of insulin (HCC) SSI   DVT prophylaxis: lovenox Code Status: full Family Communication: none at bedside Disposition:   Status is: Inpatient  Remains inpatient appropriate because: need for paracentesis, improvement in mental status       Consultants:  GI  Procedures:   none  Antimicrobials:  Anti-infectives (From admission, onward)    Start     Dose/Rate Route Frequency Ordered Stop   11/06/21 1300  cefTRIAXone (ROCEPHIN) 2 g in sodium chloride 0.9 % 100 mL IVPB  Status:  Discontinued        2 g 200 mL/hr over 30 Minutes Intravenous Every 24 hours 11/06/21 1229 11/06/21 1340   11/06/21 0115  rifaximin (XIFAXAN) tablet 550 mg        550 mg Oral 2 times daily 11/06/21 0106         Subjective: States he was taking meds correctly, but daughter says otherwise based on GI notes Still mildly confused Interpreter used  Objective: Vitals:   11/05/21 2347 11/06/21 0300 11/06/21 0452 11/06/21 0932  BP:  135/73 130/86 132/89  Pulse:  73 70 73  Resp:  17 17 19   Temp: 98.1 F (36.7 C)   98 F (36.7 C)  TempSrc: Oral   Oral  SpO2:  100% 100% 100%  Weight:        Intake/Output Summary (Last 24 hours) at 11/06/2021 1420 Last data filed at 11/06/2021 1357 Gross per 24 hour  Intake 2000 ml  Output --  Net 2000 ml   Filed Weights   11/05/21 2313  Weight: 77.8 kg    Examination:  General exam: Appears calm and comfortable  Respiratory system: unlabored Cardiovascular system: RRR Gastrointestinal system: distended, nontender Central nervous system: confused, but seems to be improving - asterixis on exam Psychiatry: confused    Data Reviewed: I have personally reviewed following labs and imaging studies  CBC: Recent Labs  Lab 11/01/21 0441 11/02/21 0235 11/03/21 0236 11/05/21 2309 11/06/21 0506  WBC 6.2 5.7 5.6 5.6 6.4  NEUTROABS  --   --   --  4.0  --   HGB 7.6* 7.3* 7.4* 9.6* 8.5*  HCT 21.5* 20.4* 20.2* 27.4* 23.7*  MCV 99.1 100.5* 102.0* 100.7* 100.9*  PLT 124* 120* 123* 149* 139*    Basic Metabolic Panel: Recent Labs  Lab 11/01/21 0441 11/02/21 0235 11/03/21 0236 11/05/21 2309 11/06/21 0506  NA 130* 131* 133* 136 138  K 4.2 4.5 4.3 4.7 4.1  CL 105 106 106 109 111  CO2 19* 20* 21* 18* 20*  GLUCOSE 111* 157* 141*  150* 119*  BUN 32* 35* 39* 38* 35*  CREATININE 1.49* 1.51* 1.24 1.53* 1.29*  CALCIUM 7.8* 8.0* 8.1* 8.6* 8.4*    GFR: Estimated Creatinine Clearance: 63.7 mL/min (Irelynn Schermerhorn) (by C-G formula based on SCr of 1.29 mg/dL (H)).  Liver Function Tests: Recent Labs  Lab 11/01/21 0441 11/02/21 0235 11/03/21 0236 11/05/21 2309 11/06/21 0506  AST 40 39 35 53* 48*  ALT 23 23 21 31 27   ALKPHOS 107 107 108 112 95  BILITOT 2.4* 2.0* 2.1* 3.7* 3.8*  PROT 5.5* 5.8* 5.5* 7.1 6.6  ALBUMIN 2.0* 2.6* 2.9* 3.4* 2.9*    CBG: Recent Labs  Lab 11/06/21 1212  GLUCAP 168*     Recent Results (from the past 240 hour(s))  Resp Panel by RT-PCR (Flu Anav Lammert&B, Covid) Nasopharyngeal Swab     Status: None   Collection Time: 10/29/21  2:59 PM   Specimen: Nasopharyngeal Swab; Nasopharyngeal(NP) swabs in vial transport medium  Result Value Ref Range Status   SARS Coronavirus 2 by RT PCR NEGATIVE NEGATIVE Final    Comment: (NOTE) SARS-CoV-2 target nucleic acids are NOT DETECTED.  The SARS-CoV-2 RNA is generally detectable in upper respiratory specimens during the acute phase of infection. The lowest concentration of SARS-CoV-2 viral copies this assay can detect is 138 copies/mL. Mckenna Gamm negative result does not preclude SARS-Cov-2 infection and should not be used as the sole basis for treatment or other patient management decisions. Jynesis Nakamura negative result may occur with  improper specimen collection/handling, submission of specimen other than nasopharyngeal swab, presence of viral mutation(s) within the areas targeted by this assay, and inadequate number of viral copies(<138 copies/mL). Tin Engram negative result must be combined with clinical observations, patient history, and epidemiological information. The expected result is Negative.  Fact Sheet for Patients:  14/09/22  Fact Sheet for Healthcare Providers:  BloggerCourse.com  This test is no t yet approved or  cleared by the [place] FDA and  has been authorized for detection and/or diagnosis of SARS-CoV-2 by FDA under an Emergency Use Authorization (EUA). This EUA will remain  in effect (meaning this test can be used) for the duration of the COVID-19 declaration under Section 564(b)(1) of the Act, 21 U.S.C.section 360bbb-3(b)(1), unless the authorization is terminated  or revoked sooner.       Influenza Edynn Gillock by PCR NEGATIVE NEGATIVE Final   Influenza B by PCR NEGATIVE NEGATIVE Final    Comment: (NOTE) The Xpert Xpress SARS-CoV-2/FLU/RSV plus assay is intended as an aid in the diagnosis of influenza from Nasopharyngeal swab specimens and should not be used as Gayla Benn sole basis for treatment. Nasal washings and aspirates are unacceptable for Xpert Xpress SARS-CoV-2/FLU/RSV testing.  Fact Sheet for Patients: [place]  Fact Sheet for Healthcare Providers: BloggerCourse.com  This test is not yet approved or cleared by the [place] FDA and has been authorized for detection and/or diagnosis of SARS-CoV-2 by FDA under an Emergency Use Authorization (EUA). This EUA will remain in effect (meaning this test can be used)  for the duration of the COVID-19 declaration under Section 564(b)(1) of the Act, 21 U.S.C. section 360bbb-3(b)(1), unless the authorization is terminated or revoked.  Performed at [facility], [address] [place], [place] [address]   Body fluid culture w Gram Stain     Status: None   Collection Time: 11/01/21  9:20 AM   Specimen: Peritoneal Washings  Result Value Ref Range Status   Specimen Description   Final    PERITONEAL Performed at [facility], [address], [place], [place] [address]    Special Requests   Final    NONE Performed at [facility], [address] [place], [place] [address]    Gram Stain   Final    WBC PRESENT, PREDOMINANTLY MONONUCLEAR NO ORGANISMS  SEEN CYTOSPIN SMEAR    Culture   Final    NO GROWTH 3 DAYS Performed at [facility], [address] [place], [place] [address]    Report Status 11/04/2021 FINAL  Final  Resp Panel by RT-PCR (Flu Kimber Esterly&B, Covid) Nasopharyngeal Swab     Status: None   Collection Time: 11/05/21 11:43 PM   Specimen: Nasopharyngeal Swab; Nasopharyngeal(NP) swabs in vial transport medium  Result Value Ref Range Status   SARS Coronavirus 2 by RT PCR NEGATIVE NEGATIVE Final    Comment: (NOTE) SARS-CoV-2 target nucleic acids are NOT DETECTED.  The SARS-CoV-2 RNA is generally detectable in upper respiratory specimens during the acute phase of infection. The lowest concentration of SARS-CoV-2 viral copies this assay can detect is 138 copies/mL. Aadith Raudenbush negative result does not preclude SARS-Cov-2 infection and should not be used as the sole basis for treatment or other patient management decisions. Jahzara Slattery negative result may occur with  improper specimen collection/handling, submission of specimen other than nasopharyngeal swab, presence of viral mutation(s) within the areas targeted by this assay, and inadequate number of viral copies(<138 copies/mL). Ejay Lashley negative result must be combined with clinical observations, patient history, and epidemiological information. The expected result is Negative.  Fact Sheet for Patients:  BloggerCourse.com  Fact Sheet for Healthcare Providers:  SeriousBroker.it  This test is no t yet approved or cleared by the [place] FDA and  has been authorized for detection and/or diagnosis of SARS-CoV-2 by FDA under an Emergency Use Authorization (EUA). This EUA will remain  in effect (meaning this test can be used) for the duration of the COVID-19 declaration under Section 564(b)(1) of the Act, 21 U.S.C.section 360bbb-3(b)(1), unless the authorization is terminated  or revoked sooner.       Influenza Loann Chahal by PCR NEGATIVE NEGATIVE  Final   Influenza B by PCR NEGATIVE NEGATIVE Final    Comment: (NOTE) The Xpert Xpress SARS-CoV-2/FLU/RSV plus assay is intended as an aid in the diagnosis of influenza from Nasopharyngeal swab specimens and should not be used as Kate Larock sole basis for treatment. Nasal washings and aspirates are unacceptable for Xpert Xpress SARS-CoV-2/FLU/RSV testing.  Fact Sheet for Patients: BloggerCourse.com  Fact Sheet for Healthcare Providers: SeriousBroker.it  This test is not yet approved or cleared by the [place] FDA and has been authorized for detection and/or diagnosis of SARS-CoV-2 by FDA under an Emergency Use Authorization (EUA). This EUA will remain in effect (meaning this test can be used) for the duration of the COVID-19 declaration under Section 564(b)(1) of the Act, 21 U.S.C. section 360bbb-3(b)(1), unless the authorization is terminated or revoked.  Performed at [facility], [address] [place], [place] [address]  Radiology Studies: DG Chest 2 View  Result Date: 11/05/2021 CLINICAL DATA:  Cough EXAM: CHEST - 2 VIEW COMPARISON:  10/29/2021 FINDINGS: Cardiomegaly. Unchanged mediastinal contours. Low lung volumes. Unchanged elevation of the right hemidiaphragm. No focal pulmonary opacity. The visualized skeletal structures are unremarkable. IMPRESSION: 1. Cardiomegaly. 2. No acute cardiopulmonary process. Electronically Signed   By: Wiliam Ke M.D.   On: 11/05/2021 23:37   CT HEAD WO CONTRAST ( )  Result Date: 11/05/2021 CLINICAL DATA:  Altered mental status. EXAM: CT HEAD WITHOUT CONTRAST TECHNIQUE: Contiguous axial images were obtained from the base of the skull through the vertex without intravenous contrast. COMPARISON:  October 10, 2021 FINDINGS: Brain: No evidence of acute infarction, hemorrhage, hydrocephalus, extra-axial collection or mass lesion/mass effect. Vascular: No hyperdense vessel  or unexpected calcification. Skull: Normal. Negative for fracture or focal lesion. Sinuses/Orbits: No acute finding. Other: None. IMPRESSION: No acute intracranial pathology. Electronically Signed   By: Aram Candela M.D.   On: 11/05/2021 23:52   US LIVER DOPPLER  Result Date: 11/06/2021 CLINICAL DATA:  54 year old male with history of alcoholic cirrhosis. EXAM: DUPLEX ULTRASOUND OF LIVER TECHNIQUE: Color and duplex Doppler ultrasound was performed to evaluate the hepatic in-flow and out-flow vessels. COMPARISON:  CT abdomen pelvis from 04/28/2021 FINDINGS: Liver: Diffusely increased parenchymal echogenicity. Nodular contour. No focal lesion, mass or intrahepatic biliary ductal dilatation. Main Portal Vein size: 1.3 cm Portal Vein Velocities (all antegrade) Main Prox:  29 cm/sec Main Mid: 22 cm/sec Main Dist:  19 cm/sec Right: 17 cm/sec Left: 26 cm/sec Hepatic Vein Velocities Right:  37 cm/sec Middle:  32 cm/sec Left:  19 cm/sec IVC: Present and patent with normal respiratory phasicity. Hepatic Artery Velocity:  73 cm/sec Splenic Vein Velocity:  16 cm/sec (antegrade) Spleen: 6.3 cm x 5.0 cm x 12.8 cm with Kadijah Shamoon total volume of 210 cm^3 (411 cm^3 is upper limit normal) Portal Vein Occlusion/Thrombus: No Splenic Vein Occlusion/Thrombus: No Ascites: Large volume Varices: None IMPRESSION: Morphologic changes of hepatic cirrhosis with secondary signs of portal hypertension including large volume ascites. Patent portal veins with antegrade flow throughout. No evidence of hepatoma. Marliss Coots, MD [facility] Radiology Electronically Signed   By: Marliss Coots M.D.   On: 11/06/2021 13:26        Scheduled Meds:  (feeding supplement) PROSource Plus  30 mL Oral BID BM   enoxaparin (LOVENOX) injection  40 mg Subcutaneous Q24H   feeding supplement  237 mL Oral TID BM   folic acid  1 mg Oral Daily   furosemide  40 mg Oral Daily   insulin aspart  0-9 Units  Subcutaneous TID AC & HS   lactulose  30 g Oral TID   multivitamin with minerals  1 tablet Oral Daily   pantoprazole  40 mg Oral Daily   rifaximin  550 mg Oral BID   spironolactone  100 mg Oral Daily   tamsulosin  0.4 mg Oral Daily   thiamine  100 mg Oral Daily   Continuous Infusions:  albumin human       LOS: 0 days    Time spent: over 30 min    Lacretia Nicks, MD [facility]   To contact the attending provider between 7A-7P or the covering provider during after hours 7P-7A, please log into the web site [email or web address] and access using universal [place] password for that web site. If you do not have the password, please call the hospital operator.  11/06/2021, 2:20 PM

## 2021-11-06 NOTE — Assessment & Plan Note (Addendum)
Likely related to poor clearance with cirrhosis, possibly intravascularly depleted - will not trend any more with overall improvement

## 2021-11-06 NOTE — ED Notes (Signed)
Pt ambulated to toilet to have BM. Then placed back in bed

## 2021-11-06 NOTE — Assessment & Plan Note (Addendum)
Appreciate GI assistance Apparently not taking meds appropriately per GI discussion with daughter Continue lactulose/rifaximin Asterixis improved, mental status improved No SBP on ascites fluid

## 2021-11-06 NOTE — ED Notes (Signed)
RN to bedside to introduce self to pt. Pt is awake and alert and in no distress.  °

## 2021-11-06 NOTE — ED Provider Notes (Signed)
[facility] Emergency Department Provider Note  ____________________________________________  Time seen: Approximately 12:07 AM  I have reviewed the triage vital signs and the nursing notes.   HISTORY  Chief Complaint Altered Mental Status, Fall, and Emesis   HPI Shawn Torres is a 55 y.o. male with history of alcoholic cirrhosis, varices, alcohol abuse, diabetes, thrombocytopenia, hepatic encephalopathy who presents for altered mental status from home.  Patient's mental status has started to decline over the last 12 hours per family.  He had a fall that was not witnessed at home.  They report that this is similar to prior episodes of hepatic encephalopathy.  Patient's only complaint is a cough.  He denies chest pain or shortness of breath.  He is slightly confused.  He denies abdominal pain.  Family denies any fevers or recent illnesses.  Past Medical History:  Diagnosis Date   Cirrhosis, alcoholic (HCC)     Patient Active Problem List   Diagnosis Date Noted   AKI (acute kidney injury) (HCC) 11/03/2021   Macrocytic anemia 11/03/2021   Alcoholic cirrhosis of liver with ascites (HCC) 10/29/2021   Alcohol use disorder, severe, in early remission (HCC)    Malnutrition of moderate degree 10/15/2021   Abdominal distention    Acute respiratory failure with hypoxia (HCC)    Secondary esophageal varices with bleeding (HCC)    Hematemesis 10/10/2021   Hepatic encephalopathy    Secondary esophageal varices without bleeding (HCC)    Thrombocytopenia (HCC) 04/30/2021   Type 2 diabetes mellitus, without long-term current use of insulin (HCC) 04/30/2021   Hyponatremia 05/24/2019   Pneumonia due to COVID-19 virus 05/24/2019   Transaminitis 05/24/2019   Alcohol dependence with uncomplicated withdrawal (HCC) 05/24/2019   Hypokalemia 05/24/2019   COVID-19 05/24/2019    Past Surgical History:  Procedure Laterality Date   ESOPHAGOGASTRODUODENOSCOPY N/A 10/10/2021    Procedure: ESOPHAGOGASTRODUODENOSCOPY (EGD);  Surgeon: Midge Minium, MD;  Location: [facility] ENDOSCOPY;  Service: Endoscopy;  Laterality: N/A;    Prior to Admission medications   Medication Sig Start Date End Date Taking? Authorizing Provider  feeding supplement (ENSURE ENLIVE / ENSURE PLUS) LIQD Take 237 mLs by mouth 3 (three) times daily between meals. 11/03/21  Yes Esaw Grandchild A, DO  folic acid (FOLVITE) 1 MG tablet Take 1 tablet by mouth daily. 09/03/21 09/03/22 Yes [provider]  furosemide (LASIX) 40 MG tablet Take 1 tablet (40 mg total) by mouth daily. 11/04/21  Yes Esaw Grandchild A, DO  lactulose (CHRONULAC) 10 GM/15ML solution Take 22.5 mLs (15 g total) by mouth 2 (two) times daily. 11/03/21  Yes Pennie Banter, DO  Multiple Vitamin (MULTIVITAMIN WITH MINERALS) TABS tablet Take 1 tablet by mouth daily. 10/17/21 11/16/21 Yes Leeroy Bock, MD  Nutritional Supplements (,FEEDING SUPPLEMENT, PROSOURCE PLUS) liquid Take 30 mLs by mouth 2 (two) times daily between meals. 11/03/21  Yes Esaw Grandchild A, DO  ondansetron (ZOFRAN-ODT) 4 MG disintegrating tablet Take 1 tablet (4 mg total) by mouth every 8 (eight) hours as needed for nausea or vomiting. 10/16/21  Yes Leeroy Bock, MD  pantoprazole (PROTONIX) 40 MG tablet Take 1 tablet (40 mg total) by mouth daily. 10/17/21 11/16/21 Yes Leeroy Bock, MD  rifaximin (XIFAXAN) 550 MG TABS tablet Take 1 tablet (550 mg total) by mouth 2 (two) times daily. 11/03/21  Yes Esaw Grandchild A, DO  spironolactone (ALDACTONE) 100 MG tablet Take 1 tablet (100 mg total) by mouth daily. 11/04/21  Yes Pennie Banter, DO  tamsulosin (FLOMAX) 0.4 MG CAPS capsule Take 1 capsule (0.4 mg total) by mouth daily. 10/17/21  Yes Leeroy Bock, MD  thiamine 100 MG tablet Take 1 tablet (100 mg total) by mouth daily. 10/16/21  Yes Leeroy Bock, MD    Allergies Patient has no known allergies.  Family History  Family history  unknown: Yes    Social History Social History   Tobacco Use   Smoking status: Never   Smokeless tobacco: Never  Vaping Use   Vaping Use: Never used  Substance Use Topics   Alcohol use: Yes   Drug use: Never    Review of Systems  Constitutional: Negative for fever. + confusion Eyes: Negative for visual changes. ENT: Negative for sore throat. Neck: No neck pain  Cardiovascular: Negative for chest pain. Respiratory: Negative for shortness of breath. Gastrointestinal: Negative for abdominal pain, vomiting or diarrhea. Genitourinary: Negative for dysuria. Musculoskeletal: Negative for back pain. Skin: Negative for rash. Neurological: Negative for headaches, weakness or numbness. Psych: No SI or HI  ____________________________________________   PHYSICAL EXAM:  VITAL SIGNS: ED Triage Vitals  Enc Vitals Group     BP 11/05/21 2312 139/85     Pulse Rate 11/05/21 2312 91     Resp 11/05/21 2312 18     Temp 11/05/21 2347 98.1 F (36.7 C)     Temp Source 11/05/21 2347 Oral     SpO2 11/05/21 2312 100 %     Weight 11/05/21 2313 171 lb 8 oz (77.8 kg)     Height --      Head Circumference --      Peak Flow --      Pain Score --      Pain Loc --      Pain Edu? --      Excl. in GC? --     Constitutional: Alert and oriented top self only, no distress. HEENT:      Head: Normocephalic and atraumatic.         Eyes: Conjunctivae are normal. Sclera is non-icteric.       Mouth/Throat: Mucous membranes are moist.       Neck: Supple with no signs of meningismus.  No C-spine tenderness Cardiovascular: Regular rate and rhythm. No murmurs, gallops, or rubs. 2+ symmetrical distal pulses are present in all extremities. No JVD. Respiratory: Normal respiratory effort. Lungs are clear to auscultation bilaterally.  Gastrointestinal: Soft, non tender, and non distended with positive bowel sounds. No rebound or guarding. Genitourinary: No CVA tenderness. Musculoskeletal:  No edema,  cyanosis, or erythema of extremities.  No midline spine tenderness.  Full painless range of motion of all joints Neurologic: Normal speech and language. Face is symmetric. Moving all extremities. No gross focal neurologic deficits are appreciated. Skin: Skin is warm, dry and intact. No rash noted. Psychiatric: Mood and affect are normal. Speech and behavior are normal.  ____________________________________________   LABS (all labs ordered are listed, but only abnormal results are displayed)  Labs Reviewed  CBC WITH DIFFERENTIAL/PLATELET - Abnormal; Notable for the following components:      Result Value   RBC 2.72 (*)    Hemoglobin 9.6 (*)    HCT 27.4 (*)    MCV 100.7 (*)    MCH 35.3 (*)    Platelets 149 (*)    All other components within normal limits  COMPREHENSIVE METABOLIC PANEL - Abnormal; Notable for the following components:   CO2 18 (*)    Glucose, Bld 150 (*)  BUN 38 (*)    Creatinine, Ser 1.53 (*)    Calcium 8.6 (*)    Albumin 3.4 (*)    AST 53 (*)    Total Bilirubin 3.7 (*)    GFR, Estimated 54 (*)    All other components within normal limits  LACTIC ACID, PLASMA - Abnormal; Notable for the following components:   Lactic Acid, Venous 4.2 (*)    All other components within normal limits  AMMONIA - Abnormal; Notable for the following components:   Ammonia 184 (*)    All other components within normal limits  RESP PANEL BY RT-PCR (FLU A&B, COVID) ARPGX2  LACTIC ACID, PLASMA  TROPONIN I (HIGH SENSITIVITY)   ____________________________________________  EKG  ED ECG REPORT I, Nita Sickle, the attending physician, personally viewed and interpreted this ECG.  Normal sinus rhythm with a rate of 88, left axis deviation, no ST elevations or depressions ____________________________________________  RADIOLOGY  I have personally reviewed the images performed during this visit and I agree with the Radiologist's read.   Interpretation by Radiologist:  DG  Chest 2 View  Result Date: 11/05/2021 CLINICAL DATA:  Cough EXAM: CHEST - 2 VIEW COMPARISON:  10/29/2021 FINDINGS: Cardiomegaly. Unchanged mediastinal contours. Low lung volumes. Unchanged elevation of the right hemidiaphragm. No focal pulmonary opacity. The visualized skeletal structures are unremarkable. IMPRESSION: 1. Cardiomegaly. 2. No acute cardiopulmonary process. Electronically Signed   By: Wiliam Ke M.D.   On: 11/05/2021 23:37   CT HEAD WO CONTRAST ( )  Result Date: 11/05/2021 CLINICAL DATA:  Altered mental status. EXAM: CT HEAD WITHOUT CONTRAST TECHNIQUE: Contiguous axial images were obtained from the base of the skull through the vertex without intravenous contrast. COMPARISON:  October 10, 2021 FINDINGS: Brain: No evidence of acute infarction, hemorrhage, hydrocephalus, extra-axial collection or mass lesion/mass effect. Vascular: No hyperdense vessel or unexpected calcification. Skull: Normal. Negative for fracture or focal lesion. Sinuses/Orbits: No acute finding. Other: None. IMPRESSION: No acute intracranial pathology. Electronically Signed   By: Aram Candela M.D.   On: 11/05/2021 23:52     ____________________________________________   PROCEDURES  Procedure(s) performed:yes .1-3 Lead EKG Interpretation Performed by: Nita Sickle, MD Authorized by: Nita Sickle, MD     Interpretation: non-specific     ECG rate assessment: normal     Rhythm: sinus rhythm     Ectopy: none     Conduction: normal     Critical Care performed:  None ____________________________________________   INITIAL IMPRESSION / ASSESSMENT AND PLAN / ED COURSE  55 y.o. male with history of alcoholic cirrhosis, varices, alcohol abuse, diabetes, thrombocytopenia, hepatic encephalopathy who presents for altered mental status from home and a unwitnessed fall.  Patient is alert to self only, very confused but able to answer questions.  No signs of trauma on physical exam.  No  neurological deficits.  Due to the fall CT head was done.  Reviewed by me with no signs of acute traumatic injuries, confirmed by radiology.  Patient was complaining of a cough therefore COVID, flu and chest x-ray done.  Chest x-ray is negative for pneumonia.  Patient has normal work of breathing normal sats, lungs are clear to auscultation.  Normal vital signs.  Normal white count.  Labs show no significant electrolyte derangements and stable kidney function.  Lactic's elevated 4.2.  Patient looks dehydrated on exam.  Ammonia is high at 184.  Presentation concerning for hepatic encephalopathy.  We will start IV hydration and lactulose.  Discussed with Dr. Arville Care for admission.  Old medical records review including patient's most recent admission from a week ago.      _____________________________________________ Please note:  Patient was evaluated in Emergency Department today for the symptoms described in the history of present illness. Patient was evaluated in the context of the global COVID-19 pandemic, which necessitated consideration that the patient might be at risk for infection with the SARS-CoV-2 virus that causes COVID-19. Institutional protocols and algorithms that pertain to the evaluation of patients at risk for COVID-19 are in a state of rapid change based on information released by regulatory bodies including the CDC and federal and state organizations. These policies and algorithms were followed during the patient's care in the ED.  Some ED evaluations and interventions may be delayed as a result of limited staffing during the pandemic.   [place] Controlled Substance Database was reviewed by me. ____________________________________________   FINAL CLINICAL IMPRESSION(S) / ED DIAGNOSES   Final diagnoses:  Altered mental status, unspecified altered mental status type  Hepatic encephalopathy      NEW MEDICATIONS STARTED DURING THIS VISIT:  ED Discharge Orders     None         Note:  This document was prepared using Dragon voice recognition software and may include unintentional dictation errors.    Nita Sickle, MD 11/06/21 [phone]

## 2021-11-06 NOTE — Assessment & Plan Note (Signed)
Improving, he's overloaded with ascites Continue diuretics

## 2021-11-06 NOTE — ED Notes (Signed)
GI MD at bedside

## 2021-11-06 NOTE — ED Notes (Signed)
Pt offered urinal of which he denied the need for.

## 2021-11-06 NOTE — Assessment & Plan Note (Addendum)
Appreciate GI recs Lactulose, rifaximin Low sodium diet, lasix, spironolactone -> adjust diuretics outpatient prn for volume management Needs beta blocker, discussed with GI, will defer to his outpatient GI US liver doppler with patent portal veins with antegrade flow throughout, hepatic cirrhosis S/p 5.5 L with para on 12/19, s/p albumin Follow final body fluid culture

## 2021-11-06 NOTE — Assessment & Plan Note (Signed)
PPI ?

## 2021-11-06 NOTE — Assessment & Plan Note (Signed)
flomax

## 2021-11-06 NOTE — Hospital Course (Addendum)
55 yo with hx etoh cirrhosis, T2DM, esophageal varices, thrombocytopenia, and etoh abuse who presented with hepatic encephalopathy.  Per GI discussion with daughter, he'd not been taking his lactulose as prescribed.  He's improved with administration of lactulose and rifaximin here.  He's s/p paracentesis, no evidence SBP.  He was appropriate for discharge on 12/20 and discharged home with plan for daughter to assist with meds.

## 2021-11-07 LAB — CBC WITH DIFFERENTIAL/PLATELET
Abs Immature Granulocytes: 0.02 10*3/uL (ref 0.00–0.07)
Basophils Absolute: 0.1 10*3/uL (ref 0.0–0.1)
Basophils Relative: 2 %
Eosinophils Absolute: 0.2 10*3/uL (ref 0.0–0.5)
Eosinophils Relative: 4 %
HCT: 23.7 % — ABNORMAL LOW (ref 39.0–52.0)
Hemoglobin: 8.8 g/dL — ABNORMAL LOW (ref 13.0–17.0)
Immature Granulocytes: 0 %
Lymphocytes Relative: 34 %
Lymphs Abs: 1.9 10*3/uL (ref 0.7–4.0)
MCH: 38.6 pg — ABNORMAL HIGH (ref 26.0–34.0)
MCHC: 37.1 g/dL — ABNORMAL HIGH (ref 30.0–36.0)
MCV: 103.9 fL — ABNORMAL HIGH (ref 80.0–100.0)
Monocytes Absolute: 0.6 10*3/uL (ref 0.1–1.0)
Monocytes Relative: 11 %
Neutro Abs: 2.7 10*3/uL (ref 1.7–7.7)
Neutrophils Relative %: 49 %
Platelets: 137 10*3/uL — ABNORMAL LOW (ref 150–400)
RBC: 2.28 MIL/uL — ABNORMAL LOW (ref 4.22–5.81)
RDW: 14.4 % (ref 11.5–15.5)
WBC: 5.5 10*3/uL (ref 4.0–10.5)
nRBC: 0 % (ref 0.0–0.2)

## 2021-11-07 LAB — COMPREHENSIVE METABOLIC PANEL
ALT: 27 U/L (ref 0–44)
AST: 43 U/L — ABNORMAL HIGH (ref 15–41)
Albumin: 2.8 g/dL — ABNORMAL LOW (ref 3.5–5.0)
Alkaline Phosphatase: 109 U/L (ref 38–126)
Anion gap: 7 (ref 5–15)
BUN: 34 mg/dL — ABNORMAL HIGH (ref 6–20)
CO2: 17 mmol/L — ABNORMAL LOW (ref 22–32)
Calcium: 8.4 mg/dL — ABNORMAL LOW (ref 8.9–10.3)
Chloride: 111 mmol/L (ref 98–111)
Creatinine, Ser: 1.22 mg/dL (ref 0.61–1.24)
GFR, Estimated: >60 mL/min (ref >60)
Glucose, Bld: 187 mg/dL — ABNORMAL HIGH (ref 70–99)
Potassium: 4.2 mmol/L (ref 3.5–5.1)
Sodium: 135 mmol/L (ref 135–145)
Total Bilirubin: 2.8 mg/dL — ABNORMAL HIGH (ref 0.3–1.2)
Total Protein: 6.3 g/dL — ABNORMAL LOW (ref 6.5–8.1)

## 2021-11-07 LAB — PROTIME-INR
INR: 1.9 — ABNORMAL HIGH (ref 0.8–1.2)
Prothrombin Time: 21.9 seconds — ABNORMAL HIGH (ref 11.4–15.2)

## 2021-11-07 LAB — GLUCOSE, CAPILLARY
Glucose-Capillary: 148 mg/dL — ABNORMAL HIGH (ref 70–99)
Glucose-Capillary: 194 mg/dL — ABNORMAL HIGH (ref 70–99)
Glucose-Capillary: 165 mg/dL — ABNORMAL HIGH (ref 70–99)

## 2021-11-07 LAB — PHOSPHORUS: Phosphorus: 5 mg/dL — ABNORMAL HIGH (ref 2.5–4.6)

## 2021-11-07 LAB — MAGNESIUM: Magnesium: 2.2 mg/dL (ref 1.7–2.4)

## 2021-11-07 LAB — LACTIC ACID, PLASMA: Lactic Acid, Venous: 2 mmol/L (ref 0.5–1.9)

## 2021-11-07 MED ORDER — INSULIN ASPART 100 UNIT/ML IJ SOLN
0.0000 [IU] | Freq: Three times a day (TID) | INTRAMUSCULAR | Status: DC
  Administered 2021-11-07: 18:00:00 2 [IU] via SUBCUTANEOUS
  Administered 2021-11-08: 10:00:00 1 [IU] via SUBCUTANEOUS
  Administered 2021-11-09: 13:00:00 2 [IU] via SUBCUTANEOUS
  Administered 2021-11-09: 10:00:00 1 [IU] via SUBCUTANEOUS
  Filled 2021-11-07 (×3): qty 1

## 2021-11-07 MED ORDER — INSULIN ASPART 100 UNIT/ML IJ SOLN: 0.0000 [IU] | Freq: Every day | INTRAMUSCULAR | Status: DC

## 2021-11-07 NOTE — Progress Notes (Signed)
Inpatient Follow-up/Progress Note   Patient ID: Shawn Torres is a 55 y.o. male.  Overnight Events / Subjective Findings NAEON per nursing. Pt assessed using video medical intepreter. Pt has not had BM today but has not received lactulose per him. He feels like his confusion continues to improve as he had multiple BM yesterday. No n/v, hematemesis, coffee ground emesis, melena, or hematochezia. Awaiting his paracentesis tomorrow.  Review of Systems  Constitutional:  Negative for activity change, appetite change, chills, diaphoresis, fatigue, fever and unexpected weight change.  HENT:  Negative for trouble swallowing and voice change.   Respiratory:  Negative for shortness of breath and wheezing.   Cardiovascular:  Negative for chest pain, palpitations and leg swelling.  Gastrointestinal:  Positive for abdominal distention. Negative for abdominal pain, anal bleeding, blood in stool, constipation, diarrhea, nausea and vomiting.  Musculoskeletal:  Negative for arthralgias and myalgias.  Skin:  Negative for color change and pallor.  Neurological:  Negative for dizziness, syncope and weakness.  Psychiatric/Behavioral:  Positive for confusion and decreased concentration. The patient is not nervous/anxious.   All other systems reviewed and are negative.   Medications  Current Facility-Administered Medications:    (feeding supplement) PROSource Plus liquid 30 mL, 30 mL, Oral, BID BM, Mansy, Jan A, MD   acetaminophen (TYLENOL) tablet 650 mg, 650 mg, Oral, Q6H PRN **OR** acetaminophen (TYLENOL) suppository 650 mg, 650 mg, Rectal, Q6H PRN, Mansy, Jan A, MD   albumin human 25 % solution 25 g, 25 g, Intravenous, Once PRN, [name], DO   feeding supplement (ENSURE ENLIVE / ENSURE PLUS) liquid 237 mL, 237 mL, Oral, TID BM, Mansy, Jan A, MD   folic acid (FOLVITE) tablet 1 mg, 1 mg, Oral, Daily, Mansy, Jan A, MD, 1 mg at 11/07/21 1138   furosemide (LASIX) tablet 40 mg, 40 mg, Oral, Daily,  Mansy, Jan A, MD, 40 mg at 11/07/21 1138   insulin aspart (novoLOG) injection 0-5 Units, 0-5 Units, Subcutaneous, QHS, Powell, A Charlyn Minerva., MD   insulin aspart (novoLOG) injection 0-9 Units, 0-9 Units, Subcutaneous, TID WC, Zigmund Daniel., MD   lactulose [facility]) 10 GM/15ML solution 30 g, 30 g, Oral, TID, Mansy, Jan A, MD, 30 g at 11/07/21 1138   magnesium hydroxide (MILK OF MAGNESIA) suspension 30 mL, 30 mL, Oral, Daily PRN, Mansy, Jan A, MD   multivitamin with minerals tablet 1 tablet, 1 tablet, Oral, Daily, Mansy, Jan A, MD, 1 tablet at 11/07/21 1138   ondansetron (ZOFRAN) tablet 4 mg, 4 mg, Oral, Q6H PRN **OR** ondansetron (ZOFRAN) injection 4 mg, 4 mg, Intravenous, Q6H PRN, Mansy, Jan A, MD, 4 mg at 11/07/21 0411   pantoprazole (PROTONIX) EC tablet 40 mg, 40 mg, Oral, Daily, Mansy, Jan A, MD, 40 mg at 11/07/21 1138   rifaximin (XIFAXAN) tablet 550 mg, 550 mg, Oral, BID, Mansy, Jan A, MD, 550 mg at 11/07/21 1138   spironolactone (ALDACTONE) tablet 100 mg, 100 mg, Oral, Daily, Mansy, Jan A, MD, 100 mg at 11/07/21 1138   tamsulosin (FLOMAX) capsule 0.4 mg, 0.4 mg, Oral, Daily, Mansy, Jan A, MD, 0.4 mg at 11/07/21 1138   thiamine tablet 100 mg, 100 mg, Oral, Daily, Mansy, Jan A, MD, 100 mg at 11/07/21 1138   traZODone (DESYREL) tablet 25 mg, 25 mg, Oral, QHS PRN, Mansy, Jan A, MD  albumin human      acetaminophen **OR** acetaminophen, albumin human, magnesium hydroxide, ondansetron **OR** ondansetron (ZOFRAN) IV, traZODone   Objective  Vitals:   11/06/21 2302 11/07/21 0459 11/07/21 0845 11/07/21 1147  BP: 127/74 126/77 125/73 126/73  Pulse: 85 82 80 69  Resp: 19 16 16 16   Temp: 98.9 F (37.2 C) 98.1 F (36.7 C) 98.1 F (36.7 C) 97.6 F (36.4 C)  TempSrc: Oral  Oral Oral  SpO2: 100% 100% 100% 100%  Weight: 77.8 kg     Height: 5\' 5"  (1.651 m)       Physical Exam Vitals and nursing note reviewed.  Constitutional:      General: He is not in acute distress.     Appearance: He is ill-appearing (chronically ill appearing). He is not toxic-appearing or diaphoretic.  HENT:     Head: Normocephalic and atraumatic.     Nose: Nose normal.     Mouth/Throat:     Mouth: Mucous membranes are moist.     Pharynx: Oropharynx is clear.  Eyes:     General: Scleral icterus present.     Extraocular Movements: Extraocular movements intact.  Cardiovascular:     Rate and Rhythm: Normal rate and regular rhythm.     Heart sounds: Normal heart sounds. No murmur heard.   No friction rub. No gallop.  Pulmonary:     Effort: Pulmonary effort is normal. No respiratory distress.     Breath sounds: Normal breath sounds. No wheezing, rhonchi or rales.  Abdominal:     General: Bowel sounds are normal. There is distension (non tense).     Palpations: Abdomen is soft.     Tenderness: There is no abdominal tenderness. There is no guarding or rebound.  Musculoskeletal:     Cervical back: Neck supple.     Right lower leg: Edema present.     Left lower leg: Edema present.  Skin:    General: Skin is warm and dry.     Coloration: Skin is not jaundiced or pale.     Comments: Telangiectasias on face   Neurological:     Mental Status: He is alert.     Comments: Interacts more appropriately today. + Asterixis  Psychiatric:        Mood and Affect: Mood normal.        Behavior: Behavior normal.        Thought Content: Thought content normal.        Judgment: Judgment normal.     Laboratory Data Recent Labs  Lab 11/05/21 2309 11/06/21 0506 11/07/21 0430  WBC 5.6 6.4 5.5  HGB 9.6* 8.5* 8.8*  HCT 27.4* 23.7* 23.7*  PLT 149* 139* 137*  NEUTOPHILPCT 71  --  49  LYMPHOPCT 19  --  34  MONOPCT 6  --  11  EOSPCT 2  --  4   Recent Labs  Lab 11/05/21 2309 11/06/21 0506 11/07/21 0430  NA 136 138 135  K 4.7 4.1 4.2  CL 109 111 111  CO2 18* 20* 17*  BUN 38* 35* 34*  CREATININE 1.53* 1.29* 1.22  CALCIUM 8.6* 8.4* 8.4*  PROT 7.1 6.6 6.3*  BILITOT 3.7* 3.8* 2.8*   ALKPHOS 112 95 109  ALT 31 27 27   AST 53* 48* 43*  GLUCOSE 150* 119* 187*   Recent Labs  Lab 11/06/21 1846 11/07/21 0430  INR 2.0* 1.9*      Imaging Studies: DG Chest 2 View  Result Date: 11/05/2021 CLINICAL DATA:  Cough EXAM: CHEST - 2 VIEW COMPARISON:  10/29/2021 FINDINGS: Cardiomegaly. Unchanged mediastinal contours. Low lung volumes. Unchanged elevation of the right hemidiaphragm. No  focal pulmonary opacity. The visualized skeletal structures are unremarkable. IMPRESSION: 1. Cardiomegaly. 2. No acute cardiopulmonary process. Electronically Signed   By: Merilyn Baba M.D.   On: 11/05/2021 23:37   CT HEAD WO CONTRAST (5MM)  Result Date: 11/05/2021 CLINICAL DATA:  Altered mental status. EXAM: CT HEAD WITHOUT CONTRAST TECHNIQUE: Contiguous axial images were obtained from the base of the skull through the vertex without intravenous contrast. COMPARISON:  October 10, 2021 FINDINGS: Brain: No evidence of acute infarction, hemorrhage, hydrocephalus, extra-axial collection or mass lesion/mass effect. Vascular: No hyperdense vessel or unexpected calcification. Skull: Normal. Negative for fracture or focal lesion. Sinuses/Orbits: No acute finding. Other: None. IMPRESSION: No acute intracranial pathology. Electronically Signed   By: Virgina Norfolk M.D.   On: 11/05/2021 23:52   US LIVER DOPPLER  Result Date: 11/06/2021 CLINICAL DATA:  55 year old male with history of alcoholic cirrhosis. EXAM: DUPLEX ULTRASOUND OF LIVER TECHNIQUE: Color and duplex Doppler ultrasound was performed to evaluate the hepatic in-flow and out-flow vessels. COMPARISON:  CT abdomen pelvis from 04/28/2021 FINDINGS: Liver: Diffusely increased parenchymal echogenicity. Nodular contour. No focal lesion, mass or intrahepatic biliary ductal dilatation. Main Portal Vein size: 1.3 cm Portal Vein Velocities (all antegrade) Main Prox:  29 cm/sec Main Mid: 22 cm/sec Main Dist:  19 cm/sec Right: 17 cm/sec Left: 26 cm/sec Hepatic  Vein Velocities Right:  37 cm/sec Middle:  32 cm/sec Left:  19 cm/sec IVC: Present and patent with normal respiratory phasicity. Hepatic Artery Velocity:  73 cm/sec Splenic Vein Velocity:  16 cm/sec (antegrade) Spleen: 6.3 cm x 5.0 cm x 12.8 cm with a total volume of 210 cm^3 (411 cm^3 is upper limit normal) Portal Vein Occlusion/Thrombus: No Splenic Vein Occlusion/Thrombus: No Ascites: Large volume Varices: None IMPRESSION: Morphologic changes of hepatic cirrhosis with secondary signs of portal hypertension including large volume ascites. Patent portal veins with antegrade flow throughout. No evidence of hepatoma. Ruthann Cancer, MD [facility] Electronically Signed   By: Ruthann Cancer M.D.   On: 11/06/2021 13:26    Assessment:  # Decompensated Cirrhosis 2/2 etoh abuse -MELD-NA 23 (with inr from 12/10) - sober for last 7 months per daughter - encephalopathy, coagulopathy, hypoalbuminemia, hyperbilirubinemia, hyponatremia, esophageal varices, hyperammonemia, thrombocytopenia   # Portal HTN with sequelae - encephalopathy, coagulopathy, hypoalbuminemia, hyperbilirubinemia, hyponatremia, esophageal varices, hyperammonemia, thrombocytopenia -Last underwent EGD on November 20 with grade 3 varices with stigmata of recent bleeding status post banding.  Also noted to have a duodenal erosion at that time.   # Acute encephalopathy 2/2 above- resolving -Per daughter and patient the patient had not been adequately taking his lactulose -No signs of infection, GI bleeding, or thrombosis   # CKD   # Lactic acidosis resolving   # chronic anemia- macrocytic   Plan:  Lactulose tid for goal of at least 3 soft BM per day Xifaxin 550mg  bid po Low sodium diet (<2g per day) and high protein diet- counseled on No signs of GIB Currently without signs of infection Avoid hepatotoxic and sedating agents Continue home aldactone 100mg  daily and Lasix 40 mg  daily Continue home protonix at this time Monitor cmp, cbc, inr daily No utility in checking daily ammonia as this does not correlate with severity of encephalopathy, recommend following clinically and response to lactulose and Xifaxan Awaiting paracentesis with studies; provide 25 g of albumin per 5 L removed; If gram stain +, culture +, or PMN > 250 please start antibiotics US Liver with doppler reviewed, no thrombosis  Has scheduled follow up with personal gastroenterologist this month per daughter Supportive care as per primary team  Clinically improving with administration of medication Discussed case with hospitalist.  GI to sign off. Available as needed. Please do not hesitate to call regarding questions or concerns.  I personally performed the service.  Management of other medical comorbidities as per primary team  Thank you for allowing [place] to participate in this patient's care.   Annamaria Helling, DO [facility]  Portions of the record may have been created with voice recognition software. Occasional wrong-word or 'sound-a-like' substitutions may have occurred due to the inherent limitations of voice recognition software.  Read the chart carefully and recognize, using context, where substitutions may have occurred.

## 2021-11-07 NOTE — Progress Notes (Signed)
PROGRESS NOTE    Shawn Torres  ZTI:[id number] DOB: August 02, 1966 DOA: 11/05/2021 PCP: Kurtis Bushman, MD  Chief Complaint  Patient presents with   Altered Mental Status   Fall   Emesis    Brief Narrative:  55 yo with hx etoh cirrhosis, T2DM, esophageal varices, thrombocytopenia, and etoh abuse who presented with hepatic encephalopathy.  Per GI discussion with daughter, he'd not been taking his lactulose as prescribed.    Assessment & Plan:   Principal Problem:   Hepatic encephalopathy Active Problems:   Alcoholic cirrhosis of liver with ascites (HCC)   AKI (acute kidney injury) (HCC)   Lactic acidosis   GERD (gastroesophageal reflux disease)   BPH (benign prostatic hyperplasia)   Type 2 diabetes mellitus, without long-term current use of insulin (HCC)   * Hepatic encephalopathy Appreciate GI assistance Apparently not taking meds appropriately per GI discussion with daughter Continue lactulose/rifaximin Continued asterixis on exam, mental status seems to be improving Plan for para, considered abx, though will hold off with thought that cause was medication nonadherence  Alcoholic cirrhosis of liver with ascites (HCC) Appreciate GI recs Lactulose, rifaximin Low sodium diet, lasix, spironolactone US liver doppler with patent portal veins with antegrade flow throughout, hepatic cirrhosis   AKI (acute kidney injury) (HCC) Improving, he's overloaded with ascites Continue diuretics  Lactic acidosis Likely related to poor clearance with cirrhosis, possibly intravascularly depleted - will not trend any more with overall improvement  GERD (gastroesophageal reflux disease) PPI  BPH (benign prostatic hyperplasia) flomax  Type 2 diabetes mellitus, without long-term current use of insulin (HCC) SSI   DVT prophylaxis: lovenox Code Status: full Family Communication: none at bedside Disposition:   Status is: Inpatient  Remains inpatient appropriate because: need for  paracentesis, improvement in mental status       Consultants:  GI  Procedures:  none  Antimicrobials:  Anti-infectives (From admission, onward)    Start     Dose/Rate Route Frequency Ordered Stop   11/06/21 1300  cefTRIAXone (ROCEPHIN) 2 g in sodium chloride 0.9 % 100 mL IVPB  Status:  Discontinued        2 g 200 mL/hr over 30 Minutes Intravenous Every 24 hours 11/06/21 1229 11/06/21 1340   11/06/21 0115  rifaximin (XIFAXAN) tablet 550 mg        550 mg Oral 2 times daily 11/06/21 0106         Subjective: Improved confusion today  Objective: Vitals:   11/06/21 2302 11/07/21 0459 11/07/21 0845 11/07/21 1147  BP: 127/74 126/77 125/73 126/73  Pulse: 85 82 80 69  Resp: 19 16 16 16   Temp: 98.9 F (37.2 C) 98.1 F (36.7 C) 98.1 F (36.7 C) 97.6 F (36.4 C)  TempSrc: Oral  Oral Oral  SpO2: 100% 100% 100% 100%  Weight: 77.8 kg     Height: 5\' 5"  (1.651 m)       Intake/Output Summary (Last 24 hours) at 11/07/2021 1215 Last data filed at 11/06/2021 2340 Gross per 24 hour  Intake 1250.22 ml  Output --  Net 1250.22 ml   Filed Weights   11/05/21 2313 11/06/21 2302  Weight: 77.8 kg 77.8 kg    Examination:  General: No acute distress. Cardiovascular: RRR Lungs: unlabored Abdomen: distended, no pain Neurological: Alert and oriented 3. Asterixis on exam Extremities: No clubbing or cyanosis. No edema.  Data Reviewed: I have personally reviewed following labs and imaging studies  CBC: Recent Labs  Lab 11/02/21 0235 11/03/21 0236 11/05/21 2309 11/06/21  1610 11/07/21 0430  WBC 5.7 5.6 5.6 6.4 5.5  NEUTROABS  --   --  4.0  --  2.7  HGB 7.3* 7.4* 9.6* 8.5* 8.8*  HCT 20.4* 20.2* 27.4* 23.7* 23.7*  MCV 100.5* 102.0* 100.7* 100.9* 103.9*  PLT 120* 123* 149* 139* 137*    Basic Metabolic Panel: Recent Labs  Lab 11/02/21 0235 11/03/21 0236 11/05/21 2309 11/06/21 0506 11/07/21 0430  NA 131* 133* 136 138 135  K 4.5 4.3 4.7 4.1 4.2  CL 106 106 109 111 111   CO2 20* 21* 18* 20* 17*  GLUCOSE 157* 141* 150* 119* 187*  BUN 35* 39* 38* 35* 34*  CREATININE 1.51* 1.24 1.53* 1.29* 1.22  CALCIUM 8.0* 8.1* 8.6* 8.4* 8.4*  MG  --   --   --   --  2.2  PHOS  --   --   --   --  5.0*    GFR: Estimated Creatinine Clearance: 67.3 mL/min (by C-G formula based on SCr of 1.22 mg/dL).  Liver Function Tests: Recent Labs  Lab 11/02/21 0235 11/03/21 0236 11/05/21 2309 11/06/21 0506 11/07/21 0430  AST 39 35 53* 48* 43*  ALT ALKPHOS 107 108 112 95 109  BILITOT 2.0* 2.1* 3.7* 3.8* 2.8*  PROT 5.8* 5.5* 7.1 6.6 6.3*  ALBUMIN 2.6* 2.9* 3.4* 2.9* 2.8*    CBG: Recent Labs  Lab 11/06/21 1212 11/06/21 1844 11/06/21 2148 11/06/21 2319 11/07/21 1148  GLUCAP 168* 176* 189* 120* 148*     Recent Results (from the past 240 hour(s))  Resp Panel by RT-PCR (Flu Latwan Luchsinger&B, Covid) Nasopharyngeal Swab     Status: None   Collection Time: 10/29/21  2:59 PM   Specimen: Nasopharyngeal Swab; Nasopharyngeal(NP) swabs in vial transport medium  Result Value Ref Range Status   SARS Coronavirus 2 by RT PCR NEGATIVE NEGATIVE Final    Comment: (NOTE) SARS-CoV-2 target nucleic acids are NOT DETECTED.  The SARS-CoV-2 RNA is generally detectable in upper respiratory specimens during the acute phase of infection. The lowest concentration of SARS-CoV-2 viral copies this assay can detect is 138 copies/mL. Massiah Longanecker negative result does not preclude SARS-Cov-2 infection and should not be used as the sole basis for treatment or other patient management decisions. Dream Nodal negative result may occur with  improper specimen collection/handling, submission of specimen other than nasopharyngeal swab, presence of viral mutation(s) within the areas targeted by this assay, and inadequate number of viral copies(<138 copies/mL). Latrish Mogel negative result must be combined with clinical observations, patient history, and epidemiological information. The expected result is Negative.  Fact Sheet  for Patients:  BloggerCourse.com  Fact Sheet for Healthcare Providers:  SeriousBroker.it  This test is no t yet approved or cleared by the [place] FDA and  has been authorized for detection and/or diagnosis of SARS-CoV-2 by FDA under an Emergency Use Authorization (EUA). This EUA will remain  in effect (meaning this test can be used) for the duration of the COVID-19 declaration under Section 564(b)(1) of the Act, 21 U.S.C.section 360bbb-3(b)(1), unless the authorization is terminated  or revoked sooner.       Influenza Berish Bohman by PCR NEGATIVE NEGATIVE Final   Influenza B by PCR NEGATIVE NEGATIVE Final    Comment: (NOTE) The Xpert Xpress SARS-CoV-2/FLU/RSV plus assay is intended as an aid in the diagnosis of influenza from Nasopharyngeal swab specimens and should not be used as Latishia Suitt sole basis for treatment. Nasal washings and aspirates are unacceptable for Xpert Xpress  SARS-CoV-2/FLU/RSV testing.  Fact Sheet for Patients: BloggerCourse.com  Fact Sheet for Healthcare Providers: SeriousBroker.it  This test is not yet approved or cleared by the [place] FDA and has been authorized for detection and/or diagnosis of SARS-CoV-2 by FDA under an Emergency Use Authorization (EUA). This EUA will remain in effect (meaning this test can be used) for the duration of the COVID-19 declaration under Section 564(b)(1) of the Act, 21 U.S.C. section 360bbb-3(b)(1), unless the authorization is terminated or revoked.  Performed at [facility], [address] [place], [place] [address]   Body fluid culture w Gram Stain     Status: None   Collection Time: 11/01/21  9:20 AM   Specimen: Peritoneal Washings  Result Value Ref Range Status   Specimen Description   Final    PERITONEAL Performed at [facility], [address] [place], [place] [address]    Special Requests    Final    NONE Performed at [facility], [address] [place], [place] [address]    Gram Stain   Final    WBC PRESENT, PREDOMINANTLY MONONUCLEAR NO ORGANISMS SEEN CYTOSPIN SMEAR    Culture   Final    NO GROWTH 3 DAYS Performed at [facility], [address], [place], [place] [address]    Report Status 11/04/2021 FINAL  Final  Resp Panel by RT-PCR (Flu Mitsugi Schrader&B, Covid) Nasopharyngeal Swab     Status: None   Collection Time: 11/05/21 11:43 PM   Specimen: Nasopharyngeal Swab; Nasopharyngeal(NP) swabs in vial transport medium  Result Value Ref Range Status   SARS Coronavirus 2 by RT PCR NEGATIVE NEGATIVE Final    Comment: (NOTE) SARS-CoV-2 target nucleic acids are NOT DETECTED.  The SARS-CoV-2 RNA is generally detectable in upper respiratory specimens during the acute phase of infection. The lowest concentration of SARS-CoV-2 viral copies this assay can detect is 138 copies/mL. Jarell Mcewen negative result does not preclude SARS-Cov-2 infection and should not be used as the sole basis for treatment or other patient management decisions. Manilla Strieter negative result may occur with  improper specimen collection/handling, submission of specimen other than nasopharyngeal swab, presence of viral mutation(s) within the areas targeted by this assay, and inadequate number of viral copies(<138 copies/mL). Paola Flynt negative result must be combined with clinical observations, patient history, and epidemiological information. The expected result is Negative.  Fact Sheet for Patients:  BloggerCourse.com  Fact Sheet for Healthcare Providers:  SeriousBroker.it  This test is no t yet approved or cleared by the [place] FDA and  has been authorized for detection and/or diagnosis of SARS-CoV-2 by FDA under an Emergency Use Authorization (EUA). This EUA will remain  in effect (meaning this test can be used) for the duration of the COVID-19 declaration  under Section 564(b)(1) of the Act, 21 U.S.C.section 360bbb-3(b)(1), unless the authorization is terminated  or revoked sooner.       Influenza Morning Halberg by PCR NEGATIVE NEGATIVE Final   Influenza B by PCR NEGATIVE NEGATIVE Final    Comment: (NOTE) The Xpert Xpress SARS-CoV-2/FLU/RSV plus assay is intended as an aid in the diagnosis of influenza from Nasopharyngeal swab specimens and should not be used as Annamarie Yamaguchi sole basis for treatment. Nasal washings and aspirates are unacceptable for Xpert Xpress SARS-CoV-2/FLU/RSV testing.  Fact Sheet for Patients: BloggerCourse.com  Fact Sheet for Healthcare Providers: SeriousBroker.it  This test is not yet approved or cleared by the [place] FDA and has been authorized for detection and/or diagnosis of SARS-CoV-2 by  FDA under an Emergency Use Authorization (EUA). This EUA will remain in effect (meaning this test can be used) for the duration of the COVID-19 declaration under Section 564(b)(1) of the Act, 21 U.S.C. section 360bbb-3(b)(1), unless the authorization is terminated or revoked.  Performed at [facility], [address] [place], [place] [address]          Radiology Studies: DG Chest 2 View  Result Date: 11/05/2021 CLINICAL DATA:  Cough EXAM: CHEST - 2 VIEW COMPARISON:  10/29/2021 FINDINGS: Cardiomegaly. Unchanged mediastinal contours. Low lung volumes. Unchanged elevation of the right hemidiaphragm. No focal pulmonary opacity. The visualized skeletal structures are unremarkable. IMPRESSION: 1. Cardiomegaly. 2. No acute cardiopulmonary process. Electronically Signed   By: Wiliam Ke M.D.   On: 11/05/2021 23:37   CT HEAD WO CONTRAST ( )  Result Date: 11/05/2021 CLINICAL DATA:  Altered mental status. EXAM: CT HEAD WITHOUT CONTRAST TECHNIQUE: Contiguous axial images were obtained from the base of the skull through the vertex without intravenous contrast. COMPARISON:   October 10, 2021 FINDINGS: Brain: No evidence of acute infarction, hemorrhage, hydrocephalus, extra-axial collection or mass lesion/mass effect. Vascular: No hyperdense vessel or unexpected calcification. Skull: Normal. Negative for fracture or focal lesion. Sinuses/Orbits: No acute finding. Other: None. IMPRESSION: No acute intracranial pathology. Electronically Signed   By: Aram Candela M.D.   On: 11/05/2021 23:52   US LIVER DOPPLER  Result Date: 11/06/2021 CLINICAL DATA:  55 year old male with history of alcoholic cirrhosis. EXAM: DUPLEX ULTRASOUND OF LIVER TECHNIQUE: Color and duplex Doppler ultrasound was performed to evaluate the hepatic in-flow and out-flow vessels. COMPARISON:  CT abdomen pelvis from 04/28/2021 FINDINGS: Liver: Diffusely increased parenchymal echogenicity. Nodular contour. No focal lesion, mass or intrahepatic biliary ductal dilatation. Main Portal Vein size: 1.3 cm Portal Vein Velocities (all antegrade) Main Prox:  29 cm/sec Main Mid: 22 cm/sec Main Dist:  19 cm/sec Right: 17 cm/sec Left: 26 cm/sec Hepatic Vein Velocities Right:  37 cm/sec Middle:  32 cm/sec Left:  19 cm/sec IVC: Present and patent with normal respiratory phasicity. Hepatic Artery Velocity:  73 cm/sec Splenic Vein Velocity:  16 cm/sec (antegrade) Spleen: 6.3 cm x 5.0 cm x 12.8 cm with Khaiden Segreto total volume of 210 cm^3 (411 cm^3 is upper limit normal) Portal Vein Occlusion/Thrombus: No Splenic Vein Occlusion/Thrombus: No Ascites: Large volume Varices: None IMPRESSION: Morphologic changes of hepatic cirrhosis with secondary signs of portal hypertension including large volume ascites. Patent portal veins with antegrade flow throughout. No evidence of hepatoma. Marliss Coots, MD [facility] Electronically Signed   By: Marliss Coots M.D.   On: 11/06/2021 13:26        Scheduled Meds:  (feeding supplement) PROSource Plus  30 mL Oral BID BM   enoxaparin (LOVENOX)  injection  40 mg Subcutaneous Q24H   feeding supplement  237 mL Oral TID BM   folic acid  1 mg Oral Daily   furosemide  40 mg Oral Daily   insulin aspart  0-9 Units Subcutaneous TID AC & HS   lactulose  30 g Oral TID   multivitamin with minerals  1 tablet Oral Daily   pantoprazole  40 mg Oral Daily   rifaximin  550 mg Oral BID   spironolactone  100 mg Oral Daily   tamsulosin  0.4 mg Oral Daily   thiamine  100 mg Oral Daily   Continuous Infusions:  albumin human       LOS: 1 day    Time spent:  over 30 min    Lacretia Nicks, MD [facility]   To contact the attending provider between 7A-7P or the covering provider during after hours 7P-7A, please log into the web site [email or web address] and access using universal [place] password for that web site. If you do not have the password, please call the hospital operator.  11/07/2021, 12:15 PM

## 2021-11-08 ENCOUNTER — Inpatient Hospital Stay: Payer: Medicaid Other

## 2021-11-08 LAB — BASIC METABOLIC PANEL
Anion gap: 5 (ref 5–15)
BUN: 34 mg/dL — ABNORMAL HIGH (ref 6–20)
CO2: 20 mmol/L — ABNORMAL LOW (ref 22–32)
Calcium: 8.1 mg/dL — ABNORMAL LOW (ref 8.9–10.3)
Chloride: 110 mmol/L (ref 98–111)
Creatinine, Ser: 1.3 mg/dL — ABNORMAL HIGH (ref 0.61–1.24)
GFR, Estimated: >60 mL/min (ref >60)
Glucose, Bld: 154 mg/dL — ABNORMAL HIGH (ref 70–99)
Potassium: 4 mmol/L (ref 3.5–5.1)
Sodium: 135 mmol/L (ref 135–145)

## 2021-11-08 LAB — HEPATIC FUNCTION PANEL
ALT: 25 U/L (ref 0–44)
AST: 34 U/L (ref 15–41)
Albumin: 2.6 g/dL — ABNORMAL LOW (ref 3.5–5.0)
Alkaline Phosphatase: 111 U/L (ref 38–126)
Bilirubin, Direct: 0.6 mg/dL — ABNORMAL HIGH (ref 0.0–0.2)
Indirect Bilirubin: 2.2 mg/dL — ABNORMAL HIGH (ref 0.3–0.9)
Total Bilirubin: 2.8 mg/dL — ABNORMAL HIGH (ref 0.3–1.2)
Total Protein: 5.6 g/dL — ABNORMAL LOW (ref 6.5–8.1)

## 2021-11-08 LAB — BODY FLUID CELL COUNT WITH DIFFERENTIAL
Eos, Fluid: 0 %
Lymphs, Fluid: 41 %
Monocyte-Macrophage-Serous Fluid: 43 % — ABNORMAL LOW (ref 50–90)
Neutrophil Count, Fluid: 16 % (ref 0–25)
Total Nucleated Cell Count, Fluid: 147 cu mm (ref 0–1000)

## 2021-11-08 LAB — PROTEIN, PLEURAL OR PERITONEAL FLUID: Total protein, fluid: <3 g/dL

## 2021-11-08 LAB — CBC WITH DIFFERENTIAL/PLATELET
Abs Immature Granulocytes: 0.01 10*3/uL (ref 0.00–0.07)
Basophils Absolute: 0.1 10*3/uL (ref 0.0–0.1)
Basophils Relative: 1 %
Eosinophils Absolute: 0.3 10*3/uL (ref 0.0–0.5)
Eosinophils Relative: 5 %
HCT: 21.9 % — ABNORMAL LOW (ref 39.0–52.0)
Hemoglobin: 7.6 g/dL — ABNORMAL LOW (ref 13.0–17.0)
Immature Granulocytes: 0 %
Lymphocytes Relative: 31 %
Lymphs Abs: 1.7 10*3/uL (ref 0.7–4.0)
MCH: 34.2 pg — ABNORMAL HIGH (ref 26.0–34.0)
MCHC: 34.7 g/dL (ref 30.0–36.0)
MCV: 98.6 fL (ref 80.0–100.0)
Monocytes Absolute: 0.6 10*3/uL (ref 0.1–1.0)
Monocytes Relative: 11 %
Neutro Abs: 2.8 10*3/uL (ref 1.7–7.7)
Neutrophils Relative %: 52 %
Platelets: 125 10*3/uL — ABNORMAL LOW (ref 150–400)
RBC: 2.22 MIL/uL — ABNORMAL LOW (ref 4.22–5.81)
RDW: 14.2 % (ref 11.5–15.5)
WBC: 5.5 10*3/uL (ref 4.0–10.5)
nRBC: 0 % (ref 0.0–0.2)

## 2021-11-08 LAB — GLUCOSE, CAPILLARY
Glucose-Capillary: 124 mg/dL — ABNORMAL HIGH (ref 70–99)
Glucose-Capillary: 156 mg/dL — ABNORMAL HIGH (ref 70–99)
Glucose-Capillary: 146 mg/dL — ABNORMAL HIGH (ref 70–99)
Glucose-Capillary: 119 mg/dL — ABNORMAL HIGH (ref 70–99)

## 2021-11-08 LAB — PROTIME-INR
INR: 1.8 — ABNORMAL HIGH (ref 0.8–1.2)
Prothrombin Time: 21.1 seconds — ABNORMAL HIGH (ref 11.4–15.2)

## 2021-11-08 LAB — ALBUMIN, PLEURAL OR PERITONEAL FLUID: Albumin, Fluid: <1.5 g/dL

## 2021-11-08 MED ORDER — ALBUMIN HUMAN 25 % IV SOLN
50.0000 g | Freq: Once | INTRAVENOUS | Status: AC
  Administered 2021-11-08: 16:00:00 50 g via INTRAVENOUS
  Filled 2021-11-08: qty 200
  Filled 2021-11-08: qty 100

## 2021-11-08 NOTE — Procedures (Signed)
PROCEDURE SUMMARY:  Successful US guided paracentesis from RLQ.  Yielded 5.5 L of clear yellow fluid.  No immediate complications.  Pt tolerated well.   Specimen was sent for labs.  EBL None.  Pattricia Boss D PA-C 11/08/2021 12:37 PM

## 2021-11-08 NOTE — Progress Notes (Signed)
PROGRESS NOTE    Shawn Torres  VFI:[id number] DOB: 04-18-1966 DOA: 11/05/2021 PCP: Kurtis Bushman, MD  Chief Complaint  Patient presents with   Altered Mental Status   Fall   Emesis    Brief Narrative:  55 yo with hx etoh cirrhosis, T2DM, esophageal varices, thrombocytopenia, and etoh abuse who presented with hepatic encephalopathy.  Per GI discussion with daughter, he'd not been taking his lactulose as prescribed.    Assessment & Plan:   Principal Problem:   Hepatic encephalopathy Active Problems:   Alcoholic cirrhosis of liver with ascites (HCC)   AKI (acute kidney injury) (HCC)   Lactic acidosis   GERD (gastroesophageal reflux disease)   BPH (benign prostatic hyperplasia)   Type 2 diabetes mellitus, without long-term current use of insulin (HCC)   * Hepatic encephalopathy Appreciate GI assistance Apparently not taking meds appropriately per GI discussion with daughter Continue lactulose/rifaximin Asterixis improving, mental status improving No SBP on ascites fluid  Alcoholic cirrhosis of liver with ascites (HCC) Appreciate GI recs Lactulose, rifaximin Low sodium diet, lasix, spironolactone Needs beta blocker, discussed with GI, will defer to his outpatient GI US liver doppler with patent portal veins with antegrade flow throughout, hepatic cirrhosis S/p 5.5 L out today with para - give albumin  AKI (acute kidney injury) (HCC) Improving, he's overloaded with ascites Continue diuretics  Lactic acidosis Likely related to poor clearance with cirrhosis, possibly intravascularly depleted - will not trend any more with overall improvement  GERD (gastroesophageal reflux disease) PPI  BPH (benign prostatic hyperplasia) flomax  Type 2 diabetes mellitus, without long-term current use of insulin (HCC) SSI   DVT prophylaxis: lovenox Code Status: full Family Communication: none at bedside Disposition:   Status is: Inpatient  Remains inpatient appropriate  because: need for paracentesis, improvement in mental status       Consultants:  GI  Procedures:  none  Antimicrobials:  Anti-infectives (From admission, onward)    Start     Dose/Rate Route Frequency Ordered Stop   11/06/21 1300  cefTRIAXone (ROCEPHIN) 2 g in sodium chloride 0.9 % 100 mL IVPB  Status:  Discontinued        2 g 200 mL/hr over 30 Minutes Intravenous Every 24 hours 11/06/21 1229 11/06/21 1340   11/06/21 0115  rifaximin (XIFAXAN) tablet 550 mg        550 mg Oral 2 times daily 11/06/21 0106         Subjective: No complaints  Objective: Vitals:   11/08/21 0735 11/08/21 1053 11/08/21 1117 11/08/21 1233  BP: 118/74 (!) 105/43 118/68 131/76  Pulse: 77  74 88  Resp: 18  18 18   Temp: 98.5 F (36.9 C)   98.5 F (36.9 C)  TempSrc:      SpO2: 100% 100% 100% 100%  Weight:      Height:        Intake/Output Summary (Last 24 hours) at 11/08/2021 1839 Last data filed at 11/08/2021 1609 Gross per 24 hour  Intake 720 ml  Output --  Net 720 ml   Filed Weights   11/05/21 2313 11/06/21 2302  Weight: 77.8 kg 77.8 kg    Examination:  General: No acute distress. Cardiovascular: RRR. Lungs: unlabored Abdomen: Soft, nontender, distend with ascites fluid Neurological: asterixis mild, improving mental status Skin: Warm and dry. No rashes or lesions. Extremities: No clubbing or cyanosis. No edema.   Data Reviewed: I have personally reviewed following labs and imaging studies  CBC: Recent Labs  Lab 11/03/21 0236  11/05/21 2309 11/06/21 0506 11/07/21 0430 11/08/21 0402  WBC 5.6 5.6 6.4 5.5 5.5  NEUTROABS  --  4.0  --  2.7 2.8  HGB 7.4* 9.6* 8.5* 8.8* 7.6*  HCT 20.2* 27.4* 23.7* 23.7* 21.9*  MCV 102.0* 100.7* 100.9* 103.9* 98.6  PLT 123* 149* 139* 137* 125*    Basic Metabolic Panel: Recent Labs  Lab 11/03/21 0236 11/05/21 2309 11/06/21 0506 11/07/21 0430 11/08/21 0402  NA 133* 136 138 135 135  K 4.3 4.7 4.1 4.2 4.0  CL 106 109 111 111 110   CO2 21* 18* 20* 17* 20*  GLUCOSE 141* 150* 119* 187* 154*  BUN 39* 38* 35* 34* 34*  CREATININE 1.24 1.53* 1.29* 1.22 1.30*  CALCIUM 8.1* 8.6* 8.4* 8.4* 8.1*  MG  --   --   --  2.2  --   PHOS  --   --   --  5.0*  --     GFR: Estimated Creatinine Clearance: 63.2 mL/min ([place]aw [place]) (by C-G formula based on SCr of 1.3 mg/dL (H)).  Liver Function Tests: Recent Labs  Lab 11/03/21 0236 11/05/21 2309 11/06/21 0506 11/07/21 0430 11/08/21 0402  AST 35 53* 48* 43* 34  ALT 21 31 27 27 25   ALKPHOS 108 112 95 109 111  BILITOT 2.1* 3.7* 3.8* 2.8* 2.8*  PROT 5.5* 7.1 6.6 6.3* 5.6*  ALBUMIN 2.9* 3.4* 2.9* 2.8* 2.6*    CBG: Recent Labs  Lab 11/07/21 1632 11/07/21 2105 11/08/21 0737 11/08/21 1235 11/08/21 1746  GLUCAP 194* 165* 124* 156* 146*     Recent Results (from the past 240 hour(s))  Body fluid culture w Gram Stain     Status: None   Collection Time: 11/01/21  9:20 AM   Specimen: Peritoneal Washings  Result Value Ref Range Status   Specimen Description   Final    PERITONEAL Performed at [facility], [address] [place], [place]    Special Requests   Final    NONE Performed at [facility], [address] [place], [place]    Gram Stain   Final    WBC PRESENT, PREDOMINANTLY MONONUCLEAR NO ORGANISMS SEEN CYTOSPIN SMEAR    Culture   Final    NO GROWTH 3 DAYS Performed at [facility], [address], [place], [place]    Report Status 11/04/2021 FINAL  Final  Resp Panel by RT-PCR (Flu Vishruth Seoane&B, Covid) Nasopharyngeal Swab     Status: None   Collection Time: 11/05/21 11:43 PM   Specimen: Nasopharyngeal Swab; Nasopharyngeal(NP) swabs in vial transport medium  Result Value Ref Range Status   SARS Coronavirus 2 by RT PCR NEGATIVE NEGATIVE Final    Comment: (NOTE) SARS-CoV-2 target nucleic acids are NOT DETECTED.  The SARS-CoV-2 RNA is generally detectable in upper respiratory specimens during the acute phase of  infection. The lowest concentration of SARS-CoV-2 viral copies this assay can detect is 138 copies/mL. Zaira Iacovelli negative result does not preclude SARS-Cov-2 infection and should not be used as the sole basis for treatment or other patient management decisions. Modest Draeger negative result may occur with  improper specimen collection/handling, submission of specimen other than nasopharyngeal swab, presence of viral mutation(s) within the areas targeted by this assay, and inadequate number of viral copies(<138 copies/mL). Dayona Shaheen negative result must be combined with clinical observations, patient history, and epidemiological information. The expected result is Negative.  Fact Sheet for Patients:  11/07/21  Fact Sheet for Healthcare Providers:  BloggerCourse.com  This test is no t yet approved or cleared by the [place] and  has been authorized for detection and/or diagnosis of SARS-CoV-2 by FDA under an Emergency Use Authorization (EUA). This EUA will remain  in effect (meaning this test can be used) for the duration of the COVID-19 declaration under Section 564(b)(1) of the Act, 21 U.S.C.section 360bbb-3(b)(1), unless the authorization is terminated  or revoked sooner.       Influenza Vincente Asbridge by PCR NEGATIVE NEGATIVE Final   Influenza B by PCR NEGATIVE NEGATIVE Final    Comment: (NOTE) The Xpert Xpress SARS-CoV-2/FLU/RSV plus assay is intended as an aid in the diagnosis of influenza from Nasopharyngeal swab specimens and should not be used as Kay Shippy sole basis for treatment. Nasal washings and aspirates are unacceptable for Xpert Xpress SARS-CoV-2/FLU/RSV testing.  Fact Sheet for Patients: BloggerCourse.com  Fact Sheet for Healthcare Providers: SeriousBroker.it  This test is not yet approved or cleared by the [place] FDA and has been authorized for detection and/or diagnosis of SARS-CoV-2  by FDA under an Emergency Use Authorization (EUA). This EUA will remain in effect (meaning this test can be used) for the duration of the COVID-19 declaration under Section 564(b)(1) of the Act, 21 U.S.C. section 360bbb-3(b)(1), unless the authorization is terminated or revoked.  Performed at [facility], [address] [place], [place] [address]   Body fluid culture w Gram Stain     Status: None (Preliminary result)   Collection Time: 11/08/21 10:54 AM   Specimen: PATH Cytology Peritoneal fluid  Result Value Ref Range Status   Specimen Description   Final    PERITONEAL Performed at [facility], [address], [place], [place] [address]    Special Requests   Final    NONE Performed at [facility], [address] [place], [place] [address]    Gram Stain   Final    WBC PRESENT,BOTH PMN AND MONONUCLEAR NO ORGANISMS SEEN CYTOSPIN SMEAR Performed at [facility], [address] [place], [place] [address]    Culture PENDING  Incomplete   Report Status PENDING  Incomplete         Radiology Studies: US Paracentesis  Result Date: 11/08/2021 INDICATION: Recurrent ascites request received for paracentesis. EXAM: ULTRASOUND GUIDED  PARACENTESIS MEDICATIONS: Local 1% lidocaine only. COMPLICATIONS: None immediate. PROCEDURE: Informed written consent was obtained from the patient after Kristen Bushway discussion of the risks, benefits and alternatives to treatment. Abdou Stocks timeout was performed prior to the initiation of the procedure. Initial ultrasound scanning demonstrates Ludene Stokke large amount of ascites within the right lower abdominal quadrant. The right lower abdomen was prepped and draped in the usual sterile fashion. 1% lidocaine was used for local anesthesia. Following this, Demari Gales 19 gauge, 7-cm, Yueh catheter was introduced. An ultrasound image was saved for documentation purposes. The paracentesis was performed. The catheter was removed and Azure Barrales dressing was applied. The  patient tolerated the procedure well without immediate post procedural complication. Patient received post-procedure intravenous albumin; see nursing notes for details. FINDINGS: Romelle Muldoon total of approximately 5.5 L of clear yellow fluid was removed. Samples were sent to the laboratory as requested by the clinical team. IMPRESSION: Successful ultrasound-guided paracentesis yielding 5.5 liters of peritoneal fluid. This exam was performed by Pattricia Boss PA-C, and was supervised and interpreted by Dr. Juliette Alcide. Electronically Signed   By: Olive Bass M.D.   On: 11/08/2021 14:23        Scheduled Meds:  (feeding supplement) PROSource Plus  30 mL Oral BID BM   feeding supplement  237 mL Oral TID BM   folic acid  1 mg Oral Daily   furosemide  40 mg Oral Daily   insulin aspart  0-5 Units Subcutaneous QHS   insulin aspart  0-9 Units Subcutaneous TID WC   lactulose  30 g Oral TID   multivitamin with minerals  1 tablet Oral Daily   pantoprazole  40 mg Oral Daily   rifaximin  550 mg Oral BID   spironolactone  100 mg Oral Daily   tamsulosin  0.4 mg Oral Daily   thiamine  100 mg Oral Daily   Continuous Infusions:     LOS: 2 days    Time spent: over 30 min    Lacretia Nicks, MD [facility]   To contact the attending provider between 7A-7P or the covering provider during after hours 7P-7A, please log into the web site [email or web address] and access using universal [place] password for that web site. If you do not have the password, please call the hospital operator.  11/08/2021, 6:39 PM

## 2021-11-09 LAB — CBC WITH DIFFERENTIAL/PLATELET
Abs Immature Granulocytes: 0.02 10*3/uL (ref 0.00–0.07)
Basophils Absolute: 0.1 10*3/uL (ref 0.0–0.1)
Basophils Relative: 1 %
Eosinophils Absolute: 0.2 10*3/uL (ref 0.0–0.5)
Eosinophils Relative: 2 %
HCT: 24 % — ABNORMAL LOW (ref 39.0–52.0)
Hemoglobin: 8.3 g/dL — ABNORMAL LOW (ref 13.0–17.0)
Immature Granulocytes: 0 %
Lymphocytes Relative: 15 %
Lymphs Abs: 1.4 10*3/uL (ref 0.7–4.0)
MCH: 34.2 pg — ABNORMAL HIGH (ref 26.0–34.0)
MCHC: 34.6 g/dL (ref 30.0–36.0)
MCV: 98.8 fL (ref 80.0–100.0)
Monocytes Absolute: 0.9 10*3/uL (ref 0.1–1.0)
Monocytes Relative: 10 %
Neutro Abs: 6.5 10*3/uL (ref 1.7–7.7)
Neutrophils Relative %: 72 %
Platelets: 118 10*3/uL — ABNORMAL LOW (ref 150–400)
RBC: 2.43 MIL/uL — ABNORMAL LOW (ref 4.22–5.81)
RDW: 13.7 % (ref 11.5–15.5)
Smear Review: NORMAL
WBC: 9.1 10*3/uL (ref 4.0–10.5)
nRBC: 0 % (ref 0.0–0.2)

## 2021-11-09 LAB — BASIC METABOLIC PANEL
Anion gap: 6 (ref 5–15)
BUN: 33 mg/dL — ABNORMAL HIGH (ref 6–20)
CO2: 18 mmol/L — ABNORMAL LOW (ref 22–32)
Calcium: 8.4 mg/dL — ABNORMAL LOW (ref 8.9–10.3)
Chloride: 108 mmol/L (ref 98–111)
Creatinine, Ser: 1.17 mg/dL (ref 0.61–1.24)
GFR, Estimated: >60 mL/min (ref >60)
Glucose, Bld: 163 mg/dL — ABNORMAL HIGH (ref 70–99)
Potassium: 4.2 mmol/L (ref 3.5–5.1)
Sodium: 132 mmol/L — ABNORMAL LOW (ref 135–145)

## 2021-11-09 LAB — GLUCOSE, CAPILLARY
Glucose-Capillary: 130 mg/dL — ABNORMAL HIGH (ref 70–99)
Glucose-Capillary: 160 mg/dL — ABNORMAL HIGH (ref 70–99)

## 2021-11-09 LAB — HEPATIC FUNCTION PANEL
ALT: 24 U/L (ref 0–44)
AST: 33 U/L (ref 15–41)
Albumin: 3 g/dL — ABNORMAL LOW (ref 3.5–5.0)
Alkaline Phosphatase: 100 U/L (ref 38–126)
Bilirubin, Direct: 0.6 mg/dL — ABNORMAL HIGH (ref 0.0–0.2)
Indirect Bilirubin: 2.1 mg/dL — ABNORMAL HIGH (ref 0.3–0.9)
Total Bilirubin: 2.7 mg/dL — ABNORMAL HIGH (ref 0.3–1.2)
Total Protein: 6.1 g/dL — ABNORMAL LOW (ref 6.5–8.1)

## 2021-11-09 LAB — PROTEIN, BODY FLUID (OTHER): Total Protein, Body Fluid Other: 1 g/dL

## 2021-11-09 LAB — PROTIME-INR
INR: 1.9 — ABNORMAL HIGH (ref 0.8–1.2)
Prothrombin Time: 21.6 seconds — ABNORMAL HIGH (ref 11.4–15.2)

## 2021-11-09 LAB — PATHOLOGIST SMEAR REVIEW

## 2021-11-09 MED ORDER — LACTULOSE 10 GM/15ML PO SOLN: 30.0000 g | Freq: Three times a day (TID) | ORAL | 1 refills | Status: DC

## 2021-11-09 NOTE — Progress Notes (Signed)
Shawn Torres to be D/C'd Home per MD order.  Discussed prescriptions and follow up appointments with the patient. Prescriptions were sent to pt's pharmacy. medication list explained in detail. Pt verbalized understanding.  Allergies as of 11/09/2021   No Known Allergies      Medication List     TAKE these medications    (feeding supplement) PROSource Plus liquid Take 30 mLs by mouth 2 (two) times daily between meals.   feeding supplement Liqd Take 237 mLs by mouth 3 (three) times daily between meals.   folic acid 1 MG tablet Commonly known as: FOLVITE Take 1 tablet by mouth daily.   furosemide 40 MG tablet Commonly known as: LASIX Take 1 tablet (40 mg total) by mouth daily.   lactulose 10 GM/15ML solution Commonly known as: CHRONULAC Take 45 mLs (30 g total) by mouth 3 (three) times daily. Goal of 3 soft bowel movements daily. What changed:  how much to take when to take this additional instructions   multivitamin with minerals Tabs tablet Take 1 tablet by mouth daily.   ondansetron 4 MG disintegrating tablet Commonly known as: ZOFRAN-ODT Take 1 tablet (4 mg total) by mouth every 8 (eight) hours as needed for nausea or vomiting.   pantoprazole 40 MG tablet Commonly known as: PROTONIX Take 1 tablet (40 mg total) by mouth daily.   rifaximin 550 MG Tabs tablet Commonly known as: XIFAXAN Take 1 tablet (550 mg total) by mouth 2 (two) times daily.   spironolactone 100 MG tablet Commonly known as: ALDACTONE Take 1 tablet (100 mg total) by mouth daily.   tamsulosin 0.4 MG Caps capsule Commonly known as: FLOMAX Take 1 capsule (0.4 mg total) by mouth daily.   thiamine 100 MG tablet Take 1 tablet (100 mg total) by mouth daily.        Vitals:   11/09/21 0840 11/09/21 1151  BP: 121/61 112/70  Pulse: 79 75  Resp: 18 17  Temp: 98.9 F (37.2 C) 98.5 F (36.9 C)  SpO2: 100% 100%    Skin clean, dry and intact without evidence of skin break down, no evidence of  skin tears noted. IV catheter discontinued intact. Site without signs and symptoms of complications. Dressing and pressure applied. Pt denies pain at this time. No complaints noted.  An After Visit Summary was printed and given to the patient. Patient escorted via WC, and D/C home via private auto.  Rigoberto Noel

## 2021-11-09 NOTE — Evaluation (Signed)
Physical Therapy Evaluation Patient Details Name: Shawn Torres MRN: [id number] DOB: 09-11-1966 Today's Date: 11/09/2021  History of Present Illness  55 y.o. male with hx etoh cirrhosis, T2DM, esophageal varices, thrombocytopenia, and etoh abuse who presented with hepatic encephalopathy. Pt discussion with GI revealed pt had not been taking his Lactulose as prescribed.  Clinical Impression  Pt received supine in bed, agreeable to therapy. Daughter in room and offers to translate. During discussion, daughter reports a significant history of falling - 6 falls over the last 6 months. She states all falls have been related to AMS/confusion. Pt does not remember and denies these falls. He was able to perform all mobility independently within today's session. Pt ambulated 278ft + 460ft using no AD. First bout performed with shoes untied and loose (how he typically wears them); pt ambulated awkwardly with heavy steps due to shoes not being secure. Pt sat down to tie shoes and was able to ambulate with increased efficiency and safety. PT educated pt he should begin to tie his shoes. Pt asked why his muscles feel stiff - PT educated on effects prolonged immobility. Pt has no further PT needs. Please re-consult if pt status changes.      Recommendations for follow up therapy are one component of a multi-disciplinary discharge planning process, led by the attending physician.  Recommendations may be updated based on patient status, additional functional criteria and insurance authorization.  Follow Up Recommendations No PT follow up    Assistance Recommended at Discharge PRN  Functional Status Assessment Patient has not had a recent decline in their functional status  Equipment Recommendations  None recommended by PT    Recommendations for Other Services       Precautions / Restrictions Precautions Precautions: None Restrictions Weight Bearing Restrictions: No      Mobility  Bed Mobility Overal bed  mobility: Independent                  Transfers Overall transfer level: Independent                 General transfer comment: STS x2 reps from EOB.    Ambulation/Gait Ambulation/Gait assistance: Independent Gait Distance (Feet): 400 Feet Assistive device: None   Gait velocity: WFL     General Gait Details: 291ft + 427ft - first performed with shoes untied and loose (how he typically wears them); Pt ambulated awkwardly with heavy steps due to shoes not being secure. Pt sat down to tie shoes and was able to ambulate with increased efficiency and safety.  Stairs            Wheelchair Mobility    Modified Rankin (Stroke Patients Only)       Balance Overall balance assessment: No apparent balance deficits (not formally assessed)                                           Pertinent Vitals/Pain Pain Assessment: No/denies pain    Home Living Family/patient expects to be discharged to:: Private residence Living Arrangements: Spouse/significant other Available Help at Discharge: Available 24 hours/day;Family;Friend(s) Type of Home: Mobile home Home Access: Stairs to enter Entrance Stairs-Rails: Can reach both Entrance Stairs-Number of Steps: 3   Home Layout: One level Home Equipment: Agricultural consultant (2 wheels) Additional Comments: Has RW from last admission, does not use it.    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 Daughter reports 6 falls in the last 6 months (pt does not remember falls). All falls related to preiods of "confusion". ADLs Comments: Assists with cooking/cleaning.     Hand Dominance        Extremity/Trunk Assessment   Upper Extremity Assessment Upper Extremity Assessment: Overall WFL for tasks assessed    Lower Extremity  Assessment Lower Extremity Assessment: Overall WFL for tasks assessed       Communication   Communication: Prefers language other than English ([place] - daughter interpreted)  Cognition Arousal/Alertness: Awake/alert Behavior During Therapy: WFL for tasks assessed/performed Overall Cognitive Status: Within Functional Limits for tasks assessed                                 General Comments: A&Ox4        General Comments      Exercises     Assessment/Plan    PT Assessment Patient does not need any further PT services  PT Problem List         PT Treatment Interventions      PT Goals (Current goals can be found in the Care Plan section)  Acute Rehab PT Goals Patient Stated Goal: to go home PT Goal Formulation: With patient Time For Goal Achievement: 10/30/21 Potential to Achieve Goals: Good    Frequency     Barriers to discharge        Co-evaluation               AM-PAC PT "6 Clicks" Mobility  Outcome Measure Help needed turning from your back to your side while in a flat bed without using bedrails?: None Help needed moving from lying on your back to sitting on the side of a flat bed without using bedrails?: None Help needed moving to and from a bed to a chair (including a wheelchair)?: None Help needed standing up from a chair using your arms (e.g., wheelchair or bedside chair)?: None Help needed to walk in hospital room?: None Help needed climbing 3-5 steps with a railing? : None 6 Click Score: 24    End of Session   Activity Tolerance: Patient tolerated treatment well Patient left: in bed;with call bell/phone within reach;with family/visitor present (sitting EOB) Nurse Communication: Mobility status;Other (comment) (daughter asking to speak with RN) PT Visit Diagnosis: History of falling (Z91.81)    Time: 1224-8250 PT Time Calculation (min) (ACUTE ONLY): 28 min   Charges:   PT Evaluation $PT Eval Moderate Complexity: 1 Mod           Dawnielle Christiana PT, DPT 11/09/21 12:20 PM [phone]

## 2021-11-09 NOTE — TOC Initial Note (Signed)
Transition of Care [facility]) - Initial/Assessment Note    Patient Details  Name: Shawn Torres MRN: [id number] Date of Birth: 26-Dec-1965  Transition of Care [facility]) CM/SW Contact:    Caryn Section, RN Phone Number: 11/09/2021, 11:38 AM  Clinical Narrative:     Patient states that he lives at home with domestic partner.  He states she can assist if needed.  Daughter, son in law transports patient to appointments.  Patient has no concerns about obtaining/taking medications, states his family transports him to the pharmacy.  [facility] as he states he has no insurance (consult to financial services made via email).  Patient denies further needs at this time.  TOC contact information given              Expected Discharge Plan: Home/Self Care Barriers to Discharge: Barriers Resolved   Patient Goals and CMS Choice     Choice offered to / list presented to : NA  Expected Discharge Plan and Services Expected Discharge Plan: Home/Self Care In-house Referral: Artist, Interpreting Services Discharge Planning Services: CM Consult Post Acute Care Choice: NA Living arrangements for the past 2 months: Mobile Home Expected Discharge Date: 11/09/21                                    Prior Living Arrangements/Services Living arrangements for the past 2 months: Mobile Home Lives with:: Self, Domestic Partner Patient language and need for interpreter reviewed:: Yes Research scientist (life sciences) used during this assessment) Do you feel safe going back to the place where you live?: Yes      Need for Family Participation in Patient Care: Yes (Comment) Care giver support system in place?: Yes (comment)   Criminal Activity/Legal Involvement Pertinent to Current Situation/Hospitalization: No - Comment as needed  Activities of Daily Living Home Assistive Devices/Equipment: None ADL Screening (condition at time of admission) Patient's cognitive  ability adequate to safely complete daily activities?: Yes Is the patient deaf or have difficulty hearing?: No Does the patient have difficulty seeing, even when wearing glasses/contacts?: No Does the patient have difficulty concentrating, remembering, or making decisions?: No Patient able to express need for assistance with ADLs?: Yes Does the patient have difficulty dressing or bathing?: No Independently performs ADLs?: Yes (appropriate for developmental age) Does the patient have difficulty walking or climbing stairs?: No Weakness of Legs: None Weakness of Arms/Hands: None  Permission Sought/Granted Permission sought to share information with : Case Manager, Other (comment) Research scientist (physical sciences)) Permission granted to share information with : Yes, Verbal Permission Granted              Emotional Assessment Appearance:: Appears stated age Attitude/Demeanor/Rapport: Engaged Affect (typically observed): Pleasant, Appropriate Orientation: : Oriented to Self, Oriented to Place, Oriented to  Time, Oriented to Situation Alcohol / Substance Use: Not Applicable Psych Involvement: No (comment)  Admission diagnosis:  Hepatic encephalopathy [K76.82] Cirrhosis (HCC) [K74.60] Altered mental status, unspecified altered mental status type [R41.82] Patient Active Problem List   Diagnosis Date Noted   Lactic acidosis 11/06/2021   GERD (gastroesophageal reflux disease) 11/06/2021   BPH (benign prostatic hyperplasia) 11/06/2021   AKI (acute kidney injury) (HCC) 11/03/2021   Macrocytic anemia 11/03/2021   Alcoholic cirrhosis of liver with ascites (HCC) 10/29/2021   Alcohol use disorder, severe, in early remission (HCC)    Malnutrition of moderate degree 10/15/2021   Abdominal distention  Acute respiratory failure with hypoxia (HCC)    Secondary esophageal varices with bleeding (HCC)    Hematemesis 10/10/2021   Hepatic encephalopathy    Secondary esophageal varices without bleeding (HCC)     Thrombocytopenia (HCC) 04/30/2021   Type 2 diabetes mellitus, without long-term current use of insulin (HCC) 04/30/2021   Hyponatremia 05/24/2019   Pneumonia due to COVID-19 virus 05/24/2019   Transaminitis 05/24/2019   Alcohol dependence with uncomplicated withdrawal (HCC) 05/24/2019   Hypokalemia 05/24/2019   COVID-19 05/24/2019   PCP:  Kurtis Bushman, MD Pharmacy:   [facility] [id number] - [place], [place] - [address] AT [facility] OF [address] [place] [address] [place] [address] Phone: [phone] Fax: [phone]     Social Determinants of Health (SDOH) Interventions    Readmission Risk Interventions Readmission Risk Prevention Plan 10/12/2021  Transportation Screening Complete  PCP or Specialist Appt within 3-5 Days Complete  HRI or Home Care Consult Not Complete  HRI or Home Care Consult comments unsure of discharge needs  Social Work Consult for Recovery Care Planning/Counseling Complete  Palliative Care Screening Not Applicable  Medication Review Oceanographer) Complete

## 2021-11-09 NOTE — Discharge Summary (Signed)
Physician Discharge Summary  Shawn Torres UVO:[id number] DOB: [birth year] DOA: 11/05/2021  PCP: Kurtis Bushman, MD  Admit date: 11/05/2021 Discharge date: 11/09/2021  Time spent: 40 minutes  Recommendations for Outpatient Follow-up:  Follow outpatient cbc/cmp  Follow final body fluid culture  Continue lactulose/rifaximin as prescribe - daughter planning to help Continue lasix/spironolactone  Discharge Diagnoses:  Principal Problem:   Hepatic encephalopathy Active Problems:   Alcoholic cirrhosis of liver with ascites (HCC)   AKI (acute kidney injury) (HCC)   Lactic acidosis   GERD (gastroesophageal reflux disease)   BPH (benign prostatic hyperplasia)   Type 2 diabetes mellitus, without long-term current use of insulin (HCC)   Discharge Condition: stable  Diet recommendation: low sodium  Filed Weights   11/05/21 2313 11/06/21 2302  Weight: 77.8 kg 77.8 kg    History of present illness:  55 yo with hx etoh cirrhosis, T2DM, esophageal varices, thrombocytopenia, and etoh abuse who presented with hepatic encephalopathy.  Per GI discussion with daughter, he'd not been taking his lactulose as prescribed.  He's improved with administration of lactulose and rifaximin here.  He's s/p paracentesis, no evidence SBP.  He was appropriate for discharge on 12/20 and discharged home with plan for daughter to assist with meds.  Hospital Course:  * Hepatic encephalopathy Appreciate GI assistance Apparently not taking meds appropriately per GI discussion with daughter Continue lactulose/rifaximin Asterixis improved, mental status improved No SBP on ascites fluid  Alcoholic cirrhosis of liver with ascites (HCC) Appreciate GI recs Lactulose, rifaximin Low sodium diet, lasix, spironolactone -> adjust diuretics outpatient prn for volume management Needs beta blocker, discussed with GI, will defer to his outpatient GI US liver doppler with patent portal veins with antegrade flow  throughout, hepatic cirrhosis S/p 5.5 L with para on 12/19, s/p albumin Follow final body fluid culture   AKI (acute kidney injury) (HCC) Improving, he's overloaded with ascites Continue diuretics  Lactic acidosis Likely related to poor clearance with cirrhosis, possibly intravascularly depleted - will not trend any more with overall improvement  GERD (gastroesophageal reflux disease) PPI  BPH (benign prostatic hyperplasia) flomax  Type 2 diabetes mellitus, without long-term current use of insulin (HCC) A1c 4.4     Procedures: IR para 12/19   Consultations: GI IR  Discharge Exam: Vitals:   11/09/21 0840 11/09/21 1151  BP: 121/61 112/70  Pulse: 79 75  Resp: 18 17  Temp: 98.9 F (37.2 C) 98.5 F (36.9 C)  SpO2: 100% 100%   No complaints - feels well, eager to d/c Daughter at bedside  General: No acute distress. Cardiovascular: RRR Lungs: unlabored Abdomen: Soft, nontender, mild distension Neurological: Alert and oriented 3. Moves all extremities 4 with equal strength. Cranial nerves II through XII grossly intact.  Asterixis improved Skin: Warm and dry. No rashes or lesions. Extremities: No clubbing or cyanosis. No edema.   Discharge Instructions   Discharge Instructions     Call MD for:  difficulty breathing, headache or visual disturbances   Complete by: As directed    Call MD for:  extreme fatigue   Complete by: As directed    Call MD for:  hives   Complete by: As directed    Call MD for:  persistant dizziness or light-headedness   Complete by: As directed    Call MD for:  persistant nausea and vomiting   Complete by: As directed    Call MD for:  redness, tenderness, or signs of infection (pain, swelling, redness, odor or green/yellow discharge around incision  site)   Complete by: As directed    Call MD for:  severe uncontrolled pain   Complete by: As directed    Call MD for:  temperature >100.4   Complete by: As directed    Diet - low sodium  heart healthy   Complete by: As directed    Discharge instructions   Complete by: As directed    You were seen for hepatic encephalopathy (confusion related to liver disease).  This was related to medication issues.  It's important to take the lactulose and rifaximin as scheduled.  You should titrate the lactulose to at least 3 soft bowel movements daily.   Continue your lasix and spironolactone.  The goal of these meds is to help with the ascites (fluid).  Your doctor may adjust the dose outpatient.  Please follow up with gastroenterology outpatient.  They may start you on additional medicines outpatient.   Return for new, recurrent, or worsening symptoms.  Please ask your PCP to request records from this hospitalization so they know what was done and what the next steps will be.   Increase activity slowly   Complete by: As directed       Allergies as of 11/09/2021   No Known Allergies      Medication List     TAKE these medications    (feeding supplement) PROSource Plus liquid Take 30 mLs by mouth 2 (two) times daily between meals.   feeding supplement Liqd Take 237 mLs by mouth 3 (three) times daily between meals.   folic acid 1 MG tablet Commonly known as: FOLVITE Take 1 tablet by mouth daily.   furosemide 40 MG tablet Commonly known as: LASIX Take 1 tablet (40 mg total) by mouth daily.   lactulose 10 GM/15ML solution Commonly known as: CHRONULAC Take 45 mLs (30 g total) by mouth 3 (three) times daily. Goal of 3 soft bowel movements daily. What changed:  how much to take when to take this additional instructions   multivitamin with minerals Tabs tablet Take 1 tablet by mouth daily.   ondansetron 4 MG disintegrating tablet Commonly known as: ZOFRAN-ODT Take 1 tablet (4 mg total) by mouth every 8 (eight) hours as needed for nausea or vomiting.   pantoprazole 40 MG tablet Commonly known as: PROTONIX Take 1 tablet (40 mg total) by mouth daily.    rifaximin 550 MG Tabs tablet Commonly known as: XIFAXAN Take 1 tablet (550 mg total) by mouth 2 (two) times daily.   spironolactone 100 MG tablet Commonly known as: ALDACTONE Take 1 tablet (100 mg total) by mouth daily.   tamsulosin 0.4 MG Caps capsule Commonly known as: FLOMAX Take 1 capsule (0.4 mg total) by mouth daily.   thiamine 100 MG tablet Take 1 tablet (100 mg total) by mouth daily.       No Known Allergies    The results of significant diagnostics from this hospitalization (including imaging, microbiology, ancillary and laboratory) are listed below for reference.    Significant Diagnostic Studies: DG Chest 2 View  Result Date: 11/05/2021 CLINICAL DATA:  Cough EXAM: CHEST - 2 VIEW COMPARISON:  10/29/2021 FINDINGS: Cardiomegaly. Unchanged mediastinal contours. Low lung volumes. Unchanged elevation of the right hemidiaphragm. No focal pulmonary opacity. The visualized skeletal structures are unremarkable. IMPRESSION: 1. Cardiomegaly. 2. No acute cardiopulmonary process. Electronically Signed   By: Wiliam Ke M.D.   On: 11/05/2021 23:37   CT HEAD WO CONTRAST ( )  Result Date: 11/05/2021 CLINICAL DATA:  Altered mental status.  EXAM: CT HEAD WITHOUT CONTRAST TECHNIQUE: Contiguous axial images were obtained from the base of the skull through the vertex without intravenous contrast. COMPARISON:  October 10, 2021 FINDINGS: Brain: No evidence of acute infarction, hemorrhage, hydrocephalus, extra-axial collection or mass lesion/mass effect. Vascular: No hyperdense vessel or unexpected calcification. Skull: Normal. Negative for fracture or focal lesion. Sinuses/Orbits: No acute finding. Other: None. IMPRESSION: No acute intracranial pathology. Electronically Signed   By: Aram Candela M.D.   On: 11/05/2021 23:52   CT HEAD WO CONTRAST ( )  Result Date: 10/10/2021 CLINICAL DATA:  Cerebral edema Hyperammonemia. EXAM: CT HEAD WITHOUT CONTRAST TECHNIQUE: Contiguous axial  images were obtained from the base of the skull through the vertex without intravenous contrast. COMPARISON:  None. FINDINGS: Brain: There is no mass, hemorrhage or extra-axial collection. The size and configuration of the ventricles and extra-axial CSF spaces are normal. The brain parenchyma is normal, without acute or chronic infarction. Vascular: No abnormal hyperdensity of the major intracranial arteries or dural venous sinuses. No intracranial atherosclerosis. Skull: The visualized skull base, calvarium and extracranial soft tissues are normal. Sinuses/Orbits: No fluid levels or advanced mucosal thickening of the visualized paranasal sinuses. No mastoid or middle ear effusion. The orbits are normal. IMPRESSION: Normal head CT. Electronically Signed   By: Deatra Robinson M.D.   On: 10/10/2021 18:54   US Paracentesis  Result Date: 11/08/2021 INDICATION: Recurrent ascites request received for paracentesis. EXAM: ULTRASOUND GUIDED  PARACENTESIS MEDICATIONS: Local 1% lidocaine only. COMPLICATIONS: None immediate. PROCEDURE: Informed written consent was obtained from the patient after Veer Elamin discussion of the risks, benefits and alternatives to treatment. Quince Santana timeout was performed prior to the initiation of the procedure. Initial ultrasound scanning demonstrates Miasha Emmons large amount of ascites within the right lower abdominal quadrant. The right lower abdomen was prepped and draped in the usual sterile fashion. 1% lidocaine was used for local anesthesia. Following this, Malynda Smolinski 19 gauge, 7-cm, Yueh catheter was introduced. An ultrasound image was saved for documentation purposes. The paracentesis was performed. The catheter was removed and Shardae Kleinman dressing was applied. The patient tolerated the procedure well without immediate post procedural complication. Patient received post-procedure intravenous albumin; see nursing notes for details. FINDINGS: Vernis Cabacungan total of approximately 5.5 L of clear yellow fluid was removed. Samples were sent to the  laboratory as requested by the clinical team. IMPRESSION: Successful ultrasound-guided paracentesis yielding 5.5 liters of peritoneal fluid. This exam was performed by Pattricia Boss PA-C, and was supervised and interpreted by Dr. Juliette Alcide. Electronically Signed   By: Olive Bass M.D.   On: 11/08/2021 14:23   US Paracentesis  Result Date: 11/01/2021 INDICATION: Recurrent ascites request received for paracentesis. EXAM: ULTRASOUND GUIDED  PARACENTESIS MEDICATIONS: Local 1% lidocaine only. COMPLICATIONS: None immediate. PROCEDURE: Informed written consent was obtained from the patient after Dandria Griego discussion of the risks, benefits and alternatives to treatment. Juddson Cobern timeout was performed prior to the initiation of the procedure. Initial ultrasound scanning demonstrates Hakeem Frazzini large amount of ascites within the left lower abdominal quadrant. The left lower abdomen was prepped and draped in the usual sterile fashion. 1% lidocaine was used for local anesthesia. Following this, Joleene Burnham 19 gauge, 7-cm, Yueh catheter was introduced. An ultrasound image was saved for documentation purposes. The paracentesis was performed. The catheter was removed and Merwin Breden dressing was applied. The patient tolerated the procedure well without immediate post procedural complication. FINDINGS: Yoshino Broccoli total of approximately 8 L of clear yellow fluid was removed. Samples were sent to the laboratory as requested by the  clinical team. IMPRESSION: Successful ultrasound-guided paracentesis yielding 8 liters of peritoneal fluid. Read By: Pattricia Boss PA-C Electronically Signed   By: Irish Lack M.D.   On: 11/01/2021 10:48   US Paracentesis  Result Date: 10/13/2021 INDICATION: Ascites request received for diagnostic and therapeutic paracentesis, patient on vasopressor support. EXAM: ULTRASOUND GUIDED  PARACENTESIS MEDICATIONS: Local 1% lidocaine only. COMPLICATIONS: None immediate. PROCEDURE: Informed written consent was obtained from the patient after Murriel Holwerda  discussion of the risks, benefits and alternatives to treatment. Nakeem Murnane timeout was performed prior to the initiation of the procedure. Initial ultrasound scanning demonstrates Tyronica Truxillo large amount of ascites within the right lower abdominal quadrant. The right lower abdomen was prepped and draped in the usual sterile fashion. 1% lidocaine was used for local anesthesia. Following this, Dajsha Massaro 19 gauge, 7-cm, Yueh catheter was introduced. An ultrasound image was saved for documentation purposes. The paracentesis was performed. The catheter was removed and Shermika Balthaser dressing was applied. The patient tolerated the procedure well without immediate post procedural complication. Samwise Eckardt total of 5 L was removed there is additional ascites that remains however patient is requiring vasopressor support at this time so decision was made to only remove 5 Liters. FINDINGS: Jaber Dunlow total of approximately 5 L of clear yellow fluid was removed. Samples were sent to the laboratory as requested by the clinical team. IMPRESSION: Successful ultrasound-guided paracentesis yielding 5 liters of peritoneal fluid. Read By: Pattricia Boss PA-C Electronically Signed   By: Olive Bass M.D.   On: 10/13/2021 13:57   DG Chest Port 1 View  Result Date: 10/29/2021 CLINICAL DATA:  Altered mental status EXAM: PORTABLE CHEST 1 VIEW COMPARISON:  10/13/2021 FINDINGS: Cardiomegaly. Unchanged elevation of the right hemidiaphragm. No acute airspace opacity. The visualized skeletal structures are unremarkable. IMPRESSION: 1.  Cardiomegaly. 2. No acute abnormality of the lungs. Unchanged elevation of the right hemidiaphragm. Electronically Signed   By: Jearld Lesch M.D.   On: 10/29/2021 13:03   DG Chest Port 1 View  Result Date: 10/13/2021 CLINICAL DATA:  Acute respiratory failure with hypoxia (HCC) J96.01 (ICD-10-CM) EXAM: PORTABLE CHEST 1 VIEW COMPARISON:  October 10, 2021. FINDINGS: Endotracheal tube tip at the level of the clavicular heads, unchanged. Left IJ central venous  catheter with the tip projecting at the superior cavoatrial junction, unchanged. Low lung volumes with similar streaky bilateral lung opacities. No visible pleural effusions or pneumothorax on this semi erect radiograph. IMPRESSION: 1. Stable support devices. 2. Low lung volumes with similar streaky bilateral opacities, most likely atelectasis. Electronically Signed   By: Feliberto Harts M.D.   On: 10/13/2021 07:58   DG Chest Port 1 View  Result Date: 10/10/2021 CLINICAL DATA:  Check central line placement EXAM: PORTABLE CHEST 1 VIEW COMPARISON:  Film from earlier in the same day. FINDINGS: New left jugular central line is noted with catheter tip at the cavoatrial junction. No pneumothorax is noted. Cardiac shadow is stable. Endotracheal tube is. Lungs demonstrate some patchy atelectatic changes bilaterally. No bony abnormality is noted. IMPRESSION: No pneumothorax following central line placement. Electronically Signed   By: Alcide Clever M.D.   On: 10/10/2021 23:56   DG Chest Port 1 View  Result Date: 10/10/2021 CLINICAL DATA:  Intubation EXAM: PORTABLE CHEST 1 VIEW COMPARISON:  Chest radiograph 04/12/2021 FINDINGS: The endotracheal tube tip is approximately 3.2 cm from the carina. The cardiomediastinal silhouette is stable. Lung volumes are low, with unchanged slight asymmetric elevation of the right hemidiaphragm. Patchy opacities in the left base may reflect infection or  aspiration. There is no other focal consolidation. Linear opacities in the right midlung may reflect atelectasis or scar. There is no pulmonary edema. There is no pleural effusion or pneumothorax. There is no acute osseous abnormality. IMPRESSION: 1. Endotracheal tube in satisfactory position. 2. Patchy opacities in the left base could reflect aspiration or infection. Electronically Signed   By: Lesia Hausen M.D.   On: 10/10/2021 18:54   US LIVER DOPPLER  Result Date: 11/06/2021 CLINICAL DATA:  55 year old male with history of  alcoholic cirrhosis. EXAM: DUPLEX ULTRASOUND OF LIVER TECHNIQUE: Color and duplex Doppler ultrasound was performed to evaluate the hepatic in-flow and out-flow vessels. COMPARISON:  CT abdomen pelvis from 04/28/2021 FINDINGS: Liver: Diffusely increased parenchymal echogenicity. Nodular contour. No focal lesion, mass or intrahepatic biliary ductal dilatation. Main Portal Vein size: 1.3 cm Portal Vein Velocities (all antegrade) Main Prox:  29 cm/sec Main Mid: 22 cm/sec Main Dist:  19 cm/sec Right: 17 cm/sec Left: 26 cm/sec Hepatic Vein Velocities Right:  37 cm/sec Middle:  32 cm/sec Left:  19 cm/sec IVC: Present and patent with normal respiratory phasicity. Hepatic Artery Velocity:  73 cm/sec Splenic Vein Velocity:  16 cm/sec (antegrade) Spleen: 6.3 cm x 5.0 cm x 12.8 cm with Zaelyn Barbary total volume of 210 cm^3 (411 cm^3 is upper limit normal) Portal Vein Occlusion/Thrombus: No Splenic Vein Occlusion/Thrombus: No Ascites: Large volume Varices: None IMPRESSION: Morphologic changes of hepatic cirrhosis with secondary signs of portal hypertension including large volume ascites. Patent portal veins with antegrade flow throughout. No evidence of hepatoma. Marliss Coots, MD [facility] Electronically Signed   By: Marliss Coots M.D.   On: 11/06/2021 13:26    Microbiology: Recent Results (from the past 240 hour(s))  Body fluid culture w Gram Stain     Status: None   Collection Time: 11/01/21  9:20 AM   Specimen: Peritoneal Washings  Result Value Ref Range Status   Specimen Description   Final    PERITONEAL Performed at [facility], [address] [place], [place] [address]    Special Requests   Final    NONE Performed at [facility], [address] [place], [place] [address]    Gram Stain   Final    WBC PRESENT, PREDOMINANTLY MONONUCLEAR NO ORGANISMS SEEN CYTOSPIN SMEAR    Culture   Final    NO GROWTH 3 DAYS Performed at [facility], [address] [place], [place] [address]    Report Status 11/04/2021 FINAL  Final  Resp Panel by RT-PCR (Flu Mariam[place]it Depaoli&B, Covid) Nasopharyngeal Swab     Status: None   Collection Time: 11/05/21 11:43 PM   Specimen: Nasopharyngeal Swab; Nasopharyngeal(NP) swabs in vial transport medium  Result Value Ref Range Status   SARS Coronavirus 2 by RT PCR NEGATIVE NEGATIVE Final    Comment: (NOTE) SARS-CoV-2 target nucleic acids are NOT DETECTED.  The SARS-CoV-2 RNA is generally detectable in upper respiratory specimens during the acute phase of infection. The lowest concentration of SARS-CoV-2 viral copies this assay can detect is 138 copies/mL. Peachie Barkalow negative result does not preclude SARS-Cov-2 infection and should not be used as the sole basis for treatment or other patient management decisions. Franck Vinal negative result may occur with  improper specimen collection/handling, submission of specimen other than nasopharyngeal swab, presence of viral mutation(s) within the areas targeted by this assay, and inadequate number of viral copies(<138 copies/mL). Thu Baggett negative result must be combined with clinical observations, patient history, and epidemiological  information. The expected result is Negative.  Fact Sheet for Patients:  BloggerCourse.com  Fact Sheet for Healthcare Providers:  SeriousBroker.it  This test is no t yet approved or cleared by the [place] FDA and  has been authorized for detection and/or diagnosis of SARS-CoV-2 by FDA under an Emergency Use Authorization (EUA). This EUA will remain  in effect (meaning this test can be used) for the duration of the COVID-19 declaration under Section 564(b)(1) of the Act, 21 U.S.C.section 360bbb-3(b)(1), unless the authorization is terminated  or revoked sooner.       Influenza Howard Patton by PCR NEGATIVE NEGATIVE Final   Influenza B by PCR NEGATIVE NEGATIVE Final    Comment: (NOTE) The Xpert Xpress  SARS-CoV-2/FLU/RSV plus assay is intended as an aid in the diagnosis of influenza from Nasopharyngeal swab specimens and should not be used as Zela Sobieski sole basis for treatment. Nasal washings and aspirates are unacceptable for Xpert Xpress SARS-CoV-2/FLU/RSV testing.  Fact Sheet for Patients: BloggerCourse.com  Fact Sheet for Healthcare Providers: SeriousBroker.it  This test is not yet approved or cleared by the [place] FDA and has been authorized for detection and/or diagnosis of SARS-CoV-2 by FDA under an Emergency Use Authorization (EUA). This EUA will remain in effect (meaning this test can be used) for the duration of the COVID-19 declaration under Section 564(b)(1) of the Act, 21 U.S.C. section 360bbb-3(b)(1), unless the authorization is terminated or revoked.  Performed at [facility], [address] [place], [place] [address]   Body fluid culture w Gram Stain     Status: None (Preliminary result)   Collection Time: 11/08/21 10:54 AM   Specimen: PATH Cytology Peritoneal fluid  Result Value Ref Range Status   Specimen Description   Final    PERITONEAL Performed at [facility], [address], [place], [place] [address]    Special Requests   Final    NONE Performed at [facility], [address] [place], [place] [address]    Gram Stain   Final    WBC PRESENT,BOTH PMN AND MONONUCLEAR NO ORGANISMS SEEN CYTOSPIN SMEAR    Culture   Final    NO GROWTH < 24 HOURS Performed at [facility], [address] [place], [place] [address]    Report Status PENDING  Incomplete     Labs: Basic Metabolic Panel: Recent Labs  Lab 11/05/21 2309 11/06/21 0506 11/07/21 0430 11/08/21 0402 11/09/21 0447  NA 136 138 135 135 132*  K 4.7 4.1 4.2 4.0 4.2  CL 109 111 111 110 108  CO2 18* 20* 17* 20* 18*  GLUCOSE 150* 119* 187* 154* 163*  BUN 38* 35* 34* 34* 33*  CREATININE 1.53* 1.29* 1.22  1.30* 1.17  CALCIUM 8.6* 8.4* 8.4* 8.1* 8.4*  MG  --   --  2.2  --   --   PHOS  --   --  5.0*  --   --    Liver Function Tests: Recent Labs  Lab 11/05/21 2309 11/06/21 0506 11/07/21 0430 11/08/21 0402 11/09/21 0447  AST 53* 48* 43* 34 33  ALT 31 27 27 25 24   ALKPHOS 112 95 109 111 100  BILITOT 3.7* 3.8* 2.8* 2.8* 2.7*  PROT 7.1 6.6 6.3* 5.6* 6.1*  ALBUMIN 3.4* 2.9* 2.8* 2.6* 3.0*   No results for input(s): LIPASE, AMYLASE in the last 168 hours. Recent Labs  Lab 11/05/21 2309 11/06/21 0506  AMMONIA 184* 103*   CBC: Recent Labs  Lab 11/05/21  2309 11/06/21 0506 11/07/21 0430 11/08/21 0402 11/09/21 0447  WBC 5.6 6.4 5.5 5.5 7.4  NEUTROABS 4.0  --  2.7 2.8 5.1  HGB 9.6* 8.5* 8.8* 7.6* 8.0*  HCT 27.4* 23.7* 23.7* 21.9* 16.2*  MCV 100.7* 100.9* 103.9* 98.6 107.3*  PLT 149* 139* 137* 125* 125*   Cardiac Enzymes: No results for input(s): CKTOTAL, CKMB, CKMBINDEX, TROPONINI in the last 168 hours. BNP: BNP (last 3 results) Recent Labs    04/12/21 1206  BNP 80.8    ProBNP (last 3 results) No results for input(s): PROBNP in the last 8760 hours.  CBG: Recent Labs  Lab 11/08/21 1235 11/08/21 1746 11/08/21 2050 11/09/21 0829 11/09/21 1153  GLUCAP 156* 146* 119* 130* 160*       Signed:  Lacretia Nicks MD.  [facility] 11/09/2021, 2:15 PM

## 2021-11-11 LAB — BODY FLUID CULTURE W GRAM STAIN: Culture: NO GROWTH

## 2021-11-12 ENCOUNTER — Inpatient Hospital Stay: Payer: Medicaid Other

## 2021-11-12 ENCOUNTER — Other Ambulatory Visit: Payer: Self-pay

## 2021-11-12 ENCOUNTER — Encounter: Payer: Self-pay | Admitting: Emergency Medicine

## 2021-11-12 ENCOUNTER — Inpatient Hospital Stay
Admission: EM | Admit: 2021-11-12 | Discharge: 2021-11-13 | DRG: 434 | Disposition: A | Discharge status: Home / Self Care | Payer: Medicaid Other | Attending: Internal Medicine | Admitting: Internal Medicine

## 2021-11-12 DIAGNOSIS — K219 Gastro-esophageal reflux disease without esophagitis: Secondary | ICD-10-CM | POA: Diagnosis present

## 2021-11-12 DIAGNOSIS — R410 Disorientation, unspecified: Secondary | ICD-10-CM

## 2021-11-12 DIAGNOSIS — K7031 Alcoholic cirrhosis of liver with ascites: Secondary | ICD-10-CM | POA: Diagnosis not present

## 2021-11-12 DIAGNOSIS — E119 Type 2 diabetes mellitus without complications: Secondary | ICD-10-CM | POA: Diagnosis present

## 2021-11-12 DIAGNOSIS — R4182 Altered mental status, unspecified: Secondary | ICD-10-CM | POA: Diagnosis not present

## 2021-11-12 DIAGNOSIS — N400 Enlarged prostate without lower urinary tract symptoms: Secondary | ICD-10-CM | POA: Diagnosis present

## 2021-11-12 DIAGNOSIS — R0602 Shortness of breath: Secondary | ICD-10-CM

## 2021-11-12 DIAGNOSIS — Z20822 Contact with and (suspected) exposure to covid-19: Secondary | ICD-10-CM | POA: Diagnosis present

## 2021-11-12 DIAGNOSIS — K7682 Hepatic encephalopathy: Secondary | ICD-10-CM | POA: Diagnosis present

## 2021-11-12 LAB — TYPE AND SCREEN
ABO/RH(D): O POS
Antibody Screen: NEGATIVE

## 2021-11-12 LAB — CBC
HCT: 28 % — ABNORMAL LOW (ref 39.0–52.0)
Hemoglobin: 9.8 g/dL — ABNORMAL LOW (ref 13.0–17.0)
MCH: 36 pg — ABNORMAL HIGH (ref 26.0–34.0)
MCHC: 35 g/dL (ref 30.0–36.0)
MCV: 102.9 fL — ABNORMAL HIGH (ref 80.0–100.0)
Platelets: 170 10*3/uL (ref 150–400)
RBC: 2.72 MIL/uL — ABNORMAL LOW (ref 4.22–5.81)
RDW: 14.1 % (ref 11.5–15.5)
WBC: 7.5 10*3/uL (ref 4.0–10.5)
nRBC: 0 % (ref 0.0–0.2)

## 2021-11-12 LAB — COMPREHENSIVE METABOLIC PANEL
ALT: 34 U/L (ref 0–44)
AST: 50 U/L — ABNORMAL HIGH (ref 15–41)
Albumin: 3.3 g/dL — ABNORMAL LOW (ref 3.5–5.0)
Alkaline Phosphatase: 149 U/L — ABNORMAL HIGH (ref 38–126)
Anion gap: 9 (ref 5–15)
BUN: 35 mg/dL — ABNORMAL HIGH (ref 6–20)
CO2: 17 mmol/L — ABNORMAL LOW (ref 22–32)
Calcium: 8.8 mg/dL — ABNORMAL LOW (ref 8.9–10.3)
Chloride: 108 mmol/L (ref 98–111)
Creatinine, Ser: 1.34 mg/dL — ABNORMAL HIGH (ref 0.61–1.24)
GFR, Estimated: >60 mL/min (ref >60)
Glucose, Bld: 190 mg/dL — ABNORMAL HIGH (ref 70–99)
Potassium: 5.2 mmol/L — ABNORMAL HIGH (ref 3.5–5.1)
Sodium: 134 mmol/L — ABNORMAL LOW (ref 135–145)
Total Bilirubin: 2.6 mg/dL — ABNORMAL HIGH (ref 0.3–1.2)
Total Protein: 7.3 g/dL (ref 6.5–8.1)

## 2021-11-12 LAB — BLOOD GAS, ARTERIAL
Acid-base deficit: 6.3 mmol/L — ABNORMAL HIGH (ref 0.0–2.0)
Bicarbonate: 15.6 mmol/L — ABNORMAL LOW (ref 20.0–28.0)
FIO2: 0.21
O2 Saturation: 98.7 %
Patient temperature: 37
pCO2 arterial: 20 mmHg — ABNORMAL LOW (ref 32.0–48.0)
pH, Arterial: 7.5 — ABNORMAL HIGH (ref 7.350–7.450)
pO2, Arterial: 110 mmHg — ABNORMAL HIGH (ref 83.0–108.0)

## 2021-11-12 LAB — PROTIME-INR
INR: 1.8 — ABNORMAL HIGH (ref 0.8–1.2)
Prothrombin Time: 20.6 seconds — ABNORMAL HIGH (ref 11.4–15.2)

## 2021-11-12 LAB — RESP PANEL BY RT-PCR (FLU A&B, COVID) ARPGX2
Influenza A by PCR: NEGATIVE
Influenza B by PCR: NEGATIVE
SARS Coronavirus 2 by RT PCR: NEGATIVE

## 2021-11-12 LAB — HEMOGLOBIN AND HEMATOCRIT, BLOOD
HCT: 22.7 % — ABNORMAL LOW (ref 39.0–52.0)
HCT: 23.4 % — ABNORMAL LOW (ref 39.0–52.0)
Hemoglobin: 8.1 g/dL — ABNORMAL LOW (ref 13.0–17.0)
Hemoglobin: 8.6 g/dL — ABNORMAL LOW (ref 13.0–17.0)

## 2021-11-12 LAB — AMMONIA: Ammonia: 284 umol/L — ABNORMAL HIGH (ref 9–35)

## 2021-11-12 MED ORDER — ADULT MULTIVITAMIN W/MINERALS CH
1.0000 | ORAL_TABLET | Freq: Every day | ORAL | Status: DC
  Administered 2021-11-13: 10:00:00 1 via ORAL
  Filled 2021-11-12: qty 1

## 2021-11-12 MED ORDER — LACTULOSE ENEMA
300.0000 mL | Freq: Once | ORAL | Status: DC
  Filled 2021-11-12 (×2): qty 300

## 2021-11-12 MED ORDER — PANTOPRAZOLE SODIUM 40 MG PO TBEC
40.0000 mg | DELAYED_RELEASE_TABLET | Freq: Every day | ORAL | Status: DC
  Administered 2021-11-13: 10:00:00 40 mg via ORAL
  Filled 2021-11-12: qty 1

## 2021-11-12 MED ORDER — SODIUM CHLORIDE 0.9 % IV SOLN
2.0000 g | INTRAVENOUS | Status: DC
  Administered 2021-11-12: 12:00:00 2 g via INTRAVENOUS
  Filled 2021-11-12 (×2): qty 20

## 2021-11-12 MED ORDER — FOLIC ACID 1 MG PO TABS
1.0000 mg | ORAL_TABLET | Freq: Every day | ORAL | Status: DC
  Administered 2021-11-13: 10:00:00 1 mg via ORAL
  Filled 2021-11-12: qty 1

## 2021-11-12 MED ORDER — LORAZEPAM 2 MG/ML IJ SOLN
2.0000 mg | Freq: Once | INTRAMUSCULAR | Status: AC
  Administered 2021-11-12: 09:00:00 2 mg via INTRAMUSCULAR
  Filled 2021-11-12: qty 1

## 2021-11-12 MED ORDER — RIFAXIMIN 550 MG PO TABS
550.0000 mg | ORAL_TABLET | Freq: Two times a day (BID) | ORAL | Status: DC
  Administered 2021-11-13: 10:00:00 550 mg via ORAL
  Filled 2021-11-12 (×3): qty 1

## 2021-11-12 MED ORDER — ENSURE ENLIVE PO LIQD
237.0000 mL | Freq: Three times a day (TID) | ORAL | Status: DC
  Administered 2021-11-13: 10:00:00 237 mL via ORAL

## 2021-11-12 MED ORDER — ONDANSETRON HCL 4 MG PO TABS: 4.0000 mg | ORAL_TABLET | Freq: Four times a day (QID) | ORAL | Status: DC | PRN

## 2021-11-12 MED ORDER — SODIUM CHLORIDE 0.9 % IV SOLN: INTRAVENOUS | Status: AC

## 2021-11-12 MED ORDER — ONDANSETRON HCL 4 MG/2ML IJ SOLN: 4.0000 mg | Freq: Four times a day (QID) | INTRAMUSCULAR | Status: DC | PRN

## 2021-11-12 MED ORDER — SPIRONOLACTONE 25 MG PO TABS
100.0000 mg | ORAL_TABLET | Freq: Every day | ORAL | Status: DC
  Administered 2021-11-13: 10:00:00 100 mg via ORAL
  Filled 2021-11-12: qty 4

## 2021-11-12 MED ORDER — HALOPERIDOL LACTATE 5 MG/ML IJ SOLN
5.0000 mg | Freq: Once | INTRAMUSCULAR | Status: AC
  Administered 2021-11-12: 09:00:00 5 mg via INTRAMUSCULAR
  Filled 2021-11-12: qty 1

## 2021-11-12 MED ORDER — LACTATED RINGERS IV BOLUS
1000.0000 mL | Freq: Once | INTRAVENOUS | Status: AC
  Administered 2021-11-12: 10:00:00 1000 mL via INTRAVENOUS

## 2021-11-12 MED ORDER — LACTULOSE 10 GM/15ML PO SOLN
30.0000 g | Freq: Three times a day (TID) | ORAL | Status: DC
Start: 2021-11-12 — End: 2021-11-13
  Administered 2021-11-12 – 2021-11-13 (×2): 30 g via ORAL
  Filled 2021-11-12 (×3): qty 60

## 2021-11-12 MED ORDER — THIAMINE HCL 100 MG PO TABS
100.0000 mg | ORAL_TABLET | Freq: Every day | ORAL | Status: DC
  Administered 2021-11-13: 10:00:00 100 mg via ORAL
  Filled 2021-11-12: qty 1

## 2021-11-12 MED ORDER — FUROSEMIDE 40 MG PO TABS
40.0000 mg | ORAL_TABLET | Freq: Every day | ORAL | Status: DC
  Administered 2021-11-13: 10:00:00 40 mg via ORAL
  Filled 2021-11-12: qty 1

## 2021-11-12 NOTE — ED Notes (Signed)
RN to bedside. Pt is more alert at this time. Able to take his PO lactulose.

## 2021-11-12 NOTE — ED Provider Notes (Signed)
[facility] Emergency Department Provider Note ____________________________________________   Event Date/Time   First MD Initiated Contact with Patient 11/12/21 [phone]     (approximate)  I have reviewed the triage vital signs and the nursing notes.  HISTORY  Chief Complaint Altered Mental Status   HPI Shawn Torres is a 55 y.o. malewho presents to the ED for evaluation of altered mentation.   Chart review indicates alcoholic cirrhotic .  Patient was just admitted last week and went home 3 days ago.  Hepatic encephalopathy.  Patient's daughter and girlfriend bring him to the ED for evaluation of worsening confusion and repetitiveness.  Patient is repeatedly lifting up his right hand and asking everyone to look tremulous he is.  He inconsistently follows commands and does not answer any questions appropriately.  History is limited by his confusion.  Past Medical History:  Diagnosis Date   Cirrhosis, alcoholic (HCC)     Patient Active Problem List   Diagnosis Date Noted   Lactic acidosis 11/06/2021   GERD (gastroesophageal reflux disease) 11/06/2021   BPH (benign prostatic hyperplasia) 11/06/2021   AKI (acute kidney injury) (HCC) 11/03/2021   Macrocytic anemia 11/03/2021   Alcoholic cirrhosis of liver with ascites (HCC) 10/29/2021   Alcohol use disorder, severe, in early remission (HCC)    Malnutrition of moderate degree 10/15/2021   Abdominal distention    Acute respiratory failure with hypoxia (HCC)    Secondary esophageal varices with bleeding (HCC)    Hematemesis 10/10/2021   Hepatic encephalopathy    Secondary esophageal varices without bleeding (HCC)    Thrombocytopenia (HCC) 04/30/2021   Type 2 diabetes mellitus, without long-term current use of insulin (HCC) 04/30/2021   Hyponatremia 05/24/2019   Pneumonia due to COVID-19 virus 05/24/2019   Transaminitis 05/24/2019   Alcohol dependence with uncomplicated withdrawal (HCC) 05/24/2019    Hypokalemia 05/24/2019   COVID-19 05/24/2019    Past Surgical History:  Procedure Laterality Date   ESOPHAGOGASTRODUODENOSCOPY N/A 10/10/2021   Procedure: ESOPHAGOGASTRODUODENOSCOPY (EGD);  Surgeon: Midge Minium, MD;  Location: [facility] ENDOSCOPY;  Service: Endoscopy;  Laterality: N/A;    Prior to Admission medications   Medication Sig Start Date End Date Taking? Authorizing Provider  folic acid (FOLVITE) 1 MG tablet Take 1 tablet by mouth daily. 09/03/21 09/03/22 Yes [provider]  furosemide (LASIX) 40 MG tablet Take 1 tablet (40 mg total) by mouth daily. 11/04/21  Yes Esaw Grandchild A, DO  lactulose (CHRONULAC) 10 GM/15ML solution Take 45 mLs (30 g total) by mouth 3 (three) times daily. Goal of 3 soft bowel movements daily. 11/09/21 12/09/21 Yes Zigmund Daniel., MD  Multiple Vitamin (MULTIVITAMIN WITH MINERALS) TABS tablet Take 1 tablet by mouth daily. 10/17/21 11/16/21 Yes Leeroy Bock, MD  ondansetron (ZOFRAN-ODT) 4 MG disintegrating tablet Take 1 tablet (4 mg total) by mouth every 8 (eight) hours as needed for nausea or vomiting. 10/16/21  Yes Leeroy Bock, MD  pantoprazole (PROTONIX) 40 MG tablet Take 1 tablet (40 mg total) by mouth daily. 10/17/21 11/16/21 Yes Leeroy Bock, MD  rifaximin (XIFAXAN) 550 MG TABS tablet Take 1 tablet (550 mg total) by mouth 2 (two) times daily. 11/03/21  Yes Esaw Grandchild A, DO  spironolactone (ALDACTONE) 100 MG tablet Take 1 tablet (100 mg total) by mouth daily. 11/04/21  Yes Esaw Grandchild A, DO  tamsulosin (FLOMAX) 0.4 MG CAPS capsule Take 1 capsule (0.4 mg total) by mouth daily. 10/17/21  Yes Leeroy Bock, MD  thiamine 100  MG tablet Take 1 tablet (100 mg total) by mouth daily. 10/16/21  Yes Leeroy Bock, MD  feeding supplement (ENSURE ENLIVE / ENSURE PLUS) LIQD Take 237 mLs by mouth 3 (three) times daily between meals. 11/03/21   Pennie Banter, DO  Nutritional Supplements (,FEEDING SUPPLEMENT,  PROSOURCE PLUS) liquid Take 30 mLs by mouth 2 (two) times daily between meals. 11/03/21   Pennie Banter, DO    Allergies Patient has no known allergies.  Family History  Family history unknown: Yes    Social History Social History   Tobacco Use   Smoking status: Never   Smokeless tobacco: Never  Vaping Use   Vaping Use: Never used  Substance Use Topics   Alcohol use: Yes   Drug use: Never    Review of Systems  Unable to accurately assess due to patient's altered mentation. ____________________________________________   PHYSICAL EXAM:  VITAL SIGNS: Vitals:   11/12/21 0808 11/12/21 0930  BP: 121/83 (!) 100/54  Pulse: 96 69  Resp: 20 20  Temp: 97.8 F (36.6 C)   SpO2: 100% 100%    Constitutional: Alert and obviously confused.  Standing at the foot of the bed with family trying to redirect him and get him in bed.  He repeatedly holds up his right arm, pointing with his left hand at his right wrist, repeatedly in Spanish saying to look at his wrist. Eyes: Conjunctivae are normal. PERRL. EOMI. Head: Atraumatic. Nose: No congestion/rhinnorhea. Mouth/Throat: Mucous membranes are dry.  Oropharynx non-erythematous. Neck: No stridor. No cervical spine tenderness to palpation. Cardiovascular: Normal rate, regular rhythm. Grossly normal heart sounds.  Good peripheral circulation. Respiratory: Normal respiratory effort.  No retractions. Lungs CTAB. Gastrointestinal: Soft , slightly distended without tenderness or peritoneal features. Musculoskeletal: No lower extremity tenderness.  No joint effusions. No signs of acute trauma. Neurologic:   No gross focal neurologic deficits are appreciated. No gait instability noted. Skin:  Skin is warm, dry and intact. No rash noted. Psychiatric: Mood and affect are difficult to assess due to confusion. ____________________________________________   LABS (all labs ordered are listed, but only abnormal results are displayed)  Labs  Reviewed  COMPREHENSIVE METABOLIC PANEL - Abnormal; Notable for the following components:      Result Value   Sodium 134 (*)    Potassium 5.2 (*)    CO2 17 (*)    Glucose, Bld 190 (*)    BUN 35 (*)    Creatinine, Ser 1.34 (*)    Calcium 8.8 (*)    Albumin 3.3 (*)    AST 50 (*)    Alkaline Phosphatase 149 (*)    Total Bilirubin 2.6 (*)    All other components within normal limits  CBC - Abnormal; Notable for the following components:   RBC 2.72 (*)    Hemoglobin 9.8 (*)    HCT 28.0 (*)    MCV 102.9 (*)    MCH 36.0 (*)    All other components within normal limits  AMMONIA - Abnormal; Notable for the following components:   Ammonia 284 (*)    All other components within normal limits  PROTIME-INR - Abnormal; Notable for the following components:   Prothrombin Time 20.6 (*)    INR 1.8 (*)    All other components within normal limits  RESP PANEL BY RT-PCR (FLU A&B, COVID) ARPGX2  TYPE AND SCREEN   ____________________________________________  12 Lead EKG   ____________________________________________  RADIOLOGY  ED MD interpretation:    Official radiology  report(s): No results found.  ____________________________________________   PROCEDURES and INTERVENTIONS  Procedure(s) performed (including Critical Care):  .1-3 Lead EKG Interpretation Performed by: Delton Prairie, MD Authorized by: Delton Prairie, MD     Interpretation: normal     ECG rate:  74   ECG rate assessment: normal     Rhythm: sinus rhythm     Ectopy: none     Conduction: normal    Medications  LORazepam (ATIVAN) injection 2 mg (2 mg Intramuscular Given 11/12/21 0852)  haloperidol lactate (HALDOL) injection 5 mg (5 mg Intramuscular Given 11/12/21 0852)  lactated ringers bolus 1,000 mL (1,000 mLs Intravenous New Bag/Given 11/12/21 0935)    ____________________________________________   MDM / ED COURSE   55 year old male returns to the ED with evidence of hepatic encephalopathy requiring  medical admission.  Vitals are normal on room air and he has no evidence of neurologic or vascular deficits focally.  Just generalized confusion and repetitiveness.  No falls or trauma, less likely to be ICH or CVA.  Blood work demonstrates significant increase in his ammonia, suggestive of worsening of his hepatic encephalopathy.  Further has some signs of dehydration with prerenal azotemia and slight worsening of his renal dysfunction.  We will initiate IV fluids in the ED and discussed with medicine for admission.  Clinical Course as of 11/12/21 1023  Fri Nov 12, 2021  2924 Agitated and not allowing RN to place IV. Won't sit in bed. Family repeatedly trying to redirect, but he is poorly cooperative. Will require IM calming agents to facilitate workup and management [DS]    Clinical Course User Index [DS] Delton Prairie, MD    ____________________________________________   FINAL CLINICAL IMPRESSION(S) / ED DIAGNOSES  Final diagnoses:  Hepatic encephalopathy  Confusion     ED Discharge Orders     None        Serena Petterson Katrinka Blazing   Note:  This document was prepared using Dragon voice recognition software and may include unintentional dictation errors.    Delton Prairie, MD 11/12/21 1037

## 2021-11-12 NOTE — H&P (Signed)
History and Physical    Shawn Torres [id number] DOB: 1966-10-19 DOA: 11/12/2021  PCP: Caryl Never, MD   Patient coming from: Home  I have personally briefly reviewed patient's old medical records in [place]  Chief Complaint: Change in mental status  Most of the history was obtained from the ER notes as patient is unable to provide any history and his daughter who is his caregiver is unavailable.  HPI: [name] is a 55 y.o. male with medical history significant for alcoholic liver cirrhosis with ascites, GERD, BPH, diabetes mellitus, recent hospitalization for hepatic encephalopathy who presents to the emergency room from home for evaluation of change in mental status.  Family is concerned that he had been repetitive and agitated. In the ER he was agitated and kept trying to get up out of bed and had to be sedated. He is unable to provide any history at this time and I am unable to do review of systems on this patient. Sodium 134, potassium 4.2, chloride 108, bicarb 17, glucose 190, BUN 35, creatinine 1.34, calcium 8.8, alkaline phosphatase 149, albumin 3.3, AST 50, ALT 34, total protein 7.3, ammonia 284, total bilirubin 2.6, white count 7.5, hemoglobin 9.8, hematocrit 28.0, MCV 102.9, RDW 14.1, platelet count 170, PT 20.6, INR 1.8   ED Course: Patient is a 55 year old male with a history of alcoholic liver cirrhosis who was recently admitted to the hospital for hepatic encephalopathy. He was brought back to the ER today by his daughter for evaluation of mental status changes.  Patient was agitated and had to be sedated in the ER. His ammonia level is elevated and he will be admitted to the hospital for further evaluation.   Review of Systems: As per HPI otherwise all other systems reviewed and negative.    Past Medical History:  Diagnosis Date   Cirrhosis, alcoholic ([place])     Past Surgical History:  Procedure Laterality Date   ESOPHAGOGASTRODUODENOSCOPY N/A  10/10/2021   Procedure: ESOPHAGOGASTRODUODENOSCOPY (EGD);  Surgeon: [name], MD;  Location: [facility] ENDOSCOPY;  Service: Endoscopy;  Laterality: N/A;     reports that he has never smoked. He has never used smokeless tobacco. He reports current alcohol use. He reports that he does not use drugs.  No Known Allergies  Family History  Family history unknown: Yes      Prior to Admission medications   Medication Sig Start Date End Date Taking? Authorizing Provider  folic acid (FOLVITE) 1 MG tablet Take 1 tablet by mouth daily. 09/03/21 09/03/22 Yes [provider]  furosemide (LASIX) 40 MG tablet Take 1 tablet (40 mg total) by mouth daily. 11/04/21  Yes Nicole Kindred A, DO  lactulose (CHRONULAC) 10 GM/15ML solution Take 45 mLs (30 g total) by mouth 3 (three) times daily. Goal of 3 soft bowel movements daily. 11/09/21 12/09/21 Yes Elodia Florence., MD  Multiple Vitamin (MULTIVITAMIN WITH MINERALS) TABS tablet Take 1 tablet by mouth daily. 10/17/21 11/16/21 Yes Richarda Osmond, MD  ondansetron (ZOFRAN-ODT) 4 MG disintegrating tablet Take 1 tablet (4 mg total) by mouth every 8 (eight) hours as needed for nausea or vomiting. 10/16/21  Yes Richarda Osmond, MD  pantoprazole (PROTONIX) 40 MG tablet Take 1 tablet (40 mg total) by mouth daily. 10/17/21 11/16/21 Yes Richarda Osmond, MD  rifaximin (XIFAXAN) 550 MG TABS tablet Take 1 tablet (550 mg total) by mouth 2 (two) times daily. 11/03/21  Yes Nicole Kindred A, DO  spironolactone (ALDACTONE) 100 MG tablet  Take 1 tablet (100 mg total) by mouth daily. 11/04/21  Yes Esaw Grandchild A, DO  tamsulosin (FLOMAX) 0.4 MG CAPS capsule Take 1 capsule (0.4 mg total) by mouth daily. 10/17/21  Yes Leeroy Bock, MD  thiamine 100 MG tablet Take 1 tablet (100 mg total) by mouth daily. 10/16/21  Yes Leeroy Bock, MD  feeding supplement (ENSURE ENLIVE / ENSURE PLUS) LIQD Take 237 mLs by mouth 3 (three) times daily between meals.  11/03/21   Pennie Banter, DO  Nutritional Supplements (,FEEDING SUPPLEMENT, PROSOURCE PLUS) liquid Take 30 mLs by mouth 2 (two) times daily between meals. 11/03/21   Pennie Banter, DO    Physical Exam: Vitals:   11/12/21 0808 11/12/21 0930 11/12/21 1000 11/12/21 1030  BP: 121/83 (!) 100/54 (!) 100/58 (!) 104/51  Pulse: 96 69 67 71  Resp: 20 20    Temp: 97.8 F (36.6 C)     TempSrc: Oral     SpO2: 100% 100% 100% 100%  Weight:      Height:         Vitals:   11/12/21 0808 11/12/21 0930 11/12/21 1000 11/12/21 1030  BP: 121/83 (!) 100/54 (!) 100/58 (!) 104/51  Pulse: 96 69 67 71  Resp: 20 20    Temp: 97.8 F (36.6 C)     TempSrc: Oral     SpO2: 100% 100% 100% 100%  Weight:      Height:          Constitutional: Lethargic.  Opens eyes to deep sternal rub. Not in any apparent distress HEENT:      Head: Normocephalic and atraumatic.         Eyes: PERLA, EOMI, Conjunctivae are normal. Sclera is non-icteric.       Mouth/Throat: Mucous membranes are moist.       Neck: Supple with no signs of meningismus. Cardiovascular: Regular rate and rhythm. No murmurs, gallops, or rubs. 2+ symmetrical distal pulses are present . No JVD. No LE edema Respiratory: Respiratory effort normal .Lungs sounds clear bilaterally. No wheezes, crackles, or rhonchi.  Gastrointestinal: Soft, non tender, distended with positive bowel sounds.  Genitourinary: No CVA tenderness. Musculoskeletal: Nontender with normal range of motion in all extremities. No cyanosis, or erythema of extremities. Neurologic: Unable to assess. Skin: Skin is warm, dry.  Ecchymosis on forearms Psychiatric: Mood and affect are normal    Labs on Admission: I have personally reviewed following labs and imaging studies  CBC: Recent Labs  Lab 11/05/21 2309 11/06/21 0506 11/07/21 0430 11/08/21 0402 11/09/21 0447 11/12/21 0806  WBC 5.6 6.4 5.5 5.5 9.1 7.5  NEUTROABS 4.0  --  2.7 2.8 6.5  --   HGB 9.6* 8.5* 8.8* 7.6*  8.3* 9.8*  HCT 27.4* 23.7* 23.7* 21.9* 24.0* 28.0*  MCV 100.7* 100.9* 103.9* 98.6 98.8 102.9*  PLT 149* 139* 137* 125* 118* 170   Basic Metabolic Panel: Recent Labs  Lab 11/06/21 0506 11/07/21 0430 11/08/21 0402 11/09/21 0447 11/12/21 0806  NA 138 135 135 132* 134*  K 4.1 4.2 4.0 4.2 5.2*  CL 111 111 110 108 108  CO2 20* 17* 20* 18* 17*  GLUCOSE 119* 187* 154* 163* 190*  BUN 35* 34* 34* 33* 35*  CREATININE 1.29* 1.22 1.30* 1.17 1.34*  CALCIUM 8.4* 8.4* 8.1* 8.4* 8.8*  MG  --  2.2  --   --   --   PHOS  --  5.0*  --   --   --  GFR: Estimated Creatinine Clearance: 61.3 mL/min (A) (by C-G formula based on SCr of 1.34 mg/dL (H)). Liver Function Tests: Recent Labs  Lab 11/06/21 0506 11/07/21 0430 11/08/21 0402 11/09/21 0447 11/12/21 0806  AST 48* 43* 34 33 50*  ALT 27 27 25 24  34  ALKPHOS 95 109 111 100 149*  BILITOT 3.8* 2.8* 2.8* 2.7* 2.6*  PROT 6.6 6.3* 5.6* 6.1* 7.3  ALBUMIN 2.9* 2.8* 2.6* 3.0* 3.3*   No results for input(s): LIPASE, AMYLASE in the last 168 hours. Recent Labs  Lab 11/05/21 2309 11/06/21 0506 11/12/21 0806  AMMONIA 184* 103* 284*   Coagulation Profile: Recent Labs  Lab 11/06/21 1846 11/07/21 0430 11/08/21 0402 11/09/21 0854 11/12/21 0922  INR 2.0* 1.9* 1.8* 1.9* 1.8*   Cardiac Enzymes: No results for input(s): CKTOTAL, CKMB, CKMBINDEX, TROPONINI in the last 168 hours. BNP (last 3 results) No results for input(s): PROBNP in the last 8760 hours. HbA1C: No results for input(s): HGBA1C in the last 72 hours. CBG: Recent Labs  Lab 11/08/21 1235 11/08/21 1746 11/08/21 2050 11/09/21 0829 11/09/21 1153  GLUCAP 156* 146* 119* 130* 160*   Lipid Profile: No results for input(s): CHOL, HDL, LDLCALC, TRIG, CHOLHDL, LDLDIRECT in the last 72 hours. Thyroid Function Tests: No results for input(s): TSH, T4TOTAL, FREET4, T3FREE, THYROIDAB in the last 72 hours. Anemia Panel: No results for input(s): VITAMINB12, FOLATE, FERRITIN, TIBC, IRON,  RETICCTPCT in the last 72 hours. Urine analysis:    Component Value Date/Time   COLORURINE AMBER (A) 04/28/2021 1736   APPEARANCEUR CLEAR (A) 04/28/2021 1736   LABSPEC 1.012 04/28/2021 1736   PHURINE 6.0 04/28/2021 1736   GLUCOSEU NEGATIVE 04/28/2021 1736   HGBUR MODERATE (A) 04/28/2021 1736   BILIRUBINUR NEGATIVE 04/28/2021 1736   KETONESUR NEGATIVE 04/28/2021 1736   PROTEINUR 30 (A) 04/28/2021 1736   NITRITE NEGATIVE 04/28/2021 1736   LEUKOCYTESUR NEGATIVE 04/28/2021 1736    Radiological Exams on Admission: No results found.   Assessment/Plan Principal Problem:   Hepatic encephalopathy Active Problems:   Alcoholic cirrhosis of liver with ascites (HCC)   Type 2 diabetes mellitus, without long-term current use of insulin (HCC)   BPH (benign prostatic hyperplasia)     Patient is a 55 year old admitted to the hospital for hepatic encephalopathy    Hepatic encephalopathy Will give 1 dose of lactulose enema since patient is sedated and unable to take p.o. now. Resume oral lactulose once patient is more awake and alert Continue Xifaxan Place patient empirically on Rocephin for SBP prophylaxis Will need a safety sitter for now due to mental status changes    Alcoholic cirrhosis of liver with ascites (HCC) Continue spironolactone and furosemide Patient will need to be started on a beta-blocker once blood pressure tolerates Monitor electrolytes Continue folic acid and thiamine        GERD (gastroesophageal reflux disease) Continue  PPI     BPH (benign prostatic hyperplasia) Continue flomax     Type 2 diabetes mellitus, without long-term current use of insulin (HCC) Blood sugar checks every 4 hours while NPO      DVT prophylaxis: SCD Code Status: full code  Family Communication:  none.  Attempted to contact patient's daughter.  Unable to leave a voicemail at this time since she does not have one set up Disposition Plan: Back to previous home  environment Consults called: none  Status:At the time of admission, it appears that the appropriate admission status for this patient is inpatient. This is judged to be reasonable and  necessary to provide the required intensity of service to ensure the patient's safety given the presenting symptoms, physical exam findings, and initial radiographic and laboratory data in the context of their comorbid conditions. Patient requires inpatient status due to high intensity of service, high risk for further deterioration and high frequency of surveillance required.     Collier Bullock MD [facility]     11/12/2021, 10:55 AM

## 2021-11-12 NOTE — ED Notes (Signed)
This Rn called ICU to see if they could send someone down to insert a rectal tube as this RN is unfamiliar and has not completed this task before. ICU advised they were too busy.

## 2021-11-12 NOTE — ED Triage Notes (Signed)
Pt comes into the ED via [facility] from home c/o AMS.  Pt recently admitted here for high ammonia levels.  Pt is spanish speaking but continues to repeat the same thing over and over again.  H/o cirrhosis and has some abd distention.  Pt has even and unlabored respirations at this time.

## 2021-11-12 NOTE — ED Notes (Signed)
Pt continues sleeping at this time.

## 2021-11-12 NOTE — ED Notes (Addendum)
Some involuntary leg movements are noticed, but pt continues to sleep at this time.

## 2021-11-12 NOTE — Progress Notes (Signed)
Notified by RN that patient was hypotensive with systolic blood pressure 79/50.  Patient received 1 L fluid bolus with improvement in his blood pressure to 97/54. Noted to have a drop in his hemoglobin to 8.1g/dl We will type and screen and will transfuse packed RBC for hemoglobin less than 7g/dl

## 2021-11-12 NOTE — ED Notes (Signed)
MD made aware of pt being hypotensive. IV bolus of 1L of NS per verbal order. Given at this time./

## 2021-11-12 NOTE — ED Notes (Signed)
Writer into check on pt, pt currently sleeping at this time.

## 2021-11-12 NOTE — ED Notes (Signed)
RN to bedside. Pt trying to get out of bed. Daughter at bedside. Pt had to be sedated last time he was here to comply.

## 2021-11-12 NOTE — ED Notes (Signed)
This Clinical research associate into check on pt, pt currently sleeping.

## 2021-11-12 NOTE — ED Notes (Addendum)
This Clinical research associate into check on pt, pt continues to sleep. BP noted to be low-79/50, Crystal, RN notified.

## 2021-11-12 NOTE — ED Notes (Signed)
Holding ALL PO meds due to pt received ativan and haldol and I sleeping heavily.

## 2021-11-13 DIAGNOSIS — R0602 Shortness of breath: Secondary | ICD-10-CM

## 2021-11-13 DIAGNOSIS — R410 Disorientation, unspecified: Secondary | ICD-10-CM

## 2021-11-13 LAB — BASIC METABOLIC PANEL
Anion gap: 7 (ref 5–15)
BUN: 31 mg/dL — ABNORMAL HIGH (ref 6–20)
CO2: 17 mmol/L — ABNORMAL LOW (ref 22–32)
Calcium: 8.2 mg/dL — ABNORMAL LOW (ref 8.9–10.3)
Chloride: 113 mmol/L — ABNORMAL HIGH (ref 98–111)
Creatinine, Ser: 1.06 mg/dL (ref 0.61–1.24)
GFR, Estimated: >60 mL/min (ref >60)
Glucose, Bld: 107 mg/dL — ABNORMAL HIGH (ref 70–99)
Potassium: 4.3 mmol/L (ref 3.5–5.1)
Sodium: 137 mmol/L (ref 135–145)

## 2021-11-13 LAB — LACTIC ACID, PLASMA: Lactic Acid, Venous: 1.9 mmol/L (ref 0.5–1.9)

## 2021-11-13 MED ORDER — FUROSEMIDE 10 MG/ML IJ SOLN
40.0000 mg | INTRAMUSCULAR | Status: DC
  Filled 2021-11-13: qty 4

## 2021-11-13 NOTE — TOC Progression Note (Signed)
Transition of Care [facility]) - Progression Note    Patient Details  Name: Shawn Torres MRN: [id number] Date of Birth: 1966-05-31  [facility] CM/SW Contact  Bing Quarry, RN Phone Number: 11/13/2021, 10:33 AM  Clinical Narrative:    Per RN ED notes: "Pt comes into the ED via [facility] from home c/o AMS.  Pt recently admitted here for high ammonia levels.  Pt is spanish speaking but continues to repeat the same thing over and over again.  H/o cirrhosis and has some abd distention.  Pt has even and unlabored respirations at this time."  Admitted for hepatic encephalopathy diagnosis per provider notes. No discharge plan as yet. Gabriel Cirri RN CM      Barriers to Discharge: No Barriers Identified  Expected Discharge Plan and Services           Expected Discharge Date: 11/13/21               DME Arranged: N/A DME Agency: NA       HH Arranged: NA HH Agency: NA         Social Determinants of Health (SDOH) Interventions    Readmission Risk Interventions Readmission Risk Prevention Plan 10/12/2021  Transportation Screening Complete  PCP or Specialist Appt within 3-5 Days Complete  HRI or Home Care Consult Not Complete  HRI or Home Care Consult comments unsure of discharge needs  Social Work Consult for Recovery Care Planning/Counseling Complete  Palliative Care Screening Not Applicable  Medication Review [employer]) Complete

## 2021-11-14 NOTE — Discharge Summary (Signed)
Physician Discharge Summary   Patient: Shawn Torres MRN: [id number] DOB:   Admit date:     11/12/2021  Discharge date: 11/13/2021  Discharge Physician: Delfino Lovett   PCP: Kurtis Bushman, MD   Recommendations at discharge: 1.  Follow-up with outpatient providers as requested    Discharge Diagnoses Principal Problem:   Hepatic encephalopathy Active Problems:   Alcoholic cirrhosis of liver with ascites (HCC)   Type 2 diabetes mellitus, without long-term current use of insulin (HCC)   BPH (benign prostatic hyperplasia)   Confusion   SOB (shortness of breath)  Hospital Course    55 y.o. male with medical history significant for alcoholic liver cirrhosis with ascites, GERD, BPH, diabetes mellitus, recent hospitalization for hepatic encephalopathy was readmitted for altered mental status concerning for hepatic encephalopathy.  Hepatic encephalopathy He was given lactulose and Xifaxan with good response.  This was thought to be due to medication noncompliance at home.  He was counseled to stay compliant with his medications. He was back to his normal mental status in 24 hours.  Discharge plan was discussed with patient and his family member with Spanish interpreter via computer    Alcoholic cirrhosis of liver with ascites (HCC) Continue spironolactone and furosemide Counseled for alcohol cessation and outpatient GI follow-up  GERD (gastroesophageal reflux disease) Continue  PPI    BPH (benign prostatic hyperplasia) Continue flomax   Type 2 diabetes mellitus, without long-term current use of insulin (HCC)  Pain control - [place] Controlled Substance Reporting System database was reviewed. and patient was instructed, not to drive, operate heavy machinery, perform activities at heights, swimming or participation in water activities or provide baby-sitting services while on Pain, Sleep and Anxiety Medications; until their outpatient Physician has advised to do so again.  Also recommended to not to take more than prescribed Pain, Sleep and Anxiety Medications.   Disposition: Home Diet recommendation: Carb modified diet  DISCHARGE MEDICATION: Allergies as of 11/13/2021   No Known Allergies      Medication List     TAKE these medications    (feeding supplement) PROSource Plus liquid Take 30 mLs by mouth 2 (two) times daily between meals.   feeding supplement Liqd Take 237 mLs by mouth 3 (three) times daily between meals.   folic acid 1 MG tablet Commonly known as: FOLVITE Take 1 tablet by mouth daily.   furosemide 40 MG tablet Commonly known as: LASIX Take 1 tablet (40 mg total) by mouth daily.   lactulose 10 GM/15ML solution Commonly known as: CHRONULAC Take 45 mLs (30 g total) by mouth 3 (three) times daily. Goal of 3 soft bowel movements daily.   multivitamin with minerals Tabs tablet Take 1 tablet by mouth daily.   ondansetron 4 MG disintegrating tablet Commonly known as: ZOFRAN-ODT Take 1 tablet (4 mg total) by mouth every 8 (eight) hours as needed for nausea or vomiting.   pantoprazole 40 MG tablet Commonly known as: PROTONIX Take 1 tablet (40 mg total) by mouth daily.   rifaximin 550 MG Tabs tablet Commonly known as: XIFAXAN Take 1 tablet (550 mg total) by mouth 2 (two) times daily.   spironolactone 100 MG tablet Commonly known as: ALDACTONE Take 1 tablet (100 mg total) by mouth daily.   tamsulosin 0.4 MG Caps capsule Commonly known as: FLOMAX Take 1 capsule (0.4 mg total) by mouth daily.   thiamine 100 MG tablet Take 1 tablet (100 mg total) by mouth daily.        Follow-up  Information     Gladman, Cheral Marker, MD. Schedule an appointment as soon as possible for a visit in 3 day(s).   Specialty: Internal Medicine Why: [facility] Discharge F/UP   Patient to make own follow up appt office closed at this time Contact information: [address] [place] [address] [place] [address] [phone]                  Discharge Exam: Ceasar Mons Weights   11/12/21 0804  Weight: 77.8 kg    Constitutional: Awake and alert not in any apparent distress HEENT:      Head: Normocephalic and atraumatic.         Eyes: PERLA, EOMI, Conjunctivae are normal. Sclera is non-icteric.       Mouth/Throat: Mucous membranes are moist.       Neck: Supple with no signs of meningismus. Cardiovascular: Regular rate and rhythm. No murmurs, gallops, or rubs. 2+ symmetrical distal pulses are present . No JVD. No LE edema Respiratory: Respiratory effort normal .Lungs sounds clear bilaterally. No wheezes, crackles, or rhonchi.  Gastrointestinal: Soft, non tender, distended with positive bowel sounds.  Genitourinary: No CVA tenderness. Musculoskeletal: Nontender with normal range of motion in all extremities. No cyanosis, or erythema of extremities. Neurologic: Awake and alert, nonfocal Skin: Skin is warm, dry.  Ecchymosis on forearms Psychiatric: Mood and affect are normal       Condition at discharge: good  The results of significant diagnostics from this hospitalization (including imaging, microbiology, ancillary and laboratory) are listed below for reference.   Imaging Studies: DG Chest 2 View  Result Date: 11/05/2021 CLINICAL DATA:  Cough EXAM: CHEST - 2 VIEW COMPARISON:  10/29/2021 FINDINGS: Cardiomegaly. Unchanged mediastinal contours. Low lung volumes. Unchanged elevation of the right hemidiaphragm. No focal pulmonary opacity. The visualized skeletal structures are unremarkable. IMPRESSION: 1. Cardiomegaly. 2. No acute cardiopulmonary process. Electronically Signed   By: Wiliam Ke M.D.   On: 11/05/2021 23:37   CT HEAD WO CONTRAST ( )  Result Date: 11/05/2021 CLINICAL DATA:  Altered mental status. EXAM: CT HEAD WITHOUT CONTRAST TECHNIQUE: Contiguous axial images were obtained from the base of the skull through the vertex without intravenous contrast. COMPARISON:  October 10, 2021 FINDINGS: Brain: No  evidence of acute infarction, hemorrhage, hydrocephalus, extra-axial collection or mass lesion/mass effect. Vascular: No hyperdense vessel or unexpected calcification. Skull: Normal. Negative for fracture or focal lesion. Sinuses/Orbits: No acute finding. Other: None. IMPRESSION: No acute intracranial pathology. Electronically Signed   By: Aram Candela M.D.   On: 11/05/2021 23:52   US Paracentesis  Result Date: 11/08/2021 INDICATION: Recurrent ascites request received for paracentesis. EXAM: ULTRASOUND GUIDED  PARACENTESIS MEDICATIONS: Local 1% lidocaine only. COMPLICATIONS: None immediate. PROCEDURE: Informed written consent was obtained from the patient after a discussion of the risks, benefits and alternatives to treatment. A timeout was performed prior to the initiation of the procedure. Initial ultrasound scanning demonstrates a large amount of ascites within the right lower abdominal quadrant. The right lower abdomen was prepped and draped in the usual sterile fashion. 1% lidocaine was used for local anesthesia. Following this, a 19 gauge, 7-cm, Yueh catheter was introduced. An ultrasound image was saved for documentation purposes. The paracentesis was performed. The catheter was removed and a dressing was applied. The patient tolerated the procedure well without immediate post procedural complication. Patient received post-procedure intravenous albumin; see nursing notes for details. FINDINGS: A total of approximately 5.5 L of clear yellow fluid was removed. Samples were sent to  the laboratory as requested by the clinical team. IMPRESSION: Successful ultrasound-guided paracentesis yielding 5.5 liters of peritoneal fluid. This exam was performed by Pattricia Boss PA-C, and was supervised and interpreted by Dr. Juliette Alcide. Electronically Signed   By: Olive Bass M.D.   On: 11/08/2021 14:23   US Paracentesis  Result Date: 11/01/2021 INDICATION: Recurrent ascites request received for paracentesis.  EXAM: ULTRASOUND GUIDED  PARACENTESIS MEDICATIONS: Local 1% lidocaine only. COMPLICATIONS: None immediate. PROCEDURE: Informed written consent was obtained from the patient after a discussion of the risks, benefits and alternatives to treatment. A timeout was performed prior to the initiation of the procedure. Initial ultrasound scanning demonstrates a large amount of ascites within the left lower abdominal quadrant. The left lower abdomen was prepped and draped in the usual sterile fashion. 1% lidocaine was used for local anesthesia. Following this, a 19 gauge, 7-cm, Yueh catheter was introduced. An ultrasound image was saved for documentation purposes. The paracentesis was performed. The catheter was removed and a dressing was applied. The patient tolerated the procedure well without immediate post procedural complication. FINDINGS: A total of approximately 8 L of clear yellow fluid was removed. Samples were sent to the laboratory as requested by the clinical team. IMPRESSION: Successful ultrasound-guided paracentesis yielding 8 liters of peritoneal fluid. Read By: Pattricia Boss PA-C Electronically Signed   By: Irish Lack M.D.   On: 11/01/2021 10:48   DG Chest Port 1 View  Result Date: 11/12/2021 CLINICAL DATA:  High Mon yeah EXAM: PORTABLE CHEST 1 VIEW COMPARISON:  01/06/2021 FINDINGS: Normal cardiac silhouette. Low lung volumes for interval increase in central venous pulmonary congestion compared to prior. No pneumonia. No pleural effusion. No pneumothorax. IMPRESSION: Increasing central venous congestion. Electronically Signed   By: Genevive Bi M.D.   On: 11/12/2021 16:15   DG Chest Port 1 View  Result Date: 10/29/2021 CLINICAL DATA:  Altered mental status EXAM: PORTABLE CHEST 1 VIEW COMPARISON:  10/13/2021 FINDINGS: Cardiomegaly. Unchanged elevation of the right hemidiaphragm. No acute airspace opacity. The visualized skeletal structures are unremarkable. IMPRESSION: 1.  Cardiomegaly. 2. No  acute abnormality of the lungs. Unchanged elevation of the right hemidiaphragm. Electronically Signed   By: Jearld Lesch M.D.   On: 10/29/2021 13:03   US LIVER DOPPLER  Result Date: 11/06/2021 CLINICAL DATA:  55 year old male with history of alcoholic cirrhosis. EXAM: DUPLEX ULTRASOUND OF LIVER TECHNIQUE: Color and duplex Doppler ultrasound was performed to evaluate the hepatic in-flow and out-flow vessels. COMPARISON:  CT abdomen pelvis from 04/28/2021 FINDINGS: Liver: Diffusely increased parenchymal echogenicity. Nodular contour. No focal lesion, mass or intrahepatic biliary ductal dilatation. Main Portal Vein size: 1.3 cm Portal Vein Velocities (all antegrade) Main Prox:  29 cm/sec Main Mid: 22 cm/sec Main Dist:  19 cm/sec Right: 17 cm/sec Left: 26 cm/sec Hepatic Vein Velocities Right:  37 cm/sec Middle:  32 cm/sec Left:  19 cm/sec IVC: Present and patent with normal respiratory phasicity. Hepatic Artery Velocity:  73 cm/sec Splenic Vein Velocity:  16 cm/sec (antegrade) Spleen: 6.3 cm x 5.0 cm x 12.8 cm with a total volume of 210 cm^3 (411 cm^3 is upper limit normal) Portal Vein Occlusion/Thrombus: No Splenic Vein Occlusion/Thrombus: No Ascites: Large volume Varices: None IMPRESSION: Morphologic changes of hepatic cirrhosis with secondary signs of portal hypertension including large volume ascites. Patent portal veins with antegrade flow throughout. No evidence of hepatoma. Marliss Coots, MD [facility] Radiology Electronically Signed   By: Marliss Coots M.D.   On: 11/06/2021 13:26  Microbiology: Results for orders placed or performed during the hospital encounter of 11/12/21  Resp Panel by RT-PCR (Flu A&B, Covid) Nasopharyngeal Swab     Status: None   Collection Time: 11/12/21  9:22 AM   Specimen: Nasopharyngeal Swab; Nasopharyngeal(NP) swabs in vial transport medium  Result Value Ref Range Status   SARS Coronavirus 2 by RT PCR NEGATIVE NEGATIVE  Final    Comment: (NOTE) SARS-CoV-2 target nucleic acids are NOT DETECTED.  The SARS-CoV-2 RNA is generally detectable in upper respiratory specimens during the acute phase of infection. The lowest concentration of SARS-CoV-2 viral copies this assay can detect is 138 copies/mL. A negative result does not preclude SARS-Cov-2 infection and should not be used as the sole basis for treatment or other patient management decisions. A negative result may occur with  improper specimen collection/handling, submission of specimen other than nasopharyngeal swab, presence of viral mutation(s) within the areas targeted by this assay, and inadequate number of viral copies(<138 copies/mL). A negative result must be combined with clinical observations, patient history, and epidemiological information. The expected result is Negative.  Fact Sheet for Patients:  BloggerCourse.com  Fact Sheet for Healthcare Providers:  SeriousBroker.it  This test is no t yet approved or cleared by the [place] FDA and  has been authorized for detection and/or diagnosis of SARS-CoV-2 by FDA under an Emergency Use Authorization (EUA). This EUA will remain  in effect (meaning this test can be used) for the duration of the COVID-19 declaration under Section 564(b)(1) of the Act, 21 U.S.C.section 360bbb-3(b)(1), unless the authorization is terminated  or revoked sooner.       Influenza A by PCR NEGATIVE NEGATIVE Final   Influenza B by PCR NEGATIVE NEGATIVE Final    Comment: (NOTE) The Xpert Xpress SARS-CoV-2/FLU/RSV plus assay is intended as an aid in the diagnosis of influenza from Nasopharyngeal swab specimens and should not be used as a sole basis for treatment. Nasal washings and aspirates are unacceptable for Xpert Xpress SARS-CoV-2/FLU/RSV testing.  Fact Sheet for Patients: BloggerCourse.com  Fact Sheet for Healthcare  Providers: SeriousBroker.it  This test is not yet approved or cleared by the [place] FDA and has been authorized for detection and/or diagnosis of SARS-CoV-2 by FDA under an Emergency Use Authorization (EUA). This EUA will remain in effect (meaning this test can be used) for the duration of the COVID-19 declaration under Section 564(b)(1) of the Act, 21 U.S.C. section 360bbb-3(b)(1), unless the authorization is terminated or revoked.  Performed at [facility], [address] [place], [place] [address]     Labs: CBC: Recent Labs  Lab 11/08/21 0402 11/09/21 0447 11/12/21 0806 11/12/21 1531 11/12/21 2059  WBC 5.5 9.1 7.5  --   --   NEUTROABS 2.8 6.5  --   --   --   HGB 7.6* 8.3* 9.8* 8.1* 8.6*  HCT 21.9* 24.0* 28.0* 22.7* 23.4*  MCV 98.6 98.8 102.9*  --   --   PLT 125* 118* 170  --   --    Basic Metabolic Panel: Recent Labs  Lab 11/08/21 0402 11/09/21 0447 11/12/21 0806 11/13/21 0825  NA 135 132* 134* 137  K 4.0 4.2 5.2* 4.3  CL 110 108 108 113*  CO2 20* 18* 17* 17*  GLUCOSE 154* 163* 190* 107*  BUN 34* 33* 35* 31*  CREATININE 1.30* 1.17 1.34* 1.06  CALCIUM 8.1* 8.4* 8.8* 8.2*   Liver Function Tests: Recent Labs  Lab 11/08/21 0402 11/09/21 0447 11/12/21 0806  AST  34 33 50*  ALT 25 24 34  ALKPHOS 111 100 149*  BILITOT 2.8* 2.7* 2.6*  PROT 5.6* 6.1* 7.3  ALBUMIN 2.6* 3.0* 3.3*   CBG: Recent Labs  Lab 11/08/21 1235 11/08/21 1746 11/08/21 2050 11/09/21 0829 11/09/21 1153  GLUCAP 156* 146* 119* 130* 160*    Discharge time spent: greater than 30 minutes.  Signed:  Delfino Lovett MD.  [facility] 11/14/2021

## 2021-12-06 ENCOUNTER — Ambulatory Visit: Payer: Self-pay | Admitting: Gastroenterology

## 2021-12-06 ENCOUNTER — Other Ambulatory Visit: Payer: Self-pay

## 2021-12-11 ENCOUNTER — Other Ambulatory Visit: Payer: Self-pay

## 2021-12-11 ENCOUNTER — Inpatient Hospital Stay: Payer: Medicaid Other | Admitting: Anesthesiology

## 2021-12-11 ENCOUNTER — Encounter
Admission: EM | Disposition: A | Discharge status: Home / Self Care | Payer: Self-pay | Source: Home / Self Care | Attending: Internal Medicine

## 2021-12-11 ENCOUNTER — Inpatient Hospital Stay
Admission: EM | Admit: 2021-12-11 | Discharge: 2021-12-14 | DRG: 432 | Disposition: A | Discharge status: Home / Self Care | Payer: Medicaid Other | Attending: Internal Medicine | Admitting: Internal Medicine

## 2021-12-11 DIAGNOSIS — D62 Acute posthemorrhagic anemia: Secondary | ICD-10-CM | POA: Diagnosis present

## 2021-12-11 DIAGNOSIS — G9341 Metabolic encephalopathy: Secondary | ICD-10-CM | POA: Diagnosis present

## 2021-12-11 DIAGNOSIS — Z20822 Contact with and (suspected) exposure to covid-19: Secondary | ICD-10-CM | POA: Diagnosis present

## 2021-12-11 DIAGNOSIS — E1122 Type 2 diabetes mellitus with diabetic chronic kidney disease: Secondary | ICD-10-CM | POA: Diagnosis present

## 2021-12-11 DIAGNOSIS — I8511 Secondary esophageal varices with bleeding: Secondary | ICD-10-CM | POA: Diagnosis present

## 2021-12-11 DIAGNOSIS — E1165 Type 2 diabetes mellitus with hyperglycemia: Secondary | ICD-10-CM | POA: Diagnosis present

## 2021-12-11 DIAGNOSIS — K922 Gastrointestinal hemorrhage, unspecified: Secondary | ICD-10-CM | POA: Diagnosis present

## 2021-12-11 DIAGNOSIS — E872 Acidosis, unspecified: Secondary | ICD-10-CM | POA: Diagnosis present

## 2021-12-11 DIAGNOSIS — K7031 Alcoholic cirrhosis of liver with ascites: Principal | ICD-10-CM | POA: Diagnosis present

## 2021-12-11 DIAGNOSIS — K219 Gastro-esophageal reflux disease without esophagitis: Secondary | ICD-10-CM | POA: Diagnosis present

## 2021-12-11 DIAGNOSIS — Z79899 Other long term (current) drug therapy: Secondary | ICD-10-CM | POA: Diagnosis not present

## 2021-12-11 DIAGNOSIS — D696 Thrombocytopenia, unspecified: Secondary | ICD-10-CM | POA: Diagnosis present

## 2021-12-11 DIAGNOSIS — N179 Acute kidney failure, unspecified: Secondary | ICD-10-CM | POA: Diagnosis present

## 2021-12-11 DIAGNOSIS — R791 Abnormal coagulation profile: Secondary | ICD-10-CM | POA: Diagnosis present

## 2021-12-11 DIAGNOSIS — R55 Syncope and collapse: Secondary | ICD-10-CM | POA: Diagnosis present

## 2021-12-11 DIAGNOSIS — E871 Hypo-osmolality and hyponatremia: Secondary | ICD-10-CM | POA: Diagnosis present

## 2021-12-11 DIAGNOSIS — N1831 Chronic kidney disease, stage 3a: Secondary | ICD-10-CM | POA: Diagnosis present

## 2021-12-11 DIAGNOSIS — E8809 Other disorders of plasma-protein metabolism, not elsewhere classified: Secondary | ICD-10-CM | POA: Diagnosis present

## 2021-12-11 DIAGNOSIS — E119 Type 2 diabetes mellitus without complications: Secondary | ICD-10-CM

## 2021-12-11 DIAGNOSIS — K766 Portal hypertension: Secondary | ICD-10-CM | POA: Diagnosis present

## 2021-12-11 DIAGNOSIS — F1010 Alcohol abuse, uncomplicated: Secondary | ICD-10-CM | POA: Diagnosis present

## 2021-12-11 DIAGNOSIS — K920 Hematemesis: Secondary | ICD-10-CM

## 2021-12-11 DIAGNOSIS — K3189 Other diseases of stomach and duodenum: Secondary | ICD-10-CM | POA: Diagnosis present

## 2021-12-11 HISTORY — PX: ESOPHAGOGASTRODUODENOSCOPY (EGD) WITH PROPOFOL: SHX5813

## 2021-12-11 LAB — RESP PANEL BY RT-PCR (FLU A&B, COVID) ARPGX2
Influenza A by PCR: NEGATIVE
Influenza B by PCR: NEGATIVE
SARS Coronavirus 2 by RT PCR: NEGATIVE

## 2021-12-11 LAB — COMPREHENSIVE METABOLIC PANEL
ALT: 16 U/L (ref 0–44)
AST: 34 U/L (ref 15–41)
Albumin: 2.9 g/dL — ABNORMAL LOW (ref 3.5–5.0)
Alkaline Phosphatase: 121 U/L (ref 38–126)
Anion gap: 10 (ref 5–15)
BUN: 61 mg/dL — ABNORMAL HIGH (ref 6–20)
CO2: 17 mmol/L — ABNORMAL LOW (ref 22–32)
Calcium: 8.2 mg/dL — ABNORMAL LOW (ref 8.9–10.3)
Chloride: 107 mmol/L (ref 98–111)
Creatinine, Ser: 1.53 mg/dL — ABNORMAL HIGH (ref 0.61–1.24)
GFR, Estimated: 54 mL/min — ABNORMAL LOW (ref >60)
Glucose, Bld: 169 mg/dL — ABNORMAL HIGH (ref 70–99)
Potassium: 4.8 mmol/L (ref 3.5–5.1)
Sodium: 134 mmol/L — ABNORMAL LOW (ref 135–145)
Total Bilirubin: 1.7 mg/dL — ABNORMAL HIGH (ref 0.3–1.2)
Total Protein: 6.1 g/dL — ABNORMAL LOW (ref 6.5–8.1)

## 2021-12-11 LAB — CBC
HCT: 18.2 % — ABNORMAL LOW (ref 39.0–52.0)
Hemoglobin: 6.4 g/dL — ABNORMAL LOW (ref 13.0–17.0)
MCH: 36 pg — ABNORMAL HIGH (ref 26.0–34.0)
MCHC: 35.2 g/dL (ref 30.0–36.0)
MCV: 102.2 fL — ABNORMAL HIGH (ref 80.0–100.0)
Platelets: 136 10*3/uL — ABNORMAL LOW (ref 150–400)
RBC: 1.78 MIL/uL — ABNORMAL LOW (ref 4.22–5.81)
RDW: 14.7 % (ref 11.5–15.5)
WBC: 8.8 10*3/uL (ref 4.0–10.5)
nRBC: 0 % (ref 0.0–0.2)

## 2021-12-11 LAB — PROTIME-INR
INR: 2.3 — ABNORMAL HIGH (ref 0.8–1.2)
Prothrombin Time: 25.1 seconds — ABNORMAL HIGH (ref 11.4–15.2)

## 2021-12-11 LAB — HEMOGLOBIN AND HEMATOCRIT, BLOOD
HCT: 24.1 % — ABNORMAL LOW (ref 39.0–52.0)
Hemoglobin: 8.6 g/dL — ABNORMAL LOW (ref 13.0–17.0)

## 2021-12-11 LAB — APTT: aPTT: 40 seconds — ABNORMAL HIGH (ref 24–36)

## 2021-12-11 LAB — MRSA NEXT GEN BY PCR, NASAL: MRSA by PCR Next Gen: NOT DETECTED

## 2021-12-11 SURGERY — ESOPHAGOGASTRODUODENOSCOPY (EGD) WITH PROPOFOL
Anesthesia: General

## 2021-12-11 MED ORDER — ONDANSETRON HCL 4 MG/2ML IJ SOLN: 4.0000 mg | Freq: Four times a day (QID) | INTRAMUSCULAR | Status: DC | PRN

## 2021-12-11 MED ORDER — THIAMINE HCL 100 MG PO TABS
100.0000 mg | ORAL_TABLET | Freq: Every day | ORAL | Status: DC
  Administered 2021-12-12 – 2021-12-14 (×3): 100 mg via ORAL
  Filled 2021-12-11 (×3): qty 1

## 2021-12-11 MED ORDER — PANTOPRAZOLE INFUSION (NEW) - SIMPLE MED
8.0000 mg/h | INTRAVENOUS | Status: DC
  Administered 2021-12-11 – 2021-12-12 (×2): 8 mg/h via INTRAVENOUS
  Filled 2021-12-11: qty 100
  Filled 2021-12-11: qty 80
  Filled 2021-12-11: qty 100

## 2021-12-11 MED ORDER — SODIUM CHLORIDE 0.9 % IV SOLN: INTRAVENOUS | Status: DC

## 2021-12-11 MED ORDER — LIDOCAINE HCL (CARDIAC) PF 100 MG/5ML IV SOSY
PREFILLED_SYRINGE | INTRAVENOUS | Status: DC | PRN
  Administered 2021-12-11: 80 mg via INTRAVENOUS

## 2021-12-11 MED ORDER — METOCLOPRAMIDE HCL 5 MG/ML IJ SOLN
10.0000 mg | Freq: Once | INTRAMUSCULAR | Status: AC
  Administered 2021-12-11: 10 mg via INTRAVENOUS
  Filled 2021-12-11: qty 2

## 2021-12-11 MED ORDER — SODIUM CHLORIDE 0.9 % IV SOLN
50.0000 ug/h | INTRAVENOUS | Status: DC
  Administered 2021-12-11 – 2021-12-14 (×6): 50 ug/h via INTRAVENOUS
  Filled 2021-12-11 (×10): qty 1

## 2021-12-11 MED ORDER — MIDAZOLAM HCL 2 MG/2ML IJ SOLN
INTRAMUSCULAR | Status: AC
  Filled 2021-12-11: qty 2

## 2021-12-11 MED ORDER — DEXAMETHASONE SODIUM PHOSPHATE 10 MG/ML IJ SOLN
INTRAMUSCULAR | Status: AC
  Filled 2021-12-11: qty 1

## 2021-12-11 MED ORDER — MIDAZOLAM HCL 2 MG/2ML IJ SOLN
INTRAMUSCULAR | Status: DC | PRN
  Administered 2021-12-11: 2 mg via INTRAVENOUS

## 2021-12-11 MED ORDER — PROPOFOL 10 MG/ML IV BOLUS
INTRAVENOUS | Status: DC | PRN
  Administered 2021-12-11: 150 mg via INTRAVENOUS

## 2021-12-11 MED ORDER — ONDANSETRON HCL 4 MG PO TABS: 4.0000 mg | ORAL_TABLET | Freq: Four times a day (QID) | ORAL | Status: DC | PRN

## 2021-12-11 MED ORDER — PANTOPRAZOLE 80MG IVPB - SIMPLE MED
80.0000 mg | Freq: Once | INTRAVENOUS | Status: DC
  Filled 2021-12-11 (×2): qty 100

## 2021-12-11 MED ORDER — DEXAMETHASONE SODIUM PHOSPHATE 10 MG/ML IJ SOLN
INTRAMUSCULAR | Status: DC | PRN
Start: 2021-12-11 — End: 2021-12-11
  Administered 2021-12-11: 5 mg via INTRAVENOUS

## 2021-12-11 MED ORDER — PROPOFOL 10 MG/ML IV BOLUS
INTRAVENOUS | Status: AC
  Filled 2021-12-11: qty 40

## 2021-12-11 MED ORDER — CEFTRIAXONE SODIUM 1 G IJ SOLR
1.0000 g | Freq: Once | INTRAMUSCULAR | Status: AC
  Administered 2021-12-11: 1 g via INTRAVENOUS
  Filled 2021-12-11: qty 10

## 2021-12-11 MED ORDER — PROPOFOL 500 MG/50ML IV EMUL
INTRAVENOUS | Status: DC | PRN
  Administered 2021-12-11: 75 ug/kg/min via INTRAVENOUS

## 2021-12-11 MED ORDER — SODIUM CHLORIDE 0.9 % IV SOLN
2.0000 g | INTRAVENOUS | Status: DC
  Administered 2021-12-12 – 2021-12-14 (×3): 2 g via INTRAVENOUS
  Filled 2021-12-11: qty 20
  Filled 2021-12-11 (×2): qty 2
  Filled 2021-12-11: qty 20

## 2021-12-11 MED ORDER — RIFAXIMIN 550 MG PO TABS
550.0000 mg | ORAL_TABLET | Freq: Two times a day (BID) | ORAL | Status: DC
  Administered 2021-12-12 – 2021-12-14 (×5): 550 mg via ORAL
  Filled 2021-12-11 (×6): qty 1

## 2021-12-11 MED ORDER — FOLIC ACID 1 MG PO TABS
1.0000 mg | ORAL_TABLET | Freq: Every day | ORAL | Status: DC
Start: 2021-12-12 — End: 2021-12-14
  Administered 2021-12-12 – 2021-12-14 (×3): 1 mg via ORAL
  Filled 2021-12-11 (×3): qty 1

## 2021-12-11 MED ORDER — ENSURE ENLIVE PO LIQD
237.0000 mL | Freq: Three times a day (TID) | ORAL | Status: DC
  Administered 2021-12-14: 11:00:00 237 mL via ORAL

## 2021-12-11 MED ORDER — ONDANSETRON HCL 4 MG/2ML IJ SOLN: 4.0000 mg | Freq: Once | INTRAMUSCULAR | Status: DC | PRN

## 2021-12-11 MED ORDER — ONDANSETRON HCL 4 MG/2ML IJ SOLN: 4.0000 mg | Freq: Once | INTRAMUSCULAR | Status: AC

## 2021-12-11 MED ORDER — EPHEDRINE SULFATE (PRESSORS) 50 MG/ML IJ SOLN
INTRAMUSCULAR | Status: DC | PRN
  Administered 2021-12-11 (×3): 5 mg via INTRAVENOUS

## 2021-12-11 MED ORDER — LIDOCAINE HCL (PF) 2 % IJ SOLN
INTRAMUSCULAR | Status: AC
  Filled 2021-12-11: qty 5

## 2021-12-11 MED ORDER — ONDANSETRON HCL 4 MG/2ML IJ SOLN
4.0000 mg | Freq: Once | INTRAMUSCULAR | Status: AC
  Administered 2021-12-11: 4 mg via INTRAVENOUS
  Filled 2021-12-11: qty 2

## 2021-12-11 MED ORDER — OCTREOTIDE LOAD VIA INFUSION
50.0000 ug | Freq: Once | INTRAVENOUS | Status: AC
Start: 2021-12-11 — End: 2021-12-11
  Administered 2021-12-11: 50 ug via INTRAVENOUS
  Filled 2021-12-11: qty 25

## 2021-12-11 MED ORDER — SUCCINYLCHOLINE CHLORIDE 200 MG/10ML IV SOSY
PREFILLED_SYRINGE | INTRAVENOUS | Status: AC
  Filled 2021-12-11: qty 10

## 2021-12-11 MED ORDER — ONDANSETRON HCL 4 MG/2ML IJ SOLN
INTRAMUSCULAR | Status: AC
  Administered 2021-12-11: 4 mg via INTRAVENOUS
  Filled 2021-12-11: qty 2

## 2021-12-11 MED ORDER — VITAMIN K1 10 MG/ML IJ SOLN
10.0000 mg | Freq: Once | INTRAVENOUS | Status: AC
  Administered 2021-12-11: 10 mg via INTRAVENOUS
  Filled 2021-12-11: qty 1

## 2021-12-11 MED ORDER — SODIUM CHLORIDE 0.9 % IV SOLN
10.0000 mL/h | Freq: Once | INTRAVENOUS | Status: AC
  Administered 2021-12-11: 10 mL/h via INTRAVENOUS

## 2021-12-11 MED ORDER — SUCCINYLCHOLINE CHLORIDE 200 MG/10ML IV SOSY
PREFILLED_SYRINGE | INTRAVENOUS | Status: DC | PRN
  Administered 2021-12-11: 100 mg via INTRAVENOUS

## 2021-12-11 MED ORDER — EPHEDRINE 5 MG/ML INJ
INTRAVENOUS | Status: AC
  Filled 2021-12-11: qty 5

## 2021-12-11 MED ORDER — PANTOPRAZOLE SODIUM 40 MG IV SOLR: 40.0000 mg | Freq: Two times a day (BID) | INTRAVENOUS | Status: DC

## 2021-12-11 MED ORDER — TAMSULOSIN HCL 0.4 MG PO CAPS
0.4000 mg | ORAL_CAPSULE | Freq: Every day | ORAL | Status: DC
  Administered 2021-12-12 – 2021-12-14 (×3): 0.4 mg via ORAL
  Filled 2021-12-11 (×3): qty 1

## 2021-12-11 NOTE — ED Triage Notes (Addendum)
Pt states he's been vomiting dark red blood since 4 am today. Pt denies dizziness, CP, SOB, diarrhea. Pt is AOX4, NAD noted. Pt states he has had this happen before and "they put a rubber band in my throat." Pt unsure what procedure was done, unsure if he takes blood thinner.

## 2021-12-11 NOTE — Anesthesia Procedure Notes (Signed)
Procedure Name: Intubation Date/Time: 12/11/2021 3:56 PM Performed by: Garner Nash, CRNA Pre-anesthesia Checklist: Patient identified, Emergency Drugs available, Suction available and Patient being monitored Patient Re-evaluated:Patient Re-evaluated prior to induction Oxygen Delivery Method: Circle system utilized Preoxygenation: Pre-oxygenation with 100% oxygen Induction Type: IV induction Laryngoscope Size: Mac and 3 Grade View: Grade I Tube type: Oral Tube size: 7.5 mm Number of attempts: 1 Placement Confirmation: ETT inserted through vocal cords under direct vision, positive ETCO2 and breath sounds checked- equal and bilateral Secured at: 22 cm Tube secured with: Tape Dental Injury: Teeth and Oropharynx as per pre-operative assessment

## 2021-12-11 NOTE — Interval H&P Note (Signed)
History and Physical Interval Note: Preprocedure H&P from 12/11/21  was reviewed and there was no interval change after seeing and examining the patient.  Written consent was obtained from the patient's daughter after discussion of risks, benefits, and alternatives. Patient's daughter and patient have consented to proceed with Esophagogastroduodenoscopy with possible intervention   12/11/2021 2:52 PM  Shawn Torres  has presented today for surgery, with the diagnosis of hematemesis; h/o eso varices.  The various methods of treatment have been discussed with the patient and family. After consideration of risks, benefits and other options for treatment, the patient has consented to  Procedure(s): ESOPHAGOGASTRODUODENOSCOPY (EGD) WITH PROPOFOL (N/A) as a surgical intervention.  The patient's history has been reviewed, patient examined, no change in status, stable for surgery.  I have reviewed the patient's chart and labs.  Questions were answered to the patient's satisfaction.     [name]

## 2021-12-11 NOTE — Transfer of Care (Signed)
Immediate Anesthesia Transfer of Care Note  Patient: Shawn Torres  Procedure(s) Performed: ESOPHAGOGASTRODUODENOSCOPY (EGD) WITH PROPOFOL  Patient Location: PACU  Anesthesia Type:General  Level of Consciousness: drowsy  Airway & Oxygen Therapy: Patient connected to nasal cannula oxygen  Post-op Assessment: Report given to RN  Post vital signs: stable  Last Vitals:  Vitals Value Taken Time  BP 111/56 12/11/21 1645  Temp    Pulse 79 12/11/21 1647  Resp 20 12/11/21 1647  SpO2 100 % 12/11/21 1647  Vitals shown include unvalidated device data.  Last Pain:  Vitals:   12/11/21 1414  TempSrc: Oral  PainSc:          Complications:  Encounter Notable Events  Notable Event Outcome Phase Comment  Pseudocholinesterase deficiency Resolved in Lab Intraprocedure 0/4 TOF for around 45 minutes after succinylcholine administration

## 2021-12-11 NOTE — Consult Note (Signed)
Inpatient Consultation   Patient ID: Shawn Torres is a 56 y.o. male.  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 Daughter is at bedside who helps provide translation and history.   Pt noted at 0400 that he developed hematemesis. Had some dark stool yesterday. Upon presentation, hgb 6.4 (from 8.6 on last check in December), BUN Cr 61/1.53; He did have active emesis in the ed with blood. Pt feels his mentation isn't as sharp as it usually is but he has been keeping up with his lactulose and his diuretics.  He denies abdominal pain. No alcohol use. No fevers/chills. Vital signs have been stable. Protonix, octreotide, and abx started in ED. He is scheduled to receive 2 u prbc  Daughter- Clarita Crane [phone]  Denies NSAIDs, Anti-plt agents, and anticoagulants Denies family history of gastrointestinal disease and malignancy Previous Endoscopies: multiple EGD 09/2021-grade 3 esophageal varices with stigmata of recent bleeding status post EVL   Past Medical History:  Diagnosis Date   Cirrhosis, alcoholic ([place])     Past Surgical History:  Procedure Laterality Date   ESOPHAGOGASTRODUODENOSCOPY N/A 10/10/2021   Procedure: ESOPHAGOGASTRODUODENOSCOPY (EGD);  Surgeon: [name], MD;  Location: [facility] ENDOSCOPY;  Service: Endoscopy;  Laterality: N/A;    No Known Allergies  Family History  Family history unknown: Yes    Social History   Tobacco Use   Smoking status: Never   Smokeless tobacco: Never  Vaping Use   Vaping Use: Never used  Substance Use Topics   Alcohol use: Yes   Drug use: Never     Pertinent GI related history and allergies were  reviewed with the patient  Review of Systems  Constitutional:  Negative for activity change, appetite change, chills, diaphoresis, fatigue, fever and unexpected weight change.  HENT:  Negative for trouble swallowing and voice change.   Respiratory:  Negative for shortness of breath and wheezing.   Cardiovascular:  Negative for chest pain, palpitations and leg swelling.  Gastrointestinal:  Positive for blood in stool (melena) and vomiting (hemetemesis). Negative for abdominal distention, abdominal pain, anal bleeding, constipation, diarrhea and nausea.  Musculoskeletal:  Negative for arthralgias and myalgias.  Skin:  Negative for color change and pallor.  Neurological:  Negative for dizziness, syncope and weakness.  Psychiatric/Behavioral:  Positive for confusion and decreased concentration. The patient is not nervous/anxious.   All other systems reviewed and are negative.   Medications Home Medications No current facility-administered medications on file prior to encounter.   Current Outpatient Medications on File Prior to Encounter  Medication Sig Dispense Refill   carvedilol (COREG) 6.25 MG tablet Take by mouth.     folic acid (FOLVITE) 1 MG tablet Take 1 tablet by mouth daily.     furosemide (LASIX) 40 MG tablet Take 1 tablet (40 mg total) by mouth daily. 30 tablet 2   lactulose (CHRONULAC) 10 GM/15ML solution Take by mouth.     spironolactone (ALDACTONE) 100 MG tablet Take 1 tablet (100 mg total) by mouth daily. 30 tablet 2   tamsulosin (FLOMAX) 0.4 MG CAPS capsule Take 1 capsule (0.4 mg total) by mouth daily. 30 capsule 0   thiamine 100 MG tablet Take 1 tablet (100 mg total) by  mouth daily. 30 tablet 0   feeding supplement (ENSURE ENLIVE / ENSURE PLUS) LIQD Take 237 mLs by mouth 3 (three) times daily between meals. 237 mL 12   Nutritional Supplements (,FEEDING SUPPLEMENT, PROSOURCE PLUS) liquid Take 30 mLs by mouth 2 (two) times daily between meals. 887 mL 1   ondansetron  (ZOFRAN-ODT) 4 MG disintegrating tablet Take 1 tablet (4 mg total) by mouth every 8 (eight) hours as needed for nausea or vomiting. (Patient not taking: Reported on 12/11/2021) 20 tablet 0   pantoprazole (PROTONIX) 40 MG tablet Take by mouth. (Patient not taking: Reported on 12/11/2021)     rifaximin (XIFAXAN) 550 MG TABS tablet Take by mouth. (Patient not taking: Reported on 12/11/2021)     Pertinent GI related medications were reviewed with the patient  Inpatient Medications  Current Facility-Administered Medications:    0.9 %  sodium chloride infusion, 10 mL/hr, Intravenous, Once, Vladimir Crofts, MD   0.9 %  sodium chloride infusion, , Intravenous, Continuous, Annamaria Helling, DO   [COMPLETED] octreotide (SANDOSTATIN) 2 mcg/mL load via infusion 50 mcg, 50 mcg, Intravenous, Once, 50 mcg at 12/11/21 1420 **AND** octreotide (SANDOSTATIN) 500 mcg in sodium chloride 0.9 % 250 mL (2 mcg/mL) infusion, 50 mcg/hr, Intravenous, Continuous, Vladimir Crofts, MD, Last Rate: 25 mL/hr at 12/11/21 1420, 50 mcg/hr at 12/11/21 1420   pantoprazole (PROTONIX) 80 mg /NS 100 mL IVPB, 80 mg, Intravenous, Once, Vladimir Crofts, MD   Derrill Memo ON 12/15/2021] pantoprazole (PROTONIX) injection 40 mg, 40 mg, Intravenous, Q12H, Vladimir Crofts, MD   pantoprozole (PROTONIX) 80 mg /NS 100 mL infusion, 8 mg/hr, Intravenous, Continuous, Vladimir Crofts, MD   phytonadione (VITAMIN K) 10 mg in dextrose 5 % 50 mL IVPB, 10 mg, Intravenous, Once, Annamaria Helling, DO  Current Outpatient Medications:    carvedilol (COREG) 6.25 MG tablet, Take by mouth., Disp: , Rfl:    folic acid (FOLVITE) 1 MG tablet, Take 1 tablet by mouth daily., Disp: , Rfl:    furosemide (LASIX) 40 MG tablet, Take 1 tablet (40 mg total) by mouth daily., Disp: 30 tablet, Rfl: 2   lactulose (CHRONULAC) 10 GM/15ML solution, Take by mouth., Disp: , Rfl:    spironolactone (ALDACTONE) 100 MG tablet, Take 1 tablet (100 mg total) by mouth daily., Disp: 30 tablet, Rfl: 2    tamsulosin (FLOMAX) 0.4 MG CAPS capsule, Take 1 capsule (0.4 mg total) by mouth daily., Disp: 30 capsule, Rfl: 0   thiamine 100 MG tablet, Take 1 tablet (100 mg total) by mouth daily., Disp: 30 tablet, Rfl: 0   feeding supplement (ENSURE ENLIVE / ENSURE PLUS) LIQD, Take 237 mLs by mouth 3 (three) times daily between meals., Disp: 237 mL, Rfl: 12   Nutritional Supplements (,FEEDING SUPPLEMENT, PROSOURCE PLUS) liquid, Take 30 mLs by mouth 2 (two) times daily between meals., Disp: 887 mL, Rfl: 1   ondansetron (ZOFRAN-ODT) 4 MG disintegrating tablet, Take 1 tablet (4 mg total) by mouth every 8 (eight) hours as needed for nausea or vomiting. (Patient not taking: Reported on 12/11/2021), Disp: 20 tablet, Rfl: 0   pantoprazole (PROTONIX) 40 MG tablet, Take by mouth. (Patient not taking: Reported on 12/11/2021), Disp: , Rfl:    rifaximin (XIFAXAN) 550 MG TABS tablet, Take by mouth. (Patient not taking: Reported on 12/11/2021), Disp: , Rfl:   sodium chloride     sodium chloride     octreotide  (SANDOSTATIN)    IV infusion 50 mcg/hr (12/11/21 1420)   pantoprazole     pantoprazole  phytonadione (VITAMIN K) IV         Objective   Vitals:   12/11/21 1206 12/11/21 1214 12/11/21 1414 12/11/21 1429  BP: (!) 152/91  102/60 124/75  Pulse: 80  70 72  Resp: 19  16 18   Temp:  97.9 F (36.6 C) (!) 97.4 F (36.3 C) 97.7 F (36.5 C)  TempSrc:  Oral Oral   SpO2: 100%  100% 100%     Physical Exam Vitals and nursing note reviewed.  Constitutional:      General: He is in acute distress (mild).     Appearance: He is ill-appearing (acutely and chronically). He is not toxic-appearing or diaphoretic.     Comments: Shirt stained with dark emesis  HENT:     Head: Normocephalic and atraumatic.     Nose: Nose normal.     Mouth/Throat:     Mouth: Mucous membranes are dry.     Pharynx: Oropharynx is clear.  Eyes:     General: No scleral icterus.    Extraocular Movements: Extraocular movements intact.   Cardiovascular:     Rate and Rhythm: Normal rate and regular rhythm.     Heart sounds: Normal heart sounds. No murmur heard.   No friction rub. No gallop.  Pulmonary:     Effort: Pulmonary effort is normal. No respiratory distress.     Breath sounds: Normal breath sounds. No wheezing, rhonchi or rales.  Abdominal:     General: Bowel sounds are normal. There is no distension.     Palpations: Abdomen is soft.     Tenderness: There is no abdominal tenderness. There is no guarding or rebound.  Musculoskeletal:     Cervical back: Neck supple.     Right lower leg: No edema.     Left lower leg: No edema.  Skin:    General: Skin is warm and dry.     Coloration: Skin is not jaundiced.  Neurological:     Mental Status: He is alert.     Comments: Able to answer questions, however, daughter says he is not as sharp as he usually is    Laboratory Data Recent Labs  Lab 12/11/21 1212  WBC 8.8  HGB 6.4*  HCT 18.2*  PLT 136*   Recent Labs  Lab 12/11/21 1212  NA 134*  K 4.8  CL 107  CO2 17*  BUN 61*  CALCIUM 8.2*  PROT 6.1*  BILITOT 1.7*  ALKPHOS 121  ALT 16  AST 34  GLUCOSE 169*   Recent Labs  Lab 12/11/21 1313  INR 2.3*    No results for input(s): LIPASE in the last 72 hours.      Imaging Studies: No results found.  Assessment:   # Upper GI Bleeding- suspect variceal - started this morning -Last underwent EGD on November 20 with grade 3 varices with stigmata of recent bleeding status post banding.  Also noted to have a duodenal erosion at that time.-Last underwent EGD on November 20 with grade 3 varices with stigmata of recent bleeding status post banding.  Also noted to have a duodenal erosion at that time. 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 -MELD-NA 25 (12/11/21); Child Pugh C - sober for 8 months - encephalopathy, coagulopathy, hypoalbuminemia, hyperbilirubinemia, hyponatremia, esophageal varices, hyperammonemia,  thrombocytopenia   # Portal HTN with sequelae - encephalopathy, coagulopathy, hypoalbuminemia, hyperbilirubinemia, hyponatremia, esophageal varices, hyperammonemia, thrombocytopenia   # Acute encephalopathy  - pt's daughter notes he is having some hallucinations  #  CKD   # acute on chronic anemia 2/2 blood loss  Plan:  Urgent EGD with general anesthesia/intubation due to active vomiting Pt to receive 2 u prbc; covid negative Monitor H&H; transfusion and resuscitation as per primary team Avoid frequent lab draws to prevent lab induced anemia Continue octreotide drip Continue ceftriaxone 1 g every 24 hours IV for total 7 days for SBP prophylaxis Continue Protonix 40 mg IV twice daily at this time Continue home lactulose and Xifaxan Hold DVT prophylaxis.  Avoid NSAIDs Monitor CBC CMP INR daily Administer vitamin K for coagulopathy Paracentesis with studies as needed  Esophagogastroduodenoscopy with possible biopsy, control of bleeding, polypectomy, and interventions as necessary has been discussed with the patient/patient representative. Informed consent was obtained from the patient/patient representative after explaining the indication, nature, and risks of the procedure including but not limited to death, bleeding, perforation, missed neoplasm/lesions, cardiorespiratory compromise, and reaction to medications. Opportunity for questions was given and appropriate answers were provided. Patient/patient representative has verbalized understanding is amenable to undergoing the procedure.  Consent obtained for daughter given patient's confusion  I personally performed the service.  Management of other medical comorbidities as per primary team  Thank you for allowing [place] to participate in this patient's care. Please don't hesitate to call if any questions or concerns arise.   Annamaria Helling, DO [facility]  Portions of the record may have been created with  voice recognition software. Occasional wrong-word or 'sound-a-like' substitutions may have occurred due to the inherent limitations of voice recognition software.  Read the chart carefully and recognize, using context, where substitutions may have occurred.

## 2021-12-11 NOTE — Op Note (Signed)
[facility] Patient Name: Shawn Torres Procedure Date: 12/11/2021 3:41 PM MRN: [id number] Account #: [id number] Date of Birth: 08-03-1966 Admit Type: Inpatient Age: 56 Room: [facility] ENDO ROOM 4 Gender: Male Note Status: Finalized Instrument Name: Michaelle Birks [id number] Procedure:             Upper GI endoscopy Indications:           Hematemesis, Cirrhosis with UGI bleeding suspected                         esophageal varices Providers:             Rueben Bash, DO Referring MD:          Osie Cheeks, MD Medicines:             General Anesthesia Complications:         No immediate complications. Estimated blood loss:                         Minimal. Procedure:             Pre-Anesthesia Assessment:                        - Prior to the procedure, a History and Physical was                         performed, and patient medications and allergies were                         reviewed. The patient is competent. The risks and                         benefits of the procedure and the sedation options and                         risks were discussed with the patient. All questions                         were answered and informed consent was obtained.                         Patient identification and proposed procedure were                         verified by the physician, the nurse, the anesthetist                         and the technician in the endoscopy suite. Mental                         Status Examination: alert but confused. Airway                         Examination: normal oropharyngeal airway and neck                         mobility. Respiratory Examination: clear to  auscultation. CV Examination: RRR, no murmurs, no S3                         or S4. Prophylactic Antibiotics: The patient requires                         prophylactic antibiotics due to a prior history of                         acute GI  bleeding. Prior Anticoagulants: The patient                         has taken no previous anticoagulant or antiplatelet                         agents. ASA Grade Assessment: IV - A patient with                         severe systemic disease that is a constant threat to                         life. After reviewing the risks and benefits, the                         patient was deemed in satisfactory condition to                         undergo the procedure. The anesthesia plan was to use                         general anesthesia. Immediately prior to                         administration of medications, the patient was                         re-assessed for adequacy to receive sedatives. The                         heart rate, respiratory rate, oxygen saturations,                         blood pressure, adequacy of pulmonary ventilation, and                         response to care were monitored throughout the                         procedure. The physical status of the patient was                         re-assessed after the procedure.                        After obtaining informed consent, the endoscope was                         passed under direct vision. Throughout the procedure,  the patient's blood pressure, pulse, and oxygen                         saturations were monitored continuously. The Endoscope                         was introduced through the mouth, and advanced to the                         second part of duodenum. The upper GI endoscopy was                         accomplished without difficulty. The patient tolerated                         the procedure well. Findings:      Red blood was found in the duodenal bulb, in the first portion of the       duodenum and in the second portion of the duodenum. Lavaged and       suctioned- no source of bleeding seen in the duodenum.      Mild portal hypertensive gastropathy was found in the  gastric body.       Lavaged and suctioned red blood away from gastric mucosa. no signs of       active areas of bleeding in the gastric lumen. No erosions or ulcerats.       No gastric varices. No GAVE Estimated blood loss: none.      The Z-line was regular.      Grade II varices were found in the distal esophagus. Four bands were       successfully placed with complete eradication, resulting in deflation of       varices. There was no bleeding at the end of the procedure. Estimated       blood loss was minimal.      Stigmata of recent esophageal varices bleeding including red wale and       nipple signs were appreciated. Bands were placed with proximal       decompression of varices Impression:            - Blood in the duodenal bulb and in the second portion                         of the duodenum.                        - Portal hypertensive gastropathy.                        - Z-line regular.                        - Grade II esophageal varices. Completely eradicated.                         Banded.                        - No specimens collected. Recommendation:        - Return patient to ICU for ongoing care.                        -  Clear liquid diet.                        - Continue present medications.                        - Including octreotide gtt for total 72 hours, 1 gram                         rocephin iv q24 h (for total 17 days); protonix 40 mg                         iv q12h.                        monitor h&h; Transfusion and resuscitation as per                         primary team- goal hgb btwn 7-9.                        - Repeat upper endoscopy in 2 weeks for retreatment.                        - Return to GI office - follows with [facility].                        - The findings and recommendations were discussed with                         the patient's family. Procedure Code(s):     --- Professional ---                        [phone],  Esophagogastroduodenoscopy, flexible,                         transoral; with band ligation of esophageal/gastric                         varices Diagnosis Code(s):     --- Professional ---                        K92.2, Gastrointestinal hemorrhage, unspecified                        K76.6, Portal hypertension                        K31.89, Other diseases of stomach and duodenum                        K74.60, Unspecified cirrhosis of liver                        I85.10, Secondary esophageal varices without bleeding                        K92.0, Hematemesis CPT copyright 2019 [employer]. All rights reserved. The codes documented in this report are preliminary and upon coder review may  be revised to meet current  compliance requirements. Attending Participation:      I personally performed the entire procedure. Volney American, DO Annamaria Helling DO, DO 12/11/2021 4:29:34 PM This report has been signed electronically. Number of Addenda: 0 Note Initiated On: 12/11/2021 3:41 PM Estimated Blood Loss:  Estimated blood loss was minimal.      [facility]

## 2021-12-11 NOTE — H&P (Signed)
History and Physical    Shawn Torres [id number] DOB: [birth year] DOA: 12/11/2021  PCP: Caryl Never, MD   Patient coming from: Home  I have personally briefly reviewed patient's old medical records in [place]  Chief Complaint: " I have been throwing up blood"  Most of the history was obtained from patient's daughter and his son-in-law at the bedside.  HPI: Shawn Torres is a 56 y.o. male with medical history significant for alcoholic liver cirrhosis with portal hypertension who presents to the ER for evaluation of several episodes of hematemesis. According to the patient's daughter he was in his usual state of health 1 day prior to his admission and was seen at [facility] for paracentesis.  They had drained about 6 L of ascitic fluid.  Patient states that he tolerated the procedure and was discharged home in stable condition. Early in the morning on the day of his admission, he had an episode of emesis.  He got up again to use the bathroom and had a syncopal episode.  His daughter states that she had a loud thud and ran into his room to find him unconscious laying on the floor and gagging.  He had another episode of hematemesis which she describes as bright red blood that filled up a small trash can. EMS was called and patient was brought to the emergency room.  He has since had another episode of bleeding in the ER. He denies having any abdominal pain, no fever, no chest pain, no shortness of breath, no chills, no headache, no lower extremity swelling, no urinary symptoms, no hematochezia or passage of melena stools.  He denies NSAID use. Patient had an episode of upper GI bleed in 09/2021 and had an upper endoscopy which revealed grade 3 varices with stigmata of recent bleeding found in the lower third of the esophagus.  Red wale signs were present.  4 bands were successfully placed during that hospitalization. Sodium 134, potassium 4.8, chloride 107, bicarb 17, glucose 169,  BUN 61, creatinine 1.53, calcium 8.2, alkaline phosphatase 121, albumin 2.9, AST 34, ALT 16, total bilirubin 1.7, white count 8.8, hemoglobin 6.4 down from 8.6, hematocrit 18.2, MCV 102, RDW 14.7, platelet count 136, PT 25.1, INR 2.3 Respiratory viral panel is negative Chest x-ray reviewed by me shows evidence of central venous congestion     ED Course: Patient is a 56 year old male with a history of alcoholic liver cirrhosis and evidence of portal hypertension presents to the ER for evaluation of several episodes of hematemesis as well as a witnessed syncopal episode at home He is noted to have a drop in his H&H from 8.6 >> 6.4. Seen by intensivist and stable for admission to stepdown. He will be admitted to the hospital for further evaluation.    Review of Systems: As per HPI otherwise all other systems reviewed and negative.    Past Medical History:  Diagnosis Date   Cirrhosis, alcoholic ([place])     Past Surgical History:  Procedure Laterality Date   ESOPHAGOGASTRODUODENOSCOPY N/A 10/10/2021   Procedure: ESOPHAGOGASTRODUODENOSCOPY (EGD);  Surgeon: [name], MD;  Location: [facility] ENDOSCOPY;  Service: Endoscopy;  Laterality: N/A;     reports that he has never smoked. He has never used smokeless tobacco. He reports current alcohol use. He reports that he does not use drugs.  No Known Allergies  Family History  Family history unknown: Yes      Prior to Admission medications   Medication Sig Start Date End  Date Taking? Authorizing Provider  carvedilol (COREG) 6.25 MG tablet Take by mouth. 11/22/21 12/22/21  [provider]  feeding supplement (ENSURE ENLIVE / ENSURE PLUS) LIQD Take 237 mLs by mouth 3 (three) times daily between meals. 11/03/21   Ezekiel Slocumb, DO  folic acid (FOLVITE) 1 MG tablet Take 1 tablet by mouth daily. 09/03/21 09/03/22  [provider]  furosemide (LASIX) 40 MG tablet Take 1 tablet (40 mg total) by mouth daily. 11/04/21   Ezekiel Slocumb, DO  lactulose (CHRONULAC) 10 GM/15ML solution Take by mouth. 11/22/21 12/22/21  [provider]  Nutritional Supplements (,FEEDING SUPPLEMENT, PROSOURCE PLUS) liquid Take 30 mLs by mouth 2 (two) times daily between meals. 11/03/21   Nicole Kindred A, DO  ondansetron (ZOFRAN-ODT) 4 MG disintegrating tablet Take 1 tablet (4 mg total) by mouth every 8 (eight) hours as needed for nausea or vomiting. 10/16/21   Richarda Osmond, MD  pantoprazole (PROTONIX) 40 MG tablet Take by mouth. 10/16/21   [provider]  rifaximin (XIFAXAN) 550 MG TABS tablet Take by mouth. 11/22/21 12/22/21  [provider]  spironolactone (ALDACTONE) 100 MG tablet Take 1 tablet (100 mg total) by mouth daily. 11/04/21   Ezekiel Slocumb, DO  tamsulosin (FLOMAX) 0.4 MG CAPS capsule Take 1 capsule (0.4 mg total) by mouth daily. 10/17/21   Richarda Osmond, MD  thiamine 100 MG tablet Take 1 tablet (100 mg total) by mouth daily. 10/16/21   Richarda Osmond, MD    Physical Exam: Vitals:   12/11/21 1206 12/11/21 1214 12/11/21 1414  BP: (!) 152/91  102/60  Pulse: 80  70  Resp: 19  16  Temp:  97.9 F (36.6 C) (!) 97.4 F (36.3 C)  TempSrc:  Oral Oral  SpO2: 100%  100%     Vitals:   12/11/21 1206 12/11/21 1214 12/11/21 1414  BP: (!) 152/91  102/60  Pulse: 80  70  Resp: 19  16  Temp:  97.9 F (36.6 C) (!) 97.4 F (36.3 C)  TempSrc:  Oral Oral  SpO2: 100%  100%      Constitutional: Alert and oriented x 3 . Not in any apparent distress HEENT:      Head: Normocephalic and atraumatic.         Eyes: PERLA, EOMI, Conjunctivae pallor. Sclera is non-icteric.       Mouth/Throat: Mucous membranes are moist.       Neck: Supple with no signs of meningismus. Cardiovascular: Regular rate and rhythm. No murmurs, gallops, or rubs. 2+ symmetrical distal pulses are present . No JVD. No LE edema Respiratory: Respiratory effort normal .Lungs sounds clear bilaterally. No wheezes, crackles, or  rhonchi.  Gastrointestinal: Soft, non tender, and non distended with positive bowel sounds.  Genitourinary: No CVA tenderness. Musculoskeletal: Nontender with normal range of motion in all extremities. No cyanosis, or erythema of extremities. Neurologic:  Face is symmetric. Moving all extremities. No gross focal neurologic deficits . Skin: Skin is warm, dry.  No rash or ulcers Psychiatric: Mood and affect are normal    Labs on Admission: I have personally reviewed following labs and imaging studies  CBC: Recent Labs  Lab 12/11/21 1212  WBC 8.8  HGB 6.4*  HCT 18.2*  MCV 102.2*  PLT [id number]*   Basic Metabolic Panel: Recent Labs  Lab 12/11/21 1212  NA 134*  K 4.8  CL 107  CO2 17*  GLUCOSE 169*  BUN 61*  CREATININE 1.53*  CALCIUM 8.2*  GFR: CrCl cannot be calculated (Unknown ideal weight.). Liver Function Tests: Recent Labs  Lab 12/11/21 1212  AST 34  ALT 16  ALKPHOS 121  BILITOT 1.7*  PROT 6.1*  ALBUMIN 2.9*   No results for input(s): LIPASE, AMYLASE in the last 168 hours. No results for input(s): AMMONIA in the last 168 hours. Coagulation Profile: Recent Labs  Lab 12/11/21 1313  INR 2.3*   Cardiac Enzymes: No results for input(s): CKTOTAL, CKMB, CKMBINDEX, TROPONINI in the last 168 hours. BNP (last 3 results) No results for input(s): PROBNP in the last 8760 hours. HbA1C: No results for input(s): HGBA1C in the last 72 hours. CBG: No results for input(s): GLUCAP in the last 168 hours. Lipid Profile: No results for input(s): CHOL, HDL, LDLCALC, TRIG, CHOLHDL, LDLDIRECT in the last 72 hours. Thyroid Function Tests: No results for input(s): TSH, T4TOTAL, FREET4, T3FREE, THYROIDAB in the last 72 hours. Anemia Panel: No results for input(s): VITAMINB12, FOLATE, FERRITIN, TIBC, IRON, RETICCTPCT in the last 72 hours. Urine analysis:    Component Value Date/Time   COLORURINE AMBER (A) 04/28/2021 1736   APPEARANCEUR CLEAR (A) 04/28/2021 1736   LABSPEC 1.012  04/28/2021 1736   PHURINE 6.0 04/28/2021 1736   GLUCOSEU NEGATIVE 04/28/2021 1736   HGBUR MODERATE (A) 04/28/2021 1736   BILIRUBINUR NEGATIVE 04/28/2021 1736   KETONESUR NEGATIVE 04/28/2021 1736   PROTEINUR 30 (A) 04/28/2021 1736   NITRITE NEGATIVE 04/28/2021 1736   LEUKOCYTESUR NEGATIVE 04/28/2021 1736    Radiological Exams on Admission: No results found.   Assessment/Plan Principal Problem:   Acute blood loss anemia (ABLA) Active Problems:   Alcoholic cirrhosis of liver with ascites (HCC)   Type 2 diabetes mellitus, without long-term current use of insulin (HCC)   GERD (gastroesophageal reflux disease)      Patient is a 56 year old male admitted to the hospital for acute blood loss anemia from most likely variceal bleed.    Acute blood loss anemia In a patient with known history of alcoholic liver cirrhosis with portal hypertension and evidence of esophageal varices status post banding in November after an episode of GI bleed. Patient has had a drop in his H&H from 8.6 >> 6.4 Will transfuse patient Check serial H&H Continue octreotide drip initiated in the ER Continue IV PPI Will place patient on IV Rocephin for SBP prophylaxis We will request GI consult     Alcoholic liver cirrhosis Hold spironolactone, carvedilol and Lasix Resume Xifaxan and lactulose once oral intake has been initiated    DVT prophylaxis: SCD  Code Status: full code  Family Communication: Greater than 50% of time was spent discussing patient's condition and plan of care with his daughter and son-in-law at the bedside.  All questions and concerns have been addressed.  They verbalized understanding and agree with the plan. Disposition Plan: Back to previous home environment Consults called: Gastroenterology Status:At the time of admission, it appears that the appropriate admission status for this patient is inpatient. This is judged to be reasonable and necessary to provide the required  intensity of service to ensure the patient's safety given the presenting symptoms, physical exam findings, and initial radiographic and laboratory data in the context of their comorbid conditions. Patient requires inpatient status due to high intensity of service, high risk for further deterioration and high frequency of surveillance required.     Collier Bullock MD [facility]     12/11/2021, 2:18 PM

## 2021-12-11 NOTE — ED Notes (Signed)
Pt actively vomiting blood at this time. Will give reglan.

## 2021-12-11 NOTE — ED Provider Notes (Signed)
[facility] Provider Note    Event Date/Time   First MD Initiated Contact with Patient 12/11/21 1302     (approximate)   History   Hematemesis   HPI  Shawn Torres is a 56 y.o. male who presents to the ED for evaluation of Hematemesis   I reviewed DC summary from 12/24.  History of alcoholism and associated complications of cirrhosis, ascites and hepatic encephalopathy recurrently.  He is well-known to me.  Patient presents to the ED for evaluation of acute hematemesis starting just this morning around 4 AM.  Reports recurrent episodes of hematemesis, feeling weak and looking pale to his daughter.  Denies significant abdominal pain, fever or stool changes.  Physical Exam   Triage Vital Signs: ED Triage Vitals  Enc Vitals Group     BP 12/11/21 1206 (!) 152/91     Pulse Rate 12/11/21 1206 80     Resp 12/11/21 1206 19     Temp 12/11/21 1214 97.9 F (36.6 C)     Temp Source 12/11/21 1214 Oral     SpO2 12/11/21 1206 100 %     Weight --      Height --      Head Circumference --      Peak Flow --      Pain Score 12/11/21 1206 6     Pain Loc --      Pain Edu? --      Excl. in GC? --     Most recent vital signs: Vitals:   12/11/21 1414 12/11/21 1429  BP: 102/60 124/75  Pulse: 70 72  Resp: 16 18  Temp: (!) 97.4 F (36.3 C) 97.7 F (36.5 C)  SpO2: 100% 100%    General: Awake, no distress.  CV:  Good peripheral perfusion. RRR Resp:  Normal effort.  Abd:  Minimal distention.  Nontender. MSK:  No deformity noted.  Neuro:  No focal deficits appreciated. Other:     ED Results / Procedures / Treatments   Labs (all labs ordered are listed, but only abnormal results are displayed) Labs Reviewed  COMPREHENSIVE METABOLIC PANEL - Abnormal; Notable for the following components:      Result Value   Sodium 134 (*)    CO2 17 (*)    Glucose, Bld 169 (*)    BUN 61 (*)    Creatinine, Ser 1.53 (*)    Calcium 8.2 (*)    Total Protein 6.1 (*)     Albumin 2.9 (*)    Total Bilirubin 1.7 (*)    GFR, Estimated 54 (*)    All other components within normal limits  CBC - Abnormal; Notable for the following components:   RBC 1.78 (*)    Hemoglobin 6.4 (*)    HCT 18.2 (*)    MCV 102.2 (*)    MCH 36.0 (*)    Platelets 136 (*)    All other components within normal limits  PROTIME-INR - Abnormal; Notable for the following components:   Prothrombin Time 25.1 (*)    INR 2.3 (*)    All other components within normal limits  APTT - Abnormal; Notable for the following components:   aPTT 40 (*)    All other components within normal limits  RESP PANEL BY RT-PCR (FLU A&B, COVID) ARPGX2  POC OCCULT BLOOD, ED  TYPE AND SCREEN  PREPARE RBC (CROSSMATCH)    EKG    RADIOLOGY   Official radiology report(s): No results found.  PROCEDURES and  INTERVENTIONS:  .1-3 Lead EKG Interpretation Performed by: Delton Prairie, MD Authorized by: Delton Prairie, MD     Interpretation: normal     ECG rate:  70   ECG rate assessment: normal     Rhythm: sinus rhythm     Ectopy: none     Conduction: normal   .Critical Care Performed by: Delton Prairie, MD Authorized by: Delton Prairie, MD   Critical care provider statement:    Critical care time (minutes):  30   Critical care time was exclusive of:  Separately billable procedures and treating other patients   Critical care was necessary to treat or prevent imminent or life-threatening deterioration of the following conditions:  Circulatory failure   Critical care was time spent personally by me on the following activities:  Development of treatment plan with patient or surrogate, discussions with consultants, evaluation of patient's response to treatment, examination of patient, ordering and review of laboratory studies, ordering and review of radiographic studies, ordering and performing treatments and interventions, pulse oximetry, re-evaluation of patient's condition and review of old  charts  Medications  pantoprazole (PROTONIX) 80 mg /NS 100 mL IVPB (has no administration in time range)  pantoprozole (PROTONIX) 80 mg /NS 100 mL infusion (has no administration in time range)  pantoprazole (PROTONIX) injection 40 mg (has no administration in time range)  octreotide (SANDOSTATIN) 2 mcg/mL load via infusion 50 mcg (50 mcg Intravenous Bolus from Bag 12/11/21 1420)    And  octreotide (SANDOSTATIN) 500 mcg in sodium chloride 0.9 % 250 mL (2 mcg/mL) infusion (50 mcg/hr Intravenous New Bag/Given 12/11/21 1420)  0.9 %  sodium chloride infusion (has no administration in time range)  0.9 %  sodium chloride infusion (has no administration in time range)  phytonadione (VITAMIN K) 10 mg in dextrose 5 % 50 mL IVPB (has no administration in time range)  ondansetron (ZOFRAN) injection 4 mg (4 mg Intravenous Given 12/11/21 1216)  cefTRIAXone (ROCEPHIN) 1 g in sodium chloride 0.9 % 100 mL IVPB (0 g Intravenous Stopped 12/11/21 1355)  ondansetron (ZOFRAN) injection 4 mg (4 mg Intravenous Given 12/11/21 1329)  metoCLOPramide (REGLAN) injection 10 mg (10 mg Intravenous Given 12/11/21 1435)     IMPRESSION / MDM / ASSESSMENT AND PLAN / ED COURSE  I reviewed the triage vital signs and the nursing notes.  56 year old male with known esophageal varices presenting with hematemesis concerning for variceal bleeds requiring medical admission and EGD.  He is not tachycardic, and he is hemodynamically stable.  Does have 1 or 2 episodes of hematemesis in the ED, but no significant clinical change.  Hemoglobin has dropped a couple points down to 6.4, alongside his elevated BUN certainly concerning for active bleeding.  Coagulopathy is noted.  Consulted with GI, the ICU and hospitalist.  Ultimately, the plan for urgent EGD today with GI and hospitalist admission.  Provided IV Protonix, Rocephin and octreotide.  Will be admitted to hospitalist  Clinical Course as of 12/11/21 1457  Sat Dec 11, 2021  1310 Discussed  plan of care with patient, daughter and son-in-law.  They are in agreement GI paged [DS]  1327 I consult with Dr. Timothy Lasso, who will come evaluate [DS]  1349 I consult with hospitalist who agrees to see the patient for admission, but then she calls her back and requests ICU to "lay eyes on him" to provide their input to see if patient requires intensivist admission [DS]  1456 I discussed with the ICU who has evaluated patient.  They agree  that he does not require ICU care at this time. [DS]    Clinical Course User Index [DS] Delton Prairie, MD     FINAL CLINICAL IMPRESSION(S) / ED DIAGNOSES   Final diagnoses:  Hematemesis with nausea  Esophageal varices with bleeding in diseases classified elsewhere [facility])     Rx / DC Orders   ED Discharge Orders     None        Note:  This document was prepared using Dragon voice recognition software and may include unintentional dictation errors.   Delton Prairie, MD 12/11/21 [phone]

## 2021-12-11 NOTE — Anesthesia Preprocedure Evaluation (Signed)
Anesthesia Evaluation  Patient identified by MRN, date of birth, ID band Patient awake  General Assessment Comment:  66M hx alcohol abuse and esophageal varices, prior variceal bleeds, presenting with hematemesis. Received 1u PRBC so far in the ED. On my exam, patient not vomiting or retching, but had been in the ED. Patient able to keep a conversation with me and appeared oriented enough to provide his own consent. Hemodynamically stable  Reviewed: Allergy & Precautions, NPO status , Patient's Chart, lab work & pertinent test results  History of Anesthesia Complications Negative for: history of anesthetic complications  Airway Mallampati: III  TM Distance: >3 FB Neck ROM: Full   Comment: Oropharynx appears dry, no signs of blood Dental no notable dental hx. (+) Teeth Intact   Pulmonary neg pulmonary ROS, neg sleep apnea, neg COPD, Patient abstained from smoking.Not current smoker,    Pulmonary exam normal breath sounds clear to auscultation       Cardiovascular Exercise Tolerance: Good METS(-) hypertension(-) CAD and (-) Past MI negative cardio ROS  (-) dysrhythmias  Rhythm:Regular Rate:Normal - Systolic murmurs    Neuro/Psych negative neurological ROS  negative psych ROS   GI/Hepatic PUD, neg GERD  ,(+) Cirrhosis   Esophageal Varices  (-) substance abuse  ,   Endo/Other  diabetes  Renal/GU CRFRenal disease     Musculoskeletal   Abdominal   Peds  Hematology  (+) Blood dyscrasia, anemia ,   Anesthesia Other Findings Past Medical History: No date: Cirrhosis, alcoholic (HCC)  Reproductive/Obstetrics                             Anesthesia Physical Anesthesia Plan  ASA: 4 and emergent  Anesthesia Plan: General   Post-op Pain Management: Minimal or no pain anticipated   Induction: Intravenous and Rapid sequence  PONV Risk Score and Plan: 2 and Ondansetron, Dexamethasone and  Midazolam  Airway Management Planned: Oral ETT  Additional Equipment: None  Intra-op Plan:   Post-operative Plan: Extubation in OR and Possible Post-op intubation/ventilation  Informed Consent: I have reviewed the patients History and Physical, chart, labs and discussed the procedure including the risks, benefits and alternatives for the proposed anesthesia with the patient or authorized representative who has indicated his/her understanding and acceptance.     Dental advisory given  Plan Discussed with: CRNA and Surgeon  Anesthesia Plan Comments: (Discussed risks of anesthesia with patient, including PONV, sore throat, lip/dental/eye damage, aspiration. Rare risks discussed as well, such as cardiorespiratory and neurological sequelae, and allergic reactions. Patient counseled on being higher risk for anesthesia due to comorbidities: active hematemesis. Patient was told about increased risk of cardiac and respiratory events, including death. Discussed the role of CRNA in patient's perioperative care. Patient understands. Discussed possible prolonged intubation (patient was kept intubated after prior EGD due to altered mental status and active vomiting immediately preop).)        Anesthesia Quick Evaluation

## 2021-12-11 NOTE — ED Notes (Signed)
Pt with approx 500 cc bloody emesis.

## 2021-12-11 NOTE — H&P (View-Only) (Signed)
Inpatient Consultation   Patient ID: Shawn Torres is a 56 y.o. male.  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 Daughter is at bedside who helps provide translation and history.   Pt noted at 0400 that he developed hematemesis. Had some dark stool yesterday. Upon presentation, hgb 6.4 (from 8.6 on last check in December), BUN Cr 61/1.53; He did have active emesis in the ed with blood. Pt feels his mentation isn't as sharp as it usually is but he has been keeping up with his lactulose and his diuretics.  He denies abdominal pain. No alcohol use. No fevers/chills. Vital signs have been stable. Protonix, octreotide, and abx started in ED. He is scheduled to receive 2 u prbc  Daughter- Clarita Crane [phone]  Denies NSAIDs, Anti-plt agents, and anticoagulants Denies family history of gastrointestinal disease and malignancy Previous Endoscopies: multiple EGD 09/2021-grade 3 esophageal varices with stigmata of recent bleeding status post EVL   Past Medical History:  Diagnosis Date   Cirrhosis, alcoholic ([place])     Past Surgical History:  Procedure Laterality Date   ESOPHAGOGASTRODUODENOSCOPY N/A 10/10/2021   Procedure: ESOPHAGOGASTRODUODENOSCOPY (EGD);  Surgeon: [name], MD;  Location: [facility];  Service: Endoscopy;  Laterality: N/A;    No Known Allergies  Family History  Family history unknown: Yes    Social History   Tobacco Use   Smoking status: Never   Smokeless tobacco: Never  Vaping Use   Vaping Use: Never used  Substance Use Topics   Alcohol use: Yes   Drug use: Never     Pertinent GI related history and allergies were  reviewed with the patient  Review of Systems  Constitutional:  Negative for activity change, appetite change, chills, diaphoresis, fatigue, fever and unexpected weight change.  HENT:  Negative for trouble swallowing and voice change.   Respiratory:  Negative for shortness of breath and wheezing.   Cardiovascular:  Negative for chest pain, palpitations and leg swelling.  Gastrointestinal:  Positive for blood in stool (melena) and vomiting (hemetemesis). Negative for abdominal distention, abdominal pain, anal bleeding, constipation, diarrhea and nausea.  Musculoskeletal:  Negative for arthralgias and myalgias.  Skin:  Negative for color change and pallor.  Neurological:  Negative for dizziness, syncope and weakness.  Psychiatric/Behavioral:  Positive for confusion and decreased concentration. The patient is not nervous/anxious.   All other systems reviewed and are negative.   Medications Home Medications No current facility-administered medications on file prior to encounter.   Current Outpatient Medications on File Prior to Encounter  Medication Sig Dispense Refill   carvedilol (COREG) 6.25 MG tablet Take by mouth.     folic acid (FOLVITE) 1 MG tablet Take 1 tablet by mouth daily.     furosemide (LASIX) 40 MG tablet Take 1 tablet (40 mg total) by mouth daily. 30 tablet 2   lactulose (CHRONULAC) 10 GM/15ML solution Take by mouth.     spironolactone (ALDACTONE) 100 MG tablet Take 1 tablet (100 mg total) by mouth daily. 30 tablet 2   tamsulosin (FLOMAX) 0.4 MG CAPS capsule Take 1 capsule (0.4 mg total) by mouth daily. 30 capsule 0   thiamine 100 MG tablet Take 1 tablet (100 mg total) by  mouth daily. 30 tablet 0   feeding supplement (ENSURE ENLIVE / ENSURE PLUS) LIQD Take 237 mLs by mouth 3 (three) times daily between meals. 237 mL 12   Nutritional Supplements (,FEEDING SUPPLEMENT, PROSOURCE PLUS) liquid Take 30 mLs by mouth 2 (two) times daily between meals. 887 mL 1   ondansetron  (ZOFRAN-ODT) 4 MG disintegrating tablet Take 1 tablet (4 mg total) by mouth every 8 (eight) hours as needed for nausea or vomiting. (Patient not taking: Reported on 12/11/2021) 20 tablet 0   pantoprazole (PROTONIX) 40 MG tablet Take by mouth. (Patient not taking: Reported on 12/11/2021)     rifaximin (XIFAXAN) 550 MG TABS tablet Take by mouth. (Patient not taking: Reported on 12/11/2021)     Pertinent GI related medications were reviewed with the patient  Inpatient Medications  Current Facility-Administered Medications:    0.9 %  sodium chloride infusion, 10 mL/hr, Intravenous, Once, Vladimir Crofts, MD   0.9 %  sodium chloride infusion, , Intravenous, Continuous, Annamaria Helling, DO   [COMPLETED] octreotide (SANDOSTATIN) 2 mcg/mL load via infusion 50 mcg, 50 mcg, Intravenous, Once, 50 mcg at 12/11/21 1420 **AND** octreotide (SANDOSTATIN) 500 mcg in sodium chloride 0.9 % 250 mL (2 mcg/mL) infusion, 50 mcg/hr, Intravenous, Continuous, Vladimir Crofts, MD, Last Rate: 25 mL/hr at 12/11/21 1420, 50 mcg/hr at 12/11/21 1420   pantoprazole (PROTONIX) 80 mg /NS 100 mL IVPB, 80 mg, Intravenous, Once, Vladimir Crofts, MD   Derrill Memo ON 12/15/2021] pantoprazole (PROTONIX) injection 40 mg, 40 mg, Intravenous, Q12H, Vladimir Crofts, MD   pantoprozole (PROTONIX) 80 mg /NS 100 mL infusion, 8 mg/hr, Intravenous, Continuous, Vladimir Crofts, MD   phytonadione (VITAMIN K) 10 mg in dextrose 5 % 50 mL IVPB, 10 mg, Intravenous, Once, Annamaria Helling, DO  Current Outpatient Medications:    carvedilol (COREG) 6.25 MG tablet, Take by mouth., Disp: , Rfl:    folic acid (FOLVITE) 1 MG tablet, Take 1 tablet by mouth daily., Disp: , Rfl:    furosemide (LASIX) 40 MG tablet, Take 1 tablet (40 mg total) by mouth daily., Disp: 30 tablet, Rfl: 2   lactulose (CHRONULAC) 10 GM/15ML solution, Take by mouth., Disp: , Rfl:    spironolactone (ALDACTONE) 100 MG tablet, Take 1 tablet (100 mg total) by mouth daily., Disp: 30 tablet, Rfl: 2    tamsulosin (FLOMAX) 0.4 MG CAPS capsule, Take 1 capsule (0.4 mg total) by mouth daily., Disp: 30 capsule, Rfl: 0   thiamine 100 MG tablet, Take 1 tablet (100 mg total) by mouth daily., Disp: 30 tablet, Rfl: 0   feeding supplement (ENSURE ENLIVE / ENSURE PLUS) LIQD, Take 237 mLs by mouth 3 (three) times daily between meals., Disp: 237 mL, Rfl: 12   Nutritional Supplements (,FEEDING SUPPLEMENT, PROSOURCE PLUS) liquid, Take 30 mLs by mouth 2 (two) times daily between meals., Disp: 887 mL, Rfl: 1   ondansetron (ZOFRAN-ODT) 4 MG disintegrating tablet, Take 1 tablet (4 mg total) by mouth every 8 (eight) hours as needed for nausea or vomiting. (Patient not taking: Reported on 12/11/2021), Disp: 20 tablet, Rfl: 0   pantoprazole (PROTONIX) 40 MG tablet, Take by mouth. (Patient not taking: Reported on 12/11/2021), Disp: , Rfl:    rifaximin (XIFAXAN) 550 MG TABS tablet, Take by mouth. (Patient not taking: Reported on 12/11/2021), Disp: , Rfl:   sodium chloride     sodium chloride     octreotide  (SANDOSTATIN)    IV infusion 50 mcg/hr (12/11/21 1420)   pantoprazole     pantoprazole  phytonadione (VITAMIN K) IV         Objective   Vitals:   12/11/21 1206 12/11/21 1214 12/11/21 1414 12/11/21 1429  BP: (!) 152/91  102/60 124/75  Pulse: 80  70 72  Resp: 19  16 18   Temp:  97.9 F (36.6 C) (!) 97.4 F (36.3 C) 97.7 F (36.5 C)  TempSrc:  Oral Oral   SpO2: 100%  100% 100%     Physical Exam Vitals and nursing note reviewed.  Constitutional:      General: He is in acute distress (mild).     Appearance: He is ill-appearing (acutely and chronically). He is not toxic-appearing or diaphoretic.     Comments: Shirt stained with dark emesis  HENT:     Head: Normocephalic and atraumatic.     Nose: Nose normal.     Mouth/Throat:     Mouth: Mucous membranes are dry.     Pharynx: Oropharynx is clear.  Eyes:     General: No scleral icterus.    Extraocular Movements: Extraocular movements intact.   Cardiovascular:     Rate and Rhythm: Normal rate and regular rhythm.     Heart sounds: Normal heart sounds. No murmur heard.   No friction rub. No gallop.  Pulmonary:     Effort: Pulmonary effort is normal. No respiratory distress.     Breath sounds: Normal breath sounds. No wheezing, rhonchi or rales.  Abdominal:     General: Bowel sounds are normal. There is no distension.     Palpations: Abdomen is soft.     Tenderness: There is no abdominal tenderness. There is no guarding or rebound.  Musculoskeletal:     Cervical back: Neck supple.     Right lower leg: No edema.     Left lower leg: No edema.  Skin:    General: Skin is warm and dry.     Coloration: Skin is not jaundiced.  Neurological:     Mental Status: He is alert.     Comments: Able to answer questions, however, daughter says he is not as sharp as he usually is    Laboratory Data Recent Labs  Lab 12/11/21 1212  WBC 8.8  HGB 6.4*  HCT 18.2*  PLT 136*   Recent Labs  Lab 12/11/21 1212  NA 134*  K 4.8  CL 107  CO2 17*  BUN 61*  CALCIUM 8.2*  PROT 6.1*  BILITOT 1.7*  ALKPHOS 121  ALT 16  AST 34  GLUCOSE 169*   Recent Labs  Lab 12/11/21 1313  INR 2.3*    No results for input(s): LIPASE in the last 72 hours.      Imaging Studies: No results found.  Assessment:   # Upper GI Bleeding- suspect variceal - started this morning -Last underwent EGD on November 20 with grade 3 varices with stigmata of recent bleeding status post banding.  Also noted to have a duodenal erosion at that time.-Last underwent EGD on November 20 with grade 3 varices with stigmata of recent bleeding status post banding.  Also noted to have a duodenal erosion at that time. 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 -MELD-NA 25 (12/11/21); Child Pugh C - sober for 8 months - encephalopathy, coagulopathy, hypoalbuminemia, hyperbilirubinemia, hyponatremia, esophageal varices, hyperammonemia,  thrombocytopenia   # Portal HTN with sequelae - encephalopathy, coagulopathy, hypoalbuminemia, hyperbilirubinemia, hyponatremia, esophageal varices, hyperammonemia, thrombocytopenia   # Acute encephalopathy  - pt's daughter notes he is having some hallucinations  #  CKD   # acute on chronic anemia 2/2 blood loss  Plan:  Urgent EGD with general anesthesia/intubation due to active vomiting Pt to receive 2 u prbc; covid negative Monitor H&H; transfusion and resuscitation as per primary team Avoid frequent lab draws to prevent lab induced anemia Continue octreotide drip Continue ceftriaxone 1 g every 24 hours IV for total 7 days for SBP prophylaxis Continue Protonix 40 mg IV twice daily at this time Continue home lactulose and Xifaxan Hold DVT prophylaxis.  Avoid NSAIDs Monitor CBC CMP INR daily Administer vitamin K for coagulopathy Paracentesis with studies as needed  Esophagogastroduodenoscopy with possible biopsy, control of bleeding, polypectomy, and interventions as necessary has been discussed with the patient/patient representative. Informed consent was obtained from the patient/patient representative after explaining the indication, nature, and risks of the procedure including but not limited to death, bleeding, perforation, missed neoplasm/lesions, cardiorespiratory compromise, and reaction to medications. Opportunity for questions was given and appropriate answers were provided. Patient/patient representative has verbalized understanding is amenable to undergoing the procedure.  Consent obtained for daughter given patient's confusion  I personally performed the service.  Management of other medical comorbidities as per primary team  Thank you for allowing [place] to participate in this patient's care. Please don't hesitate to call if any questions or concerns arise.   Annamaria Helling, DO [facility]  Portions of the record may have been created with  voice recognition software. Occasional wrong-word or 'sound-a-like' substitutions may have occurred due to the inherent limitations of voice recognition software.  Read the chart carefully and recognize, using context, where substitutions may have occurred.

## 2021-12-12 ENCOUNTER — Encounter: Payer: Self-pay | Admitting: Internal Medicine

## 2021-12-12 DIAGNOSIS — K7031 Alcoholic cirrhosis of liver with ascites: Principal | ICD-10-CM

## 2021-12-12 LAB — BASIC METABOLIC PANEL
Anion gap: 13 (ref 5–15)
BUN: 73 mg/dL — ABNORMAL HIGH (ref 6–20)
CO2: 14 mmol/L — ABNORMAL LOW (ref 22–32)
Calcium: 8.1 mg/dL — ABNORMAL LOW (ref 8.9–10.3)
Chloride: 109 mmol/L (ref 98–111)
Creatinine, Ser: 1.86 mg/dL — ABNORMAL HIGH (ref 0.61–1.24)
GFR, Estimated: 43 mL/min — ABNORMAL LOW (ref >60)
Glucose, Bld: 180 mg/dL — ABNORMAL HIGH (ref 70–99)
Potassium: 4.9 mmol/L (ref 3.5–5.1)
Sodium: 136 mmol/L (ref 135–145)

## 2021-12-12 LAB — HEMOGLOBIN AND HEMATOCRIT, BLOOD
HCT: 20.5 % — ABNORMAL LOW (ref 39.0–52.0)
HCT: 21.5 % — ABNORMAL LOW (ref 39.0–52.0)
HCT: 23.1 % — ABNORMAL LOW (ref 39.0–52.0)
HCT: 23.1 % — ABNORMAL LOW (ref 39.0–52.0)
Hemoglobin: 7.2 g/dL — ABNORMAL LOW (ref 13.0–17.0)
Hemoglobin: 7.8 g/dL — ABNORMAL LOW (ref 13.0–17.0)
Hemoglobin: 7.9 g/dL — ABNORMAL LOW (ref 13.0–17.0)
Hemoglobin: 8.3 g/dL — ABNORMAL LOW (ref 13.0–17.0)

## 2021-12-12 LAB — GLUCOSE, CAPILLARY
Glucose-Capillary: 202 mg/dL — ABNORMAL HIGH (ref 70–99)
Glucose-Capillary: 213 mg/dL — ABNORMAL HIGH (ref 70–99)

## 2021-12-12 MED ORDER — INSULIN ASPART 100 UNIT/ML IJ SOLN
0.0000 [IU] | Freq: Three times a day (TID) | INTRAMUSCULAR | Status: DC
  Administered 2021-12-12 (×2): 2 [IU] via SUBCUTANEOUS
  Administered 2021-12-13: 1 [IU] via SUBCUTANEOUS
  Administered 2021-12-13: 2 [IU] via SUBCUTANEOUS
  Administered 2021-12-13: 4 [IU] via SUBCUTANEOUS
  Administered 2021-12-14: 13:00:00 1 [IU] via SUBCUTANEOUS
  Administered 2021-12-14: 09:00:00 2 [IU] via SUBCUTANEOUS
  Filled 2021-12-12 (×7): qty 1

## 2021-12-12 MED ORDER — LACTATED RINGERS IV SOLN: INTRAVENOUS | Status: AC

## 2021-12-12 MED ORDER — VITAMIN K1 10 MG/ML IJ SOLN
10.0000 mg | Freq: Once | INTRAVENOUS | Status: AC
  Administered 2021-12-12: 10 mg via INTRAVENOUS
  Filled 2021-12-12: qty 1

## 2021-12-12 MED ORDER — INSULIN ASPART 100 UNIT/ML IJ SOLN: 0.0000 [IU] | Freq: Three times a day (TID) | INTRAMUSCULAR | Status: DC

## 2021-12-12 MED ORDER — CHLORHEXIDINE GLUCONATE CLOTH 2 % EX PADS
6.0000 | MEDICATED_PAD | Freq: Every day | CUTANEOUS | Status: DC
  Administered 2021-12-13: 6 via TOPICAL

## 2021-12-12 MED ORDER — PANTOPRAZOLE SODIUM 40 MG IV SOLR
40.0000 mg | Freq: Two times a day (BID) | INTRAVENOUS | Status: DC
  Administered 2021-12-12 – 2021-12-14 (×5): 40 mg via INTRAVENOUS
  Filled 2021-12-12 (×5): qty 40

## 2021-12-12 NOTE — Plan of Care (Signed)
Continuing with plan of care. 

## 2021-12-12 NOTE — Progress Notes (Signed)
Inpatient Follow-up/Progress Note   Patient ID: Shawn Torres is a 56 y.o. male.  Overnight Events / Subjective Findings Information garnered via medical interpreter with patient. NAEON post EGD with EVL. Pt now feeling hungry. No further nausea/vomiting. Denies abdominal pain. No other acute GI complaints.  Review of Systems  Constitutional:  Negative for activity change, appetite change, chills, diaphoresis, fatigue, fever and unexpected weight change.  HENT:  Negative for trouble swallowing and voice change.   Respiratory:  Negative for shortness of breath and wheezing.   Cardiovascular:  Negative for chest pain, palpitations and leg swelling.  Gastrointestinal:  Negative for abdominal distention, abdominal pain, anal bleeding, blood in stool, constipation, diarrhea, nausea and vomiting.  Musculoskeletal:  Negative for arthralgias and myalgias.  Skin:  Negative for color change and pallor.  Neurological:  Negative for dizziness, syncope and weakness.  Psychiatric/Behavioral:  Negative for confusion. The patient is not nervous/anxious.   All other systems reviewed and are negative.   Medications  Current Facility-Administered Medications:    cefTRIAXone (ROCEPHIN) 2 g in sodium chloride 0.9 % 100 mL IVPB, 2 g, Intravenous, Q24H, Jennye Boroughs, MD   Chlorhexidine Gluconate Cloth 2 % PADS 6 each, 6 each, Topical, Daily, Jennye Boroughs, MD   feeding supplement (ENSURE ENLIVE / ENSURE PLUS) liquid 237 mL, 237 mL, Oral, TID BM, Jennye Boroughs, MD   folic acid (FOLVITE) tablet 1 mg, 1 mg, Oral, Daily, Jennye Boroughs, MD, 1 mg at 12/12/21 [id number]   lactated ringers infusion, , Intravenous, Continuous, Jennye Boroughs, MD, Last Rate: 75 mL/hr at 12/12/21 1115, Infusion Verify at 12/12/21 1115   [COMPLETED] octreotide (SANDOSTATIN) 2 mcg/mL load via infusion 50 mcg, 50 mcg, Intravenous, Once, 50 mcg at 12/11/21 1420 **AND** octreotide (SANDOSTATIN) 500 mcg in sodium chloride 0.9 % 250 mL (2  mcg/mL) infusion, 50 mcg/hr, Intravenous, Continuous, Jennye Boroughs, MD, Last Rate: 25 mL/hr at 12/12/21 1115, 50 mcg/hr at 12/12/21 1115   ondansetron (ZOFRAN) tablet 4 mg, 4 mg, Oral, Q6H PRN **OR** ondansetron (ZOFRAN) injection 4 mg, 4 mg, Intravenous, Q6H PRN, Jennye Boroughs, MD   pantoprazole (PROTONIX) injection 40 mg, 40 mg, Intravenous, Q12H, Annamaria Helling, DO   pantoprozole (PROTONIX) 80 mg /NS 100 mL infusion, 8 mg/hr, Intravenous, Continuous, Jennye Boroughs, MD, Last Rate: 10 mL/hr at 12/12/21 1115, 8 mg/hr at 12/12/21 1115   phytonadione (VITAMIN K) 10 mg in dextrose 5 % 50 mL IVPB, 10 mg, Intravenous, Once, Annamaria Helling, DO, Last Rate: 50 mL/hr at 12/12/21 1115, Infusion Verify at 12/12/21 1115   rifaximin (XIFAXAN) tablet 550 mg, 550 mg, Oral, BID, Jennye Boroughs, MD   tamsulosin [facility]) capsule 0.4 mg, 0.4 mg, Oral, Daily, Jennye Boroughs, MD, 0.4 mg at 12/12/21 0911   thiamine tablet 100 mg, 100 mg, Oral, Daily, Jennye Boroughs, MD, 100 mg at 12/12/21 0911  cefTRIAXone (ROCEPHIN)  IV     lactated ringers 75 mL/hr at 12/12/21 1115   octreotide  (SANDOSTATIN)    IV infusion 50 mcg/hr (12/12/21 1115)   pantoprazole 8 mg/hr (12/12/21 1115)   phytonadione (VITAMIN K) IV 50 mL/hr at 12/12/21 1115    ondansetron **OR** ondansetron (ZOFRAN) IV   Objective    Vitals:   12/12/21 0700 12/12/21 0800 12/12/21 0900 12/12/21 1000  BP: 123/70 121/69 117/71 122/73  Pulse: 63 65 66 63  Resp: 17 16 13 12   Temp:  97.8 F (36.6 C)    TempSrc:  Oral    SpO2: 100% 100% 100% 100%  Weight:      Height:        Physical Exam Vitals and nursing note reviewed.  Constitutional:      General: He is not in acute distress.    Appearance: He is ill-appearing (chronically). He is not toxic-appearing or diaphoretic.  HENT:     Head: Normocephalic and atraumatic.     Nose: Nose normal.     Mouth/Throat:     Mouth: Mucous membranes are moist.     Pharynx: Oropharynx is clear.   Eyes:     General: No scleral icterus.    Extraocular Movements: Extraocular movements intact.  Cardiovascular:     Rate and Rhythm: Normal rate and regular rhythm.     Heart sounds: Normal heart sounds. No murmur heard.   No friction rub. No gallop.  Pulmonary:     Effort: Pulmonary effort is normal. No respiratory distress.     Breath sounds: Normal breath sounds. No wheezing, rhonchi or rales.  Abdominal:     General: Bowel sounds are normal. There is no distension.     Palpations: Abdomen is soft.     Tenderness: There is no abdominal tenderness. There is no guarding or rebound.  Musculoskeletal:     Cervical back: Neck supple.     Right lower leg: No edema.     Left lower leg: No edema.  Skin:    General: Skin is warm and dry.     Coloration: Skin is not jaundiced or pale.  Neurological:     Mental Status: He is alert and oriented to person, place, and time.     Comments: Slight asterixis. Answers questions appropriately  Psychiatric:        Mood and Affect: Mood normal.        Behavior: Behavior normal.        Thought Content: Thought content normal.        Judgment: Judgment normal.     Laboratory Data Recent Labs  Lab 12/11/21 1212 12/11/21 2043 12/11/21 2337 12/12/21 0554  WBC 8.8  --   --   --   HGB 6.4* 8.6* 8.3* 7.8*  HCT 18.2* 24.1* 23.1* 21.5*  PLT 136*  --   --   --    Recent Labs  Lab 12/11/21 1212 12/12/21 0554  NA 134* 136  K 4.8 4.9  CL 107 109  CO2 17* 14*  BUN 61* 73*  CREATININE 1.53* 1.86*  CALCIUM 8.2* 8.1*  PROT 6.1*  --   BILITOT 1.7*  --   ALKPHOS 121  --   ALT 16  --   AST 34  --   GLUCOSE 169* 180*   Recent Labs  Lab 12/11/21 1313  INR 2.3*      Imaging Studies: No results found.  Assessment:  # Upper GI/Variceal bleeding - EGD on 12/11/21 with EVL x4 with hemostasis - hgb equilibrating at 7.8 - on octreotide and protonix gtt - on ceftriaxone -Last underwent EGD on November 20 with grade 3 varices with  stigmata of recent bleeding status post banding.  Also noted to have a duodenal erosion at that time.-Last underwent EGD on November 20 with grade 3 varices with stigmata of recent bleeding status post banding.  Also noted to have a duodenal erosion at that time. 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 Rcd 2 u prbc Monitor H&H; transfusion and resuscitation as per primary team Avoid frequent lab draws to prevent lab induced anemia Continue octreotide drip for total 72 hoiurs Continue ceftriaxone 1 g every 24 hours IV for total 7 days for SBP prophylaxis. If discharged prior- can start ciprofloxacin 500mg  twice daily to complete course Transition to Protonix 40 mg IV bid- transition to twice daily po upon discharge Repeat EGD in 2-3 weeks for continued variceal eradication Resume home medications- lactulose and xifaxin Hold dvt ppx and avoid nsaids Monitor CBC CMP INR daily Paracentesis with studies as needed OK for full liquids today. Advance as tolerated tomorrow.  I personally performed the service.  Management of other medical comorbidities as per primary team  Thank you for allowing [place] to participate in this patient's care. Please don't hesitate to call if any questions or concerns arise.   Annamaria Helling, DO [facility]  Gastroenterology  Portions of the record may have been created with voice recognition software. Occasional wrong-word or 'sound-a-like' substitutions may have occurred due to the inherent limitations of voice recognition software.  Read the chart carefully and recognize, using context, where substitutions may have occurred.

## 2021-12-12 NOTE — Progress Notes (Addendum)
Progress Note    Shawn Torres  [id number] DOB: 23-Sep-1966  DOA: 12/11/2021 PCP: Caryl Never, MD      Brief Narrative:    Medical records reviewed and are as summarized below:  Shawn Torres is a 56 y.o. male with medical history significant for alcoholic liver cirrhosis, portal hypertension, ascites with removal of 6 L of fluid on 12/10/2021, who presented to the hospital because of hematemesis.      Assessment/Plan:   Principal Problem:   Acute blood loss anemia (ABLA) Active Problems:   Alcoholic cirrhosis of liver with ascites (HCC)   Type 2 diabetes mellitus, without long-term current use of insulin (HCC)   GERD (gastroesophageal reflux disease)   Body mass index is 28.62 kg/m.   Acute upper GI bleeding: S/p EGD which showed portal hypertensive gastropathy, grade 2 esophageal varices that were completely eradicated and banded.  Continue IV ceftriaxone, IV octreotide drip and IV Protonix.  Plan to discharge on oral ciprofloxacin.  Alcoholic liver cirrhosis with esophageal varices: Continue rifaximin  Acute blood loss anemia: H&H stable.  S/p transfusion with 4 units of packed red blood cells.  AKI on CKD stage IIIa with non-anion gap metabolic acidosis: Treat with IV Ringer's lactate infusion.  Repeat BMP tomorrow.  Type II DM with hyperglycemia: Use NovoLog as needed for hyperglycemia.  Alcohol use disorder: Continue thiamine and multivitamins  Diet Order             Diet full liquid Room service appropriate? Yes; Fluid consistency: Thin  Diet effective now                      Consultants: Gastroenterologist  Procedures: EGD    Medications:    Chlorhexidine Gluconate Cloth  6 each Topical Daily   feeding supplement  237 mL Oral TID BM   folic acid  1 mg Oral Daily   insulin aspart  0-6 Units Subcutaneous TID WC   pantoprazole (PROTONIX) IV  40 mg Intravenous Q12H   rifaximin  550 mg Oral BID   tamsulosin  0.4 mg Oral Daily    thiamine  100 mg Oral Daily   Continuous Infusions:  cefTRIAXone (ROCEPHIN)  IV     lactated ringers 75 mL/hr at 12/12/21 1245   octreotide  (SANDOSTATIN)    IV infusion 50 mcg/hr (12/12/21 1245)     Anti-infectives (From admission, onward)    Start     Dose/Rate Route Frequency Ordered Stop   12/12/21 1300  cefTRIAXone (ROCEPHIN) 2 g in sodium chloride 0.9 % 100 mL IVPB        2 g 200 mL/hr over 30 Minutes Intravenous Every 24 hours 12/11/21 1511     12/12/21 1000  rifaximin (XIFAXAN) tablet 550 mg        550 mg Oral 2 times daily 12/11/21 1510     12/11/21 1315  cefTRIAXone (ROCEPHIN) 1 g in sodium chloride 0.9 % 100 mL IVPB        1 g 200 mL/hr over 30 Minutes Intravenous  Once 12/11/21 1311 12/11/21 1355              Family Communication/Anticipated D/C date and plan/Code Status   DVT prophylaxis: SCDs Start: 12/11/21 1511     Code Status: Full Code  Family Communication: None Disposition Plan: Plan to discharge home in 2 to 3 days   Status is: Inpatient  Remains inpatient appropriate because: IV octreotide and IV antibiotics  Subjective:   Interval events noted.  No abdominal pain, rectal bleeding or hematemesis  Objective:    Vitals:   12/12/21 1000 12/12/21 1100 12/12/21 1211 12/12/21 1306  BP: 122/73  (!) 110/95 123/72  Pulse: 63 63 62 62  Resp: 12 15 (!) 21 18  Temp:   97.6 F (36.4 C) 97.9 F (36.6 C)  TempSrc:   Oral Oral  SpO2: 100% 100% 99% 99%  Weight:      Height:       No data found.   Intake/Output Summary (Last 24 hours) at 12/12/2021 1320 Last data filed at 12/12/2021 1300 Gross per 24 hour  Intake 1865.45 ml  Output 900 ml  Net 965.45 ml   Filed Weights   12/11/21 1708  Weight: 78 kg    Exam:  GEN: NAD SKIN: No rash EYES: EOMI ENT: MMM CV: RRR PULM: CTA B ABD: soft, ND, NT, +BS CNS: AAO x 3, non focal EXT: No edema or tenderness        Data Reviewed:   I have personally reviewed  following labs and imaging studies:  Labs: Labs show the following:   Basic Metabolic Panel: Recent Labs  Lab 12/11/21 1212 12/12/21 0554  NA 134* 136  K 4.8 4.9  CL 107 109  CO2 17* 14*  GLUCOSE 169* 180*  BUN 61* 73*  CREATININE 1.53* 1.86*  CALCIUM 8.2* 8.1*   GFR Estimated Creatinine Clearance: 44.2 mL/min (A) (by C-G formula based on SCr of 1.86 mg/dL (H)). Liver Function Tests: Recent Labs  Lab 12/11/21 1212  AST 34  ALT 16  ALKPHOS 121  BILITOT 1.7*  PROT 6.1*  ALBUMIN 2.9*   No results for input(s): LIPASE, AMYLASE in the last 168 hours. No results for input(s): AMMONIA in the last 168 hours. Coagulation profile Recent Labs  Lab 12/11/21 1313  INR 2.3*    CBC: Recent Labs  Lab 12/11/21 1212 12/11/21 2043 12/11/21 2337 12/12/21 0554  WBC 8.8  --   --   --   HGB 6.4* 8.6* 8.3* 7.8*  HCT 18.2* 24.1* 23.1* 21.5*  MCV 102.2*  --   --   --   PLT 136*  --   --   --    Cardiac Enzymes: No results for input(s): CKTOTAL, CKMB, CKMBINDEX, TROPONINI in the last 168 hours. BNP (last 3 results) No results for input(s): PROBNP in the last 8760 hours. CBG: Recent Labs  Lab 12/12/21 1231  GLUCAP 202*   D-Dimer: No results for input(s): DDIMER in the last 72 hours. Hgb A1c: No results for input(s): HGBA1C in the last 72 hours. Lipid Profile: No results for input(s): CHOL, HDL, LDLCALC, TRIG, CHOLHDL, LDLDIRECT in the last 72 hours. Thyroid function studies: No results for input(s): TSH, T4TOTAL, T3FREE, THYROIDAB in the last 72 hours.  Invalid input(s): FREET3 Anemia work up: No results for input(s): VITAMINB12, FOLATE, FERRITIN, TIBC, IRON, RETICCTPCT in the last 72 hours. Sepsis Labs: Recent Labs  Lab 12/11/21 1212  WBC 8.8    Microbiology Recent Results (from the past 240 hour(s))  Resp Panel by RT-PCR (Flu A&B, Covid) Nasopharyngeal Swab     Status: None   Collection Time: 12/11/21 12:57 PM   Specimen: Nasopharyngeal Swab;  Nasopharyngeal(NP) swabs in vial transport medium  Result Value Ref Range Status   SARS Coronavirus 2 by RT PCR NEGATIVE NEGATIVE Final    Comment: (NOTE) SARS-CoV-2 target nucleic acids are NOT DETECTED.  The SARS-CoV-2 RNA is generally detectable  in upper respiratory specimens during the acute phase of infection. The lowest concentration of SARS-CoV-2 viral copies this assay can detect is 138 copies/mL. A negative result does not preclude SARS-Cov-2 infection and should not be used as the sole basis for treatment or other patient management decisions. A negative result may occur with  improper specimen collection/handling, submission of specimen other than nasopharyngeal swab, presence of viral mutation(s) within the areas targeted by this assay, and inadequate number of viral copies(<138 copies/mL). A negative result must be combined with clinical observations, patient history, and epidemiological information. The expected result is Negative.  Fact Sheet for Patients:  EntrepreneurPulse.com.au  Fact Sheet for Healthcare Providers:  IncredibleEmployment.be  This test is no t yet approved or cleared by the [place] FDA and  has been authorized for detection and/or diagnosis of SARS-CoV-2 by FDA under an Emergency Use Authorization (EUA). This EUA will remain  in effect (meaning this test can be used) for the duration of the COVID-19 declaration under Section 564(b)(1) of the Act, 21 U.S.C.section 360bbb-3(b)(1), unless the authorization is terminated  or revoked sooner.       Influenza A by PCR NEGATIVE NEGATIVE Final   Influenza B by PCR NEGATIVE NEGATIVE Final    Comment: (NOTE) The Xpert Xpress SARS-CoV-2/FLU/RSV plus assay is intended as an aid in the diagnosis of influenza from Nasopharyngeal swab specimens and should not be used as a sole basis for treatment. Nasal washings and aspirates are unacceptable for Xpert Xpress  SARS-CoV-2/FLU/RSV testing.  Fact Sheet for Patients: EntrepreneurPulse.com.au  Fact Sheet for Healthcare Providers: IncredibleEmployment.be  This test is not yet approved or cleared by the [place] FDA and has been authorized for detection and/or diagnosis of SARS-CoV-2 by FDA under an Emergency Use Authorization (EUA). This EUA will remain in effect (meaning this test can be used) for the duration of the COVID-19 declaration under Section 564(b)(1) of the Act, 21 U.S.C. section 360bbb-3(b)(1), unless the authorization is terminated or revoked.  Performed at [facility], [address] [place], [place] [address]   MRSA Next Gen by PCR, Nasal     Status: None   Collection Time: 12/11/21  5:25 PM   Specimen: Nasal Mucosa; Nasal Swab  Result Value Ref Range Status   MRSA by PCR Next Gen NOT DETECTED NOT DETECTED Final    Comment: (NOTE) The GeneXpert MRSA Assay (FDA approved for NASAL specimens only), is one component of a comprehensive MRSA colonization surveillance program. It is not intended to diagnose MRSA infection nor to guide or monitor treatment for MRSA infections. Test performance is not FDA approved in patients less than 38 years old. Performed at [facility], [address] [place], [place] [address]     Procedures and diagnostic studies:  No results found.             LOS: 1 day   Byron Peacock  [facility]   Pager on [email or web address]. If 7PM-7AM, please contact night-coverage at [email or web address]     12/12/2021, 1:20 PM

## 2021-12-12 NOTE — Progress Notes (Signed)
Report given to receiving nurse, Ethelle Lyon, on [place]; patient is in stable condition and transferring via bed.

## 2021-12-12 NOTE — Anesthesia Postprocedure Evaluation (Signed)
Anesthesia Post Note  Patient: Shawn Torres  Procedure(s) Performed: ESOPHAGOGASTRODUODENOSCOPY (EGD) WITH PROPOFOL  Patient location during evaluation: SICU Anesthesia Type: General Level of consciousness: awake and alert and oriented Pain management: pain level controlled Vital Signs Assessment: post-procedure vital signs reviewed and stable Respiratory status: spontaneous breathing, nonlabored ventilation and respiratory function stable Cardiovascular status: stable Postop Assessment: no apparent nausea or vomiting Anesthetic complications: yes   Encounter Notable Events  Notable Event Outcome Phase Comment  Pseudocholinesterase deficiency Resolved in Lab Intraprocedure 0/4 TOF for around 45 minutes after succinylcholine administration     Last Vitals:  Vitals:   12/12/21 1100 12/12/21 1211  BP:  (!) 110/95  Pulse: 63 62  Resp: 15 (!) 21  Temp:  36.4 C  SpO2: 100% 99%    Last Pain:  Vitals:   12/12/21 1211  TempSrc: Oral  PainSc: 0-No pain                 Lenard Simmer

## 2021-12-13 ENCOUNTER — Encounter: Payer: Self-pay | Admitting: Gastroenterology

## 2021-12-13 DIAGNOSIS — K922 Gastrointestinal hemorrhage, unspecified: Secondary | ICD-10-CM

## 2021-12-13 LAB — CBC WITH DIFFERENTIAL/PLATELET
Abs Immature Granulocytes: 0.12 10*3/uL — ABNORMAL HIGH (ref 0.00–0.07)
Basophils Absolute: 0 10*3/uL (ref 0.0–0.1)
Basophils Relative: 0 %
Eosinophils Absolute: 0 10*3/uL (ref 0.0–0.5)
Eosinophils Relative: 0 %
HCT: 21.8 % — ABNORMAL LOW (ref 39.0–52.0)
Hemoglobin: 7.8 g/dL — ABNORMAL LOW (ref 13.0–17.0)
Immature Granulocytes: 1 %
Lymphocytes Relative: 11 %
Lymphs Abs: 1.5 10*3/uL (ref 0.7–4.0)
MCH: 32.9 pg (ref 26.0–34.0)
MCHC: 35.8 g/dL (ref 30.0–36.0)
MCV: 92 fL (ref 80.0–100.0)
Monocytes Absolute: 0.6 10*3/uL (ref 0.1–1.0)
Monocytes Relative: 4 %
Neutro Abs: 11.7 10*3/uL — ABNORMAL HIGH (ref 1.7–7.7)
Neutrophils Relative %: 84 %
Platelets: 117 10*3/uL — ABNORMAL LOW (ref 150–400)
RBC: 2.37 MIL/uL — ABNORMAL LOW (ref 4.22–5.81)
RDW: 15.9 % — ABNORMAL HIGH (ref 11.5–15.5)
WBC: 13.9 10*3/uL — ABNORMAL HIGH (ref 4.0–10.5)
nRBC: 0 % (ref 0.0–0.2)

## 2021-12-13 LAB — GLUCOSE, CAPILLARY
Glucose-Capillary: 143 mg/dL — ABNORMAL HIGH (ref 70–99)
Glucose-Capillary: 247 mg/dL — ABNORMAL HIGH (ref 70–99)
Glucose-Capillary: 313 mg/dL — ABNORMAL HIGH (ref 70–99)
Glucose-Capillary: 168 mg/dL — ABNORMAL HIGH (ref 70–99)
Glucose-Capillary: 192 mg/dL — ABNORMAL HIGH (ref 70–99)

## 2021-12-13 LAB — PREPARE RBC (CROSSMATCH)

## 2021-12-13 LAB — BASIC METABOLIC PANEL
Anion gap: 6 (ref 5–15)
BUN: 64 mg/dL — ABNORMAL HIGH (ref 6–20)
CO2: 17 mmol/L — ABNORMAL LOW (ref 22–32)
Calcium: 8 mg/dL — ABNORMAL LOW (ref 8.9–10.3)
Chloride: 109 mmol/L (ref 98–111)
Creatinine, Ser: 1.63 mg/dL — ABNORMAL HIGH (ref 0.61–1.24)
GFR, Estimated: 50 mL/min — ABNORMAL LOW (ref >60)
Glucose, Bld: 179 mg/dL — ABNORMAL HIGH (ref 70–99)
Potassium: 5.2 mmol/L — ABNORMAL HIGH (ref 3.5–5.1)
Sodium: 132 mmol/L — ABNORMAL LOW (ref 135–145)

## 2021-12-13 LAB — HEMOGLOBIN AND HEMATOCRIT, BLOOD
HCT: 19.6 % — ABNORMAL LOW (ref 39.0–52.0)
HCT: 19 % — ABNORMAL LOW (ref 39.0–52.0)
Hemoglobin: 7.3 g/dL — ABNORMAL LOW (ref 13.0–17.0)
Hemoglobin: 6.8 g/dL — ABNORMAL LOW (ref 13.0–17.0)

## 2021-12-13 MED ORDER — LACTULOSE 10 GM/15ML PO SOLN
10.0000 g | Freq: Three times a day (TID) | ORAL | Status: DC
  Administered 2021-12-13: 10 g via ORAL
  Filled 2021-12-13: qty 30

## 2021-12-13 MED ORDER — FUROSEMIDE 40 MG PO TABS
40.0000 mg | ORAL_TABLET | Freq: Every day | ORAL | Status: DC
  Administered 2021-12-13 – 2021-12-14 (×2): 40 mg via ORAL
  Filled 2021-12-13 (×2): qty 1

## 2021-12-13 MED ORDER — LACTULOSE 10 GM/15ML PO SOLN
20.0000 g | Freq: Three times a day (TID) | ORAL | Status: DC
  Administered 2021-12-13 – 2021-12-14 (×3): 20 g via ORAL
  Filled 2021-12-13 (×3): qty 30

## 2021-12-13 MED ORDER — SODIUM CHLORIDE 0.9% IV SOLUTION: Freq: Once | INTRAVENOUS | Status: AC

## 2021-12-13 NOTE — Progress Notes (Addendum)
Progress Note    Shawn Torres  [id number] DOB: 03/17/1966  DOA: 12/11/2021 PCP: Caryl Never, MD      Brief Narrative:    Medical records reviewed and are as summarized below:  Shawn Torres is a 56 y.o. male with medical history significant for alcoholic liver cirrhosis, portal hypertension, ascites with removal of 6 L of fluid on 12/10/2021, who presented to the hospital because of hematemesis.      Assessment/Plan:   Principal Problem:   Acute upper GI bleed Active Problems:   Alcoholic cirrhosis of liver with ascites (HCC)   Type 2 diabetes mellitus, without long-term current use of insulin (HCC)   GERD (gastroesophageal reflux disease)   Acute blood loss anemia (ABLA)   Body mass index is 28.62 kg/m.   Acute upper GI bleeding: S/p EGD which showed portal hypertensive gastropathy, grade 2 esophageal varices that were completely eradicated and banded.  Continue IV ceftriaxone, IV octreotide infusion for another day and IV Protonix. Plan to discharge on oral ciprofloxacin.  Alcoholic liver cirrhosis with esophageal varices and ascites: Restart lactulose and Lasix.  Continue rifaximin.  Recent paracentesis prior to admission  Acute blood loss anemia: Hemoglobin is trending down.  Transfuse another unit of packed red blood cells today.  Repeat CBC tomorrow s/p transfusion with 4 units of packed red blood cells.  AKI on CKD stage IIIa with non-anion gap metabolic acidosis: IV fluids has been discontinued.  Repeat BMP tomorrow.  Type II DM with hyperglycemia: Use NovoLog as needed for hyperglycemia.  Check hemoglobin A1c.  Alcohol use disorder: Continue thiamine and multivitamins   Plan discussed with the patient and his daughter at the bedside.  Interpretation service via iPad was used for this encounter.   Diet Order             Diet Carb Modified Fluid consistency: Thin; Room service appropriate? Yes  Diet effective now                       Consultants: Gastroenterologist  Procedures: EGD    Medications:    sodium chloride   Intravenous Once   Chlorhexidine Gluconate Cloth  6 each Topical Daily   feeding supplement  237 mL Oral TID BM   folic acid  1 mg Oral Daily   furosemide  40 mg Oral Daily   insulin aspart  0-6 Units Subcutaneous TID WC   lactulose  20 g Oral TID   pantoprazole (PROTONIX) IV  40 mg Intravenous Q12H   rifaximin  550 mg Oral BID   tamsulosin  0.4 mg Oral Daily   thiamine  100 mg Oral Daily   Continuous Infusions:  cefTRIAXone (ROCEPHIN)  IV 2 g (12/13/21 1151)   octreotide  (SANDOSTATIN)    IV infusion 50 mcg/hr (12/13/21 0753)     Anti-infectives (From admission, onward)    Start     Dose/Rate Route Frequency Ordered Stop   12/12/21 1300  cefTRIAXone (ROCEPHIN) 2 g in sodium chloride 0.9 % 100 mL IVPB        2 g 200 mL/hr over 30 Minutes Intravenous Every 24 hours 12/11/21 1511     12/12/21 1000  rifaximin (XIFAXAN) tablet 550 mg        550 mg Oral 2 times daily 12/11/21 1510     12/11/21 1315  cefTRIAXone (ROCEPHIN) 1 g in sodium chloride 0.9 % 100 mL IVPB        1 g  200 mL/hr over 30 Minutes Intravenous  Once 12/11/21 1311 12/11/21 1355              Family Communication/Anticipated D/C date and plan/Code Status   DVT prophylaxis: SCDs Start: 12/11/21 1511     Code Status: Full Code  Family Communication: None Disposition Plan: Plan to discharge home in 1 to 2 days   Status is: Inpatient  Remains inpatient appropriate because: IV octreotide and IV antibiotics           Subjective:   Interval events noted.  He complains of hunger.  No abdominal pain, hematemesis or rectal bleeding vomiting  Objective:    Vitals:   12/12/21 1606 12/12/21 2337 12/13/21 0527 12/13/21 0806  BP: 113/63 118/70 118/76 131/84  Pulse: 66 65 63 63  Resp: 18 17 18 16   Temp: 98 F (36.7 C) 97.9 F (36.6 C) 98 F (36.7 C) 98.2 F (36.8 C)  TempSrc: Oral Oral     SpO2: 100% 100% 100% 100%  Weight:      Height:       No data found.   Intake/Output Summary (Last 24 hours) at 12/13/2021 1336 Last data filed at 12/13/2021 0753 Gross per 24 hour  Intake 2352.36 ml  Output 1800 ml  Net 552.36 ml   Filed Weights   12/11/21 1708  Weight: 78 kg    Exam:  GEN: NAD SKIN: No rash EYES: EOMI ENT: MMM CV: RRR PULM: CTA B ABD: soft, ND, NT, +BS CNS: AAO x 3, non focal EXT: No edema or tenderness        Data Reviewed:   I have personally reviewed following labs and imaging studies:  Labs: Labs show the following:   Basic Metabolic Panel: Recent Labs  Lab 12/11/21 1212 12/12/21 0554 12/13/21 0550  NA 134* 136 132*  K 4.8 4.9 5.2*  CL 107 109 109  CO2 17* 14* 17*  GLUCOSE 169* 180* 179*  BUN 61* 73* 64*  CREATININE 1.53* 1.86* 1.63*  CALCIUM 8.2* 8.1* 8.0*   GFR Estimated Creatinine Clearance: 50.5 mL/min (A) (by C-G formula based on SCr of 1.63 mg/dL (H)). Liver Function Tests: Recent Labs  Lab 12/11/21 1212  AST 34  ALT 16  ALKPHOS 121  BILITOT 1.7*  PROT 6.1*  ALBUMIN 2.9*   No results for input(s): LIPASE, AMYLASE in the last 168 hours. No results for input(s): AMMONIA in the last 168 hours. Coagulation profile Recent Labs  Lab 12/11/21 1313  INR 2.3*    CBC: Recent Labs  Lab 12/11/21 1212 12/11/21 2043 12/12/21 1217 12/12/21 1940 12/13/21 0133 12/13/21 0550 12/13/21 1204  WBC 8.8  --   --   --   --  13.9*  --   NEUTROABS  --   --   --   --   --  11.7*  --   HGB 6.4*   < > 7.9* 7.2* 7.3* 7.8* 6.8*  HCT 18.2*   < > 23.1* 20.5* 19.6* 21.8* 19.0*  MCV 102.2*  --   --   --   --  92.0  --   PLT 136*  --   --   --   --  117*  --    < > = values in this interval not displayed.   Cardiac Enzymes: No results for input(s): CKTOTAL, CKMB, CKMBINDEX, TROPONINI in the last 168 hours. BNP (last 3 results) No results for input(s): PROBNP in the last 8760 hours. CBG: Recent Labs  Lab 12/11/21 1651  12/12/21 1231 12/12/21 2331 12/13/21 0804 12/13/21 1134  GLUCAP 143* 202* 213* 247* 313*   D-Dimer: No results for input(s): DDIMER in the last 72 hours. Hgb A1c: No results for input(s): HGBA1C in the last 72 hours. Lipid Profile: No results for input(s): CHOL, HDL, LDLCALC, TRIG, CHOLHDL, LDLDIRECT in the last 72 hours. Thyroid function studies: No results for input(s): TSH, T4TOTAL, T3FREE, THYROIDAB in the last 72 hours.  Invalid input(s): FREET3 Anemia work up: No results for input(s): VITAMINB12, FOLATE, FERRITIN, TIBC, IRON, RETICCTPCT in the last 72 hours. Sepsis Labs: Recent Labs  Lab 12/11/21 1212 12/13/21 0550  WBC 8.8 13.9*    Microbiology Recent Results (from the past 240 hour(s))  Resp Panel by RT-PCR (Flu A&B, Covid) Nasopharyngeal Swab     Status: None   Collection Time: 12/11/21 12:57 PM   Specimen: Nasopharyngeal Swab; Nasopharyngeal(NP) swabs in vial transport medium  Result Value Ref Range Status   SARS Coronavirus 2 by RT PCR NEGATIVE NEGATIVE Final    Comment: (NOTE) SARS-CoV-2 target nucleic acids are NOT DETECTED.  The SARS-CoV-2 RNA is generally detectable in upper respiratory specimens during the acute phase of infection. The lowest concentration of SARS-CoV-2 viral copies this assay can detect is 138 copies/mL. A negative result does not preclude SARS-Cov-2 infection and should not be used as the sole basis for treatment or other patient management decisions. A negative result may occur with  improper specimen collection/handling, submission of specimen other than nasopharyngeal swab, presence of viral mutation(s) within the areas targeted by this assay, and inadequate number of viral copies(<138 copies/mL). A negative result must be combined with clinical observations, patient history, and epidemiological information. The expected result is Negative.  Fact Sheet for Patients:  BloggerCourse.com  Fact Sheet for  Healthcare Providers:  SeriousBroker.it  This test is no t yet approved or cleared by the [place] FDA and  has been authorized for detection and/or diagnosis of SARS-CoV-2 by FDA under an Emergency Use Authorization (EUA). This EUA will remain  in effect (meaning this test can be used) for the duration of the COVID-19 declaration under Section 564(b)(1) of the Act, 21 U.S.C.section 360bbb-3(b)(1), unless the authorization is terminated  or revoked sooner.       Influenza A by PCR NEGATIVE NEGATIVE Final   Influenza B by PCR NEGATIVE NEGATIVE Final    Comment: (NOTE) The Xpert Xpress SARS-CoV-2/FLU/RSV plus assay is intended as an aid in the diagnosis of influenza from Nasopharyngeal swab specimens and should not be used as a sole basis for treatment. Nasal washings and aspirates are unacceptable for Xpert Xpress SARS-CoV-2/FLU/RSV testing.  Fact Sheet for Patients: BloggerCourse.com  Fact Sheet for Healthcare Providers: SeriousBroker.it  This test is not yet approved or cleared by the [place] FDA and has been authorized for detection and/or diagnosis of SARS-CoV-2 by FDA under an Emergency Use Authorization (EUA). This EUA will remain in effect (meaning this test can be used) for the duration of the COVID-19 declaration under Section 564(b)(1) of the Act, 21 U.S.C. section 360bbb-3(b)(1), unless the authorization is terminated or revoked.  Performed at [facility], [address] [place], [place] [address]   MRSA Next Gen by PCR, Nasal     Status: None   Collection Time: 12/11/21  5:25 PM   Specimen: Nasal Mucosa; Nasal Swab  Result Value Ref Range Status   MRSA by PCR Next Gen NOT DETECTED NOT DETECTED Final    Comment: (NOTE) The  GeneXpert MRSA Assay (FDA approved for NASAL specimens only), is one component of a comprehensive MRSA colonization surveillance program. It is  not intended to diagnose MRSA infection nor to guide or monitor treatment for MRSA infections. Test performance is not FDA approved in patients less than 56 years old. Performed at [facility], [address] [place], [place] [address]     Procedures and diagnostic studies:  No results found.             LOS: 2 days   Natlie Asfour  [facility]   Pager on [email or web address]. If 7PM-7AM, please contact night-coverage at [email or web address]     12/13/2021, 1:36 PM

## 2021-12-13 NOTE — Progress Notes (Signed)
Chaplain Maggie attempted to visit pt who was asleep. Pt's guest was also asleep in the chair. Chaplain expects to follow up to introduce spiritual care.

## 2021-12-13 NOTE — TOC Initial Note (Signed)
Transition of Care [facility]) - Initial/Assessment Note    Patient Details  Name: Shawn Torres MRN: [id number] Date of Birth: April 27, 1966  Transition of Care [facility]) CM/SW Contact:    Chapman Fitch, RN Phone Number: 12/13/2021, 4:10 PM  Clinical Narrative:                  Patient admitted with upper GI bleed Patient assessed by [facility] 11/09/21 Note from that admission below "Patient states that he lives at home with domestic partner.  He states she can assist if needed.  Daughter, son in law transports patient to appointments.  Patient has no concerns about obtaining/taking medications, states his family transports him to the pharmacy.   [facility] given [facility] as he states he has no insurance (consult to financial services made via email).   Patient denies further needs at this time.  TOC contact information given  "  TO to follow for medication needs at discharge        Patient Goals and CMS Choice        Expected Discharge Plan and Services                                                Prior Living Arrangements/Services                       Activities of Daily Living Home Assistive Devices/Equipment: None ADL Screening (condition at time of admission) Patient's cognitive ability adequate to safely complete daily activities?: Yes Is the patient deaf or have difficulty hearing?: No Does the patient have difficulty seeing, even when wearing glasses/contacts?: No Does the patient have difficulty concentrating, remembering, or making decisions?: No Patient able to express need for assistance with ADLs?: Yes Does the patient have difficulty dressing or bathing?: No Independently performs ADLs?: Yes (appropriate for developmental age) Does the patient have difficulty walking or climbing stairs?: No Weakness of Legs: None Weakness of Arms/Hands: None  Permission Sought/Granted                  Emotional Assessment               Admission diagnosis:  Esophageal varices with bleeding in diseases classified elsewhere (HCC) [I85.11] Hematemesis with nausea [K92.0] Acute blood loss anemia (ABLA) [D62] Patient Active Problem List   Diagnosis Date Noted   Acute upper GI bleed 12/13/2021   Acute blood loss anemia (ABLA) 12/11/2021   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   Confusion    SOB (shortness of breath)    Lactic acidosis 11/06/2021   GERD (gastroesophageal reflux disease) 11/06/2021   BPH (benign prostatic hyperplasia) 11/06/2021   AKI (acute kidney injury) (HCC) 11/03/2021   Macrocytic anemia 11/03/2021   Alcoholic cirrhosis of liver with ascites (HCC) 10/29/2021   Alcohol use disorder, severe, in early remission (HCC)    Malnutrition of moderate degree 10/15/2021   Abdominal distention    Acute respiratory failure with hypoxia (HCC)    Secondary esophageal varices with bleeding (HCC)    Hematemesis 10/10/2021   Hepatic encephalopathy    Secondary esophageal varices without bleeding (HCC)    Thrombocytopenia (HCC) 04/30/2021   Type 2 diabetes mellitus, without long-term current use of insulin (HCC) 04/30/2021   Hyponatremia 05/24/2019   Pneumonia due to COVID-19 virus 05/24/2019   Transaminitis 05/24/2019  Alcohol dependence with uncomplicated withdrawal (HCC) 05/24/2019   Hypokalemia 05/24/2019   COVID-19 05/24/2019   PCP:  Kurtis Bushman, MD Pharmacy:   [facility] [phone] - [place], [place] - [address] [place] [address] Phone: [phone] Fax: [phone]     Social Determinants of Health (SDOH) Interventions    Readmission Risk Interventions Readmission Risk Prevention Plan 12/13/2021 10/12/2021  Transportation Screening Complete Complete  PCP or Specialist Appt within 3-5 Days - Complete  HRI or Home Care Consult - Not Complete  HRI or Home Care Consult comments - unsure of discharge needs  Social Work Consult for Recovery Care Planning/Counseling -  Complete  Palliative Care Screening - Not Applicable  Medication Review [employer]) Complete Complete  SW Recovery Care/Counseling Consult Complete -  Palliative Care Screening Not Applicable -  Skilled Nursing Facility Not Applicable -

## 2021-12-13 NOTE — Progress Notes (Signed)
Inpatient Follow-up/Progress Note   Patient ID: Shawn Torres is a 56 y.o. male.  Overnight Events / Subjective Findings Information garnered via daughter at bedside in conversation with patient. He reports overall feeling well although feels more shaky now. Did not receive his lactulose yesterday and octreotide gtt has been off- unclear as to why. Tolerating po w/o issue. No further signs of bleeding. Hgb 6.8 today. Vital signs stable though. No other acute gi complaints.  Review of Systems  Constitutional:  Negative for activity change, appetite change, chills, diaphoresis, fatigue, fever and unexpected weight change.  HENT:  Negative for trouble swallowing and voice change.   Respiratory:  Negative for shortness of breath and wheezing.   Cardiovascular:  Negative for chest pain, palpitations and leg swelling.  Gastrointestinal:  Negative for abdominal distention, abdominal pain, anal bleeding, blood in stool, constipation, diarrhea, nausea and vomiting.  Musculoskeletal:  Negative for arthralgias and myalgias.  Skin:  Negative for color change and pallor.  Neurological:  Negative for dizziness, syncope and weakness.  Psychiatric/Behavioral:  Positive for confusion and decreased concentration. The patient is not nervous/anxious.   All other systems reviewed and are negative.   Medications  Current Facility-Administered Medications:    cefTRIAXone (ROCEPHIN) 2 g in sodium chloride 0.9 % 100 mL IVPB, 2 g, Intravenous, Q24H, Jennye Boroughs, MD, Stopped at 12/12/21 1355   Chlorhexidine Gluconate Cloth 2 % PADS 6 each, 6 each, Topical, Daily, Jennye Boroughs, MD, 6 each at 12/13/21 [id number]   feeding supplement (ENSURE ENLIVE / ENSURE PLUS) liquid 237 mL, 237 mL, Oral, TID BM, Jennye Boroughs, MD   folic acid (FOLVITE) tablet 1 mg, 1 mg, Oral, Daily, Jennye Boroughs, MD, 1 mg at 12/13/21 [id number]   insulin aspart (novoLOG) injection 0-6 Units, 0-6 Units, Subcutaneous, TID WC, Jennye Boroughs, MD, 2  Units at 12/13/21 [id number]   lactulose (CHRONULAC) 10 GM/15ML solution 10 g, 10 g, Oral, TID, Jennye Boroughs, MD, 10 g at 12/13/21 1054   [COMPLETED] octreotide (SANDOSTATIN) 2 mcg/mL load via infusion 50 mcg, 50 mcg, Intravenous, Once, 50 mcg at 12/11/21 1420 **AND** octreotide (SANDOSTATIN) 500 mcg in sodium chloride 0.9 % 250 mL (2 mcg/mL) infusion, 50 mcg/hr, Intravenous, Continuous, Jennye Boroughs, MD, Last Rate: 25 mL/hr at 12/13/21 0753, 50 mcg/hr at 12/13/21 0753   ondansetron (ZOFRAN) tablet 4 mg, 4 mg, Oral, Q6H PRN **OR** ondansetron (ZOFRAN) injection 4 mg, 4 mg, Intravenous, Q6H PRN, Jennye Boroughs, MD   pantoprazole (PROTONIX) injection 40 mg, 40 mg, Intravenous, Q12H, Annamaria Helling, DO, 40 mg at 12/13/21 [id number]   rifaximin (XIFAXAN) tablet 550 mg, 550 mg, Oral, BID, Jennye Boroughs, MD, 550 mg at 12/13/21 [id number]   tamsulosin (FLOMAX) capsule 0.4 mg, 0.4 mg, Oral, Daily, Jennye Boroughs, MD, 0.4 mg at 12/13/21 [id number]   thiamine tablet 100 mg, 100 mg, Oral, Daily, Jennye Boroughs, MD, 100 mg at 12/13/21 0821  cefTRIAXone (ROCEPHIN)  IV Stopped (12/12/21 1355)   octreotide  (SANDOSTATIN)    IV infusion 50 mcg/hr (12/13/21 0753)    ondansetron **OR** ondansetron (ZOFRAN) IV   Objective    Vitals:   12/12/21 1606 12/12/21 2337 12/13/21 0527 12/13/21 0806  BP: 113/63 118/70 118/76 131/84  Pulse: 66 65 63 63  Resp: 18 17 18 16   Temp: 98 F (36.7 C) 97.9 F (36.6 C) 98 F (36.7 C) 98.2 F (36.8 C)  TempSrc: Oral Oral    SpO2: 100% 100% 100% 100%  Weight:      Height:  Physical Exam Vitals and nursing note reviewed.  Constitutional:      General: He is not in acute distress.    Appearance: He is ill-appearing (chronically). He is not toxic-appearing or diaphoretic.  HENT:     Head: Normocephalic and atraumatic.     Nose: Nose normal.     Mouth/Throat:     Mouth: Mucous membranes are moist.     Pharynx: Oropharynx is clear.  Eyes:     General: No scleral icterus.     Extraocular Movements: Extraocular movements intact.  Cardiovascular:     Rate and Rhythm: Normal rate and regular rhythm.     Heart sounds: Normal heart sounds. No murmur heard.   No friction rub. No gallop.  Pulmonary:     Effort: Pulmonary effort is normal. No respiratory distress.     Breath sounds: Normal breath sounds. No wheezing, rhonchi or rales.  Abdominal:     General: Bowel sounds are normal. There is no distension.     Palpations: Abdomen is soft.     Tenderness: There is no abdominal tenderness. There is no guarding or rebound.  Musculoskeletal:     Cervical back: Neck supple.     Right lower leg: No edema.     Left lower leg: No edema.  Skin:    General: Skin is warm and dry.     Coloration: Skin is not jaundiced or pale.  Neurological:     Mental Status: He is alert and oriented to person, place, and time.     Comments: Slight asterixis. Answers questions appropriately  Psychiatric:        Mood and Affect: Mood normal.        Behavior: Behavior normal.        Thought Content: Thought content normal.        Judgment: Judgment normal.     Laboratory Data Recent Labs  Lab 12/11/21 1212 12/11/21 2043 12/12/21 1940 12/13/21 0133 12/13/21 0550  WBC 8.8  --   --   --  13.9*  HGB 6.4*   < > 7.2* 7.3* 7.8*  HCT 18.2*   < > 20.5* 19.6* 21.8*  PLT 136*  --   --   --  117*  NEUTOPHILPCT  --   --   --   --  84  LYMPHOPCT  --   --   --   --  11  MONOPCT  --   --   --   --  4  EOSPCT  --   --   --   --  0   < > = values in this interval not displayed.    Recent Labs  Lab 12/11/21 1212 12/12/21 0554 12/13/21 0550  NA 134* 136 132*  K 4.8 4.9 5.2*  CL 107 109 109  CO2 17* 14* 17*  BUN 61* 73* 64*  CREATININE 1.53* 1.86* 1.63*  CALCIUM 8.2* 8.1* 8.0*  PROT 6.1*  --   --   BILITOT 1.7*  --   --   ALKPHOS 121  --   --   ALT 16  --   --   AST 34  --   --   GLUCOSE 169* 180* 179*    Recent Labs  Lab 12/11/21 1313  INR 2.3*       Imaging  Studies: No results found.  Assessment:  # Upper GI/Variceal bleeding - EGD on 12/11/21 with EVL x4 with hemostasis - hgb 6.8 today (12/13/21) from 7.8 yesterday-  no further signs of bleeding - on octreotide gtt and protonix iv bid - on ceftriaxone -EGD on November 20 with grade 3 varices with stigmata of recent bleeding status post banding.  Also noted to have a duodenal erosion at that time.-Last underwent EGD on November 20 with grade 3 varices with stigmata of recent bleeding status post banding.  Also noted to have a duodenal erosion at that time. 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 -MELD-NA 25 (12/11/21); Child Pugh C - sober for 8 months   # Portal HTN with sequelae - encephalopathy, coagulopathy, hypoalbuminemia, hyperbilirubinemia, hyponatremia, esophageal varices, hyperammonemia, thrombocytopenia   # Acute encephalopathy  - pt's daughter notes he is having some hallucinations   # CKD   # acute on chronic anemia 2/2 blood loss  Plan:  S/p EGD with EVL x4 on 12/12/21 Restart lactulose and octreotide gtt  - ensure having 3 soft bm per day, minimum Continue octreotide drip for total 72 hours Monitor H&H; transfusion and resuscitation as per primary team Avoid frequent lab draws to prevent lab induced anemia Continue ceftriaxone 1 g every 24 hours IV for total 7 days for SBP prophylaxis. If discharged prior- can start ciprofloxacin 500mg  twice daily to complete course Transition to Protonix 40 mg IV bid- transition to twice daily po upon discharge Repeat EGD in 2-3 weeks for continued variceal eradication- discussed this with patient's daughter Continue xifaxin Hold dvt ppx and avoid nsaids Monitor CBC CMP INR daily Paracentesis with studies as needed Advance diet as tolerated  I personally performed the service.  Management of other medical comorbidities as per primary  team  Thank you for allowing [place] to participate in this patient's care. Please don't hesitate to call if any questions or concerns arise.   Annamaria Helling, DO [facility]  Portions of the record may have been created with voice recognition software. Occasional wrong-word or 'sound-a-like' substitutions may have occurred due to the inherent limitations of voice recognition software.  Read the chart carefully and recognize, using context, where substitutions may have occurred.

## 2021-12-13 NOTE — Progress Notes (Signed)
Inpatient Diabetes Program Recommendations  AACE/ADA: New Consensus Statement on Inpatient Glycemic Control  Target Ranges:  Prepandial:   less than 140 mg/dL      Peak postprandial:   less than 180 mg/dL (1-2 hours)      Critically ill patients:  140 - 180 mg/dL    Latest Reference Range & Units 12/13/21 08:04 12/13/21 11:34  Glucose-Capillary 70 - 99 mg/dL 798 (H) 921 (H)    Latest Reference Range & Units 12/11/21 16:51 12/12/21 12:31 12/12/21 23:31  Glucose-Capillary 70 - 99 mg/dL 194 (H) 174 (H) 081 (H)   Review of Glycemic Control  Diabetes history: DM2 Outpatient Diabetes medications: None Current orders for Inpatient glycemic control: Novolog 0-6 units TID with meals  Inpatient Diabetes Program Recommendations:    Insulin: Noted patient received Decadron 5 mg x1 on 12/11/21. Fasting glucose 247 mg/dl today and CBG 448 mg/dl at 18:56 today. Please consider ordering one time Semglee 5 units X1 now. Also, please consider increasing Novolog to 0-9 units TID with meals and adding Novolog 0-5 units QHS for bedtime correction.  Thanks, Orlando Penner, RN, MSN, CDE Diabetes Coordinator Inpatient Diabetes Program [phone] (Team Pager from 8am to 5pm)

## 2021-12-14 LAB — CBC WITH DIFFERENTIAL/PLATELET
Abs Immature Granulocytes: 0.13 10*3/uL — ABNORMAL HIGH (ref 0.00–0.07)
Basophils Absolute: 0 10*3/uL (ref 0.0–0.1)
Basophils Relative: 0 %
Eosinophils Absolute: 0 10*3/uL (ref 0.0–0.5)
Eosinophils Relative: 0 %
HCT: 23.8 % — ABNORMAL LOW (ref 39.0–52.0)
Hemoglobin: 8.6 g/dL — ABNORMAL LOW (ref 13.0–17.0)
Immature Granulocytes: 1 %
Lymphocytes Relative: 18 %
Lymphs Abs: 2.2 10*3/uL (ref 0.7–4.0)
MCH: 32.5 pg (ref 26.0–34.0)
MCHC: 36.1 g/dL — ABNORMAL HIGH (ref 30.0–36.0)
MCV: 89.8 fL (ref 80.0–100.0)
Monocytes Absolute: 1.2 10*3/uL — ABNORMAL HIGH (ref 0.1–1.0)
Monocytes Relative: 10 %
Neutro Abs: 8.2 10*3/uL — ABNORMAL HIGH (ref 1.7–7.7)
Neutrophils Relative %: 71 %
Platelets: 111 10*3/uL — ABNORMAL LOW (ref 150–400)
RBC: 2.65 MIL/uL — ABNORMAL LOW (ref 4.22–5.81)
RDW: 16.9 % — ABNORMAL HIGH (ref 11.5–15.5)
WBC: 11.7 10*3/uL — ABNORMAL HIGH (ref 4.0–10.5)
nRBC: 0 % (ref 0.0–0.2)

## 2021-12-14 LAB — TYPE AND SCREEN
ABO/RH(D): O POS
Antibody Screen: NEGATIVE
Unit division: 0
Unit division: 0
Unit division: 0

## 2021-12-14 LAB — BPAM RBC
Blood Product Expiration Date: 202302222359
Blood Product Expiration Date: 202302262359
Blood Product Expiration Date: 202302262359
ISSUE DATE / TIME: 202301211408
ISSUE DATE / TIME: 202301211749
ISSUE DATE / TIME: 202301231403
Unit Type and Rh: 5100
Unit Type and Rh: 5100
Unit Type and Rh: 5100

## 2021-12-14 LAB — BASIC METABOLIC PANEL
Anion gap: 5 (ref 5–15)
BUN: 47 mg/dL — ABNORMAL HIGH (ref 6–20)
CO2: 20 mmol/L — ABNORMAL LOW (ref 22–32)
Calcium: 7.8 mg/dL — ABNORMAL LOW (ref 8.9–10.3)
Chloride: 105 mmol/L (ref 98–111)
Creatinine, Ser: 1.27 mg/dL — ABNORMAL HIGH (ref 0.61–1.24)
GFR, Estimated: >60 mL/min (ref >60)
Glucose, Bld: 215 mg/dL — ABNORMAL HIGH (ref 70–99)
Potassium: 5.1 mmol/L (ref 3.5–5.1)
Sodium: 130 mmol/L — ABNORMAL LOW (ref 135–145)

## 2021-12-14 LAB — GLUCOSE, CAPILLARY
Glucose-Capillary: 245 mg/dL — ABNORMAL HIGH (ref 70–99)
Glucose-Capillary: 182 mg/dL — ABNORMAL HIGH (ref 70–99)

## 2021-12-14 MED ORDER — CIPROFLOXACIN HCL 500 MG PO TABS: 500.0000 mg | ORAL_TABLET | Freq: Two times a day (BID) | ORAL | 0 refills | Status: DC

## 2021-12-14 MED ORDER — RIFAXIMIN 550 MG PO TABS: 550.0000 mg | ORAL_TABLET | Freq: Two times a day (BID) | ORAL | 0 refills | Status: AC

## 2021-12-14 MED ORDER — PANTOPRAZOLE SODIUM 40 MG PO TBEC: 40.0000 mg | DELAYED_RELEASE_TABLET | Freq: Two times a day (BID) | ORAL | 0 refills | Status: DC

## 2021-12-14 NOTE — Progress Notes (Signed)
Discharge instructions and med details reviewed with patient and daughter. All questions answered. Printed AVS given to patient. 2 IVs removed. No complications.  Pt escorted out via wheelchair.

## 2021-12-14 NOTE — Progress Notes (Addendum)
Inpatient Follow-up/Progress Note   Patient ID: Shawn Torres is a 56 y.o. male.  Overnight Events / Subjective Findings Information garnered via nurse at bedside in conversation with patient.  Pt did receive 1 u prbc after hgb reached 6.8. It has responded to 8.6. Vital signs remain stable. Still w/o signs of bleeding. Octreotide was resumed yesterday as was his lactulose. He feels like he thinking normally. No abdominal pain or distention. Tolerating PO. No other acute gi complaints.  Review of Systems  Constitutional:  Negative for activity change, appetite change, chills, diaphoresis, fatigue, fever and unexpected weight change.  HENT:  Negative for trouble swallowing and voice change.   Respiratory:  Negative for shortness of breath and wheezing.   Cardiovascular:  Negative for chest pain, palpitations and leg swelling.  Gastrointestinal:  Negative for abdominal distention, abdominal pain, anal bleeding, blood in stool, constipation, diarrhea, nausea and vomiting.  Musculoskeletal:  Negative for arthralgias and myalgias.  Skin:  Negative for color change and pallor.  Neurological:  Negative for dizziness, syncope and weakness.  Psychiatric/Behavioral:  Negative for confusion and decreased concentration. The patient is not nervous/anxious.   All other systems reviewed and are negative.   Medications  Current Facility-Administered Medications:    cefTRIAXone (ROCEPHIN) 2 g in sodium chloride 0.9 % 100 mL IVPB, 2 g, Intravenous, Q24H, Jennye Boroughs, MD, Stopping Infusion hung by another clincian at 12/13/21 1230   Chlorhexidine Gluconate Cloth 2 % PADS 6 each, 6 each, Topical, Daily, Jennye Boroughs, MD, 6 each at 12/13/21 [id number]   feeding supplement (ENSURE ENLIVE / ENSURE PLUS) liquid 237 mL, 237 mL, Oral, TID BM, Jennye Boroughs, MD   folic acid (FOLVITE) tablet 1 mg, 1 mg, Oral, Daily, Jennye Boroughs, MD, 1 mg at 12/14/21 0848   furosemide (LASIX) tablet 40 mg, 40 mg, Oral, Daily,  Jennye Boroughs, MD, 40 mg at 12/14/21 0848   insulin aspart (novoLOG) injection 0-6 Units, 0-6 Units, Subcutaneous, TID WC, Jennye Boroughs, MD, 2 Units at 12/14/21 0848   lactulose (CHRONULAC) 10 GM/15ML solution 20 g, 20 g, Oral, TID, Annamaria Helling, DO, 20 g at 12/14/21 0848   [COMPLETED] octreotide (SANDOSTATIN) 2 mcg/mL load via infusion 50 mcg, 50 mcg, Intravenous, Once, 50 mcg at 12/11/21 1420 **AND** octreotide (SANDOSTATIN) 500 mcg in sodium chloride 0.9 % 250 mL (2 mcg/mL) infusion, 50 mcg/hr, Intravenous, Continuous, Jennye Boroughs, MD, Last Rate: 25 mL/hr at 12/14/21 0555, 50 mcg/hr at 12/14/21 0555   ondansetron (ZOFRAN) tablet 4 mg, 4 mg, Oral, Q6H PRN **OR** ondansetron (ZOFRAN) injection 4 mg, 4 mg, Intravenous, Q6H PRN, Jennye Boroughs, MD   pantoprazole (PROTONIX) injection 40 mg, 40 mg, Intravenous, Q12H, Annamaria Helling, DO, 40 mg at 12/14/21 0848   rifaximin (XIFAXAN) tablet 550 mg, 550 mg, Oral, BID, Jennye Boroughs, MD, 550 mg at 12/14/21 0855   tamsulosin (FLOMAX) capsule 0.4 mg, 0.4 mg, Oral, Daily, Jennye Boroughs, MD, 0.4 mg at 12/14/21 0848   thiamine tablet 100 mg, 100 mg, Oral, Daily, Jennye Boroughs, MD, 100 mg at 12/14/21 0848  cefTRIAXone (ROCEPHIN)  IV Stopped (12/13/21 1230)   octreotide  (SANDOSTATIN)    IV infusion 50 mcg/hr (12/14/21 0555)    ondansetron **OR** ondansetron (ZOFRAN) IV   Objective    Vitals:   12/13/21 1435 12/13/21 1657 12/13/21 2009 12/14/21 0803  BP: 127/75 131/90 121/73 133/81  Pulse: 64 60 (!) 55 60  Resp: 18 18 18 20   Temp: 98 F (36.7 C) 98 F (36.7 C)  97.6 F (36.4 C) 98.5 F (36.9 C)  TempSrc: Oral Oral  Oral  SpO2: 100% 100% 100% 100%  Weight:      Height:        Physical Exam Vitals and nursing note reviewed.  Constitutional:      General: He is not in acute distress.    Appearance: He is ill-appearing (chronically). He is not toxic-appearing or diaphoretic.  HENT:     Head: Normocephalic and atraumatic.      Nose: Nose normal.     Mouth/Throat:     Mouth: Mucous membranes are moist.     Pharynx: Oropharynx is clear.  Eyes:     General: No scleral icterus.    Extraocular Movements: Extraocular movements intact.  Cardiovascular:     Rate and Rhythm: Normal rate and regular rhythm.     Heart sounds: Normal heart sounds. No murmur heard.   No friction rub. No gallop.  Pulmonary:     Effort: Pulmonary effort is normal. No respiratory distress.     Breath sounds: Normal breath sounds. No wheezing, rhonchi or rales.  Abdominal:     General: Bowel sounds are normal. There is no distension.     Palpations: Abdomen is soft.     Tenderness: There is no abdominal tenderness. There is no guarding or rebound.  Musculoskeletal:     Cervical back: Neck supple.     Right lower leg: No edema.     Left lower leg: No edema.  Skin:    General: Skin is warm and dry.     Coloration: Skin is not jaundiced or pale.  Neurological:     Mental Status: He is alert and oriented to person, place, and time. Mental status is at baseline.     Comments: Answers questions appropriately  Psychiatric:        Mood and Affect: Mood normal.        Behavior: Behavior normal.        Thought Content: Thought content normal.        Judgment: Judgment normal.     Laboratory Data Recent Labs  Lab 12/11/21 1212 12/11/21 2043 12/13/21 0550 12/13/21 1204 12/14/21 0451  WBC 8.8  --  13.9*  --  11.7*  HGB 6.4*   < > 7.8* 6.8* 8.6*  HCT 18.2*   < > 21.8* 19.0* 23.8*  PLT 136*  --  117*  --  111*  NEUTOPHILPCT  --   --  84  --  71  LYMPHOPCT  --   --  11  --  18  MONOPCT  --   --  4  --  10  EOSPCT  --   --  0  --  0   < > = values in this interval not displayed.    Recent Labs  Lab 12/11/21 1212 12/12/21 0554 12/13/21 0550 12/14/21 0451  NA 134* 136 132* 130*  K 4.8 4.9 5.2* 5.1  CL 107 109 109 105  CO2 17* 14* 17* 20*  BUN 61* 73* 64* 47*  CREATININE 1.53* 1.86* 1.63* 1.27*  CALCIUM 8.2* 8.1* 8.0*  7.8*  PROT 6.1*  --   --   --   BILITOT 1.7*  --   --   --   ALKPHOS 121  --   --   --   ALT 16  --   --   --   AST 34  --   --   --   GLUCOSE  169* 180* 179* 215*    Recent Labs  Lab 12/11/21 1313  INR 2.3*       Imaging Studies: No results found.  Assessment:  # Upper GI/Variceal bleeding - EGD on 12/11/21 with EVL x4 with hemostasis - hgb 8.6 today (12/14/21) after transfusion no further signs of bleeding - on octreotide gtt and protonix iv bid - on ceftriaxone -EGD on November 20 with grade 3 varices with stigmata of recent bleeding status post banding.  Also noted to have a duodenal erosion at that time.-Last underwent EGD on November 20 with grade 3 varices with stigmata of recent bleeding status post banding.  Also noted to have a duodenal erosion at that time. 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 -MELD-NA 25 (12/11/21); Child Pugh C - sober for 8 months   # Portal HTN with sequelae - encephalopathy, coagulopathy, hypoalbuminemia, hyperbilirubinemia, hyponatremia, esophageal varices, hyperammonemia, thrombocytopenia   # Acute encephalopathy  - pt's daughter notes he is having some hallucinations   # CKD   # acute on chronic anemia 2/2 blood loss  Plan:  S/p EGD with EVL x4 on 12/12/21 On lactulose and octreotide gtt  - ensure having 3 soft bm per day, minimum Continue octreotide drip for total 72 hours combined Monitor H&H; transfusion and resuscitation as per primary team Avoid frequent lab draws to prevent lab induced anemia Continue ceftriaxone 1 g every 24 hours IV for total 7 days for SBP prophylaxis. If discharged prior- can start ciprofloxacin 500mg  twice daily to complete course Transition to Protonix 40 mg IV bid- transition to twice daily po upon discharge Repeat EGD in 2-3 weeks for continued variceal eradication- discussed this with patient's daughter  previously. Office will call to schedule unless he wants to proceed with previous GI providers - has seen both [facility] and [facility] in the past Continue xifaxin bid Hold dvt ppx and avoid nsaids Monitor CBC CMP INR daily Paracentesis with studies as needed Advance diet as tolerated  GI to sign off. Available as needed.  I personally performed the service.  Management of other medical comorbidities as per primary team  Thank you for allowing [place] to participate in this patient's care. Please don't hesitate to call if any questions or concerns arise.   Annamaria Helling, DO [facility]  Portions of the record may have been created with voice recognition software. Occasional wrong-word or 'sound-a-like' substitutions may have occurred due to the inherent limitations of voice recognition software.  Read the chart carefully and recognize, using context, where substitutions may have occurred.

## 2021-12-14 NOTE — Progress Notes (Signed)
Inpatient Diabetes Program Recommendations  AACE/ADA: New Consensus Statement on Inpatient Glycemic Control   Target Ranges:  Prepandial:   less than 140 mg/dL      Peak postprandial:   less than 180 mg/dL (1-2 hours)      Critically ill patients:  140 - 180 mg/dL    Latest Reference Range & Units 12/14/21 07:55  Glucose-Capillary 70 - 99 mg/dL 001 (H)    Latest Reference Range & Units 12/13/21 08:04 12/13/21 11:34 12/13/21 16:24 12/13/21 20:42  Glucose-Capillary 70 - 99 mg/dL 749 (H) 449 (H) 675 (H) 192 (H)   Review of Glycemic Control  Diabetes history: DM2 Outpatient Diabetes medications: None Current orders for Inpatient glycemic control: Novolog 0-6 units TID with meals   Inpatient Diabetes Program Recommendations:     Insulin: Noted patient received Decadron 5 mg x1 on 12/11/21. Fasting glucose 247 mg/dl on 08/06/37 and 466 mg/dl today.  Please consider ordering Semglee 5 units daily. Also, please consider increasing Novolog to 0-9 units TID with meals and adding Novolog 0-5 units QHS for bedtime correction.  Outpatient DM medications: May want to consider discharging on low dose basal insulin. Per chart review, patient was taking Lantus 10 units daily and Metformin 500 mg BID for DM but both were discontinued on 10/16/21 (was inpt 10/10/21-10/16/21) per discharge summary.  Thanks, Orlando Penner, RN, MSN, CDE Diabetes Coordinator Inpatient Diabetes Program [phone] (Team Pager from 8am to 5pm)

## 2021-12-14 NOTE — Discharge Summary (Signed)
Physician Discharge Summary  Shawn Torres [id number] DOB: [birth year] DOA: 12/11/2021  PCP: Shawn Never, MD  Admit date: 12/11/2021 Discharge date: 12/14/2021  Discharge disposition: Home   Recommendations for Outpatient Follow-Up:   Follow-up with PCP in 1 week Follow-up with gastroenterologist, Dr. Virgina Jock, for repeat EGD in 2 to 3 weeks.   Discharge Diagnosis:   Principal Problem:   Acute upper GI bleed Active Problems:   Alcoholic cirrhosis of liver with ascites (HCC)   Type 2 diabetes mellitus, without long-term current use of insulin (HCC)   GERD (gastroesophageal reflux disease)   Acute blood loss anemia (ABLA)    Discharge Condition: Stable.  Diet recommendation:  Diet Order             Diet - low sodium heart healthy           Diet Carb Modified           Diet Carb Modified Fluid consistency: Thin; Room service appropriate? Yes  Diet effective now                     Code Status: Full Code     Hospital Course:   [name] is a 56 y.o. male with medical history significant for CKD stage IIIa, alcoholic liver cirrhosis, portal hypertension, ascites with removal of 6 L of fluid on 12/10/2021, who presented to the hospital because of hematemesis.  He was admitted to the hospital for acute upper GI bleeding.  EGD showed portal hypertensive gastropathy and grade 2 esophageal varices that were completely eradicated and banded.  He was treated with IV ceftriaxone, IV octreotide infusion and IV Protonix.  He was transfused with about 5 units of packed red blood cells for acute blood loss anemia.  He had AKI which improved with IV fluids.  His condition has improved and he is deemed stable for discharge to home. Discharge plan was discussed with the patient and his daughter.  Spanish interpretation service via iPad was used for this encounter.    Medical Consultants:   Gastroenterologist   Discharge Exam:    Vitals:   12/13/21 1435  12/13/21 1657 12/13/21 2009 12/14/21 0803  BP: 127/75 131/90 121/73 133/81  Pulse: 64 60 (!) 55 60  Resp: 18 18 18 20   Temp: 98 F (36.7 C) 98 F (36.7 C) 97.6 F (36.4 C) 98.5 F (36.9 C)  TempSrc: Oral Oral  Oral  SpO2: 100% 100% 100% 100%  Weight:      Height:         GEN: NAD SKIN: No rash EYES: EOMI ENT: MMM CV: RRR PULM: CTA B ABD: soft, obese, NT, +BS CNS: AAO x 3, non focal EXT: No edema or tenderness   The results of significant diagnostics from this hospitalization (including imaging, microbiology, ancillary and laboratory) are listed below for reference.     Procedures and Diagnostic Studies:   No results found.   Labs:   Basic Metabolic Panel: Recent Labs  Lab 12/11/21 1212 12/12/21 0554 12/13/21 0550 12/14/21 0451  NA 134* 136 132* 130*  K 4.8 4.9 5.2* 5.1  CL 107 109 109 105  CO2 17* 14* 17* 20*  GLUCOSE 169* 180* 179* 215*  BUN 61* 73* 64* 47*  CREATININE 1.53* 1.86* 1.63* 1.27*  CALCIUM 8.2* 8.1* 8.0* 7.8*   GFR Estimated Creatinine Clearance: 64.8 mL/min (A) (by C-G formula based on SCr of 1.27 mg/dL (H)). Liver Function Tests: Recent Labs  Lab 12/11/21  1212  AST 34  ALT 16  ALKPHOS 121  BILITOT 1.7*  PROT 6.1*  ALBUMIN 2.9*   No results for input(s): LIPASE, AMYLASE in the last 168 hours. No results for input(s): AMMONIA in the last 168 hours. Coagulation profile Recent Labs  Lab 12/11/21 1313  INR 2.3*    CBC: Recent Labs  Lab 12/11/21 1212 12/11/21 2043 12/12/21 1940 12/13/21 0133 12/13/21 0550 12/13/21 1204 12/14/21 0451  WBC 8.8  --   --   --  13.9*  --  11.7*  NEUTROABS  --   --   --   --  11.7*  --  8.2*  HGB 6.4*   < > 7.2* 7.3* 7.8* 6.8* 8.6*  HCT 18.2*   < > 20.5* 19.6* 21.8* 19.0* 23.8*  MCV 102.2*  --   --   --  92.0  --  89.8  PLT 136*  --   --   --  117*  --  111*   < > = values in this interval not displayed.   Cardiac Enzymes: No results for input(s): CKTOTAL, CKMB, CKMBINDEX, TROPONINI in  the last 168 hours. BNP: Invalid input(s): POCBNP CBG: Recent Labs  Lab 12/13/21 1134 12/13/21 1624 12/13/21 2042 12/14/21 0755 12/14/21 1212  GLUCAP 313* 168* 192* 245* 182*   D-Dimer No results for input(s): DDIMER in the last 72 hours. Hgb A1c No results for input(s): HGBA1C in the last 72 hours. Lipid Profile No results for input(s): CHOL, HDL, LDLCALC, TRIG, CHOLHDL, LDLDIRECT in the last 72 hours. Thyroid function studies No results for input(s): TSH, T4TOTAL, T3FREE, THYROIDAB in the last 72 hours.  Invalid input(s): FREET3 Anemia work up No results for input(s): VITAMINB12, FOLATE, FERRITIN, TIBC, IRON, RETICCTPCT in the last 72 hours. Microbiology Recent Results (from the past 240 hour(s))  Resp Panel by RT-PCR (Flu A&B, Covid) Nasopharyngeal Swab     Status: None   Collection Time: 12/11/21 12:57 PM   Specimen: Nasopharyngeal Swab; Nasopharyngeal(NP) swabs in vial transport medium  Result Value Ref Range Status   SARS Coronavirus 2 by RT PCR NEGATIVE NEGATIVE Final    Comment: (NOTE) SARS-CoV-2 target nucleic acids are NOT DETECTED.  The SARS-CoV-2 RNA is generally detectable in upper respiratory specimens during the acute phase of infection. The lowest concentration of SARS-CoV-2 viral copies this assay can detect is 138 copies/mL. A negative result does not preclude SARS-Cov-2 infection and should not be used as the sole basis for treatment or other patient management decisions. A negative result may occur with  improper specimen collection/handling, submission of specimen other than nasopharyngeal swab, presence of viral mutation(s) within the areas targeted by this assay, and inadequate number of viral copies(<138 copies/mL). A negative result must be combined with clinical observations, patient history, and epidemiological information. The expected result is Negative.  Fact Sheet for Patients:  BloggerCourse.comhttps://www.fda.gov/media/152166/download  Fact Sheet for  Healthcare Providers:  SeriousBroker.ithttps://www.fda.gov/media/152162/download  This test is no t yet approved or cleared by the [place] FDA and  has been authorized for detection and/or diagnosis of SARS-CoV-2 by FDA under an Emergency Use Authorization (EUA). This EUA will remain  in effect (meaning this test can be used) for the duration of the COVID-19 declaration under Section 564(b)(1) of the Act, 21 U.S.C.section 360bbb-3(b)(1), unless the authorization is terminated  or revoked sooner.       Influenza A by PCR NEGATIVE NEGATIVE Final   Influenza B by PCR NEGATIVE NEGATIVE Final    Comment: (NOTE) The  Xpert Xpress SARS-CoV-2/FLU/RSV plus assay is intended as an aid in the diagnosis of influenza from Nasopharyngeal swab specimens and should not be used as a sole basis for treatment. Nasal washings and aspirates are unacceptable for Xpert Xpress SARS-CoV-2/FLU/RSV testing.  Fact Sheet for Patients: EntrepreneurPulse.com.au  Fact Sheet for Healthcare Providers: IncredibleEmployment.be  This test is not yet approved or cleared by the [place] FDA and has been authorized for detection and/or diagnosis of SARS-CoV-2 by FDA under an Emergency Use Authorization (EUA). This EUA will remain in effect (meaning this test can be used) for the duration of the COVID-19 declaration under Section 564(b)(1) of the Act, 21 U.S.C. section 360bbb-3(b)(1), unless the authorization is terminated or revoked.  Performed at [facility], [address] [place], [place] [address]   MRSA Next Gen by PCR, Nasal     Status: None   Collection Time: 12/11/21  5:25 PM   Specimen: Nasal Mucosa; Nasal Swab  Result Value Ref Range Status   MRSA by PCR Next Gen NOT DETECTED NOT DETECTED Final    Comment: (NOTE) The GeneXpert MRSA Assay (FDA approved for NASAL specimens only), is one component of a comprehensive MRSA colonization surveillance program. It is  not intended to diagnose MRSA infection nor to guide or monitor treatment for MRSA infections. Test performance is not FDA approved in patients less than 30 years old. Performed at [facility], [address], [place], [place] [address]      Discharge Instructions:   Discharge Instructions     Diet - low sodium heart healthy   Complete by: As directed    Diet Carb Modified   Complete by: As directed    Increase activity slowly   Complete by: As directed       Allergies as of 12/14/2021   No Known Allergies      Medication List     STOP taking these medications    ondansetron 4 MG disintegrating tablet Commonly known as: ZOFRAN-ODT       TAKE these medications    (feeding supplement) PROSource Plus liquid Take 30 mLs by mouth 2 (two) times daily between meals.   feeding supplement Liqd Take 237 mLs by mouth 3 (three) times daily between meals.   carvedilol 6.25 MG tablet Commonly known as: COREG Take by mouth.   ciprofloxacin 500 MG tablet Commonly known as: Cipro Take 1 tablet (500 mg total) by mouth 2 (two) times daily for 4 days.   folic acid 1 MG tablet Commonly known as: FOLVITE Take 1 tablet by mouth daily.   furosemide 40 MG tablet Commonly known as: LASIX Take 1 tablet (40 mg total) by mouth daily.   lactulose 10 GM/15ML solution Commonly known as: CHRONULAC Take by mouth.   pantoprazole 40 MG tablet Commonly known as: PROTONIX Take 1 tablet (40 mg total) by mouth 2 (two) times daily. What changed:  how much to take when to take this   rifaximin 550 MG Tabs tablet Commonly known as: XIFAXAN Take 1 tablet (550 mg total) by mouth 2 (two) times daily. What changed:  how much to take when to take this   spironolactone 100 MG tablet Commonly known as: ALDACTONE Take 1 tablet (100 mg total) by mouth daily.   tamsulosin 0.4 MG Caps capsule Commonly known as: FLOMAX Take 1 capsule (0.4 mg total) by mouth daily.    thiamine 100 MG tablet Take 1 tablet (100 mg total) by mouth daily.  If you experience worsening of your admission symptoms, develop shortness of breath, life threatening emergency, suicidal or homicidal thoughts you must seek medical attention immediately by calling 911 or calling your MD immediately  if symptoms less severe.   You must read complete instructions/literature along with all the possible adverse reactions/side effects for all the medicines you take and that have been prescribed to you. Take any new medicines after you have completely understood and accept all the possible adverse reactions/side effects.    Please note   You were cared for by a hospitalist during your hospital stay. If you have any questions about your discharge medications or the care you received while you were in the hospital after you are discharged, you can call the unit and asked to speak with the hospitalist on call if the hospitalist that took care of you is not available. Once you are discharged, your primary care physician will handle any further medical issues. Please note that NO REFILLS for any discharge medications will be authorized once you are discharged, as it is imperative that you return to your primary care physician (or establish a relationship with a primary care physician if you do not have one) for your aftercare needs so that they can reassess your need for medications and monitor your lab values.       Time coordinating discharge: 35 minutes  Signed:  Alixandra Alfieri  [facility] 12/14/2021, 1:16 PM   Pager on [email or web address]. If 7PM-7AM, please contact night-coverage at [email or web address]

## 2021-12-14 NOTE — TOC Progression Note (Deleted)
Transition of Care [facility]) - Progression Note    Patient Details  Name: Shawn Torres MRN: [id number] Date of Birth: February 04, 1966  Transition of [facility]) CM/SW Contact  Eileen Stanford, LCSW Phone Number: 12/14/2021, 1:38 PM  Clinical Narrative:   [place] Voucher printed. RN notified.         Expected Discharge Plan and Services           Expected Discharge Date: 12/14/21                                     Social Determinants of Health (SDOH) Interventions    Readmission Risk Interventions Readmission Risk Prevention Plan 12/13/2021 10/12/2021  Transportation Screening Complete Complete  PCP or Specialist Appt within 3-5 Days - Complete  HRI or [place] - Not Complete  HRI or Home Care Consult comments - unsure of discharge needs  Social Work Consult for [place] Planning/Counseling - Complete  Palliative Care Screening - Not Applicable  Medication Review Press photographer) Complete Complete  SW Recovery Care/Counseling Consult Complete -  Palliative Care Screening Not Applicable -  [place] Not Applicable -

## 2021-12-16 LAB — PREPARE RBC (CROSSMATCH)

## 2021-12-18 ENCOUNTER — Emergency Department: Payer: Medicaid Other

## 2021-12-18 ENCOUNTER — Encounter: Payer: Self-pay | Admitting: Internal Medicine

## 2021-12-18 ENCOUNTER — Inpatient Hospital Stay
Admission: EM | Admit: 2021-12-18 | Discharge: 2021-12-21 | DRG: 432 | Disposition: A | Discharge status: Home / Self Care | Payer: Medicaid Other | Attending: Internal Medicine | Admitting: Internal Medicine

## 2021-12-18 ENCOUNTER — Inpatient Hospital Stay: Payer: Medicaid Other

## 2021-12-18 ENCOUNTER — Other Ambulatory Visit: Payer: Self-pay

## 2021-12-18 DIAGNOSIS — D72829 Elevated white blood cell count, unspecified: Secondary | ICD-10-CM | POA: Diagnosis present

## 2021-12-18 DIAGNOSIS — D6959 Other secondary thrombocytopenia: Secondary | ICD-10-CM | POA: Diagnosis present

## 2021-12-18 DIAGNOSIS — R188 Other ascites: Secondary | ICD-10-CM | POA: Insufficient documentation

## 2021-12-18 DIAGNOSIS — N400 Enlarged prostate without lower urinary tract symptoms: Secondary | ICD-10-CM | POA: Diagnosis present

## 2021-12-18 DIAGNOSIS — D61818 Other pancytopenia: Secondary | ICD-10-CM | POA: Diagnosis present

## 2021-12-18 DIAGNOSIS — I2510 Atherosclerotic heart disease of native coronary artery without angina pectoris: Secondary | ICD-10-CM | POA: Diagnosis present

## 2021-12-18 DIAGNOSIS — D696 Thrombocytopenia, unspecified: Secondary | ICD-10-CM | POA: Diagnosis present

## 2021-12-18 DIAGNOSIS — K766 Portal hypertension: Secondary | ICD-10-CM | POA: Diagnosis present

## 2021-12-18 DIAGNOSIS — N1831 Chronic kidney disease, stage 3a: Secondary | ICD-10-CM | POA: Diagnosis present

## 2021-12-18 DIAGNOSIS — R001 Bradycardia, unspecified: Secondary | ICD-10-CM | POA: Diagnosis present

## 2021-12-18 DIAGNOSIS — E119 Type 2 diabetes mellitus without complications: Secondary | ICD-10-CM

## 2021-12-18 DIAGNOSIS — R1084 Generalized abdominal pain: Secondary | ICD-10-CM

## 2021-12-18 DIAGNOSIS — D631 Anemia in chronic kidney disease: Secondary | ICD-10-CM | POA: Diagnosis present

## 2021-12-18 DIAGNOSIS — D539 Nutritional anemia, unspecified: Secondary | ICD-10-CM | POA: Diagnosis present

## 2021-12-18 DIAGNOSIS — F1011 Alcohol abuse, in remission: Secondary | ICD-10-CM | POA: Diagnosis present

## 2021-12-18 DIAGNOSIS — E871 Hypo-osmolality and hyponatremia: Secondary | ICD-10-CM | POA: Diagnosis present

## 2021-12-18 DIAGNOSIS — R109 Unspecified abdominal pain: Secondary | ICD-10-CM | POA: Diagnosis present

## 2021-12-18 DIAGNOSIS — D62 Acute posthemorrhagic anemia: Secondary | ICD-10-CM | POA: Diagnosis present

## 2021-12-18 DIAGNOSIS — K7460 Unspecified cirrhosis of liver: Secondary | ICD-10-CM

## 2021-12-18 DIAGNOSIS — K920 Hematemesis: Secondary | ICD-10-CM | POA: Diagnosis present

## 2021-12-18 DIAGNOSIS — E860 Dehydration: Secondary | ICD-10-CM | POA: Diagnosis present

## 2021-12-18 DIAGNOSIS — E1122 Type 2 diabetes mellitus with diabetic chronic kidney disease: Secondary | ICD-10-CM | POA: Diagnosis present

## 2021-12-18 DIAGNOSIS — K7031 Alcoholic cirrhosis of liver with ascites: Secondary | ICD-10-CM | POA: Diagnosis present

## 2021-12-18 DIAGNOSIS — I8510 Secondary esophageal varices without bleeding: Secondary | ICD-10-CM | POA: Diagnosis present

## 2021-12-18 DIAGNOSIS — E1165 Type 2 diabetes mellitus with hyperglycemia: Secondary | ICD-10-CM | POA: Diagnosis present

## 2021-12-18 DIAGNOSIS — K8020 Calculus of gallbladder without cholecystitis without obstruction: Secondary | ICD-10-CM | POA: Diagnosis present

## 2021-12-18 DIAGNOSIS — N179 Acute kidney failure, unspecified: Secondary | ICD-10-CM | POA: Diagnosis present

## 2021-12-18 DIAGNOSIS — Z20822 Contact with and (suspected) exposure to covid-19: Secondary | ICD-10-CM | POA: Diagnosis present

## 2021-12-18 DIAGNOSIS — D689 Coagulation defect, unspecified: Secondary | ICD-10-CM | POA: Diagnosis present

## 2021-12-18 DIAGNOSIS — K3189 Other diseases of stomach and duodenum: Secondary | ICD-10-CM | POA: Diagnosis present

## 2021-12-18 DIAGNOSIS — I81 Portal vein thrombosis: Secondary | ICD-10-CM | POA: Diagnosis present

## 2021-12-18 DIAGNOSIS — K219 Gastro-esophageal reflux disease without esophagitis: Secondary | ICD-10-CM | POA: Diagnosis present

## 2021-12-18 DIAGNOSIS — N182 Chronic kidney disease, stage 2 (mild): Secondary | ICD-10-CM | POA: Diagnosis present

## 2021-12-18 DIAGNOSIS — Z79899 Other long term (current) drug therapy: Secondary | ICD-10-CM

## 2021-12-18 DIAGNOSIS — N189 Chronic kidney disease, unspecified: Secondary | ICD-10-CM

## 2021-12-18 LAB — RESP PANEL BY RT-PCR (FLU A&B, COVID) ARPGX2
Influenza A by PCR: NEGATIVE
Influenza B by PCR: NEGATIVE
SARS Coronavirus 2 by RT PCR: NEGATIVE

## 2021-12-18 LAB — BODY FLUID CELL COUNT WITH DIFFERENTIAL
Eos, Fluid: 0 %
Lymphs, Fluid: 30 %
Monocyte-Macrophage-Serous Fluid: 52 %
Neutrophil Count, Fluid: 18 %
Total Nucleated Cell Count, Fluid: 149 cu mm

## 2021-12-18 LAB — CBC
HCT: 26.3 % — ABNORMAL LOW (ref 39.0–52.0)
Hemoglobin: 9.6 g/dL — ABNORMAL LOW (ref 13.0–17.0)
MCH: 36.8 pg — ABNORMAL HIGH (ref 26.0–34.0)
MCHC: 36.5 g/dL — ABNORMAL HIGH (ref 30.0–36.0)
MCV: 100.8 fL — ABNORMAL HIGH (ref 80.0–100.0)
Platelets: 110 10*3/uL — ABNORMAL LOW (ref 150–400)
RBC: 2.61 MIL/uL — ABNORMAL LOW (ref 4.22–5.81)
RDW: 16.4 % — ABNORMAL HIGH (ref 11.5–15.5)
WBC: 14.6 10*3/uL — ABNORMAL HIGH (ref 4.0–10.5)
nRBC: 0 % (ref 0.0–0.2)

## 2021-12-18 LAB — COMPREHENSIVE METABOLIC PANEL
ALT: 26 U/L (ref 0–44)
AST: 41 U/L (ref 15–41)
Albumin: 2.9 g/dL — ABNORMAL LOW (ref 3.5–5.0)
Alkaline Phosphatase: 164 U/L — ABNORMAL HIGH (ref 38–126)
Anion gap: 6 (ref 5–15)
BUN: 31 mg/dL — ABNORMAL HIGH (ref 6–20)
CO2: 20 mmol/L — ABNORMAL LOW (ref 22–32)
Calcium: 7.9 mg/dL — ABNORMAL LOW (ref 8.9–10.3)
Chloride: 103 mmol/L (ref 98–111)
Creatinine, Ser: 1.52 mg/dL — ABNORMAL HIGH (ref 0.61–1.24)
GFR, Estimated: 54 mL/min — ABNORMAL LOW (ref >60)
Glucose, Bld: 220 mg/dL — ABNORMAL HIGH (ref 70–99)
Potassium: 4.2 mmol/L (ref 3.5–5.1)
Sodium: 129 mmol/L — ABNORMAL LOW (ref 135–145)
Total Bilirubin: 2.6 mg/dL — ABNORMAL HIGH (ref 0.3–1.2)
Total Protein: 6.6 g/dL (ref 6.5–8.1)

## 2021-12-18 LAB — LIPASE, BLOOD: Lipase: 79 U/L — ABNORMAL HIGH (ref 11–51)

## 2021-12-18 LAB — AMMONIA: Ammonia: 25 umol/L (ref 9–35)

## 2021-12-18 LAB — ALBUMIN, PLEURAL OR PERITONEAL FLUID: Albumin, Fluid: <1.5 g/dL

## 2021-12-18 LAB — PROTIME-INR
INR: 1.8 — ABNORMAL HIGH (ref 0.8–1.2)
Prothrombin Time: 21.2 seconds — ABNORMAL HIGH (ref 11.4–15.2)

## 2021-12-18 LAB — TYPE AND SCREEN
ABO/RH(D): O POS
Antibody Screen: NEGATIVE

## 2021-12-18 LAB — PROTEIN, PLEURAL OR PERITONEAL FLUID: Total protein, fluid: <3 g/dL

## 2021-12-18 LAB — LACTATE DEHYDROGENASE: LDH: 142 U/L (ref 98–192)

## 2021-12-18 LAB — LACTATE DEHYDROGENASE, PLEURAL OR PERITONEAL FLUID: LD, Fluid: 31 U/L — ABNORMAL HIGH (ref 3–23)

## 2021-12-18 LAB — APTT: aPTT: 40 seconds — ABNORMAL HIGH (ref 24–36)

## 2021-12-18 LAB — GLUCOSE, CAPILLARY
Glucose-Capillary: 250 mg/dL — ABNORMAL HIGH (ref 70–99)
Glucose-Capillary: 202 mg/dL — ABNORMAL HIGH (ref 70–99)

## 2021-12-18 LAB — CBG MONITORING, ED: Glucose-Capillary: 161 mg/dL — ABNORMAL HIGH (ref 70–99)

## 2021-12-18 LAB — ETHANOL: Alcohol, Ethyl (B): <10 mg/dL (ref <10)

## 2021-12-18 MED ORDER — MORPHINE SULFATE (PF) 2 MG/ML IV SOLN: 2.0000 mg | INTRAVENOUS | Status: DC | PRN

## 2021-12-18 MED ORDER — ENSURE ENLIVE PO LIQD
237.0000 mL | Freq: Three times a day (TID) | ORAL | Status: DC
  Administered 2021-12-18 – 2021-12-21 (×9): 237 mL via ORAL

## 2021-12-18 MED ORDER — INSULIN ASPART 100 UNIT/ML IJ SOLN: 0.0000 [IU] | Freq: Every day | INTRAMUSCULAR | Status: DC

## 2021-12-18 MED ORDER — THIAMINE HCL 100 MG PO TABS
100.0000 mg | ORAL_TABLET | Freq: Every day | ORAL | Status: DC
  Administered 2021-12-18 – 2021-12-21 (×4): 100 mg via ORAL
  Filled 2021-12-18 (×4): qty 1

## 2021-12-18 MED ORDER — INSULIN ASPART 100 UNIT/ML IJ SOLN: 0.0000 [IU] | Freq: Three times a day (TID) | INTRAMUSCULAR | Status: DC

## 2021-12-18 MED ORDER — TAMSULOSIN HCL 0.4 MG PO CAPS
0.4000 mg | ORAL_CAPSULE | Freq: Every day | ORAL | Status: DC
  Administered 2021-12-18 – 2021-12-21 (×4): 0.4 mg via ORAL
  Filled 2021-12-18 (×4): qty 1

## 2021-12-18 MED ORDER — ONDANSETRON HCL 4 MG/2ML IJ SOLN: 4.0000 mg | Freq: Three times a day (TID) | INTRAMUSCULAR | Status: DC | PRN

## 2021-12-18 MED ORDER — INSULIN ASPART 100 UNIT/ML IJ SOLN
0.0000 [IU] | Freq: Three times a day (TID) | INTRAMUSCULAR | Status: DC
  Administered 2021-12-18: 3 [IU] via SUBCUTANEOUS
  Administered 2021-12-19: 10:00:00 1 [IU] via SUBCUTANEOUS
  Administered 2021-12-19: 18:00:00 5 [IU] via SUBCUTANEOUS
  Administered 2021-12-19 – 2021-12-20 (×2): 2 [IU] via SUBCUTANEOUS
  Administered 2021-12-20: 3 [IU] via SUBCUTANEOUS
  Administered 2021-12-20 – 2021-12-21 (×2): 1 [IU] via SUBCUTANEOUS
  Filled 2021-12-18 (×8): qty 1

## 2021-12-18 MED ORDER — SODIUM CHLORIDE 1 G PO TABS
1.0000 g | ORAL_TABLET | Freq: Two times a day (BID) | ORAL | Status: DC
  Administered 2021-12-18 – 2021-12-21 (×6): 1 g via ORAL
  Filled 2021-12-18 (×8): qty 1

## 2021-12-18 MED ORDER — SODIUM CHLORIDE 0.9 % IV BOLUS
500.0000 mL | Freq: Once | INTRAVENOUS | Status: AC
  Administered 2021-12-18: 500 mL via INTRAVENOUS

## 2021-12-18 MED ORDER — SODIUM CHLORIDE 0.9 % IV SOLN
1.0000 g | INTRAVENOUS | Status: DC
  Filled 2021-12-18: qty 10

## 2021-12-18 MED ORDER — INSULIN ASPART 100 UNIT/ML IJ SOLN
0.0000 [IU] | Freq: Every day | INTRAMUSCULAR | Status: DC
  Administered 2021-12-18 – 2021-12-20 (×2): 2 [IU] via SUBCUTANEOUS
  Filled 2021-12-18 (×2): qty 1

## 2021-12-18 MED ORDER — IOHEXOL 300 MG/ML  SOLN
100.0000 mL | Freq: Once | INTRAMUSCULAR | Status: AC | PRN
  Administered 2021-12-18: 100 mL via INTRAVENOUS

## 2021-12-18 MED ORDER — FOLIC ACID 1 MG PO TABS
1.0000 mg | ORAL_TABLET | Freq: Every day | ORAL | Status: DC
  Administered 2021-12-18 – 2021-12-21 (×4): 1 mg via ORAL
  Filled 2021-12-18 (×4): qty 1

## 2021-12-18 MED ORDER — LACTULOSE 10 GM/15ML PO SOLN: 20.0000 g | ORAL | Status: DC | PRN

## 2021-12-18 MED ORDER — PANTOPRAZOLE SODIUM 40 MG PO TBEC
40.0000 mg | DELAYED_RELEASE_TABLET | Freq: Two times a day (BID) | ORAL | Status: DC
  Administered 2021-12-18 – 2021-12-21 (×7): 40 mg via ORAL
  Filled 2021-12-18 (×7): qty 1

## 2021-12-18 MED ORDER — ALBUMIN HUMAN 25 % IV SOLN
25.0000 g | Freq: Once | INTRAVENOUS | Status: AC
  Administered 2021-12-18: 25 g via INTRAVENOUS
  Filled 2021-12-18 (×2): qty 100

## 2021-12-18 MED ORDER — ONDANSETRON HCL 4 MG/2ML IJ SOLN
4.0000 mg | Freq: Once | INTRAMUSCULAR | Status: AC
  Administered 2021-12-18: 4 mg via INTRAVENOUS
  Filled 2021-12-18: qty 2

## 2021-12-18 MED ORDER — FENTANYL CITRATE PF 50 MCG/ML IJ SOSY
50.0000 ug | PREFILLED_SYRINGE | Freq: Once | INTRAMUSCULAR | Status: AC
  Administered 2021-12-18: 50 ug via INTRAVENOUS
  Filled 2021-12-18: qty 1

## 2021-12-18 NOTE — ED Provider Notes (Signed)
[facility] Provider Note    Event Date/Time   First MD Initiated Contact with Patient 12/18/21 [phone]     (approximate)   History   Hematemesis   HPI  Lytle Marinacci is a 56 y.o. male  CKD stage IIIa, alcoholic liver cirrhosis, portal hypertension, ascites who comes in with concerns for abdominal pain.  Patient reports eating some fish last night.  He said he woke up at 2 AM with diffuse abdominal pain nausea, vomiting.  He states that it was just when TPN.  He denies any blood in his vomit.  His daughter later came to the room and confirmed there was never any blood in the vomit.  He denies any dark black stools.  States he feels little constipated more than anything.  Denies any fevers.  Denies any shortness of breath, chest pain.  Patient denies any EtOH use but then starts to get combative and upset that I asked him  On review of records patient was admitted to the hospital and recently discharged on 12/14/2021.  On 1/20 he had had 6 L of fluid removed with paracentesis.  He was also recently here for the amount of emesis and required 5 units of blood.  He had banding done of his varices for this.   Physical Exam   Triage Vital Signs: ED Triage Vitals  Enc Vitals Group     BP 12/18/21 0614 97/63     Pulse Rate 12/18/21 0614 66     Resp 12/18/21 0614 18     Temp 12/18/21 0614 98 F (36.7 C)     Temp Source 12/18/21 0614 Oral     SpO2 12/18/21 0614 100 %     Weight 12/18/21 0611 171 lb 15.3 oz (78 kg)     Height 12/18/21 0611 5\' 5"  (1.651 m)     Head Circumference --      Peak Flow --      Pain Score --      Pain Loc --      Pain Edu? --      Excl. in [place]? --     Most recent vital signs: Vitals:   12/18/21 0614  BP: 97/63  Pulse: 66  Resp: 18  Temp: 98 F (36.7 C)  SpO2: 100%     General: Awake, no distress.  CV:  Good peripheral perfusion.  Resp:  Normal effort.  Abd:  No distention.  Tender throughout his abdomen Other:  No swelling  noted in his legs.  No calf tenderness.   ED Results / Procedures / Treatments   Labs (all labs ordered are listed, but only abnormal results are displayed) Labs Reviewed  COMPREHENSIVE METABOLIC PANEL - Abnormal; Notable for the following components:      Result Value   Sodium 129 (*)    CO2 20 (*)    Glucose, Bld 220 (*)    BUN 31 (*)    Creatinine, Ser 1.52 (*)    Calcium 7.9 (*)    Albumin 2.9 (*)    Alkaline Phosphatase 164 (*)    Total Bilirubin 2.6 (*)    GFR, Estimated 54 (*)    All other components within normal limits  CBC - Abnormal; Notable for the following components:   WBC 14.6 (*)    RBC 2.61 (*)    Hemoglobin 9.6 (*)    HCT 26.3 (*)    MCV 100.8 (*)    MCH 36.8 (*)    MCHC  36.5 (*)    RDW 16.4 (*)    Platelets 110 (*)    All other components within normal limits  LIPASE, BLOOD - Abnormal; Notable for the following components:   Lipase 79 (*)    All other components within normal limits  RESP PANEL BY RT-PCR (FLU A&B, COVID) ARPGX2  URINALYSIS, ROUTINE W REFLEX MICROSCOPIC  PROTIME-INR  ETHANOL  TYPE AND SCREEN     EKG  My interpretation of EKG: Sinus bradycardia rate of 46 without any ST elevation or T wave inversions, does have a little bit of artifact on it   RADIOLOGY I have reviewed the xray personally and agree with radiology read   PROCEDURES:  Critical Care performed: No  .1-3 Lead EKG Interpretation Performed by: Vanessa [place], MD Authorized by: Vanessa [place], MD     Interpretation: abnormal     ECG rate:  50-60s   ECG rate assessment: bradycardic     Rhythm: sinus bradycardia     Ectopy: none     Conduction: normal     MEDICATIONS ORDERED IN ED: Medications  sodium chloride 0.9 % bolus 500 mL (has no administration in time range)  ondansetron (ZOFRAN) injection 4 mg (has no administration in time range)     IMPRESSION / MDM / ASSESSMENT AND PLAN / ED COURSE  I reviewed the triage vital signs and the nursing  notes.                             Patient with complex medical history including cirrhosis with frequent admissions who now comes in with abdominal pain and nausea.  Patient is not having any hematemesis.  Will get CT scan to evaluate for SBO, perforation, appendicitis, diverticulitis.  Will get ultrasound to evaluate for ascites/portal vein thrombosis, cholecystitis.  We will give patient 1 dose of pain medicine and 1 dose of Zofran and a little bit of fluid  Ethanol negative Kidney function around baseline  White count high but no other SIRS criteria holding off on blood culture Hemoglobin stable  Lipase elevated but non specific   CT scan with cholelithiasis but ultrasound does not show any evidence of cholecystitis.  His got cirrhosis with large volume ascites and nonocclusive main portal vein thrombus.  Given his nonocclusive but do not recommend anticoagulation given recent GI bleed and the ultrasound shows good flow at this time.  I discussed the case with IR Dr. Maryelizabeth Kaufmann who can get him in for IR guided drainage today.  We will hold off on doing a bedside diagnostic and given patient is otherwise stable and will be able to get cell count and culture this afternoon.  I happens about the possibility of SBP given his slightly elevated white count but given he is stable we will hold off on antibiotics till after IR is done with her drainage.  We will discussed the hospital team for his decompensated cirrhosis with worsening ascites  The patient is on the cardiac monitor to evaluate for evidence of arrhythmia and/or significant heart rate changes.  FINAL CLINICAL IMPRESSION(S) / ED DIAGNOSES   Final diagnoses:  Abdominal pain  Cirrhosis of liver with ascites, unspecified hepatic cirrhosis type ([place])  Chronic kidney disease, unspecified CKD stage     Rx / DC Orders   ED Discharge Orders     None        Note:  This document was prepared using Dragon voice recognition software  and may include unintentional dictation errors.   Vanessa Crowley, MD 12/18/21 1102

## 2021-12-18 NOTE — Procedures (Signed)
° °  US guided RLQ paracentesis 3.9 L yellow fluid Sent for labs per MD  Tolerated well  EBL: 1 cc

## 2021-12-18 NOTE — H&P (Signed)
History and Physical    Shawn Torres [id number] DOB: 1966/09/28 DOA: 12/18/2021  Referring MD/NP/PA:   PCP: Caryl Never, MD   Patient coming from:  The patient is coming from home.  At baseline, pt is independent for most of ADL.        Chief Complaint: Abdominal pain  HPI: Shawn Torres is a 56 y.o. 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  Patient speaks Spanish, history is obtained with iPad interpreter's help  Patient was recently hospitalized from 1/21-1/24 due to upper GI bleeding. EGD showed portal hypertensive gastropathy and grade 2 esophageal varices that were completely eradicated and banded.  He was transfused with 5 units of  blood cells for acute blood loss anemia.  States that he developed abdominal pain since yesterday, which is diffuse, constant, moderate, sharp, nonradiating.  He also has worsening abdominal distention.  Associated with nausea and multiple episodes of nonbilious nonbloody vomiting.  Patient is very sure that he does not have hematemesis today.  Denies diarrhea.  No fever or chills.  Patient does not have chest pain, cough, shortness breath.  No symptoms of UTI.  Patient states that he did not drink any alcohol recently.  ED Course: pt was found to have WBC 14.6, INR 1.8, hemoglobin 9.6 (8.6 on 12/14/2021), negative COVID PCR, slightly worsening renal function, liver function (ALP 164, AST 41, ALT 26, total bilirubin 2.6). Temperature normal, blood pressure 116/68, heart rate 48-66, RR 16, oxygen saturation 100% on room air.  Patient is admitted to [facility] bed as inpatient.  CT-abd/pelvis: 1. Cirrhosis with large volume ascites and nonocclusive main portal vein thrombus. 2. Cholelithiasis. Full gallbladder but no focal inflammatory changes. 3.   Periesophageal varices. 4. Premature atherosclerosis including the coronary arteries  US-RUQ 1. No acute findings.  Normal gallbladder.  No bile duct dilation. 2. Findings consistent with cirrhosis and portal venous hypertension, the latter reflected by moderate ascites. No liver mass.  EKG: I have personally reviewed.  Sinus rhythm, QTC 446, LAD.   Review of Systems:   General: no fevers, chills, no body weight gain,  has fatigue HEENT: no blurry vision, hearing changes or sore throat Respiratory: no dyspnea, coughing, wheezing CV: no chest pain, no palpitations GI: has nausea, vomiting, abdominal pain, no diarrhea, constipation.  Has abdominal distention GU: no dysuria, burning on urination, increased urinary frequency, hematuria  Ext: no leg edema Neuro: no unilateral weakness, numbness, or tingling, no vision change or hearing loss Skin: no rash, no skin tear. MSK: No muscle spasm, no deformity, no limitation of range of movement in spin Heme: No easy bruising.  Travel history: No recent long distant travel.   Allergy: No Known Allergies  Past Medical History:  Diagnosis Date   Cirrhosis, alcoholic ([place])     Past Surgical History:  Procedure Laterality Date   ESOPHAGOGASTRODUODENOSCOPY N/A 10/10/2021   Procedure: ESOPHAGOGASTRODUODENOSCOPY (EGD);  Surgeon: [name], MD;  Location: [facility] ENDOSCOPY;  Service: Endoscopy;  Laterality: N/A;   ESOPHAGOGASTRODUODENOSCOPY (EGD) WITH PROPOFOL N/A 12/11/2021   Procedure: ESOPHAGOGASTRODUODENOSCOPY (EGD) WITH PROPOFOL;  Surgeon: Annamaria Helling, DO;  Location: [facility];  Service: Gastroenterology;  Laterality: N/A;    Social History:  reports that he has never smoked. He has never used smokeless tobacco. He reports current alcohol use. He reports that he does not use drugs.  Family  History:  Family History  Family history unknown: Yes     Prior to Admission medications   Medication Sig Start Date End Date  Taking? Authorizing Provider  carvedilol (COREG) 6.25 MG tablet Take by mouth. 11/22/21 12/22/21  [provider]  ciprofloxacin (CIPRO) 500 MG tablet Take 1 tablet (500 mg total) by mouth 2 (two) times daily for 4 days. 12/14/21 12/18/21  Jennye Boroughs, MD  feeding supplement (ENSURE ENLIVE / ENSURE PLUS) LIQD Take 237 mLs by mouth 3 (three) times daily between meals. 11/03/21   Ezekiel Slocumb, DO  folic acid (FOLVITE) 1 MG tablet Take 1 tablet by mouth daily. 09/03/21 09/03/22  [provider]  furosemide (LASIX) 40 MG tablet Take 1 tablet (40 mg total) by mouth daily. 11/04/21   Ezekiel Slocumb, DO  lactulose (CHRONULAC) 10 GM/15ML solution Take by mouth. 11/22/21 12/22/21  [provider]  Nutritional Supplements (,FEEDING SUPPLEMENT, PROSOURCE PLUS) liquid Take 30 mLs by mouth 2 (two) times daily between meals. 11/03/21   Ezekiel Slocumb, DO  pantoprazole (PROTONIX) 40 MG tablet Take 1 tablet (40 mg total) by mouth 2 (two) times daily. 12/14/21   Jennye Boroughs, MD  rifaximin (XIFAXAN) 550 MG TABS tablet Take 1 tablet (550 mg total) by mouth 2 (two) times daily. 12/14/21 01/13/22  Jennye Boroughs, MD  spironolactone (ALDACTONE) 100 MG tablet Take 1 tablet (100 mg total) by mouth daily. 11/04/21   Ezekiel Slocumb, DO  tamsulosin (FLOMAX) 0.4 MG CAPS capsule Take 1 capsule (0.4 mg total) by mouth daily. 10/17/21   Richarda Osmond, MD  thiamine 100 MG tablet Take 1 tablet (100 mg total) by mouth daily. 10/16/21   Richarda Osmond, MD    Physical Exam: Vitals:   12/18/21 1300 12/18/21 1301 12/18/21 1330 12/18/21 1457  BP: 109/72 109/72 110/69 101/62  Pulse: (!) 54 (!) 51 (!) 53   Resp: 12 12 13    Temp:      TempSrc:      SpO2: 100% 100% 100%   Weight:      Height:       General: Not in acute distress HEENT:       Eyes: PERRL, EOMI, no scleral icterus.       ENT: No discharge from the ears and nose, no pharynx injection, no tonsillar enlargement.         Neck: No JVD, no bruit, no mass felt. Heme: No neck lymph node enlargement. Cardiac: S1/S2, RRR, No murmurs, No gallops or rubs. Respiratory: No rales, wheezing, rhonchi or rubs. GI: Soft, distended, has mild diffused tenderness, no rebound pain, no organomegaly, BS present. GU: No hematuria Ext: No pitting leg edema bilaterally. 1+DP/PT pulse bilaterally. Musculoskeletal: No joint deformities, No joint redness or warmth, no limitation of ROM in spin. Skin: No rashes.  Neuro: Alert, oriented X3, cranial nerves II-XII grossly intact, moves all extremities normally.  Psych: Patient is not psychotic, no suicidal or hemocidal ideation.  Labs on Admission: I have personally reviewed following labs and imaging studies  CBC: Recent Labs  Lab 12/13/21 0133 12/13/21 0550 12/13/21 1204 12/14/21 0451 12/18/21 0615  WBC  --  13.9*  --  11.7* 14.6*  NEUTROABS  --  11.7*  --  8.2*  --   HGB 7.3* 7.8* 6.8* 8.6* 9.6*  HCT 19.6* 21.8* 19.0* 23.8* 26.3*  MCV  --  92.0  --  89.8 100.8*  PLT  --  117*  --  111* 110*  Basic Metabolic Panel: Recent Labs  Lab 12/12/21 0554 12/13/21 0550 12/14/21 0451 12/18/21 0615  NA 136 132* 130* 129*  K 4.9 5.2* 5.1 4.2  CL 109 109 105 103  CO2 14* 17* 20* 20*  GLUCOSE 180* 179* 215* 220*  BUN 73* 64* 47* 31*  CREATININE 1.86* 1.63* 1.27* 1.52*  CALCIUM 8.1* 8.0* 7.8* 7.9*   GFR: Estimated Creatinine Clearance: 54.1 mL/min (A) (by C-G formula based on SCr of 1.52 mg/dL (H)). Liver Function Tests: Recent Labs  Lab 12/18/21 0615  AST 41  ALT 26  ALKPHOS 164*  BILITOT 2.6*  PROT 6.6  ALBUMIN 2.9*   Recent Labs  Lab 12/18/21 0615  LIPASE 79*   No results for input(s): AMMONIA in the last 168 hours. Coagulation Profile: Recent Labs  Lab 12/18/21 0754  INR 1.8*   Cardiac Enzymes: No results for input(s): CKTOTAL, CKMB, CKMBINDEX, TROPONINI in the last 168 hours. BNP (last 3 results) No results for input(s): PROBNP in the last 8760  hours. HbA1C: No results for input(s): HGBA1C in the last 72 hours. CBG: Recent Labs  Lab 12/13/21 1624 12/13/21 2042 12/14/21 0755 12/14/21 1212 12/18/21 1258  GLUCAP 168* 192* 245* 182* 161*   Lipid Profile: No results for input(s): CHOL, HDL, LDLCALC, TRIG, CHOLHDL, LDLDIRECT in the last 72 hours. Thyroid Function Tests: No results for input(s): TSH, T4TOTAL, FREET4, T3FREE, THYROIDAB in the last 72 hours. Anemia Panel: No results for input(s): VITAMINB12, FOLATE, FERRITIN, TIBC, IRON, RETICCTPCT in the last 72 hours. Urine analysis:    Component Value Date/Time   COLORURINE AMBER (A) 04/28/2021 1736   APPEARANCEUR CLEAR (A) 04/28/2021 1736   LABSPEC 1.012 04/28/2021 1736   PHURINE 6.0 04/28/2021 1736   GLUCOSEU NEGATIVE 04/28/2021 1736   HGBUR MODERATE (A) 04/28/2021 1736   BILIRUBINUR NEGATIVE 04/28/2021 1736   KETONESUR NEGATIVE 04/28/2021 1736   PROTEINUR 30 (A) 04/28/2021 1736   NITRITE NEGATIVE 04/28/2021 1736   LEUKOCYTESUR NEGATIVE 04/28/2021 1736   Sepsis Labs: @LABRCNTIP (procalcitonin:4,lacticidven:4) ) Recent Results (from the past 240 hour(s))  Resp Panel by RT-PCR (Flu A&B, Covid) Nasopharyngeal Swab     Status: None   Collection Time: 12/11/21 12:57 PM   Specimen: Nasopharyngeal Swab; Nasopharyngeal(NP) swabs in vial transport medium  Result Value Ref Range Status   SARS Coronavirus 2 by RT PCR NEGATIVE NEGATIVE Final    Comment: (NOTE) SARS-CoV-2 target nucleic acids are NOT DETECTED.  The SARS-CoV-2 RNA is generally detectable in upper respiratory specimens during the acute phase of infection. The lowest concentration of SARS-CoV-2 viral copies this assay can detect is 138 copies/mL. A negative result does not preclude SARS-Cov-2 infection and should not be used as the sole basis for treatment or other patient management decisions. A negative result may occur with  improper specimen collection/handling, submission of specimen other than  nasopharyngeal swab, presence of viral mutation(s) within the areas targeted by this assay, and inadequate number of viral copies(<138 copies/mL). A negative result must be combined with clinical observations, patient history, and epidemiological information. The expected result is Negative.  Fact Sheet for Patients:  EntrepreneurPulse.com.au  Fact Sheet for Healthcare Providers:  IncredibleEmployment.be  This test is no t yet approved or cleared by the [place] FDA and  has been authorized for detection and/or diagnosis of SARS-CoV-2 by FDA under an Emergency Use Authorization (EUA). This EUA will remain  in effect (meaning this test can be used) for the duration of the COVID-19 declaration under Section 564(b)(1) of the Act,  21 U.S.C.section 360bbb-3(b)(1), unless the authorization is terminated  or revoked sooner.       Influenza A by PCR NEGATIVE NEGATIVE Final   Influenza B by PCR NEGATIVE NEGATIVE Final    Comment: (NOTE) The Xpert Xpress SARS-CoV-2/FLU/RSV plus assay is intended as an aid in the diagnosis of influenza from Nasopharyngeal swab specimens and should not be used as a sole basis for treatment. Nasal washings and aspirates are unacceptable for Xpert Xpress SARS-CoV-2/FLU/RSV testing.  Fact Sheet for Patients: EntrepreneurPulse.com.au  Fact Sheet for Healthcare Providers: IncredibleEmployment.be  This test is not yet approved or cleared by the [place] FDA and has been authorized for detection and/or diagnosis of SARS-CoV-2 by FDA under an Emergency Use Authorization (EUA). This EUA will remain in effect (meaning this test can be used) for the duration of the COVID-19 declaration under Section 564(b)(1) of the Act, 21 U.S.C. section 360bbb-3(b)(1), unless the authorization is terminated or revoked.  Performed at [facility], [address] [place], [place]  [address]   MRSA Next Gen by PCR, Nasal     Status: None   Collection Time: 12/11/21  5:25 PM   Specimen: Nasal Mucosa; Nasal Swab  Result Value Ref Range Status   MRSA by PCR Next Gen NOT DETECTED NOT DETECTED Final    Comment: (NOTE) The GeneXpert MRSA Assay (FDA approved for NASAL specimens only), is one component of a comprehensive MRSA colonization surveillance program. It is not intended to diagnose MRSA infection nor to guide or monitor treatment for MRSA infections. Test performance is not FDA approved in patients less than 63 years old. Performed at [facility], [address] [place], [place] [address]   Resp Panel by RT-PCR (Flu A&B, Covid) Nasopharyngeal Swab     Status: None   Collection Time: 12/18/21  7:54 AM   Specimen: Nasopharyngeal Swab; Nasopharyngeal(NP) swabs in vial transport medium  Result Value Ref Range Status   SARS Coronavirus 2 by RT PCR NEGATIVE NEGATIVE Final    Comment: (NOTE) SARS-CoV-2 target nucleic acids are NOT DETECTED.  The SARS-CoV-2 RNA is generally detectable in upper respiratory specimens during the acute phase of infection. The lowest concentration of SARS-CoV-2 viral copies this assay can detect is 138 copies/mL. A negative result does not preclude SARS-Cov-2 infection and should not be used as the sole basis for treatment or other patient management decisions. A negative result may occur with  improper specimen collection/handling, submission of specimen other than nasopharyngeal swab, presence of viral mutation(s) within the areas targeted by this assay, and inadequate number of viral copies(<138 copies/mL). A negative result must be combined with clinical observations, patient history, and epidemiological information. The expected result is Negative.  Fact Sheet for Patients:  EntrepreneurPulse.com.au  Fact Sheet for Healthcare Providers:  IncredibleEmployment.be  This test is no t  yet approved or cleared by the [place] FDA and  has been authorized for detection and/or diagnosis of SARS-CoV-2 by FDA under an Emergency Use Authorization (EUA). This EUA will remain  in effect (meaning this test can be used) for the duration of the COVID-19 declaration under Section 564(b)(1) of the Act, 21 U.S.C.section 360bbb-3(b)(1), unless the authorization is terminated  or revoked sooner.       Influenza A by PCR NEGATIVE NEGATIVE Final   Influenza B by PCR NEGATIVE NEGATIVE Final    Comment: (NOTE) The Xpert Xpress SARS-CoV-2/FLU/RSV plus assay is intended as an aid in the diagnosis of influenza from Nasopharyngeal swab specimens and  should not be used as a sole basis for treatment. Nasal washings and aspirates are unacceptable for Xpert Xpress SARS-CoV-2/FLU/RSV testing.  Fact Sheet for Patients: EntrepreneurPulse.com.au  Fact Sheet for Healthcare Providers: IncredibleEmployment.be  This test is not yet approved or cleared by the [place] FDA and has been authorized for detection and/or diagnosis of SARS-CoV-2 by FDA under an Emergency Use Authorization (EUA). This EUA will remain in effect (meaning this test can be used) for the duration of the COVID-19 declaration under Section 564(b)(1) of the Act, 21 U.S.C. section 360bbb-3(b)(1), unless the authorization is terminated or revoked.  Performed at [facility], [address] [place], [place] [address]      Radiological Exams on Admission: CT ABDOMEN PELVIS W CONTRAST  Result Date: 12/18/2021 CLINICAL DATA:  Acute, nonlocalized abdominal pain. Vomiting with abdominal pain and swelling EXAM: CT ABDOMEN AND PELVIS WITH CONTRAST TECHNIQUE: Multidetector CT imaging of the abdomen and pelvis was performed using the standard protocol following bolus administration of intravenous contrast. RADIATION DOSE REDUCTION: This exam was performed according to the  departmental dose-optimization program which includes automated exposure control, adjustment of the mA and/or kV according to patient size and/or use of iterative reconstruction technique. CONTRAST:  166mL OMNIPAQUE IOHEXOL 300 MG/ML  SOLN COMPARISON:  04/28/2021 FINDINGS: Lower chest: Lower periesophageal varices. Premature atheromatous calcification of the coronaries Hepatobiliary: Known cirrhosis with large caudate lobe and surface lobulation. Heterogeneous perfusion/fat deposition. No focal mass is seen.Large volume ascites, simple and diffusely distributed, less tense than before. 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 Cholelithiasis. Full gallbladder but no detected inflammatory changes are superimposed. Pancreas: Question pancreas divisum. On axial slices, low-density at the upper pancreatic body is volume averaged fat based on reformats. Spleen: Unremarkable Adrenals/Urinary Tract: Negative adrenals. No hydronephrosis or stone. Unremarkable bladder. Stomach/Bowel:  No obstruction. No appendicitis. Vascular/Lymphatic: Nonocclusive thrombus in the lower main portal vein. No mass or adenopathy. Reproductive:No pathologic findings. Other: No ascites or pneumoperitoneum. Musculoskeletal: No acute abnormalities. IMPRESSION: 1. Cirrhosis with large volume ascites and nonocclusive main portal vein thrombus. 2. Cholelithiasis. Full gallbladder but no focal inflammatory changes. 3.  Periesophageal varices. 4. Premature atherosclerosis including the coronary arteries. Electronically Signed   By: Jorje Guild M.D.   On: 12/18/2021 10:04   US ABDOMEN LIMITED RUQ (LIVER/GB)  Result Date: 12/18/2021 CLINICAL DATA:  Abdominal pain. EXAM: ULTRASOUND ABDOMEN LIMITED RIGHT UPPER QUADRANT COMPARISON:  None. FINDINGS: Gallbladder: No gallstones or wall thickening visualized. No sonographic Murphy sign noted by sonographer. Common bile duct: Diameter: 4 mm Liver: Decreased liver size. Coarsened echotexture and  overall increased echogenicity. Surface nodularity. No defined mass. Portal vein is patent on color Doppler imaging with normal direction of blood flow towards the liver. Other: Moderate ascites. IMPRESSION: 1. No acute findings.  Normal gallbladder.  No bile duct dilation. 2. Findings consistent with cirrhosis and portal venous hypertension, the latter reflected by moderate ascites. No liver mass. Electronically Signed   By: Lajean Manes M.D.   On: 12/18/2021 09:59      Assessment/Plan Principal Problem:   Abdominal pain Active Problems:   Hyponatremia   Alcoholic cirrhosis of liver with ascites (HCC)   Thrombocytopenia (HCC)   Type 2 diabetes mellitus, without long-term current use of insulin (HCC)   Stage 3a chronic kidney disease (CKD) (HCC)   Macrocytic anemia   GERD (gastroesophageal reflux disease)   BPH (benign prostatic hyperplasia)   Portal vein thrombosis   Abdominal pain: Etiology is not clear. Patient has diffuse  abdominal pain and worsening ascites, with leukocytosis WBC 14.6, suspecting possible SBP.   CT scan showed nonocclusive main portal vein thrombus which may have contributed partially to  abdominal pain.  CT scan also showed cholelithiasis, but right upper quadrant ultrasound is negative.  Liver function is not impressive except for total bilirubin 2.6, low suspicions for cholecystitis.  IR is consulted for paracentesis, which was done, with 3.9 L of fluid removed  -Admitted to [facility] bed as inpatient -Start IV Rocephin empirically -Pending peritoneal fluid analysis -Blood culture -As needed morphine and Zofran -IV fluid: 500 cc normal saline -give 25 g of abumine  Nonocclusive portal vein thrombosis: Since patient has very high risk of GI bleeding, will not start anticoagulants. -Observe closely  Told sodium 129 -Hold Lasix and spironolactone -Fluid restriction -Sodium chloride tablet 1 g twice daily  Alcoholic cirrhosis of liver with ascites ([place]):  Mental status normal.  Patient stopped drinking alcohol -Check INR, PTT -Check ammonia level -Hold rifaximin since patient is on Rocephin currently  Thrombocytopenia [facility]): platelet 110, likely due to liver cirrhosis -Follow-up with CBC  Diet controlled Type 2 diabetes mellitus, without long-term current use of insulin ([place]): Recent A1c 5.5, well controlled.  Patient not taking medications. Blood sugar 220 -Sliding scale insulin  Stage 3a chronic kidney disease (CKD) ([place]): Worsening than baseline.  Recent baseline creatinine 1.27 on 12/14/2021.  His creatinine is 1.52, BUN 31, likely due to dehydration and continuation of diuretics -f/u with BMP -Hold Lasix and spironolactone -IV fluid: 500 cc normal saline was given in ED  Macrocytic anemia: Hemoglobin stable -Follow-up with CBC  GERD (gastroesophageal reflux disease) -Protonix  BPH (benign prostatic hyperplasia) -Flomax      DVT ppx: SCD Code Status: Full code Family Communication: not done, no family member is at bed side.              Disposition Plan:  Anticipate discharge back to previous environment Consults called: EDP consulted  Dr. 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 Now presents with worsening ascites and abdominal pain.  Suspecting SBP.  Patient will need paracentesis.  His presentation is highly complicated.  He is at high risk of deteriorating, will need to be treated in hospital for at least 2 days.         Date of Service 12/18/2021    [facility]   If 7PM-7AM, please contact night-coverage [email or web address] 12/18/2021, 3:01 PM

## 2021-12-18 NOTE — ED Triage Notes (Signed)
Information obtained with assist of Vladimir Faster spanish interpreter # [phone]. Pt states he has been vomiting  and having abd pain and swelling. Per ems pt with history of cirrhosis.

## 2021-12-19 ENCOUNTER — Encounter: Payer: Self-pay | Admitting: Internal Medicine

## 2021-12-19 LAB — GLUCOSE, CAPILLARY
Glucose-Capillary: 136 mg/dL — ABNORMAL HIGH (ref 70–99)
Glucose-Capillary: 200 mg/dL — ABNORMAL HIGH (ref 70–99)
Glucose-Capillary: 294 mg/dL — ABNORMAL HIGH (ref 70–99)
Glucose-Capillary: 143 mg/dL — ABNORMAL HIGH (ref 70–99)

## 2021-12-19 LAB — BASIC METABOLIC PANEL
Anion gap: 5 (ref 5–15)
BUN: 37 mg/dL — ABNORMAL HIGH (ref 6–20)
CO2: 18 mmol/L — ABNORMAL LOW (ref 22–32)
Calcium: 8 mg/dL — ABNORMAL LOW (ref 8.9–10.3)
Chloride: 103 mmol/L (ref 98–111)
Creatinine, Ser: 1.38 mg/dL — ABNORMAL HIGH (ref 0.61–1.24)
GFR, Estimated: >60 mL/min (ref >60)
Glucose, Bld: 201 mg/dL — ABNORMAL HIGH (ref 70–99)
Potassium: 4.9 mmol/L (ref 3.5–5.1)
Sodium: 126 mmol/L — ABNORMAL LOW (ref 135–145)

## 2021-12-19 LAB — CBC
HCT: 24.1 % — ABNORMAL LOW (ref 39.0–52.0)
Hemoglobin: 8.4 g/dL — ABNORMAL LOW (ref 13.0–17.0)
MCH: 32.8 pg (ref 26.0–34.0)
MCHC: 34.9 g/dL (ref 30.0–36.0)
MCV: 94.1 fL (ref 80.0–100.0)
Platelets: 102 10*3/uL — ABNORMAL LOW (ref 150–400)
RBC: 2.56 MIL/uL — ABNORMAL LOW (ref 4.22–5.81)
RDW: 16 % — ABNORMAL HIGH (ref 11.5–15.5)
WBC: 10.3 10*3/uL (ref 4.0–10.5)
nRBC: 0 % (ref 0.0–0.2)

## 2021-12-19 MED ORDER — SPIRONOLACTONE 25 MG PO TABS
100.0000 mg | ORAL_TABLET | Freq: Every day | ORAL | Status: DC
  Administered 2021-12-19: 10:00:00 100 mg via ORAL
  Filled 2021-12-19: qty 4

## 2021-12-19 MED ORDER — FUROSEMIDE 40 MG PO TABS
40.0000 mg | ORAL_TABLET | Freq: Every day | ORAL | Status: DC
  Administered 2021-12-19: 10:00:00 40 mg via ORAL
  Filled 2021-12-19: qty 1

## 2021-12-19 MED ORDER — RIFAXIMIN 550 MG PO TABS
550.0000 mg | ORAL_TABLET | Freq: Two times a day (BID) | ORAL | Status: DC
  Administered 2021-12-19 – 2021-12-21 (×5): 550 mg via ORAL
  Filled 2021-12-19 (×5): qty 1

## 2021-12-19 NOTE — Care Management (Signed)
°  Transition of Care [facility]) Screening Note   Patient Details  Name: Shawn Torres Date of Birth: 28-Oct-1966   Transition of Care [facility]) CM/SW Contact:    Caryn Section, RN Phone Number: 12/19/2021, 4:02 PM  TOC spoke with patient using virtual spanish interpreter [phone]   Transition of Care Department [facility]) has reviewed patient and no TOC needs have been identified at this time. We will continue to monitor patient advancement through interdisciplinary progression rounds. If new patient transition needs arise, please place a TOC consult.

## 2021-12-19 NOTE — Progress Notes (Signed)
PROGRESS NOTE    Shawn Torres  [id number] DOB: 03-20-1966 DOA: 12/18/2021 PCP: Shawn Never, MD   Chief Complaint  Patient presents with   Hematemesis  Brief Narrative/Hospital Course: Shawn Torres, 56 y.o. 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 CT I nED-  cirrhosis, non occlusive main portal vein thrombosis, cholelithiasis, large volume ascites. Labs WBC 14.6, ? SBP, started Rocephin empirically. S/P paracentesis 3.9 liters.   Subjective: Seen examined Used video interpretor Abd pain is better now No complaints Overnight no fever, blood pressure stable on room air tannates   Assessment & Plan:  Abdominal pain 2/2 cirrhosis Decompensated alcoholic liver cirrhosis with large volume ascites portal hypertension, esophageal varices coagulopathy ( inr 1.8): S/P paracentesis 3.9l.  Total nucleated cell 149 with neutrophils 18%, gram stain no organism, no e/o SBP.  Patient was placed on empiric ceftriaxone admission-due to cirrhosis liver.We will resume patient's diuretic Lasix Aldactone - may need higher dose of lasix. S/P ALBUMIN.  Monitor PT/INR ammonia and hemoglobin  Leukocytosis:resolved.  Unclear etiology could be reactive.  Hyponatremia acute on chronic , was at 130 on d/c.due to liver cirrhosis cont salt tab  Chronic thrombocytopenia in the setting of liver cirrhosis.  Monitor Recent Labs  Lab 12/13/21 0550 12/14/21 0451 12/18/21 0615 12/19/21 0456  PLT 117* 111* 110* 102*      T2DM: Diet controlled last A1c 5.5, blood sugar borderline, continue SSI Recent Labs  Lab 12/14/21 1212 12/18/21 1258 12/18/21 1722 12/18/21 2059 12/19/21 0908  GLUCAP 182* 161* 250* 202* 136*      CKD IIIa: Baseline creatinine 1.2-1.5.  Stable, resume diuretics and monitor Recent Labs  Lab 12/13/21 0550 12/14/21 0451 12/18/21 0615 12/19/21 0456  BUN 64* 47* 31* 37*  CREATININE 1.63* 1.27* 1.52* 1.38*     Macrocytic anemia Anemia of chronic disease with recent GI bleeding: Hemoglobin is stable.  Monitor Recent Labs  Lab 12/13/21 0550 12/13/21 1204 12/14/21 0451 12/18/21 0615 12/19/21 0456  HGB 7.8* 6.8* 8.6* 9.6* 8.4*  HCT 21.8* 19.0* 23.8* 26.3* 24.1*     GERD: On PPI  BPH: Continue Flomax  Portal vein thrombosis: Not a candidate for anticoagulation given patient's  varices, gibleeding and anemia  Goals of care /debility: PT OT eval, overall prognosis guarded  DVT prophylaxis: SCDs Start: 12/18/21 1132 Code Status:   Code Status: Full Code Family Communication: plan of care discussed with patient at bedside. Status is: Inpatient Remains inpatient appropriate because: for ongoing management of ascites Disposition: Currently not  medically stable for discharge. Anticipated Disposition: Home  with daughter tomorrow if stable  Total time spent in the care of this patient 78 MINUTES Objective: Vitals last 24 hrs: Vitals:   12/18/21 1509 12/18/21 1550 12/19/21 0546 12/19/21 0906  BP:  105/62 109/65 115/74  Pulse: (!) 56 64 65 70  Resp: 12 18 16 18   Temp: 98 F (36.7 C) 97.9 F (36.6 C) 98.7 F (37.1 C) 98.8 F (37.1 C)  TempSrc: Oral Oral Oral   SpO2: 100% 100% 99% 100%  Weight:      Height:       Weight change:   Intake/Output Summary (Last 24 hours) at 12/19/2021 [id number] Last data filed at 12/18/2021 1828 Gross per 24 hour  Intake 749.98 ml  Output  50 ml  Net 699.98 ml   Net IO Since Admission: 699.98 mL [12/19/21 0927]   Physical Examination: General exam: AA0x3, weak,older than stated age. HEENT:Oral mucosa moist, Ear/Nose WNL grossly,dentition normal. Respiratory system: B/l clear BS, no use of accessory muscle, non tender. Cardiovascular  system: S1 & S2 +,No JVD. Gastrointestinal system: Abdomen soft, mildly distended BS+. Nervous System:Alert, awake, moving extremities. Extremities: edema none, distal peripheral pulses palpable.  Skin: No rashes, no icterus. MSK: Normal muscle bulk, tone, power.  Medications reviewed:  Scheduled Meds:  feeding supplement  237 mL Oral TID BM   folic acid  1 mg Oral Daily   furosemide  40 mg Oral Daily   insulin aspart  0-5 Units Subcutaneous QHS   insulin aspart  0-9 Units Subcutaneous TID WC   pantoprazole  40 mg Oral BID   rifaximin  550 mg Oral BID   sodium chloride  1 g Oral BID   spironolactone  100 mg Oral Daily   tamsulosin  0.4 mg Oral Daily   thiamine  100 mg Oral Daily   Continuous Infusions:  cefTRIAXone (ROCEPHIN)  IV     Diet Order             Diet regular Room service appropriate? Yes; Fluid consistency: Thin; Fluid restriction: 1200 mL Fluid  Diet effective now                 Weight change:   Wt Readings from Last 3 Encounters:  12/18/21 78 kg  12/11/21 78 kg  11/12/21 77.8 kg  Consultants:see note  Procedures:see note Antimicrobials: Anti-infectives (From admission, onward)    Start     Dose/Rate Route Frequency Ordered Stop   12/19/21 1015  rifaximin (XIFAXAN) tablet 550 mg        550 mg Oral 2 times daily 12/19/21 0927     12/18/21 1200  cefTRIAXone (ROCEPHIN) 1 g in sodium chloride 0.9 % 100 mL IVPB        1 g 200 mL/hr over 30 Minutes Intravenous Every 24 hours 12/18/21 1133        Culture/Microbiology    Component Value Date/Time   SDES BLOOD BLOOD RIGHT HAND 12/18/2021 1547   SPECREQUEST  12/18/2021 1547    BOTTLES DRAWN AEROBIC AND ANAEROBIC Blood Culture adequate volume   CULT  12/18/2021 1547    NO GROWTH < 24 HOURS Performed at [facility], [address] [place], [place] [address]    REPTSTATUS PENDING 12/18/2021 1547    Other culture-see note  Unresulted Labs (From admission, onward)     Start     Ordered    12/20/21 [id number]  Basic metabolic panel  Daily,   R     Question:  Specimen collection method  Answer:  Lab=Lab collect   12/19/21 0926   12/18/21 1434  Miscellaneous [facility] test (send-out)  RELEASE UPON ORDERING,   STAT        12/18/21 1434   12/18/21 1434  Protein, body fluid (other)  Once,   R        12/18/21 1434   12/18/21 0613  Urinalysis, Routine w reflex microscopic  Once,   STAT        12/18/21 [id number]          Data Reviewed: I have personally reviewed following labs and imaging studies CBC: Recent Labs  Lab 12/13/21 0550 12/13/21 1204 12/14/21 0451 12/18/21 0615 12/19/21 0456  WBC 13.9*  --  11.7* 14.6* 10.3  NEUTROABS 11.7*  --  8.2*  --   --   HGB 7.8* 6.8* 8.6* 9.6* 8.4*  HCT 21.8* 19.0* 23.8* 26.3* 24.1*  MCV 92.0  --  89.8 100.8* 94.1  PLT 117*  --  111* 110* [id number]*   Basic Metabolic Panel: Recent Labs  Lab 12/13/21 0550 12/14/21 0451 12/18/21 0615 12/19/21 0456  NA 132* 130* 129* 126*  K 5.2* 5.1 4.2 4.9  CL 109 105 103 103  CO2 17* 20* 20* 18*  GLUCOSE 179* 215* 220* 201*  BUN 64* 47* 31* 37*  CREATININE 1.63* 1.27* 1.52* 1.38*  CALCIUM 8.0* 7.8* 7.9* 8.0*   GFR: Estimated Creatinine Clearance: 59.6 mL/min (A) (by C-G formula based on SCr of 1.38 mg/dL (H)). Liver Function Tests: Recent Labs  Lab 12/18/21 0615  AST 41  ALT 26  ALKPHOS 164*  BILITOT 2.6*  PROT 6.6  ALBUMIN 2.9*   Recent Labs  Lab 12/18/21 0615  LIPASE 79*   Recent Labs  Lab 12/18/21 1546  AMMONIA 25   Coagulation Profile: Recent Labs  Lab 12/18/21 0754  INR 1.8*   Cardiac Enzymes: No results for input(s): CKTOTAL, CKMB, CKMBINDEX, TROPONINI in the last 168 hours. BNP (last 3 results) No results for input(s): PROBNP in the last 8760 hours. HbA1C: No results for input(s): HGBA1C in the last 72 hours. CBG: Recent Labs  Lab 12/14/21 1212 12/18/21 1258 12/18/21 1722 12/18/21 2059 12/19/21 0908  GLUCAP 182* 161* 250* 202* 136*   Lipid Profile: No results  for input(s): CHOL, HDL, LDLCALC, TRIG, CHOLHDL, LDLDIRECT in the last 72 hours. Thyroid Function Tests: No results for input(s): TSH, T4TOTAL, FREET4, T3FREE, THYROIDAB in the last 72 hours. Anemia Panel: No results for input(s): VITAMINB12, FOLATE, FERRITIN, TIBC, IRON, RETICCTPCT in the last 72 hours. Sepsis Labs: No results for input(s): PROCALCITON, LATICACIDVEN in the last 168 hours.  Recent Results (from the past 240 hour(s))  Resp Panel by RT-PCR (Flu A&B, Covid) Nasopharyngeal Swab     Status: None   Collection Time: 12/11/21 12:57 PM   Specimen: Nasopharyngeal Swab; Nasopharyngeal(NP) swabs in vial transport medium  Result Value Ref Range Status   SARS Coronavirus 2 by RT PCR NEGATIVE NEGATIVE Final    Comment: (NOTE) SARS-CoV-2 target nucleic acids are NOT DETECTED.  The SARS-CoV-2 RNA is generally detectable in upper respiratory specimens during the acute phase of infection. The lowest concentration of SARS-CoV-2 viral copies this assay can detect is 138 copies/mL. A negative result does not preclude SARS-Cov-2 infection and should not be used as the sole basis for treatment or other patient management decisions. A negative result may occur with  improper specimen collection/handling, submission of specimen other than nasopharyngeal swab, presence of viral mutation(s) within the areas targeted by this assay, and inadequate number of viral copies(<138 copies/mL). A negative result must be combined with clinical observations, patient history, and epidemiological information. The expected result is Negative.  Fact Sheet for Patients:  EntrepreneurPulse.com.au  Fact Sheet for Healthcare Providers:  IncredibleEmployment.be  This test is no t yet approved or cleared by the [place] FDA and  has been authorized for detection and/or diagnosis of SARS-CoV-2 by FDA under an Emergency Use Authorization (EUA). This EUA will remain  in  effect (meaning this test can be used) for the duration of the COVID-19 declaration under Section 564(b)(1) of the Act, 21 U.S.C.section 360bbb-3(b)(1), unless the authorization is terminated  or revoked sooner.       Influenza A by PCR NEGATIVE  NEGATIVE Final   Influenza B by PCR NEGATIVE NEGATIVE Final    Comment: (NOTE) The Xpert Xpress SARS-CoV-2/FLU/RSV plus assay is intended as an aid in the diagnosis of influenza from Nasopharyngeal swab specimens and should not be used as a sole basis for treatment. Nasal washings and aspirates are unacceptable for Xpert Xpress SARS-CoV-2/FLU/RSV testing.  Fact Sheet for Patients: EntrepreneurPulse.com.au  Fact Sheet for Healthcare Providers: IncredibleEmployment.be  This test is not yet approved or cleared by the [place] FDA and has been authorized for detection and/or diagnosis of SARS-CoV-2 by FDA under an Emergency Use Authorization (EUA). This EUA will remain in effect (meaning this test can be used) for the duration of the COVID-19 declaration under Section 564(b)(1) of the Act, 21 U.S.C. section 360bbb-3(b)(1), unless the authorization is terminated or revoked.  Performed at [facility] [address] [place], [place] [address]   MRSA Next Gen by PCR, Nasal     Status: None   Collection Time: 12/11/21  5:25 PM   Specimen: Nasal Mucosa; Nasal Swab  Result Value Ref Range Status   MRSA by PCR Next Gen NOT DETECTED NOT DETECTED Final    Comment: (NOTE) The GeneXpert MRSA Assay (FDA approved for NASAL specimens only), is one component of a comprehensive MRSA colonization surveillance program. It is not intended to diagnose MRSA infection nor to guide or monitor treatment for MRSA infections. Test performance is not FDA approved in patients less than 13 years old. Performed at [facility], [address] [place], [place] [address]   Resp Panel by RT-PCR (Flu  A&B, Covid) Nasopharyngeal Swab     Status: None   Collection Time: 12/18/21  7:54 AM   Specimen: Nasopharyngeal Swab; Nasopharyngeal(NP) swabs in vial transport medium  Result Value Ref Range Status   SARS Coronavirus 2 by RT PCR NEGATIVE NEGATIVE Final    Comment: (NOTE) SARS-CoV-2 target nucleic acids are NOT DETECTED.  The SARS-CoV-2 RNA is generally detectable in upper respiratory specimens during the acute phase of infection. The lowest concentration of SARS-CoV-2 viral copies this assay can detect is 138 copies/mL. A negative result does not preclude SARS-Cov-2 infection and should not be used as the sole basis for treatment or other patient management decisions. A negative result may occur with  improper specimen collection/handling, submission of specimen other than nasopharyngeal swab, presence of viral mutation(s) within the areas targeted by this assay, and inadequate number of viral copies(<138 copies/mL). A negative result must be combined with clinical observations, patient history, and epidemiological information. The expected result is Negative.  Fact Sheet for Patients:  EntrepreneurPulse.com.au  Fact Sheet for Healthcare Providers:  IncredibleEmployment.be  This test is no t yet approved or cleared by the [place] FDA and  has been authorized for detection and/or diagnosis of SARS-CoV-2 by FDA under an Emergency Use Authorization (EUA). This EUA will remain  in effect (meaning this test can be used) for the duration of the COVID-19 declaration under Section 564(b)(1) of the Act, 21 U.S.C.section 360bbb-3(b)(1), unless the authorization is terminated  or revoked sooner.       Influenza A by PCR NEGATIVE NEGATIVE Final   Influenza B by PCR NEGATIVE NEGATIVE Final    Comment: (NOTE) The Xpert Xpress SARS-CoV-2/FLU/RSV plus assay is intended as an aid in the diagnosis of influenza from Nasopharyngeal swab specimens  and should not be used as a sole basis for treatment. Nasal washings and aspirates are unacceptable for Xpert Xpress SARS-CoV-2/FLU/RSV testing.  Fact  Sheet for Patients: EntrepreneurPulse.com.au  Fact Sheet for Healthcare Providers: IncredibleEmployment.be  This test is not yet approved or cleared by the [place] FDA and has been authorized for detection and/or diagnosis of SARS-CoV-2 by FDA under an Emergency Use Authorization (EUA). This EUA will remain in effect (meaning this test can be used) for the duration of the COVID-19 declaration under Section 564(b)(1) of the Act, 21 U.S.C. section 360bbb-3(b)(1), unless the authorization is terminated or revoked.  Performed at [facility], [address] [place], [place] [address]   Body fluid culture w Gram Stain     Status: None (Preliminary result)   Collection Time: 12/18/21  2:34 PM   Specimen: PATH Cytology Peritoneal fluid  Result Value Ref Range Status   Specimen Description   Final    PERITONEAL Performed at [facility], [address] [place], [place] [address]    Special Requests   Final    NONE Performed at [facility], [address] [place], [place] [address]    Gram Stain   Final    CYTOSPIN SMEAR FEW WBC SEEN NO ORGANISMS SEEN Performed at [facility], [place] [address], [place], [place] [address]    Culture PENDING  Incomplete   Report Status PENDING  Incomplete  CULTURE, BLOOD (ROUTINE X 2) w Reflex to ID Panel     Status: None (Preliminary result)   Collection Time: 12/18/21  3:46 PM   Specimen: BLOOD  Result Value Ref Range Status   Specimen Description BLOOD RIGHT ANTECUBITAL  Final   Special Requests   Final    BOTTLES DRAWN AEROBIC AND ANAEROBIC Blood Culture adequate volume   Culture   Final    NO GROWTH < 24 HOURS Performed at [facility], [address], [place], [place] [address]    Report Status  PENDING  Incomplete  CULTURE, BLOOD (ROUTINE X 2) w Reflex to ID Panel     Status: None (Preliminary result)   Collection Time: 12/18/21  3:47 PM   Specimen: BLOOD  Result Value Ref Range Status   Specimen Description BLOOD BLOOD RIGHT HAND  Final   Special Requests   Final    BOTTLES DRAWN AEROBIC AND ANAEROBIC Blood Culture adequate volume   Culture   Final    NO GROWTH < 24 HOURS Performed at [facility] [address], [place], [place] [address]    Report Status PENDING  Incomplete     Radiology Studies: CT ABDOMEN PELVIS W CONTRAST  Result Date: 12/18/2021 CLINICAL DATA:  Acute, nonlocalized abdominal pain. Vomiting with abdominal pain and swelling EXAM: CT ABDOMEN AND PELVIS WITH CONTRAST TECHNIQUE: Multidetector CT imaging of the abdomen and pelvis was performed using the standard protocol following bolus administration of intravenous contrast. RADIATION DOSE REDUCTION: This exam was performed according to the departmental dose-optimization program which includes automated exposure control, adjustment of the mA and/or kV according to patient size and/or use of iterative reconstruction technique. CONTRAST:  129mL OMNIPAQUE IOHEXOL 300 MG/ML  SOLN COMPARISON:  04/28/2021 FINDINGS: Lower chest: Lower periesophageal varices. Premature atheromatous calcification of the coronaries Hepatobiliary: Known cirrhosis with large caudate lobe and surface lobulation. Heterogeneous perfusion/fat deposition. No focal mass is seen.Large volume ascites, simple and diffusely distributed, less tense than before. 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 Cholelithiasis. Full gallbladder but no detected inflammatory changes are superimposed. Pancreas: Question pancreas divisum. On axial slices, low-density at the upper pancreatic body is volume averaged fat based on  reformats. Spleen: Unremarkable Adrenals/Urinary Tract: Negative adrenals. No hydronephrosis or stone. Unremarkable bladder.  Stomach/Bowel:  No obstruction. No appendicitis. Vascular/Lymphatic: Nonocclusive thrombus in the lower main portal vein. No mass or adenopathy. Reproductive:No pathologic findings. Other: No ascites or pneumoperitoneum. Musculoskeletal: No acute abnormalities. IMPRESSION: 1. Cirrhosis with large volume ascites and nonocclusive main portal vein thrombus. 2. Cholelithiasis. Full gallbladder but no focal inflammatory changes. 3.  Periesophageal varices. 4. Premature atherosclerosis including the coronary arteries. Electronically Signed   By: Jorje Guild M.D.   On: 12/18/2021 10:04   US Paracentesis  Result Date: 12/18/2021 INDICATION: Recurrent ascites EXAM: ULTRASOUND GUIDED RLQ PARACENTESIS MEDICATIONS: 10 cc 1% lidocaine. COMPLICATIONS: None immediate. PROCEDURE: Informed written consent was obtained from the patient after a discussion of the risks, benefits and alternatives to treatment. A timeout was performed prior to the initiation of the procedure. Initial ultrasound scanning demonstrates a large amount of ascites within the right lower abdominal quadrant. The right lower abdomen was prepped and draped in the usual sterile fashion. 1% lidocaine was used for local anesthesia. Following this, a Yueh catheter was introduced. An ultrasound image was saved for documentation purposes. The paracentesis was performed. The catheter was removed and a dressing was applied. The patient tolerated the procedure well without immediate post procedural complication. FINDINGS: A total of approximately 3.9 liters of yellow fluid was removed. Samples were sent to the laboratory as requested by the clinical team. IMPRESSION: Successful ultrasound-guided paracentesis yielding 3.9 liters of peritoneal fluid. Read by Lavonia Drafts [facility] Electronically Signed   By: Michaelle Birks M.D.   On: 12/18/2021 19:18   US ABDOMEN LIMITED RUQ (LIVER/GB)  Result Date: 12/18/2021 CLINICAL DATA:  Abdominal pain. EXAM: ULTRASOUND ABDOMEN  LIMITED RIGHT UPPER QUADRANT COMPARISON:  None. FINDINGS: Gallbladder: No gallstones or wall thickening visualized. No sonographic Murphy sign noted by sonographer. Common bile duct: Diameter: 4 mm Liver: Decreased liver size. Coarsened echotexture and overall increased echogenicity. Surface nodularity. No defined mass. Portal vein is patent on color Doppler imaging with normal direction of blood flow towards the liver. Other: Moderate ascites. IMPRESSION: 1. No acute findings.  Normal gallbladder.  No bile duct dilation. 2. Findings consistent with cirrhosis and portal venous hypertension, the latter reflected by moderate ascites. No liver mass. Electronically Signed   By: Lajean Manes M.D.   On: 12/18/2021 09:59     LOS: 1 day   Antonieta Pert, MD [facility]  12/19/2021, 9:27 AM

## 2021-12-20 LAB — GLUCOSE, CAPILLARY
Glucose-Capillary: 140 mg/dL — ABNORMAL HIGH (ref 70–99)
Glucose-Capillary: 190 mg/dL — ABNORMAL HIGH (ref 70–99)
Glucose-Capillary: 201 mg/dL — ABNORMAL HIGH (ref 70–99)
Glucose-Capillary: 175 mg/dL — ABNORMAL HIGH (ref 70–99)
Glucose-Capillary: 218 mg/dL — ABNORMAL HIGH (ref 70–99)

## 2021-12-20 LAB — BASIC METABOLIC PANEL
Anion gap: 6 (ref 5–15)
BUN: 43 mg/dL — ABNORMAL HIGH (ref 6–20)
CO2: 17 mmol/L — ABNORMAL LOW (ref 22–32)
Calcium: 7.6 mg/dL — ABNORMAL LOW (ref 8.9–10.3)
Chloride: 104 mmol/L (ref 98–111)
Creatinine, Ser: 1.83 mg/dL — ABNORMAL HIGH (ref 0.61–1.24)
GFR, Estimated: 44 mL/min — ABNORMAL LOW (ref >60)
Glucose, Bld: 154 mg/dL — ABNORMAL HIGH (ref 70–99)
Potassium: 4.9 mmol/L (ref 3.5–5.1)
Sodium: 127 mmol/L — ABNORMAL LOW (ref 135–145)

## 2021-12-20 LAB — PROTEIN, BODY FLUID (OTHER): Total Protein, Body Fluid Other: 0.9 g/dL

## 2021-12-20 MED ORDER — ALBUMIN HUMAN 25 % IV SOLN
12.5000 g | Freq: Four times a day (QID) | INTRAVENOUS | Status: AC
  Administered 2021-12-20 – 2021-12-21 (×4): 12.5 g via INTRAVENOUS
  Filled 2021-12-20 (×4): qty 50

## 2021-12-20 NOTE — Progress Notes (Signed)
PROGRESS NOTE    Shawn Torres  VZD:[id number] DOB: 1966-04-29 DOA: 12/18/2021 PCP: Kurtis Bushman, MD   Chief Complaint  Patient presents with   Hematemesis  Brief Narrative/Hospital Course: Shawn Torres, 56 y.o. 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 CT I nED-  cirrhosis, non occlusive main portal vein thrombosis, cholelithiasis, large volume ascites. Labs WBC 14.6, ? SBP, started Rocephin empirically. S/P paracentesis 3.9 liters.   Subjective:  Feels well no abd pain wants to go home No new complaints Creat worsening  Assessment & Plan:  Abdominal pain 2/2 cirrhosis Decompensated alcoholic liver cirrhosis with large volume ascites portal hypertension, esophageal varices coagulopathy ( inr 1.8): S/P paracentesis 3.9l.  Total nucleated cell 149 with neutrophils 18%, gram stain no organism, no e/o SBP.  Patient was placed on empiric ceftriaxone admission-for possible SBP but no e/o SBP- stopped antibiotics  hold lasix and aldactone 2/2 aki, we will do iv albuminMonitor PT/INR ammonia and hemoglobin  Leukocytosis:resolved.  Unclear etiology could be reactive.  Hyponatremia acute on chronic , was at 130 on last discahrge. At 127,  cont salt tab Recent Labs  Lab 12/14/21 0451 12/18/21 0615 12/19/21 0456 12/20/21 0645  NA 130* 129* 126* 127*    Chronic thrombocytopenia in the setting of liver cirrhosis.  Monitor Recent Labs  Lab 12/14/21 0451 12/18/21 0615 12/19/21 0456  PLT 111* 110* 102*      T2DM: Diet controlled last A1c 5.5, blood sugar borderline, continue SSI Recent Labs  Lab 12/19/21 0908 12/19/21 1201 12/19/21 1816 12/19/21 2050 12/20/21 0817  GLUCAP 136* 200* 294* 143*  140*     AKI on CKD IIIa: Baseline creatinine 1.2-1.5. hold lasix aldactone - wil fo iv albumin x 24 hrs Recent Labs  Lab 12/14/21 0451 12/18/21 0615 12/19/21 0456 12/20/21 0645  BUN 47* 31* 37* 43*  CREATININE 1.27* 1.52* 1.38* 1.83*     Macrocytic anemia Anemia of chronic disease with recent GI bleeding: Hemoglobin is fairly stable.  Monitor Recent Labs  Lab 12/13/21 1204 12/14/21 0451 12/18/21 0615 12/19/21 0456  HGB 6.8* 8.6* 9.6* 8.4*  HCT 19.0* 23.8* 26.3* 24.1*     GERD: On PPI  BPH: Continue Flomax  Portal vein thrombosis: Not a candidate for anticoagulation given patient's  varices, gibleeding and anemia  Goals of care /debility: PT OT eval, overall prognosis guarded  DVT prophylaxis: SCDs Start: 12/18/21 1132 Code Status:   Code Status: Full Code Family Communication: plan of care discussed with patient at bedside. [place]ie[place] interpretor and with daughter on phone Status is: Inpatient Remains inpatient appropriate because: for ongoing management of ascites Disposition: Currently not  medically stable for discharge. Anticipated Disposition: Home  with daughter if creat stable tomorrow   Total time spent in the care of this patient 96 MINUTES Objective: Vitals last 24 hrs: Vitals:   12/19/21 1626 12/19/21 2025 12/20/21 0613 12/20/21 0754  BP: 104/66 102/65 109/68 112/69  Pulse: 73 76 74 69  Resp:  16 16 18   Temp: 98.4 F (36.9 C) 98.2 F (36.8 C) 98.7 F (37.1 C) 98.7 F (37.1 C)  TempSrc: Oral Oral Oral   SpO2: 99% 100% 100% 100%  Weight:      Height:       Weight change:   Intake/Output Summary (  Last 24 hours) at 12/20/2021 1019 Last data filed at 12/19/2021 1300 Gross per 24 hour  Intake 240 ml  Output --  Net 240 ml   Net IO Since Admission: 1,179.98 mL [12/20/21 1019]   Physical Examination: General exam: AAOx 3,older than stated age, weak appearing. HEENT:Oral mucosa moist, Ear/Nose WNL grossly, dentition normal. Respiratory system:  bilaterally diminished, no use of accessory muscle Cardiovascular system: S1 & S2 +, No JVD,. Gastrointestinal system: Abdomen soft, mildly distended nontender,BS+ Nervous System:Alert, awake, moving extremities and grossly nonfocal Extremities: No edema, distal peripheral pulses palpable.  Skin: No rashes,no icterus. MSK: Normal muscle bulk,tone, power   Medications reviewed:  Scheduled Meds:  feeding supplement  237 mL Oral TID BM   folic acid  1 mg Oral Daily   insulin aspart  0-5 Units Subcutaneous QHS   insulin aspart  0-9 Units Subcutaneous TID WC   pantoprazole  40 mg Oral BID   rifaximin  550 mg Oral BID   sodium chloride  1 g Oral BID   tamsulosin  0.4 mg Oral Daily   thiamine  100 mg Oral Daily   Continuous Infusions:  albumin human 12.5 g (12/20/21 1005)   Diet Order             Diet regular Room service appropriate? Yes; Fluid consistency: Thin; Fluid restriction: 1200 mL Fluid  Diet effective now                 Weight change:   Wt Readings from Last 3 Encounters:  12/18/21 78 kg  12/11/21 78 kg  11/12/21 77.8 kg  Consultants:see note  Procedures:see note Antimicrobials: Anti-infectives (From admission, onward)    Start     Dose/Rate Route Frequency Ordered Stop   12/19/21 1015  rifaximin (XIFAXAN) tablet 550 mg        550 mg Oral 2 times daily 12/19/21 0927     12/18/21 1200  cefTRIAXone (ROCEPHIN) 1 g in sodium chloride 0.9 % 100 mL IVPB  Status:  Discontinued        1 g 200 mL/hr over 30 Minutes Intravenous Every 24 hours 12/18/21 1133 12/19/21 0956      Culture/Microbiology    Component Value Date/Time   SDES BLOOD BLOOD RIGHT HAND 12/18/2021 1547   SPECREQUEST  12/18/2021 1547    BOTTLES DRAWN AEROBIC AND ANAEROBIC Blood Culture adequate volume   CULT  12/18/2021 1547    NO GROWTH 2 DAYS Performed at [facility], [address] [place], [place] [address]    REPTSTATUS PENDING 12/18/2021 1547    Other culture-see  note  Unresulted Labs (From admission, onward)     Start     Ordered   12/20/21 [id number]  Basic metabolic panel  Daily,   R     Question:  Specimen collection method  Answer:  Lab=Lab collect   12/19/21 0926   12/18/21 1434  Miscellaneous [facility] test (send-out)  RELEASE UPON ORDERING,   STAT        12/18/21 1434   12/18/21 1434  Protein, body fluid (other)  Once,   R        12/18/21 1434   12/18/21 0613  Urinalysis, Routine w reflex microscopic  Once,   STAT        12/18/21 [id number]          Data Reviewed: I have personally reviewed following labs and imaging studies CBC: Recent Labs  Lab 12/13/21 1204 12/14/21 0451 12/18/21 0615 12/19/21 0456  WBC  --  11.7* 14.6* 10.3  NEUTROABS  --  8.2*  --   --   HGB 6.8* 8.6* 9.6* 8.4*  HCT 19.0* 23.8* 26.3* 24.1*  MCV  --  89.8 100.8* 94.1  PLT  --  111* 110* [id number]*   Basic Metabolic Panel: Recent Labs  Lab 12/14/21 0451 12/18/21 0615 12/19/21 0456 12/20/21 0645  NA 130* 129* 126* 127*  K 5.1 4.2 4.9 4.9  CL 105 103 103 104  CO2 20* 20* 18* 17*  GLUCOSE 215* 220* 201* 154*  BUN 47* 31* 37* 43*  CREATININE 1.27* 1.52* 1.38* 1.83*  CALCIUM 7.8* 7.9* 8.0* 7.6*   GFR: Estimated Creatinine Clearance: 45 mL/min (A) (by C-G formula based on SCr of 1.83 mg/dL (H)). Liver Function Tests: Recent Labs  Lab 12/18/21 0615  AST 41  ALT 26  ALKPHOS 164*  BILITOT 2.6*  PROT 6.6  ALBUMIN 2.9*   Recent Labs  Lab 12/18/21 0615  LIPASE 79*   Recent Labs  Lab 12/18/21 1546  AMMONIA 25   Coagulation Profile: Recent Labs  Lab 12/18/21 0754  INR 1.8*   Cardiac Enzymes: No results for input(s): CKTOTAL, CKMB, CKMBINDEX, TROPONINI in the last 168 hours. BNP (last 3 results) No results for input(s): PROBNP in the last 8760 hours. HbA1C: No results for input(s): HGBA1C in the last 72 hours. CBG: Recent Labs  Lab 12/19/21 0908 12/19/21 1201 12/19/21 1816 12/19/21 2050 12/20/21 0817  GLUCAP 136* 200* 294* 143* 140*    Lipid Profile: No results for input(s): CHOL, HDL, LDLCALC, TRIG, CHOLHDL, LDLDIRECT in the last 72 hours. Thyroid Function Tests: No results for input(s): TSH, T4TOTAL, FREET4, T3FREE, THYROIDAB in the last 72 hours. Anemia Panel: No results for input(s): VITAMINB12, FOLATE, FERRITIN, TIBC, IRON, RETICCTPCT in the last 72 hours. Sepsis Labs: No results for input(s): PROCALCITON, LATICACIDVEN in the last 168 hours.  Recent Results (from the past 240 hour(s))  Resp Panel by RT-PCR (Flu A&B, Covid) Nasopharyngeal Swab     Status: None   Collection Time: 12/11/21 12:57 PM   Specimen: Nasopharyngeal Swab; Nasopharyngeal(NP) swabs in vial transport medium  Result Value Ref Range Status   SARS Coronavirus 2 by RT PCR NEGATIVE NEGATIVE Final    Comment: (NOTE) SARS-CoV-2 target nucleic acids are NOT DETECTED.  The SARS-CoV-2 RNA is generally detectable in upper respiratory specimens during the acute phase of infection. The lowest concentration of SARS-CoV-2 viral copies this assay can detect is 138 copies/mL. A negative result does not preclude SARS-Cov-2 infection and should not be used as the sole basis for treatment or other patient management decisions. A negative result may occur with  improper specimen collection/handling, submission of specimen other than nasopharyngeal swab, presence of viral mutation(s) within the areas targeted by this assay, and inadequate number of viral copies(<138 copies/mL). A negative result must be combined with clinical observations, patient history, and epidemiological information. The expected result is Negative.  Fact Sheet for Patients:  EntrepreneurPulse.com.au  Fact Sheet for Healthcare Providers:  IncredibleEmployment.be  This test is no t yet approved or cleared by the [place] FDA and  has been authorized for detection and/or diagnosis of SARS-CoV-2 by FDA under an Emergency Use Authorization  (EUA). This EUA will remain  in effect (meaning this test can be used) for the duration of the COVID-19 declaration under Section 564(b)(1) of the Act, 21 U.S.C.section 360bbb-3(b)(1), unless the authorization is terminated  or revoked sooner.       Influenza A  by PCR NEGATIVE NEGATIVE Final   Influenza B by PCR NEGATIVE NEGATIVE Final    Comment: (NOTE) The Xpert Xpress SARS-CoV-2/FLU/RSV plus assay is intended as an aid in the diagnosis of influenza from Nasopharyngeal swab specimens and should not be used as a sole basis for treatment. Nasal washings and aspirates are unacceptable for Xpert Xpress SARS-CoV-2/FLU/RSV testing.  Fact Sheet for Patients: EntrepreneurPulse.com.au  Fact Sheet for Healthcare Providers: IncredibleEmployment.be  This test is not yet approved or cleared by the [place] FDA and has been authorized for detection and/or diagnosis of SARS-CoV-2 by FDA under an Emergency Use Authorization (EUA). This EUA will remain in effect (meaning this test can be used) for the duration of the COVID-19 declaration under Section 564(b)(1) of the Act, 21 U.S.C. section 360bbb-3(b)(1), unless the authorization is terminated or revoked.  Performed at [facility], [address] [place], [place] [address]   MRSA Next Gen by PCR, Nasal     Status: None   Collection Time: 12/11/21  5:25 PM   Specimen: Nasal Mucosa; Nasal Swab  Result Value Ref Range Status   MRSA by PCR Next Gen NOT DETECTED NOT DETECTED Final    Comment: (NOTE) The GeneXpert MRSA Assay (FDA approved for NASAL specimens only), is one component of a comprehensive MRSA colonization surveillance program. It is not intended to diagnose MRSA infection nor to guide or monitor treatment for MRSA infections. Test performance is not FDA approved in patients less than 78 years old. Performed at [facility], [address] [place], [place]  [address]   Resp Panel by RT-PCR (Flu A&B, Covid) Nasopharyngeal Swab     Status: None   Collection Time: 12/18/21  7:54 AM   Specimen: Nasopharyngeal Swab; Nasopharyngeal(NP) swabs in vial transport medium  Result Value Ref Range Status   SARS Coronavirus 2 by RT PCR NEGATIVE NEGATIVE Final    Comment: (NOTE) SARS-CoV-2 target nucleic acids are NOT DETECTED.  The SARS-CoV-2 RNA is generally detectable in upper respiratory specimens during the acute phase of infection. The lowest concentration of SARS-CoV-2 viral copies this assay can detect is 138 copies/mL. A negative result does not preclude SARS-Cov-2 infection and should not be used as the sole basis for treatment or other patient management decisions. A negative result may occur with  improper specimen collection/handling, submission of specimen other than nasopharyngeal swab, presence of viral mutation(s) within the areas targeted by this assay, and inadequate number of viral copies(<138 copies/mL). A negative result must be combined with clinical observations, patient history, and epidemiological information. The expected result is Negative.  Fact Sheet for Patients:  EntrepreneurPulse.com.au  Fact Sheet for Healthcare Providers:  IncredibleEmployment.be  This test is no t yet approved or cleared by the [place] FDA and  has been authorized for detection and/or diagnosis of SARS-CoV-2 by FDA under an Emergency Use Authorization (EUA). This EUA will remain  in effect (meaning this test can be used) for the duration of the COVID-19 declaration under Section 564(b)(1) of the Act, 21 U.S.C.section 360bbb-3(b)(1), unless the authorization is terminated  or revoked sooner.       Influenza A by PCR NEGATIVE NEGATIVE Final   Influenza B by PCR NEGATIVE NEGATIVE Final    Comment: (NOTE) The Xpert Xpress SARS-CoV-2/FLU/RSV plus assay is intended as an aid in the diagnosis of influenza from  Nasopharyngeal swab specimens and should not be used as a sole basis for treatment. Nasal washings and aspirates are unacceptable for Xpert Xpress SARS-CoV-2/FLU/RSV  testing.  Fact Sheet for Patients: EntrepreneurPulse.com.au  Fact Sheet for Healthcare Providers: IncredibleEmployment.be  This test is not yet approved or cleared by the [place] FDA and has been authorized for detection and/or diagnosis of SARS-CoV-2 by FDA under an Emergency Use Authorization (EUA). This EUA will remain in effect (meaning this test can be used) for the duration of the COVID-19 declaration under Section 564(b)(1) of the Act, 21 U.S.C. section 360bbb-3(b)(1), unless the authorization is terminated or revoked.  Performed at [facility], [address] [place], [place] [address]   Body fluid culture w Gram Stain     Status: None (Preliminary result)   Collection Time: 12/18/21  2:34 PM   Specimen: PATH Cytology Peritoneal fluid  Result Value Ref Range Status   Specimen Description   Final    PERITONEAL Performed at [facility], [address] [place], [place] [address]    Special Requests   Final    NONE Performed at [facility], [address] [place], [place] [address]    Gram Stain CYTOSPIN SMEAR FEW WBC SEEN NO ORGANISMS SEEN   Final   Culture   Final    NO GROWTH 2 DAYS Performed at [facility], [place] [address] [place], [place] [address]    Report Status PENDING  Incomplete  CULTURE, BLOOD (ROUTINE X 2) w Reflex to ID Panel     Status: None (Preliminary result)   Collection Time: 12/18/21  3:46 PM   Specimen: BLOOD  Result Value Ref Range Status   Specimen Description BLOOD RIGHT ANTECUBITAL  Final   Special Requests   Final    BOTTLES DRAWN AEROBIC AND ANAEROBIC Blood Culture adequate volume   Culture   Final    NO GROWTH 2 DAYS Performed at [facility], [address] [place],  [place] [address]    Report Status PENDING  Incomplete  CULTURE, BLOOD (ROUTINE X 2) w Reflex to ID Panel     Status: None (Preliminary result)   Collection Time: 12/18/21  3:47 PM   Specimen: BLOOD  Result Value Ref Range Status   Specimen Description BLOOD BLOOD RIGHT HAND  Final   Special Requests   Final    BOTTLES DRAWN AEROBIC AND ANAEROBIC Blood Culture adequate volume   Culture   Final    NO GROWTH 2 DAYS Performed at [facility], [address] [place], [place] [address]    Report Status PENDING  Incomplete     Radiology Studies: US Paracentesis  Result Date: 12/18/2021 INDICATION: Recurrent ascites EXAM: ULTRASOUND GUIDED RLQ PARACENTESIS MEDICATIONS: 10 cc 1% lidocaine. COMPLICATIONS: None immediate. PROCEDURE: Informed written consent was obtained from the patient after a discussion of the risks, benefits and alternatives to treatment. A timeout was performed prior to the initiation of the procedure. Initial ultrasound scanning demonstrates a large amount of ascites within the right lower abdominal quadrant. The right lower abdomen was prepped and draped in the usual sterile fashion. 1% lidocaine was used for local anesthesia. Following this, a Yueh catheter was introduced. An ultrasound image was saved for documentation purposes. The paracentesis was performed. The catheter was removed and a dressing was applied. The patient tolerated the procedure well without immediate post procedural complication. FINDINGS: A total of approximately 3.9 liters of yellow fluid was removed. Samples were sent to the laboratory as requested by the clinical team. IMPRESSION: Successful ultrasound-guided paracentesis yielding 3.9 liters of peritoneal fluid. Read by Lavonia Drafts PAC  Electronically Signed   By: Michaelle Birks M.D.   On: 12/18/2021 19:18     LOS: 2 days   Antonieta Pert, MD [facility]  12/20/2021, 10:19 AM

## 2021-12-21 LAB — BASIC METABOLIC PANEL
Anion gap: 6 (ref 5–15)
BUN: 40 mg/dL — ABNORMAL HIGH (ref 6–20)
CO2: 18 mmol/L — ABNORMAL LOW (ref 22–32)
Calcium: 7.7 mg/dL — ABNORMAL LOW (ref 8.9–10.3)
Chloride: 105 mmol/L (ref 98–111)
Creatinine, Ser: 1.38 mg/dL — ABNORMAL HIGH (ref 0.61–1.24)
GFR, Estimated: >60 mL/min (ref >60)
Glucose, Bld: 148 mg/dL — ABNORMAL HIGH (ref 70–99)
Potassium: 4.7 mmol/L (ref 3.5–5.1)
Sodium: 129 mmol/L — ABNORMAL LOW (ref 135–145)

## 2021-12-21 LAB — GLUCOSE, CAPILLARY: Glucose-Capillary: 128 mg/dL — ABNORMAL HIGH (ref 70–99)

## 2021-12-21 LAB — CYTOLOGY - NON PAP

## 2021-12-21 NOTE — Discharge Summary (Signed)
Physician Discharge Summary  Shawn Torres [id number] DOB: 1966/10/28 DOA: 12/18/2021  PCP: Caryl Never, MD  Admit date: 12/18/2021 Discharge date: 12/21/2021  Admitted From: home Disposition:  home  Recommendations for Outpatient Follow-up:  Follow up with PCP in 1-2 weeks Please obtain BMP/CBC in one week  Home Health:no  Equipment/Devices: none  Discharge Condition: Stable Code Status:   Code Status: Full Code Diet recommendation:  Diet Order             Diet regular Room service appropriate? Yes; Fluid consistency: Thin; Fluid restriction: 1200 mL Fluid  Diet effective now                    Brief/Interim Summary: Shawn Torres, 56 y.o. 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 CT I nED-  cirrhosis, non occlusive main portal vein thrombosis, cholelithiasis, large volume ascites. Labs WBC 14.6, ? SBP, started Rocephin empirically. S/P paracentesis 3.9 liters. Acetic fluid work-up negative for SBP antibiotic discontinued.  Patient remains hospitalized for additional day due to worsening renal failure Lasix and Aldactone held, given IV albumin 24 hours with improvement in renal function. Follow-up with PCP GI as outpatient continue his diuretic regimen will need CBC BMP in 1 week  Discharge Diagnoses:  Decompensated alcoholic liver cirrhosis with large volume ascites portal hypertension, esophageal varices coagulopathy ( inr 1.8): S/P paracentesis 3.9l.  Total nucleated cell 149 with neutrophils 18%, gram stain no organism, no e/o SBP.  Patient was placed on empiric ceftriaxone admission-for possible SBP but no e/o SBP- stopped antibiotics . He will resume his home lasix and aldactone  and will fu with his pcp and gi at [place] [facility]  AKI on CKD stage IIIa creatinine peaked to 1.8 Lasix Aldactone held, S/P IV element x24 hours with improvement in renal function.  Follow-up with PCP outpatient  Recent Labs  Lab 12/18/21 0615 12/19/21 0456 12/20/21 0645 12/21/21 0607  BUN 31* 37* 43* 40*  CREATININE 1.52* 1.38* 1.83* 1.38*    leukocytosis:resolved.  Unclear etiology could be reactive. Hyponatremia acute on chronic , was at 130 on last discahrge.  Improved to 129 on solid tablets.  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  Outpatient follow-up Type 2 diabetes with hyperglycemia diet controlled Macrocytic anemia Anemia of chronic disease with recent GI bleeding: Hemoglobin is fairly stable.  Monitor Recent Labs  Lab 12/18/21 0615 12/19/21 0456  HGB 9.6* 8.4*  HCT 26.3* 24.1*   GERD: On PPI   BPH: Continue Flomax   Portal vein thrombosis: Not a candidate for anticoagulation given patient's  varices, gibleeding and anemia   Goals of care /debility: PT OT eval, overall prognosis guarded   Consults: none  Subjective: Overnight no fever, blood pressure fairly stable. Having BM no abdomen pain.  Discharge Exam: Vitals:   12/21/21 0450 12/21/21 0809  BP: (!) 101/54 (!) 102/59  Pulse: 71 68  Resp: 18 18  Temp: 98.2 F (36.8 C) 99.5 F (37.5 C)  SpO2: 100% 100%   General: Pt is alert, awake, not in acute distress Cardiovascular: RRR, S1/S2 +, no rubs, no gallops Respiratory: CTA bilaterally, no wheezing, no rhonchi Abdominal: Soft, NT, ND, bowel sounds + Extremities: no edema, no cyanosis  Discharge Instructions  Discharge  Instructions     (HEART FAILURE PATIENTS) Call MD:  Anytime you have any of the following symptoms: 1) 3 pound weight gain in 24 hours or 5 pounds in 1 week 2) shortness of breath, with or without a dry hacking cough 3) swelling in the hands, feet or stomach 4) if you have to sleep on extra  pillows at night in order to breathe.   Complete by: As directed    Discharge instructions   Complete by: As directed    Check CBC and BMP in 1 week from your primary care doctor  Please call call MD or return to ER for similar or worsening recurring problem that brought you to hospital or if any fever,nausea/vomiting,abdominal pain, uncontrolled pain, chest pain,  shortness of breath or any other alarming symptoms.  Please follow-up your doctor as instructed in a week time and call the office for appointment.  Please avoid alcohol, smoking, or any other illicit substance and maintain healthy habits including taking your regular medications as prescribed.  You were cared for by a hospitalist during your hospital stay. If you have any questions about your discharge medications or the care you received while you were in the hospital after you are discharged, you can call the unit and ask to speak with the hospitalist on call if the hospitalist that took care of you is not available.  Once you are discharged, your primary care physician will handle any further medical issues. Please note that NO REFILLS for any discharge medications will be authorized once you are discharged, as it is imperative that you return to your primary care physician (or establish a relationship with a primary care physician if you do not have one) for your aftercare needs so that they can reassess your need for medications and monitor your lab values   Increase activity slowly   Complete by: As directed       Allergies as of 12/21/2021   No Known Allergies      Medication List     STOP taking these medications    carvedilol 6.25 MG tablet Commonly known as: COREG   ciprofloxacin 500 MG tablet Commonly known as: Cipro       TAKE these medications    (feeding supplement) PROSource Plus liquid Take 30 mLs by mouth 2 (two) times daily between meals.   feeding supplement Liqd Take 237 mLs by mouth 3  (three) times daily between meals.   folic acid 1 MG tablet Commonly known as: FOLVITE Take 1 tablet by mouth daily.   furosemide 40 MG tablet Commonly known as: LASIX Take 1 tablet (40 mg total) by mouth daily.   lactulose 10 GM/15ML solution Commonly known as: CHRONULAC Take 20 g by mouth every 4 (four) hours as needed for moderate constipation or severe constipation.   pantoprazole 40 MG tablet Commonly known as: PROTONIX Take 1 tablet (40 mg total) by mouth 2 (two) times daily.   rifaximin 550 MG Tabs tablet Commonly known as: XIFAXAN Take 1 tablet (550 mg total) by mouth 2 (two) times daily.   spironolactone 100 MG tablet Commonly known as: ALDACTONE Take 1 tablet (100 mg total) by mouth daily.   tamsulosin 0.4 MG Caps capsule Commonly known as: FLOMAX Take 1 capsule (0.4 mg total) by mouth daily.   thiamine 100 MG tablet Take 1 tablet (100 mg total) by mouth daily.        Follow-up Information     Gladman, Okey Dupre, MD Follow  up in 2 day(s).   Specialty: Internal Medicine Contact information: [address] [place] [address] [place] [address] [phone]                No Known Allergies  The results of significant diagnostics from this hospitalization (including imaging, microbiology, ancillary and laboratory) are listed below for reference.    Microbiology: Recent Results (from the past 240 hour(s))  Resp Panel by RT-PCR (Flu A&B, Covid) Nasopharyngeal Swab     Status: None   Collection Time: 12/11/21 12:57 PM   Specimen: Nasopharyngeal Swab; Nasopharyngeal(NP) swabs in vial transport medium  Result Value Ref Range Status   SARS Coronavirus 2 by RT PCR NEGATIVE NEGATIVE Final    Comment: (NOTE) SARS-CoV-2 target nucleic acids are NOT DETECTED.  The SARS-CoV-2 RNA is generally detectable in upper respiratory specimens during the acute phase of infection. The lowest concentration of SARS-CoV-2 viral copies this assay can detect is 138  copies/mL. A negative result does not preclude SARS-Cov-2 infection and should not be used as the sole basis for treatment or other patient management decisions. A negative result may occur with  improper specimen collection/handling, submission of specimen other than nasopharyngeal swab, presence of viral mutation(s) within the areas targeted by this assay, and inadequate number of viral copies(<138 copies/mL). A negative result must be combined with clinical observations, patient history, and epidemiological information. The expected result is Negative.  Fact Sheet for Patients:  EntrepreneurPulse.com.au  Fact Sheet for Healthcare Providers:  IncredibleEmployment.be  This test is no t yet approved or cleared by the [place] FDA and  has been authorized for detection and/or diagnosis of SARS-CoV-2 by FDA under an Emergency Use Authorization (EUA). This EUA will remain  in effect (meaning this test can be used) for the duration of the COVID-19 declaration under Section 564(b)(1) of the Act, 21 U.S.C.section 360bbb-3(b)(1), unless the authorization is terminated  or revoked sooner.       Influenza A by PCR NEGATIVE NEGATIVE Final   Influenza B by PCR NEGATIVE NEGATIVE Final    Comment: (NOTE) The Xpert Xpress SARS-CoV-2/FLU/RSV plus assay is intended as an aid in the diagnosis of influenza from Nasopharyngeal swab specimens and should not be used as a sole basis for treatment. Nasal washings and aspirates are unacceptable for Xpert Xpress SARS-CoV-2/FLU/RSV testing.  Fact Sheet for Patients: EntrepreneurPulse.com.au  Fact Sheet for Healthcare Providers: IncredibleEmployment.be  This test is not yet approved or cleared by the [place] FDA and has been authorized for detection and/or diagnosis of SARS-CoV-2 by FDA under an Emergency Use Authorization (EUA). This EUA will remain in effect (meaning  this test can be used) for the duration of the COVID-19 declaration under Section 564(b)(1) of the Act, 21 U.S.C. section 360bbb-3(b)(1), unless the authorization is terminated or revoked.  Performed at [facility], [address] [place], [place] [address]   MRSA Next Gen by PCR, Nasal     Status: None   Collection Time: 12/11/21  5:25 PM   Specimen: Nasal Mucosa; Nasal Swab  Result Value Ref Range Status   MRSA by PCR Next Gen NOT DETECTED NOT DETECTED Final    Comment: (NOTE) The GeneXpert MRSA Assay (FDA approved for NASAL specimens only), is one component of a comprehensive MRSA colonization surveillance program. It is not intended to diagnose MRSA infection nor to guide or monitor treatment for MRSA infections. Test performance is not FDA approved in patients less than 60 years old. Performed at [facility], [place], [place] [address]   Resp Panel by RT-PCR (Flu A&B, Covid) Nasopharyngeal Swab     Status: None   Collection Time: 12/18/21  7:54 AM   Specimen: Nasopharyngeal Swab; Nasopharyngeal(NP) swabs in vial transport medium  Result Value Ref Range Status   SARS Coronavirus 2 by RT PCR NEGATIVE NEGATIVE Final    Comment: (NOTE) SARS-CoV-2 target nucleic acids are NOT DETECTED.  The SARS-CoV-2 RNA is generally detectable in upper respiratory specimens during the acute phase of infection. The lowest concentration of SARS-CoV-2 viral copies this assay can detect is 138 copies/mL. A negative result does not preclude SARS-Cov-2 infection and should not be used as the sole basis for treatment or other patient management decisions. A negative result may occur with  improper specimen collection/handling, submission of specimen other than nasopharyngeal swab, presence of viral mutation(s) within the areas targeted by this assay, and inadequate number of viral copies(<138 copies/mL). A negative result must be combined with clinical  observations, patient history, and epidemiological information. The expected result is Negative.  Fact Sheet for Patients:  EntrepreneurPulse.com.au  Fact Sheet for Healthcare Providers:  IncredibleEmployment.be  This test is no t yet approved or cleared by the [place] FDA and  has been authorized for detection and/or diagnosis of SARS-CoV-2 by FDA under an Emergency Use Authorization (EUA). This EUA will remain  in effect (meaning this test can be used) for the duration of the COVID-19 declaration under Section 564(b)(1) of the Act, 21 U.S.C.section 360bbb-3(b)(1), unless the authorization is terminated  or revoked sooner.       Influenza A by PCR NEGATIVE NEGATIVE Final   Influenza B by PCR NEGATIVE NEGATIVE Final    Comment: (NOTE) The Xpert Xpress SARS-CoV-2/FLU/RSV plus assay is intended as an aid in the diagnosis of influenza from Nasopharyngeal swab specimens and should not be used as a sole basis for treatment. Nasal washings and aspirates are unacceptable for Xpert Xpress SARS-CoV-2/FLU/RSV testing.  Fact Sheet for Patients: EntrepreneurPulse.com.au  Fact Sheet for Healthcare Providers: IncredibleEmployment.be  This test is not yet approved or cleared by the [place] FDA and has been authorized for detection and/or diagnosis of SARS-CoV-2 by FDA under an Emergency Use Authorization (EUA). This EUA will remain in effect (meaning this test can be used) for the duration of the COVID-19 declaration under Section 564(b)(1) of the Act, 21 U.S.C. section 360bbb-3(b)(1), unless the authorization is terminated or revoked.  Performed at [facility], [address] [place], [place] [address]   Body fluid culture w Gram Stain     Status: None (Preliminary result)   Collection Time: 12/18/21  2:34 PM   Specimen: PATH Cytology Peritoneal fluid  Result Value Ref Range Status    Specimen Description   Final    PERITONEAL Performed at [facility], [address] [place], [place] [address]    Special Requests   Final    NONE Performed at [facility], [address] [place], [place] [address]    Gram Stain CYTOSPIN SMEAR FEW WBC SEEN NO ORGANISMS SEEN   Final   Culture   Final    NO GROWTH 3 DAYS Performed at [facility], [place] [address] [place], [place] [address]    Report Status PENDING  Incomplete  CULTURE, BLOOD (ROUTINE X 2) w Reflex to ID Panel     Status: None (Preliminary result)   Collection Time: 12/18/21  3:46 PM   Specimen: BLOOD  Result Value Ref Range Status   Specimen Description BLOOD RIGHT ANTECUBITAL  Final   Special Requests   Final    BOTTLES DRAWN AEROBIC AND ANAEROBIC Blood Culture adequate volume   Culture   Final    NO GROWTH 3 DAYS Performed at [facility], [address], [place], [place] [address]    Report Status PENDING  Incomplete  CULTURE, BLOOD (ROUTINE X 2) w Reflex to ID Panel     Status: None (Preliminary result)   Collection Time: 12/18/21  3:47 PM   Specimen: BLOOD  Result Value Ref Range Status   Specimen Description BLOOD BLOOD RIGHT HAND  Final   Special Requests   Final    BOTTLES DRAWN AEROBIC AND ANAEROBIC Blood Culture adequate volume   Culture   Final    NO GROWTH 3 DAYS Performed at [facility], [address], [place], [place] [address]    Report Status PENDING  Incomplete    Procedures/Studies: CT ABDOMEN PELVIS W CONTRAST  Result Date: 12/18/2021 CLINICAL DATA:  Acute, nonlocalized abdominal pain. Vomiting with abdominal pain and swelling EXAM: CT ABDOMEN AND PELVIS WITH CONTRAST TECHNIQUE: Multidetector CT imaging of the abdomen and pelvis was performed using the standard protocol following bolus administration of intravenous contrast. RADIATION DOSE REDUCTION: This exam was performed according to the departmental dose-optimization program which  includes automated exposure control, adjustment of the mA and/or kV according to patient size and/or use of iterative reconstruction technique. CONTRAST:  144mL OMNIPAQUE IOHEXOL 300 MG/ML  SOLN COMPARISON:  04/28/2021 FINDINGS: Lower chest: Lower periesophageal varices. Premature atheromatous calcification of the coronaries Hepatobiliary: Known cirrhosis with large caudate lobe and surface lobulation. Heterogeneous perfusion/fat deposition. No focal mass is seen.Large volume ascites, simple and diffusely distributed, less tense than before. 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 Cholelithiasis. Full gallbladder but no detected inflammatory changes are superimposed. Pancreas: Question pancreas divisum. On axial slices, low-density at the upper pancreatic body is volume averaged fat based on reformats. Spleen: Unremarkable Adrenals/Urinary Tract: Negative adrenals. No hydronephrosis or stone. Unremarkable bladder. Stomach/Bowel:  No obstruction. No appendicitis. Vascular/Lymphatic: Nonocclusive thrombus in the lower main portal vein. No mass or adenopathy. Reproductive:No pathologic findings. Other: No ascites or pneumoperitoneum. Musculoskeletal: No acute abnormalities. IMPRESSION: 1. Cirrhosis with large volume ascites and nonocclusive main portal vein thrombus. 2. Cholelithiasis. Full gallbladder but no focal inflammatory changes. 3.  Periesophageal varices. 4. Premature atherosclerosis including the coronary arteries. Electronically Signed   By: Jorje Guild M.D.   On: 12/18/2021 10:04   US Paracentesis  Result Date: 12/18/2021 INDICATION: Recurrent ascites EXAM: ULTRASOUND GUIDED RLQ PARACENTESIS MEDICATIONS: 10 cc 1% lidocaine. COMPLICATIONS: None immediate. PROCEDURE: Informed written consent was obtained from the patient after a discussion of the risks, benefits and alternatives to treatment. A timeout was performed prior to the initiation of the procedure. Initial ultrasound scanning  demonstrates a large amount of ascites within the right lower abdominal quadrant. The right lower abdomen was prepped and draped in the usual sterile fashion. 1% lidocaine was used for local anesthesia. Following this, a Yueh catheter was introduced. An ultrasound image was saved for documentation purposes. The paracentesis was performed. The catheter was removed and a dressing was applied. The patient tolerated the procedure well without immediate post procedural complication. FINDINGS: A total of approximately 3.9 liters of yellow fluid was removed. Samples were sent to the laboratory as requested by the clinical team. IMPRESSION: Successful ultrasound-guided paracentesis yielding 3.9 liters of peritoneal fluid. Read by Olin Hauser  [facility] Electronically Signed   By: Michaelle Birks M.D.   On: 12/18/2021 19:18   US ABDOMEN LIMITED RUQ (LIVER/GB)  Result Date: 12/18/2021 CLINICAL DATA:  Abdominal pain. EXAM: ULTRASOUND ABDOMEN LIMITED RIGHT UPPER QUADRANT COMPARISON:  None. FINDINGS: Gallbladder: No gallstones or wall thickening visualized. No sonographic Murphy sign noted by sonographer. Common bile duct: Diameter: 4 mm Liver: Decreased liver size. Coarsened echotexture and overall increased echogenicity. Surface nodularity. No defined mass. Portal vein is patent on color Doppler imaging with normal direction of blood flow towards the liver. Other: Moderate ascites. IMPRESSION: 1. No acute findings.  Normal gallbladder.  No bile duct dilation. 2. Findings consistent with cirrhosis and portal venous hypertension, the latter reflected by moderate ascites. No liver mass. Electronically Signed   By: Lajean Manes M.D.   On: 12/18/2021 09:59    Labs: BNP (last 3 results) Recent Labs    04/12/21 1206  BNP [id number]   Basic Metabolic Panel: Recent Labs  Lab 12/18/21 0615 12/19/21 0456 12/20/21 0645 12/21/21 0607  NA 129* 126* 127* 129*  K 4.2 4.9 4.9 4.7  CL 103 103 104 105  CO2 20* 18* 17* 18*  GLUCOSE  220* 201* 154* 148*  BUN 31* 37* 43* 40*  CREATININE 1.52* 1.38* 1.83* 1.38*  CALCIUM 7.9* 8.0* 7.6* 7.7*   Liver Function Tests: Recent Labs  Lab 12/18/21 0615  AST 41  ALT 26  ALKPHOS 164*  BILITOT 2.6*  PROT 6.6  ALBUMIN 2.9*   Recent Labs  Lab 12/18/21 0615  LIPASE 79*   Recent Labs  Lab 12/18/21 1546  AMMONIA 25   CBC: Recent Labs  Lab 12/18/21 0615 12/19/21 0456  WBC 14.6* 10.3  HGB 9.6* 8.4*  HCT 26.3* 24.1*  MCV 100.8* 94.1  PLT 110* 102*   Cardiac Enzymes: No results for input(s): CKTOTAL, CKMB, CKMBINDEX, TROPONINI in the last 168 hours. BNP: Invalid input(s): POCBNP CBG: Recent Labs  Lab 12/20/21 1223 12/20/21 1250 12/20/21 1603 12/20/21 2114 12/21/21 0809  GLUCAP 190* 201* 175* 218* 128*   D-Dimer No results for input(s): DDIMER in the last 72 hours. Hgb A1c No results for input(s): HGBA1C in the last 72 hours. Lipid Profile No results for input(s): CHOL, HDL, LDLCALC, TRIG, CHOLHDL, LDLDIRECT in the last 72 hours. Thyroid function studies No results for input(s): TSH, T4TOTAL, T3FREE, THYROIDAB in the last 72 hours.  Invalid input(s): FREET3 Anemia work up No results for input(s): VITAMINB12, FOLATE, FERRITIN, TIBC, IRON, RETICCTPCT in the last 72 hours. Urinalysis    Component Value Date/Time   COLORURINE AMBER (A) 04/28/2021 1736   APPEARANCEUR CLEAR (A) 04/28/2021 1736   LABSPEC 1.012 04/28/2021 1736   PHURINE 6.0 04/28/2021 1736   GLUCOSEU NEGATIVE 04/28/2021 1736   HGBUR MODERATE (A) 04/28/2021 1736   BILIRUBINUR NEGATIVE 04/28/2021 1736   KETONESUR NEGATIVE 04/28/2021 1736   PROTEINUR 30 (A) 04/28/2021 1736   NITRITE NEGATIVE 04/28/2021 1736   LEUKOCYTESUR NEGATIVE 04/28/2021 1736   Sepsis Labs Invalid input(s): PROCALCITONIN,  WBC,  LACTICIDVEN Microbiology Recent Results (from the past 240 hour(s))  Resp Panel by RT-PCR (Flu A&B, Covid) Nasopharyngeal Swab     Status: None   Collection Time: 12/11/21 12:57 PM    Specimen: Nasopharyngeal Swab; Nasopharyngeal(NP) swabs in vial transport medium  Result Value Ref Range Status   SARS Coronavirus 2 by RT PCR NEGATIVE NEGATIVE Final    Comment: (NOTE) SARS-CoV-2 target nucleic acids are NOT DETECTED.  The SARS-CoV-2 RNA is generally detectable  in upper respiratory specimens during the acute phase of infection. The lowest concentration of SARS-CoV-2 viral copies this assay can detect is 138 copies/mL. A negative result does not preclude SARS-Cov-2 infection and should not be used as the sole basis for treatment or other patient management decisions. A negative result may occur with  improper specimen collection/handling, submission of specimen other than nasopharyngeal swab, presence of viral mutation(s) within the areas targeted by this assay, and inadequate number of viral copies(<138 copies/mL). A negative result must be combined with clinical observations, patient history, and epidemiological information. The expected result is Negative.  Fact Sheet for Patients:  EntrepreneurPulse.com.au  Fact Sheet for Healthcare Providers:  IncredibleEmployment.be  This test is no t yet approved or cleared by the [place] FDA and  has been authorized for detection and/or diagnosis of SARS-CoV-2 by FDA under an Emergency Use Authorization (EUA). This EUA will remain  in effect (meaning this test can be used) for the duration of the COVID-19 declaration under Section 564(b)(1) of the Act, 21 U.S.C.section 360bbb-3(b)(1), unless the authorization is terminated  or revoked sooner.       Influenza A by PCR NEGATIVE NEGATIVE Final   Influenza B by PCR NEGATIVE NEGATIVE Final    Comment: (NOTE) The Xpert Xpress SARS-CoV-2/FLU/RSV plus assay is intended as an aid in the diagnosis of influenza from Nasopharyngeal swab specimens and should not be used as a sole basis for treatment. Nasal washings and aspirates are  unacceptable for Xpert Xpress SARS-CoV-2/FLU/RSV testing.  Fact Sheet for Patients: EntrepreneurPulse.com.au  Fact Sheet for Healthcare Providers: IncredibleEmployment.be  This test is not yet approved or cleared by the [place] FDA and has been authorized for detection and/or diagnosis of SARS-CoV-2 by FDA under an Emergency Use Authorization (EUA). This EUA will remain in effect (meaning this test can be used) for the duration of the COVID-19 declaration under Section 564(b)(1) of the Act, 21 U.S.C. section 360bbb-3(b)(1), unless the authorization is terminated or revoked.  Performed at [facility], [address] [place], [place] [address]   MRSA Next Gen by PCR, Nasal     Status: None   Collection Time: 12/11/21  5:25 PM   Specimen: Nasal Mucosa; Nasal Swab  Result Value Ref Range Status   MRSA by PCR Next Gen NOT DETECTED NOT DETECTED Final    Comment: (NOTE) The GeneXpert MRSA Assay (FDA approved for NASAL specimens only), is one component of a comprehensive MRSA colonization surveillance program. It is not intended to diagnose MRSA infection nor to guide or monitor treatment for MRSA infections. Test performance is not FDA approved in patients less than 18 years old. Performed at [facility], [address] [place], [place] [address]   Resp Panel by RT-PCR (Flu A&B, Covid) Nasopharyngeal Swab     Status: None   Collection Time: 12/18/21  7:54 AM   Specimen: Nasopharyngeal Swab; Nasopharyngeal(NP) swabs in vial transport medium  Result Value Ref Range Status   SARS Coronavirus 2 by RT PCR NEGATIVE NEGATIVE Final    Comment: (NOTE) SARS-CoV-2 target nucleic acids are NOT DETECTED.  The SARS-CoV-2 RNA is generally detectable in upper respiratory specimens during the acute phase of infection. The lowest concentration of SARS-CoV-2 viral copies this assay can detect is 138 copies/mL. A negative result does  not preclude SARS-Cov-2 infection and should not be used as the sole basis for treatment or other patient management decisions. A negative result may occur with  improper specimen collection/handling, submission of  specimen other than nasopharyngeal swab, presence of viral mutation(s) within the areas targeted by this assay, and inadequate number of viral copies(<138 copies/mL). A negative result must be combined with clinical observations, patient history, and epidemiological information. The expected result is Negative.  Fact Sheet for Patients:  EntrepreneurPulse.com.au  Fact Sheet for Healthcare Providers:  IncredibleEmployment.be  This test is no t yet approved or cleared by the [place] FDA and  has been authorized for detection and/or diagnosis of SARS-CoV-2 by FDA under an Emergency Use Authorization (EUA). This EUA will remain  in effect (meaning this test can be used) for the duration of the COVID-19 declaration under Section 564(b)(1) of the Act, 21 U.S.C.section 360bbb-3(b)(1), unless the authorization is terminated  or revoked sooner.       Influenza A by PCR NEGATIVE NEGATIVE Final   Influenza B by PCR NEGATIVE NEGATIVE Final    Comment: (NOTE) The Xpert Xpress SARS-CoV-2/FLU/RSV plus assay is intended as an aid in the diagnosis of influenza from Nasopharyngeal swab specimens and should not be used as a sole basis for treatment. Nasal washings and aspirates are unacceptable for Xpert Xpress SARS-CoV-2/FLU/RSV testing.  Fact Sheet for Patients: EntrepreneurPulse.com.au  Fact Sheet for Healthcare Providers: IncredibleEmployment.be  This test is not yet approved or cleared by the [place] FDA and has been authorized for detection and/or diagnosis of SARS-CoV-2 by FDA under an Emergency Use Authorization (EUA). This EUA will remain in effect (meaning this test can be used) for the  duration of the COVID-19 declaration under Section 564(b)(1) of the Act, 21 U.S.C. section 360bbb-3(b)(1), unless the authorization is terminated or revoked.  Performed at [facility], [address] [place], [place] [address]   Body fluid culture w Gram Stain     Status: None (Preliminary result)   Collection Time: 12/18/21  2:34 PM   Specimen: PATH Cytology Peritoneal fluid  Result Value Ref Range Status   Specimen Description   Final    PERITONEAL Performed at [facility], [address] [place], [place] [address]    Special Requests   Final    NONE Performed at [facility], [address] [place], [place] [address]    Gram Stain CYTOSPIN SMEAR FEW WBC SEEN NO ORGANISMS SEEN   Final   Culture   Final    NO GROWTH 3 DAYS Performed at [facility], [place] [address], [place], [place] [address]    Report Status PENDING  Incomplete  CULTURE, BLOOD (ROUTINE X 2) w Reflex to ID Panel     Status: None (Preliminary result)   Collection Time: 12/18/21  3:46 PM   Specimen: BLOOD  Result Value Ref Range Status   Specimen Description BLOOD RIGHT ANTECUBITAL  Final   Special Requests   Final    BOTTLES DRAWN AEROBIC AND ANAEROBIC Blood Culture adequate volume   Culture   Final    NO GROWTH 3 DAYS Performed at [facility], [address], [place], [place] [address]    Report Status PENDING  Incomplete  CULTURE, BLOOD (ROUTINE X 2) w Reflex to ID Panel     Status: None (Preliminary result)   Collection Time: 12/18/21  3:47 PM   Specimen: BLOOD  Result Value Ref Range Status   Specimen Description BLOOD BLOOD RIGHT HAND  Final   Special Requests   Final    BOTTLES DRAWN AEROBIC AND ANAEROBIC Blood Culture adequate volume   Culture   Final  NO GROWTH 3 DAYS Performed at [facility], [place], [place] [address]    Report Status PENDING  Incomplete     Time coordinating discharge: 25  minutes  SIGNED: Antonieta Pert, MD  [facility] 12/21/2021, 8:41 AM  If 7PM-7AM, please contact night-coverage [email or web address]

## 2021-12-22 LAB — BODY FLUID CULTURE W GRAM STAIN: Culture: NO GROWTH

## 2021-12-24 ENCOUNTER — Encounter: Payer: Self-pay | Admitting: Gastroenterology

## 2021-12-26 LAB — CULTURE, BLOOD (ROUTINE X 2)
Culture: NO GROWTH
Culture: NO GROWTH
Special Requests: ADEQUATE
Special Requests: ADEQUATE

## 2021-12-27 ENCOUNTER — Encounter
Admission: RE | Disposition: A | Discharge status: Home / Self Care | Payer: Self-pay | Source: Home / Self Care | Attending: Gastroenterology

## 2021-12-27 ENCOUNTER — Encounter: Payer: Self-pay | Admitting: Gastroenterology

## 2021-12-27 ENCOUNTER — Ambulatory Visit
Admission: RE | Admit: 2021-12-27 | Discharge: 2021-12-27 | Disposition: A | Discharge status: Home / Self Care | Payer: Self-pay | Attending: Gastroenterology | Admitting: Gastroenterology

## 2021-12-27 ENCOUNTER — Ambulatory Visit: Payer: Self-pay | Admitting: Anesthesiology

## 2021-12-27 ENCOUNTER — Other Ambulatory Visit: Payer: Self-pay

## 2021-12-27 DIAGNOSIS — Z8711 Personal history of peptic ulcer disease: Secondary | ICD-10-CM | POA: Insufficient documentation

## 2021-12-27 DIAGNOSIS — K766 Portal hypertension: Secondary | ICD-10-CM | POA: Insufficient documentation

## 2021-12-27 DIAGNOSIS — E119 Type 2 diabetes mellitus without complications: Secondary | ICD-10-CM | POA: Insufficient documentation

## 2021-12-27 DIAGNOSIS — K7030 Alcoholic cirrhosis of liver without ascites: Secondary | ICD-10-CM | POA: Insufficient documentation

## 2021-12-27 DIAGNOSIS — K3189 Other diseases of stomach and duodenum: Secondary | ICD-10-CM | POA: Insufficient documentation

## 2021-12-27 DIAGNOSIS — I8510 Secondary esophageal varices without bleeding: Secondary | ICD-10-CM | POA: Insufficient documentation

## 2021-12-27 HISTORY — PX: ESOPHAGOGASTRODUODENOSCOPY: SHX5428

## 2021-12-27 HISTORY — DX: Type 2 diabetes mellitus without complications: E11.9

## 2021-12-27 HISTORY — DX: Other amnesia: R41.3

## 2021-12-27 HISTORY — DX: Other ascites: R18.8

## 2021-12-27 SURGERY — EGD (ESOPHAGOGASTRODUODENOSCOPY)
Anesthesia: General

## 2021-12-27 MED ORDER — DEXMEDETOMIDINE HCL IN NACL 200 MCG/50ML IV SOLN
INTRAVENOUS | Status: DC | PRN
  Administered 2021-12-27 (×2): 4 ug via INTRAVENOUS
  Administered 2021-12-27: 8 ug via INTRAVENOUS
  Administered 2021-12-27: 4 ug via INTRAVENOUS

## 2021-12-27 MED ORDER — PROPOFOL 10 MG/ML IV BOLUS
INTRAVENOUS | Status: DC | PRN
  Administered 2021-12-27 (×4): 20 mg via INTRAVENOUS
  Administered 2021-12-27: 80 mg via INTRAVENOUS

## 2021-12-27 MED ORDER — SODIUM CHLORIDE 0.9 % IV SOLN: INTRAVENOUS | Status: DC

## 2021-12-27 NOTE — Op Note (Signed)
[facility] Gastroenterology Patient Name: Shawn Torres Procedure Date: 12/27/2021 11:37 AM MRN: [id number] Account #: [id number] Date of Birth: January 15, 1966 Admit Type: Outpatient Age: 56 Room: [facility] ENDO ROOM 2 Gender: Male Note Status: Finalized Instrument Name: Upper Endoscope [id number] Procedure:             Upper GI endoscopy Indications:           Esophageal varices Providers:             Annamaria Helling DO, DO Medicines:             Monitored Anesthesia Care Complications:         No immediate complications. Estimated blood loss: None. Procedure:             Pre-Anesthesia Assessment:                        - Prior to the procedure, a History and Physical was                         performed, and patient medications and allergies were                         reviewed. The patient is competent. The risks and                         benefits of the procedure and the sedation options and                         risks were discussed with the patient. All questions                         were answered and informed consent was obtained.                         Patient identification and proposed procedure were                         verified by the physician, the nurse, the anesthetist                         and the technician in the endoscopy suite. Mental                         Status Examination: alert and oriented. Airway                         Examination: normal oropharyngeal airway and neck                         mobility. Respiratory Examination: clear to                         auscultation. CV Examination: RRR, no murmurs, no S3                         or S4. Prophylactic Antibiotics: The patient does not  require prophylactic antibiotics. Prior                         Anticoagulants: The patient has taken no previous                         anticoagulant or antiplatelet agents. ASA Grade                         Assessment: IV  - A patient with severe systemic                         disease that is a constant threat to life. After                         reviewing the risks and benefits, the patient was                         deemed in satisfactory condition to undergo the                         procedure. The anesthesia plan was to use monitored                         anesthesia care (MAC). Immediately prior to                         administration of medications, the patient was                         re-assessed for adequacy to receive sedatives. The                         heart rate, respiratory rate, oxygen saturations,                         blood pressure, adequacy of pulmonary ventilation, and                         response to care were monitored throughout the                         procedure. The physical status of the patient was                         re-assessed after the procedure.                        After obtaining informed consent, the endoscope was                         passed under direct vision. Throughout the procedure,                         the patient's blood pressure, pulse, and oxygen                         saturations were monitored continuously. The Endoscope  was introduced through the mouth, and advanced to the                         second part of duodenum. The upper GI endoscopy was                         accomplished without difficulty. The patient tolerated                         the procedure well. Findings:      The duodenal bulb, first portion of the duodenum and second portion of       the duodenum were normal. Estimated blood loss: none.      Mild portal hypertensive gastropathy was found in the gastric body.       Estimated blood loss: none.      The exam of the stomach was otherwise normal.      Esophagogastric landmarks were identified: the gastroesophageal junction       was found at 36 cm from the incisors.      Grade II  varices were found in the distal esophagus. Two bands were       successfully placed with complete eradication, resulting in deflation of       varices. There was no bleeding during the procedure. Estimated blood       loss: none.      The exam was otherwise without abnormality. Impression:            - Normal duodenal bulb, first portion of the duodenum                         and second portion of the duodenum.                        - Portal hypertensive gastropathy.                        - Esophagogastric landmarks identified.                        - Grade II esophageal varices. Completely eradicated.                         Banded.                        - The examination was otherwise normal.                        - No specimens collected. Recommendation:        - Discharge patient to home.                        - Soft diet for 1 day.                        - Continue present medications.                        - Repeat upper endoscopy in 2 weeks for retreatment.                        -  Return to GI office as previously scheduled.                        - The findings and recommendations were discussed with                         the patient.                        - The findings and recommendations were discussed with                         the patient's family. Procedure Code(s):     --- Professional ---                        [phone], Esophagogastroduodenoscopy, flexible,                         transoral; with band ligation of esophageal/gastric                         varices Diagnosis Code(s):     --- Professional ---                        K76.6, Portal hypertension                        K31.89, Other diseases of stomach and duodenum                        I85.00, Esophageal varices without bleeding CPT copyright 2019 American Medical Association. All rights reserved. The codes documented in this report are preliminary and upon coder review may  be revised to meet  current compliance requirements. Attending Participation:      I personally performed the entire procedure. Volney American, DO Annamaria Helling DO, DO 12/27/2021 12:14:24 PM This report has been signed electronically. Number of Addenda: 0 Note Initiated On: 12/27/2021 11:37 AM Estimated Blood Loss:  Estimated blood loss: none.      [facility]

## 2021-12-27 NOTE — Interval H&P Note (Signed)
History and Physical Interval Note: Preprocedure H&P from 12/27/21  was reviewed and there was no interval change after seeing and examining the patient.  Written consent was obtained from the patient after discussion of risks, benefits, and alternatives. Patient has consented to proceed with Esophagogastroduodenoscopy with possible intervention   12/27/2021 11:41 AM  Shawn Torres  has presented today for surgery, with the diagnosis of ALCOHOLIC CIRRHOSIS.  The various methods of treatment have been discussed with the patient and family. After consideration of risks, benefits and other options for treatment, the patient has consented to  Procedure(s) with comments: ESOPHAGOGASTRODUODENOSCOPY (EGD) (N/A) - Spanish Interpreter as a surgical intervention.  The patient's history has been reviewed, patient examined, no change in status, stable for surgery.  I have reviewed the patient's chart and labs.  Questions were answered to the patient's satisfaction.     [name]

## 2021-12-27 NOTE — Anesthesia Preprocedure Evaluation (Signed)
Anesthesia Evaluation  Patient identified by MRN, date of birth, ID band Patient awake  General Assessment Comment:  46M hx alcohol abuse and esophageal varices, prior variceal bleeds, presenting with hematemesis. Received 1u PRBC so far in the ED. On my exam, patient not vomiting or retching, but had been in the ED. Patient able to keep a conversation with me and appeared oriented enough to provide his own consent. Hemodynamically stable  Reviewed: Allergy & Precautions, NPO status , Patient's Chart, lab work & pertinent test results  History of Anesthesia Complications (+) PSEUDOCHOLINESTERASE DEFICIENCY and history of anesthetic complications  Airway Mallampati: III  TM Distance: >3 FB Neck ROM: Full   Comment: Oropharynx appears dry, no signs of blood Dental  (+) Dental Advidsory Given, Poor Dentition   Pulmonary neg pulmonary ROS, neg shortness of breath, neg sleep apnea, neg COPD, neg recent URI, Patient abstained from smoking.Not current smoker,    Pulmonary exam normal breath sounds clear to auscultation       Cardiovascular Exercise Tolerance: Good METS(-) hypertension(-) angina(-) CAD and (-) Past MI negative cardio ROS  (-) dysrhythmias  Rhythm:Regular Rate:Normal - Systolic murmurs    Neuro/Psych negative neurological ROS  negative psych ROS   GI/Hepatic PUD, neg GERD  ,(+) Cirrhosis   Esophageal Varices  (-) substance abuse  ,   Endo/Other  diabetes  Renal/GU CRFRenal disease     Musculoskeletal   Abdominal   Peds  Hematology  (+) Blood dyscrasia, anemia ,   Anesthesia Other Findings Past Medical History: No date: Cirrhosis, alcoholic (HCC)  Reproductive/Obstetrics                             Anesthesia Physical  Anesthesia Plan  ASA: 4  Anesthesia Plan: General   Post-op Pain Management: Minimal or no pain anticipated   Induction: Intravenous  PONV Risk Score and  Plan: 2 and Propofol infusion and TIVA  Airway Management Planned: Natural Airway and Nasal Cannula  Additional Equipment: None  Intra-op Plan:   Post-operative Plan:   Informed Consent: I have reviewed the patients History and Physical, chart, labs and discussed the procedure including the risks, benefits and alternatives for the proposed anesthesia with the patient or authorized representative who has indicated his/her understanding and acceptance.     Dental advisory given  Plan Discussed with: CRNA and Surgeon  Anesthesia Plan Comments: (Discussed risks of anesthesia with patient, including PONV, sore throat, lip/dental/eye damage, aspiration. Rare risks discussed as well, such as cardiorespiratory and neurological sequelae, and allergic reactions. Patient counseled on being higher risk for anesthesia due to comorbidities: active hematemesis. Patient was told about increased risk of cardiac and respiratory events, including death. Discussed the role of CRNA in patient's perioperative care. Patient understands. Discussed possible prolonged intubation (patient was kept intubated after prior EGD due to altered mental status and active vomiting immediately preop).)        Anesthesia Quick Evaluation

## 2021-12-27 NOTE — H&P (Signed)
Pre-Procedure H&P   Patient ID: Shawn Torres is a 56 y.o. male.  Gastroenterology Provider: [name], DO  Referring Provider: Tawni Pummel, PA PCP: Kurtis Bushman, MD  Date: 12/27/2021  HPI [name] is a 56 y.o. male who presents today for Esophagogastroduodenoscopy for surveillance and retreat of esophageal varices.  Pt underwent egd with EVL x4 on 12/13/21. This is a repeat exam for eradication of esophageal varices.  Had some nausea yesterday, but otherwise without any gi sx.  No other acute complaints.  Past Medical History:  Diagnosis Date   Ascites    Cirrhosis, alcoholic (HCC)    Diabetes mellitus without complication (HCC)    Memory loss     Past Surgical History:  Procedure Laterality Date   ESOPHAGOGASTRODUODENOSCOPY N/A 10/10/2021   Procedure: ESOPHAGOGASTRODUODENOSCOPY (EGD);  Surgeon: Midge Minium, MD;  Location: [facility] ENDOSCOPY;  Service: Endoscopy;  Laterality: N/A;   ESOPHAGOGASTRODUODENOSCOPY (EGD) WITH PROPOFOL N/A 12/11/2021   Procedure: ESOPHAGOGASTRODUODENOSCOPY (EGD) WITH PROPOFOL;  Surgeon: [name], DO;  Location: [facility] ENDOSCOPY;  Service: Gastroenterology;  Laterality: N/A;    Family History No h/o GI disease or malignancy  Review of Systems  Constitutional:  Negative for activity change, appetite change, chills, diaphoresis, fatigue, fever and unexpected weight change.  HENT:  Negative for trouble swallowing and voice change.   Respiratory:  Negative for shortness of breath and wheezing.   Cardiovascular:  Negative for chest pain, palpitations and leg swelling.  Gastrointestinal:  Positive for nausea. Negative for abdominal distention, abdominal pain, anal bleeding, blood in stool, constipation, diarrhea and vomiting.  Musculoskeletal:  Negative for arthralgias and myalgias.  Skin:  Negative for color change and pallor.  Neurological:  Negative for dizziness, syncope and weakness.   Psychiatric/Behavioral:  Negative for confusion. The patient is not nervous/anxious.   All other systems reviewed and are negative.   Medications No current facility-administered medications on file prior to encounter.   Current Outpatient Medications on File Prior to Encounter  Medication Sig Dispense Refill   folic acid (FOLVITE) 1 MG tablet Take 1 tablet by mouth daily.     furosemide (LASIX) 40 MG tablet Take 1 tablet (40 mg total) by mouth daily. 30 tablet 2   lactulose (CHRONULAC) 10 GM/15ML solution Take 20 g by mouth every 4 (four) hours as needed for moderate constipation or severe constipation.     pantoprazole (PROTONIX) 40 MG tablet Take 1 tablet (40 mg total) by mouth 2 (two) times daily. 60 tablet 0   spironolactone (ALDACTONE) 100 MG tablet Take 1 tablet (100 mg total) by mouth daily. 30 tablet 2   tamsulosin (FLOMAX) 0.4 MG CAPS capsule Take 1 capsule (0.4 mg total) by mouth daily. 30 capsule 0   feeding supplement (ENSURE ENLIVE / ENSURE PLUS) LIQD Take 237 mLs by mouth 3 (three) times daily between meals. 237 mL 12   Nutritional Supplements (,FEEDING SUPPLEMENT, PROSOURCE PLUS) liquid Take 30 mLs by mouth 2 (two) times daily between meals. 887 mL 1   rifaximin (XIFAXAN) 550 MG TABS tablet Take 1 tablet (550 mg total) by mouth 2 (two) times daily. 60 tablet 0   thiamine 100 MG tablet Take 1 tablet (100 mg total) by mouth daily. 30 tablet 0    Pertinent medications related to GI and procedure were reviewed by me with the patient prior to the procedure   Current Facility-Administered Medications:    0.9 %  sodium chloride infusion, , Intravenous, Continuous, Elfredia Nevins  Casimiro Needle, DO, Last Rate: 20 mL/hr at 12/27/21 1120, New Bag at 12/27/21 1120  sodium chloride 20 mL/hr at 12/27/21 1120       No Known Allergies Allergies were reviewed by me prior to the procedure  Objective    Vitals:   12/27/21 1120  BP: 130/88  Pulse: 78  Resp: 18  Temp: 98.4 F (36.9 C)   TempSrc: Temporal  SpO2: 100%  Weight: 81.6 kg  Height: 5\' 6"  (1.676 m)     Physical Exam Vitals and nursing note reviewed.  Constitutional:      General: He is not in acute distress.    Appearance: Normal appearance. He is not ill-appearing, toxic-appearing or diaphoretic.  HENT:     Head: Normocephalic and atraumatic.     Nose: Nose normal.     Mouth/Throat:     Mouth: Mucous membranes are moist.     Pharynx: Oropharynx is clear.  Eyes:     General: No scleral icterus.    Extraocular Movements: Extraocular movements intact.  Cardiovascular:     Rate and Rhythm: Normal rate and regular rhythm.     Heart sounds: Normal heart sounds. No murmur heard.   No friction rub. No gallop.  Pulmonary:     Effort: Pulmonary effort is normal. No respiratory distress.     Breath sounds: Normal breath sounds. No wheezing, rhonchi or rales.  Abdominal:     General: Bowel sounds are normal. There is no distension.     Palpations: Abdomen is soft.     Tenderness: There is no abdominal tenderness. There is no guarding or rebound.  Musculoskeletal:     Cervical back: Neck supple.     Right lower leg: No edema.     Left lower leg: No edema.  Skin:    General: Skin is warm and dry.     Coloration: Skin is not jaundiced or pale.  Neurological:     General: No focal deficit present.     Mental Status: He is alert and oriented to person, place, and time. Mental status is at baseline.  Psychiatric:        Mood and Affect: Mood normal.        Behavior: Behavior normal.        Thought Content: Thought content normal.        Judgment: Judgment normal.     Assessment:  [name] is a 56 y.o. male  who presents today for Esophagogastroduodenoscopy for esophageal varices.  Plan:  Esophagogastroduodenoscopy with possible intervention today  Esophagogastroduodenoscopy with possible biopsy, control of bleeding, polypectomy, and interventions as necessary has been discussed with the  patient/patient representative. Informed consent was obtained from the patient/patient representative after explaining the indication, nature, and risks of the procedure including but not limited to death, bleeding, perforation, missed neoplasm/lesions, cardiorespiratory compromise, and reaction to medications. Opportunity for questions was given and appropriate answers were provided. Patient/patient representative has verbalized understanding is amenable to undergoing the procedure.   40, DO  [facility] Gastroenterology  Portions of the record may have been created with voice recognition software. Occasional wrong-word or 'sound-a-like' substitutions may have occurred due to the inherent limitations of voice recognition software.  Read the chart carefully and recognize, using context, where substitutions may have occurred.

## 2021-12-27 NOTE — Transfer of Care (Signed)
Immediate Anesthesia Transfer of Care Note  Patient: Shawn Torres  Procedure(s) Performed: ESOPHAGOGASTRODUODENOSCOPY (EGD)  Patient Location: PACU and Endoscopy Unit  Anesthesia Type:General  Level of Consciousness: drowsy  Airway & Oxygen Therapy: Patient Spontanous Breathing  Post-op Assessment: Report given to RN and Post -op Vital signs reviewed and stable  Post vital signs: Reviewed and stable  Last Vitals:  Vitals Value Taken Time  BP 105/70 12/27/21 1210  Temp 36.6 C 12/27/21 1210  Pulse 71 12/27/21 1212  Resp 19 12/27/21 1212  SpO2 99 % 12/27/21 1212  Vitals shown include unvalidated device data.  Last Pain:  Vitals:   12/27/21 1210  TempSrc: Temporal  PainSc: Asleep         Complications: No notable events documented.

## 2021-12-28 ENCOUNTER — Encounter: Payer: Self-pay | Admitting: Gastroenterology

## 2021-12-28 NOTE — Anesthesia Postprocedure Evaluation (Signed)
Anesthesia Post Note  Patient: Shawn Torres  Procedure(s) Performed: ESOPHAGOGASTRODUODENOSCOPY (EGD)  Patient location during evaluation: Endoscopy Anesthesia Type: General Level of consciousness: awake and alert Pain management: pain level controlled Vital Signs Assessment: post-procedure vital signs reviewed and stable Respiratory status: spontaneous breathing, nonlabored ventilation, respiratory function stable and patient connected to nasal cannula oxygen Cardiovascular status: blood pressure returned to baseline and stable Postop Assessment: no apparent nausea or vomiting Anesthetic complications: no   No notable events documented.   Last Vitals:  Vitals:   12/27/21 1210 12/27/21 1240  BP: 105/70 112/75  Pulse:    Resp: 16   Temp: 36.6 C   SpO2: 99%     Last Pain:  Vitals:   12/27/21 1240  TempSrc:   PainSc: 0-No pain                 Lenard Simmer

## 2021-12-30 ENCOUNTER — Emergency Department: Payer: Medicaid Other

## 2021-12-30 ENCOUNTER — Inpatient Hospital Stay
Admission: EM | Admit: 2021-12-30 | Discharge: 2022-01-03 | DRG: 442 | Disposition: A | Discharge status: Home / Self Care | Payer: Medicaid Other | Attending: Internal Medicine | Admitting: Internal Medicine

## 2021-12-30 ENCOUNTER — Other Ambulatory Visit: Payer: Self-pay

## 2021-12-30 DIAGNOSIS — E871 Hypo-osmolality and hyponatremia: Secondary | ICD-10-CM | POA: Diagnosis present

## 2021-12-30 DIAGNOSIS — E1165 Type 2 diabetes mellitus with hyperglycemia: Secondary | ICD-10-CM | POA: Diagnosis present

## 2021-12-30 DIAGNOSIS — R251 Tremor, unspecified: Secondary | ICD-10-CM | POA: Diagnosis present

## 2021-12-30 DIAGNOSIS — G4700 Insomnia, unspecified: Secondary | ICD-10-CM | POA: Diagnosis present

## 2021-12-30 DIAGNOSIS — D689 Coagulation defect, unspecified: Secondary | ICD-10-CM | POA: Diagnosis present

## 2021-12-30 DIAGNOSIS — R188 Other ascites: Secondary | ICD-10-CM

## 2021-12-30 DIAGNOSIS — N400 Enlarged prostate without lower urinary tract symptoms: Secondary | ICD-10-CM | POA: Diagnosis present

## 2021-12-30 DIAGNOSIS — Z23 Encounter for immunization: Secondary | ICD-10-CM | POA: Diagnosis not present

## 2021-12-30 DIAGNOSIS — R509 Fever, unspecified: Secondary | ICD-10-CM | POA: Diagnosis not present

## 2021-12-30 DIAGNOSIS — K7031 Alcoholic cirrhosis of liver with ascites: Secondary | ICD-10-CM | POA: Diagnosis present

## 2021-12-30 DIAGNOSIS — D6959 Other secondary thrombocytopenia: Secondary | ICD-10-CM | POA: Diagnosis present

## 2021-12-30 DIAGNOSIS — E872 Acidosis, unspecified: Secondary | ICD-10-CM | POA: Diagnosis present

## 2021-12-30 DIAGNOSIS — Z79899 Other long term (current) drug therapy: Secondary | ICD-10-CM

## 2021-12-30 DIAGNOSIS — D649 Anemia, unspecified: Secondary | ICD-10-CM | POA: Diagnosis present

## 2021-12-30 DIAGNOSIS — K7682 Hepatic encephalopathy: Principal | ICD-10-CM | POA: Diagnosis present

## 2021-12-30 DIAGNOSIS — E8770 Fluid overload, unspecified: Secondary | ICD-10-CM | POA: Diagnosis present

## 2021-12-30 DIAGNOSIS — I10 Essential (primary) hypertension: Secondary | ICD-10-CM | POA: Diagnosis present

## 2021-12-30 DIAGNOSIS — Z888 Allergy status to other drugs, medicaments and biological substances status: Secondary | ICD-10-CM | POA: Diagnosis not present

## 2021-12-30 DIAGNOSIS — Z20822 Contact with and (suspected) exposure to covid-19: Secondary | ICD-10-CM | POA: Diagnosis present

## 2021-12-30 LAB — CBC WITH DIFFERENTIAL/PLATELET
Abs Immature Granulocytes: 0.02 10*3/uL (ref 0.00–0.07)
Basophils Absolute: 0.1 10*3/uL (ref 0.0–0.1)
Basophils Relative: 1 %
Eosinophils Absolute: 0.1 10*3/uL (ref 0.0–0.5)
Eosinophils Relative: 1 %
HCT: 25.2 % — ABNORMAL LOW (ref 39.0–52.0)
Hemoglobin: 9.1 g/dL — ABNORMAL LOW (ref 13.0–17.0)
Immature Granulocytes: 0 %
Lymphocytes Relative: 21 %
Lymphs Abs: 1.1 10*3/uL (ref 0.7–4.0)
MCH: 34.2 pg — ABNORMAL HIGH (ref 26.0–34.0)
MCHC: 36.1 g/dL — ABNORMAL HIGH (ref 30.0–36.0)
MCV: 94.7 fL (ref 80.0–100.0)
Monocytes Absolute: 0.6 10*3/uL (ref 0.1–1.0)
Monocytes Relative: 11 %
Neutro Abs: 3.5 10*3/uL (ref 1.7–7.7)
Neutrophils Relative %: 66 %
Platelets: 125 10*3/uL — ABNORMAL LOW (ref 150–400)
RBC: 2.66 MIL/uL — ABNORMAL LOW (ref 4.22–5.81)
RDW: 16.7 % — ABNORMAL HIGH (ref 11.5–15.5)
WBC: 5.3 10*3/uL (ref 4.0–10.5)
nRBC: 0 % (ref 0.0–0.2)

## 2021-12-30 LAB — COMPREHENSIVE METABOLIC PANEL
ALT: 23 U/L (ref 0–44)
AST: 28 U/L (ref 15–41)
Albumin: 3 g/dL — ABNORMAL LOW (ref 3.5–5.0)
Alkaline Phosphatase: 157 U/L — ABNORMAL HIGH (ref 38–126)
Anion gap: 6 (ref 5–15)
BUN: 18 mg/dL (ref 6–20)
CO2: 16 mmol/L — ABNORMAL LOW (ref 22–32)
Calcium: 8.2 mg/dL — ABNORMAL LOW (ref 8.9–10.3)
Chloride: 106 mmol/L (ref 98–111)
Creatinine, Ser: 1.16 mg/dL (ref 0.61–1.24)
GFR, Estimated: >60 mL/min (ref >60)
Glucose, Bld: 225 mg/dL — ABNORMAL HIGH (ref 70–99)
Potassium: 4.2 mmol/L (ref 3.5–5.1)
Sodium: 128 mmol/L — ABNORMAL LOW (ref 135–145)
Total Bilirubin: 2.3 mg/dL — ABNORMAL HIGH (ref 0.3–1.2)
Total Protein: 6.7 g/dL (ref 6.5–8.1)

## 2021-12-30 LAB — AMMONIA: Ammonia: 150 umol/L — ABNORMAL HIGH (ref 9–35)

## 2021-12-30 LAB — PROTIME-INR
INR: 1.8 — ABNORMAL HIGH (ref 0.8–1.2)
Prothrombin Time: 20.9 seconds — ABNORMAL HIGH (ref 11.4–15.2)

## 2021-12-30 LAB — RESP PANEL BY RT-PCR (FLU A&B, COVID) ARPGX2
Influenza A by PCR: NEGATIVE
Influenza B by PCR: NEGATIVE
SARS Coronavirus 2 by RT PCR: NEGATIVE

## 2021-12-30 LAB — APTT: aPTT: 39 seconds — ABNORMAL HIGH (ref 24–36)

## 2021-12-30 LAB — LIPASE, BLOOD: Lipase: 44 U/L (ref 11–51)

## 2021-12-30 MED ORDER — LACTULOSE 10 GM/15ML PO SOLN
30.0000 g | Freq: Once | ORAL | Status: AC
  Administered 2021-12-30: 30 g via ORAL
  Filled 2021-12-30 (×2): qty 60

## 2021-12-30 MED ORDER — SODIUM CHLORIDE 0.9 % IV BOLUS
500.0000 mL | Freq: Once | INTRAVENOUS | Status: AC
Start: 2021-12-30 — End: 2021-12-30
  Administered 2021-12-30: 500 mL via INTRAVENOUS

## 2021-12-30 MED ORDER — ONDANSETRON HCL 4 MG/2ML IJ SOLN
4.0000 mg | Freq: Once | INTRAMUSCULAR | Status: AC
  Administered 2021-12-30: 4 mg via INTRAVENOUS
  Filled 2021-12-30: qty 2

## 2021-12-30 NOTE — ED Provider Notes (Signed)
[facility] Provider Note    Event Date/Time   First MD Initiated Contact with Patient 12/30/21 2222     (approximate)   History   Emesis and Insomnia  Level V Caveat: AMS  HPI  Shawn Torres is a 56 y.o. male history of alcoholic cirrhosis presents to the ER for tremulousness as well as insomnia for 3 days.  Has had some nausea vomiting no hematemesis no melena.  Denies any falls.  Denies any fevers or chills.  Patient is somewhat of a poor historian.     Physical Exam   Triage Vital Signs: ED Triage Vitals  Enc Vitals Group     BP 12/30/21 2114 (!) 137/92     Pulse Rate 12/30/21 2114 (!) 107     Resp 12/30/21 2114 20     Temp 12/30/21 2114 98.8 F (37.1 C)     Temp Source 12/30/21 2114 Oral     SpO2 12/30/21 2114 100 %     Weight --      Height --      Head Circumference --      Peak Flow --      Pain Score 12/30/21 2116 0     Pain Loc --      Pain Edu? --      Excl. in GC? --     Most recent vital signs: Vitals:   12/30/21 2230 12/30/21 2324  BP: (!) 143/84 (!) 141/86  Pulse: 96 88  Resp: 18 17  Temp:    SpO2: 98% 100%     Constitutional: Alert but encephalopathic Eyes: Conjunctivae are normal.  Head: Atraumatic. Nose: No congestion/rhinnorhea. Mouth/Throat: Mucous membranes are moist.   Neck: Painless ROM.  Cardiovascular:   Good peripheral circulation. Respiratory: Normal respiratory effort.  No retractions.  Gastrointestinal: Soft, distended with + fluid wave Musculoskeletal:  no deformity Neurologic:  MAE spontaneously. No gross focal neurologic deficits are appreciated. + asterixis Skin:  Skin is warm, dry and intact. No rash noted. Psychiatric: calm and cooperative    ED Results / Procedures / Treatments   Labs (all labs ordered are listed, but only abnormal results are displayed) Labs Reviewed  CBC WITH DIFFERENTIAL/PLATELET - Abnormal; Notable for the following components:      Result Value   RBC 2.66  (*)    Hemoglobin 9.1 (*)    HCT 25.2 (*)    MCH 34.2 (*)    MCHC 36.1 (*)    RDW 16.7 (*)    Platelets 125 (*)    All other components within normal limits  COMPREHENSIVE METABOLIC PANEL - Abnormal; Notable for the following components:   Sodium 128 (*)    CO2 16 (*)    Glucose, Bld 225 (*)    Calcium 8.2 (*)    Albumin 3.0 (*)    Alkaline Phosphatase 157 (*)    Total Bilirubin 2.3 (*)    All other components within normal limits  PROTIME-INR - Abnormal; Notable for the following components:   Prothrombin Time 20.9 (*)    INR 1.8 (*)    All other components within normal limits  APTT - Abnormal; Notable for the following components:   aPTT 39 (*)    All other components within normal limits  AMMONIA - Abnormal; Notable for the following components:   Ammonia 150 (*)    All other components within normal limits  RESP PANEL BY RT-PCR (FLU A&B, COVID) [id number]  LIPASE, BLOOD  EKG     RADIOLOGY Please see ED Course for my review and interpretation.  I personally reviewed all radiographic images ordered to evaluate for the above acute complaints and reviewed radiology reports and findings.  These findings were personally discussed with the patient.  Please see medical record for radiology report.    PROCEDURES:  Critical Care performed: Yes, see critical care procedure note(s)  .Critical Care Performed by: Willy Eddy, MD Authorized by: Willy Eddy, MD   Critical care provider statement:    Critical care time (minutes):  35   Critical care was necessary to treat or prevent imminent or life-threatening deterioration of the following conditions:  Hepatic failure   Critical care was time spent personally by me on the following activities:  Ordering and performing treatments and interventions, ordering and review of laboratory studies, ordering and review of radiographic studies, pulse oximetry, re-evaluation of patient's condition, review of old charts,  obtaining history from patient or surrogate, examination of patient, evaluation of patient's response to treatment, discussions with primary provider, discussions with consultants and development of treatment plan with patient or surrogate   MEDICATIONS ORDERED IN ED: Medications  ondansetron (ZOFRAN) injection 4 mg (4 mg Intravenous Given 12/30/21 2116)  sodium chloride 0.9 % bolus 500 mL (500 mLs Intravenous New Bag/Given 12/30/21 2114)  lactulose (CHRONULAC) 10 GM/15ML solution 30 g (30 g Oral Given 12/30/21 2327)     IMPRESSION / MDM / ASSESSMENT AND PLAN / ED COURSE  I reviewed the triage vital signs and the nursing notes.                              Differential diagnosis includes, but is not limited to, hepatic encephalopathy, Dehydration, sepsis, pna, uti, hypoglycemia, cva, drug effect, withdrawal, encephalitis  Patient with history of psoriasis presents to the ER for insomnia increasing tremulousness and confusion.  Per daughter at bedside no reported trauma no fevers.  Patient become increasingly confused and is relying on taking his medications.  Have high suspicion for hepatic encephalopathy based on his presentation is does have asterixis.  Blood work does show evidence of mild hyponatremia significantly elevated ammonia levels.  Not febrile no significant white count.  His abdominal exam is nontender.  CT imaging of the head ordered due to encephalopathy concern for possible subdural or bleed.     Clinical Course as of 12/31/21 0003  Thu Dec 30, 2021  2309 CT head by my review does not show evidence of SDH. [PR]  2334 CT imaging without acute abnormality.  Given findings consistent with hepatic encephalopathy I do feel require hospitalization. [PR]  2345 Case discussed in consultation with hospitalist service who has accepted patient to their service. [PR]    Clinical Course User Index [PR] Willy Eddy, MD     FINAL CLINICAL IMPRESSION(S) / ED DIAGNOSES   Final  diagnoses:  Hepatic encephalopathy     Rx / DC Orders   ED Discharge Orders     None        Note:  This document was prepared using Dragon voice recognition software and may include unintentional dictation errors.    Willy Eddy, MD 12/31/21 [id number]

## 2021-12-30 NOTE — ED Notes (Signed)
Pt transported to CT via stretcher with CT tech. °

## 2021-12-30 NOTE — ED Notes (Signed)
Pt here for N/V, insomnia & intermittent confusion.

## 2021-12-30 NOTE — ED Triage Notes (Signed)
Pt presents via POV with daughter for complaints of insomnia, nausea, and vomiting. Per daughter, the patient has had intermittent confusion. Denies alcohol use or CP. Pt actively vomiting - Pt discussed with Dr. Fuller Plan - see orders.   Information obtained using video interpreter.

## 2021-12-31 ENCOUNTER — Inpatient Hospital Stay: Payer: Medicaid Other

## 2021-12-31 ENCOUNTER — Encounter: Payer: Self-pay | Admitting: Family Medicine

## 2021-12-31 DIAGNOSIS — E871 Hypo-osmolality and hyponatremia: Secondary | ICD-10-CM

## 2021-12-31 DIAGNOSIS — K7210 Chronic hepatic failure without coma: Secondary | ICD-10-CM

## 2021-12-31 DIAGNOSIS — K7682 Hepatic encephalopathy: Principal | ICD-10-CM

## 2021-12-31 DIAGNOSIS — K7031 Alcoholic cirrhosis of liver with ascites: Secondary | ICD-10-CM

## 2021-12-31 LAB — COMPREHENSIVE METABOLIC PANEL
ALT: 20 U/L (ref 0–44)
AST: 27 U/L (ref 15–41)
Albumin: 2.6 g/dL — ABNORMAL LOW (ref 3.5–5.0)
Alkaline Phosphatase: 123 U/L (ref 38–126)
Anion gap: 4 — ABNORMAL LOW (ref 5–15)
BUN: 18 mg/dL (ref 6–20)
CO2: 16 mmol/L — ABNORMAL LOW (ref 22–32)
Calcium: 7.9 mg/dL — ABNORMAL LOW (ref 8.9–10.3)
Chloride: 110 mmol/L (ref 98–111)
Creatinine, Ser: 1.01 mg/dL (ref 0.61–1.24)
GFR, Estimated: >60 mL/min (ref >60)
Glucose, Bld: 136 mg/dL — ABNORMAL HIGH (ref 70–99)
Potassium: 4 mmol/L (ref 3.5–5.1)
Sodium: 130 mmol/L — ABNORMAL LOW (ref 135–145)
Total Bilirubin: 1.8 mg/dL — ABNORMAL HIGH (ref 0.3–1.2)
Total Protein: 5.6 g/dL — ABNORMAL LOW (ref 6.5–8.1)

## 2021-12-31 LAB — CBC
HCT: 23.4 % — ABNORMAL LOW (ref 39.0–52.0)
Hemoglobin: 8.2 g/dL — ABNORMAL LOW (ref 13.0–17.0)
MCH: 32.7 pg (ref 26.0–34.0)
MCHC: 35 g/dL (ref 30.0–36.0)
MCV: 93.2 fL (ref 80.0–100.0)
Platelets: 120 10*3/uL — ABNORMAL LOW (ref 150–400)
RBC: 2.51 MIL/uL — ABNORMAL LOW (ref 4.22–5.81)
RDW: 16.5 % — ABNORMAL HIGH (ref 11.5–15.5)
WBC: 4.6 10*3/uL (ref 4.0–10.5)
nRBC: 0 % (ref 0.0–0.2)

## 2021-12-31 LAB — CBG MONITORING, ED
Glucose-Capillary: 120 mg/dL — ABNORMAL HIGH (ref 70–99)
Glucose-Capillary: 162 mg/dL — ABNORMAL HIGH (ref 70–99)

## 2021-12-31 LAB — GLUCOSE, CAPILLARY
Glucose-Capillary: 184 mg/dL — ABNORMAL HIGH (ref 70–99)
Glucose-Capillary: 173 mg/dL — ABNORMAL HIGH (ref 70–99)

## 2021-12-31 LAB — HEMOGLOBIN A1C
Hgb A1c MFr Bld: 5.1 % (ref 4.8–5.6)
Mean Plasma Glucose: 99.67 mg/dL

## 2021-12-31 LAB — AMMONIA: Ammonia: 59 umol/L — ABNORMAL HIGH (ref 9–35)

## 2021-12-31 MED ORDER — PROSOURCE PLUS PO LIQD
30.0000 mL | Freq: Two times a day (BID) | ORAL | Status: DC
  Administered 2021-12-31 – 2022-01-02 (×5): 30 mL via ORAL
  Filled 2021-12-31 (×8): qty 30

## 2021-12-31 MED ORDER — TAMSULOSIN HCL 0.4 MG PO CAPS
0.4000 mg | ORAL_CAPSULE | Freq: Every day | ORAL | Status: DC
  Administered 2021-12-31 – 2022-01-03 (×4): 0.4 mg via ORAL
  Filled 2021-12-31 (×4): qty 1

## 2021-12-31 MED ORDER — INSULIN ASPART 100 UNIT/ML IJ SOLN
0.0000 [IU] | Freq: Three times a day (TID) | INTRAMUSCULAR | Status: DC
  Administered 2021-12-31 (×2): 3 [IU] via SUBCUTANEOUS
  Administered 2022-01-01 (×2): 5 [IU] via SUBCUTANEOUS
  Administered 2022-01-01 – 2022-01-03 (×5): 3 [IU] via SUBCUTANEOUS
  Administered 2022-01-03: 2 [IU] via SUBCUTANEOUS
  Filled 2021-12-31 (×10): qty 1

## 2021-12-31 MED ORDER — LACTULOSE 10 GM/15ML PO SOLN
30.0000 g | Freq: Three times a day (TID) | ORAL | Status: DC
  Administered 2021-12-31 – 2022-01-03 (×10): 30 g via ORAL
  Filled 2021-12-31 (×10): qty 60

## 2021-12-31 MED ORDER — ENOXAPARIN SODIUM 40 MG/0.4ML IJ SOSY
40.0000 mg | PREFILLED_SYRINGE | INTRAMUSCULAR | Status: DC
  Administered 2021-12-31 – 2022-01-03 (×4): 40 mg via SUBCUTANEOUS
  Filled 2021-12-31 (×4): qty 0.4

## 2021-12-31 MED ORDER — THIAMINE HCL 100 MG/ML IJ SOLN
Freq: Once | INTRAVENOUS | Status: AC
  Filled 2021-12-31: qty 1000

## 2021-12-31 MED ORDER — FUROSEMIDE 40 MG PO TABS
40.0000 mg | ORAL_TABLET | Freq: Every day | ORAL | Status: DC
  Administered 2021-12-31 – 2022-01-03 (×4): 40 mg via ORAL
  Filled 2021-12-31 (×4): qty 1

## 2021-12-31 MED ORDER — SPIRONOLACTONE 25 MG PO TABS
100.0000 mg | ORAL_TABLET | Freq: Every day | ORAL | Status: DC
  Administered 2021-12-31 – 2022-01-03 (×4): 100 mg via ORAL
  Filled 2021-12-31 (×4): qty 4

## 2021-12-31 MED ORDER — PANTOPRAZOLE SODIUM 40 MG PO TBEC
40.0000 mg | DELAYED_RELEASE_TABLET | Freq: Two times a day (BID) | ORAL | Status: DC
  Administered 2021-12-31 – 2022-01-03 (×7): 40 mg via ORAL
  Filled 2021-12-31 (×7): qty 1

## 2021-12-31 MED ORDER — PNEUMOCOCCAL VAC POLYVALENT 25 MCG/0.5ML IJ INJ: 0.5000 mL | INJECTION | INTRAMUSCULAR | Status: DC

## 2021-12-31 MED ORDER — TRAZODONE HCL 50 MG PO TABS
25.0000 mg | ORAL_TABLET | Freq: Every evening | ORAL | Status: DC | PRN
  Administered 2022-01-02: 22:00:00 25 mg via ORAL
  Filled 2021-12-31: qty 1

## 2021-12-31 MED ORDER — ONDANSETRON HCL 4 MG PO TABS: 4.0000 mg | ORAL_TABLET | Freq: Four times a day (QID) | ORAL | Status: DC | PRN

## 2021-12-31 MED ORDER — MAGNESIUM HYDROXIDE 400 MG/5ML PO SUSP: 30.0000 mL | Freq: Every day | ORAL | Status: DC | PRN

## 2021-12-31 MED ORDER — ACETAMINOPHEN 325 MG PO TABS
650.0000 mg | ORAL_TABLET | Freq: Four times a day (QID) | ORAL | Status: DC | PRN
  Administered 2021-12-31 – 2022-01-01 (×3): 650 mg via ORAL
  Filled 2021-12-31 (×3): qty 2

## 2021-12-31 MED ORDER — ONDANSETRON HCL 4 MG/2ML IJ SOLN: 4.0000 mg | Freq: Four times a day (QID) | INTRAMUSCULAR | Status: DC | PRN

## 2021-12-31 MED ORDER — ACETAMINOPHEN 650 MG RE SUPP: 650.0000 mg | Freq: Four times a day (QID) | RECTAL | Status: DC | PRN

## 2021-12-31 MED ORDER — RIFAXIMIN 550 MG PO TABS
550.0000 mg | ORAL_TABLET | Freq: Two times a day (BID) | ORAL | Status: DC
  Administered 2021-12-31 – 2022-01-03 (×7): 550 mg via ORAL
  Filled 2021-12-31 (×8): qty 1

## 2021-12-31 MED ORDER — ENSURE ENLIVE PO LIQD
237.0000 mL | Freq: Three times a day (TID) | ORAL | Status: DC
  Administered 2021-12-31 – 2022-01-02 (×6): 237 mL via ORAL

## 2021-12-31 NOTE — ED Notes (Signed)
Pt ambulatory to the bathroom with minimal assistance 

## 2021-12-31 NOTE — Progress Notes (Signed)
Patient's temp elevated and was 102.3. Patient has no other complaints and all other vital signs stable. Notified NP on call and clarified as to whether patient could be administered Tylenol for fever. NP stated it was alright to administer and stated liver enzymes are WNL. Used the interpreter to educate patient on fever. Patient sleeps with 5+ blankets and a coat. Patient also refuses to take clothes off and put on a gown. Blankets removed and only sheet on at this time.

## 2021-12-31 NOTE — Plan of Care (Signed)
Transport request placed to move to 129

## 2021-12-31 NOTE — Plan of Care (Signed)

## 2021-12-31 NOTE — Progress Notes (Addendum)
PROGRESS NOTE    Shawn Torres  JJK:[id number] DOB: 08/11/1966 DOA: 12/30/2021 PCP: Kurtis Bushman, MD   Assessment & Plan:   Principal Problem:   Hepatic encephalopathy  Acute hepatic encephalopathy: likely secondary to cirrhosis. Continue on lactulose, rifaximin. Ammonia is still elevated but trending down. CT head shows no acute intracranial abnormalities   Alcohol liver cirrhosis: w/ ascites. Continue on lasix, aldactone. Alcohol cessation counseling   Thrombocytopenia: likely secondary to bone marrow suppression from cirrhosis  Normocytic anemia: likely secondary to bone marrow suppression from cirrhosis   Hyperbilirubinemia: likely secondary to cirrhosis.  BPH: continue on flomax  Hyponatremia: likely hypervolemic from cirrhosis. Trending up today.   Metabolic acidosis: likely secondary to liver cirrhosis w/ ascites   DM2: likely poorly controlled. Continue on SSI w/ accuchecks    DVT prophylaxis: SCDs Code Status: full  Family Communication: no family at bedside  Disposition Plan: likely d/c back home   Level of care: Telemetry Medical  Status is: Inpatient Remains inpatient appropriate because: severity of illness      Consultants:    Procedures:   Antimicrobials:    Subjective: Pt c/o malaise  Objective: Vitals:   12/30/21 2324 12/31/21 0100 12/31/21 0214 12/31/21 0620  BP: (!) 141/86 126/69 (!) 137/91 (!) 145/84  Pulse: 88 84 92 86  Resp: 17 18 18 17   Temp:    99.7 F (37.6 C)  TempSrc:    Oral  SpO2: 100% 98% 99% 99%   No intake or output data in the 24 hours ending 12/31/21 0659 There were no vitals filed for this visit.  Examination:  General exam: Appears calm and comfortable  Respiratory system: Clear to auscultation. Respiratory effort normal. Cardiovascular system: S1 & S2 +. No rubs, gallops or clicks.  Gastrointestinal system: Abdomen is distended, soft and nontender. Hyperactive bowel sounds heard. Central nervous  system: Alert and awake. Moves all extremities  Psychiatry: Judgement and insight appears not at baseline. Flat mood and affect    Data Reviewed: I have personally reviewed following labs and imaging studies  CBC: Recent Labs  Lab 12/30/21 2115 12/31/21 0620  WBC 5.3 4.6  NEUTROABS 3.5  --   HGB 9.1* 8.2*  HCT 25.2* 23.4*  MCV 94.7 93.2  PLT 125* 120*   Basic Metabolic Panel: Recent Labs  Lab 12/30/21 2115  NA 128*  K 4.2  CL 106  CO2 16*  GLUCOSE 225*  BUN 18  CREATININE 1.16  CALCIUM 8.2*   GFR: Estimated Creatinine Clearance: 73.9 mL/min (by C-G formula based on SCr of 1.16 mg/dL). Liver Function Tests: Recent Labs  Lab 12/30/21 2115  AST 28  ALT 23  ALKPHOS 157*  BILITOT 2.3*  PROT 6.7  ALBUMIN 3.0*   Recent Labs  Lab 12/30/21 2115  LIPASE 44   Recent Labs  Lab 12/30/21 2115  AMMONIA 150*   Coagulation Profile: Recent Labs  Lab 12/30/21 2115  INR 1.8*   Cardiac Enzymes: No results for input(s): CKTOTAL, CKMB, CKMBINDEX, TROPONINI in the last 168 hours. BNP (last 3 results) No results for input(s): PROBNP in the last 8760 hours. HbA1C: No results for input(s): HGBA1C in the last 72 hours. CBG: No results for input(s): GLUCAP in the last 168 hours. Lipid Profile: No results for input(s): CHOL, HDL, LDLCALC, TRIG, CHOLHDL, LDLDIRECT in the last 72 hours. Thyroid Function Tests: No results for input(s): TSH, T4TOTAL, FREET4, T3FREE, THYROIDAB in the last 72 hours. Anemia Panel: No results for input(s): VITAMINB12, FOLATE, FERRITIN,  TIBC, IRON, RETICCTPCT in the last 72 hours. Sepsis Labs: No results for input(s): PROCALCITON, LATICACIDVEN in the last 168 hours.  Recent Results (from the past 240 hour(s))  Resp Panel by RT-PCR (Flu A&B, Covid) Nasopharyngeal Swab     Status: None   Collection Time: 12/30/21  9:15 PM   Specimen: Nasopharyngeal Swab; Nasopharyngeal(NP) swabs in vial transport medium  Result Value Ref Range Status   SARS  Coronavirus 2 by RT PCR NEGATIVE NEGATIVE Final    Comment: (NOTE) SARS-CoV-2 target nucleic acids are NOT DETECTED.  The SARS-CoV-2 RNA is generally detectable in upper respiratory specimens during the acute phase of infection. The lowest concentration of SARS-CoV-2 viral copies this assay can detect is 138 copies/mL. A negative result does not preclude SARS-Cov-2 infection and should not be used as the sole basis for treatment or other patient management decisions. A negative result may occur with  improper specimen collection/handling, submission of specimen other than nasopharyngeal swab, presence of viral mutation(s) within the areas targeted by this assay, and inadequate number of viral copies(<138 copies/mL). A negative result must be combined with clinical observations, patient history, and epidemiological information. The expected result is Negative.  Fact Sheet for Patients:  BloggerCourse.com  Fact Sheet for Healthcare Providers:  SeriousBroker.it  This test is no t yet approved or cleared by the [place] FDA and  has been authorized for detection and/or diagnosis of SARS-CoV-2 by FDA under an Emergency Use Authorization (EUA). This EUA will remain  in effect (meaning this test can be used) for the duration of the COVID-19 declaration under Section 564(b)(1) of the Act, 21 U.S.C.section 360bbb-3(b)(1), unless the authorization is terminated  or revoked sooner.       Influenza A by PCR NEGATIVE NEGATIVE Final   Influenza B by PCR NEGATIVE NEGATIVE Final    Comment: (NOTE) The Xpert Xpress SARS-CoV-2/FLU/RSV plus assay is intended as an aid in the diagnosis of influenza from Nasopharyngeal swab specimens and should not be used as a sole basis for treatment. Nasal washings and aspirates are unacceptable for Xpert Xpress SARS-CoV-2/FLU/RSV testing.  Fact Sheet for  Patients: BloggerCourse.com  Fact Sheet for Healthcare Providers: SeriousBroker.it  This test is not yet approved or cleared by the [place] FDA and has been authorized for detection and/or diagnosis of SARS-CoV-2 by FDA under an Emergency Use Authorization (EUA). This EUA will remain in effect (meaning this test can be used) for the duration of the COVID-19 declaration under Section 564(b)(1) of the Act, 21 U.S.C. section 360bbb-3(b)(1), unless the authorization is terminated or revoked.  Performed at [facility], [address], [place], [place] [address]          Radiology Studies: CT HEAD WO CONTRAST ( )  Result Date: 12/30/2021 CLINICAL DATA:  Mental status change of unknown cause. Insomnia, nausea, and vomiting. Confusion. EXAM: CT HEAD WITHOUT CONTRAST TECHNIQUE: Contiguous axial images were obtained from the base of the skull through the vertex without intravenous contrast. RADIATION DOSE REDUCTION: This exam was performed according to the departmental dose-optimization program which includes automated exposure control, adjustment of the mA and/or kV according to patient size and/or use of iterative reconstruction technique. COMPARISON:  11/05/2021 FINDINGS: Brain: No evidence of acute infarction, hemorrhage, hydrocephalus, extra-axial collection or mass lesion/mass effect. Vascular: Moderate intracranial arterial vascular calcifications are present. Skull: Calvarium appears intact. Sinuses/Orbits: Old appearing fracture deformity of the medial right orbital wall, unchanged. Paranasal sinuses and mastoid air cells are clear. Other: None. IMPRESSION: No acute  intracranial abnormalities. Electronically Signed   By: Burman Nieves M.D.   On: 12/30/2021 23:16        Scheduled Meds:  (feeding supplement) PROSource Plus  30 mL Oral BID BM   enoxaparin (LOVENOX) injection  40 mg Subcutaneous Q24H   feeding  supplement  237 mL Oral TID BM   furosemide  40 mg Oral Daily   insulin aspart  0-15 Units Subcutaneous TID WC   lactulose  30 g Oral TID   pantoprazole  40 mg Oral BID   rifaximin  550 mg Oral BID   spironolactone  100 mg Oral Daily   tamsulosin  0.4 mg Oral Daily   Continuous Infusions:   LOS: 1 day    Time spent: 30 mins    Charise Killian, MD [facility] Pager [phone]  If 7PM-7AM, please contact night-coverage  12/31/2021, 6:59 AM

## 2021-12-31 NOTE — H&P (Signed)
[place]   PATIENT NAME: Shawn Torres    MR#:  [id number]  DATE OF BIRTH:  Jun 14, 1966  DATE OF ADMISSION:  12/30/2021  PRIMARY CARE PHYSICIAN: Caryl Never, MD   Patient is coming from: Home  REQUESTING/REFERRING PHYSICIAN: Merlyn Lot, MD  CHIEF COMPLAINT:   Chief Complaint  Patient presents with   Emesis   Insomnia   The patient's daughter was providing Spanish translation. HISTORY OF PRESENT ILLNESS:  [name] is a 56 y.o. Hispanic male with medical history significant for alcoholic liver cirrhosis and ascites, type 2 diabetes mellitus, who presented to emergency room with acute onset of altered mental status with confusion.  He has been having nausea and vomiting.  Per his daughter he was fairly slow to react.  He has abdominal distention and mild tightness.  He admitted to mild cough without wheezing or dyspnea.  No chest pain or palpitations.  No fever or chills.  He was having tremors.  His last alcoholic drink was 9 months ago.    ED Course: When he came to the ER, BP was 144/78 with otherwise normal vital signs.  Labs revealed hyponatremia 128 with a blood glucose of 225 and alk phos 157 with albumin 3.  Ammonia level came back significantly elevated at 150 and total bili was 2.3.  CBC showed anemia with hemoglobin of 9.1 hematocrit 25.2 better than previous levels and thrombocytopenia of 125 compared to 102 on 12/19/2021.  INR is 1.8 and PT 20.9 with PTT of 39.  Influenza antigens and COVID-19 PCR came back negative.  Imaging: Noncontrast head CT scan revealed no acute intracranial normalities.  The patient was given 30 g of p.o. lactulose, 4 mg of IV Zofran and 500 mill IV normal saline bolus.  He will be admitted to a medical telemetry bed for further evaluation and management. PAST MEDICAL HISTORY:   Past Medical History:  Diagnosis Date   Ascites    Cirrhosis, alcoholic ([place])    Diabetes mellitus without complication ([place])    Memory loss      PAST SURGICAL HISTORY:   Past Surgical History:  Procedure Laterality Date   ESOPHAGOGASTRODUODENOSCOPY N/A 10/10/2021   Procedure: ESOPHAGOGASTRODUODENOSCOPY (EGD);  Surgeon: [name], MD;  Location: [facility] ENDOSCOPY;  Service: Endoscopy;  Laterality: N/A;   ESOPHAGOGASTRODUODENOSCOPY N/A 12/27/2021   Procedure: ESOPHAGOGASTRODUODENOSCOPY (EGD);  Surgeon: Annamaria Helling, DO;  Location: [facility] ENDOSCOPY;  Service: Gastroenterology;  Laterality: N/A;  Spanish Interpreter   ESOPHAGOGASTRODUODENOSCOPY (EGD) WITH PROPOFOL N/A 12/11/2021   Procedure: ESOPHAGOGASTRODUODENOSCOPY (EGD) WITH PROPOFOL;  Surgeon: Annamaria Helling, DO;  Location: [place];  Service: Gastroenterology;  Laterality: N/A;    SOCIAL HISTORY:   Social History   Tobacco Use   Smoking status: Never   Smokeless tobacco: Never  Substance Use Topics   Alcohol use: Not Currently    Comment: SINCE JUNE 2022    FAMILY HISTORY:   Family History  Family history unknown: Yes  No pertinent family history.  DRUG ALLERGIES:   Allergies  Allergen Reactions   Albumin Gc Rash    Patient gets rash with albumin. See media tab picture dated 12/24/21. Needs pre-meds w/ albumin (benadryl) to avoid allergic reaction.     REVIEW OF SYSTEMS:   ROS As per history of present illness. All pertinent systems were reviewed above. Constitutional, HEENT, cardiovascular, respiratory, GI, GU, musculoskeletal, neuro, psychiatric, endocrine, integumentary and hematologic systems were reviewed and are otherwise negative/unremarkable except for positive findings mentioned  above in the HPI.   MEDICATIONS AT HOME:   Prior to Admission medications   Medication Sig Start Date End Date Taking? Authorizing Provider  feeding supplement (ENSURE ENLIVE / ENSURE PLUS) LIQD Take 237 mLs by mouth 3 (three) times daily between meals. 11/03/21  Yes Nicole Kindred A, DO  folic acid (FOLVITE) 1 MG tablet Take 1 tablet by mouth daily.  09/03/21 09/03/22 Yes [provider]  furosemide (LASIX) 40 MG tablet Take 1 tablet (40 mg total) by mouth daily. 11/04/21  Yes Nicole Kindred A, DO  lactulose (CHRONULAC) 10 GM/15ML solution Take 20 g by mouth every 4 (four) hours as needed for moderate constipation or severe constipation.   Yes [provider]  Nutritional Supplements (,FEEDING SUPPLEMENT, PROSOURCE PLUS) liquid Take 30 mLs by mouth 2 (two) times daily between meals. 11/03/21  Yes Nicole Kindred A, DO  pantoprazole (PROTONIX) 40 MG tablet Take 1 tablet (40 mg total) by mouth 2 (two) times daily. 12/14/21  Yes Jennye Boroughs, MD  rifaximin (XIFAXAN) 550 MG TABS tablet Take 1 tablet (550 mg total) by mouth 2 (two) times daily. 12/14/21 01/13/22 Yes Jennye Boroughs, MD  spironolactone (ALDACTONE) 100 MG tablet Take 1 tablet (100 mg total) by mouth daily. 11/04/21  Yes Nicole Kindred A, DO  tamsulosin (FLOMAX) 0.4 MG CAPS capsule Take 1 capsule (0.4 mg total) by mouth daily. 10/17/21  Yes Richarda Osmond, MD  thiamine 100 MG tablet Take 1 tablet (100 mg total) by mouth daily. 10/16/21  Yes Richarda Osmond, MD      VITAL SIGNS:  Blood pressure (!) 141/86, pulse 88, temperature 98.8 F (37.1 C), temperature source Oral, resp. rate 17, SpO2 100 %.  PHYSICAL EXAMINATION:  Physical Exam  GENERAL:  56 y.o.-year-old Hispanic male patient lying in the bed with no acute distress.  He was mildly somnolent but easily arousable. EYES: Pupils equal, round, reactive to light and accommodation. No scleral icterus. Extraocular muscles intact.  HEENT: Head atraumatic, normocephalic. Oropharynx and nasopharynx clear.  NECK:  Supple, no jugular venous distention. No thyroid enlargement, no tenderness.  LUNGS: Normal breath sounds bilaterally, no wheezing, rales,rhonchi or crepitation. No use of accessory muscles of respiration.  CARDIOVASCULAR: Regular rate and rhythm, S1, S2 normal. No murmurs, rubs, or gallops.   ABDOMEN: Soft, distended with positive shifting dullness nontender. Bowel sounds present.  I could not palpate masses or organomegaly due to his ascites. EXTREMITIES: 1+ bilateral lower extremity pitting edema, with no cyanosis, or clubbing.  NEUROLOGIC: Cranial nerves II through XII are intact. Muscle strength 5/5 in all extremities. Sensation intact. Gait not checked.  PSYCHIATRIC: The patient is  mildly somnolent but easily arousable.  Normal affect and good eye contact. SKIN: No obvious rash, lesion, or ulcer.   LABORATORY PANEL:   CBC Recent Labs  Lab 12/30/21 2115  WBC 5.3  HGB 9.1*  HCT 25.2*  PLT 125*   ------------------------------------------------------------------------------------------------------------------  Chemistries  Recent Labs  Lab 12/30/21 2115  NA 128*  K 4.2  CL 106  CO2 16*  GLUCOSE 225*  BUN 18  CREATININE 1.16  CALCIUM 8.2*  AST 28  ALT 23  ALKPHOS 157*  BILITOT 2.3*   ------------------------------------------------------------------------------------------------------------------  Cardiac Enzymes No results for input(s): TROPONINI in the last 168 hours. ------------------------------------------------------------------------------------------------------------------  RADIOLOGY:  CT HEAD WO CONTRAST (5MM)  Result Date: 12/30/2021 CLINICAL DATA:  Mental status change of unknown cause. Insomnia, nausea, and vomiting. Confusion. EXAM: CT HEAD WITHOUT CONTRAST TECHNIQUE: Contiguous axial images were  obtained from the base of the skull through the vertex without intravenous contrast. RADIATION DOSE REDUCTION: This exam was performed according to the departmental dose-optimization program which includes automated exposure control, adjustment of the mA and/or kV according to patient size and/or use of iterative reconstruction technique. COMPARISON:  11/05/2021 FINDINGS: Brain: No evidence of acute infarction, hemorrhage, hydrocephalus, extra-axial  collection or mass lesion/mass effect. Vascular: Moderate intracranial arterial vascular calcifications are present. Skull: Calvarium appears intact. Sinuses/Orbits: Old appearing fracture deformity of the medial right orbital wall, unchanged. Paranasal sinuses and mastoid air cells are clear. Other: None. IMPRESSION: No acute intracranial abnormalities. Electronically Signed   By: Lucienne Capers M.D.   On: 12/30/2021 23:16      IMPRESSION AND PLAN:  Principal Problem:   Hepatic encephalopathy  1.  Acute hepatic cephalopathy. - The patient be admitted to a medical telemetry bed. - We will place him on p.o. lactulose and Xifaxan. - We will follow serial ammonia levels. - We will monitor his mental status.  2.  Alcohol liver cirrhosis with hepatic ascites and liver cell failure. - We will continue his Lasix and Aldactone.  3.  BPH. - We will continue Flomax.  4.  Hyponatremia likely hypervolemic with associated metabolic acidosis. - We will follow BMP with diuresis. - He will be placed on bicarbonate drip.  5.  Type II diabetes mellitus with hyperglycemia. - The patient will be placed on supplemental coverage with NovoLog.  6.  Hypertension. - We will continue his nadolol  DVT prophylaxis: SCDs.  Medical prophylaxis currently contraindicated due to thrombocytopenia and coagulopathy. Advanced Care Planning:  Code Status: full code. Family Communication:  The plan of care was discussed in details with the patient (and family). I answered all questions. The patient agreed to proceed with the above mentioned plan. Further management will depend upon hospital course. Disposition Plan: Back to previous home environment Consults called: none. All the records are reviewed and case discussed with ED provider.  Status is: Inpatient   At the time of the admission, it appears that the appropriate admission status for this patient is inpatient.  This is judged to be reasonable and  necessary in order to provide the required intensity of service to ensure the patient's safety given the presenting symptoms, physical exam findings and initial radiographic and laboratory data in the context of comorbid conditions.  The patient requires inpatient status due to high intensity of service, high risk of further deterioration and high frequency of surveillance required.  I certify that at the time of admission, it is my clinical judgment that the patient will require inpatient hospital care extending more than 2 midnights.                            Dispo: The patient is from: Home              Anticipated d/c is to: Home              Patient currently is not medically stable to d/c.              Difficult to place patient: No    Christel Mormon M.D on 12/31/2021 at 12:46 AM  [facility]   From 7 PM-7 AM, contact night-coverage [email or web address]  CC: Primary care physician; Caryl Never, MD

## 2021-12-31 NOTE — ED Notes (Signed)
Pt needed minimal assistance from this tech to bedside commode directly next to pt bed. After pt passed BM, pt was assisted again by this tech back to bed and placed on cardiac monitor. Pt has no other needs at this time. Will continue to monitor.

## 2021-12-31 NOTE — Procedures (Signed)
PROCEDURE SUMMARY:  Successful US guided paracentesis from right lateral abdomen.  Yielded 4 liters of clear yellow fluid.  No immediate complications.  Patient tolerated well.  EBL = trace  Delphia Kaylor S Farin Buhman PA-C 12/31/2021 5:00 PM

## 2022-01-01 DIAGNOSIS — E872 Acidosis, unspecified: Secondary | ICD-10-CM

## 2022-01-01 LAB — CBC
HCT: 28.4 % — ABNORMAL LOW (ref 39.0–52.0)
Hemoglobin: 9.9 g/dL — ABNORMAL LOW (ref 13.0–17.0)
MCH: 31.6 pg (ref 26.0–34.0)
MCHC: 34.9 g/dL (ref 30.0–36.0)
MCV: 90.7 fL (ref 80.0–100.0)
Platelets: 119 10*3/uL — ABNORMAL LOW (ref 150–400)
RBC: 3.13 MIL/uL — ABNORMAL LOW (ref 4.22–5.81)
RDW: 16.3 % — ABNORMAL HIGH (ref 11.5–15.5)
WBC: 6.5 10*3/uL (ref 4.0–10.5)
nRBC: 0 % (ref 0.0–0.2)

## 2022-01-01 LAB — COMPREHENSIVE METABOLIC PANEL
ALT: 24 U/L (ref 0–44)
AST: 35 U/L (ref 15–41)
Albumin: 2.7 g/dL — ABNORMAL LOW (ref 3.5–5.0)
Alkaline Phosphatase: 139 U/L — ABNORMAL HIGH (ref 38–126)
Anion gap: 9 (ref 5–15)
BUN: 22 mg/dL — ABNORMAL HIGH (ref 6–20)
CO2: 14 mmol/L — ABNORMAL LOW (ref 22–32)
Calcium: 8.1 mg/dL — ABNORMAL LOW (ref 8.9–10.3)
Chloride: 103 mmol/L (ref 98–111)
Creatinine, Ser: 1.54 mg/dL — ABNORMAL HIGH (ref 0.61–1.24)
GFR, Estimated: 54 mL/min — ABNORMAL LOW (ref >60)
Glucose, Bld: 144 mg/dL — ABNORMAL HIGH (ref 70–99)
Potassium: 4.6 mmol/L (ref 3.5–5.1)
Sodium: 126 mmol/L — ABNORMAL LOW (ref 135–145)
Total Bilirubin: 1.7 mg/dL — ABNORMAL HIGH (ref 0.3–1.2)
Total Protein: 6.4 g/dL — ABNORMAL LOW (ref 6.5–8.1)

## 2022-01-01 LAB — GLUCOSE, CAPILLARY
Glucose-Capillary: 174 mg/dL — ABNORMAL HIGH (ref 70–99)
Glucose-Capillary: 223 mg/dL — ABNORMAL HIGH (ref 70–99)
Glucose-Capillary: 216 mg/dL — ABNORMAL HIGH (ref 70–99)
Glucose-Capillary: 209 mg/dL — ABNORMAL HIGH (ref 70–99)

## 2022-01-01 LAB — AMMONIA: Ammonia: 31 umol/L (ref 9–35)

## 2022-01-01 MED ORDER — SODIUM BICARBONATE 650 MG PO TABS
650.0000 mg | ORAL_TABLET | Freq: Two times a day (BID) | ORAL | Status: DC
  Administered 2022-01-01 – 2022-01-03 (×5): 650 mg via ORAL
  Filled 2022-01-01 (×5): qty 1

## 2022-01-01 MED ORDER — SODIUM CHLORIDE 0.9 % IV SOLN
1.0000 g | INTRAVENOUS | Status: DC
  Administered 2022-01-01 – 2022-01-03 (×3): 1 g via INTRAVENOUS
  Filled 2022-01-01 (×3): qty 1
  Filled 2022-01-01: qty 10

## 2022-01-01 NOTE — Progress Notes (Signed)
PROGRESS NOTE    Shawn Torres  MWU:[id number] DOB: 1966-01-02 DOA: 12/30/2021 PCP: Kurtis Bushman, MD   Assessment & Plan:   Principal Problem:   Hepatic encephalopathy  Acute hepatic encephalopathy: likely secondary to cirrhosis. Continue on rifaximin, lactulose. Ammonia is WNL today. Mental status is close to baseline   Alcohol liver cirrhosis: w/ ascites. S/p paracentesis on 12/31/21 w/ 4L removed. Started on IV rocephin for SBP prophylaxis. Continue on aldactone, lasix. Evidently quit drinking alcohol approx 9 months ago   Thrombocytopenia: likely secondary to cirrhosis   Normocytic anemia: likely secondary to cirrhosis   Hyperbilirubinemia: trending down. Likely secondary to cirrhosis  BPH: continue on flomax   Hyponatremia: likely hypervolemic from cirrhosis. Labile.   Metabolic acidosis: likely secondary to liver cirrhosis w/ ascites. Started on sodium bicarb tabs   DM2: likely poorly controlled. Continue on SSI w/ accuchecks    DVT prophylaxis: SCDs Code Status: full  Family Communication: no family at bedside  Disposition Plan: likely d/c back home   Level of care: Telemetry Medical  Status is: Inpatient Remains inpatient appropriate because: severity of illness      Consultants:    Procedures:   Antimicrobials: rocephin    Subjective: Pt c/o fatigue   Objective: Vitals:   12/31/21 2153 01/01/22 0000 01/01/22 0300 01/01/22 0441  BP: 130/81 118/72 121/76 119/70  Pulse: (!) 103 98 91 98  Resp: 20 20 19 16   Temp: (!) 102.3 F (39.1 C) (!) 101.5 F (38.6 C) (!) 101.9 F (38.8 C) (!) 100.6 F (38.1 C)  TempSrc: Oral Oral Oral Oral  SpO2: 100% 100% 100% 100%  Weight:      Height:        Intake/Output Summary (Last 24 hours) at 01/01/2022 0733 Last data filed at 01/01/2022 0000 Gross per 24 hour  Intake 200 ml  Output 1000 ml  Net -800 ml   Filed Weights   12/31/21 1248 12/31/21 1500  Weight: 79.4 kg 79.4 kg     Examination:  General exam: appears calm but uncomfortable  Respiratory system: clear breath sounds b/l  Cardiovascular system: S1/S2+. No rubs or gallops Gastrointestinal system: Abd is soft, distended, NT & hypoactive bowel sounds  Central nervous system: alert and awake. Moves all extremities   Psychiatry: judgement and insight appears close to baseline. Flat mood and affect     Data Reviewed: I have personally reviewed following labs and imaging studies  CBC: Recent Labs  Lab 12/30/21 2115 12/31/21 0620 01/01/22 0604  WBC 5.3 4.6 6.5  NEUTROABS 3.5  --   --   HGB 9.1* 8.2* 9.9*  HCT 25.2* 23.4* 28.4*  MCV 94.7 93.2 90.7  PLT 125* 120* 119*   Basic Metabolic Panel: Recent Labs  Lab 12/30/21 2115 12/31/21 0620 01/01/22 0604  NA 128* 130* 126*  K 4.2 4.0 4.6  CL 106 110 103  CO2 16* 16* 14*  GLUCOSE 225* 136* 144*  BUN 18 18 22*  CREATININE 1.16 1.01 1.54*  CALCIUM 8.2* 7.9* 8.1*   GFR: Estimated Creatinine Clearance: 56 mL/min (A) (by C-G formula based on SCr of 1.54 mg/dL (H)). Liver Function Tests: Recent Labs  Lab 12/30/21 2115 12/31/21 0620 01/01/22 0604  AST 28 27 35  ALT 23 20 24   ALKPHOS 157* 123 139*  BILITOT 2.3* 1.8* 1.7*  PROT 6.7 5.6* 6.4*  ALBUMIN 3.0* 2.6* 2.7*   Recent Labs  Lab 12/30/21 2115  LIPASE 44   Recent Labs  Lab 12/30/21 2115  12/31/21 0620 01/01/22 0604  AMMONIA 150* 59* 31   Coagulation Profile: Recent Labs  Lab 12/30/21 2115  INR 1.8*   Cardiac Enzymes: No results for input(s): CKTOTAL, CKMB, CKMBINDEX, TROPONINI in the last 168 hours. BNP (last 3 results) No results for input(s): PROBNP in the last 8760 hours. HbA1C: Recent Labs    12/31/21 0620  HGBA1C 5.1   CBG: Recent Labs  Lab 12/31/21 0740 12/31/21 1137 12/31/21 1250 12/31/21 1607  GLUCAP 120* 162* 184* 173*   Lipid Profile: No results for input(s): CHOL, HDL, LDLCALC, TRIG, CHOLHDL, LDLDIRECT in the last 72 hours. Thyroid Function  Tests: No results for input(s): TSH, T4TOTAL, FREET4, T3FREE, THYROIDAB in the last 72 hours. Anemia Panel: No results for input(s): VITAMINB12, FOLATE, FERRITIN, TIBC, IRON, RETICCTPCT in the last 72 hours. Sepsis Labs: No results for input(s): PROCALCITON, LATICACIDVEN in the last 168 hours.  Recent Results (from the past 240 hour(s))  Resp Panel by RT-PCR (Flu A&B, Covid) Nasopharyngeal Swab     Status: None   Collection Time: 12/30/21  9:15 PM   Specimen: Nasopharyngeal Swab; Nasopharyngeal(NP) swabs in vial transport medium  Result Value Ref Range Status   SARS Coronavirus 2 by RT PCR NEGATIVE NEGATIVE Final    Comment: (NOTE) SARS-CoV-2 target nucleic acids are NOT DETECTED.  The SARS-CoV-2 RNA is generally detectable in upper respiratory specimens during the acute phase of infection. The lowest concentration of SARS-CoV-2 viral copies this assay can detect is 138 copies/mL. A negative result does not preclude SARS-Cov-2 infection and should not be used as the sole basis for treatment or other patient management decisions. A negative result may occur with  improper specimen collection/handling, submission of specimen other than nasopharyngeal swab, presence of viral mutation(s) within the areas targeted by this assay, and inadequate number of viral copies(<138 copies/mL). A negative result must be combined with clinical observations, patient history, and epidemiological information. The expected result is Negative.  Fact Sheet for Patients:  BloggerCourse.com  Fact Sheet for Healthcare Providers:  SeriousBroker.it  This test is no t yet approved or cleared by the [place] FDA and  has been authorized for detection and/or diagnosis of SARS-CoV-2 by FDA under an Emergency Use Authorization (EUA). This EUA will remain  in effect (meaning this test can be used) for the duration of the COVID-19 declaration under Section  564(b)(1) of the Act, 21 U.S.C.section 360bbb-3(b)(1), unless the authorization is terminated  or revoked sooner.       Influenza A by PCR NEGATIVE NEGATIVE Final   Influenza B by PCR NEGATIVE NEGATIVE Final    Comment: (NOTE) The Xpert Xpress SARS-CoV-2/FLU/RSV plus assay is intended as an aid in the diagnosis of influenza from Nasopharyngeal swab specimens and should not be used as a sole basis for treatment. Nasal washings and aspirates are unacceptable for Xpert Xpress SARS-CoV-2/FLU/RSV testing.  Fact Sheet for Patients: BloggerCourse.com  Fact Sheet for Healthcare Providers: SeriousBroker.it  This test is not yet approved or cleared by the [place] FDA and has been authorized for detection and/or diagnosis of SARS-CoV-2 by FDA under an Emergency Use Authorization (EUA). This EUA will remain in effect (meaning this test can be used) for the duration of the COVID-19 declaration under Section 564(b)(1) of the Act, 21 U.S.C. section 360bbb-3(b)(1), unless the authorization is terminated or revoked.  Performed at [facility], [address] [place], [place] [address]          Radiology Studies: CT HEAD WO CONTRAST ( )  Result Date: 12/30/2021 CLINICAL DATA:  Mental status change of unknown cause. Insomnia, nausea, and vomiting. Confusion. EXAM: CT HEAD WITHOUT CONTRAST TECHNIQUE: Contiguous axial images were obtained from the base of the skull through the vertex without intravenous contrast. RADIATION DOSE REDUCTION: This exam was performed according to the departmental dose-optimization program which includes automated exposure control, adjustment of the mA and/or kV according to patient size and/or use of iterative reconstruction technique. COMPARISON:  11/05/2021 FINDINGS: Brain: No evidence of acute infarction, hemorrhage, hydrocephalus, extra-axial collection or mass lesion/mass effect. Vascular:  Moderate intracranial arterial vascular calcifications are present. Skull: Calvarium appears intact. Sinuses/Orbits: Old appearing fracture deformity of the medial right orbital wall, unchanged. Paranasal sinuses and mastoid air cells are clear. Other: None. IMPRESSION: No acute intracranial abnormalities. Electronically Signed   By: Burman Nieves M.D.   On: 12/30/2021 23:16   US Paracentesis  Result Date: 12/31/2021 INDICATION: Alcoholic cirrhosis with recurrent ascites. Request for therapeutic paracentesis up to 4 L. EXAM: ULTRASOUND GUIDED PARACENTESIS MEDICATIONS: 1% lidocaine 10 mL COMPLICATIONS: None immediate. PROCEDURE: Informed written consent was obtained from the patient after a discussion of the risks, benefits and alternatives to treatment. A timeout was performed prior to the initiation of the procedure. Initial ultrasound scanning demonstrates a large amount of ascites within the right lateral abdomen. The right lateral abdomen was prepped and draped in the usual sterile fashion. 1% lidocaine was used for local anesthesia. Following this, a 19 gauge, 7-cm, Yueh catheter was introduced. An ultrasound image was saved for documentation purposes. The paracentesis was performed. The catheter was removed and a dressing was applied. The patient tolerated the procedure well without immediate post procedural complication. FINDINGS: A total of approximately 4 L of clear yellow fluid was removed. IMPRESSION: Successful ultrasound-guided paracentesis yielding 4 liters of peritoneal fluid. Procedure performed by Corrin Parker, PA-C Electronically Signed   By: Olive Bass M.D.   On: 12/31/2021 17:01        Scheduled Meds:  (feeding supplement) PROSource Plus  30 mL Oral BID BM   enoxaparin (LOVENOX) injection  40 mg Subcutaneous Q24H   feeding supplement  237 mL Oral TID BM   furosemide  40 mg Oral Daily   insulin aspart  0-15 Units Subcutaneous TID WC   lactulose  30 g Oral TID   pantoprazole   40 mg Oral BID   pneumococcal 23 valent vaccine  0.5 mL Intramuscular Tomorrow-1000   rifaximin  550 mg Oral BID   spironolactone  100 mg Oral Daily   tamsulosin  0.4 mg Oral Daily   Continuous Infusions:   LOS: 2 days    Time spent: 25 mins    Charise Killian, MD [facility] Pager [phone]  If 7PM-7AM, please contact night-coverage  01/01/2022, 7:33 AM

## 2022-01-01 NOTE — Plan of Care (Signed)

## 2022-01-02 DIAGNOSIS — R509 Fever, unspecified: Secondary | ICD-10-CM

## 2022-01-02 LAB — COMPREHENSIVE METABOLIC PANEL
ALT: 20 U/L (ref 0–44)
AST: 27 U/L (ref 15–41)
Albumin: 2.3 g/dL — ABNORMAL LOW (ref 3.5–5.0)
Alkaline Phosphatase: 112 U/L (ref 38–126)
Anion gap: 5 (ref 5–15)
BUN: 33 mg/dL — ABNORMAL HIGH (ref 6–20)
CO2: 18 mmol/L — ABNORMAL LOW (ref 22–32)
Calcium: 7.7 mg/dL — ABNORMAL LOW (ref 8.9–10.3)
Chloride: 103 mmol/L (ref 98–111)
Creatinine, Ser: 1.74 mg/dL — ABNORMAL HIGH (ref 0.61–1.24)
GFR, Estimated: 46 mL/min — ABNORMAL LOW (ref >60)
Glucose, Bld: 181 mg/dL — ABNORMAL HIGH (ref 70–99)
Potassium: 4.4 mmol/L (ref 3.5–5.1)
Sodium: 126 mmol/L — ABNORMAL LOW (ref 135–145)
Total Bilirubin: 1.1 mg/dL (ref 0.3–1.2)
Total Protein: 5.3 g/dL — ABNORMAL LOW (ref 6.5–8.1)

## 2022-01-02 LAB — CBC
HCT: 22.6 % — ABNORMAL LOW (ref 39.0–52.0)
Hemoglobin: 8.3 g/dL — ABNORMAL LOW (ref 13.0–17.0)
MCH: 34.4 pg — ABNORMAL HIGH (ref 26.0–34.0)
MCHC: 36.7 g/dL — ABNORMAL HIGH (ref 30.0–36.0)
MCV: 93.8 fL (ref 80.0–100.0)
Platelets: 89 10*3/uL — ABNORMAL LOW (ref 150–400)
RBC: 2.41 MIL/uL — ABNORMAL LOW (ref 4.22–5.81)
RDW: 16.2 % — ABNORMAL HIGH (ref 11.5–15.5)
WBC: 5.8 10*3/uL (ref 4.0–10.5)
nRBC: 0 % (ref 0.0–0.2)

## 2022-01-02 LAB — GLUCOSE, CAPILLARY
Glucose-Capillary: 177 mg/dL — ABNORMAL HIGH (ref 70–99)
Glucose-Capillary: 191 mg/dL — ABNORMAL HIGH (ref 70–99)
Glucose-Capillary: 188 mg/dL — ABNORMAL HIGH (ref 70–99)
Glucose-Capillary: 191 mg/dL — ABNORMAL HIGH (ref 70–99)

## 2022-01-02 LAB — AMMONIA: Ammonia: 71 umol/L — ABNORMAL HIGH (ref 9–35)

## 2022-01-02 NOTE — Progress Notes (Signed)
PROGRESS NOTE    Shawn Torres  [id number] DOB: [birth year] DOA: 12/30/2021 PCP: Caryl Never, MD   Assessment & Plan:   Principal Problem:   Hepatic encephalopathy  Acute hepatic encephalopathy: likely secondary to cirrhosis. Continue on rifaximin, lactulose. Ammonia level is labile. Mental status is close to baseline  Alcohol liver cirrhosis: w/ ascites. S/p paracentesis on 12/31/21 w/ 4L removed. Continue on IV rocephin for SBP prophylaxis. Continue on lasix, aldactone. Evidently quit drinking alcohol approx 9 months ago   Fevers: of unknown etiology. Tylenol prn. Blood cxs NGTD   Thrombocytopenia: labile. Likely secondary to cirrhosis   Normocytic anemia: likely secondary to cirrhosis. H&H are labile   Hyperbilirubinemia: resolved   BPH: continue on flomax   Hyponatremia: likely hypervolemic secondary to cirrhosis. Labile   Metabolic acidosis: likely secondary to liver cirrhosis w/ ascites. Continue on sodium bicarb tabs   DM2: likely poorly controlled. Continue on SSI w/ accuchecks    DVT prophylaxis: SCDs Code Status: full  Family Communication: discussed pt's care w/ pt's daughter, [name], and answered her questions  Disposition Plan: likely d/c back home   Level of care: Telemetry Medical  Status is: Inpatient Remains inpatient appropriate because: severity of illness, still spiking fevers.       Consultants:    Procedures:   Antimicrobials: rocephin    Subjective: Pt c/o malaise   Objective: Vitals:   01/01/22 0900 01/01/22 1417 01/01/22 2105 01/02/22 0336  BP: 130/72 106/70 105/61 95/68  Pulse:  80 80 79  Resp:  18 16 16   Temp: 99.9 F (37.7 C) 99.3 F (37.4 C) 98.4 F (36.9 C) 99 F (37.2 C)  TempSrc:   Oral   SpO2: 100% 100% 100% 100%  Weight:      Height:        Intake/Output Summary (Last 24 hours) at 01/02/2022 0727 Last data filed at 01/01/2022 2100 Gross per 24 hour  Intake 880 ml  Output --  Net 880 ml    Filed Weights   12/31/21 1248 12/31/21 1500  Weight: 79.4 kg 79.4 kg    Examination:  General exam: appears comfortable  Respiratory system: clear breath sounds b/l. No wheezes Cardiovascular system: S1 & S2+. No rubs or gallops Gastrointestinal system: abd is soft, distended, NT & normal bowel sounds  Central nervous system: alert and awake. Moves all extremities    Psychiatry: judgement and insight appears close to baseline. Flat mood and affect    Data Reviewed: I have personally reviewed following labs and imaging studies  CBC: Recent Labs  Lab 12/30/21 2115 12/31/21 0620 01/01/22 0604 01/02/22 0327  WBC 5.3 4.6 6.5 5.8  NEUTROABS 3.5  --   --   --   HGB 9.1* 8.2* 9.9* 8.3*  HCT 25.2* 23.4* 28.4* 22.6*  MCV 94.7 93.2 90.7 93.8  PLT 125* 120* 119* 89*   Basic Metabolic Panel: Recent Labs  Lab 12/30/21 2115 12/31/21 0620 01/01/22 0604 01/02/22 0327  NA 128* 130* 126* 126*  K 4.2 4.0 4.6 4.4  CL 106 110 103 103  CO2 16* 16* 14* 18*  GLUCOSE 225* 136* 144* 181*  BUN 18 18 22* 33*  CREATININE 1.16 1.01 1.54* 1.74*  CALCIUM 8.2* 7.9* 8.1* 7.7*   GFR: Estimated Creatinine Clearance: 49.6 mL/min (A) (by C-G formula based on SCr of 1.74 mg/dL (H)). Liver Function Tests: Recent Labs  Lab 12/30/21 2115 12/31/21 0620 01/01/22 0604 01/02/22 0327  AST 28 27 35 27  ALT 23 20  24 20  ALKPHOS 157* 123 139* 112  BILITOT 2.3* 1.8* 1.7* 1.1  PROT 6.7 5.6* 6.4* 5.3*  ALBUMIN 3.0* 2.6* 2.7* 2.3*   Recent Labs  Lab 12/30/21 2115  LIPASE 44   Recent Labs  Lab 12/30/21 2115 12/31/21 0620 01/01/22 0604 01/02/22 0327  AMMONIA 150* 59* 31 71*   Coagulation Profile: Recent Labs  Lab 12/30/21 2115  INR 1.8*   Cardiac Enzymes: No results for input(s): CKTOTAL, CKMB, CKMBINDEX, TROPONINI in the last 168 hours. BNP (last 3 results) No results for input(s): PROBNP in the last 8760 hours. HbA1C: Recent Labs    12/31/21 0620  HGBA1C 5.1   CBG: Recent  Labs  Lab 12/31/21 1607 01/01/22 1048 01/01/22 1202 01/01/22 1601 01/01/22 2110  GLUCAP 173* 174* 223* 216* 209*   Lipid Profile: No results for input(s): CHOL, HDL, LDLCALC, TRIG, CHOLHDL, LDLDIRECT in the last 72 hours. Thyroid Function Tests: No results for input(s): TSH, T4TOTAL, FREET4, T3FREE, THYROIDAB in the last 72 hours. Anemia Panel: No results for input(s): VITAMINB12, FOLATE, FERRITIN, TIBC, IRON, RETICCTPCT in the last 72 hours. Sepsis Labs: No results for input(s): PROCALCITON, LATICACIDVEN in the last 168 hours.  Recent Results (from the past 240 hour(s))  Resp Panel by RT-PCR (Flu A&B, Covid) Nasopharyngeal Swab     Status: None   Collection Time: 12/30/21  9:15 PM   Specimen: Nasopharyngeal Swab; Nasopharyngeal(NP) swabs in vial transport medium  Result Value Ref Range Status   SARS Coronavirus 2 by RT PCR NEGATIVE NEGATIVE Final    Comment: (NOTE) SARS-CoV-2 target nucleic acids are NOT DETECTED.  The SARS-CoV-2 RNA is generally detectable in upper respiratory specimens during the acute phase of infection. The lowest concentration of SARS-CoV-2 viral copies this assay can detect is 138 copies/mL. A negative result does not preclude SARS-Cov-2 infection and should not be used as the sole basis for treatment or other patient management decisions. A negative result may occur with  improper specimen collection/handling, submission of specimen other than nasopharyngeal swab, presence of viral mutation(s) within the areas targeted by this assay, and inadequate number of viral copies(<138 copies/mL). A negative result must be combined with clinical observations, patient history, and epidemiological information. The expected result is Negative.  Fact Sheet for Patients:  EntrepreneurPulse.com.au  Fact Sheet for Healthcare Providers:  IncredibleEmployment.be  This test is no t yet approved or cleared by the [place] FDA  and  has been authorized for detection and/or diagnosis of SARS-CoV-2 by FDA under an Emergency Use Authorization (EUA). This EUA will remain  in effect (meaning this test can be used) for the duration of the COVID-19 declaration under Section 564(b)(1) of the Act, 21 U.S.C.section 360bbb-3(b)(1), unless the authorization is terminated  or revoked sooner.       Influenza A by PCR NEGATIVE NEGATIVE Final   Influenza B by PCR NEGATIVE NEGATIVE Final    Comment: (NOTE) The Xpert Xpress SARS-CoV-2/FLU/RSV plus assay is intended as an aid in the diagnosis of influenza from Nasopharyngeal swab specimens and should not be used as a sole basis for treatment. Nasal washings and aspirates are unacceptable for Xpert Xpress SARS-CoV-2/FLU/RSV testing.  Fact Sheet for Patients: EntrepreneurPulse.com.au  Fact Sheet for Healthcare Providers: IncredibleEmployment.be  This test is not yet approved or cleared by the [place] FDA and has been authorized for detection and/or diagnosis of SARS-CoV-2 by FDA under an Emergency Use Authorization (EUA). This EUA will remain in effect (meaning this test can be used) for the  duration of the COVID-19 declaration under Section 564(b)(1) of the Act, 21 U.S.C. section 360bbb-3(b)(1), unless the authorization is terminated or revoked.  Performed at [facility], [address] [place], [place] [address]   CULTURE, BLOOD (ROUTINE X 2) w Reflex to ID Panel     Status: None (Preliminary result)   Collection Time: 01/01/22  9:10 AM   Specimen: BLOOD  Result Value Ref Range Status   Specimen Description BLOOD BLOOD RIGHT HAND  Final   Special Requests   Final    BOTTLES DRAWN AEROBIC AND ANAEROBIC Blood Culture adequate volume   Culture   Final    NO GROWTH < 24 HOURS Performed at [facility], [address], [place], [place] [address]    Report Status PENDING  Incomplete  CULTURE, BLOOD  (ROUTINE X 2) w Reflex to ID Panel     Status: None (Preliminary result)   Collection Time: 01/01/22  9:11 AM   Specimen: BLOOD  Result Value Ref Range Status   Specimen Description BLOOD LEFT ANTECUBITAL  Final   Special Requests   Final    BOTTLES DRAWN AEROBIC AND ANAEROBIC Blood Culture adequate volume   Culture   Final    NO GROWTH < 24 HOURS Performed at [facility], [address], [place], [place] [address]    Report Status PENDING  Incomplete         Radiology Studies: US Paracentesis  Result Date: 12/31/2021 INDICATION: Alcoholic cirrhosis with recurrent ascites. Request for therapeutic paracentesis up to 4 L. EXAM: ULTRASOUND GUIDED PARACENTESIS MEDICATIONS: 1% lidocaine 10 mL COMPLICATIONS: None immediate. PROCEDURE: Informed written consent was obtained from the patient after a discussion of the risks, benefits and alternatives to treatment. A timeout was performed prior to the initiation of the procedure. Initial ultrasound scanning demonstrates a large amount of ascites within the right lateral abdomen. The right lateral abdomen was prepped and draped in the usual sterile fashion. 1% lidocaine was used for local anesthesia. Following this, a 19 gauge, 7-cm, Yueh catheter was introduced. An ultrasound image was saved for documentation purposes. The paracentesis was performed. The catheter was removed and a dressing was applied. The patient tolerated the procedure well without immediate post procedural complication. FINDINGS: A total of approximately 4 L of clear yellow fluid was removed. IMPRESSION: Successful ultrasound-guided paracentesis yielding 4 liters of peritoneal fluid. Procedure performed by Gareth Eagle, PA-C Electronically Signed   By: Albin Felling M.D.   On: 12/31/2021 17:01        Scheduled Meds:  (feeding supplement) PROSource Plus  30 mL Oral BID BM   enoxaparin (LOVENOX) injection  40 mg Subcutaneous Q24H   feeding supplement  237 mL Oral TID  BM   furosemide  40 mg Oral Daily   insulin aspart  0-15 Units Subcutaneous TID WC   lactulose  30 g Oral TID   pantoprazole  40 mg Oral BID   pneumococcal 23 valent vaccine  0.5 mL Intramuscular Tomorrow-1000   rifaximin  550 mg Oral BID   sodium bicarbonate  650 mg Oral BID   spironolactone  100 mg Oral Daily   tamsulosin  0.4 mg Oral Daily   Continuous Infusions:  cefTRIAXone (ROCEPHIN)  IV 1 g (01/01/22 1041)     LOS: 3 days    Time spent: 15 mins    Wyvonnia Dusky, MD [facility] Pager [phone]  If 7PM-7AM, please contact night-coverage  01/02/2022, 7:27 AM

## 2022-01-03 DIAGNOSIS — D696 Thrombocytopenia, unspecified: Secondary | ICD-10-CM

## 2022-01-03 LAB — CBC
HCT: 24.6 % — ABNORMAL LOW (ref 39.0–52.0)
Hemoglobin: 8.6 g/dL — ABNORMAL LOW (ref 13.0–17.0)
MCH: 31.6 pg (ref 26.0–34.0)
MCHC: 35 g/dL (ref 30.0–36.0)
MCV: 90.4 fL (ref 80.0–100.0)
Platelets: 95 10*3/uL — ABNORMAL LOW (ref 150–400)
RBC: 2.72 MIL/uL — ABNORMAL LOW (ref 4.22–5.81)
RDW: 16.5 % — ABNORMAL HIGH (ref 11.5–15.5)
WBC: 5.2 10*3/uL (ref 4.0–10.5)
nRBC: 0 % (ref 0.0–0.2)

## 2022-01-03 LAB — AMMONIA: Ammonia: 67 umol/L — ABNORMAL HIGH (ref 9–35)

## 2022-01-03 LAB — COMPREHENSIVE METABOLIC PANEL
ALT: 21 U/L (ref 0–44)
AST: 35 U/L (ref 15–41)
Albumin: 2.2 g/dL — ABNORMAL LOW (ref 3.5–5.0)
Alkaline Phosphatase: 121 U/L (ref 38–126)
Anion gap: 7 (ref 5–15)
BUN: 35 mg/dL — ABNORMAL HIGH (ref 6–20)
CO2: 15 mmol/L — ABNORMAL LOW (ref 22–32)
Calcium: 7.8 mg/dL — ABNORMAL LOW (ref 8.9–10.3)
Chloride: 101 mmol/L (ref 98–111)
Creatinine, Ser: 1.6 mg/dL — ABNORMAL HIGH (ref 0.61–1.24)
GFR, Estimated: 51 mL/min — ABNORMAL LOW (ref >60)
Glucose, Bld: 152 mg/dL — ABNORMAL HIGH (ref 70–99)
Potassium: 4.4 mmol/L (ref 3.5–5.1)
Sodium: 123 mmol/L — ABNORMAL LOW (ref 135–145)
Total Bilirubin: 1.3 mg/dL — ABNORMAL HIGH (ref 0.3–1.2)
Total Protein: 5.5 g/dL — ABNORMAL LOW (ref 6.5–8.1)

## 2022-01-03 LAB — GLUCOSE, CAPILLARY
Glucose-Capillary: 140 mg/dL — ABNORMAL HIGH (ref 70–99)
Glucose-Capillary: 162 mg/dL — ABNORMAL HIGH (ref 70–99)

## 2022-01-03 MED ORDER — SODIUM BICARBONATE 650 MG PO TABS: 650.0000 mg | ORAL_TABLET | Freq: Two times a day (BID) | ORAL | 0 refills | Status: AC

## 2022-01-03 MED ORDER — PNEUMOCOCCAL VAC POLYVALENT 25 MCG/0.5ML IJ INJ
0.5000 mL | INJECTION | Freq: Once | INTRAMUSCULAR | Status: AC
  Administered 2022-01-03: 0.5 mL via INTRAMUSCULAR
  Filled 2022-01-03: qty 0.5

## 2022-01-03 NOTE — Progress Notes (Signed)
OT Cancellation Note  Patient Details Name: Shawn Torres MRN: [id number] DOB: 05-20-1966   Cancelled Treatment:    Reason Eval/Treat Not Completed: OT screened, no needs identified, will sign off.  PT found pt to be ambulating in room without assistance and making bed in room. No skilled need of OT intervention. OT to SIGN OFF at this time.   Darleen Crocker, MS, OTR/L , CBIS ascom [phone]  01/03/22, 9:05 AM

## 2022-01-03 NOTE — Discharge Summary (Signed)
Physician Discharge Summary  Shawn Torres YDX:[id number] DOB: 1966/07/19 DOA: 12/30/2021  PCP: Caryl Never, MD  Admit date: 12/30/2021 Discharge date: 01/03/2022  Admitted From: home  Disposition:  home  Recommendations for Outpatient Follow-up:  Follow up with PCP w/in 1 week F/u w/ GI w/in 1 week   Home Health: no  Equipment/Devices:  Discharge Condition: stable  CODE STATUS: full  Diet recommendation: Heart Healthy  Brief/Interim Summary: Shawn Torres is a 56 y.o. Hispanic male with medical history significant for alcoholic liver cirrhosis and ascites, type 2 diabetes mellitus, who presented to emergency room with acute onset of altered mental status with confusion.  He has been having nausea and vomiting.  Per his daughter he was fairly slow to react.  He has abdominal distention and mild tightness.  He admitted to mild cough without wheezing or dyspnea.  No chest pain or palpitations.  No fever or chills.  He was having tremors.  His last alcoholic drink was 9 months ago.    ED Course: When he came to the ER, BP was 144/78 with otherwise normal vital signs.  Labs revealed hyponatremia 128 with a blood glucose of 225 and alk phos 157 with albumin 3.  Ammonia level came back significantly elevated at 150 and total bili was 2.3.  CBC showed anemia with hemoglobin of 9.1 hematocrit 25.2 better than previous levels and thrombocytopenia of 125 compared to 102 on 12/19/2021.  INR is 1.8 and PT 20.9 with PTT of 39.  Influenza antigens and COVID-19 PCR came back negative.   Imaging: Noncontrast head CT scan revealed no acute intracranial normalities.  The patient was given 30 g of p.o. lactulose, 4 mg of IV Zofran and 500 mill IV normal saline bolus.  He will be admitted to a medical telemetry bed for further evaluation and management.   Hospital course as per Dr. Jimmye Norman: Pt presented w/ AMS likely secondary to hepatic encephalopathy. Pt was treated w/ rifaximin, lactulose. Pt's  mental status was back to baseline prior to d/c. Pt also had therapeutic paracentesis w/ approx 4L of peritoneal fluid removed as pt was scheduled to have a paracentesis done at [facility] while the pt was admitted here. Of note, did spike fevers while inpatient and was placed on rocephin for SBP prophylaxis. Blood cxs were NGTD. Pt was afebrile for 24 hrs prior to d/c.  Discharge Diagnoses:  Principal Problem:   Hepatic encephalopathy  Acute hepatic encephalopathy: likely secondary to cirrhosis. Continue on rifaximin, lactulose. Ammonia level is labile. Mental status is close to baseline  Alcohol liver cirrhosis: w/ ascites. S/p paracentesis on 12/31/21 w/ 4L removed. Continue on IV rocephin for SBP prophylaxis. Continue on lasix, aldactone. Evidently quit drinking alcohol approx 9 months ago   Fevers: of unknown etiology. Afebrile x 24 hrs. Tylenol prn. Blood cxs NGTD   Thrombocytopenia: labile. Likely secondary to cirrhosis   Normocytic anemia: likely secondary to cirrhosis. H&H are labile   Hyperbilirubinemia: resolved   BPH: continue on flomax   Hyponatremia: likely hypervolemic secondary to cirrhosis. Labile   Metabolic acidosis: likely secondary to liver cirrhosis w/ ascites. Continue on sodium bicarb tabs at d/c for 7 days and will need f/u w/ GI and PCP  DM2: likely poorly controlled. Continue on SSI w/ accuchecks    Discharge Instructions  Discharge Instructions     Diet - low sodium heart healthy   Complete by: As directed    Discharge instructions   Complete by: As directed  F/u w/ PCP within 1 week. F/u w/ GI within 1 week   Increase activity slowly   Complete by: As directed       Allergies as of 01/03/2022       Reactions   Albumin Gc Rash   Patient gets rash with albumin. See media tab picture dated 12/24/21. Needs pre-meds w/ albumin (benadryl) to avoid allergic reaction.         Medication List     TAKE these medications    (feeding supplement)  PROSource Plus liquid Take 30 mLs by mouth 2 (two) times daily between meals.   feeding supplement Liqd Take 237 mLs by mouth 3 (three) times daily between meals.   folic acid 1 MG tablet Commonly known as: FOLVITE Take 1 tablet by mouth daily.   furosemide 40 MG tablet Commonly known as: LASIX Take 1 tablet (40 mg total) by mouth daily.   lactulose 10 GM/15ML solution Commonly known as: CHRONULAC Take 20 g by mouth every 4 (four) hours as needed for moderate constipation or severe constipation.   pantoprazole 40 MG tablet Commonly known as: PROTONIX Take 1 tablet (40 mg total) by mouth 2 (two) times daily.   rifaximin 550 MG Tabs tablet Commonly known as: XIFAXAN Take 1 tablet (550 mg total) by mouth 2 (two) times daily.   sodium bicarbonate 650 MG tablet Take 1 tablet (650 mg total) by mouth 2 (two) times daily for 7 days.   spironolactone 100 MG tablet Commonly known as: ALDACTONE Take 1 tablet (100 mg total) by mouth daily.   tamsulosin 0.4 MG Caps capsule Commonly known as: FLOMAX Take 1 capsule (0.4 mg total) by mouth daily.   thiamine 100 MG tablet Take 1 tablet (100 mg total) by mouth daily.        Allergies  Allergen Reactions   Albumin Gc Rash    Patient gets rash with albumin. See media tab picture dated 12/24/21. Needs pre-meds w/ albumin (benadryl) to avoid allergic reaction.     Consultations:    Procedures/Studies: CT HEAD WO CONTRAST (5MM)  Result Date: 12/30/2021 CLINICAL DATA:  Mental status change of unknown cause. Insomnia, nausea, and vomiting. Confusion. EXAM: CT HEAD WITHOUT CONTRAST TECHNIQUE: Contiguous axial images were obtained from the base of the skull through the vertex without intravenous contrast. RADIATION DOSE REDUCTION: This exam was performed according to the departmental dose-optimization program which includes automated exposure control, adjustment of the mA and/or kV according to patient size and/or use of iterative  reconstruction technique. COMPARISON:  11/05/2021 FINDINGS: Brain: No evidence of acute infarction, hemorrhage, hydrocephalus, extra-axial collection or mass lesion/mass effect. Vascular: Moderate intracranial arterial vascular calcifications are present. Skull: Calvarium appears intact. Sinuses/Orbits: Old appearing fracture deformity of the medial right orbital wall, unchanged. Paranasal sinuses and mastoid air cells are clear. Other: None. IMPRESSION: No acute intracranial abnormalities. Electronically Signed   By: Lucienne Capers M.D.   On: 12/30/2021 23:16   CT ABDOMEN PELVIS W CONTRAST  Result Date: 12/18/2021 CLINICAL DATA:  Acute, nonlocalized abdominal pain. Vomiting with abdominal pain and swelling EXAM: CT ABDOMEN AND PELVIS WITH CONTRAST TECHNIQUE: Multidetector CT imaging of the abdomen and pelvis was performed using the standard protocol following bolus administration of intravenous contrast. RADIATION DOSE REDUCTION: This exam was performed according to the departmental dose-optimization program which includes automated exposure control, adjustment of the mA and/or kV according to patient size and/or use of iterative reconstruction technique. CONTRAST:  154m OMNIPAQUE IOHEXOL 300 MG/ML  SOLN COMPARISON:  04/28/2021 FINDINGS: Lower chest: Lower periesophageal varices. Premature atheromatous calcification of the coronaries Hepatobiliary: Known cirrhosis with large caudate lobe and surface lobulation. Heterogeneous perfusion/fat deposition. No focal mass is seen.Large volume ascites, simple and diffusely distributed, less tense than before. 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 Cholelithiasis. Full gallbladder but no detected inflammatory changes are superimposed. Pancreas: Question pancreas divisum. On axial slices, low-density at the upper pancreatic body is volume averaged fat based on reformats. Spleen: Unremarkable Adrenals/Urinary Tract: Negative adrenals. No hydronephrosis or stone.  Unremarkable bladder. Stomach/Bowel:  No obstruction. No appendicitis. Vascular/Lymphatic: Nonocclusive thrombus in the lower main portal vein. No mass or adenopathy. Reproductive:No pathologic findings. Other: No ascites or pneumoperitoneum. Musculoskeletal: No acute abnormalities. IMPRESSION: 1. Cirrhosis with large volume ascites and nonocclusive main portal vein thrombus. 2. Cholelithiasis. Full gallbladder but no focal inflammatory changes. 3.  Periesophageal varices. 4. Premature atherosclerosis including the coronary arteries. Electronically Signed   By: Jorje Guild M.D.   On: 12/18/2021 10:04   US Paracentesis  Result Date: 12/31/2021 INDICATION: Alcoholic cirrhosis with recurrent ascites. Request for therapeutic paracentesis up to 4 L. EXAM: ULTRASOUND GUIDED PARACENTESIS MEDICATIONS: 1% lidocaine 10 mL COMPLICATIONS: None immediate. PROCEDURE: Informed written consent was obtained from the patient after a discussion of the risks, benefits and alternatives to treatment. A timeout was performed prior to the initiation of the procedure. Initial ultrasound scanning demonstrates a large amount of ascites within the right lateral abdomen. The right lateral abdomen was prepped and draped in the usual sterile fashion. 1% lidocaine was used for local anesthesia. Following this, a 19 gauge, 7-cm, Yueh catheter was introduced. An ultrasound image was saved for documentation purposes. The paracentesis was performed. The catheter was removed and a dressing was applied. The patient tolerated the procedure well without immediate post procedural complication. FINDINGS: A total of approximately 4 L of clear yellow fluid was removed. IMPRESSION: Successful ultrasound-guided paracentesis yielding 4 liters of peritoneal fluid. Procedure performed by Gareth Eagle, PA-C Electronically Signed   By: Albin Felling M.D.   On: 12/31/2021 17:01   US Paracentesis  Result Date: 12/18/2021 INDICATION: Recurrent ascites EXAM:  ULTRASOUND GUIDED RLQ PARACENTESIS MEDICATIONS: 10 cc 1% lidocaine. COMPLICATIONS: None immediate. PROCEDURE: Informed written consent was obtained from the patient after a discussion of the risks, benefits and alternatives to treatment. A timeout was performed prior to the initiation of the procedure. Initial ultrasound scanning demonstrates a large amount of ascites within the right lower abdominal quadrant. The right lower abdomen was prepped and draped in the usual sterile fashion. 1% lidocaine was used for local anesthesia. Following this, a Yueh catheter was introduced. An ultrasound image was saved for documentation purposes. The paracentesis was performed. The catheter was removed and a dressing was applied. The patient tolerated the procedure well without immediate post procedural complication. FINDINGS: A total of approximately 3.9 liters of yellow fluid was removed. Samples were sent to the laboratory as requested by the clinical team. IMPRESSION: Successful ultrasound-guided paracentesis yielding 3.9 liters of peritoneal fluid. Read by Lavonia Drafts [facility] Electronically Signed   By: Michaelle Birks M.D.   On: 12/18/2021 19:18   US ABDOMEN LIMITED RUQ (LIVER/GB)  Result Date: 12/18/2021 CLINICAL DATA:  Abdominal pain. EXAM: ULTRASOUND ABDOMEN LIMITED RIGHT UPPER QUADRANT COMPARISON:  None. FINDINGS: Gallbladder: No gallstones or wall thickening visualized. No sonographic Murphy sign noted by sonographer. Common bile duct: Diameter: 4 mm Liver: Decreased liver size. Coarsened echotexture and overall increased echogenicity. Surface nodularity. No defined mass. Portal  vein is patent on color Doppler imaging with normal direction of blood flow towards the liver. Other: Moderate ascites. IMPRESSION: 1. No acute findings.  Normal gallbladder.  No bile duct dilation. 2. Findings consistent with cirrhosis and portal venous hypertension, the latter reflected by moderate ascites. No liver mass. Electronically  Signed   By: Lajean Manes M.D.   On: 12/18/2021 09:59   (Echo, Carotid, EGD, Colonoscopy, ERCP)    Subjective: Pt denies any pain or any complaints    Discharge Exam: Vitals:   01/03/22 0614 01/03/22 0823  BP: 109/70 117/76  Pulse: 81 83  Resp: 18 16  Temp: 98.1 F (36.7 C) 98.4 F (36.9 C)  SpO2: 100% 100%   Vitals:   01/02/22 1601 01/02/22 2040 01/03/22 0614 01/03/22 0823  BP: 108/70 108/74 109/70 117/76  Pulse: 93 78 81 83  Resp: '20 18 18 16  ' Temp: (!) 97.4 F (36.3 C) 97.7 F (36.5 C) 98.1 F (36.7 C) 98.4 F (36.9 C)  TempSrc:    Oral  SpO2: 100% 100% 100% 100%  Weight:      Height:        General: Pt is alert, awake, not in acute distress Cardiovascular: S1/S2 +, no rubs, no gallops Respiratory: CTA bilaterally, no wheezing, no rhonchi Abdominal: Soft, NT, distended (ascites), bowel sounds + Extremities: no cyanosis    The results of significant diagnostics from this hospitalization (including imaging, microbiology, ancillary and laboratory) are listed below for reference.     Microbiology: Recent Results (from the past 240 hour(s))  Resp Panel by RT-PCR (Flu A&B, Covid) Nasopharyngeal Swab     Status: None   Collection Time: 12/30/21  9:15 PM   Specimen: Nasopharyngeal Swab; Nasopharyngeal(NP) swabs in vial transport medium  Result Value Ref Range Status   SARS Coronavirus 2 by RT PCR NEGATIVE NEGATIVE Final    Comment: (NOTE) SARS-CoV-2 target nucleic acids are NOT DETECTED.  The SARS-CoV-2 RNA is generally detectable in upper respiratory specimens during the acute phase of infection. The lowest concentration of SARS-CoV-2 viral copies this assay can detect is 138 copies/mL. A negative result does not preclude SARS-Cov-2 infection and should not be used as the sole basis for treatment or other patient management decisions. A negative result may occur with  improper specimen collection/handling, submission of specimen other than nasopharyngeal  swab, presence of viral mutation(s) within the areas targeted by this assay, and inadequate number of viral copies(<138 copies/mL). A negative result must be combined with clinical observations, patient history, and epidemiological information. The expected result is Negative.  Fact Sheet for Patients:  EntrepreneurPulse.com.au  Fact Sheet for Healthcare Providers:  IncredibleEmployment.be  This test is no t yet approved or cleared by the [place] FDA and  has been authorized for detection and/or diagnosis of SARS-CoV-2 by FDA under an Emergency Use Authorization (EUA). This EUA will remain  in effect (meaning this test can be used) for the duration of the COVID-19 declaration under Section 564(b)(1) of the Act, 21 U.S.C.section 360bbb-3(b)(1), unless the authorization is terminated  or revoked sooner.       Influenza A by PCR NEGATIVE NEGATIVE Final   Influenza B by PCR NEGATIVE NEGATIVE Final    Comment: (NOTE) The Xpert Xpress SARS-CoV-2/FLU/RSV plus assay is intended as an aid in the diagnosis of influenza from Nasopharyngeal swab specimens and should not be used as a sole basis for treatment. Nasal washings and aspirates are unacceptable for Xpert Xpress SARS-CoV-2/FLU/RSV testing.  Fact Sheet for  Patients: EntrepreneurPulse.com.au  Fact Sheet for Healthcare Providers: IncredibleEmployment.be  This test is not yet approved or cleared by the [place] FDA and has been authorized for detection and/or diagnosis of SARS-CoV-2 by FDA under an Emergency Use Authorization (EUA). This EUA will remain in effect (meaning this test can be used) for the duration of the COVID-19 declaration under Section 564(b)(1) of the Act, 21 U.S.C. section 360bbb-3(b)(1), unless the authorization is terminated or revoked.  Performed at [facility], [address] [place], [place] [address]   CULTURE,  BLOOD (ROUTINE X 2) w Reflex to ID Panel     Status: None (Preliminary result)   Collection Time: 01/01/22  9:10 AM   Specimen: BLOOD  Result Value Ref Range Status   Specimen Description BLOOD BLOOD RIGHT HAND  Final   Special Requests   Final    BOTTLES DRAWN AEROBIC AND ANAEROBIC Blood Culture adequate volume   Culture   Final    NO GROWTH 2 DAYS Performed at [facility], [address] [place], [place] [address]    Report Status PENDING  Incomplete  CULTURE, BLOOD (ROUTINE X 2) w Reflex to ID Panel     Status: None (Preliminary result)   Collection Time: 01/01/22  9:11 AM   Specimen: BLOOD  Result Value Ref Range Status   Specimen Description BLOOD LEFT ANTECUBITAL  Final   Special Requests   Final    BOTTLES DRAWN AEROBIC AND ANAEROBIC Blood Culture adequate volume   Culture   Final    NO GROWTH 2 DAYS Performed at [facility], [address] [place], [place] [address]    Report Status PENDING  Incomplete     Labs: BNP (last 3 results) Recent Labs    04/12/21 1206  BNP 25.4   Basic Metabolic Panel: Recent Labs  Lab 12/30/21 2115 12/31/21 0620 01/01/22 0604 01/02/22 0327 01/03/22 0337  NA 128* 130* 126* 126* 123*  K 4.2 4.0 4.6 4.4 4.4  CL 106 110 103 103 101  CO2 16* 16* 14* 18* 15*  GLUCOSE 225* 136* 144* 181* 152*  BUN 18 18 22* 33* 35*  CREATININE 1.16 1.01 1.54* 1.74* 1.60*  CALCIUM 8.2* 7.9* 8.1* 7.7* 7.8*   Liver Function Tests: Recent Labs  Lab 12/30/21 2115 12/31/21 0620 01/01/22 0604 01/02/22 0327 01/03/22 0337  AST 28 27 35 27 35  ALT '23 20 24 20 21  ' ALKPHOS 157* 123 139* 112 121  BILITOT 2.3* 1.8* 1.7* 1.1 1.3*  PROT 6.7 5.6* 6.4* 5.3* 5.5*  ALBUMIN 3.0* 2.6* 2.7* 2.3* 2.2*   Recent Labs  Lab 12/30/21 2115  LIPASE 44   Recent Labs  Lab 12/30/21 2115 12/31/21 0620 01/01/22 0604 01/02/22 0327 01/03/22 0337  AMMONIA 150* 59* 31 71* 67*   CBC: Recent Labs  Lab 12/30/21 2115 12/31/21 0620  01/01/22 0604 01/02/22 0327 01/03/22 0337  WBC 5.3 4.6 6.5 5.8 5.2  NEUTROABS 3.5  --   --   --   --   HGB 9.1* 8.2* 9.9* 8.3* 8.6*  HCT 25.2* 23.4* 28.4* 22.6* 24.6*  MCV 94.7 93.2 90.7 93.8 90.4  PLT 125* 120* 119* 89* 95*   Cardiac Enzymes: No results for input(s): CKTOTAL, CKMB, CKMBINDEX, TROPONINI in the last 168 hours. BNP: Invalid input(s): POCBNP CBG: Recent Labs  Lab 01/02/22 1148 01/02/22 1603 01/02/22 2027 01/03/22 0823 01/03/22 1239  GLUCAP 191* 188* 191* 140* 162*   D-Dimer No results for input(s): DDIMER in the last  72 hours. Hgb A1c No results for input(s): HGBA1C in the last 72 hours. Lipid Profile No results for input(s): CHOL, HDL, LDLCALC, TRIG, CHOLHDL, LDLDIRECT in the last 72 hours. Thyroid function studies No results for input(s): TSH, T4TOTAL, T3FREE, THYROIDAB in the last 72 hours.  Invalid input(s): FREET3 Anemia work up No results for input(s): VITAMINB12, FOLATE, FERRITIN, TIBC, IRON, RETICCTPCT in the last 72 hours. Urinalysis    Component Value Date/Time   COLORURINE AMBER (A) 04/28/2021 1736   APPEARANCEUR CLEAR (A) 04/28/2021 1736   LABSPEC 1.012 04/28/2021 1736   PHURINE 6.0 04/28/2021 1736   GLUCOSEU NEGATIVE 04/28/2021 1736   HGBUR MODERATE (A) 04/28/2021 1736   BILIRUBINUR NEGATIVE 04/28/2021 1736   KETONESUR NEGATIVE 04/28/2021 1736   PROTEINUR 30 (A) 04/28/2021 1736   NITRITE NEGATIVE 04/28/2021 1736   LEUKOCYTESUR NEGATIVE 04/28/2021 1736   Sepsis Labs Invalid input(s): PROCALCITONIN,  WBC,  LACTICIDVEN Microbiology Recent Results (from the past 240 hour(s))  Resp Panel by RT-PCR (Flu A&B, Covid) Nasopharyngeal Swab     Status: None   Collection Time: 12/30/21  9:15 PM   Specimen: Nasopharyngeal Swab; Nasopharyngeal(NP) swabs in vial transport medium  Result Value Ref Range Status   SARS Coronavirus 2 by RT PCR NEGATIVE NEGATIVE Final    Comment: (NOTE) SARS-CoV-2 target nucleic acids are NOT DETECTED.  The  SARS-CoV-2 RNA is generally detectable in upper respiratory specimens during the acute phase of infection. The lowest concentration of SARS-CoV-2 viral copies this assay can detect is 138 copies/mL. A negative result does not preclude SARS-Cov-2 infection and should not be used as the sole basis for treatment or other patient management decisions. A negative result may occur with  improper specimen collection/handling, submission of specimen other than nasopharyngeal swab, presence of viral mutation(s) within the areas targeted by this assay, and inadequate number of viral copies(<138 copies/mL). A negative result must be combined with clinical observations, patient history, and epidemiological information. The expected result is Negative.  Fact Sheet for Patients:  EntrepreneurPulse.com.au  Fact Sheet for Healthcare Providers:  IncredibleEmployment.be  This test is no t yet approved or cleared by the [place] FDA and  has been authorized for detection and/or diagnosis of SARS-CoV-2 by FDA under an Emergency Use Authorization (EUA). This EUA will remain  in effect (meaning this test can be used) for the duration of the COVID-19 declaration under Section 564(b)(1) of the Act, 21 U.S.C.section 360bbb-3(b)(1), unless the authorization is terminated  or revoked sooner.       Influenza A by PCR NEGATIVE NEGATIVE Final   Influenza B by PCR NEGATIVE NEGATIVE Final    Comment: (NOTE) The Xpert Xpress SARS-CoV-2/FLU/RSV plus assay is intended as an aid in the diagnosis of influenza from Nasopharyngeal swab specimens and should not be used as a sole basis for treatment. Nasal washings and aspirates are unacceptable for Xpert Xpress SARS-CoV-2/FLU/RSV testing.  Fact Sheet for Patients: EntrepreneurPulse.com.au  Fact Sheet for Healthcare Providers: IncredibleEmployment.be  This test is not yet approved or  cleared by the [place] FDA and has been authorized for detection and/or diagnosis of SARS-CoV-2 by FDA under an Emergency Use Authorization (EUA). This EUA will remain in effect (meaning this test can be used) for the duration of the COVID-19 declaration under Section 564(b)(1) of the Act, 21 U.S.C. section 360bbb-3(b)(1), unless the authorization is terminated or revoked.  Performed at [facility], [address] [place], [place] [address]   CULTURE, BLOOD (ROUTINE X 2) w Reflex to  ID Panel     Status: None (Preliminary result)   Collection Time: 01/01/22  9:10 AM   Specimen: BLOOD  Result Value Ref Range Status   Specimen Description BLOOD BLOOD RIGHT HAND  Final   Special Requests   Final    BOTTLES DRAWN AEROBIC AND ANAEROBIC Blood Culture adequate volume   Culture   Final    NO GROWTH 2 DAYS Performed at [facility], [address], [place], [place] [address]    Report Status PENDING  Incomplete  CULTURE, BLOOD (ROUTINE X 2) w Reflex to ID Panel     Status: None (Preliminary result)   Collection Time: 01/01/22  9:11 AM   Specimen: BLOOD  Result Value Ref Range Status   Specimen Description BLOOD LEFT ANTECUBITAL  Final   Special Requests   Final    BOTTLES DRAWN AEROBIC AND ANAEROBIC Blood Culture adequate volume   Culture   Final    NO GROWTH 2 DAYS Performed at [facility], [address], [place], [place] [address]    Report Status PENDING  Incomplete     Time coordinating discharge: Over 30 minutes  SIGNED:   Wyvonnia Dusky, MD  [facility] 01/03/2022, 12:42 PM Pager   If 7PM-7AM, please contact night-coverage

## 2022-01-03 NOTE — Evaluation (Signed)
Physical Therapy Evaluation Patient Details Name: Shawn Torres MRN: [id number] DOB: May 11, 1966 Today's Date: 01/03/2022  History of Present Illness  Pt is a 56 y.o. hispanic male with medical history significant for alcoholic liver cirrhosis and ascites, type 2 diabetes mellitus, who presented to emergency room with acute onset of altered mental status with confusion. MD assessment includes: Hepatic encephalopathy, Alcohol liver cirrhosis with ascites s/p paracentesis on 12/31/21 with 4L removed, fever, thrombocytopenia, anemia, hyponatremia, and metabolic acidosis.   Clinical Impression  Pt was pleasant and motivated to participate during the session and put forth good effort throughout. Pt was Ind/Mod Ind with all functional tasks with good control and stability throughout.  Pt reports feeling that he is at his baseline with no need of PT services going forward. Will complete PT orders at this time but will reassess pt pending a change in status upon receipt of new PT orders.         Recommendations for follow up therapy are one component of a multi-disciplinary discharge planning process, led by the attending physician.  Recommendations may be updated based on patient status, additional functional criteria and insurance authorization.  Follow Up Recommendations No PT follow up    Assistance Recommended at Discharge None  Patient can return home with the following  Assist for transportation    Equipment Recommendations None recommended by PT  Recommendations for Other Services       Functional Status Assessment Patient has not had a recent decline in their functional status     Precautions / Restrictions Precautions Precautions: None Restrictions Weight Bearing Restrictions: No      Mobility  Bed Mobility Overal bed mobility: Independent                  Transfers Overall transfer level: Independent                 General transfer comment: Good  eccentric and concentric control and stability    Ambulation/Gait Ambulation/Gait assistance: Independent Gait Distance (Feet): 150 Feet Assistive device: None Gait Pattern/deviations: WFL(Within Functional Limits) Gait velocity: WFL     General Gait Details: No instability noted during ambulation without an AD including during start/stops and sharp turns with SpO2 and HR WNL  Stairs Stairs: Yes Stairs assistance: Modified independent (Device/Increase time) Stair Management: One rail Right, Forwards, Alternating pattern Number of Stairs: 4 General stair comments: Good control and stability throughout  Wheelchair Mobility    Modified Rankin (Stroke Patients Only)       Balance Overall balance assessment: No apparent balance deficits (not formally assessed)                                           Pertinent Vitals/Pain Pain Assessment Pain Assessment: 0-10 Pain Score: 8  Pain Location: Low back Pain Descriptors / Indicators: Sore Pain Intervention(s): Premedicated before session, Monitored during session    Home Living Family/patient expects to be discharged to:: Private residence Living Arrangements: Children Available Help at Discharge: Available 24 hours/day;Family;Friend(s) Type of Home: Mobile home Home Access: Stairs to enter Entrance Stairs-Rails: Can reach both;Right;Left Entrance Stairs-Number of Steps: 3   Home Layout: One level Home Equipment: Conservation officer, nature (2 wheels) Additional Comments: .    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 Multiple falls in the last 6 months (pt does not remember falls) all related to preiods of "confusion". ADLs Comments: Assists with cooking/cleaning.     Hand Dominance        Extremity/Trunk Assessment   Upper Extremity Assessment Upper Extremity  Assessment: Overall WFL for tasks assessed    Lower Extremity Assessment Lower Extremity Assessment: Overall WFL for tasks assessed       Communication   Communication: Prefers language other than [place];Interpreter utilized;Other (comment) (In house interpreter Herb Grays utilized throughout the session)  Cognition Arousal/Alertness: Awake/alert Behavior During Therapy: WFL for tasks assessed/performed Overall Cognitive Status: Within Functional Limits for tasks assessed                                          General Comments      Exercises Other Exercises Other Exercises: Pt education provided on importance of frequent short bouts of ambulation to tolerance throughout the day   Assessment/Plan    PT Assessment Patient does not need any further PT services  PT Problem List         PT Treatment Interventions      PT Goals (Current goals can be found in the Care Plan section)  Acute Rehab PT Goals PT Goal Formulation: All assessment and education complete, DC therapy    Frequency       Co-evaluation               AM-PAC PT "6 Clicks" Mobility  Outcome Measure Help needed turning from your back to your side while in a flat bed without using bedrails?: None Help needed moving from lying on your back to sitting on the side of a flat bed without using bedrails?: None Help needed moving to and from a bed to a chair (including a wheelchair)?: None Help needed standing up from a chair using your arms (e.g., wheelchair or bedside chair)?: None Help needed to walk in hospital room?: None Help needed climbing 3-5 steps with a railing? : None 6 Click Score: 24    End of Session Equipment Utilized During Treatment: Gait belt Activity Tolerance: Patient tolerated treatment well Patient left: in bed;with call bell/phone within reach Nurse Communication: Mobility status PT Visit Diagnosis: Muscle weakness (generalized) (M62.81)    Time: [id number] PT  Time Calculation (min) (ACUTE ONLY): 15 min   Charges:   PT Evaluation $PT Eval Low Complexity: 1 Low          D. Scott Binta Statzer PT, DPT 01/03/22, 10:06 AM

## 2022-01-03 NOTE — Progress Notes (Signed)
Patient being discharged home. IV removed. PNA vaccine given. Patients daughter came and went over discharge instructions with her and the patient. All questions answered and stated they understood. Patient going home POV with daughter.

## 2022-01-03 NOTE — Clinical Social Work Note (Signed)
°  Transition of Care [facility]) Screening Note   Patient Details  Name: Shawn Torres Date of Birth: 1966-10-06   Transition of Care [facility]) CM/SW Contact:    Eileen Stanford, LCSW Phone [place] 01/03/2022, 3:39 PM    Transition of Care Department [facility]) has reviewed patient and no TOC needs have been identified at this time. We will continue to monitor patient advancement through interdisciplinary progression rounds. If new patient transition needs arise, please place a TOC consult.

## 2022-01-06 LAB — CULTURE, BLOOD (ROUTINE X 2)
Culture: NO GROWTH
Culture: NO GROWTH
Special Requests: ADEQUATE
Special Requests: ADEQUATE

## 2022-01-18 LAB — MISC LABCORP TEST (SEND OUT)
LabCorp test name: 5367
Labcorp test code: 9985

## 2022-01-19 ENCOUNTER — Encounter: Payer: Self-pay | Admitting: Gastroenterology

## 2022-01-19 NOTE — H&P (Signed)
? ?Pre-Procedure H&P ?  ?Patient ID: Shawn Torres is a 56 y.o. male. ? ?Gastroenterology Provider: Annamaria Helling, DO ? ?PCP: Shawn Never, MD ? ?Date: 01/20/2022 ? ?HPI ?[name] is a 56 y.o. male who presents today for Esophagogastroduodenoscopy for Esophageal varices retreatment. ? ?Information is garnered via video Spanish medical interpreter.  Currently being followed by [facility] hepatology as well. ? ?Taking beta-blocker-nadolol 20mg   ? ?Last EGD performed on February 6 demonstrating portal hypertensive gastropathy and to grade 2 esophageal varices that underwent banding with decompression. ? ?Last INR was 1.6 platelet 151,000 creatinine 1.3 ? ?Denies melena and hematochezia.  No hematemesis or coffee-ground emesis. ? ?Past Medical History:  ?Diagnosis Date  ? Ascites   ? Cirrhosis, alcoholic ([place])   ? Diabetes mellitus without complication ([place])   ? Memory loss   ? ? ?Past Surgical History:  ?Procedure Laterality Date  ? ESOPHAGOGASTRODUODENOSCOPY N/A 10/10/2021  ? Procedure: ESOPHAGOGASTRODUODENOSCOPY (EGD);  Surgeon: [name], MD;  Location: [facility] ENDOSCOPY;  Service: Endoscopy;  Laterality: N/A;  ? ESOPHAGOGASTRODUODENOSCOPY N/A 12/27/2021  ? Procedure: ESOPHAGOGASTRODUODENOSCOPY (EGD);  Surgeon: Annamaria Helling, DO;  Location: [facility] ENDOSCOPY;  Service: Gastroenterology;  Laterality: N/A;  Spanish Interpreter  ? ESOPHAGOGASTRODUODENOSCOPY (EGD) WITH PROPOFOL N/A 12/11/2021  ? Procedure: ESOPHAGOGASTRODUODENOSCOPY (EGD) WITH PROPOFOL;  Surgeon: Annamaria Helling, DO;  Location: [facility] ENDOSCOPY;  Service: Gastroenterology;  Laterality: N/A;  ? ? ?Family History ?No h/o GI disease or malignancy ? ?Review of Systems  ?Constitutional:  Negative for activity change, appetite change, chills, diaphoresis, fatigue, fever and unexpected weight change.  ?HENT:  Negative for trouble swallowing and voice change.   ?Respiratory:  Negative for shortness of breath and wheezing.    ?Cardiovascular:  Negative for chest pain, palpitations and leg swelling.  ?Gastrointestinal:  Negative for abdominal distention, abdominal pain, anal bleeding, blood in stool, constipation, diarrhea, nausea and vomiting.  ?Musculoskeletal:  Negative for arthralgias and myalgias.  ?Skin:  Negative for color change and pallor.  ?Neurological:  Negative for dizziness, syncope and weakness.  ?Psychiatric/Behavioral:  Negative for confusion. The patient is not nervous/anxious.   ?All other systems reviewed and are negative.  ? ?Medications ?No current facility-administered medications on file prior to encounter.  ? ?Current Outpatient Medications on File Prior to Encounter  ?Medication Sig Dispense Refill  ? lactulose (CHRONULAC) 10 GM/15ML solution Take 20 g by mouth every 4 (four) hours as needed for moderate constipation or severe constipation.    ? pantoprazole (PROTONIX) 40 MG tablet Take 1 tablet (40 mg total) by mouth 2 (two) times daily. 60 tablet 0  ? feeding supplement (ENSURE ENLIVE / ENSURE PLUS) LIQD Take 237 mLs by mouth 3 (three) times daily between meals. [id number] mL 12  ? folic acid (FOLVITE) 1 MG tablet Take 1 tablet by mouth daily.    ? furosemide (LASIX) 40 MG tablet Take 1 tablet (40 mg total) by mouth daily. 30 tablet 2  ? Nutritional Supplements (,FEEDING SUPPLEMENT, PROSOURCE PLUS) liquid Take 30 mLs by mouth 2 (two) times daily between meals. 887 mL 1  ? spironolactone (ALDACTONE) 100 MG tablet Take 1 tablet (100 mg total) by mouth daily. 30 tablet 2  ? tamsulosin (FLOMAX) 0.4 MG CAPS capsule Take 1 capsule (0.4 mg total) by mouth daily. 30 capsule 0  ? thiamine 100 MG tablet Take 1 tablet (100 mg total) by mouth daily. 30 tablet 0  ? ? ?Pertinent medications related to GI and procedure were reviewed by me  with the patient prior to the procedure ? ? ?Current Facility-Administered Medications:  ?  0.9 %  sodium chloride infusion, , Intravenous, Continuous, Annamaria Helling, DO, Last Rate: 20  mL/hr at 01/20/22 1032, New Bag at 01/20/22 1032 ?  ?  ? ?Allergies  ?Allergen Reactions  ? Albumin Gc Rash  ?  Patient gets rash with albumin. See media tab picture dated 12/24/21. Needs pre-meds w/ albumin (benadryl) to avoid allergic reaction.   ? ?Allergies were reviewed by me prior to the procedure ? ?Objective  ? ? ?Vitals:  ? 01/20/22 1014  ?BP: 109/75  ?Pulse: 69  ?Resp: 18  ?Temp: (!) 96.3 ?F (35.7 ?C)  ?TempSrc: Temporal  ?SpO2: 100%  ?Weight: 81.6 kg  ?Height: 5\' 7"  (1.702 m)  ? ? ?Physical Exam ?Vitals and nursing note reviewed.  ?Constitutional:   ?   General: He is not in acute distress. ?   Appearance: He is not ill-appearing, toxic-appearing or diaphoretic.  ?HENT:  ?   Head: Normocephalic and atraumatic.  ?   Nose: Nose normal.  ?   Mouth/Throat:  ?   Mouth: Mucous membranes are moist.  ?   Pharynx: Oropharynx is clear.  ?Eyes:  ?   General: No scleral icterus. ?   Extraocular Movements: Extraocular movements intact.  ?Cardiovascular:  ?   Rate and Rhythm: Normal rate and regular rhythm.  ?   Heart sounds: Normal heart sounds. No murmur heard. ?  No friction rub. No gallop.  ?Pulmonary:  ?   Effort: Pulmonary effort is normal. No respiratory distress.  ?   Breath sounds: Normal breath sounds. No wheezing, rhonchi or rales.  ?Abdominal:  ?   General: Bowel sounds are normal. There is distension.  ?   Palpations: Abdomen is soft.  ?   Tenderness: There is no abdominal tenderness. There is no guarding or rebound.  ?Musculoskeletal:  ?   Cervical back: Neck supple.  ?   Right lower leg: No edema.  ?   Left lower leg: No edema.  ?Skin: ?   General: Skin is warm and dry.  ?   Coloration: Skin is not jaundiced or pale.  ?Neurological:  ?   General: No focal deficit present.  ?   Mental Status: He is alert and oriented to person, place, and time. Mental status is at baseline.  ?Psychiatric:     ?   Mood and Affect: Mood normal.     ?   Behavior: Behavior normal.     ?   Thought Content: Thought content  normal.     ?   Judgment: Judgment normal.  ? ? ? ?Assessment:  ?[name] is a 56 y.o. male  who presents today for Esophagogastroduodenoscopy for Esophageal varices retreatment. ? ?Plan:  ?Esophagogastroduodenoscopy with possible intervention today ? ?Esophagogastroduodenoscopy with possible biopsy, control of bleeding, polypectomy, and interventions as necessary has been discussed with the patient/patient representative. Informed consent was obtained from the patient/patient representative after explaining the indication, nature, and risks of the procedure including but not limited to death, bleeding, perforation, missed neoplasm/lesions, cardiorespiratory compromise, and reaction to medications. Opportunity for questions was given and appropriate answers were provided. Patient/patient representative has verbalized understanding is amenable to undergoing the procedure. ? ? ?Annamaria Helling, DO  ?[facility] ? ?Portions of the record may have been created with voice recognition software. Occasional wrong-word or 'sound-a-like' substitutions may have occurred due to the inherent limitations of voice recognition software.  Read the chart carefully and recognize, using context, where substitutions may have occurred. ?

## 2022-01-20 ENCOUNTER — Other Ambulatory Visit: Payer: Self-pay

## 2022-01-20 ENCOUNTER — Ambulatory Visit: Payer: Self-pay | Admitting: Certified Registered"

## 2022-01-20 ENCOUNTER — Encounter: Payer: Self-pay | Admitting: Gastroenterology

## 2022-01-20 ENCOUNTER — Ambulatory Visit
Admission: RE | Admit: 2022-01-20 | Discharge: 2022-01-20 | Disposition: A | Discharge status: Home / Self Care | Payer: Self-pay | Attending: Gastroenterology | Admitting: Gastroenterology

## 2022-01-20 ENCOUNTER — Encounter
Admission: RE | Disposition: A | Discharge status: Home / Self Care | Payer: Self-pay | Source: Home / Self Care | Attending: Gastroenterology

## 2022-01-20 DIAGNOSIS — K219 Gastro-esophageal reflux disease without esophagitis: Secondary | ICD-10-CM | POA: Insufficient documentation

## 2022-01-20 DIAGNOSIS — K766 Portal hypertension: Secondary | ICD-10-CM | POA: Insufficient documentation

## 2022-01-20 DIAGNOSIS — E669 Obesity, unspecified: Secondary | ICD-10-CM | POA: Insufficient documentation

## 2022-01-20 DIAGNOSIS — Z6828 Body mass index (BMI) 28.0-28.9, adult: Secondary | ICD-10-CM | POA: Insufficient documentation

## 2022-01-20 DIAGNOSIS — K3189 Other diseases of stomach and duodenum: Secondary | ICD-10-CM | POA: Insufficient documentation

## 2022-01-20 DIAGNOSIS — I8510 Secondary esophageal varices without bleeding: Secondary | ICD-10-CM | POA: Insufficient documentation

## 2022-01-20 DIAGNOSIS — D759 Disease of blood and blood-forming organs, unspecified: Secondary | ICD-10-CM | POA: Insufficient documentation

## 2022-01-20 DIAGNOSIS — E1122 Type 2 diabetes mellitus with diabetic chronic kidney disease: Secondary | ICD-10-CM | POA: Insufficient documentation

## 2022-01-20 DIAGNOSIS — K7030 Alcoholic cirrhosis of liver without ascites: Secondary | ICD-10-CM | POA: Insufficient documentation

## 2022-01-20 DIAGNOSIS — D649 Anemia, unspecified: Secondary | ICD-10-CM | POA: Insufficient documentation

## 2022-01-20 DIAGNOSIS — N189 Chronic kidney disease, unspecified: Secondary | ICD-10-CM | POA: Insufficient documentation

## 2022-01-20 HISTORY — PX: ESOPHAGOGASTRODUODENOSCOPY: SHX5428

## 2022-01-20 SURGERY — EGD (ESOPHAGOGASTRODUODENOSCOPY)
Anesthesia: General

## 2022-01-20 MED ORDER — PROPOFOL 10 MG/ML IV BOLUS
INTRAVENOUS | Status: AC
  Filled 2022-01-20: qty 20

## 2022-01-20 MED ORDER — MIDAZOLAM HCL 2 MG/2ML IJ SOLN
INTRAMUSCULAR | Status: AC
  Filled 2022-01-20: qty 2

## 2022-01-20 MED ORDER — LIDOCAINE HCL (CARDIAC) PF 100 MG/5ML IV SOSY
PREFILLED_SYRINGE | INTRAVENOUS | Status: DC | PRN
  Administered 2022-01-20: 100 mg via INTRAVENOUS

## 2022-01-20 MED ORDER — MIDAZOLAM HCL 2 MG/2ML IJ SOLN
INTRAMUSCULAR | Status: DC | PRN
  Administered 2022-01-20: 2 mg via INTRAVENOUS

## 2022-01-20 MED ORDER — LIDOCAINE HCL (PF) 2 % IJ SOLN
INTRAMUSCULAR | Status: AC
  Filled 2022-01-20: qty 5

## 2022-01-20 MED ORDER — PROPOFOL 10 MG/ML IV BOLUS
INTRAVENOUS | Status: DC | PRN
Start: 2022-01-20 — End: 2022-01-20
  Administered 2022-01-20: 20 mg via INTRAVENOUS
  Administered 2022-01-20: 140 mg via INTRAVENOUS
  Administered 2022-01-20 (×4): 20 mg via INTRAVENOUS

## 2022-01-20 MED ORDER — SODIUM CHLORIDE 0.9 % IV SOLN: INTRAVENOUS | Status: DC

## 2022-01-20 NOTE — Interval H&P Note (Signed)
History and Physical Interval Note: Preprocedure H&P from 01/20/22 ? was reviewed and there was no interval change after seeing and examining the patient.  Written consent was obtained from the patient after discussion of risks, benefits, and alternatives. Patient has consented to proceed with Esophagogastroduodenoscopy with possible intervention ? ? ?01/20/2022 ?10:54 AM ? ?Shawn Torres  has presented today for surgery, with the diagnosis of ALCOHOLIC CIRRHOSIS.  The various methods of treatment have been discussed with the patient and family. After consideration of risks, benefits and other options for treatment, the patient has consented to  Procedure(s) with comments: ?ESOPHAGOGASTRODUODENOSCOPY (EGD) (N/A) - Spanish Interpreter as a surgical intervention.  The patient's history has been reviewed, patient examined, no change in status, stable for surgery.  I have reviewed the patient's chart and labs.  Questions were answered to the patient's satisfaction.   ? ? ?Annamaria Helling ? ? ?

## 2022-01-20 NOTE — Anesthesia Preprocedure Evaluation (Signed)
Anesthesia Evaluation  ?Patient identified by MRN, date of birth, ID band ?Patient awake ? ? ? ?Reviewed: ?Allergy & Precautions, NPO status , Patient's Chart, lab work & pertinent test results ? ?History of Anesthesia Complications ?(+) PSEUDOCHOLINESTERASE DEFICIENCY and history of anesthetic complications ? ?Airway ?Mallampati: II ? ?TM Distance: >3 FB ?Neck ROM: Full ? ? ? Dental ? ?(+) Teeth Intact ?  ?Pulmonary ?neg pulmonary ROS, pneumonia, resolved,  ?  ?Pulmonary exam normal ? ?+ decreased breath sounds ? ? ? ? ? Cardiovascular ?Exercise Tolerance: Good ?negative cardio ROS ?Normal cardiovascular exam ?Rhythm:Regular  ? ?  ?Neuro/Psych ?negative neurological ROS ? negative psych ROS  ? GI/Hepatic ?negative GI ROS, Neg liver ROS, GERD  Medicated,(+) Cirrhosis  ? ascites ?  ? ,   ?Endo/Other  ?negative endocrine ROSdiabetes ? Renal/GU ?CRFRenal diseasenegative Renal ROS  ?negative genitourinary ?  ?Musculoskeletal ?negative musculoskeletal ROS ?(+)  ? Abdominal ?(+) + obese, Ascites  ?Peds ?negative pediatric ROS ?(+)  Hematology ?negative hematology ROS ?(+) Blood dyscrasia, anemia ,   ?Anesthesia Other Findings ?Past Medical History: ?No date: Ascites ?No date: Cirrhosis, alcoholic ([facility]) ?No date: Diabetes mellitus without complication ([facility]) ?No date: Memory loss ? ?Past Surgical History: ?10/10/2021: ESOPHAGOGASTRODUODENOSCOPY; N/A ?    Comment:  Procedure: ESOPHAGOGASTRODUODENOSCOPY (EGD);  Surgeon:  ?             Midge Minium, MD;  Location: [facility] ENDOSCOPY;  Service:  ?             Endoscopy;  Laterality: N/A; ?12/27/2021: ESOPHAGOGASTRODUODENOSCOPY; N/A ?    Comment:  Procedure: ESOPHAGOGASTRODUODENOSCOPY (EGD);  Surgeon:  ?             [name], DO;  Location: [facility] ENDOSCOPY;   ?             Service: Gastroenterology;  Laterality: N/A;  Spanish  ?             Interpreter ?12/11/2021: ESOPHAGOGASTRODUODENOSCOPY (EGD) WITH PROPOFOL; N/A ?    Comment:   Procedure: ESOPHAGOGASTRODUODENOSCOPY (EGD) WITH  ?             PROPOFOL;  Surgeon: [name], DO;  Location: ?             [facility] ENDOSCOPY;  Service: Gastroenterology;  Laterality:  ?             N/A; ? ? ? ? Reproductive/Obstetrics ?negative OB ROS ? ?  ? ? ? ? ? ? ? ? ? ? ? ? ? ?  ?  ? ? ? ? ? ? ? ? ?Anesthesia Physical ?Anesthesia Plan ? ?ASA: 4 ? ?Anesthesia Plan: General  ? ?Post-op Pain Management:   ? ?Induction: Intravenous ? ?PONV Risk Score and Plan: Propofol infusion and TIVA ? ?Airway Management Planned: Natural Airway and Nasal Cannula ? ?Additional Equipment:  ? ?Intra-op Plan:  ? ?Post-operative Plan:  ? ?Informed Consent: I have reviewed the patients History and Physical, chart, labs and discussed the procedure including the risks, benefits and alternatives for the proposed anesthesia with the patient or authorized representative who has indicated his/her understanding and acceptance.  ? ? ? ?Dental Advisory Given ? ?Plan Discussed with: CRNA and Surgeon ? ?Anesthesia Plan Comments:   ? ? ? ? ? ? ?Anesthesia Quick Evaluation ? ?

## 2022-01-20 NOTE — Anesthesia Postprocedure Evaluation (Signed)
Anesthesia Post Note ? ?Patient: Shawn Torres ? ?Procedure(s) Performed: ESOPHAGOGASTRODUODENOSCOPY (EGD) ? ?Patient location during evaluation: PACU ?Anesthesia Type: General ?Level of consciousness: awake ?Pain management: satisfactory to patient ?Vital Signs Assessment: post-procedure vital signs reviewed and stable ?Respiratory status: spontaneous breathing ?Cardiovascular status: stable ?Anesthetic complications: no ? ? ?No notable events documented. ? ? ?Last Vitals:  ?Vitals:  ? 01/20/22 1140 01/20/22 1150  ?BP: 104/63 117/67  ?Pulse: (!) 59 (!) 57  ?Resp: 19 18  ?Temp:    ?SpO2: 98% 100%  ?  ?Last Pain:  ?Vitals:  ? 01/20/22 1150  ?TempSrc:   ?PainSc: 0-No pain  ? ? ?  ?  ?  ?  ?  ?  ? ?VAN STAVEREN,Aileene Lanum ? ? ? ? ?

## 2022-01-20 NOTE — Op Note (Signed)
[facility] ?Gastroenterology ?Patient Name: Shawn Torres ?Procedure Date: 01/20/2022 10:57 AM ?MRN: [id number] ?Account #: [id number] ?Date of Birth: [birth year] ?Admit Type: Outpatient ?Age: 56 ?Room: [facility] ENDO ROOM 1 ?Gender: Male ?Note Status: Finalized ?Instrument Name: Upper Endoscope [id number] ?Procedure:             Upper GI endoscopy ?Indications:           Esophageal varices ?Providers:             Annamaria Helling DO, DO ?Referring MD:          No Local Md, MD (Referring MD) ?Medicines:             Monitored Anesthesia Care ?Complications:         No immediate complications. Estimated blood loss: None. ?Procedure:             Pre-Anesthesia Assessment: ?                       - Prior to the procedure, a History and Physical was  ?                       performed, and patient medications and allergies were  ?                       reviewed. The patient is competent. The risks and  ?                       benefits of the procedure and the sedation options and  ?                       risks were discussed with the patient. All questions  ?                       were answered and informed consent was obtained.  ?                       Patient identification and proposed procedure were  ?                       verified by the physician, the nurse, the anesthetist  ?                       and the technician in the endoscopy suite. Mental  ?                       Status Examination: alert and oriented. Airway  ?                       Examination: normal oropharyngeal airway and neck  ?                       mobility. Respiratory Examination: clear to  ?                       auscultation. CV Examination: bradycardia noted.  ?                       Prophylactic Antibiotics: The patient does not require  ?  prophylactic antibiotics. Prior Anticoagulants: The  ?                       patient has taken no previous anticoagulant or  ?                       antiplatelet agents. ASA  Grade Assessment: IV - A  ?                       patient with severe systemic disease that is a  ?                       constant threat to life. After reviewing the risks and  ?                       benefits, the patient was deemed in satisfactory  ?                       condition to undergo the procedure. The anesthesia  ?                       plan was to use monitored anesthesia care (MAC).  ?                       Immediately prior to administration of medications,  ?                       the patient was re-assessed for adequacy to receive  ?                       sedatives. The heart rate, respiratory rate, oxygen  ?                       saturations, blood pressure, adequacy of pulmonary  ?                       ventilation, and response to care were monitored  ?                       throughout the procedure. The physical status of the  ?                       patient was re-assessed after the procedure. ?                       After obtaining informed consent, the endoscope was  ?                       passed under direct vision. Throughout the procedure,  ?                       the patient's blood pressure, pulse, and oxygen  ?                       saturations were monitored continuously. The Endoscope  ?                       was introduced through the mouth, and advanced to the  ?  second part of duodenum. The upper GI endoscopy was  ?                       accomplished without difficulty. The patient tolerated  ?                       the procedure well. ?Findings: ?     The duodenal bulb, first portion of the duodenum and second portion of  ?     the duodenum were normal. Estimated blood loss: none. ?     Mild portal hypertensive gastropathy was found in the entire examined  ?     stomach. Estimated blood loss: none. ?     The Z-line was variable. Estimated blood loss: none. ?     Grade II varices were found in the distal esophagus. Two bands were  ?     successfully placed  with complete eradication, resulting in deflation of  ?     varices. There was no bleeding during the procedure. Estimated blood  ?     loss: none. ?     Esophagogastric landmarks were identified: the gastroesophageal junction  ?     was found at 37 cm from the incisors. ?     The exam of the esophagus was otherwise normal. ?Impression:            - Normal duodenal bulb, first portion of the duodenum  ?                       and second portion of the duodenum. ?                       - Portal hypertensive gastropathy. ?                       - Z-line variable. ?                       - Grade II esophageal varices. Completely eradicated.  ?                       Banded. ?                       - Esophagogastric landmarks identified. ?                       - No specimens collected. ?Recommendation:        - Discharge patient to home. ?                       - Resume previous diet, soft diet and low sodium diet  ?                       today. ?                       - Continue present medications. ?                       - Repeat upper endoscopy 2-3 weeks for retreatment. ?                       - Return  to GI office as previously scheduled. ?                       - The findings and recommendations were discussed with  ?                       the patient via video medical interpreter. ?                       - The findings and recommendations were discussed with  ?                       the patient's family (daughter on the phone). ?Procedure Code(s):     --- Professional --- ?                       [phone], Esophagogastroduodenoscopy, flexible,  ?                       transoral; with band ligation of esophageal/gastric  ?                       varices ?Diagnosis Code(s):     --- Professional --- ?                       K76.6, Portal hypertension ?                       K31.89, Other diseases of stomach and duodenum ?                       K22.8, Other specified diseases of esophagus ?                       I85.00,  Esophageal varices without bleeding ?CPT copyright 2019 American Medical Association. All rights reserved. ?The codes documented in this report are preliminary and upon coder review may  ?be revised to meet current compliance requirements. ?Attending Participation: ?     I personally performed the entire procedure. ?Volney American, DO ?Annamaria Helling DO, DO ?01/20/2022 11:21:16 AM ?This report has been signed electronically. ?Number of Addenda: 0 ?Note Initiated On: 01/20/2022 10:57 AM ?Estimated Blood Loss:  Estimated blood loss: none. ?     [facility] ?

## 2022-01-20 NOTE — Transfer of Care (Signed)
Immediate Anesthesia Transfer of Care Note ? ?Patient: Shawn Torres ? ?Procedure(s) Performed: ESOPHAGOGASTRODUODENOSCOPY (EGD) ? ?Patient Location: PACU ? ?Anesthesia Type:General ? ?Level of Consciousness: drowsy ? ?Airway & Oxygen Therapy: Patient Spontanous Breathing ? ?Post-op Assessment: Report given to RN and Post -op Vital signs reviewed and stable ? ?Post vital signs: Reviewed and stable ? ?Last Vitals:  ?Vitals Value Taken Time  ?BP 104/66 01/20/22 1120  ?Temp 35.8 ?C 01/20/22 1120  ?Pulse 64 01/20/22 1123  ?Resp 21 01/20/22 1123  ?SpO2 97 % 01/20/22 1123  ?Vitals shown include unvalidated device data. ? ?Last Pain:  ?Vitals:  ? 01/20/22 1120  ?TempSrc: Temporal  ?PainSc:   ?   ? ?  ? ?Complications: No notable events documented. ?

## 2022-01-21 ENCOUNTER — Encounter: Payer: Self-pay | Admitting: Gastroenterology

## 2022-02-02 ENCOUNTER — Encounter: Payer: Self-pay | Admitting: Gastroenterology

## 2022-02-02 NOTE — H&P (Signed)
? ?Pre-Procedure H&P ?  ?Patient ID: Shawn Torres is a 56 y.o. male. ? ?Gastroenterology Provider: Annamaria Helling, DO ? ?PCP: Caryl Never, MD ? ?Date: 02/03/2022 ? ?HPI ?[name] is a 56 y.o. male who presents today for Esophagogastroduodenoscopy for esophageal varices retreatment. ? ?Extended discussion with patient and daughter at bedside about recent frequent paracentesis and disease progression. Have reached out to [facility] hepatology whom he was previously following as well. ? ?Plan for EGD today. ? ?Taking beta-blocker-nadolol 20mg   ?  ?Last EGD performed on March 2nd demonstrating portal hypertensive gastropathy and to grade 2 esophageal varices that underwent banding with decompression. Varices have been improving with repeat EGDs. ?  ?Last INR was 1.6 platelet 95K creatinine 1.25, hgb  ? ?Paracentesis 3/14 with 10.5 L removed at [facility] and 10 L removed on 3/7 ?  ?Denies melena and hematochezia.  No hematemesis or coffee-ground emesis. ? ?Past Medical History:  ?Diagnosis Date  ? Ascites   ? Cirrhosis, alcoholic ([place])   ? Diabetes mellitus without complication ([place])   ? Memory loss   ? ? ?Past Surgical History:  ?Procedure Laterality Date  ? ESOPHAGOGASTRODUODENOSCOPY N/A 10/10/2021  ? Procedure: ESOPHAGOGASTRODUODENOSCOPY (EGD);  Surgeon: [name], MD;  Location: [facility] ENDOSCOPY;  Service: Endoscopy;  Laterality: N/A;  ? ESOPHAGOGASTRODUODENOSCOPY N/A 12/27/2021  ? Procedure: ESOPHAGOGASTRODUODENOSCOPY (EGD);  Surgeon: Annamaria Helling, DO;  Location: [facility] ENDOSCOPY;  Service: Gastroenterology;  Laterality: N/A;  Spanish Interpreter  ? ESOPHAGOGASTRODUODENOSCOPY N/A 01/20/2022  ? Procedure: ESOPHAGOGASTRODUODENOSCOPY (EGD);  Surgeon: Annamaria Helling, DO;  Location: [facility] ENDOSCOPY;  Service: Gastroenterology;  Laterality: N/A;  Spanish Interpreter  ? ESOPHAGOGASTRODUODENOSCOPY (EGD) WITH PROPOFOL N/A 12/11/2021  ? Procedure: ESOPHAGOGASTRODUODENOSCOPY (EGD) WITH PROPOFOL;   Surgeon: Annamaria Helling, DO;  Location: [facility] ENDOSCOPY;  Service: Gastroenterology;  Laterality: N/A;  ? ? ?Family History ?No h/o GI disease or malignancy ? ?Review of Systems  ?Constitutional:  Negative for activity change, appetite change, chills, diaphoresis, fatigue, fever and unexpected weight change.  ?HENT:  Negative for trouble swallowing and voice change.   ?Respiratory:  Negative for shortness of breath and wheezing.   ?Cardiovascular:  Negative for chest pain, palpitations and leg swelling.  ?Gastrointestinal:  Negative for abdominal distention, abdominal pain, anal bleeding, blood in stool, constipation, diarrhea, nausea and vomiting.  ?Musculoskeletal:  Negative for arthralgias and myalgias.  ?Skin:  Negative for color change and pallor.  ?Neurological:  Negative for dizziness, syncope and weakness.  ?Psychiatric/Behavioral:  Negative for confusion. The patient is not nervous/anxious.   ?All other systems reviewed and are negative.  ? ?Medications ?No current facility-administered medications on file prior to encounter.  ? ?Current Outpatient Medications on File Prior to Encounter  ?Medication Sig Dispense Refill  ? folic acid (FOLVITE) 1 MG tablet Take 1 tablet by mouth daily.    ? furosemide (LASIX) 40 MG tablet Take 1 tablet (40 mg total) by mouth daily. 30 tablet 2  ? lactulose (CHRONULAC) 10 GM/15ML solution Take 20 g by mouth every 4 (four) hours as needed for moderate constipation or severe constipation.    ? pantoprazole (PROTONIX) 40 MG tablet Take 1 tablet (40 mg total) by mouth 2 (two) times daily. 60 tablet 0  ? spironolactone (ALDACTONE) 100 MG tablet Take 1 tablet (100 mg total) by mouth daily. 30 tablet 2  ? tamsulosin (FLOMAX) 0.4 MG CAPS capsule Take 1 capsule (0.4 mg total) by mouth daily. 30 capsule 0  ? feeding supplement (ENSURE ENLIVE /  ENSURE PLUS) LIQD Take 237 mLs by mouth 3 (three) times daily between meals. 237 mL 12  ? Nutritional Supplements (,FEEDING SUPPLEMENT,  PROSOURCE PLUS) liquid Take 30 mLs by mouth 2 (two) times daily between meals. 887 mL 1  ? thiamine 100 MG tablet Take 1 tablet (100 mg total) by mouth daily. 30 tablet 0  ? ? ?Pertinent medications related to GI and procedure were reviewed by me with the patient prior to the procedure ? ? ?Current Facility-Administered Medications:  ?  0.9 %  sodium chloride infusion, , Intravenous, Continuous, Annamaria Helling, DO, Last Rate: 20 mL/hr at 02/03/22 1111, Continued from Pre-op at 02/03/22 1111 ?  ?  ? ?Allergies  ?Allergen Reactions  ? Albumin Gc Rash  ?  Patient gets rash with albumin. See media tab picture dated 12/24/21. Needs pre-meds w/ albumin (benadryl) to avoid allergic reaction.   ? ?Allergies were reviewed by me prior to the procedure ? ?Objective  ? ? ?Vitals:  ? 02/03/22 1047  ?BP: 120/71  ?Pulse: 79  ?Resp: 16  ?Temp: 97.6 ?F (36.4 ?C)  ?TempSrc: Temporal  ?SpO2: 100%  ?Weight: 78.5 kg  ?Height: 5\' 7"  (1.702 m)  ? ? ? ?Physical Exam ?Vitals and nursing note reviewed.  ?Constitutional:   ?   General: He is not in acute distress. ?   Appearance: Normal appearance. He is not ill-appearing, toxic-appearing or diaphoretic.  ?HENT:  ?   Head: Normocephalic and atraumatic.  ?   Nose: Nose normal.  ?   Mouth/Throat:  ?   Mouth: Mucous membranes are moist.  ?   Pharynx: Oropharynx is clear.  ?Eyes:  ?   General: No scleral icterus. ?   Extraocular Movements: Extraocular movements intact.  ?Cardiovascular:  ?   Rate and Rhythm: Normal rate and regular rhythm.  ?   Heart sounds: Normal heart sounds. No murmur heard. ?  No friction rub. No gallop.  ?Pulmonary:  ?   Effort: Pulmonary effort is normal. No respiratory distress.  ?   Breath sounds: Normal breath sounds. No wheezing, rhonchi or rales.  ?Abdominal:  ?   General: Bowel sounds are normal. There is distension.  ?   Palpations: Abdomen is soft.  ?   Tenderness: There is no abdominal tenderness. There is no guarding or rebound.  ?Musculoskeletal:  ?    Cervical back: Neck supple.  ?   Right lower leg: No edema.  ?   Left lower leg: No edema.  ?Skin: ?   General: Skin is warm and dry.  ?   Coloration: Skin is not jaundiced or pale.  ?Neurological:  ?   General: No focal deficit present.  ?   Mental Status: He is alert and oriented to person, place, and time. Mental status is at baseline.  ?   Comments: No asterixis  ?Psychiatric:     ?   Mood and Affect: Mood normal.     ?   Behavior: Behavior normal.     ?   Thought Content: Thought content normal.     ?   Judgment: Judgment normal.  ? ? ? ?Assessment:  ?Mr. Jadan Cubbage is a 56 y.o. male  who presents today for Esophagogastroduodenoscopy for esophageal varices retreatment. ? ?Plan:  ?Esophagogastroduodenoscopy with possible intervention today ? ?Esophagogastroduodenoscopy with possible biopsy, control of bleeding, polypectomy, and interventions as necessary has been discussed with the patient/patient representative. Informed consent was obtained from the patient/patient representative after explaining the indication, nature,  and risks of the procedure including but not limited to death, bleeding, perforation, missed neoplasm/lesions, cardiorespiratory compromise, and reaction to medications. Opportunity for questions was given and appropriate answers were provided. Patient/patient representative has verbalized understanding is amenable to undergoing the procedure. ? ? ?Annamaria Helling, DO  ?[facility] Gastroenterology ? ?Portions of the record may have been created with voice recognition software. Occasional wrong-word or 'sound-a-like' substitutions may have occurred due to the inherent limitations of voice recognition software.  Read the chart carefully and recognize, using context, where substitutions may have occurred. ?

## 2022-02-03 ENCOUNTER — Ambulatory Visit: Payer: Self-pay | Admitting: Anesthesiology

## 2022-02-03 ENCOUNTER — Ambulatory Visit
Admission: RE | Admit: 2022-02-03 | Discharge: 2022-02-03 | Disposition: A | Discharge status: Home / Self Care | Payer: Self-pay | Attending: Gastroenterology | Admitting: Gastroenterology

## 2022-02-03 ENCOUNTER — Encounter
Admission: RE | Disposition: A | Discharge status: Home / Self Care | Payer: Self-pay | Source: Home / Self Care | Attending: Gastroenterology

## 2022-02-03 ENCOUNTER — Encounter: Payer: Self-pay | Admitting: Gastroenterology

## 2022-02-03 DIAGNOSIS — K2210 Ulcer of esophagus without bleeding: Secondary | ICD-10-CM | POA: Insufficient documentation

## 2022-02-03 DIAGNOSIS — Z79899 Other long term (current) drug therapy: Secondary | ICD-10-CM | POA: Insufficient documentation

## 2022-02-03 DIAGNOSIS — I8510 Secondary esophageal varices without bleeding: Secondary | ICD-10-CM | POA: Insufficient documentation

## 2022-02-03 DIAGNOSIS — K7031 Alcoholic cirrhosis of liver with ascites: Secondary | ICD-10-CM | POA: Insufficient documentation

## 2022-02-03 DIAGNOSIS — K766 Portal hypertension: Secondary | ICD-10-CM | POA: Insufficient documentation

## 2022-02-03 DIAGNOSIS — K3189 Other diseases of stomach and duodenum: Secondary | ICD-10-CM | POA: Insufficient documentation

## 2022-02-03 HISTORY — PX: ESOPHAGOGASTRODUODENOSCOPY: SHX5428

## 2022-02-03 SURGERY — EGD (ESOPHAGOGASTRODUODENOSCOPY)
Anesthesia: General

## 2022-02-03 MED ORDER — PROPOFOL 500 MG/50ML IV EMUL
INTRAVENOUS | Status: DC | PRN
  Administered 2022-02-03: 150 ug/kg/min via INTRAVENOUS

## 2022-02-03 MED ORDER — LIDOCAINE HCL (CARDIAC) PF 100 MG/5ML IV SOSY
PREFILLED_SYRINGE | INTRAVENOUS | Status: DC | PRN
  Administered 2022-02-03: 50 mg via INTRAVENOUS

## 2022-02-03 MED ORDER — PROPOFOL 500 MG/50ML IV EMUL
INTRAVENOUS | Status: AC
  Filled 2022-02-03: qty 50

## 2022-02-03 MED ORDER — LIDOCAINE HCL (PF) 2 % IJ SOLN
INTRAMUSCULAR | Status: AC
  Filled 2022-02-03: qty 5

## 2022-02-03 MED ORDER — SODIUM CHLORIDE 0.9 % IV SOLN: INTRAVENOUS | Status: DC

## 2022-02-03 NOTE — Anesthesia Preprocedure Evaluation (Signed)
Anesthesia Evaluation  ?Patient identified by MRN, date of birth, ID band ?Patient awake ? ? ? ?Reviewed: ?Allergy & Precautions, H&P , NPO status , Patient's Chart, lab work & pertinent test results, reviewed documented beta blocker date and time  ? ?Airway ?Mallampati: II ? ? ?Neck ROM: full ? ? ? Dental ? ?(+) Poor Dentition ?  ?Pulmonary ?pneumonia, resolved,  ?  ?Pulmonary exam normal ? ? ? ? ? ? ? Cardiovascular ?Exercise Tolerance: Good ?negative cardio ROS ?Normal cardiovascular exam ?Rhythm:regular Rate:Normal ? ? ?  ?Neuro/Psych ?PSYCHIATRIC DISORDERS negative neurological ROS ?   ? GI/Hepatic ?Neg liver ROS, GERD  Medicated,  ?Endo/Other  ?negative endocrine ROSdiabetes ? Renal/GU ?Renal disease  ?negative genitourinary ?  ?Musculoskeletal ? ? Abdominal ?  ?Peds ? Hematology ? ?(+) Blood dyscrasia, anemia ,   ?Anesthesia Other Findings ?Past Medical History: ?No date: Ascites ?No date: Cirrhosis, alcoholic ([facility]) ?No date: Diabetes mellitus without complication ([facility]) ?No date: Memory loss ?Past Surgical History: ?10/10/2021: ESOPHAGOGASTRODUODENOSCOPY; N/A ?    Comment:  Procedure: ESOPHAGOGASTRODUODENOSCOPY (EGD);  Surgeon:  ?             Midge Minium, MD;  Location: [facility] ENDOSCOPY;  Service:  ?             Endoscopy;  Laterality: N/A; ?12/27/2021: ESOPHAGOGASTRODUODENOSCOPY; N/A ?    Comment:  Procedure: ESOPHAGOGASTRODUODENOSCOPY (EGD);  Surgeon:  ?             [name], DO;  Location: [facility] ENDOSCOPY;   ?             Service: Gastroenterology;  Laterality: N/A;  Spanish  ?             Interpreter ?01/20/2022: ESOPHAGOGASTRODUODENOSCOPY; N/A ?    Comment:  Procedure: ESOPHAGOGASTRODUODENOSCOPY (EGD);  Surgeon:  ?             [name], DO;  Location: [facility] ENDOSCOPY;   ?             Service: Gastroenterology;  Laterality: N/A;  Spanish  ?             Interpreter ?12/11/2021: ESOPHAGOGASTRODUODENOSCOPY (EGD) WITH PROPOFOL; N/A ?    Comment:  Procedure:  ESOPHAGOGASTRODUODENOSCOPY (EGD) WITH  ?             PROPOFOL;  Surgeon: [name], DO;  Location: ?             [facility] ENDOSCOPY;  Service: Gastroenterology;  Laterality:  ?             N/A; ? ? Reproductive/Obstetrics ?negative OB ROS ? ?  ? ? ? ? ? ? ? ? ? ? ? ? ? ?  ?  ? ? ? ? ? ? ? ? ?Anesthesia Physical ?Anesthesia Plan ? ?ASA: 3 ? ?Anesthesia Plan: General  ? ?Post-op Pain Management:   ? ?Induction:  ? ?PONV Risk Score and Plan:  ? ?Airway Management Planned:  ? ?Additional Equipment:  ? ?Intra-op Plan:  ? ?Post-operative Plan:  ? ?Informed Consent: I have reviewed the patients History and Physical, chart, labs and discussed the procedure including the risks, benefits and alternatives for the proposed anesthesia with the patient or authorized representative who has indicated his/her understanding and acceptance.  ? ? ? ?Dental Advisory Given ? ?Plan Discussed with: CRNA ? ?Anesthesia Plan Comments:   ? ? ? ? ? ? ?Anesthesia Quick Evaluation ? ?

## 2022-02-03 NOTE — Interval H&P Note (Signed)
History and Physical Interval Note: Preprocedure H&P from 02/03/22 ? was reviewed and there was no interval change after seeing and examining the patient.  Written consent was obtained from the patient after discussion of risks, benefits, and alternatives. Patient has consented to proceed with Esophagogastroduodenoscopy with possible intervention ? ? ?02/03/2022 ?11:14 AM ? ?Shawn Torres  has presented today for surgery, with the diagnosis of Esophageal varices without bleeding, unspecified esophageal varices type (CMS-HCC) (I85.00) ?Alcoholic cirrhosis of liver with ascites (CMS-HCC) (K70.31).  The various methods of treatment have been discussed with the patient and family. After consideration of risks, benefits and other options for treatment, the patient has consented to  Procedure(s) with comments: ?ESOPHAGOGASTRODUODENOSCOPY (EGD) (N/A) - Needs Spanish Interpreter - (prefers) all calls to daughter (she speaks [place]) as a surgical intervention.  The patient's history has been reviewed, patient examined, no change in status, stable for surgery.  I have reviewed the patient's chart and labs.  Questions were answered to the patient's satisfaction.   ? ? ?[name] ? ? ?

## 2022-02-03 NOTE — Transfer of Care (Signed)
Immediate Anesthesia Transfer of Care Note ? ?Patient: Shawn Torres ? ?Procedure(s) Performed: ESOPHAGOGASTRODUODENOSCOPY (EGD) ? ?Patient Location: PACU ? ?Anesthesia Type:General ? ?Level of Consciousness: awake and sedated ? ?Airway & Oxygen Therapy: Patient Spontanous Breathing and Patient connected to nasal cannula oxygen ? ?Post-op Assessment: Report given to RN and Post -op Vital signs reviewed and stable ? ?Post vital signs: Reviewed and stable ? ?Last Vitals:  ?Vitals Value Taken Time  ?BP    ?Temp    ?Pulse    ?Resp    ?SpO2    ? ? ?Last Pain:  ?Vitals:  ? 02/03/22 1047  ?TempSrc: Temporal  ?PainSc: 0-No pain  ?   ? ?  ? ?Complications: No notable events documented. ?

## 2022-02-03 NOTE — Op Note (Signed)
[facility] ?Gastroenterology ?Patient Name: Shawn Torres ?Procedure Date: 02/03/2022 10:48 AM ?MRN: [id number] ?Account #: [id number] ?Date of Birth: [birth year] ?Admit Type: Outpatient ?Age: 56 ?Room: [facility] ENDO ROOM 1 ?Gender: Male ?Note Status: Finalized ?Instrument Name: Upper Endoscope [id number] ?Procedure:             Upper GI endoscopy ?Indications:           Follow-up of esophageal varices ?Providers:             Annamaria Helling DO, DO ?Referring MD:          No Local Md, MD (Referring MD) ?Medicines:             Monitored Anesthesia Care ?Complications:         No immediate complications. Estimated blood loss: None. ?Procedure:             Pre-Anesthesia Assessment: ?                       - Prior to the procedure, a History and Physical was  ?                       performed, and patient medications and allergies were  ?                       reviewed. The patient is competent. The risks and  ?                       benefits of the procedure and the sedation options and  ?                       risks were discussed with the patient. All questions  ?                       were answered and informed consent was obtained.  ?                       Patient identification and proposed procedure were  ?                       verified by the physician, the nurse, the anesthetist  ?                       and the technician in the endoscopy suite. Mental  ?                       Status Examination: alert and oriented. Airway  ?                       Examination: normal oropharyngeal airway and neck  ?                       mobility. Respiratory Examination: clear to  ?                       auscultation. CV Examination: RRR, no murmurs, no S3  ?                       or S4. Prophylactic Antibiotics: The patient does not  ?  require prophylactic antibiotics. Prior  ?                       Anticoagulants: The patient has taken no previous  ?                       anticoagulant or  antiplatelet agents. ASA Grade  ?                       Assessment: III - A patient with severe systemic  ?                       disease. After reviewing the risks and benefits, the  ?                       patient was deemed in satisfactory condition to  ?                       undergo the procedure. The anesthesia plan was to use  ?                       monitored anesthesia care (MAC). Immediately prior to  ?                       administration of medications, the patient was  ?                       re-assessed for adequacy to receive sedatives. The  ?                       heart rate, respiratory rate, oxygen saturations,  ?                       blood pressure, adequacy of pulmonary ventilation, and  ?                       response to care were monitored throughout the  ?                       procedure. The physical status of the patient was  ?                       re-assessed after the procedure. ?                       After obtaining informed consent, the endoscope was  ?                       passed under direct vision. Throughout the procedure,  ?                       the patient's blood pressure, pulse, and oxygen  ?                       saturations were monitored continuously. The Endoscope  ?                       was introduced through the mouth, and advanced to the  ?  second part of duodenum. The upper GI endoscopy was  ?                       accomplished without difficulty. The patient tolerated  ?                       the procedure well. ?Findings: ?     The duodenal bulb, first portion of the duodenum and second portion of  ?     the duodenum were normal. Estimated blood loss: none. ?     Mild portal hypertensive gastropathy was found in the entire examined  ?     stomach. no bleeding. Estimated blood loss: none. ?     Esophagogastric landmarks were identified: the gastroesophageal junction  ?     was found at 35 cm from the incisors. ?     The Z-line was regular.  Estimated blood loss: none. ?     Grade II varices were found in the distal esophagus. They were Reduced  ?     in size from previous. No bands were placed this time. Estimated blood  ?     loss: none. ?     One superficial esophageal ulcer with no bleeding and no stigmata of  ?     recent bleeding was found. The lesion was 3 mm in largest dimension.  ?     Suspect from previous banding site. Estimated blood loss: none. ?Impression:            - Normal duodenal bulb, first portion of the duodenum  ?                       and second portion of the duodenum. ?                       - Portal hypertensive gastropathy. ?                       - Esophagogastric landmarks identified. ?                       - Z-line regular. ?                       - Grade II esophageal varices. ?                       - Esophageal ulcer with no bleeding and no stigmata of  ?                       recent bleeding. ?                       - No specimens collected. ?Recommendation:        - Discharge patient to home. ?                       - Resume previous diet. ?                       - Continue present medications. ?                       - Return to GI clinic as previously  scheduled. ?Procedure Code(s):     --- Professional --- ?                       [phone], Esophagogastroduodenoscopy, flexible,  ?                       transoral; diagnostic, including collection of  ?                       specimen(s) by brushing or washing, when performed  ?                       (separate procedure) ?Diagnosis Code(s):     --- Professional --- ?                       K76.6, Portal hypertension ?                       K31.89, Other diseases of stomach and duodenum ?                       I85.00, Esophageal varices without bleeding ?                       K22.10, Ulcer of esophagus without bleeding ?CPT copyright 2019 [employer]. All rights reserved. ?The codes documented in this report are preliminary and upon coder review may  ?be  revised to meet current compliance requirements. ?Attending Participation: ?     I personally performed the entire procedure. ?Volney American, DO ?Annamaria Helling DO, DO ?02/03/2022 11:39:28 AM ?This report has been signed electronically. ?Number of Addenda: 0 ?Note Initiated On: 02/03/2022 10:48 AM ?Estimated Blood Loss:  Estimated blood loss: none. ?     [facility] ?

## 2022-02-03 NOTE — Anesthesia Procedure Notes (Signed)
Date/Time: 02/03/2022 11:15 AM ?Performed by: Vaughan Sine ?Pre-anesthesia Checklist: Patient identified, Emergency Drugs available, Suction available, Patient being monitored and Timeout performed ?Patient Re-evaluated:Patient Re-evaluated prior to induction ?Oxygen Delivery Method: Nasal cannula ?Preoxygenation: Pre-oxygenation with 100% oxygen ?Induction Type: IV induction ?Airway Equipment and Method: Bite block ?Placement Confirmation: CO2 detector and positive ETCO2 ? ? ? ? ?

## 2022-02-04 ENCOUNTER — Encounter: Payer: Self-pay | Admitting: Gastroenterology

## 2022-02-09 NOTE — Anesthesia Postprocedure Evaluation (Signed)
Anesthesia Post Note ? ?Patient: Taner Rzepka ? ?Procedure(s) Performed: ESOPHAGOGASTRODUODENOSCOPY (EGD) ? ?Patient location during evaluation: PACU ?Anesthesia Type: General ?Level of consciousness: awake and alert ?Pain management: pain level controlled ?Vital Signs Assessment: post-procedure vital signs reviewed and stable ?Respiratory status: spontaneous breathing, nonlabored ventilation, respiratory function stable and patient connected to nasal cannula oxygen ?Cardiovascular status: blood pressure returned to baseline and stable ?Postop Assessment: no apparent nausea or vomiting ?Anesthetic complications: no ? ? ?No notable events documented. ? ? ?Last Vitals:  ?Vitals:  ? 02/03/22 1143 02/03/22 1153  ?BP: 115/78 120/83  ?Pulse:  79  ?Resp:  16  ?Temp:    ?SpO2:  100%  ?  ?Last Pain:  ?Vitals:  ? 02/03/22 1153  ?TempSrc:   ?PainSc: 0-No pain  ? ? ?  ?  ?  ?  ?  ?  ? ?Yevette Edwards ? ? ? ? ?

## 2022-02-13 ENCOUNTER — Inpatient Hospital Stay
Admission: EM | Admit: 2022-02-13 | Discharge: 2022-02-17 | DRG: 441 | Disposition: A | Discharge status: Home / Self Care | Payer: Self-pay | Attending: Internal Medicine | Admitting: Internal Medicine

## 2022-02-13 ENCOUNTER — Emergency Department: Payer: Self-pay

## 2022-02-13 ENCOUNTER — Inpatient Hospital Stay: Payer: Self-pay

## 2022-02-13 ENCOUNTER — Encounter: Payer: Self-pay | Admitting: Radiology

## 2022-02-13 DIAGNOSIS — K7031 Alcoholic cirrhosis of liver with ascites: Secondary | ICD-10-CM | POA: Diagnosis present

## 2022-02-13 DIAGNOSIS — E7220 Disorder of urea cycle metabolism, unspecified: Secondary | ICD-10-CM | POA: Diagnosis present

## 2022-02-13 DIAGNOSIS — Z20822 Contact with and (suspected) exposure to covid-19: Secondary | ICD-10-CM | POA: Diagnosis present

## 2022-02-13 DIAGNOSIS — K766 Portal hypertension: Secondary | ICD-10-CM | POA: Diagnosis present

## 2022-02-13 DIAGNOSIS — I8510 Secondary esophageal varices without bleeding: Secondary | ICD-10-CM | POA: Diagnosis present

## 2022-02-13 DIAGNOSIS — E440 Moderate protein-calorie malnutrition: Secondary | ICD-10-CM | POA: Diagnosis present

## 2022-02-13 DIAGNOSIS — N179 Acute kidney failure, unspecified: Secondary | ICD-10-CM | POA: Diagnosis present

## 2022-02-13 DIAGNOSIS — J9601 Acute respiratory failure with hypoxia: Secondary | ICD-10-CM | POA: Diagnosis present

## 2022-02-13 DIAGNOSIS — W19XXXA Unspecified fall, initial encounter: Secondary | ICD-10-CM | POA: Diagnosis present

## 2022-02-13 DIAGNOSIS — M79601 Pain in right arm: Secondary | ICD-10-CM | POA: Diagnosis present

## 2022-02-13 DIAGNOSIS — R4182 Altered mental status, unspecified: Secondary | ICD-10-CM

## 2022-02-13 DIAGNOSIS — K7211 Chronic hepatic failure with coma: Secondary | ICD-10-CM

## 2022-02-13 DIAGNOSIS — I81 Portal vein thrombosis: Secondary | ICD-10-CM | POA: Diagnosis present

## 2022-02-13 DIAGNOSIS — J9600 Acute respiratory failure, unspecified whether with hypoxia or hypercapnia: Secondary | ICD-10-CM

## 2022-02-13 DIAGNOSIS — N1831 Chronic kidney disease, stage 3a: Secondary | ICD-10-CM | POA: Diagnosis present

## 2022-02-13 DIAGNOSIS — E872 Acidosis, unspecified: Secondary | ICD-10-CM | POA: Diagnosis present

## 2022-02-13 DIAGNOSIS — E1122 Type 2 diabetes mellitus with diabetic chronic kidney disease: Secondary | ICD-10-CM | POA: Diagnosis present

## 2022-02-13 DIAGNOSIS — E876 Hypokalemia: Secondary | ICD-10-CM | POA: Diagnosis present

## 2022-02-13 DIAGNOSIS — R680 Hypothermia, not associated with low environmental temperature: Secondary | ICD-10-CM | POA: Diagnosis present

## 2022-02-13 DIAGNOSIS — K7682 Hepatic encephalopathy: Principal | ICD-10-CM | POA: Diagnosis present

## 2022-02-13 DIAGNOSIS — Z6828 Body mass index (BMI) 28.0-28.9, adult: Secondary | ICD-10-CM

## 2022-02-13 LAB — GLUCOSE, CAPILLARY
Glucose-Capillary: 185 mg/dL — ABNORMAL HIGH (ref 70–99)
Glucose-Capillary: 136 mg/dL — ABNORMAL HIGH (ref 70–99)
Glucose-Capillary: 84 mg/dL (ref 70–99)
Glucose-Capillary: 109 mg/dL — ABNORMAL HIGH (ref 70–99)

## 2022-02-13 LAB — URINE DRUG SCREEN, QUALITATIVE (ARMC ONLY)
Amphetamines, Ur Screen: NOT DETECTED
Barbiturates, Ur Screen: NOT DETECTED
Benzodiazepine, Ur Scrn: NOT DETECTED
Cannabinoid 50 Ng, Ur ~~LOC~~: NOT DETECTED
Cocaine Metabolite,Ur ~~LOC~~: NOT DETECTED
MDMA (Ecstasy)Ur Screen: NOT DETECTED
Methadone Scn, Ur: NOT DETECTED
Opiate, Ur Screen: NOT DETECTED
Phencyclidine (PCP) Ur S: NOT DETECTED
Tricyclic, Ur Screen: NOT DETECTED

## 2022-02-13 LAB — LACTIC ACID, PLASMA
Lactic Acid, Venous: 4.1 mmol/L (ref 0.5–1.9)
Lactic Acid, Venous: 3.6 mmol/L (ref 0.5–1.9)

## 2022-02-13 LAB — BLOOD GAS, ARTERIAL
Acid-base deficit: 7.1 mmol/L — ABNORMAL HIGH (ref 0.0–2.0)
Bicarbonate: 17.9 mmol/L — ABNORMAL LOW (ref 20.0–28.0)
FIO2: 50 %
MECHVT: 450 mL
O2 Saturation: 100 %
PEEP: 5 cmH2O
Patient temperature: 37
RATE: 16 resp/min
pCO2 arterial: 34 mmHg (ref 32–48)
pH, Arterial: 7.33 — ABNORMAL LOW (ref 7.35–7.45)
pO2, Arterial: 160 mmHg — ABNORMAL HIGH (ref 83–108)

## 2022-02-13 LAB — CBC WITH DIFFERENTIAL/PLATELET
Abs Immature Granulocytes: 0.03 10*3/uL (ref 0.00–0.07)
Basophils Absolute: 0.1 10*3/uL (ref 0.0–0.1)
Basophils Relative: 1 %
Eosinophils Absolute: 0.3 10*3/uL (ref 0.0–0.5)
Eosinophils Relative: 4 %
HCT: 25 % — ABNORMAL LOW (ref 39.0–52.0)
Hemoglobin: 8.7 g/dL — ABNORMAL LOW (ref 13.0–17.0)
Immature Granulocytes: 1 %
Lymphocytes Relative: 26 %
Lymphs Abs: 1.5 10*3/uL (ref 0.7–4.0)
MCH: 33.2 pg (ref 26.0–34.0)
MCHC: 34.8 g/dL (ref 30.0–36.0)
MCV: 95.4 fL (ref 80.0–100.0)
Monocytes Absolute: 0.7 10*3/uL (ref 0.1–1.0)
Monocytes Relative: 11 %
Neutro Abs: 3.4 10*3/uL (ref 1.7–7.7)
Neutrophils Relative %: 57 %
Platelets: 137 10*3/uL — ABNORMAL LOW (ref 150–400)
RBC: 2.62 MIL/uL — ABNORMAL LOW (ref 4.22–5.81)
RDW: 16.6 % — ABNORMAL HIGH (ref 11.5–15.5)
WBC: 5.9 10*3/uL (ref 4.0–10.5)
nRBC: 0 % (ref 0.0–0.2)

## 2022-02-13 LAB — URINALYSIS, ROUTINE W REFLEX MICROSCOPIC
Bacteria, UA: NONE SEEN
Bilirubin Urine: NEGATIVE
Glucose, UA: NEGATIVE mg/dL
Ketones, ur: NEGATIVE mg/dL
Leukocytes,Ua: NEGATIVE
Nitrite: NEGATIVE
Protein, ur: 30 mg/dL — AB
Specific Gravity, Urine: 1.013 (ref 1.005–1.030)
pH: 5 (ref 5.0–8.0)

## 2022-02-13 LAB — MAGNESIUM: Magnesium: 1.9 mg/dL (ref 1.7–2.4)

## 2022-02-13 LAB — ETHANOL: Alcohol, Ethyl (B): <10 mg/dL (ref <10)

## 2022-02-13 LAB — COMPREHENSIVE METABOLIC PANEL
ALT: 17 U/L (ref 0–44)
AST: 38 U/L (ref 15–41)
Albumin: 3 g/dL — ABNORMAL LOW (ref 3.5–5.0)
Alkaline Phosphatase: 155 U/L — ABNORMAL HIGH (ref 38–126)
Anion gap: 4 — ABNORMAL LOW (ref 5–15)
BUN: 23 mg/dL — ABNORMAL HIGH (ref 6–20)
CO2: 20 mmol/L — ABNORMAL LOW (ref 22–32)
Calcium: 8.2 mg/dL — ABNORMAL LOW (ref 8.9–10.3)
Chloride: 113 mmol/L — ABNORMAL HIGH (ref 98–111)
Creatinine, Ser: 1.19 mg/dL (ref 0.61–1.24)
GFR, Estimated: >60 mL/min (ref >60)
Glucose, Bld: 159 mg/dL — ABNORMAL HIGH (ref 70–99)
Potassium: 4.3 mmol/L (ref 3.5–5.1)
Sodium: 137 mmol/L (ref 135–145)
Total Bilirubin: 2.2 mg/dL — ABNORMAL HIGH (ref 0.3–1.2)
Total Protein: 6.2 g/dL — ABNORMAL LOW (ref 6.5–8.1)

## 2022-02-13 LAB — AMMONIA
Ammonia: 267 umol/L — ABNORMAL HIGH (ref 9–35)
Ammonia: 132 umol/L — ABNORMAL HIGH (ref 9–35)

## 2022-02-13 LAB — PROTIME-INR
INR: 1.8 — ABNORMAL HIGH (ref 0.8–1.2)
Prothrombin Time: 21.2 seconds — ABNORMAL HIGH (ref 11.4–15.2)

## 2022-02-13 LAB — APTT: aPTT: 42 seconds — ABNORMAL HIGH (ref 24–36)

## 2022-02-13 LAB — RESP PANEL BY RT-PCR (FLU A&B, COVID) ARPGX2
Influenza A by PCR: NEGATIVE
Influenza B by PCR: NEGATIVE
SARS Coronavirus 2 by RT PCR: NEGATIVE

## 2022-02-13 LAB — LIPASE, BLOOD: Lipase: 41 U/L (ref 11–51)

## 2022-02-13 LAB — TYPE AND SCREEN
ABO/RH(D): O POS
Antibody Screen: NEGATIVE

## 2022-02-13 LAB — PROCALCITONIN: Procalcitonin: <0.1 ng/mL

## 2022-02-13 LAB — MRSA NEXT GEN BY PCR, NASAL: MRSA by PCR Next Gen: NOT DETECTED

## 2022-02-13 LAB — CBG MONITORING, ED: Glucose-Capillary: 166 mg/dL — ABNORMAL HIGH (ref 70–99)

## 2022-02-13 LAB — PHOSPHORUS: Phosphorus: 3.5 mg/dL (ref 2.5–4.6)

## 2022-02-13 MED ORDER — CHLORHEXIDINE GLUCONATE 0.12% ORAL RINSE (MEDLINE KIT)
15.0000 mL | Freq: Two times a day (BID) | OROMUCOSAL | Status: DC
  Administered 2022-02-13 – 2022-02-17 (×8): 15 mL via OROMUCOSAL

## 2022-02-13 MED ORDER — DOCUSATE SODIUM 50 MG/5ML PO LIQD
100.0000 mg | Freq: Two times a day (BID) | ORAL | Status: DC
  Administered 2022-02-13 – 2022-02-15 (×4): 100 mg
  Filled 2022-02-13 (×6): qty 10

## 2022-02-13 MED ORDER — INSULIN ASPART 100 UNIT/ML IJ SOLN
0.0000 [IU] | INTRAMUSCULAR | Status: DC
  Administered 2022-02-13: 1 [IU] via SUBCUTANEOUS
  Administered 2022-02-13: 2 [IU] via SUBCUTANEOUS
  Administered 2022-02-15: 1 [IU] via SUBCUTANEOUS
  Administered 2022-02-15: 2 [IU] via SUBCUTANEOUS
  Administered 2022-02-15: 1 [IU] via SUBCUTANEOUS
  Administered 2022-02-15: 2 [IU] via SUBCUTANEOUS
  Filled 2022-02-13 (×5): qty 1

## 2022-02-13 MED ORDER — ADULT MULTIVITAMIN W/MINERALS CH
1.0000 | ORAL_TABLET | Freq: Every day | ORAL | Status: DC
  Administered 2022-02-13 – 2022-02-17 (×5): 1
  Filled 2022-02-13 (×5): qty 1

## 2022-02-13 MED ORDER — CHLORHEXIDINE GLUCONATE CLOTH 2 % EX PADS
6.0000 | MEDICATED_PAD | Freq: Every day | CUTANEOUS | Status: DC
  Administered 2022-02-13 – 2022-02-17 (×4): 6 via TOPICAL

## 2022-02-13 MED ORDER — SODIUM CHLORIDE 0.9 % IV SOLN
3.0000 g | Freq: Four times a day (QID) | INTRAVENOUS | Status: DC
  Administered 2022-02-13: 3 g via INTRAVENOUS
  Filled 2022-02-13: qty 8

## 2022-02-13 MED ORDER — ROCURONIUM BROMIDE 10 MG/ML (PF) SYRINGE
PREFILLED_SYRINGE | INTRAVENOUS | Status: AC
Start: 2022-02-13 — End: 2022-02-13
  Administered 2022-02-13: 100 mg via INTRAVENOUS
  Filled 2022-02-13: qty 20

## 2022-02-13 MED ORDER — LACTATED RINGERS IV BOLUS
1000.0000 mL | Freq: Once | INTRAVENOUS | Status: AC
  Administered 2022-02-13: 1000 mL via INTRAVENOUS

## 2022-02-13 MED ORDER — ETOMIDATE 2 MG/ML IV SOLN
INTRAVENOUS | Status: AC
  Administered 2022-02-13: 30 mg via INTRAVENOUS
  Filled 2022-02-13: qty 10

## 2022-02-13 MED ORDER — NALOXONE HCL 2 MG/2ML IJ SOSY
0.5000 mg | PREFILLED_SYRINGE | Freq: Once | INTRAMUSCULAR | Status: AC
  Administered 2022-02-13: 0.5 mg via INTRAVENOUS

## 2022-02-13 MED ORDER — LACTULOSE 10 GM/15ML PO SOLN: 30.0000 g | Freq: Three times a day (TID) | ORAL | Status: DC

## 2022-02-13 MED ORDER — PROPOFOL 1000 MG/100ML IV EMUL
5.0000 ug/kg/min | INTRAVENOUS | Status: DC
  Administered 2022-02-13: 5 ug/kg/min via INTRAVENOUS
  Administered 2022-02-13: 30 ug/kg/min via INTRAVENOUS
  Administered 2022-02-13: 25 ug/kg/min via INTRAVENOUS
  Administered 2022-02-13: 20 ug/kg/min via INTRAVENOUS
  Administered 2022-02-14: 40 ug/kg/min via INTRAVENOUS
  Administered 2022-02-14: 10 ug/kg/min via INTRAVENOUS
  Administered 2022-02-15: 30 ug/kg/min via INTRAVENOUS
  Filled 2022-02-13 (×8): qty 100

## 2022-02-13 MED ORDER — LACTULOSE 10 GM/15ML PO SOLN
30.0000 g | ORAL | Status: DC
  Administered 2022-02-13 – 2022-02-15 (×11): 30 g
  Filled 2022-02-13 (×10): qty 60

## 2022-02-13 MED ORDER — POLYETHYLENE GLYCOL 3350 17 G PO PACK
17.0000 g | PACK | Freq: Every day | ORAL | Status: DC | PRN
  Administered 2022-02-13: 17 g

## 2022-02-13 MED ORDER — FENTANYL 2500MCG IN NS 250ML (10MCG/ML) PREMIX INFUSION
0.0000 ug/h | INTRAVENOUS | Status: DC
  Administered 2022-02-13: 100 ug/h via INTRAVENOUS
  Filled 2022-02-13: qty 250

## 2022-02-13 MED ORDER — LACTULOSE 10 GM/15ML PO SOLN
30.0000 g | Freq: Once | ORAL | Status: AC
  Administered 2022-02-13: 30 g
  Filled 2022-02-13: qty 60

## 2022-02-13 MED ORDER — VITAL HIGH PROTEIN PO LIQD: 1000.0000 mL | ORAL | Status: DC

## 2022-02-13 MED ORDER — FOLIC ACID 1 MG PO TABS
1.0000 mg | ORAL_TABLET | Freq: Every day | ORAL | Status: DC
  Administered 2022-02-13 – 2022-02-15 (×3): 1 mg
  Filled 2022-02-13 (×3): qty 1

## 2022-02-13 MED ORDER — ROCURONIUM BROMIDE 50 MG/5ML IV SOLN: 100.0000 mg | Freq: Once | INTRAVENOUS | Status: AC

## 2022-02-13 MED ORDER — DOCUSATE SODIUM 50 MG/5ML PO LIQD
100.0000 mg | Freq: Two times a day (BID) | ORAL | Status: DC | PRN
  Administered 2022-02-13: 100 mg
  Filled 2022-02-13: qty 10

## 2022-02-13 MED ORDER — HEPARIN SODIUM (PORCINE) 5000 UNIT/ML IJ SOLN
5000.0000 [IU] | Freq: Three times a day (TID) | INTRAMUSCULAR | Status: DC
  Administered 2022-02-13 – 2022-02-17 (×12): 5000 [IU] via SUBCUTANEOUS
  Filled 2022-02-13 (×11): qty 1

## 2022-02-13 MED ORDER — VITAL HIGH PROTEIN PO LIQD
1000.0000 mL | ORAL | Status: DC
  Administered 2022-02-13: 1000 mL

## 2022-02-13 MED ORDER — POLYETHYLENE GLYCOL 3350 17 G PO PACK
17.0000 g | PACK | Freq: Every day | ORAL | Status: DC
  Administered 2022-02-15: 17 g
  Filled 2022-02-13 (×2): qty 1

## 2022-02-13 MED ORDER — THIAMINE HCL 100 MG PO TABS
100.0000 mg | ORAL_TABLET | Freq: Every day | ORAL | Status: DC
  Administered 2022-02-13 – 2022-02-15 (×3): 100 mg
  Filled 2022-02-13 (×3): qty 1

## 2022-02-13 MED ORDER — ETOMIDATE 2 MG/ML IV SOLN: 30.0000 mg | Freq: Once | INTRAVENOUS | Status: AC

## 2022-02-13 MED ORDER — FAMOTIDINE IN NACL 20-0.9 MG/50ML-% IV SOLN: 20.0000 mg | Freq: Two times a day (BID) | INTRAVENOUS | Status: DC

## 2022-02-13 MED ORDER — SODIUM CHLORIDE 0.9 % IV SOLN
2.0000 g | INTRAVENOUS | Status: DC
  Administered 2022-02-13 – 2022-02-17 (×5): 2 g via INTRAVENOUS
  Filled 2022-02-13 (×3): qty 2
  Filled 2022-02-13: qty 20
  Filled 2022-02-13: qty 2

## 2022-02-13 MED ORDER — ORAL CARE MOUTH RINSE
15.0000 mL | OROMUCOSAL | Status: DC
  Administered 2022-02-13 – 2022-02-15 (×21): 15 mL via OROMUCOSAL

## 2022-02-13 MED ORDER — PANTOPRAZOLE 2 MG/ML SUSPENSION
40.0000 mg | Freq: Two times a day (BID) | ORAL | Status: DC
  Administered 2022-02-13 – 2022-02-15 (×5): 40 mg
  Filled 2022-02-13 (×6): qty 20

## 2022-02-13 NOTE — Consult Note (Signed)
Pharmacy Antibiotic Note ? ?Shawn Torres is a 56 y.o. male admitted on 02/13/2022 with  aspiration pneumonia .  Pharmacy has been consulted for Unasyn dosing. ? ?Plan: ?Unasyn 3g IV Q 6 hours ? ?Weight: 84.6 kg (186 lb 8.2 oz) ? ?Temp (24hrs), Avg:96.1 ?F (35.6 ?C), Min:95.7 ?F (35.4 ?C), Max:96.5 ?F (35.8 ?C) ? ?Recent Labs  ?Lab 02/13/22 ?0351 02/13/22 ?3149  ?WBC 5.9  --   ?CREATININE 1.19  --   ?LATICACIDVEN 4.1* 3.6*  ?  ?Estimated Creatinine Clearance: 74.6 mL/min (by C-G formula based on SCr of 1.19 mg/dL).   ? ?Allergies  ?Allergen Reactions  ? Albumin Gc Rash  ?  Patient gets rash with albumin. See media tab picture dated 12/24/21. Needs pre-meds w/ albumin (benadryl) to avoid allergic reaction.   ? ? ?Antimicrobials this admission: ?Unasyn 3/26 >>  ? ?Dose adjustments this admission: ?N/A ? ?Thank you for allowing pharmacy to be a part of this patient?s care. ? ?Bettey Costa ?02/13/2022 7:11 AM ? ?

## 2022-02-13 NOTE — Consult Note (Signed)
PHARMACY CONSULT NOTE - FOLLOW UP ? ?Pharmacy Consult for Electrolyte Monitoring and Replacement  ? ?Recent Labs: ?Potassium (mmol/L)  ?Date Value  ?02/13/2022 4.3  ? ?Magnesium (mg/dL)  ?Date Value  ?02/13/2022 1.9  ? ?Calcium (mg/dL)  ?Date Value  ?02/13/2022 8.2 (L)  ? ?Albumin (g/dL)  ?Date Value  ?02/13/2022 3.0 (L)  ? ?Phosphorus (mg/dL)  ?Date Value  ?02/13/2022 3.5  ? ?Sodium (mmol/L)  ?Date Value  ?02/13/2022 137  ? ?Corrected Calcium: 9.0 ? ?Assessment: ?Pharmacy has been consulted to monitor and replace electrolytes in 55yo patient found unresponsive at home. He was placed on a mechanical ventilator s/t hepatic encephalopathy(ammonia level of 267 upon admission). Patient received 2L lactated ringers bolus in ED. Admitted to ICU. ? ?IVF: none currently ? ?Goal of Therapy:  ?Electrolytes WNL ? ?Plan:  ?No current supplementation needed ?Will follow up with AM labs ? ?Bettey Costa ,PharmD ?Clinical Pharmacist ?02/13/2022 6:52 AM ? ?

## 2022-02-13 NOTE — ED Notes (Signed)
Pt placed on bairhugger at this time.  

## 2022-02-13 NOTE — ED Provider Notes (Signed)
? ?[facility] ?Provider Note ? ? ? Event Date/Time  ? First MD Initiated Contact with Patient 02/13/22 [phone]   ?  (approximate) ? ? ?History  ? ?Respiratory Distress and unresponsive ? ? ?HPI ?Level 5 caveat:  history/ROS limited by acute/critical illness ? ? ?Shawn Torres is a 56 y.o. 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  He presents by EMS as emergency traffic.  Reportedly his family found him down and unresponsive in the bathroom.  No other history was provided.  No other known recent illness.  He does not have access to alcohol and they do not believe he has been drinking. ? ?He is responsive to painful stimuli but his breathing is slow and somewhat irregular.  EMS reports that his heart rate varied between 20 and 80 beats a minute, although he has been steady in the 70s since arriving to the emergency department. ?  ? ? ?Physical Exam  ? ?Triage Vital Signs: ?ED Triage Vitals  ?Enc Vitals Group  ?   BP 02/13/22 0349 126/88  ?   Pulse Rate 02/13/22 0345 76  ?   Resp 02/13/22 0345 18  ?   Temp --   ?   Temp src --   ?   SpO2 02/13/22 0344 100 %  ?   Weight 02/13/22 0350 84.6 kg (186 lb 8.2 oz)  ?   Height --   ?   Head Circumference --   ?   Peak Flow --   ?   Pain Score --   ?   Pain Loc --   ?   Pain Edu? --   ?   Excl. in GC? --   ? ? ?Most recent vital signs: ?Vitals:  ? 02/13/22 0630 02/13/22 0700  ?BP: (!) 160/85 (!) 159/83  ?Pulse: 78 83  ?Resp: (!) 27 (!) 22  ?Temp: (!) 95.9 ?F (35.5 ?C) (!) 96.3 ?F (35.7 ?C)  ?SpO2: 100% 100%  ? ? ? ?General: Stuporous.  Patient will not follow commands. ?CV:  Good peripheral perfusion.  Normal heart sounds. ?Resp:  When the patient initially arrived, he was breathing slower than usual, perhaps 15 respirations per minute.  Lungs are clear to auscultation. ?Abd:  Distended abdomen  consistent with chronic ascites.  No reaction to palpation.  Gauze in place and right lower quadrant consistent with recent thoracentesis. ?Other:  GCS of 7 (eye-opening 1, verbal response 1, pain localization 5).  He would localize to painful stimuli but otherwise is minimally responsive.  Concern for airway protection. ? ? ?ED Results / Procedures / Treatments  ? ?Labs ?(all labs ordered are listed, but only abnormal results are displayed) ?Labs Reviewed  ?AMMONIA - Abnormal; Notable for the following components:  ?    Result Value  ? Ammonia 267 (*)   ? All other components within normal limits  ?COMPREHENSIVE METABOLIC PANEL - Abnormal; Notable for the following components:  ? Chloride 113 (*)   ? CO2 20 (*)   ? Glucose, Bld 159 (*)   ? BUN 23 (*)   ? Calcium 8.2 (*)   ? Total Protein 6.2 (*)   ? Albumin 3.0 (*)   ? Alkaline Phosphatase 155 (*)   ? Total Bilirubin 2.2 (*)   ? Anion gap 4 (*)   ? All other  components within normal limits  ?CBC WITH DIFFERENTIAL/PLATELET - Abnormal; Notable for the following components:  ? RBC 2.62 (*)   ? Hemoglobin 8.7 (*)   ? HCT 25.0 (*)   ? RDW 16.6 (*)   ? Platelets 137 (*)   ? All other components within normal limits  ?PROTIME-INR - Abnormal; Notable for the following components:  ? Prothrombin Time 21.2 (*)   ? INR 1.8 (*)   ? All other components within normal limits  ?APTT - Abnormal; Notable for the following components:  ? aPTT 42 (*)   ? All other components within normal limits  ?URINALYSIS, ROUTINE W REFLEX MICROSCOPIC - Abnormal; Notable for the following components:  ? Color, Urine YELLOW (*)   ? APPearance CLEAR (*)   ? Hgb urine dipstick LARGE (*)   ? Protein, ur 30 (*)   ? All other components within normal limits  ?LACTIC ACID, PLASMA - Abnormal; Notable for the following components:  ? Lactic Acid, Venous 4.1 (*)   ? All other components within normal limits  ?LACTIC ACID, PLASMA - Abnormal; Notable for the following components:  ? Lactic Acid, Venous 3.6  (*)   ? All other components within normal limits  ?BLOOD GAS, ARTERIAL - Abnormal; Notable for the following components:  ? pH, Arterial 7.33 (*)   ? pO2, Arterial 160 (*)   ? Bicarbonate 17.9 (*)   ? Acid-base deficit 7.1 (*)   ? All other components within normal limits  ?RESP PANEL BY RT-PCR (FLU A&B, COVID) ARPGX2  ?LIPASE, BLOOD  ?ETHANOL  ?URINE DRUG SCREEN, QUALITATIVE ([facility] ONLY)  ?MAGNESIUM  ?PROCALCITONIN  ?PHOSPHORUS  ?TYPE AND SCREEN  ? ? ? ?EKG ? ?ED ECG REPORT ?ILoleta Rose, the attending physician, personally viewed and interpreted this ECG. ? ?Date: 02/13/2022 ?EKG Time: 4:01 AM ?Rate: 85 ?Rhythm: normal sinus rhythm ?QRS Axis: normal ?Intervals: Slightly prolonged QTc interval at 494 but not substantially so ?ST/T Wave abnormalities: normal ?Narrative Interpretation: no evidence of acute ischemia ? ? ? ?RADIOLOGY ?I personally reviewed the patient's chest x-ray and there is no evidence of acute pulmonary abnormality.  The endotracheal needs to be slightly retracted (approximately 2 cm).  Lungs are relatively clear.  OG tube is appropriately placed. ? ?I also personally reviewed the patient's head CT and cervical spine CT and I see no evidence of acute trauma.  However the radiologist commented on a herniated disc at C6-7 and some impingement on the ventral cord. ? ? ? ? ?PROCEDURES: ? ?Critical Care performed: Yes, see critical care procedure note(s) ? ?.1-3 Lead EKG Interpretation ?Performed by: Loleta Rose, MD ?Authorized by: Loleta Rose, MD  ? ?  Interpretation: normal   ?  ECG rate:  75 ?  ECG rate assessment: normal   ?  Rhythm: sinus rhythm   ?  Ectopy: none   ?  Conduction: normal   ?Procedure Name: Intubation ?Date/Time: 02/13/2022 3:45 AM ?Performed by: Loleta Rose, MD ?Pre-anesthesia Checklist: Patient identified, Emergency Drugs available, Suction available and Patient being monitored ?Oxygen Delivery Method: Non-rebreather mask ?Preoxygenation: Pre-oxygenation with 100%  oxygen ?Induction Type: IV induction and Rapid sequence ?Laryngoscope Size: Glidescope and 3 ?Tube size: 7.5 mm ?Number of attempts: 1 ?Placement Confirmation: ETT inserted through vocal cords under direct vision, CO2 detector and Breath sounds checked- equal and bilateral ?Secured at: 25 cm ?Tube secured with: ETT holder ?Dental Injury: Teeth and Oropharynx as per pre-operative assessment  ? ? ? ?.Critical Care ?Performed by: York Cerise,  Kandee Keenory, MD ?Authorized by: Loleta RoseForbach, Trevone Prestwood, MD  ? ?Critical care provider statement:  ?  Critical care time (minutes):  60 ?  Critical care time was exclusive of:  Separately billable procedures and treating other patients ?  Critical care was necessary to treat or prevent imminent or life-threatening deterioration of the following conditions:  CNS failure or compromise and hepatic failure ?  Critical care was time spent personally by me on the following activities:  Development of treatment plan with patient or surrogate, evaluation of patient's response to treatment, examination of patient, obtaining history from patient or surrogate, ordering and performing treatments and interventions, ordering and review of laboratory studies, ordering and review of radiographic studies, pulse oximetry, re-evaluation of patient's condition and review of old charts ? ? ?MEDICATIONS ORDERED IN ED: ?Medications  ?fentaNYL 2500mcg in NS 250mL (3910mcg/ml) infusion-PREMIX (0 mcg/hr Intravenous Hold 02/13/22 0624)  ?propofol (DIPRIVAN) 1000 MG/100ML infusion (15 mcg/kg/min ? 84.6 kg Intravenous Rate/Dose Change 02/13/22 0652)  ?polyethylene glycol (MIRALAX / GLYCOLAX) packet 17 g (has no administration in time range)  ?heparin injection 5,000 Units (5,000 Units Subcutaneous Given 02/13/22 [id number])  ?docusate (COLACE) 50 MG/5ML liquid 100 mg (has no administration in time range)  ?lactulose (CHRONULAC) 10 GM/15ML solution 30 g (has no administration in time range)  ?docusate (COLACE) 50 MG/5ML liquid 100 mg (has no  administration in time range)  ?polyethylene glycol (MIRALAX / GLYCOLAX) packet 17 g (has no administration in time range)  ?folic acid (FOLVITE) tablet 1 mg (has no administration in time range)  ?thiamine tablet

## 2022-02-13 NOTE — Consult Note (Signed)
? ?Referring Physician:  ?No referring provider defined for this encounter. ? ?Primary Physician:  ?Caryl Never, MD ? ?Chief Complaint:  confusion ? ?History of Present Illness: ?02/13/2022 ?Shawn Torres is a 56 y.o. male who presents with the chief complaint of confusion.  He has known history of alcoholic cirrhosis and presented with acute hepatic encephalopathy due to elevated ammonia levels.  He was evaluated by the critical care team, and was intubated for airway protection.  During work-up, cervical spine CT showed evidence of disc disease C6-7. ? ?He is currently intubated and sedated.  He is not able to participate in his history. ? ?Review of Systems:  ?unobtainable ? ?Past Medical History: ?Past Medical History:  ?Diagnosis Date  ? Ascites   ? Cirrhosis, alcoholic ([place])   ? Diabetes mellitus without complication ([place])   ? Memory loss   ? ? ?Past Surgical History: ?Past Surgical History:  ?Procedure Laterality Date  ? ESOPHAGOGASTRODUODENOSCOPY N/A 10/10/2021  ? Procedure: ESOPHAGOGASTRODUODENOSCOPY (EGD);  Surgeon: [name], MD;  Location: [facility] ENDOSCOPY;  Service: Endoscopy;  Laterality: N/A;  ? ESOPHAGOGASTRODUODENOSCOPY N/A 12/27/2021  ? Procedure: ESOPHAGOGASTRODUODENOSCOPY (EGD);  Surgeon: Annamaria Helling, DO;  Location: [facility] ENDOSCOPY;  Service: Gastroenterology;  Laterality: N/A;  Spanish Interpreter  ? ESOPHAGOGASTRODUODENOSCOPY N/A 01/20/2022  ? Procedure: ESOPHAGOGASTRODUODENOSCOPY (EGD);  Surgeon: Annamaria Helling, DO;  Location: [facility] ENDOSCOPY;  Service: Gastroenterology;  Laterality: N/A;  Spanish Interpreter  ? ESOPHAGOGASTRODUODENOSCOPY N/A 02/03/2022  ? Procedure: ESOPHAGOGASTRODUODENOSCOPY (EGD);  Surgeon: Annamaria Helling, DO;  Location: [facility] ENDOSCOPY;  Service: Gastroenterology;  Laterality: N/A;  Needs Spanish Interpreter - (prefers) all calls to daughter (she speaks [place])  ? ESOPHAGOGASTRODUODENOSCOPY (EGD) WITH PROPOFOL N/A 12/11/2021  ? Procedure:  ESOPHAGOGASTRODUODENOSCOPY (EGD) WITH PROPOFOL;  Surgeon: Annamaria Helling, DO;  Location: [facility] ENDOSCOPY;  Service: Gastroenterology;  Laterality: N/A;  ? ? ?Allergies: ?Allergies as of 02/13/2022 - Review Complete 02/13/2022  ?Allergen Reaction Noted  ? Albumin gc Rash 12/24/2021  ? ? ?Medications: ? ?Current Facility-Administered Medications:  ?  cefTRIAXone (ROCEPHIN) 2 g in sodium chloride 0.9 % 100 mL IVPB, 2 g, Intravenous, Q24H, Schertz, Michele Mcalpine, MD, Last Rate: 200 mL/hr at 02/13/22 0924, 2 g at 02/13/22 0924 ?  chlorhexidine gluconate (MEDLINE KIT) (PERIDEX) 0.12 % solution 15 mL, 15 mL, Mouth Rinse, BID, Jonnie Finner, Michele Mcalpine, MD, 15 mL at 02/13/22 0916 ?  Chlorhexidine Gluconate Cloth 2 % PADS 6 each, 6 each, Topical, Q0600, Bennie Pierini, MD, 6 each at 02/13/22 [phone] ?  docusate (COLACE) 50 MG/5ML liquid 100 mg, 100 mg, Per Tube, BID PRN, Rust-Kahlel Peake, Britton L, NP, 100 mg at 02/13/22 0914 ?  docusate (COLACE) 50 MG/5ML liquid 100 mg, 100 mg, Per Tube, BID, Rust-Alireza Pollack, Toribio Harbour L, NP ?  feeding supplement (VITAL HIGH PROTEIN) liquid 1,000 mL, 1,000 mL, Per Tube, Q24H, Schertz, Michele Mcalpine, MD ?  fentaNYL 2524mg in NS 2533m(1032mml) infusion-PREMIX, 0-200 mcg/hr, Intravenous, Continuous, Rust-Raeden Schippers, BriHuel CoteP, Held at 02/13/22 [phone] folic acid (FOLVITE) tablet 1 mg, 1 mg, Per Tube, Daily, Rust-Vayda Dungee, Britton L, NP, 1 mg at 02/13/22 0915 ?  heparin injection 5,000 Units, 5,000 Units, Subcutaneous, Q8H, Rust-Dianna Deshler, BriHuel CoteP, 5,000 Units at 02/13/22 [id number] insulin aspart (novoLOG) injection 0-9 Units, 0-9 Units, Subcutaneous, Q4H, Rust-Shanell Aden, Britton L, NP, 2 Units at 02/13/22 0915 ?  lactulose (CHRONULAC) 10 GM/15ML solution 30 g, 30 g, Per Tube, Q4H, SchBennie PieriniD, 30 g at 02/13/22 0914 ?  MEDLINE mouth  rinse, 15 mL, Mouth Rinse, 10 times per day, Bennie Pierini, MD, 15 mL at 02/13/22 2542 ?  multivitamin with minerals tablet 1 tablet, 1 tablet, Per Tube, Daily,  Bennie Pierini, MD, 1 tablet at 02/13/22 0915 ?  pantoprazole sodium (PROTONIX) 40 mg/20 mL oral suspension 40 mg, 40 mg, Per Tube, BID, Rust-Farhad Burleson, Britton L, NP, 40 mg at 02/13/22 0915 ?  polyethylene glycol (MIRALAX / GLYCOLAX) packet 17 g, 17 g, Per Tube, Daily PRN, Rust-Derrell Milanes, Britton L, NP, 17 g at 02/13/22 0915 ?  polyethylene glycol (MIRALAX / GLYCOLAX) packet 17 g, 17 g, Per Tube, Daily, Rust-Lakela Kuba, Britton L, NP ?  propofol (DIPRIVAN) 1000 MG/100ML infusion, 5-50 mcg/kg/min, Intravenous, Continuous, Rust-Marrietta Thunder, Britton L, NP, Last Rate: 10.15 mL/hr at 02/13/22 0732, 20 mcg/kg/min at 02/13/22 0732 ?  thiamine tablet 100 mg, 100 mg, Per Tube, Daily, Rust-Thang Flett, Britton L, NP, 100 mg at 02/13/22 0915 ? ? ?Social History: ?Social History  ? ?Tobacco Use  ? Smoking status: Never  ? Smokeless tobacco: Never  ?Vaping Use  ? Vaping Use: Never used  ?Substance Use Topics  ? Alcohol use: Not Currently  ?  Comment: Dellie Catholic 2022  ? Drug use: Never  ? ? ?Family Medical History: ?Family History  ?Family history unknown: Yes  ? ? ?Physical Examination: ?Vitals:  ? 02/13/22 0800 02/13/22 0824  ?BP: (!) 151/78   ?Pulse:    ?Resp: 20   ?Temp: (!) 97.2 ?F (36.2 ?C)   ?SpO2: 100% 100%  ? ? ? ?General: Patient is intubated and sedated ? ?Psychiatric: Patient is sedated ? ?Head:  Pupils equal, round, and reactive to light. 1.5-1 ? ?ENT:  Oral mucosa appears well hydrated. ? ?Neck:   Supple.  ? ?Respiratory: Patient is ventilated ? ?Extremities: No edema. ? ?Vascular: Palpable pulses in dorsal pedal vessels. ? ?Skin:   On exposed skin, there are no abnormal skin lesions. ? ?NEUROLOGICAL:  ?General: In no acute distress.  He is intubated and sedated.  He is not reactive to peripheral or central stimulation. ? ?His pupils are 1.5 to 1 mm bilaterally.  He does have corneal reflexes. ? ?During lightening of his stimulation, he was able to move all 4 limbs without difficulty. ? ?Sensory, reflexes, and gait testing  not performed due to critical illness. ? ?Imaging: ?CT Head and C spine 02/13/22 ?  ?IMPRESSION: ?1. No acute intracranial CT findings or depressed skull fractures. ?2. No evidence of cervical fractures or malalignment. ?3. Early degenerative features of the cervical spine, and a ?broad-based herniated disc at C6-7 deforming the ventral cord ?surface. ?4. Intubated with retained fluid in the nasopharynx and oropharynx ?  ?  ?Electronically Signed ?  By: Telford Nab M.D. ?  On: 02/13/2022 05:10 ? ?I have personally reviewed the images and agree with the above interpretation. ? ?Labs: ? ?  Latest Ref Rng & Units 02/13/2022  ?  3:51 AM 01/03/2022  ?  3:37 AM 01/02/2022  ?  3:27 AM  ?CBC  ?WBC 4.0 - 10.5 K/uL 5.9   5.2   5.8    ?Hemoglobin 13.0 - 17.0 g/dL 8.7   8.6   8.3    ?Hematocrit 39.0 - 52.0 % 25.0   24.6   22.6    ?Platelets 150 - 400 K/uL 137   95   89    ? ? ? ? ? ?Assessment and Plan: ?Mr. Valarie Cones is a pleasant 56 y.o. 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  He is suffering from hepatic encephalopathy and is critically ill from this.  It is unclear whether he is symptomatic from his disc disease, but he is not a surgical candidate given his advanced liver disease. ? ?I would not recommend surgical intervention.  I will defer all other care to the primary team.  I remain available if an additional issue arises. ? ?Yuya Vanwingerden K. Izora Ribas MD, [place] ?Dept. of Neurosurgery ?  ?Level 5 qualifier-patient is intubated and sedated and unable to participate in history taking. ? ?

## 2022-02-13 NOTE — H&P (Signed)
? ?NAME:  Shawn Torres, MRN:  [id number], DOB:  June 27, 1966, LOS: 0 ?ADMISSION DATE:  02/13/2022, CONSULTATION DATE:  02/13/22 ?REFERRING MD:  Dr. Karma Greaser, CHIEF COMPLAINT:  Respiratory distress & unresponsive  ? ?History of Present Illness:  ?56 yo M presenting to [facility] ED via EMS from home after being found confused and on the floor of the bathroom by his girlfriend. History provided by the patient's daughter via telephone, no family bedside. Per daughter the patient had been slightly and intermittently confused over the last week or so, otherwise in his normal state of health. The patient's girlfriend also mentioned that he had been taking some of his medications late. Overnight on 3/25-3/26 the patient had been eating dinner with his girlfriend, got up to go to the bathroom and then the girlfriend heard a load bang. When she checked on the patient she found him on the bathroom floor confused. ? ?Per daughter the patient quit drinking alcohol 11 months ago. She denies any complaints of SOB, cough, nausea/vomiting, hematochezia, melena. She denies any tobacco use or any other recreational substances. ? ?ED course: ?Patient minimally responsive per ED staff, moaning with garbled speech. He was emergently intubated for airway protection. Degenerative changes seen on cervical spine, Dr. Karma Greaser called and alerted Dr. Izora Ribas to changes who will consult. ?Medications given: etomidate, LR bolus 1 L, narcan, rocuronium, lactulose 30 g ?Initial Vitals: 96, RR 18, NSR-76, BP-126/88, SpO2 100% on BVM ?Significant labs: (Labs/ Imaging personally reviewed) ?I, Domingo Pulse Rust-[place]ster, AGACNP-BC, personally viewed and interpreted this ECG. ?EKG Interpretation: Date: 02/13/22, EKG Time: 04:01, Rate: 85, Rhythm: NSR, QRS Axis:  LAD, Intervals: borderline prolonged Qtc, ST/T Wave abnormalities: non specific T wave inversions,  ?Narrative Interpretation: NSR ?Chemistry: Na+: 137, K+: 4.3, BUN/Cr.: 23/1.19, Serum CO2/ AG: 20/4,  Alk Phos: 155, Ammonia: 267 ?Hematology: WBC: 5.9, Hgb: 8.7, plt: 137 ?Lactic/ PCT: 4.1, COVID-19 & Influenza A/B: negative ?Blood Alcohol level: <10 ?ABG: 7.33/ 34/ 160/ 17.9 ?CXR 02/13/22: low volume chest with bilateral indistinct opacity that could be atelectasis or aspiration ?CT head wo contrast 02/13/22: no acute intracranial findings or depressed skull fractures ?CT cervical spine wo contrast 02/13/22: No evidence of cervical fractures or malalignment. Early degenerative features of the cervical spine, and a ?broad-based herniated disc at C6-7 deforming the ventral cord ?surface. ? ?PCCM consulted for admission due to Acute respiratory failure secondary to hepatic encephalopathy requiring mechanical ventilatory support. ? 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 ?Significant Hospital Events: ?Including procedures, antibiotic start and stop dates in addition to other pertinent events   ?02/13/22: Admit to ICU on mechanical ventilatory support for airway protection in the setting of hyperammonemia and acute hepatic encephalopathy ? ?Interim History / Subjective:  ?Patient intubated and sedated on mechanical ventilation ? ?Objective   ?Blood pressure 127/76, pulse 76, temperature (!) 96 ?F (35.6 ?C), resp. rate (!) 21, weight 84.6 kg, SpO2 100 %. ?   ?Vent Mode: AC ?FiO2 (%):  [50 %] 50 % ?Set Rate:  [16 bmp] 16 bmp ?Vt Set:  [450 mL] 450 mL ?PEEP:  [5 cmH20] 5 cmH20  ?No intake or output data in the 24 hours ending 02/13/22 0536 ?Filed Weights  ? 02/13/22 0350  ?Weight: 84.6 kg  ? ? ?Examination: ?General: Adult male, critically ill, lying in bed intubated & sedated requiring mechanical ventilation, NAD ?HEENT: MM pink/moist, anicteric, atraumatic, neck supple ?Neuro: sedated, unable to follow commands, PERRL +3 ?CV: s1s2  RRR, NSR on monitor, no r/m/g ?Pulm: Regular, non labored on PRVC FiO2 35%, PEEP 5, breath sounds coarse-BUL &  diminished-BLL ?GI: soft, rounded/distended, bs x 4 ?GU: foley in place with clear yellow urine ?Skin: skin tear LUE, scattered scabbed abrasions  ?Extremities: warm/dry, pulses + 2 R/P, trace edema noted BLE ? ?Resolved Hospital Problem list   ? ? ?Assessment & Plan:  ?Acute Hepatic Encephalopathy in the setting of hyperammonemia ?PMHx: Alcoholic Cirrhosis, esophageal varices  ?Ammonia 267 ?- lactulose 30 g TID ?- trend ammonia ?- neuro checks Q 4 ?- continue outpatient thiamine & folic acid ?- supportive care ? ?Acute Hypoxic / Hypercapnic Respiratory Failure secondary to acute hepatic encephalopathy  ?- Ventilator settings: PRVC  8 mL/kg, 35% FiO2, 5 PEEP, continue ventilator support & lung protective strategies ?- Wean PEEP & FiO2 as tolerated, maintain SpO2 > 90% ?- Head of bed elevated 30 degrees, VAP protocol in place ?- Plateau pressures less than 30 cm H20  ?- Intermittent chest x-ray & ABG PRN ?- Daily WUA with SBT as tolerated  ?- Ensure adequate pulmonary hygiene  ?- F/u cultures, trend PCT ?- Initiate Aspiration Pna coverage unasyn > f/u PCT, discontinue PRN ?- Bronchodilators PRN ?- PAD protocol in place: continue Fentanyl drip & Propofol drip ? ?Lactic Acidosis secondary to sepsis vs liver failure/respiratory failure ?Lactic: 4.1, Baseline PCT: pending, UA: negative, CXR: indistinct bilateral opacity, aspiration vs atelectasis  ?Initial interventions/workup included: 1 L of LR  ?- Supplemental oxygen as needed, to maintain SpO2 > 90% ?- f/u cultures, trend lactic/ PCT ?- Daily CBC, monitor WBC/ fever curve ?- IV antibiotics: unasyn (aspiration PNA) ?- if f/u lactic > 2, will order 2nd L of LR ?- Strict I/O's: alert provider if UOP < 0.5 mL/kg/hr ? ?Early degenerative features of the cervical spine, and a ?broad-based herniated disc at C6-7 deforming the ventral cord ?Surface (noted on CT cervical Spine) ?- consult to neurosurgery, appreciate input ? ?Type 2 Diabetes Mellitus ?recent HA1C 5.1 ?-  Monitor CBG Q 4 hours ?- SSI sensitive dosing ?- target range while in ICU: 140-180 ?- follow ICU hyper/hypo-glycemia protocol ? ?Best Practice (right click and "Reselect all SmartList Selections" daily)  ?Diet/type: NPO w/ meds via tube ?DVT prophylaxis: prophylactic heparin  ?GI prophylaxis: PPI ?Lines: N/A ?Foley:  Yes, and it is still needed ?Code Status:  full code ?Last date of multidisciplinary goals of care discussion [02/13/22] ? ?Labs   ?CBC: ?Recent Labs  ?Lab 02/13/22 ?0351  ?WBC 5.9  ?NEUTROABS 3.4  ?HGB 8.7*  ?HCT 25.0*  ?MCV 95.4  ?PLT 137*  ? ? ?Basic Metabolic Panel: ?Recent Labs  ?Lab 02/13/22 ?0351  ?NA 137  ?K 4.3  ?CL 113*  ?CO2 20*  ?GLUCOSE 159*  ?BUN 23*  ?CREATININE 1.19  ?CALCIUM 8.2*  ?MG 1.9  ? ?GFR: ?Estimated Creatinine Clearance: 74.6 mL/min (by C-G formula based on SCr of 1.19 mg/dL). ?Recent Labs  ?Lab 02/13/22 ?0351  ?WBC 5.9  ?LATICACIDVEN 4.1*  ? ? ?Liver Function Tests: ?Recent Labs  ?Lab 02/13/22 ?0351  ?AST 38  ?ALT 17  ?ALKPHOS 155*  ?BILITOT 2.2*  ?PROT 6.2*  ?ALBUMIN 3.0*  ? ?Recent Labs  ?Lab 02/13/22 ?0351  ?LIPASE 41  ? ?Recent Labs  ?Lab 02/13/22 ?0351  ?AMMONIA 267*  ? ? ?ABG ?   ?Component Value Date/Time  ? PHART 7.50 (H) 11/12/2021 1609  ? PCO2ART 20 (L) 11/12/2021 1609  ? PO2ART 110 (H) 11/12/2021 1609  ? HCO3 15.6 (L)  11/12/2021 1609  ? ACIDBASEDEF 6.3 (H) 11/12/2021 1609  ? O2SAT 98.7 11/12/2021 1609  ?  ? ?Coagulation Profile: ?Recent Labs  ?Lab 02/13/22 ?0351  ?INR 1.8*  ? ? ?Cardiac Enzymes: ?No results for input(s): CKTOTAL, CKMB, CKMBINDEX, TROPONINI in the last 168 hours. ? ?HbA1C: ?Hgb A1c MFr Bld  ?Date/Time Value Ref Range Status  ?12/31/2021 06:20 AM 5.1 4.8 - 5.6 % Final  ?  Comment:  ?  (NOTE) ?Pre diabetes:          5.7%-6.4% ? ?Diabetes:              >6.4% ? ?Glycemic control for   <7.0% ?adults with diabetes ?  ?10/10/2021 04:22 PM 4.4 (L) 4.8 - 5.6 % Final  ?  Comment:  ?  (NOTE) ?Pre diabetes:          5.7%-6.4% ? ?Diabetes:               >6.4% ? ?Glycemic control for   <7.0% ?adults with diabetes ?  ? ? ?CBG: ?No results for input(s): GLUCAP in the last 168 hours. ? ?Review of Systems:   ?UTA- patient intubated and sedated on mechanical ventilatory support. ? ?Past Me

## 2022-02-13 NOTE — Progress Notes (Signed)
ETT pulled back to 23 cm at lip per CXR recommendation and MD request ?

## 2022-02-13 NOTE — ED Triage Notes (Signed)
Pt found unresponsive on the bathroom floor at home. EMS states that he is only responsive to pain.  ?

## 2022-02-13 NOTE — Progress Notes (Signed)
Dr. Arlean Hopping notified this am during assessment patient posturing and right pupil would not constrict. During following assessment right pupil did constrict and intermit posturing. Propofol titrated down. At this time patient has not had a bowel mvt, ammonia level will be rechecked. Foley intact, tube feeds started. Continue to assess.  ?

## 2022-02-13 NOTE — Progress Notes (Signed)
Pt transported to CT and returned to ED without incident. Pt remains on the vent and is tol well at this time. ?

## 2022-02-14 DIAGNOSIS — K7211 Chronic hepatic failure with coma: Secondary | ICD-10-CM

## 2022-02-14 DIAGNOSIS — E7220 Disorder of urea cycle metabolism, unspecified: Secondary | ICD-10-CM

## 2022-02-14 DIAGNOSIS — E872 Acidosis, unspecified: Secondary | ICD-10-CM

## 2022-02-14 DIAGNOSIS — J9600 Acute respiratory failure, unspecified whether with hypoxia or hypercapnia: Secondary | ICD-10-CM

## 2022-02-14 LAB — GLUCOSE, CAPILLARY
Glucose-Capillary: 100 mg/dL — ABNORMAL HIGH (ref 70–99)
Glucose-Capillary: 103 mg/dL — ABNORMAL HIGH (ref 70–99)
Glucose-Capillary: 111 mg/dL — ABNORMAL HIGH (ref 70–99)
Glucose-Capillary: 111 mg/dL — ABNORMAL HIGH (ref 70–99)
Glucose-Capillary: 119 mg/dL — ABNORMAL HIGH (ref 70–99)
Glucose-Capillary: 139 mg/dL — ABNORMAL HIGH (ref 70–99)
Glucose-Capillary: 151 mg/dL — ABNORMAL HIGH (ref 70–99)

## 2022-02-14 LAB — COMPREHENSIVE METABOLIC PANEL
ALT: 19 U/L (ref 0–44)
AST: 42 U/L — ABNORMAL HIGH (ref 15–41)
Albumin: 2.8 g/dL — ABNORMAL LOW (ref 3.5–5.0)
Alkaline Phosphatase: 135 U/L — ABNORMAL HIGH (ref 38–126)
Anion gap: 11 (ref 5–15)
BUN: 25 mg/dL — ABNORMAL HIGH (ref 6–20)
CO2: 21 mmol/L — ABNORMAL LOW (ref 22–32)
Calcium: 8.3 mg/dL — ABNORMAL LOW (ref 8.9–10.3)
Chloride: 113 mmol/L — ABNORMAL HIGH (ref 98–111)
Creatinine, Ser: 1.25 mg/dL — ABNORMAL HIGH (ref 0.61–1.24)
GFR, Estimated: >60 mL/min (ref >60)
Glucose, Bld: 123 mg/dL — ABNORMAL HIGH (ref 70–99)
Potassium: 3.8 mmol/L (ref 3.5–5.1)
Sodium: 145 mmol/L (ref 135–145)
Total Bilirubin: 1.8 mg/dL — ABNORMAL HIGH (ref 0.3–1.2)
Total Protein: 6 g/dL — ABNORMAL LOW (ref 6.5–8.1)

## 2022-02-14 LAB — PROCALCITONIN: Procalcitonin: <0.1 ng/mL

## 2022-02-14 LAB — CBC
HCT: 26.2 % — ABNORMAL LOW (ref 39.0–52.0)
Hemoglobin: 9 g/dL — ABNORMAL LOW (ref 13.0–17.0)
MCH: 33.6 pg (ref 26.0–34.0)
MCHC: 34.4 g/dL (ref 30.0–36.0)
MCV: 97.8 fL (ref 80.0–100.0)
Platelets: 137 10*3/uL — ABNORMAL LOW (ref 150–400)
RBC: 2.68 MIL/uL — ABNORMAL LOW (ref 4.22–5.81)
RDW: 17.3 % — ABNORMAL HIGH (ref 11.5–15.5)
WBC: 8.8 10*3/uL (ref 4.0–10.5)
nRBC: 0 % (ref 0.0–0.2)

## 2022-02-14 LAB — MAGNESIUM
Magnesium: 2.1 mg/dL (ref 1.7–2.4)
Magnesium: 2.1 mg/dL (ref 1.7–2.4)
Magnesium: 2.1 mg/dL (ref 1.7–2.4)

## 2022-02-14 LAB — PHOSPHORUS
Phosphorus: 4.6 mg/dL (ref 2.5–4.6)
Phosphorus: 4 mg/dL (ref 2.5–4.6)
Phosphorus: 4.3 mg/dL (ref 2.5–4.6)

## 2022-02-14 LAB — PROTIME-INR
INR: 1.9 — ABNORMAL HIGH (ref 0.8–1.2)
Prothrombin Time: 21.5 seconds — ABNORMAL HIGH (ref 11.4–15.2)

## 2022-02-14 LAB — LACTIC ACID, PLASMA: Lactic Acid, Venous: 2.5 mmol/L (ref 0.5–1.9)

## 2022-02-14 MED ORDER — ACETAMINOPHEN 325 MG PO TABS
650.0000 mg | ORAL_TABLET | Freq: Once | ORAL | Status: AC
  Administered 2022-02-14: 650 mg
  Filled 2022-02-14: qty 2

## 2022-02-14 MED ORDER — FUROSEMIDE 10 MG/ML IJ SOLN
80.0000 mg | Freq: Once | INTRAMUSCULAR | Status: AC
Start: 2022-02-14 — End: 2022-02-14
  Administered 2022-02-14: 80 mg via INTRAVENOUS
  Filled 2022-02-14: qty 8

## 2022-02-14 MED ORDER — IBUPROFEN 100 MG/5ML PO SUSP
600.0000 mg | Freq: Once | ORAL | Status: DC
Start: 2022-02-14 — End: 2022-02-14

## 2022-02-14 MED ORDER — FREE WATER
30.0000 mL | Status: DC
  Administered 2022-02-14 – 2022-02-15 (×7): 30 mL

## 2022-02-14 MED ORDER — PROSOURCE TF PO LIQD
45.0000 mL | Freq: Three times a day (TID) | ORAL | Status: DC
  Administered 2022-02-14 – 2022-02-15 (×3): 45 mL
  Filled 2022-02-14 (×4): qty 45

## 2022-02-14 MED ORDER — FENTANYL CITRATE PF 50 MCG/ML IJ SOSY
25.0000 ug | PREFILLED_SYRINGE | INTRAMUSCULAR | Status: DC | PRN
  Administered 2022-02-14: 50 ug via INTRAVENOUS
  Filled 2022-02-14: qty 1

## 2022-02-14 MED ORDER — VITAL HIGH PROTEIN PO LIQD: 1000.0000 mL | ORAL | Status: DC

## 2022-02-14 MED ORDER — VITAL 1.5 CAL PO LIQD
1000.0000 mL | ORAL | Status: DC
  Administered 2022-02-14: 1000 mL

## 2022-02-14 MED ORDER — FUROSEMIDE 10 MG/ML IJ SOLN
100.0000 mg | Freq: Once | INTRAVENOUS | Status: AC
  Administered 2022-02-14: 100 mg via INTRAVENOUS
  Filled 2022-02-14: qty 10

## 2022-02-14 NOTE — Progress Notes (Signed)
? ?NAME:  Shawn Torres, MRN:  [id number], DOB:  1966/04/09, LOS: 1 ?ADMISSION DATE:  02/13/2022, CONSULTATION DATE:  02/13/22 ?REFERRING MD:  Dr. Karma Greaser, CHIEF COMPLAINT:  Respiratory distress & unresponsive  ? ?History of Present Illness:  ?56 yo M presenting to [facility] ED via EMS from home after being found confused and on the floor of the bathroom by his girlfriend. History provided by the patient's daughter via telephone, no family bedside. Per daughter the patient had been slightly and intermittently confused over the last week or so, otherwise in his normal state of health. The patient's girlfriend also mentioned that he had been taking some of his medications late. Overnight on 3/25-3/26 the patient had been eating dinner with his girlfriend, got up to go to the bathroom and then the girlfriend heard a load bang. When she checked on the patient she found him on the bathroom floor confused. ? ?Per daughter the patient quit drinking alcohol 11 months ago. She denies any complaints of SOB, cough, nausea/vomiting, hematochezia, melena. She denies any tobacco use or any other recreational substances. ? ?ED course: ?Patient minimally responsive per ED staff, moaning with garbled speech. He was emergently intubated for airway protection. Degenerative changes seen on cervical spine, Dr. Karma Greaser called and alerted Dr. Izora Ribas to changes who will consult. ?Medications given: etomidate, LR bolus 1 L, narcan, rocuronium, lactulose 30 g ?Initial Vitals: 96, RR 18, NSR-76, BP-126/88, SpO2 100% on BVM ?Significant labs: (Labs/ Imaging personally reviewed) ?I, Domingo Pulse [place], AGACNP-BC, personally viewed and interpreted this ECG. ?EKG Interpretation: Date: 02/13/22, EKG Time: 04:01, Rate: 85, Rhythm: NSR, QRS Axis:  LAD, Intervals: borderline prolonged Qtc, ST/T Wave abnormalities: non specific T wave inversions,  ?Narrative Interpretation: NSR ?Chemistry: Na+: 137, K+: 4.3, BUN/Cr.: 23/1.19, Serum CO2/ AG: 20/4,  Alk Phos: 155, Ammonia: 267 ?Hematology: WBC: 5.9, Hgb: 8.7, plt: 137 ?Lactic/ PCT: 4.1, COVID-19 & Influenza A/B: negative ?Blood Alcohol level: <10 ?ABG: 7.33/ 34/ 160/ 17.9 ?CXR 02/13/22: low volume chest with bilateral indistinct opacity that could be atelectasis or aspiration ?CT head wo contrast 02/13/22: no acute intracranial findings or depressed skull fractures ?CT cervical spine wo contrast 02/13/22: No evidence of cervical fractures or malalignment. Early degenerative features of the cervical spine, and a ?broad-based herniated disc at C6-7 deforming the ventral cord ?surface. ? ?PCCM consulted for admission due to Acute respiratory failure secondary to hepatic encephalopathy requiring mechanical ventilatory support. ? 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 ?Significant Hospital Events: ?Including procedures, antibiotic start and stop dates in addition to other pertinent events   ?02/13/22: Admit to ICU on mechanical ventilatory support for airway protection in the setting of hyperammonemia and acute hepatic encephalopathy ?02/14/22: Mental status currently precluding extubation.  Continue with Lactulose q4h. ? ?Interim History / Subjective:  ?-No significant events noted overnight ?-Afebrile, hemodynamically stable, NO vasopressors ?-Currently in Pressure Support 5/5, pulling good volumes ?-Mental status is currently precluding extubation ?-Ammonia level yesterday evening trending down to 132 (267) ?-Nursing does report this morning pt is starting to have some stool ?-Creatinine slightly increased to 1.25 (1.19), UOP 800 cc last 24 hrs (net + 2L since admit) ? ?Objective   ?Blood pressure 106/74, pulse 88, temperature 99.1 ?F (37.3 ?C), temperature source Bladder, resp. rate 13, weight 82.7 kg, SpO2 100 %. ?   ?Vent Mode: PCV ?FiO2 (%):  [21 %-30 %] 21 % ?Set Rate:  [16 bmp] 16 bmp ?Vt Set:  [893  mL] 450 mL ?PEEP:  [5 cmH20] 5  cmH20 ?Pressure Support:  [5 cmH20] 5 cmH20 ?Plateau Pressure:  [10 cmH20-16 cmH20] 16 cmH20  ? ?Intake/Output Summary (Last 24 hours) at 02/14/2022 0734 ?Last data filed at 02/14/2022 0556 ?Gross per 24 hour  ?Intake 1387.73 ml  ?Output 800 ml  ?Net 587.73 ml  ? ?Filed Weights  ? 02/13/22 0350 02/13/22 0800  ?Weight: 84.6 kg 82.7 kg  ? ? ?Examination: ?General: Adult male, acute on chronically ill, lying in bed intubated & sedated requiring mechanical ventilation, NAD ?HEENT: MM pink/moist, anicteric, atraumatic, neck supple, orally intubated ?Neuro: sedated, unable to follow commands, PERRL +3 ?CV: s1s2 RRR, NSR on monitor, no r/m/g ?Pulm: Regular, non labored on PS FiO2 21%, 5/5, breath sounds clear bilaterally ?GI: soft, rounded/distended, bs + x 4 ?GU: foley in place with clear yellow urine ?Skin: skin tear LUE, scattered scabbed abrasions  ?Extremities: warm/dry, pulses + 2 R/P, trace edema noted BLE ? ?Resolved Hospital Problem list   ? ? ?Assessment & Plan:  ? ?Acute Hepatic Encephalopathy in the setting of hyperammonemia ?PMHx: Alcoholic Cirrhosis, esophageal varices  ?Ammonia 267 ?-Maintain a RASS goal of 0 to -1 ?-Propofol as needed to maintain RASS goal ?-Avoid sedating medications as able ?-Daily wake up assessment ?-Lactulose 30 g q4h ?-Trend ammonia ?-Neuro checks Q 4 ?-Continue outpatient thiamine & folic acid ?-CT Head on admission negative for acute intracranial abnormality ?-UDS negative ? ?Acute Hypoxic / Hypercapnic Respiratory Failure secondary to acute hepatic encephalopathy  ?Intubated for airway protection ?-Full vent support, implement lung protective strategies ?-Plateau pressures less than 30 cm H20 ?-Wean FiO2 & PEEP as tolerated to maintain O2 sats >92% ?-Follow intermittent Chest X-ray & ABG as needed ?-Spontaneous Breathing Trials when respiratory parameters met and mental status permits ~ mental status is currently precluding extubation ?-Implement VAP Bundle ?-Prn  Bronchodilators ? ?Lactic Acidosis secondary to sepsis vs liver failure/respiratory failure ?Lactic: 4.1, Baseline PCT: pending, UA: negative, CXR: indistinct bilateral opacity, aspiration vs atelectasis  ?Initial interventions/workup included: 1 L of LR  ?- Supplemental oxygen as needed, to maintain SpO2 > 90% ?-Monitor fever curve ?-Trend WBC's & Procalcitonin ?-Follow cultures as above ?-Continue empiric Ceftriaxone for possible Aspiration Pneumonia & ? SBP pending cultures & sensitivities ?-Trend lactic acid until normalized ? ?Early degenerative features of the cervical spine, and a ?broad-based herniated disc at C6-7 deforming the ventral cord ?Surface (noted on CT cervical Spine) ?- consult to neurosurgery, appreciate input ~ NO surgical intervention recommended at this time ? ?Type 2 Diabetes Mellitus ?recent HA1C 5.1 ?- Monitor CBG Q 4 hours ?- SSI sensitive dosing ?- target range while in ICU: 140-180 ?- follow ICU hyper/hypo-glycemia protocol ? ?Best Practice (right click and "Reselect all SmartList Selections" daily)  ?Diet/type: NPO w/ meds via tube ?DVT prophylaxis: prophylactic heparin  ?GI prophylaxis: PPI ?Lines: N/A ?Foley:  Yes, and it is still needed ?Code Status:  full code ?Last date of multidisciplinary goals of care discussion [02/14/22] ? ?Labs   ?CBC: ?Recent Labs  ?Lab 02/13/22 ?0351 02/14/22 ?0431  ?WBC 5.9 8.8  ?NEUTROABS 3.4  --   ?HGB 8.7* 9.0*  ?HCT 25.0* 26.2*  ?MCV 95.4 97.8  ?PLT 137* 137*  ? ? ? ?Basic Metabolic Panel: ?Recent Labs  ?Lab 02/13/22 ?0351 02/14/22 ?0431  ?NA 137 145  ?K 4.3 3.8  ?CL 113* 113*  ?CO2 20* 21*  ?GLUCOSE 159* 123*  ?BUN 23* 25*  ?CREATININE 1.19 1.25*  ?CALCIUM 8.2* 8.3*  ?MG  1.9 2.1  ?PHOS 3.5 4.6  ? ? ?GFR: ?Estimated Creatinine Clearance: 70.3 mL/min (A) (by C-G formula based on SCr of 1.25 mg/dL (H)). ?Recent Labs  ?Lab 02/13/22 ?0351 02/13/22 ?9012 02/14/22 ?0431  ?PROCALCITON <0.10  --  <0.10  ?WBC 5.9  --  8.8  ?LATICACIDVEN 4.1* 3.6*  --    ? ? ? ?Liver Function Tests: ?Recent Labs  ?Lab 02/13/22 ?0351 02/14/22 ?0431  ?AST 38 42*  ?ALT 17 19  ?ALKPHOS 155* 135*  ?BILITOT 2.2* 1.8*  ?PROT 6.2* 6.0*  ?ALBUMIN 3.0* 2.8*  ? ? ?Recent Labs  ?Lab 02/13/22 ?0351  ?LIPASE 41  ? ? ?Recent

## 2022-02-14 NOTE — Progress Notes (Addendum)
Initial Nutrition Assessment ? ?DOCUMENTATION CODES:  ? ?Non-severe (moderate) malnutrition in context of chronic illness ? ?INTERVENTION:  ? ?-D/c Vital High Protein ?-Initiate Vital 1.5 @ 25 ml/hr via OGT and increase by 10 ml every 4 hours to goal rate of 55 ml/hr.  ? ?45 ml Prosource TF TID. ? ?30 ml free water flush every 4 hours to maintain tube patency   ? ?Tube feeding regimen provides 2100 kcal (100% of needs), 122 grams of protein, and 1008 ml of H2O. Total free water: 1188 ml daily ? ?-Monitor Mg, K, and Phos daily and replete as needed secondary to refeeding risk ? ?NUTRITION DIAGNOSIS:  ? ?Moderate Malnutrition related to chronic illness (cirrhosis) as evidenced by mild fat depletion, mild muscle depletion, moderate muscle depletion. ? ?GOAL:  ? ?Patient will meet greater than or equal to 90% of their needs ? ?MONITOR:  ? ?Vent status, Labs, Weight trends, TF tolerance, Skin, I & O's ? ?REASON FOR ASSESSMENT:  ? ?Consult, Ventilator ?Enteral/tube feeding initiation and management ? ?ASSESSMENT:  ? ?Shawn Torres is admitted for acute hepatic encephalopathy with significant hyperammonemia (NH3 267) in the setting of alcoholic cirrhosis with known esophageal varices (s/p banding on 01/20/22) and portal vein thrombus requiring intubation for airway protection. He follows with [facility] clinic for cirrhosis. He last underwent large volume paracentesis on 3/23 with 9.85 L of ascitic fluid removed; fluid not sent for testing. Medication adherence to lactulose has been a known issue. MELD score 18 on arrival. He was last admitted from 2/10 - 2/13 for hepatic encephalopathy but did not require ICU level care during that admission. ? ?Pt admitted with acute hepatic encephalopathy.  ? ?Patient is currently intubated on ventilator support. OGT placement confirmed by x-ray on 02/13/22. ?MV: 12.3 L/min ?Temp (24hrs), Avg:98.8 ?F (37.1 ?C), Min:97 ?F (36.1 ?C), Max:99.9 ?F (37.7 ?C) ? ?Reviewed I/O's: +687 ml x 24 hours  and +1.7 L since admission ? ?UOP: 800 ml x 24 hours ?  ?Per neurosurgery notes, pt with C6-7 disc disease. Pt is not a candidate for surgical intervention at this time. ? ?Case discussed with MD, RN, and during ICU rounds. Pt is noncompliant with lactulose PTA. He currently has a rectal tube with output; plan to remove tomorrow.  ? ?Pt currently on no sedation. TF off currently secondary to wake-up assess, but plan to resume. Noted pt currently on trickle feeds; received permission from MD to advance today.  ? ?Per MD, plan for liver doppler once extubated.  ? ?Medications reviewed and include colace, folic acid, lactulose, miralax, and thiamine.  ?                                                      ?Lab Results  ?Component Value Date  ? HGBA1C 5.1 12/31/2021  ? PTA DM medications are none.  ? ?Labs reviewed: CBGS: 100-111 (inpatient orders for glycemic control are 0-9 units insulin aspart every 4 hours).   ? ?NUTRITION - FOCUSED PHYSICAL EXAM: ? ?Flowsheet Row Most Recent Value  ?Orbital Region No depletion  ?Upper Arm Region Mild depletion  ?Thoracic and Lumbar Region No depletion  ?Buccal Region Mild depletion  ?Temple Region Moderate depletion  ?Clavicle Bone Region Moderate depletion  ?Clavicle and Acromion Bone Region Moderate depletion  ?Scapular Bone Region Moderate depletion  ?Dorsal Hand  No depletion  ?Patellar Region Moderate depletion  ?Anterior Thigh Region Moderate depletion  ?Posterior Calf Region Moderate depletion  ?Edema (RD Assessment) Mild  ?Hair Reviewed  ?Eyes Reviewed  ?Mouth Reviewed  ?Skin Reviewed  ?Nails Reviewed  ? ?  ? ? ?Diet Order:   ?Diet Order   ? ?       ?  Diet NPO time specified  Diet effective now       ?  ? ?  ?  ? ?  ? ? ?EDUCATION NEEDS:  ? ?Not appropriate for education at this time ? ?Skin:  Skin Assessment: Skin Integrity Issues: ?Skin Integrity Issues:: Other (Comment) ?Other: wound to lt lower posterior arm ? ?Last BM:  3/27/523 (via rectal tube) ? ?Height:  ? ?Ht  Readings from Last 1 Encounters:  ?02/03/22 5\' 7"  (1.702 m)  ? ? ?Weight:  ? ?Wt Readings from Last 1 Encounters:  ?02/13/22 82.7 kg  ? ? ?Ideal Body Weight:  67.3 kg ? ?BMI:  Body mass index is 28.56 kg/m?. ? ?Estimated Nutritional Needs:  ? ?Kcal:  2034 ? ?Protein:  115-130 grams ? ?Fluid:  > 2 L ? ? ? ?2035, RD, LDN, CDCES ?Registered Dietitian II ?Certified Diabetes Care and Education Specialist ?Please refer to [facility] for RD and/or RD on-call/weekend/after hours pager  ?

## 2022-02-14 NOTE — Plan of Care (Incomplete)
Patient will benefit from ongoing skilled PT services in {setting:3041680} to continue to advance safe functional mobility, address ongoing impairments in ***, and minimize fall risk. ?

## 2022-02-14 NOTE — Progress Notes (Signed)
Pt failed WUA this shift. Pt remains at -4 RASS not opening eyes or following commands 2 hours after sedation turned off. Pt had multiple strong coughing fits with and without ETT suctioning. Copious oral and ETT secretions during WUA. Pt lightly re-sedated with minimum sedation to decrease gag reflex stimulation. Pt had large loose stools this shift.  ?

## 2022-02-14 NOTE — Consult Note (Signed)
PHARMACY CONSULT NOTE - FOLLOW UP ? ?Pharmacy Consult for Electrolyte Monitoring and Replacement  ? ?Recent Labs: ?Potassium (mmol/L)  ?Date Value  ?02/14/2022 3.8  ? ?Magnesium (mg/dL)  ?Date Value  ?02/14/2022 2.1  ? ?Calcium (mg/dL)  ?Date Value  ?02/14/2022 8.3 (L)  ? ?Albumin (g/dL)  ?Date Value  ?02/14/2022 2.8 (L)  ? ?Phosphorus (mg/dL)  ?Date Value  ?02/14/2022 4.6  ? ?Sodium (mmol/L)  ?Date Value  ?02/14/2022 145  ? ?Corrected Calcium: 9.0 ? ?Assessment: ?Pharmacy has been consulted to monitor and replace electrolytes in 55yo patient found unresponsive at home. He was placed on a mechanical ventilator s/t hepatic encephalopathy(ammonia level of 267 upon admission). Patient received 2L lactated ringers bolus in ED. Admitted to ICU. ? ?IVF: none currently ? ? ?Goal of Therapy:  ?Electrolytes WNL ? ?Plan:  ?K: 4.3>3.8; scr 1.19>1.25 ?I/O: +1.7L; UOP 0.45ml/k/h ?No current supplementation needed ?Will follow up with AM labs ? ?Lorna Dibble ,PharmD ?Clinical Pharmacist ?02/14/2022 7:27 AM ? ?

## 2022-02-14 NOTE — TOC Initial Note (Signed)
Transition of Care (TOC) - Initial/Assessment Note  ? ? ?Patient Details  ?Name: Shawn Torres ?MRN: [id number] ?Date of Birth: November 29, 1965 ? ?Transition of Care (TOC) CM/SW Contact:    ?Shelbie Hutching, RN ?Phone Number: ?02/14/2022, 3:40 PM ? ?Clinical Narrative:                 ?Patient admitted for acute hepatic encephalopathy, history of alcohol abuse and liver cirrhosis and esophageal varices.   ?Patient is currently intubated and sedated. ? ?TOC will follow.  ? ?Expected Discharge Plan:  (TBD) ?Barriers to Discharge: Continued Medical Work up ? ? ?Patient Goals and CMS Choice ?  ?  ?  ? ?Expected Discharge Plan and Services ?Expected Discharge Plan:  (TBD) ?  ?  ?  ?  ?                ?  ?  ?  ?  ?  ?  ?  ?  ?  ?  ? ?Prior Living Arrangements/Services ?  ?  ?  ?       ?  ?  ?  ?  ? ?Activities of Daily Living ?  ?  ? ?Permission Sought/Granted ?  ?  ?   ?   ?   ?   ? ?Emotional Assessment ?  ?Attitude/Demeanor/Rapport: Intubated (Following Commands or Not Following Commands) ?  ?  ?Alcohol / Substance Use: Alcohol Use (previous alcohol use) ?Psych Involvement: No (comment) ? ?Admission diagnosis:  Hepatic encephalopathy [K76.82] ?Hyperammonemia ([place]) [E72.20] ?Acute hepatic encephalopathy [K76.82] ?Acute respiratory failure, unspecified whether with hypoxia or hypercapnia ([place]) [J96.00] ?Altered mental status, unspecified altered mental status type [R41.82] ?Chronic liver failure with hepatic coma ([place]) [K72.11] ?Patient Active Problem List  ? Diagnosis Date Noted  ? Acute hepatic encephalopathy 02/13/2022  ? Abdominal pain 12/18/2021  ? 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 Ascites   ? Acute upper GI bleed 12/13/2021  ? Acute blood loss anemia (ABLA) 12/11/2021  ? 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 Confusion   ? SOB (shortness of breath)   ? Lactic acidosis 11/06/2021  ? GERD (gastroesophageal reflux disease) 11/06/2021  ? BPH (benign prostatic hyperplasia) 11/06/2021  ? Stage 3a chronic kidney  disease (CKD) ([place]) 11/03/2021  ? Macrocytic anemia 11/03/2021  ? Alcoholic cirrhosis of liver with ascites ([place]) 10/29/2021  ? Alcohol use disorder, severe, in early remission ([place])   ? Malnutrition of moderate degree 10/15/2021  ? Abdominal distention   ? Acute respiratory failure with hypoxia ([place])   ? Secondary esophageal varices with bleeding ([facility])   ? Hematemesis 10/10/2021  ? Hepatic encephalopathy   ? Secondary esophageal varices without bleeding ([place])   ? Thrombocytopenia ([place]) 04/30/2021  ? Type 2 diabetes mellitus, without long-term current use of insulin ([place]) 04/30/2021  ? Hyponatremia 05/24/2019  ? Pneumonia due to COVID-19 virus 05/24/2019  ? Transaminitis 05/24/2019  ? Alcohol dependence with uncomplicated withdrawal ([place]) 05/24/2019  ? Hypokalemia 05/24/2019  ? COVID-19 05/24/2019  ? ?PCP:  Caryl Never, MD ?Pharmacy:   ?[id number] - [place], [place] - [address] ?[address] ?[place] [address] ?Phone: [phone] Fax: [phone] ? ? ? ? ?Social Determinants of Health (SDOH) Interventions ?  ? ?Readmission Risk Interventions ? ?  12/13/2021  ?  4:10 PM 10/12/2021  ? 12:41 PM  ?Readmission Risk Prevention Plan  ?Transportation Screening Complete Complete  ?PCP or Specialist Appt within 3-5 Days  Complete  ?[place] or Home Care  Consult  Not Complete  ?[place] or Home Care Consult comments  unsure of discharge needs  ?Social Work Consult for [place] Planning/Counseling  Complete  ?Palliative Care Screening  Not Applicable  ?Medication Review Press photographer) Complete Complete  ?SW Recovery Care/Counseling Consult Complete   ?Palliative Care Screening Not Applicable   ?[place] Not Applicable   ? ? ? ?

## 2022-02-15 ENCOUNTER — Inpatient Hospital Stay: Payer: Self-pay | Admitting: Radiology

## 2022-02-15 ENCOUNTER — Encounter (HOSPITAL_COMMUNITY): Payer: Self-pay

## 2022-02-15 ENCOUNTER — Inpatient Hospital Stay: Payer: Self-pay

## 2022-02-15 HISTORY — PX: IR PARACENTESIS: IMG2679

## 2022-02-15 LAB — BODY FLUID CELL COUNT WITH DIFFERENTIAL
Eos, Fluid: 0 %
Lymphs, Fluid: 43 %
Monocyte-Macrophage-Serous Fluid: 51 %
Neutrophil Count, Fluid: 6 %
Total Nucleated Cell Count, Fluid: 179 cu mm

## 2022-02-15 LAB — MAGNESIUM
Magnesium: 2.2 mg/dL (ref 1.7–2.4)
Magnesium: 2.3 mg/dL (ref 1.7–2.4)

## 2022-02-15 LAB — COMPREHENSIVE METABOLIC PANEL
ALT: 18 U/L (ref 0–44)
AST: 42 U/L — ABNORMAL HIGH (ref 15–41)
Albumin: 2.6 g/dL — ABNORMAL LOW (ref 3.5–5.0)
Alkaline Phosphatase: 136 U/L — ABNORMAL HIGH (ref 38–126)
Anion gap: 5 (ref 5–15)
BUN: 33 mg/dL — ABNORMAL HIGH (ref 6–20)
CO2: 22 mmol/L (ref 22–32)
Calcium: 7.9 mg/dL — ABNORMAL LOW (ref 8.9–10.3)
Chloride: 116 mmol/L — ABNORMAL HIGH (ref 98–111)
Creatinine, Ser: 1.47 mg/dL — ABNORMAL HIGH (ref 0.61–1.24)
GFR, Estimated: 57 mL/min — ABNORMAL LOW (ref >60)
Glucose, Bld: 208 mg/dL — ABNORMAL HIGH (ref 70–99)
Potassium: 3.6 mmol/L (ref 3.5–5.1)
Sodium: 143 mmol/L (ref 135–145)
Total Bilirubin: 1.8 mg/dL — ABNORMAL HIGH (ref 0.3–1.2)
Total Protein: 5.8 g/dL — ABNORMAL LOW (ref 6.5–8.1)

## 2022-02-15 LAB — GLUCOSE, CAPILLARY
Glucose-Capillary: 172 mg/dL — ABNORMAL HIGH (ref 70–99)
Glucose-Capillary: 189 mg/dL — ABNORMAL HIGH (ref 70–99)
Glucose-Capillary: 138 mg/dL — ABNORMAL HIGH (ref 70–99)
Glucose-Capillary: 89 mg/dL (ref 70–99)
Glucose-Capillary: 90 mg/dL (ref 70–99)

## 2022-02-15 LAB — GRAM STAIN

## 2022-02-15 LAB — BLOOD GAS, VENOUS
Acid-base deficit: 2.6 mmol/L — ABNORMAL HIGH (ref 0.0–2.0)
Bicarbonate: 20.6 mmol/L (ref 20.0–28.0)
O2 Saturation: 99.6 %
Patient temperature: 37
pCO2, Ven: 29 mmHg — ABNORMAL LOW (ref 44–60)
pH, Ven: 7.46 — ABNORMAL HIGH (ref 7.25–7.43)
pO2, Ven: 125 mmHg — ABNORMAL HIGH (ref 32–45)

## 2022-02-15 LAB — PROCALCITONIN: Procalcitonin: 1.89 ng/mL

## 2022-02-15 LAB — CBC
HCT: 25.3 % — ABNORMAL LOW (ref 39.0–52.0)
Hemoglobin: 9.2 g/dL — ABNORMAL LOW (ref 13.0–17.0)
MCH: 35.8 pg — ABNORMAL HIGH (ref 26.0–34.0)
MCHC: 36.4 g/dL — ABNORMAL HIGH (ref 30.0–36.0)
MCV: 98.4 fL (ref 80.0–100.0)
Platelets: 105 10*3/uL — ABNORMAL LOW (ref 150–400)
RBC: 2.57 MIL/uL — ABNORMAL LOW (ref 4.22–5.81)
RDW: 17.8 % — ABNORMAL HIGH (ref 11.5–15.5)
WBC: 11.4 10*3/uL — ABNORMAL HIGH (ref 4.0–10.5)
nRBC: 0 % (ref 0.0–0.2)

## 2022-02-15 LAB — PROTIME-INR
INR: 2.2 — ABNORMAL HIGH (ref 0.8–1.2)
Prothrombin Time: 24.1 seconds — ABNORMAL HIGH (ref 11.4–15.2)

## 2022-02-15 LAB — CORTISOL-AM, BLOOD: Cortisol - AM: 10 ug/dL (ref 6.7–22.6)

## 2022-02-15 LAB — LACTATE DEHYDROGENASE, PLEURAL OR PERITONEAL FLUID: LD, Fluid: 38 U/L — ABNORMAL HIGH (ref 3–23)

## 2022-02-15 LAB — TSH: TSH: 0.948 u[IU]/mL (ref 0.350–4.500)

## 2022-02-15 LAB — AMMONIA: Ammonia: 36 umol/L — ABNORMAL HIGH (ref 9–35)

## 2022-02-15 LAB — PATHOLOGIST SMEAR REVIEW

## 2022-02-15 LAB — PHOSPHORUS
Phosphorus: 4.6 mg/dL (ref 2.5–4.6)
Phosphorus: 4.6 mg/dL (ref 2.5–4.6)

## 2022-02-15 LAB — LACTIC ACID, PLASMA: Lactic Acid, Venous: 2.8 mmol/L (ref 0.5–1.9)

## 2022-02-15 MED ORDER — THIAMINE HCL 100 MG PO TABS
100.0000 mg | ORAL_TABLET | Freq: Every day | ORAL | Status: DC
  Administered 2022-02-16 – 2022-02-17 (×2): 100 mg via ORAL
  Filled 2022-02-15 (×2): qty 1

## 2022-02-15 MED ORDER — PANTOPRAZOLE 2 MG/ML SUSPENSION
40.0000 mg | Freq: Two times a day (BID) | ORAL | Status: DC
  Administered 2022-02-15 – 2022-02-17 (×4): 40 mg via ORAL
  Filled 2022-02-15 (×5): qty 20

## 2022-02-15 MED ORDER — RIFAXIMIN 200 MG PO TABS
200.0000 mg | ORAL_TABLET | Freq: Two times a day (BID) | ORAL | Status: DC
  Administered 2022-02-15 – 2022-02-17 (×4): 200 mg via ORAL
  Filled 2022-02-15 (×7): qty 1

## 2022-02-15 MED ORDER — FOLIC ACID 1 MG PO TABS
1.0000 mg | ORAL_TABLET | Freq: Every day | ORAL | Status: DC
  Administered 2022-02-16 – 2022-02-17 (×2): 1 mg via ORAL
  Filled 2022-02-15 (×2): qty 1

## 2022-02-15 MED ORDER — RIFAXIMIN 200 MG PO TABS
200.0000 mg | ORAL_TABLET | Freq: Two times a day (BID) | ORAL | Status: DC
  Administered 2022-02-15: 200 mg via ORAL
  Filled 2022-02-15: qty 1

## 2022-02-15 MED ORDER — LACTULOSE 10 GM/15ML PO SOLN: 30.0000 g | Freq: Three times a day (TID) | ORAL | Status: DC

## 2022-02-15 MED ORDER — RIFAXIMIN 200 MG PO TABS: 200.0000 mg | ORAL_TABLET | Freq: Two times a day (BID) | ORAL | Status: DC

## 2022-02-15 MED ORDER — LACTULOSE 10 GM/15ML PO SOLN
30.0000 g | Freq: Four times a day (QID) | ORAL | Status: DC
  Administered 2022-02-15: 30 g
  Filled 2022-02-15: qty 60

## 2022-02-15 MED ORDER — POLYETHYLENE GLYCOL 3350 17 G PO PACK
17.0000 g | PACK | Freq: Every day | ORAL | Status: DC | PRN
Start: 2022-02-15 — End: 2022-02-17

## 2022-02-15 MED ORDER — LIDOCAINE HCL 1 % IJ SOLN
INTRAMUSCULAR | Status: AC
  Administered 2022-02-15: 4 mL
  Filled 2022-02-15: qty 20

## 2022-02-15 MED ORDER — LACTULOSE 10 GM/15ML PO SOLN
30.0000 g | Freq: Three times a day (TID) | ORAL | Status: DC
  Administered 2022-02-15 – 2022-02-17 (×6): 30 g via ORAL
  Filled 2022-02-15 (×6): qty 60

## 2022-02-15 NOTE — Consult Note (Signed)
PHARMACY CONSULT NOTE - FOLLOW UP ? ?Pharmacy Consult for Electrolyte Monitoring and Replacement  ? ?Recent Labs: ?Potassium (mmol/L)  ?Date Value  ?02/15/2022 3.6  ? ?Magnesium (mg/dL)  ?Date Value  ?02/15/2022 2.2  ? ?Calcium (mg/dL)  ?Date Value  ?02/15/2022 7.9 (L)  ? ?Albumin (g/dL)  ?Date Value  ?02/15/2022 2.6 (L)  ? ?Phosphorus (mg/dL)  ?Date Value  ?02/15/2022 4.6  ? ?Sodium (mmol/L)  ?Date Value  ?02/15/2022 143  ? ?Corrected Calcium: 9.0 mg/dL ? ?Assessment: ?Pharmacy has been consulted to monitor and replace electrolytes in 56yo patient found unresponsive at home. He was placed on a mechanical ventilator s/t hepatic encephalopathy(ammonia level of 267 upon admission). Patient received 2L lactated ringers bolus in ED. Admitted to ICU. ? ?IVF: none currently ?Free water 30 mL every 4 hours ? ?Goal of Therapy:  ?Electrolytes WNL ? ?Plan:  ?K: 3.6; Scr 1.19>1.25>1.47 ?I/O: +687.3L; UOP ~0.65ml/k/h ?Corrected calcium 8.62 ?No current supplementation needed ?Will follow up with AM labs ? ?Wynelle Cleveland, PharmD ?Pharmacy Resident  ?02/15/2022 ?7:31 AM ? ? ?

## 2022-02-15 NOTE — Procedures (Signed)
Extubation Procedure Note ? ?Patient Details:   ?Name: Shawn Torres ?DOB: August 02, 1966 ?MRN: [id number] ?  ?Airway Documentation:  ?  ?Vent end date: 02/15/22 Vent end time: 1325  ? ?Evaluation ? O2 sats: stable throughout ?Complications: No apparent complications ?Patient did tolerate procedure well. ?Bilateral Breath Sounds: Rhonchi, Diminished ?  ?Yes ? ?Patient was extubated to a 4L [place]. Cuff leak was heard. No stridor was noted. Patient tolerated well. ? ?Darolyn Rua ?02/15/2022, 1:31 PM ? ?

## 2022-02-15 NOTE — Progress Notes (Signed)
? ?NAME:  Shawn Torres, MRN:  [id number], DOB:  February 22, 1966, LOS: 2 ?ADMISSION DATE:  02/13/2022, CONSULTATION DATE:  02/13/22 ?REFERRING MD:  Dr. Karma Greaser, CHIEF COMPLAINT:  Respiratory distress & unresponsive  ? ?History of Present Illness:  ?56 yo M presenting to [facility] ED via EMS from home after being found confused and on the floor of the bathroom by his girlfriend. History provided by the patient's daughter via telephone, no family bedside. Per daughter the patient had been slightly and intermittently confused over the last week or so, otherwise in his normal state of health. The patient's girlfriend also mentioned that he had been taking some of his medications late. Overnight on 3/25-3/26 the patient had been eating dinner with his girlfriend, got up to go to the bathroom and then the girlfriend heard a load bang. When she checked on the patient she found him on the bathroom floor confused. ? ?Per daughter the patient quit drinking alcohol 11 months ago. She denies any complaints of SOB, cough, nausea/vomiting, hematochezia, melena. She denies any tobacco use or any other recreational substances. ? ?ED course: ?Patient minimally responsive per ED staff, moaning with garbled speech. He was emergently intubated for airway protection. Degenerative changes seen on cervical spine, Dr. Karma Greaser called and alerted Dr. Izora Ribas to changes who will consult. ?Medications given: etomidate, LR bolus 1 L, narcan, rocuronium, lactulose 30 g ?Initial Vitals: 96, RR 18, NSR-76, BP-126/88, SpO2 100% on BVM ?Significant labs: (Labs/ Imaging personally reviewed) ?I, Domingo Pulse Rust-Chester, AGACNP-BC, personally viewed and interpreted this ECG. ?EKG Interpretation: Date: 02/13/22, EKG Time: 04:01, Rate: 85, Rhythm: NSR, QRS Axis:  LAD, Intervals: borderline prolonged Qtc, ST/T Wave abnormalities: non specific T wave inversions,  ?Narrative Interpretation: NSR ?Chemistry: Na+: 137, K+: 4.3, BUN/Cr.: 23/1.19, Serum CO2/ AG: 20/4,  Alk Phos: 155, Ammonia: 267 ?Hematology: WBC: 5.9, Hgb: 8.7, plt: 137 ?Lactic/ PCT: 4.1, COVID-19 & Influenza A/B: negative ?Blood Alcohol level: <10 ?ABG: 7.33/ 34/ 160/ 17.9 ?CXR 02/13/22: low volume chest with bilateral indistinct opacity that could be atelectasis or aspiration ?CT head wo contrast 02/13/22: no acute intracranial findings or depressed skull fractures ?CT cervical spine wo contrast 02/13/22: No evidence of cervical fractures or malalignment. Early degenerative features of the cervical spine, and a ?broad-based herniated disc at C6-7 deforming the ventral cord ?surface. ? ?PCCM consulted for admission due to Acute respiratory failure secondary to hepatic encephalopathy requiring mechanical ventilatory support. ? 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 ?Significant Hospital Events: ?Including procedures, antibiotic start and stop dates in addition to other pertinent events   ?03/26: Admit to ICU on mechanical ventilatory support for airway protection in the setting of hyperammonemia and acute hepatic encephalopathy ?03/27: Mental status currently precluding extubation.  Continue with Lactulose q4h ?03/28: Pt remains mechanically intubated on PS 5/5, however remains encephalopathic unable to follow commands  ? ?Interim History / Subjective:  ?No acute events overnight tolerating PS 5/5, however remains encephalopathic on propofol gtt '@30'  mcg/min.   ? ?Objective   ?Blood pressure (!) 97/56, pulse 67, temperature (!) 93.9 ?F (34.4 ?C), resp. rate 14, weight 82 kg, SpO2 100 %. ?   ?Vent Mode: PSV;CPAP ?FiO2 (%):  [21 %] 21 % ?PEEP:  [5 cmH20] 5 cmH20 ?Pressure Support:  [5 cmH20] 5 cmH20 ?Plateau Pressure:  [14 cmH20-16 cmH20] 16 cmH20  ? ?Intake/Output Summary (Last 24 hours) at 02/15/2022 7106 ?Last data filed at 02/15/2022 2694 ?Gross per 24 hour  ?  Intake 1627.67 ml  ?Output 3000 ml  ?Net -1372.33 ml  ? ?Filed Weights  ? 02/13/22 0350  02/13/22 0800 02/15/22 0500  ?Weight: 84.6 kg 82.7 kg 82 kg  ? ? ?Examination: ?General: Critically ill appearing male resting in bed sedated requiring mechanical ventilation, NAD ?HEENT: MM pink/moist, anicteric, atraumatic, neck supple, orally intubated ?Neuro: Sedated, unable to follow commands and not withdrawing from stimulation, PERRL ?CV: S1S2 RRR, NSR on monitor, no r/m/g, 2+ radial/2+ distal, trace bilateral upper extremity edema  ?Pulm: Clear throughout, even, non labored  ?GI: +BS x4, obese, soft, non distended  ?GU: Foley in place with clear yellow urine ?Skin: Skin tear LUE, scattered scabbed abrasions  ?Extremities: Warm/dry ? ?Resolved Hospital Problem list   ? ? ?Assessment & Plan:  ? ?Acute Hepatic Encephalopathy in the setting of hyperammonemia ?PMHx: Alcoholic Cirrhosis, Esophageal Varices  ? CT Head 03/26 revealed no acute intracranial abnormalities  ?- Maintain a RASS goal of 0 to -1 ?- Propofol as needed to maintain RASS goal ?- Avoid sedating medications as able ?- Daily wake up assessment ?- Ammonia level improved '@36' : lactulose 30 g decreased to tid  ?- Trend ammonia ?- Neuro checks Q 4 ?- Continue outpatient thiamine & folic acid ?- If mentation does not improve will obtain MRI Brain ? ?Acute Hypoxic / Hypercapnic Respiratory Failure secondary to acute hepatic encephalopathy  ?Mechanical intubation for airway protection  ?- Full vent support, implement lung protective strategies ?- Plateau pressures less than 30 cm H20 ?- Wean FiO2 & PEEP as tolerated to maintain O2 sats >92% ?- Follow intermittent Chest X-ray & ABG as needed ?- Spontaneous Breathing Trials when respiratory parameters met and mental status permits~mental status is currently precluding extubation ?- Implement VAP Bundle ?- Prn Bronchodilators ? ?Lactic Acidosis secondary to liver failure/respiratory failure~improving  ? PCT  1.89 ?- Monitor fever curve ?- Trend WBC's & Procalcitonin ?- Follow cultures as above ?- Continue  empiric ceftriaxone for possible Aspiration Pneumonia & possible SBP pending cultures & sensitivities ?- US Liver Doppler pending  ? ?Early degenerative features of the cervical spine, and a ?broad-based herniated disc at C6-7 deforming the ventral cord ?Surface (noted on CT cervical Spine) ?- Neurosurgery consulted appreciate input ~ NO surgical intervention recommended due to advanced liver disease  ? ?Type 2 Diabetes Mellitus ?recent HA1C 5.1 ?- Monitor CBG Q4 hours ?- SSI sensitive dosing ?- Target range while in ICU: 140-180 ?- Follow ICU hyper/hypo-glycemia protocol ? ?Best Practice (right click and "Reselect all SmartList Selections" daily)  ?Diet/type: Tube feeds  ?DVT prophylaxis: prophylactic heparin  ?GI prophylaxis: PPI ?Lines: N/A ?Foley:  Yes, and it is still needed ?Code Status:  full code ?Last date of multidisciplinary goals of care discussion [02/15/22] ? ?Labs   ?CBC: ?Recent Labs  ?Lab 02/13/22 ?0351 02/14/22 ?0431 02/15/22 ?0356  ?WBC 5.9 8.8 11.4*  ?NEUTROABS 3.4  --   --   ?HGB 8.7* 9.0* 9.2*  ?HCT 25.0* 26.2* 25.3*  ?MCV 95.4 97.8 98.4  ?PLT 137* 137* 105*  ? ? ?Basic Metabolic Panel: ?Recent Labs  ?Lab 02/13/22 ?0351 02/14/22 ?0431 02/14/22 ?1308 02/14/22 ?1642 02/15/22 ?0356  ?NA 137 145  --   --  143  ?K 4.3 3.8  --   --  3.6  ?CL 113* 113*  --   --  116*  ?CO2 20* 21*  --   --  22  ?GLUCOSE 159* 123*  --   --  208*  ?BUN 23* 25*  --   --  33*  ?CREATININE 1.19 1.25*  --   --  1.47*  ?CALCIUM 8.2* 8.3*  --   --  7.9*  ?MG 1.9 2.1 2.1 2.1 2.2  ?PHOS 3.5 4.6 4.0 4.3 4.6  ? ?GFR: ?Estimated Creatinine Clearance: 59.6 mL/min (A) (by C-G formula based on SCr of 1.47 mg/dL (H)). ?Recent Labs  ?Lab 02/13/22 ?0351 02/13/22 ?4715 02/14/22 ?8063 02/14/22 ?8685 02/15/22 ?0356  ?PROCALCITON <0.10  --  <0.10  --  1.89  ?WBC 5.9  --  8.8  --  11.4*  ?LATICACIDVEN 4.1* 3.6*  --  2.5*  --   ? ? ?Liver Function Tests: ?Recent Labs  ?Lab 02/13/22 ?0351 02/14/22 ?0431 02/15/22 ?0356  ?AST 38 42* 42*  ?ALT '17  19 18  ' ?ALKPHOS 155* 135* 136*  ?BILITOT 2.2* 1.8* 1.8*  ?PROT 6.2* 6.0* 5.8*  ?ALBUMIN 3.0* 2.8* 2.6*  ? ?Recent Labs  ?Lab 02/13/22 ?0351  ?LIPASE 41  ? ?Recent Labs  ?Lab 02/13/22 ?0351 02/13/22 ?1638 03

## 2022-02-15 NOTE — Procedures (Signed)
PROCEDURE SUMMARY: ? ?Successful ultrasound guided paracentesis from the LLQ quadrant.  ?Yielded 4.8 L of clear yellow fluid.  ?No immediate complications.  ?The patient tolerated the procedure well.  ? ?Specimen was sent for labs. ? ?EBL <  4mL ? ?The patient has required >/=2 paracenteses in a 30 day period and a formal evaluation by the [facility] Portal Hypertension [facility] has been arranged. ? ?Pattricia Boss D, PA-C ?02/15/2022, 12:29 PM ? ? ? ? ?

## 2022-02-15 NOTE — Progress Notes (Signed)
?  Progress Note  ? ?Date: 02/15/2022 ? ?Patient Name: Shawn Torres        ?MRN#: [id number] ? ?Review the patient?s clinical findings supports the diagnosis of:  ? ?AKI ? ?Marcelo Baldy, MD 02/15/22 10:32 AM   ? ? ? ? ?

## 2022-02-15 NOTE — Progress Notes (Signed)
? ?  Portal Hypertension Clinic Screening Evaluation ? ? ?Indication for evaluation: ?Shawn Torres is a 56 y.o. male undergoing preliminary evaluation in the [facility] due to recurrent ascites. ? ?Referring Physician/Established Gastroenterologist: Volney American, DO ([place]) ? ?Etiology of cirrhosis: EtOH ?Initially diagnosed: 2022 ?# of paracentesis in last month: >2, multiple sites ?# of paracentesis in last 2 months: >2, mulitple sites ?History of hepatic hydrothorax:  No ?History of hepatic encephalopathy: Yes ? ?Prior evaluation for liver transplant: No ?History of hepatocellular carcinoma: No ? ?Prior esophagogastroduodenoscopy/intervention: yes, 02/03/22 ?Current esophageal varices: Yes, grade II, decreasing in size ?Current gastric varices: No ?History of hematemesis: No ? ?Current diuretic regimen: furosemide 40 mg QD, spirinolactone 100 mg QD ?Current pharmacologic encephalopathy prophylaxis/treatment: Yes - intubation as inpatient, lactulose ? ?History of renal dysfunction: No ?History of hemodialysis: No ? ?History of cardiac dysfunction: No ? ?Other pertinent past medical history: Diabetes mellitus ? ? ?Imaging: ?Prior cross sectional imaging of portal system: ?CT AP 12/18/21: ?Non-occlusive portal thrombus, esophageal varices, no gastric varices or splenorenal shunt, moderate ascites. ? ?Echocardiogram: None ? ? ?Labs: ?02/15/22 ?Creatinine: 1.47 ?Total Bilirubin: 1.8 ?INR: 2.2 ?Sodium: 143 ?Albumin: 2.6 ?Ammonia: 36 ? ?Child-Pugh = 12 points, class C ?MELD = 21 (19.6 % estimated 3 month mortality) ?[place] Index of Post-TIPS Survival (FIPS) = 1.02 (overall survival at 1 month 88%, 3 months 67.1%, and 6 months 55.9%) ? ? ? ?Assessment: ?Shawn Torres is a 56 y.o. male with history of alcoholic cirrhosis with recurrent ascites (Child Pugh C, MELD 21).  After preliminary evaluation, this patient is a poor candidate for TIPS due to  severe encephalopathy requiring intubation, elevated MELD, and GI related care across multiple sites. ? ? ?Recommendation: ?No further follow up by Interventional Radiology due to severe encephalopathy, poor TIPS candidacy.  If his clinical picture drastically improves, he could be re-evaluated in the future. ? ? ? ?Electronically Signed: ?Suzette Battiest, MD ?02/15/2022, 4:02 PM ? ? ? ?

## 2022-02-15 NOTE — Plan of Care (Signed)
  Problem: Clinical Measurements: Goal: Ability to maintain clinical measurements within normal limits will improve Outcome: Not Progressing   Problem: Clinical Measurements: Goal: Will remain free from infection Outcome: Not Progressing   Problem: Clinical Measurements: Goal: Diagnostic test results will improve Outcome: Not Progressing   Problem: Clinical Measurements: Goal: Respiratory complications will improve Outcome: Not Progressing   Problem: Clinical Measurements: Goal: Cardiovascular complication will be avoided Outcome: Not Progressing   

## 2022-02-15 NOTE — Progress Notes (Signed)
?  Progress Note  ? ?Date: 02/15/2022 ? ?Patient Name: Shawn Torres        ?MRN#: [id number] ? ? ?The diagnosis of sepsis   ? ?Is not clinically confirmed but not ruled out. ? ?Bennie Pierini, MD 02/15/22 10:31 AM   ? ? ? ? ?

## 2022-02-16 ENCOUNTER — Inpatient Hospital Stay: Payer: Self-pay

## 2022-02-16 LAB — GLUCOSE, CAPILLARY
Glucose-Capillary: 97 mg/dL (ref 70–99)
Glucose-Capillary: 84 mg/dL (ref 70–99)
Glucose-Capillary: 75 mg/dL (ref 70–99)
Glucose-Capillary: 185 mg/dL — ABNORMAL HIGH (ref 70–99)
Glucose-Capillary: 193 mg/dL — ABNORMAL HIGH (ref 70–99)
Glucose-Capillary: 165 mg/dL — ABNORMAL HIGH (ref 70–99)

## 2022-02-16 LAB — CULTURE, RESPIRATORY W GRAM STAIN: Culture: NORMAL

## 2022-02-16 LAB — CBC
HCT: 23.5 % — ABNORMAL LOW (ref 39.0–52.0)
Hemoglobin: 8.3 g/dL — ABNORMAL LOW (ref 13.0–17.0)
MCH: 33.6 pg (ref 26.0–34.0)
MCHC: 35.3 g/dL (ref 30.0–36.0)
MCV: 95.1 fL (ref 80.0–100.0)
Platelets: 111 10*3/uL — ABNORMAL LOW (ref 150–400)
RBC: 2.47 MIL/uL — ABNORMAL LOW (ref 4.22–5.81)
RDW: 17 % — ABNORMAL HIGH (ref 11.5–15.5)
WBC: 8.8 10*3/uL (ref 4.0–10.5)
nRBC: 0 % (ref 0.0–0.2)

## 2022-02-16 LAB — COMPREHENSIVE METABOLIC PANEL
ALT: 18 U/L (ref 0–44)
AST: 35 U/L (ref 15–41)
Albumin: 2.3 g/dL — ABNORMAL LOW (ref 3.5–5.0)
Alkaline Phosphatase: 123 U/L (ref 38–126)
Anion gap: 7 (ref 5–15)
BUN: 31 mg/dL — ABNORMAL HIGH (ref 6–20)
CO2: 20 mmol/L — ABNORMAL LOW (ref 22–32)
Calcium: 7.6 mg/dL — ABNORMAL LOW (ref 8.9–10.3)
Chloride: 113 mmol/L — ABNORMAL HIGH (ref 98–111)
Creatinine, Ser: 1.11 mg/dL (ref 0.61–1.24)
GFR, Estimated: >60 mL/min (ref >60)
Glucose, Bld: 88 mg/dL (ref 70–99)
Potassium: 3 mmol/L — ABNORMAL LOW (ref 3.5–5.1)
Sodium: 140 mmol/L (ref 135–145)
Total Bilirubin: 1.8 mg/dL — ABNORMAL HIGH (ref 0.3–1.2)
Total Protein: 5 g/dL — ABNORMAL LOW (ref 6.5–8.1)

## 2022-02-16 LAB — PROTIME-INR
INR: 2.1 — ABNORMAL HIGH (ref 0.8–1.2)
Prothrombin Time: 23.4 seconds — ABNORMAL HIGH (ref 11.4–15.2)

## 2022-02-16 LAB — PHOSPHORUS: Phosphorus: 4 mg/dL (ref 2.5–4.6)

## 2022-02-16 MED ORDER — ENSURE MAX PROTEIN PO LIQD
11.0000 [oz_av] | Freq: Every day | ORAL | Status: DC
Start: 2022-02-16 — End: 2022-02-17
  Administered 2022-02-16: 11 [oz_av] via ORAL
  Filled 2022-02-16: qty 330

## 2022-02-16 MED ORDER — INSULIN ASPART 100 UNIT/ML IJ SOLN: 0.0000 [IU] | Freq: Every day | INTRAMUSCULAR | Status: DC

## 2022-02-16 MED ORDER — POTASSIUM CHLORIDE 20 MEQ PO PACK
40.0000 meq | PACK | ORAL | Status: AC
  Administered 2022-02-16 (×2): 40 meq via ORAL
  Filled 2022-02-16 (×2): qty 2

## 2022-02-16 MED ORDER — INSULIN ASPART 100 UNIT/ML IJ SOLN
0.0000 [IU] | Freq: Three times a day (TID) | INTRAMUSCULAR | Status: DC
  Administered 2022-02-16 – 2022-02-17 (×3): 2 [IU] via SUBCUTANEOUS
  Administered 2022-02-17: 1 [IU] via SUBCUTANEOUS
  Filled 2022-02-16 (×4): qty 1

## 2022-02-16 MED ORDER — GLUCERNA SHAKE PO LIQD
237.0000 mL | Freq: Two times a day (BID) | ORAL | Status: DC
Start: 2022-02-16 — End: 2022-02-17
  Administered 2022-02-16 – 2022-02-17 (×2): 237 mL via ORAL

## 2022-02-16 MED ORDER — ALBUMIN HUMAN 25 % IV SOLN
50.0000 g | Freq: Once | INTRAVENOUS | Status: AC
  Administered 2022-02-16: 50 g via INTRAVENOUS
  Filled 2022-02-16: qty 200

## 2022-02-16 NOTE — Progress Notes (Signed)
? ?NAME:  Shawn Torres, MRN:  [id number], DOB:  October 08, 1966, LOS: 3 ?ADMISSION DATE:  02/13/2022, CONSULTATION DATE:  02/13/22 ?REFERRING MD:  Dr. Karma Greaser, CHIEF COMPLAINT:  Respiratory distress & unresponsive  ? ?History of Present Illness:  ?56 yo M presenting to [facility] ED via EMS from home after being found confused and on the floor of the bathroom by his girlfriend. History provided by the patient's daughter via telephone, no family bedside. Per daughter the patient had been slightly and intermittently confused over the last week or so, otherwise in his normal state of health. The patient's girlfriend also mentioned that he had been taking some of his medications late. Overnight on 3/25-3/26 the patient had been eating dinner with his girlfriend, got up to go to the bathroom and then the girlfriend heard a load bang. When she checked on the patient she found him on the bathroom floor confused. ? ?Per daughter the patient quit drinking alcohol 11 months ago. She denies any complaints of SOB, cough, nausea/vomiting, hematochezia, melena. She denies any tobacco use or any other recreational substances. ? ?ED course: ?Patient minimally responsive per ED staff, moaning with garbled speech. He was emergently intubated for airway protection. Degenerative changes seen on cervical spine, Dr. Karma Greaser called and alerted Dr. Izora Ribas to changes who will consult. ?Medications given: etomidate, LR bolus 1 L, narcan, rocuronium, lactulose 30 g ?Initial Vitals: 96, RR 18, NSR-76, BP-126/88, SpO2 100% on BVM ?Significant labs: (Labs/ Imaging personally reviewed) ?I, Domingo Pulse Rust-[place], AGACNP-BC, personally viewed and interpreted this ECG. ?EKG Interpretation: Date: 02/13/22, EKG Time: 04:01, Rate: 85, Rhythm: NSR, QRS Axis:  LAD, Intervals: borderline prolonged Qtc, ST/T Wave abnormalities: non specific T wave inversions,  ?Narrative Interpretation: NSR ?Chemistry: Na+: 137, K+: 4.3, BUN/Cr.: 23/1.19, Serum CO2/ AG: 20/4,  Alk Phos: 155, Ammonia: 267 ?Hematology: WBC: 5.9, Hgb: 8.7, plt: 137 ?Lactic/ PCT: 4.1, COVID-19 & Influenza A/B: negative ?Blood Alcohol level: <10 ?ABG: 7.33/ 34/ 160/ 17.9 ?CXR 02/13/22: low volume chest with bilateral indistinct opacity that could be atelectasis or aspiration ?CT head wo contrast 02/13/22: no acute intracranial findings or depressed skull fractures ?CT cervical spine wo contrast 02/13/22: No evidence of cervical fractures or malalignment. Early degenerative features of the cervical spine, and a ?broad-based herniated disc at C6-7 deforming the ventral cord ?surface. ? ?PCCM consulted for admission due to Acute respiratory failure secondary to hepatic encephalopathy requiring mechanical ventilatory support. ? 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 ?Significant Hospital Events: ?Including procedures, antibiotic start and stop dates in addition to other pertinent events   ?03/26: Admit to ICU on mechanical ventilatory support for airway protection in the setting of hyperammonemia and acute hepatic encephalopathy ?03/27: Mental status currently precluding extubation.  Continue with Lactulose q4h ?03/28: Pt remains mechanically intubated on PS 5/5, however remains encephalopathic unable to follow commands  ?03/29: Successfully liberated from mechanical ventilation.  S/p paracentesis with removal of 4.8L of fluid  ? ?Interim History / Subjective:  ?No acute events overnight remains on RA with O2 sats upper 90's.  Pt does complain of right forearm following a fall prior to admission  ? ?Objective   ?Blood pressure 101/66, pulse 69, temperature 98.7 ?F (37.1 ?C), temperature source Oral, resp. rate 16, weight 82 kg, SpO2 98 %. ?   ?Vent Mode: PSV;CPAP ?FiO2 (%):  [21 %] 21 % ?PEEP:  [5 cmH20] 5 cmH20 ?Pressure Support:  [5 cmH20] 5 cmH20  ? ?Intake/Output  Summary (Last 24 hours) at 02/16/2022 1011 ?Last data filed at 02/16/2022  0900 ?Gross per 24 hour  ?Intake 850 ml  ?Output 665 ml  ?Net 185 ml  ? ?Filed Weights  ? 02/13/22 0350 02/13/22 0800 02/15/22 0500  ?Weight: 84.6 kg 82.7 kg 82 kg  ? ? ?Examination: ?General: Critically ill appearing male resting, NAD on RA  ?HEENT: Supple, no JVD  ?Neuro: Alert and oriented, follows commands, PERRLA  ?CV: S1S2 RRR, NSR on monitor, no r/m/g, 2+ radial/2+ distal, trace bilateral upper extremity edema  ?Pulm: Clear throughout, even, non labored  ?GI: +BS x4, obese/ascites, soft, non distended  ?GU: Voiding  ?Skin: Skin tear LUE, scattered scabbed abrasions  ?Extremities: Warm/dry ? ?Resolved Hospital Problem list   ?Mechanical Intubation  ? ?Assessment & Plan:  ? ?Acute Hepatic Encephalopathy in the setting of hyperammonemia~improving  ?PMHx: Alcoholic Cirrhosis, Esophageal Varices  ? CT Head 03/26 revealed no acute intracranial abnormalities  ?- Continue lactulose 30 g tid and rifaximin  ?- Trend ammonia ?- Continue outpatient thiamine & folic acid ?- Frequent orientation  ? ?Acute Hypoxic / Hypercapnic Respiratory Failure secondary to acute hepatic encephalopathy  ?- prn supplemental O2 for dyspnea and/or hypoxia  ? ?Lactic Acidosis secondary to liver failure/respiratory failure~improving  ? PCT  1.89 ?- Monitor fever curve ?- Trend WBC's & Procalcitonin ?- Follow cultures as above ?- Continue empiric ceftriaxone for possible aspiration pneumonia and possible SBP for 7 day course  ?- US Liver Doppler pending  ? ?Ascites s/p paracentesis 03/28 ?- Paracentesis lab results pending  ?- Will administer albumin 50g x1 dose  ? ?Early degenerative features of the cervical spine, and a ?broad-based herniated disc at C6-7 deforming the ventral cord ?Surface (noted on CT cervical Spine) ?Right upper extremity pain following a fall  ?- Neurosurgery consulted appreciate input ~ NO surgical intervention recommended due to advanced liver disease  ?- Will consult PT/OT  ?- Right forearm x-ray pending  ? ?Type 2  Diabetes Mellitus ?recent HA1C 5.1 ?- Monitor CBG Q4 hours ?- SSI sensitive dosing ?- Target range while in ICU: 140-180 ?- Follow ICU hyper/hypo-glycemia protocol ? ?Best Practice (right click and "Reselect all SmartList Selections" daily)  ?Diet/type: Carb modified  ?DVT prophylaxis: prophylactic heparin  ?GI prophylaxis: PPI ?Lines: N/A ?Foley:  No  ?Code Status:  full code ?Last date of multidisciplinary goals of care discussion [02/16/22] ? ?Discussed plan of care with pt and all questions were answered. ?Labs   ?CBC: ?Recent Labs  ?Lab 02/13/22 ?0351 02/14/22 ?0431 02/15/22 ?0356 02/16/22 ?5176  ?WBC 5.9 8.8 11.4* 8.8  ?NEUTROABS 3.4  --   --   --   ?HGB 8.7* 9.0* 9.2* 8.3*  ?HCT 25.0* 26.2* 25.3* 23.5*  ?MCV 95.4 97.8 98.4 95.1  ?PLT 137* 137* 105* 111*  ? ? ?Basic Metabolic Panel: ?Recent Labs  ?Lab 02/13/22 ?0351 02/14/22 ?0431 02/14/22 ?1308 02/14/22 ?1642 02/15/22 ?0356 02/15/22 ?1703 02/16/22 ?1607  ?NA 137 145  --   --  143  --  140  ?K 4.3 3.8  --   --  3.6  --  3.0*  ?CL 113* 113*  --   --  116*  --  113*  ?CO2 20* 21*  --   --  22  --  20*  ?GLUCOSE 159* 123*  --   --  208*  --  88  ?BUN 23* 25*  --   --  33*  --  31*  ?CREATININE 1.19 1.25*  --   --  1.47*  --  1.11  ?CALCIUM 8.2* 8.3*  --   --  7.9*  --  7.6*  ?MG 1.9 2.1 2.1 2.1 2.2 2.3  --   ?PHOS 3.5 4.6 4.0 4.3 4.6 4.6 4.0  ? ?GFR: ?Estimated Creatinine Clearance: 78.9 mL/min (by C-G formula based on SCr of 1.11 mg/dL). ?Recent Labs  ?Lab 02/13/22 ?0351 02/13/22 ?3475 02/14/22 ?8307 02/14/22 ?4600 02/15/22 ?0356 02/15/22 ?1015 02/16/22 ?2984  ?PROCALCITON <0.10  --  <0.10  --  1.89  --   --   ?WBC 5.9  --  8.8  --  11.4*  --  8.8  ?LATICACIDVEN 4.1* 3.6*  --  2.5*  --  2.8*  --   ? ? ?Liver Function Tests: ?Recent Labs  ?Lab 02/13/22 ?0351 02/14/22 ?0431 02/15/22 ?0356 02/16/22 ?7308  ?AST 38 42* 42* 35  ?ALT '17 19 18 18  ' ?ALKPHOS 155* 135* 136* 123  ?BILITOT 2.2* 1.8* 1.8* 1.8*  ?PROT 6.2* 6.0* 5.8* 5.0*  ?ALBUMIN 3.0* 2.8* 2.6* 2.3*  ? ?Recent  Labs  ?Lab 02/13/22 ?0351  ?LIPASE 41  ? ?Recent Labs  ?Lab 02/13/22 ?0351 02/13/22 ?1638 02/15/22 ?0356  ?AMMONIA 267* 132* 36*  ? ? ?ABG ?   ?Component Value Date/Time  ? PHART 7.33 (L) 02/13/2022 0521  ? PCO2AR

## 2022-02-16 NOTE — Consult Note (Signed)
PHARMACY CONSULT NOTE - FOLLOW UP ? ?Pharmacy Consult for Electrolyte Monitoring and Replacement  ? ?Recent Labs: ?Potassium (mmol/L)  ?Date Value  ?02/16/2022 3.0 (L)  ? ?Magnesium (mg/dL)  ?Date Value  ?02/15/2022 2.3  ? ?Calcium (mg/dL)  ?Date Value  ?02/16/2022 7.6 (L)  ? ?Albumin (g/dL)  ?Date Value  ?02/16/2022 2.3 (L)  ? ?Phosphorus (mg/dL)  ?Date Value  ?02/16/2022 4.0  ? ?Sodium (mmol/L)  ?Date Value  ?02/16/2022 140  ? ?Corrected Calcium: 8.96 mg/dL ? ?Assessment: ?Pharmacy has been consulted to monitor and replace electrolytes in 55yo patient found unresponsive at home. He was placed on a mechanical ventilator s/t hepatic encephalopathy(ammonia level of 267 upon admission). Patient received 2L lactated ringers bolus in ED. Admitted to ICU. Now taking oral medications. ? ?IVF: none currently ? ?Goal of Therapy:  ?Electrolytes WNL ? ?Plan:  ?K: 3.0. Supplement with Kcl 40 mEq x 2 ?I/O: +770mL; UOP ~0.8ml/k/h ?Will follow up with AM labs ? ? ?Jaynie Bream, PharmD ?Pharmacy Resident  ?02/16/2022 ?7:00 AM ? ? ?

## 2022-02-16 NOTE — Progress Notes (Addendum)
Patient allowed to transport to [place] without Rn per MD [employer] authorization. Patient transported to [place] for thoracentesis. Vital signs stable and patient AO4 at time of transport.  ?

## 2022-02-16 NOTE — Plan of Care (Signed)
  Problem: Clinical Measurements: Goal: Ability to maintain clinical measurements within normal limits will improve Outcome: Not Progressing   Problem: Clinical Measurements: Goal: Will remain free from infection Outcome: Not Progressing   Problem: Clinical Measurements: Goal: Diagnostic test results will improve Outcome: Not Progressing   Problem: Clinical Measurements: Goal: Respiratory complications will improve Outcome: Not Progressing   Problem: Clinical Measurements: Goal: Cardiovascular complication will be avoided Outcome: Not Progressing   

## 2022-02-16 NOTE — Progress Notes (Signed)
Nutrition Follow-up ? ?DOCUMENTATION CODES:  ? ?Non-severe (moderate) malnutrition in context of chronic illness ? ?INTERVENTION:  ? ?-Glucerna Shake po BID, each supplement provides 220 kcal and 10 grams of protein  ?-Ensure Max po daily, each supplement provides 150 kcal and 30 grams of protein ?-MVI with minerals daily ? ?NUTRITION DIAGNOSIS:  ? ?Moderate Malnutrition related to chronic illness (cirrhosis) as evidenced by mild fat depletion, mild muscle depletion, moderate muscle depletion. ? ?Ongoing ? ?GOAL:  ? ?Patient will meet greater than or equal to 90% of their needs ? ?Progressing  ? ?MONITOR:  ? ?Vent status, Labs, Weight trends, TF tolerance, Skin, I & O's ? ?REASON FOR ASSESSMENT:  ? ?Consult, Ventilator ?Enteral/tube feeding initiation and management ? ?ASSESSMENT:  ? ?[name] is admitted for acute hepatic encephalopathy with significant hyperammonemia (NH3 267) in the setting of alcoholic cirrhosis with known esophageal varices (s/p banding on 01/20/22) and portal vein thrombus requiring intubation for airway protection. He follows with [facility] for cirrhosis. He last underwent large volume paracentesis on 3/23 with 9.85 L of ascitic fluid removed; fluid not sent for testing. Medication adherence to lactulose has been a known issue. MELD score 18 on arrival. He was last admitted from 2/10 - 2/13 for hepatic encephalopathy but did not require ICU level care during that admission. ? ?3/29- extubated ? ?Reviewed I/O's: -50 ml x 24 hours and +637 ml since admission ? ?UOP: 715 ml x 24 hours ? ?Case discussed with MD, RN, and during ICU rounds. Pt is alert and oriented this morning and tolerating clear liquids. Diet was just advanced. Plan for PT and OT consults. Pt on room air and may transfer to floor later today.  ?  ?Medications reviewed and include folic acid, lactulose, and thiamine. ? ?Labs reviewed: K: 3.0, CBGS: 75-185 (inpatient orders for glycemic control are 0-5 units insulin  aspart daily at bedtime and 0-9 units insulin aspart TID with meals).   ? ?Diet Order:   ?Diet Order   ? ?       ?  Diet Carb Modified Fluid consistency: Thin; Room service appropriate? Yes  Diet effective now       ?  ? ?  ?  ? ?  ? ? ?EDUCATION NEEDS:  ? ?Not appropriate for education at this time ? ?Skin:  Skin Assessment: Skin Integrity Issues: ?Skin Integrity Issues:: Incisions ?Incisions: closed rt and lt lower flank ?Other: wound to lt lower posterior arm ? ?Last BM:  02/16/22 (type 7) ? ?Height:  ? ?Ht Readings from Last 1 Encounters:  ?02/03/22 5\' 7"  (1.702 m)  ? ? ?Weight:  ? ?Wt Readings from Last 1 Encounters:  ?02/15/22 82 kg  ? ? ?Ideal Body Weight:  67.3 kg ? ?BMI:  Body mass index is 28.31 kg/m?. ? ?Estimated Nutritional Needs:  ? ?Kcal:  2000-2200 ? ?Protein:  100-115 grams ? ?Fluid:  > 2 L ? ? ? ?02/17/22, RD, LDN, CDCES ?Registered Dietitian II ?Certified Diabetes Care and Education Specialist ?Please refer to [facility] for RD and/or RD on-call/weekend/after hours pager  ?

## 2022-02-16 NOTE — Evaluation (Signed)
Physical Therapy Evaluation ?Patient Details ?Name: Shawn Torres ?MRN: [id number] ?DOB: Oct 03, 1966 ?Today's Date: 02/16/2022 ? ?History of Present Illness ? 56 y.o. hispanic male with medical history significant for alcoholic liver cirrhosis and ascites, type 2 diabetes mellitus, who presented to emergency room with altered mental status and fall at home. MD assessment includes: Hepatic encephalopathy, Alcohol liver cirrhosis with ascites s/p paracentesis.  Pt hospitalized for similar earlier this year.  ?Clinical Impression ? Pt did well with PT exam, initially did not feel as though he could do a lot but ultimately was able to ambulate ~175 ft w/ walker and stable vitals.  Pt typically does not need AD and is able to be active, so he is not at his baseline but ultimately he did quite well.  Plan on ambulation with SPC next visit, pt has been admitted in the past with similar presentation and typically is able to return home without PT follow up; he appears weaker than his typical but suspect with continued improvement here he may not need f/u at d/c, TBD.   ?   ? ?Recommendations for follow up therapy are one component of a multi-disciplinary discharge planning process, led by the attending physician.  Recommendations may be updated based on patient status, additional functional criteria and insurance authorization. ? ?Follow Up Recommendations Follow physician's recommendations for discharge plan and follow up therapies ? ?  ?Assistance Recommended at Discharge Set up Supervision/Assistance  ?Patient can return home with the following ? A little help with walking and/or transfers;Assistance with cooking/housework;Assist for transportation ? ?  ?Equipment Recommendations  (has walker at home)  ?Recommendations for Other Services ?    ?  ?Functional Status Assessment Patient has had a recent decline in their functional status and demonstrates the ability to make significant improvements in function in a reasonable  and predictable amount of time.  ? ?  ?Precautions / Restrictions Precautions ?Precautions: Fall ?Restrictions ?Weight Bearing Restrictions: No  ? ?  ? ?Mobility ? Bed Mobility ?Overal bed mobility: Modified Independent ?  ?  ?  ?  ?  ?  ?General bed mobility comments: Pt was able to get himself up to sitting EOB w/o direct assist, minimal hesitation ?  ? ?Transfers ?Overall transfer level: Modified independent ?Equipment used: Rolling walker (2 wheels) ?  ?  ?  ?  ?  ?  ?  ?General transfer comment: Pt able to rise without excessive UE use and with good overall safety and confidence ?  ? ?Ambulation/Gait ?Ambulation/Gait assistance: Supervision ?Gait Distance (Feet): 175 Feet ?Assistive device: Rolling walker (2 wheels) ?  ?  ?  ?  ?General Gait Details: Pt able to ambulate with consistent speed and good relative confidence.  He does not typically need an AD, but certainly benefited from it today; he did have a few instances where he ran into unmoving obstacles but did not have any LOBs or excessive fatigue (O2 remains in the high 90s, HR in the 80s).  Pt initially did not feel like he could do much walking at all but ultimately did well, not yet at baseline, plan on trialing with [facility] next session. ? ?Stairs ?  ?  ?  ?  ?  ? ?Wheelchair Mobility ?  ? ?Modified Rankin (Stroke Patients Only) ?  ? ?  ? ?Balance Overall balance assessment: Modified Independent (reliant on walker in standing, no AD at baseline) ?  ?  ?  ?  ?  ?  ?  ?  ?  ?  ?  ?  ?  ?  ?  ?  ?  ?  ?   ? ? ? ?  Pertinent Vitals/Pain Pain Assessment ?Pain Assessment: Faces ?Faces Pain Scale: Hurts little more ?Pain Location: L elbow from recent fall  ? ? ?Home Living Family/patient expects to be discharged to:: Private residence ?Living Arrangements: Spouse/significant other;Children ?Available Help at Discharge: Available 24 hours/day;Family;Friend(s) ?Type of Home: Mobile home ?Home Access: Stairs to enter ?Entrance Stairs-Rails: Can reach  both;Right;Left ?Entrance Stairs-Number of Steps: 3 ?  ?Home Layout: One level ?Home Equipment: Agricultural consultant (2 wheels) ?   ?  ?Prior Function Prior Level of Function : Independent/Modified Independent ?  ?  ?  ?  ?  ?  ?Mobility Comments: Pt normally able to be active with small yard/maintanence jobs around the house. Multiple falls in the last 6 months (pt does not remember falls) all related to preiods of "confusion". ?  ?  ? ? ?Hand Dominance  ?   ? ?  ?Extremity/Trunk Assessment  ? Upper Extremity Assessment ?Upper Extremity Assessment: Overall WFL for tasks assessed;Generalized weakness ?  ? ?Lower Extremity Assessment ?Lower Extremity Assessment: Overall WFL for tasks assessed;Generalized weakness ?  ? ?   ?Communication  ? Communication: Prefers language other than [place];Interpreter utilized;Other (comment) (Spanish tele-interpreter [place] [phone])  ?Cognition Arousal/Alertness: Awake/alert ?Behavior During Therapy: [facility] for tasks assessed/performed ?Overall Cognitive Status: Within Functional Limits for tasks assessed ?  ?  ?  ?  ?  ?  ?  ?  ?  ?  ?  ?  ?  ?  ?  ?  ?  ?  ?  ? ?  ?General Comments   ? ?  ?Exercises    ? ?Assessment/Plan  ?  ?PT Assessment Patient needs continued PT services  ?PT Problem List Decreased activity tolerance;Decreased balance;Decreased mobility;Decreased safety awareness;Decreased knowledge of use of DME;Decreased strength;Pain ? ?   ?  ?PT Treatment Interventions DME instruction;Gait training;Stair training;Functional mobility training;Therapeutic exercise;Therapeutic activities;Balance training;Neuromuscular re-education;Patient/family education   ? ?PT Goals (Current goals can be found in the Care Plan section)  ?Acute Rehab PT Goals ?Patient Stated Goal: Go home ?PT Goal Formulation: With patient ?Time For Goal Achievement: 03/02/22 ?Potential to Achieve Goals: Good ? ?  ?Frequency Min 2X/week ?  ? ? ?Co-evaluation   ?  ?  ?  ?  ? ? ?  ?AM-PAC PT "6 Clicks" Mobility   ?Outcome Measure Help needed turning from your back to your side while in a flat bed without using bedrails?: None ?Help needed moving from lying on your back to sitting on the side of a flat bed without using bedrails?: None ?Help needed moving to and from a bed to a chair (including a wheelchair)?: None ?Help needed standing up from a chair using your arms (e.g., wheelchair or bedside chair)?: None ?Help needed to walk in hospital room?: A Little ?Help needed climbing 3-5 steps with a railing? : A Little ?6 Click Score: 22 ? ?  ?End of Session Equipment Utilized During Treatment: Gait belt ?Activity Tolerance: Patient tolerated treatment well ?Patient left: in chair;with call bell/phone within reach;with nursing/sitter in room ?Nurse Communication: Mobility status ?PT Visit Diagnosis: Muscle weakness (generalized) (M62.81);Difficulty in walking, not elsewhere classified (R26.2);Unsteadiness on feet (R26.81);Pain ?Pain - Right/Left: Right ?Pain - part of body: Arm ?  ? ?Time: 9741-6384 ?PT Time Calculation (min) (ACUTE ONLY): 27 min ? ? ?Charges:   PT Evaluation ?$PT Eval Low Complexity: 1 Low ?PT Treatments ?$Gait Training: 8-22 mins ?  ?   ? ? ?Malachi Pro, DPT ?02/16/2022, 1:38 PM ? ?

## 2022-02-16 NOTE — Evaluation (Signed)
Occupational Therapy Evaluation ?Patient Details ?Name: Shawn Torres ?MRN: [id number] ?DOB: 05/25/1966 ?Today's Date: 02/16/2022 ? ? ?History of Present Illness 56 y.o. hispanic male with medical history significant for alcoholic liver cirrhosis and ascites, type 2 diabetes mellitus, who presented to emergency room with altered mental status and fall at home. MD assessment includes: Hepatic encephalopathy, Alcohol liver cirrhosis with ascites s/p paracentesis.  Pt hospitalized for similar earlier this year.  ? ?Clinical Impression ?  ?Shawn Torres was seen for OT evaluation this date. [employer] Video Interpreter utilized t/o session (Interpreter ID: [id number]) Prior to hospital admission, pt was active and independent. Pt lives with his partner in a mobile home. He endorses staying active, working in his yard, and assisting with household chores at baseline. Currently pt demonstrates impairments as described below (See OT problem list) which functionally limit his ability to perform ADL/self-care tasks. Pt currently requires SUPERVISION for safety with functional mobility using a RW as well as MIN A for more exertional ADL management including LB dressing and bathing tasks.  Pt would benefit from skilled OT services to address noted impairments and functional limitations (see below for any additional details) in order to maximize safety and independence while minimizing falls risk and caregiver burden. Do not anticipate the need for follow-up OT services upon acute hospital DC.  ?  ?   ? ?Recommendations for follow up therapy are one component of a multi-disciplinary discharge planning process, led by the attending physician.  Recommendations may be updated based on patient status, additional functional criteria and insurance authorization.  ? ?Follow Up Recommendations ? No OT follow up  ?  ?Assistance Recommended at Discharge PRN  ?Patient can return home with the following   ? ?  ?Functional Status Assessment ? Patient has  had a recent decline in their functional status and demonstrates the ability to make significant improvements in function in a reasonable and predictable amount of time.  ?Equipment Recommendations ? None recommended by OT  ?  ?Recommendations for Other Services   ? ? ?  ?Precautions / Restrictions Precautions ?Precautions: Fall ?Restrictions ?Weight Bearing Restrictions: No  ? ?  ? ?Mobility Bed Mobility ?  ?  ?  ?  ?  ?  ?  ?General bed mobility comments: Deferred. Pt in recliner at start/end of session. ?  ? ?Transfers ?Overall transfer level: Needs assistance ?Equipment used: Rolling walker (2 wheels) ?Transfers: Sit to/from Stand, Bed to chair/wheelchair/BSC ?Sit to Stand: Supervision ?  ?Squat pivot transfers: Supervision ?  ?  ?  ?General transfer comment: Supervision for safety and cueing for safe use of RW t/o session. ?  ? ?  ?Balance Overall balance assessment: Modified Independent (reliant on RW for stability during session.) ?  ?  ?  ?  ?  ?  ?  ?  ?  ?  ?  ?  ?  ?  ?  ?  ?  ?  ?   ? ?ADL either performed or assessed with clinical judgement  ? ?ADL Overall ADL's : Needs assistance/impaired ?  ?  ?  ?  ?  ?  ?  ?  ?  ?  ?  ?  ?  ?  ?  ?  ?  ?  ?  ?General ADL Comments: Pt functionally limited by generalized weakness, decreased activity tolerance and impaired balance. He requires supervision for safety with functional mobility and simulated toilet transfer using RW for stability. Anticipate MIN A for LB ADL Management  from STS 2/2 decreased LB access.  ? ? ? ?Vision Patient Visual Report: No change from baseline ?   ?   ?Perception   ?  ?Praxis   ?  ? ?Pertinent Vitals/Pain Pain Assessment ?Pain Assessment: Faces ?Pain Score: 7  ?Pain Location: L elbow from recent fall ?Pain Intervention(s): Limited activity within patient's tolerance, Monitored during session, Repositioned, Patient requesting pain meds-RN notified  ? ? ? ?Hand Dominance Right ?  ?Extremity/Trunk Assessment Upper Extremity  Assessment ?Upper Extremity Assessment: Generalized weakness;Overall [facility] for tasks assessed ?  ?Lower Extremity Assessment ?Lower Extremity Assessment: Overall WFL for tasks assessed;Generalized weakness ?  ?Cervical / Trunk Assessment ?Cervical / Trunk Assessment: Normal ?  ?Communication Communication ?Communication: Prefers language other than [place];Interpreter utilized;Other (comment) ?  ?Cognition Arousal/Alertness: Awake/alert ?Behavior During Therapy: [facility] for tasks assessed/performed ?Overall Cognitive Status: Within Functional Limits for tasks assessed ?  ?  ?  ?  ?  ?  ?  ?  ?  ?  ?  ?  ?  ?  ?  ?  ?  ?  ?  ?General Comments    ? ?  ?Exercises Other Exercises ?Other Exercises: Pt educated on role of OT in acute setting, safe use of AE/DME for ADL management and routines modificcations to support safety and functional independence during ADL management. OT facilitated simulated toilet transfer to [facility] with education on safety, falls prevention, and safe use of RW t/o session. ?  ?Shoulder Instructions    ? ? ?Home Living Family/patient expects to be discharged to:: Private residence ?Living Arrangements: Spouse/significant other;Children ?Available Help at Discharge: Available 24 hours/day;Family;Friend(s) ?Type of Home: Mobile home ?Home Access: Stairs to enter ?Entrance Stairs-Number of Steps: 3 ?Entrance Stairs-Rails: Can reach both;Right;Left ?Home Layout: One level ?  ?  ?Bathroom Shower/Tub: Tub/shower unit ?  ?Bathroom Toilet: Standard ?  ?  ?Home Equipment: Agricultural consultant (2 wheels) ?  ?  ?  ? ?  ?Prior Functioning/Environment Prior Level of Function : Independent/Modified Independent ?  ?  ?  ?  ?  ?  ?Mobility Comments: Pt normally able to be active with small yard/maintanence jobs around the house. Multiple falls in the last 6 months (pt does not remember falls) all related to preiods of "confusion". ?ADLs Comments: Pt reports being independent with ADL management at home. ?  ? ?  ?  ?OT Problem  List: Decreased strength;Decreased activity tolerance;Decreased safety awareness;Decreased knowledge of use of DME or AE ?  ?   ?OT Treatment/Interventions: Self-care/ADL training;Therapeutic exercise;Therapeutic activities;DME and/or AE instruction;Balance training;Patient/family education;Energy conservation  ?  ?OT Goals(Current goals can be found in the care plan section) Acute Rehab OT Goals ?Patient Stated Goal: To go home and get back to regular routines. ?OT Goal Formulation: With patient ?Time For Goal Achievement: 03/02/22 ?Potential to Achieve Goals: Good ?ADL Goals ?Pt Will Perform Grooming: standing;with modified independence;with set-up ?Pt Will Perform Lower Body Dressing: sit to/from stand;with modified independence ?Pt Will Transfer to Toilet: with modified independence;bedside commode;ambulating ?Pt Will Perform Toileting - Clothing Manipulation and hygiene: sit to/from stand;with modified independence;with adaptive equipment (c LRAD PRN for improved safety and functional indep.)  ?OT Frequency: Min 2X/week ?  ? ?Co-evaluation   ?  ?  ?  ?  ? ?  ?AM-PAC OT "6 Clicks" Daily Activity     ?Outcome Measure Help from another person eating meals?: None ?Help from another person taking care of personal grooming?: A Little ?Help from another person toileting, which  includes using toliet, bedpan, or urinal?: A Little ?Help from another person bathing (including washing, rinsing, drying)?: A Little ?Help from another person to put on and taking off regular upper body clothing?: A Little ?Help from another person to put on and taking off regular lower body clothing?: A Little ?6 Click Score: 19 ?  ?End of Session Equipment Utilized During Treatment: Gait belt;Rolling walker (2 wheels) ? ?Activity Tolerance: Patient tolerated treatment well ?Patient left: in chair;with call bell/phone within reach ? ?OT Visit Diagnosis: Other abnormalities of gait and mobility (R26.89);Muscle weakness (generalized) (M62.81)   ?              ?Time: [id number] ?OT Time Calculation (min): 28 min ?Charges:  OT General Charges ?$OT Visit: 1 Visit ?OT Evaluation ?$OT Eval Moderate Complexity: 1 Mod ?OT Treatments ?$Self Care/Home Management : 8-2

## 2022-02-16 NOTE — Progress Notes (Signed)
?   02/16/22 1000  ?Clinical Encounter Type  ?Visited With Patient  ?Visit Type Initial;Social support  ?Spiritual Encounters  ?Spiritual Needs Other (Comment)  ? ?Patient speaks Spanish only and Chaplain provided conversation in heart language for support. ?

## 2022-02-17 ENCOUNTER — Inpatient Hospital Stay: Payer: Self-pay

## 2022-02-17 ENCOUNTER — Encounter: Payer: Self-pay | Admitting: Internal Medicine

## 2022-02-17 DIAGNOSIS — J9601 Acute respiratory failure with hypoxia: Secondary | ICD-10-CM

## 2022-02-17 LAB — PROTIME-INR
INR: 1.8 — ABNORMAL HIGH (ref 0.8–1.2)
Prothrombin Time: 20.6 seconds — ABNORMAL HIGH (ref 11.4–15.2)

## 2022-02-17 LAB — GLUCOSE, CAPILLARY
Glucose-Capillary: 148 mg/dL — ABNORMAL HIGH (ref 70–99)
Glucose-Capillary: 196 mg/dL — ABNORMAL HIGH (ref 70–99)
Glucose-Capillary: 176 mg/dL — ABNORMAL HIGH (ref 70–99)

## 2022-02-17 LAB — COMPREHENSIVE METABOLIC PANEL
ALT: 18 U/L (ref 0–44)
AST: 35 U/L (ref 15–41)
Albumin: 2.6 g/dL — ABNORMAL LOW (ref 3.5–5.0)
Alkaline Phosphatase: 135 U/L — ABNORMAL HIGH (ref 38–126)
Anion gap: 5 (ref 5–15)
BUN: 29 mg/dL — ABNORMAL HIGH (ref 6–20)
CO2: 21 mmol/L — ABNORMAL LOW (ref 22–32)
Calcium: 7.5 mg/dL — ABNORMAL LOW (ref 8.9–10.3)
Chloride: 112 mmol/L — ABNORMAL HIGH (ref 98–111)
Creatinine, Ser: 1.16 mg/dL (ref 0.61–1.24)
GFR, Estimated: >60 mL/min (ref >60)
Glucose, Bld: 179 mg/dL — ABNORMAL HIGH (ref 70–99)
Potassium: 3.5 mmol/L (ref 3.5–5.1)
Sodium: 138 mmol/L (ref 135–145)
Total Bilirubin: 1.3 mg/dL — ABNORMAL HIGH (ref 0.3–1.2)
Total Protein: 5.3 g/dL — ABNORMAL LOW (ref 6.5–8.1)

## 2022-02-17 LAB — CBC
HCT: 22.1 % — ABNORMAL LOW (ref 39.0–52.0)
Hemoglobin: 7.5 g/dL — ABNORMAL LOW (ref 13.0–17.0)
MCH: 32.1 pg (ref 26.0–34.0)
MCHC: 33.9 g/dL (ref 30.0–36.0)
MCV: 94.4 fL (ref 80.0–100.0)
Platelets: 109 10*3/uL — ABNORMAL LOW (ref 150–400)
RBC: 2.34 MIL/uL — ABNORMAL LOW (ref 4.22–5.81)
RDW: 16.5 % — ABNORMAL HIGH (ref 11.5–15.5)
WBC: 5.7 10*3/uL (ref 4.0–10.5)
nRBC: 0 % (ref 0.0–0.2)

## 2022-02-17 LAB — MAGNESIUM: Magnesium: 2.2 mg/dL (ref 1.7–2.4)

## 2022-02-17 LAB — PHOSPHORUS: Phosphorus: 2.8 mg/dL (ref 2.5–4.6)

## 2022-02-17 MED ORDER — LACTULOSE 10 GM/15ML PO SOLN: 20.0000 g | Freq: Three times a day (TID) | ORAL | 0 refills | Status: DC

## 2022-02-17 MED ORDER — POTASSIUM CHLORIDE 20 MEQ PO PACK
40.0000 meq | PACK | Freq: Once | ORAL | Status: DC
  Filled 2022-02-17: qty 2

## 2022-02-17 NOTE — Plan of Care (Signed)
  Problem: Clinical Measurements: Goal: Ability to maintain clinical measurements within normal limits will improve Outcome: Progressing Goal: Will remain free from infection Outcome: Progressing Goal: Diagnostic test results will improve Outcome: Progressing Goal: Respiratory complications will improve Outcome: Progressing Goal: Cardiovascular complication will be avoided Outcome: Progressing   Problem: Skin Integrity: Goal: Risk for impaired skin integrity will decrease Outcome: Progressing   

## 2022-02-17 NOTE — Progress Notes (Addendum)
Occupational Therapy Treatment ?Patient Details ?Name: Shawn Torres ?MRN: [id number] ?DOB: 03/05/1966 ?Today's Date: 02/17/2022 ? ? ?History of present illness 56 y.o. hispanic male with medical history significant for alcoholic liver cirrhosis and ascites, type 2 diabetes mellitus, who presented to emergency room with altered mental status and fall at home. MD assessment includes: Hepatic encephalopathy, Alcohol liver cirrhosis with ascites s/p paracentesis.  Pt hospitalized for similar earlier this year. ?  ?OT comments ? Chart reviewed, pt greeted in bed agreeable to OT tx session. Interpreter Kelby Fam [phone] utilized throughout tx session via video interpreter device. Tx session targeted progressing functional endurance and strength  during functional mobility and ADL tasks. Pt performed toileting ADLs and standing grooming tasks at supervision level. Pt amb with RW with supervision to bathroom, HHA with MIN A from bathroom back to chair. Pt is left in chair, NAD, MD present. OT will continue to follow acutely.   ? ?Recommendations for follow up therapy are one component of a multi-disciplinary discharge planning process, led by the attending physician.  Recommendations may be updated based on patient status, additional functional criteria and insurance authorization. ?   ?Follow Up Recommendations ? No OT follow up  ?  ?Assistance Recommended at Discharge PRN  ?Patient can return home with the following ? A little help with bathing/dressing/bathroom ?  ?Equipment Recommendations ? None recommended by OT  ?  ?Recommendations for Other Services   ? ?  ?Precautions / Restrictions Precautions ?Precautions: Fall ?Restrictions ?Weight Bearing Restrictions: No  ? ? ?  ? ?Mobility Bed Mobility ?Overal bed mobility: Modified Independent ?  ?  ?  ?  ?  ?  ?  ?  ? ?Transfers ?Overall transfer level: Needs assistance ?Equipment used: Rolling walker (2 wheels) ?Transfers: Sit to/from Stand ?Sit to Stand: Modified independent  (Device/Increase time) ?  ?  ?  ?  ?  ?  ?  ?  ?Balance Overall balance assessment: Needs assistance ?Sitting-balance support: Feet supported ?Sitting balance-Leahy Scale: Normal ?  ?  ?  ?Standing balance-Leahy Scale: Fair ?  ?  ?  ?  ?  ?  ?  ?  ?  ?  ?  ?  ?   ? ?ADL either performed or assessed with clinical judgement  ? ?ADL Overall ADL's : Needs assistance/impaired ?  ?  ?  ?  ?  ?  ?  ?  ?  ?  ?  ?  ?  ?  ?  ?  ?  ?  ?  ?General ADL Comments: functional amb to bathroom with RW with supervision, toilet transfer with supervision, toileting with supervision, standing grooming tasks with supervision; amb with HHA with MIN A back to chair; ?  ? ?Extremity/Trunk Assessment   ?  ?  ?  ?  ?  ? ?Vision   ?  ?  ?Perception   ?  ?Praxis   ?  ? ?Cognition Arousal/Alertness: Awake/alert ?Behavior During Therapy: [facility] for tasks assessed/performed ?Overall Cognitive Status: Within Functional Limits for tasks assessed ?  ?  ?  ?  ?  ?  ?  ?  ?  ?  ?  ?  ?  ?  ?  ?  ?  ?  ?  ?   ?Exercises   ? ?  ?Shoulder Instructions   ? ? ?  ?General Comments vss throughout  ? ? ?Pertinent Vitals/ Pain       Pain Assessment ?Pain Assessment: Faces ?Faces  Pain Scale: Hurts a little bit ?Pain Location: R elbow ?Pain Descriptors / Indicators: Aching ?Pain Intervention(s): Limited activity within patient's tolerance, Monitored during session, Repositioned ? ?Home Living   ?  ?  ?  ?  ?  ?  ?  ?  ?  ?  ?  ?  ?  ?  ?  ?  ?  ?  ? ?  ?Prior Functioning/Environment    ?  ?  ?  ?   ? ?Frequency ? Min 2X/week  ? ? ? ? ?  ?Progress Toward Goals ? ?OT Goals(current goals can now be found in the care plan section) ? Progress towards OT goals: Progressing toward goals ? ?Acute Rehab OT Goals ?Patient Stated Goal: go home ?OT Goal Formulation: With patient ?Time For Goal Achievement: 03/03/22 ?Potential to Achieve Goals: Good  ?Plan Discharge plan remains appropriate   ? ?Co-evaluation ? ? ?   ?  ?  ?  ?  ? ?  ?AM-PAC OT "6 Clicks" Daily Activity      ?Outcome Measure ? ? Help from another person eating meals?: None ?Help from another person taking care of personal grooming?: A Little ?Help from another person toileting, which includes using toliet, bedpan, or urinal?: A Little ?Help from another person bathing (including washing, rinsing, drying)?: A Little ?Help from another person to put on and taking off regular upper body clothing?: A Little ?Help from another person to put on and taking off regular lower body clothing?: A Little ?6 Click Score: 19 ? ?  ?End of Session Equipment Utilized During Treatment: Gait belt;Rolling walker (2 wheels) ? ?OT Visit Diagnosis: Other abnormalities of gait and mobility (R26.89);Muscle weakness (generalized) (M62.81) ?  ?Activity Tolerance Patient tolerated treatment well ?  ?Patient Left in chair;with call bell/phone within reach ?  ?Nurse Communication Mobility status ?  ? ?   ? ?Time: 0932-6712 ?OT Time Calculation (min): 22 min ? ?Charges: OT General Charges ?$OT Visit: 1 Visit ?OT Treatments ?$Self Care/Home Management : 8-22 mins ? ?Oleta Mouse, OTD OTR/L  ?02/17/22, 11:56 AM  ?

## 2022-02-17 NOTE — Discharge Summary (Signed)
?Physician Discharge Summary ?  ?Patient: Morell Brace MRN: [id number] DOB: 20-Apr-1966  ?Admit date:     02/13/2022  ?Discharge date: 02/17/22  ?Discharge Physician: Jennye Boroughs  ? ?PCP: Caryl Never, MD  ? ?Recommendations at discharge:  ? ? ? ?Discharge Diagnoses: ?Principal Problem: ?  Acute hepatic encephalopathy ?Active Problems: ?  Acute respiratory failure with hypoxia ([place]) ?  Lactic acidosis ? ?Resolved Problems: ?  * No resolved hospital problems. * ? ?Hospital Course: ? ?Mr. Garlin Haidet is a 56 year old male with medical history significant for history of chronic anemia, chronic thrombocytopenia, alcohol use disorder (quit drinking alcohol 11 months prior to admission) CKD stage IIIa, alcoholic liver cirrhosis, portal hypertension, ascites, hepatic encephalopathy (recently discharged on 01/03/2022 after hospitalization for hepatic encephalopathy).  He was brought to the hospital because of confusion and a fall.  Reportedly, he fell on the bathroom floor and his girlfriend found him on the floor in a confused state. ? ?He was found to have acute hepatic encephalopathy.  He was intubated and placed on mechanical ventilation for airway protection and acute hypoxic respiratory failure.  He was treated with lactulose, rifaximin, IV fluids and empiric IV antibiotics.  He underwent paracentesis for ascites.  He had hypokalemia that was treated.   ? ?His condition improved and he was transferred out of the ICU to the [facility] hospitalist service on 02/17/2022.  He was tolerating room air and he did not have any symptoms.  He was deemed stable for discharge to home today.  Discharge plan was discussed with the patient using the iPad interpretation service.  The importance of medical adherence was reiterated.  Discharge plan was also discussed with his daughter, [name], over the phone. ? ? ?  ? ? ?Consultants: Neurosurgeon, intensivist ?Procedures performed: Paracentesis ?Disposition: Home ?Diet  recommendation:  ?Discharge Diet Orders (From admission, onward)  ? ?  Start     Ordered  ? 02/17/22 0000  Diet - low sodium heart healthy       ? 02/17/22 1647  ? ?  ?  ? ?  ? ?Cardiac diet ?DISCHARGE MEDICATION: ?Allergies as of 02/17/2022   ? ?   Reactions  ? Albumin Gc Rash  ? Patient gets rash with albumin. See media tab picture dated 12/24/21. Needs pre-meds w/ albumin (benadryl) to avoid allergic reaction.   ? ?  ? ?  ?Medication List  ?  ? ?STOP taking these medications   ? ?thiamine 100 MG tablet ?  ? ?  ? ?TAKE these medications   ? ?feeding supplement Liqd ?Take 237 mLs by mouth 3 (three) times daily between meals. ?  ?folic acid 1 MG tablet ?Commonly known as: FOLVITE ?Take 1 tablet by mouth daily. ?  ?furosemide 40 MG tablet ?Commonly known as: LASIX ?Take 1 tablet (40 mg total) by mouth daily. ?  ?lactulose 10 GM/15ML solution ?Commonly known as: [place] ?Take 30 mLs (20 g total) by mouth 3 (three) times daily. ?What changed:  ?when to take this ?reasons to take this ?  ?nadolol 20 MG tablet ?Commonly known as: CORGARD ?Take 20 mg by mouth daily. ?  ?pantoprazole 40 MG tablet ?Commonly known as: PROTONIX ?Take 1 tablet (40 mg total) by mouth 2 (two) times daily. ?  ?spironolactone 100 MG tablet ?Commonly known as: ALDACTONE ?Take 1 tablet (100 mg total) by mouth daily. ?  ?tamsulosin 0.4 MG Caps capsule ?Commonly known as: FLOMAX ?Take 1 capsule (0.4 mg total) by mouth daily. ?  ? ?  ? ? ?  Discharge Exam: ?Filed Weights  ? 02/13/22 0800 02/15/22 0500 02/17/22 0600  ?Weight: 82.7 kg 82 kg 80.1 kg  ? ?GEN: NAD ?SKIN: No rash ?EYES: EOMI ?ENT: MMM ?CV: RRR ?PULM: CTA B ?ABD: soft, distended, NT, +BS ?CNS: AAO x 3, non focal ?EXT: No edema or tenderness ? ? ?Condition at discharge: stable ? ?The results of significant diagnostics from this hospitalization (including imaging, microbiology, ancillary and laboratory) are listed below for reference.  ? ?Imaging Studies: ?DG Forearm Right ? ?Result Date:  02/16/2022 ?CLINICAL DATA:  Acute RIGHT forearm pain in a 56 year old. EXAM: RIGHT FOREARM - 2 VIEW COMPARISON:  None FINDINGS: IV in place.  One about the wrist the other about the mid forearm. Signs of enthesopathy upon the medial epicondyle and the olecranon. No substantial soft tissue swelling, sign of fracture or dislocation. IMPRESSION: No acute findings. Electronically Signed   By: Zetta Bills M.D.   On: 02/16/2022 11:30  ? ?DG Abd 1 View ? ?Result Date: 02/13/2022 ?CLINICAL DATA:  Found unconscious.  Nasogastric tube placement EXAM: ABDOMEN - 1 VIEW COMPARISON:  Abdominal CT 12/18/2021 FINDINGS: Artifact from EKG leads. Enteric tube with tip at the stomach and side port near the GE junction. The covered bowel gas pattern is normal. IMPRESSION: Enteric tube with tip at the stomach and side port near the GE junction. Electronically Signed   By: Jorje Guild M.D.   On: 02/13/2022 07:55  ? ?CT HEAD WO CONTRAST (5MM) ? ?Result Date: 02/13/2022 ?CLINICAL DATA:  Blunt polytrauma. EXAM: CT HEAD WITHOUT CONTRAST CT CERVICAL SPINE WITHOUT CONTRAST TECHNIQUE: Multidetector CT imaging of the head and cervical spine was performed following the standard protocol without intravenous contrast. Multiplanar CT image reconstructions of the cervical spine were also generated. RADIATION DOSE REDUCTION: This exam was performed according to the departmental dose-optimization program which includes automated exposure control, adjustment of the mA and/or kV according to patient size and/or use of iterative reconstruction technique. COMPARISON:  No prior cervical spine CT. Comparison is made with the head CT of 12/30/2021. FINDINGS: CT HEAD FINDINGS Brain: No evidence of acute infarction, hemorrhage, hydrocephalus, extra-axial collection or mass lesion/mass effect. Vascular: There are vascular calcifications in the siphons and scalp but no hyperdense central vessels. Skull: Normal. Negative for fracture or focal lesion.  Sinuses/Orbits: There is mild membrane thickening in the ethmoid and maxillary sinuses without fluid levels. Other visible sinuses, mastoid air cells are clear. There is a chronic depressed fracture of the medial wall right orbit. Other: None. CT CERVICAL SPINE FINDINGS Alignment: Normal. Skull base and vertebrae: There is normal bone mineralization with no evidence of fractures or focal bone lesion. Soft tissues and spinal canal: No prevertebral fluid or swelling. No visible canal hematoma. There is partially visible orotracheal and oroenteric intubation. Disc levels: There is preservation of the normal vertebral and disc heights. There is early spondylosis, including with mild posterior disc osteophyte complex formation at C3-4, C4-5 and C5-6. At C6-7 there is a broad-based posterior disc protrusion mildly deforming the ventral cord surface. There are trace facet joint spurs at most levels but no significant foraminal compromise at any level. Minimal uncinate spurring is seen at C3-4 and C4-5 but is nonstenosing. Upper chest: Negative. Other: There is retained fluid in the nasopharynx and oropharynx. IMPRESSION: 1. No acute intracranial CT findings or depressed skull fractures. 2. No evidence of cervical fractures or malalignment. 3. Early degenerative features of the cervical spine, and a broad-based herniated disc at C6-7 deforming the ventral  cord surface. 4. Intubated with retained fluid in the nasopharynx and oropharynx Electronically Signed   By: Telford Nab M.D.   On: 02/13/2022 05:10  ? ?CT Cervical Spine Wo Contrast ? ?Result Date: 02/13/2022 ?CLINICAL DATA:  Blunt polytrauma. EXAM: CT HEAD WITHOUT CONTRAST CT CERVICAL SPINE WITHOUT CONTRAST TECHNIQUE: Multidetector CT imaging of the head and cervical spine was performed following the standard protocol without intravenous contrast. Multiplanar CT image reconstructions of the cervical spine were also generated. RADIATION DOSE REDUCTION: This exam was  performed according to the departmental dose-optimization program which includes automated exposure control, adjustment of the mA and/or kV according to patient size and/or use of iterative reconstruction technique. COMPARISON:  No p

## 2022-03-16 LAB — FUNGUS CULTURE WITH STAIN

## 2022-03-16 LAB — FUNGUS CULTURE RESULT

## 2022-03-16 LAB — FUNGAL ORGANISM REFLEX

## 2022-03-24 ENCOUNTER — Inpatient Hospital Stay: Payer: Self-pay

## 2022-03-24 ENCOUNTER — Emergency Department: Payer: Self-pay

## 2022-03-24 ENCOUNTER — Inpatient Hospital Stay
Admission: EM | Admit: 2022-03-24 | Discharge: 2022-03-29 | DRG: 441 | Disposition: A | Discharge status: Home Health | Payer: Self-pay | Attending: Internal Medicine | Admitting: Internal Medicine

## 2022-03-24 DIAGNOSIS — F1021 Alcohol dependence, in remission: Secondary | ICD-10-CM | POA: Diagnosis present

## 2022-03-24 DIAGNOSIS — J9601 Acute respiratory failure with hypoxia: Secondary | ICD-10-CM | POA: Diagnosis present

## 2022-03-24 DIAGNOSIS — E440 Moderate protein-calorie malnutrition: Secondary | ICD-10-CM | POA: Diagnosis present

## 2022-03-24 DIAGNOSIS — Z888 Allergy status to other drugs, medicaments and biological substances status: Secondary | ICD-10-CM

## 2022-03-24 DIAGNOSIS — T68XXXA Hypothermia, initial encounter: Secondary | ICD-10-CM

## 2022-03-24 DIAGNOSIS — D6959 Other secondary thrombocytopenia: Secondary | ICD-10-CM | POA: Diagnosis present

## 2022-03-24 DIAGNOSIS — K7682 Hepatic encephalopathy: Principal | ICD-10-CM

## 2022-03-24 DIAGNOSIS — R680 Hypothermia, not associated with low environmental temperature: Secondary | ICD-10-CM | POA: Diagnosis present

## 2022-03-24 DIAGNOSIS — E873 Alkalosis: Secondary | ICD-10-CM

## 2022-03-24 DIAGNOSIS — E1165 Type 2 diabetes mellitus with hyperglycemia: Secondary | ICD-10-CM | POA: Diagnosis not present

## 2022-03-24 DIAGNOSIS — N179 Acute kidney failure, unspecified: Secondary | ICD-10-CM | POA: Diagnosis present

## 2022-03-24 DIAGNOSIS — D638 Anemia in other chronic diseases classified elsewhere: Secondary | ICD-10-CM | POA: Diagnosis present

## 2022-03-24 DIAGNOSIS — Z6826 Body mass index (BMI) 26.0-26.9, adult: Secondary | ICD-10-CM

## 2022-03-24 DIAGNOSIS — K7031 Alcoholic cirrhosis of liver with ascites: Secondary | ICD-10-CM | POA: Diagnosis present

## 2022-03-24 DIAGNOSIS — J9602 Acute respiratory failure with hypercapnia: Secondary | ICD-10-CM | POA: Diagnosis present

## 2022-03-24 DIAGNOSIS — K7210 Chronic hepatic failure without coma: Secondary | ICD-10-CM | POA: Diagnosis present

## 2022-03-24 DIAGNOSIS — T473X6A Underdosing of saline and osmotic laxatives, initial encounter: Secondary | ICD-10-CM | POA: Diagnosis present

## 2022-03-24 DIAGNOSIS — R140 Abdominal distension (gaseous): Secondary | ICD-10-CM

## 2022-03-24 DIAGNOSIS — R339 Retention of urine, unspecified: Secondary | ICD-10-CM | POA: Diagnosis present

## 2022-03-24 DIAGNOSIS — Z79899 Other long term (current) drug therapy: Secondary | ICD-10-CM

## 2022-03-24 DIAGNOSIS — E874 Mixed disorder of acid-base balance: Secondary | ICD-10-CM | POA: Diagnosis present

## 2022-03-24 DIAGNOSIS — Z91138 Patient's unintentional underdosing of medication regimen for other reason: Secondary | ICD-10-CM

## 2022-03-24 DIAGNOSIS — N182 Chronic kidney disease, stage 2 (mild): Secondary | ICD-10-CM | POA: Diagnosis present

## 2022-03-24 DIAGNOSIS — Z20822 Contact with and (suspected) exposure to covid-19: Secondary | ICD-10-CM | POA: Diagnosis present

## 2022-03-24 LAB — BLOOD GAS, ARTERIAL
Acid-base deficit: 2.2 mmol/L — ABNORMAL HIGH (ref 0.0–2.0)
Acid-base deficit: 6 mmol/L — ABNORMAL HIGH (ref 0.0–2.0)
Bicarbonate: 18.8 mmol/L — ABNORMAL LOW (ref 20.0–28.0)
Bicarbonate: 16.9 mmol/L — ABNORMAL LOW (ref 20.0–28.0)
FIO2: 0.4 %
MECHVT: 420 mL
O2 Saturation: 99 %
O2 Saturation: 100 %
PEEP: 5 cmH2O
Patient temperature: 37
Patient temperature: 37
RATE: 18 resp/min
pCO2 arterial: 23 mmHg — ABNORMAL LOW (ref 32–48)
pCO2 arterial: 26 mmHg — ABNORMAL LOW (ref 32–48)
pH, Arterial: 7.52 — ABNORMAL HIGH (ref 7.35–7.45)
pH, Arterial: 7.42 (ref 7.35–7.45)
pO2, Arterial: 117 mmHg — ABNORMAL HIGH (ref 83–108)
pO2, Arterial: 161 mmHg — ABNORMAL HIGH (ref 83–108)

## 2022-03-24 LAB — BODY FLUID CELL COUNT WITH DIFFERENTIAL
Eos, Fluid: 0 %
Lymphs, Fluid: 50 %
Monocyte-Macrophage-Serous Fluid: 44 %
Neutrophil Count, Fluid: 6 %
Total Nucleated Cell Count, Fluid: 99 cu mm

## 2022-03-24 LAB — COMPREHENSIVE METABOLIC PANEL
ALT: 17 U/L (ref 0–44)
AST: 30 U/L (ref 15–41)
Albumin: 2.7 g/dL — ABNORMAL LOW (ref 3.5–5.0)
Alkaline Phosphatase: 182 U/L — ABNORMAL HIGH (ref 38–126)
Anion gap: 8 (ref 5–15)
BUN: 26 mg/dL — ABNORMAL HIGH (ref 6–20)
CO2: 18 mmol/L — ABNORMAL LOW (ref 22–32)
Calcium: 8.2 mg/dL — ABNORMAL LOW (ref 8.9–10.3)
Chloride: 112 mmol/L — ABNORMAL HIGH (ref 98–111)
Creatinine, Ser: 1.53 mg/dL — ABNORMAL HIGH (ref 0.61–1.24)
GFR, Estimated: 54 mL/min — ABNORMAL LOW (ref >60)
Glucose, Bld: 274 mg/dL — ABNORMAL HIGH (ref 70–99)
Potassium: 4.1 mmol/L (ref 3.5–5.1)
Sodium: 138 mmol/L (ref 135–145)
Total Bilirubin: 2.3 mg/dL — ABNORMAL HIGH (ref 0.3–1.2)
Total Protein: 6.6 g/dL (ref 6.5–8.1)

## 2022-03-24 LAB — CBC
HCT: 27.5 % — ABNORMAL LOW (ref 39.0–52.0)
Hemoglobin: 9.7 g/dL — ABNORMAL LOW (ref 13.0–17.0)
MCH: 32 pg (ref 26.0–34.0)
MCHC: 35.3 g/dL (ref 30.0–36.0)
MCV: 90.8 fL (ref 80.0–100.0)
Platelets: 122 10*3/uL — ABNORMAL LOW (ref 150–400)
RBC: 3.03 MIL/uL — ABNORMAL LOW (ref 4.22–5.81)
RDW: 14.3 % (ref 11.5–15.5)
WBC: 5.5 10*3/uL (ref 4.0–10.5)
nRBC: 0 % (ref 0.0–0.2)

## 2022-03-24 LAB — URINALYSIS, ROUTINE W REFLEX MICROSCOPIC
Bilirubin Urine: NEGATIVE
Glucose, UA: NEGATIVE mg/dL
Ketones, ur: NEGATIVE mg/dL
Leukocytes,Ua: NEGATIVE
Nitrite: NEGATIVE
Protein, ur: NEGATIVE mg/dL
Specific Gravity, Urine: 1.008 (ref 1.005–1.030)
pH: 6 (ref 5.0–8.0)

## 2022-03-24 LAB — RESP PANEL BY RT-PCR (FLU A&B, COVID) ARPGX2
Influenza A by PCR: NEGATIVE
Influenza B by PCR: NEGATIVE
SARS Coronavirus 2 by RT PCR: NEGATIVE

## 2022-03-24 LAB — MRSA NEXT GEN BY PCR, NASAL: MRSA by PCR Next Gen: NOT DETECTED

## 2022-03-24 LAB — URINE DRUG SCREEN, QUALITATIVE (ARMC ONLY)
Amphetamines, Ur Screen: NOT DETECTED
Barbiturates, Ur Screen: NOT DETECTED
Benzodiazepine, Ur Scrn: NOT DETECTED
Cannabinoid 50 Ng, Ur ~~LOC~~: NOT DETECTED
Cocaine Metabolite,Ur ~~LOC~~: NOT DETECTED
MDMA (Ecstasy)Ur Screen: NOT DETECTED
Methadone Scn, Ur: NOT DETECTED
Opiate, Ur Screen: NOT DETECTED
Phencyclidine (PCP) Ur S: NOT DETECTED
Tricyclic, Ur Screen: NOT DETECTED

## 2022-03-24 LAB — MAGNESIUM: Magnesium: 1.8 mg/dL (ref 1.7–2.4)

## 2022-03-24 LAB — PROTIME-INR
INR: 1.8 — ABNORMAL HIGH (ref 0.8–1.2)
Prothrombin Time: 20.6 seconds — ABNORMAL HIGH (ref 11.4–15.2)

## 2022-03-24 LAB — PROCALCITONIN: Procalcitonin: <0.1 ng/mL

## 2022-03-24 LAB — GLUCOSE, CAPILLARY
Glucose-Capillary: 89 mg/dL (ref 70–99)
Glucose-Capillary: 91 mg/dL (ref 70–99)

## 2022-03-24 LAB — LACTIC ACID, PLASMA
Lactic Acid, Venous: 2.5 mmol/L (ref 0.5–1.9)
Lactic Acid, Venous: 2 mmol/L (ref 0.5–1.9)

## 2022-03-24 LAB — ETHANOL: Alcohol, Ethyl (B): <10 mg/dL (ref <10)

## 2022-03-24 LAB — TROPONIN I (HIGH SENSITIVITY)
Troponin I (High Sensitivity): 6 ng/L (ref <18)
Troponin I (High Sensitivity): 7 ng/L (ref <18)

## 2022-03-24 LAB — AMMONIA: Ammonia: 338 umol/L — ABNORMAL HIGH (ref 9–35)

## 2022-03-24 MED ORDER — ENOXAPARIN SODIUM 60 MG/0.6ML IJ SOSY
0.5000 mg/kg | PREFILLED_SYRINGE | INTRAMUSCULAR | Status: DC
  Administered 2022-03-24: 50 mg via SUBCUTANEOUS
  Filled 2022-03-24: qty 0.5

## 2022-03-24 MED ORDER — FENTANYL 2500MCG IN NS 250ML (10MCG/ML) PREMIX INFUSION
50.0000 ug/h | INTRAVENOUS | Status: DC
  Administered 2022-03-24: 75 ug/h via INTRAVENOUS
  Filled 2022-03-24: qty 250

## 2022-03-24 MED ORDER — VANCOMYCIN HCL IN DEXTROSE 1-5 GM/200ML-% IV SOLN: 1000.0000 mg | Freq: Once | INTRAVENOUS | Status: DC

## 2022-03-24 MED ORDER — PANTOPRAZOLE 2 MG/ML SUSPENSION
40.0000 mg | Freq: Every day | ORAL | Status: DC
  Administered 2022-03-24 – 2022-03-27 (×4): 40 mg
  Filled 2022-03-24 (×5): qty 20

## 2022-03-24 MED ORDER — FOLIC ACID 1 MG PO TABS
1.0000 mg | ORAL_TABLET | Freq: Every day | ORAL | Status: DC
  Administered 2022-03-24 – 2022-03-27 (×4): 1 mg
  Filled 2022-03-24 (×4): qty 1

## 2022-03-24 MED ORDER — FREE WATER
30.0000 mL | Status: DC
  Administered 2022-03-24 – 2022-03-27 (×16): 30 mL

## 2022-03-24 MED ORDER — PROPOFOL 1000 MG/100ML IV EMUL
5.0000 ug/kg/min | INTRAVENOUS | Status: DC
  Administered 2022-03-24 – 2022-03-25 (×3): 15 ug/kg/min via INTRAVENOUS
  Administered 2022-03-26: 10 ug/kg/min via INTRAVENOUS
  Administered 2022-03-26 (×2): 20 ug/kg/min via INTRAVENOUS
  Administered 2022-03-27: 10 ug/kg/min via INTRAVENOUS
  Filled 2022-03-24 (×7): qty 100

## 2022-03-24 MED ORDER — RIFAXIMIN 550 MG PO TABS
550.0000 mg | ORAL_TABLET | Freq: Two times a day (BID) | ORAL | Status: DC
  Administered 2022-03-24 – 2022-03-27 (×7): 550 mg
  Filled 2022-03-24 (×8): qty 1

## 2022-03-24 MED ORDER — ETOMIDATE 2 MG/ML IV SOLN
INTRAVENOUS | Status: AC
  Administered 2022-03-24: 20 mg
  Filled 2022-03-24: qty 10

## 2022-03-24 MED ORDER — SUCCINYLCHOLINE CHLORIDE 200 MG/10ML IV SOSY
PREFILLED_SYRINGE | INTRAVENOUS | Status: AC
  Administered 2022-03-24: 120 mg
  Filled 2022-03-24: qty 10

## 2022-03-24 MED ORDER — FENTANYL CITRATE PF 50 MCG/ML IJ SOSY: 50.0000 ug | PREFILLED_SYRINGE | INTRAMUSCULAR | Status: DC | PRN

## 2022-03-24 MED ORDER — LACTATED RINGERS IV SOLN: INTRAVENOUS | Status: DC

## 2022-03-24 MED ORDER — THIAMINE HCL 100 MG PO TABS
100.0000 mg | ORAL_TABLET | Freq: Every day | ORAL | Status: DC
  Administered 2022-03-24 – 2022-03-27 (×4): 100 mg
  Filled 2022-03-24 (×4): qty 1

## 2022-03-24 MED ORDER — PROPOFOL 1000 MG/100ML IV EMUL
INTRAVENOUS | Status: AC
  Administered 2022-03-24: 10 ug/kg/min
  Filled 2022-03-24: qty 100

## 2022-03-24 MED ORDER — FENTANYL BOLUS VIA INFUSION
50.0000 ug | INTRAVENOUS | Status: DC | PRN
  Filled 2022-03-24: qty 100

## 2022-03-24 MED ORDER — VANCOMYCIN HCL 1750 MG/350ML IV SOLN
1750.0000 mg | Freq: Once | INTRAVENOUS | Status: AC
  Administered 2022-03-24: 1750 mg via INTRAVENOUS
  Filled 2022-03-24: qty 350

## 2022-03-24 MED ORDER — SODIUM CHLORIDE 0.9 % IV SOLN
2.0000 g | Freq: Once | INTRAVENOUS | Status: AC
  Administered 2022-03-24: 2 g via INTRAVENOUS
  Filled 2022-03-24: qty 12.5

## 2022-03-24 MED ORDER — DIPHENHYDRAMINE HCL 50 MG/ML IJ SOLN
50.0000 mg | Freq: Once | INTRAMUSCULAR | Status: AC
  Administered 2022-03-24: 50 mg via INTRAVENOUS
  Filled 2022-03-24: qty 1

## 2022-03-24 MED ORDER — ORAL CARE MOUTH RINSE
15.0000 mL | OROMUCOSAL | Status: DC
  Administered 2022-03-24 – 2022-03-27 (×26): 15 mL via OROMUCOSAL

## 2022-03-24 MED ORDER — STERILE WATER FOR INJECTION IV SOLN
INTRAVENOUS | Status: DC
  Filled 2022-03-24: qty 150
  Filled 2022-03-24 (×2): qty 1000
  Filled 2022-03-24 (×3): qty 150
  Filled 2022-03-24 (×2): qty 1000
  Filled 2022-03-24: qty 150
  Filled 2022-03-24: qty 1000

## 2022-03-24 MED ORDER — CHLORHEXIDINE GLUCONATE CLOTH 2 % EX PADS
6.0000 | MEDICATED_PAD | Freq: Every day | CUTANEOUS | Status: DC
  Administered 2022-03-25 – 2022-03-28 (×5): 6 via TOPICAL

## 2022-03-24 MED ORDER — POLYETHYLENE GLYCOL 3350 17 G PO PACK: 17.0000 g | PACK | Freq: Every day | ORAL | Status: DC | PRN

## 2022-03-24 MED ORDER — DOCUSATE SODIUM 50 MG/5ML PO LIQD
100.0000 mg | Freq: Two times a day (BID) | ORAL | Status: DC
  Administered 2022-03-24 – 2022-03-27 (×7): 100 mg
  Filled 2022-03-24 (×8): qty 10

## 2022-03-24 MED ORDER — POLYETHYLENE GLYCOL 3350 17 G PO PACK
17.0000 g | PACK | Freq: Every day | ORAL | Status: DC
  Administered 2022-03-24 – 2022-03-27 (×4): 17 g
  Filled 2022-03-24 (×4): qty 1

## 2022-03-24 MED ORDER — MIDAZOLAM HCL 2 MG/2ML IJ SOLN: 2.0000 mg | INTRAMUSCULAR | Status: DC | PRN

## 2022-03-24 MED ORDER — DOCUSATE SODIUM 50 MG/5ML PO LIQD
100.0000 mg | Freq: Two times a day (BID) | ORAL | Status: DC | PRN
  Filled 2022-03-24: qty 10

## 2022-03-24 MED ORDER — ADULT MULTIVITAMIN W/MINERALS CH
1.0000 | ORAL_TABLET | Freq: Every day | ORAL | Status: DC
  Administered 2022-03-24 – 2022-03-27 (×4): 1
  Filled 2022-03-24 (×4): qty 1

## 2022-03-24 MED ORDER — FENTANYL CITRATE PF 50 MCG/ML IJ SOSY
50.0000 ug | PREFILLED_SYRINGE | INTRAMUSCULAR | Status: DC | PRN
  Administered 2022-03-24: 50 ug via INTRAVENOUS
  Filled 2022-03-24: qty 1

## 2022-03-24 MED ORDER — LACTULOSE 10 GM/15ML PO SOLN
30.0000 g | Freq: Once | ORAL | Status: AC
Start: 2022-03-24 — End: 2022-03-24
  Administered 2022-03-24: 30 g
  Filled 2022-03-24: qty 60

## 2022-03-24 MED ORDER — METRONIDAZOLE 500 MG/100ML IV SOLN
500.0000 mg | Freq: Once | INTRAVENOUS | Status: AC
  Administered 2022-03-24: 500 mg via INTRAVENOUS
  Filled 2022-03-24: qty 100

## 2022-03-24 MED ORDER — SODIUM CHLORIDE 0.9 % IV BOLUS (SEPSIS)
1000.0000 mL | Freq: Once | INTRAVENOUS | Status: AC
  Administered 2022-03-24: 1000 mL via INTRAVENOUS

## 2022-03-24 MED ORDER — CHLORHEXIDINE GLUCONATE 0.12% ORAL RINSE (MEDLINE KIT)
15.0000 mL | Freq: Two times a day (BID) | OROMUCOSAL | Status: DC
  Administered 2022-03-24 – 2022-03-27 (×6): 15 mL via OROMUCOSAL

## 2022-03-24 MED ORDER — LACTULOSE 10 GM/15ML PO SOLN
30.0000 g | Freq: Three times a day (TID) | ORAL | Status: DC
  Administered 2022-03-24 – 2022-03-27 (×10): 30 g
  Filled 2022-03-24 (×10): qty 60

## 2022-03-24 MED ORDER — PROSOURCE TF PO LIQD
45.0000 mL | Freq: Two times a day (BID) | ORAL | Status: DC
  Administered 2022-03-24 – 2022-03-27 (×6): 45 mL
  Filled 2022-03-24 (×6): qty 45

## 2022-03-24 MED ORDER — SODIUM CHLORIDE 0.9 % IV SOLN: INTRAVENOUS | Status: DC

## 2022-03-24 MED ORDER — SODIUM CHLORIDE 0.9 % IV SOLN
2.0000 g | INTRAVENOUS | Status: DC
  Administered 2022-03-24: 2 g via INTRAVENOUS
  Filled 2022-03-24 (×2): qty 20

## 2022-03-24 MED ORDER — VITAL AF 1.2 CAL PO LIQD
1000.0000 mL | ORAL | Status: DC
  Administered 2022-03-24: 1000 mL

## 2022-03-24 MED ORDER — ALBUMIN HUMAN 25 % IV SOLN
25.0000 g | Freq: Once | INTRAVENOUS | Status: AC
  Administered 2022-03-24: 25 g via INTRAVENOUS
  Filled 2022-03-24: qty 100

## 2022-03-24 NOTE — ED Triage Notes (Signed)
56 y/o male arrived to the [facility] via EMS coming from home with a CC of altered mental status. EMS states pt has been altered for an unknown amount of time. PT has a history of acute pancreatitis. EMS BGL was 309. Upon assessment pt has a distended belly. Pt has a history of hepatic encephalopathy. Pt is only able to say one word in spanish and is alert.  ?

## 2022-03-24 NOTE — Progress Notes (Signed)
Initial Nutrition Assessment ? ?DOCUMENTATION CODES:  ? ?Non-severe (moderate) malnutrition in context of chronic illness ? ?INTERVENTION:  ? ?Vital 1.2@55ml /hr- Initiate at 81ml/hr and increase by 51ml/hr q 8 hours until goal rate is reached.  ? ?Pro-Source 66ml BID via tube, provides 40kcal and 11g of protein per serving  ? ?Free water flushes 77ml q4 hours to maintain tube patency  ? ?Regimen provides 1664kcal/day, 121g/day protein and 1278ml/day of free water.  ? ?Pt at high refeed risk; recommend monitor potassium, magnesium and phosphorus labs daily until stable ? ?NUTRITION DIAGNOSIS:  ? ?Moderate Malnutrition related to chronic illness (cirrhosis, etoh abuse) as evidenced by moderate fat depletion, moderate muscle depletion, severe muscle depletion. ? ?GOAL:  ? ?Provide needs based on ASPEN/SCCM guidelines ? ?MONITOR:  ? ?Vent status, Labs, Weight trends, Skin, I & O's, TF tolerance ? ?REASON FOR ASSESSMENT:  ? ?Ventilator ?  ? ?ASSESSMENT:  ? ?56 y.o. male with h/o DM, COVID 19 (2020), urinary retention, alcoholic decompensated cirrhosis, ascites and esophageal variceal bleeding s/p ligation in 09/2021 who is now admitted with hepatic encephalopathy and AKI. Pt required intubation and ventilation for airway protection. ? ?Pt sedated and ventilated. OGT in place. Plan is to start tube feeds today. Pt is well known to this RD from previous admissions. Pt with fair appetite and oral intake at baseline and does drink Ensure supplements. Per chart, pt up ~43lbs from his UBW. Pt noted to have severe ascites and plan is for paracentesis today.  ? ?Medications reviewed and include: colace, lovenox, lactulose, miralax, xifaxin, ceftriaxone, propofol, Na bicarbonate  ? ?Labs reviewed: K 4.1 wnl, BUN 26(H), creat 1.53(H), Mg 1.8 wnl ?Ammonia 338(H) ?Hgb 9.7(L), Hct 27.5(L) ? ?Patient is currently intubated on ventilator support ?MV: 7.8 L/min ?Temp (24hrs), Avg:96.2 ?F (35.7 ?C), Min:95.9 ?F (35.5 ?C), Max:96.6 ?F  (35.9 ?C) ? ?Propofol: 6.0 ml/hr- provides 158kcal/day  ? ?MAP- >42mmHg  ? ?UOP- 1058ml  ? ?NUTRITION - FOCUSED PHYSICAL EXAM: ? ?Flowsheet Row Most Recent Value  ?Orbital Region No depletion  ?Upper Arm Region Severe depletion  ?Thoracic and Lumbar Region Moderate depletion  ?Buccal Region No depletion  ?Temple Region No depletion  ?Clavicle Bone Region Moderate depletion  ?Clavicle and Acromion Bone Region Moderate depletion  ?Scapular Bone Region No depletion  ?Dorsal Hand Mild depletion  ?Patellar Region Severe depletion  ?Anterior Thigh Region Severe depletion  ?Posterior Calf Region Severe depletion  ?Edema (RD Assessment) None  ?Hair Reviewed  ?Eyes Reviewed  ?Mouth Reviewed  ?Skin Reviewed  ?Nails Reviewed  ? ?Diet Order:   ?Diet Order   ? ?       ?  Diet NPO time specified  Diet effective now       ?  ? ?  ?  ? ?  ? ?EDUCATION NEEDS:  ? ?No education needs have been identified at this time ? ?Skin:  Skin Assessment: Reviewed RN Assessment ? ?Last BM:  5/4 ? ?Height:  ? ?Ht Readings from Last 1 Encounters:  ?03/24/22 5\' 7"  (1.702 m)  ? ? ?Weight:  ? ?Wt Readings from Last 1 Encounters:  ?03/24/22 99.8 kg  ? ? ?Ideal Body Weight:  67.2 kg ? ?BMI:  Body mass index is 34.46 kg/m?. ? ?Estimated Nutritional Needs:  ? ?Kcal:  1568kcal/day ? ?Protein:  115-130g/day ? ?Fluid:  1.7-2.0L/day ? ?Koleen Distance MS, RD, LDN ?Please refer to AMION for RD and/or RD on-call/weekend/after hours pager ? ?

## 2022-03-24 NOTE — Progress Notes (Signed)
PHARMACY -  BRIEF ANTIBIOTIC NOTE  ? ?Pharmacy has received consult(s) for Cefepime and Vancomycin from an ED provider.  The patient's profile has been reviewed for ht/wt/allergies/indication/available labs.   ? ?One time order(s) placed for Cefepime 2 gm and Vancomycin 1750 mg per pt wt of 80.1 kg on 02/17/22 per previous D/C summary. ? ?Further antibiotics/pharmacy consults should be ordered by admitting physician if indicated.       ?                ?Thank you, ?Otelia Sergeant, PharmD, MBA ?03/24/2022 ?5:11 AM ? ?

## 2022-03-24 NOTE — Progress Notes (Signed)
PHARMACY CONSULT NOTE ? ?Pharmacy Consult for Electrolyte Monitoring and Replacement  ? ?Recent Labs: ?Potassium (mmol/L)  ?Date Value  ?03/24/2022 4.1  ? ?Magnesium (mg/dL)  ?Date Value  ?03/24/2022 1.8  ? ?Calcium (mg/dL)  ?Date Value  ?03/24/2022 8.2 (L)  ? ?Albumin (g/dL)  ?Date Value  ?03/24/2022 2.7 (L)  ? ?Phosphorus (mg/dL)  ?Date Value  ?02/17/2022 2.8  ? ?Sodium (mmol/L)  ?Date Value  ?03/24/2022 138  ? ? ?Assessment: ?56 year old male admitted with hepatic encephalopathy. Reportedly nonadherent to PTA lactulose. PMH includes cirrhosis, diabetes mellitus (A1c 5.1%).  ? ?On sodium bicarbonate @75  mL/hr. Receiving lactulose 30 grams TID and rifaximin.  ? ?Goal of Therapy:  ?Electrolytes within normal limits ? ?Plan:  ?No replacement warranted  ?Follow up labs in AM ? ?Wynelle Cleveland, PharmD ?Pharmacy Resident  ?03/24/2022 ?2:04 PM ? ?

## 2022-03-24 NOTE — Procedures (Signed)
PROCEDURE SUMMARY: ? ?Successful image-guided paracentesis from the right lower abdomen.  ?Yielded 4.6 liters of clear yellow fluid.  ?No immediate complications.  ?EBL < 1 mL ?Patient tolerated well.  ? ?Specimen was not sent for labs. ? ?Please see imaging section of Epic for full dictation. ? ?Villa Herb PA-C ?03/24/2022 ?4:04 PM ? ? ? ?

## 2022-03-24 NOTE — ED Notes (Signed)
Intubation completed at this time

## 2022-03-24 NOTE — H&P (Signed)
? ?NAME:  Shawn Torres, MRN:  [id number], DOB:  [birth year], LOS: 0 ?ADMISSION DATE:  03/24/2022, CONSULTATION DATE:  03/24/2022 ?REFERRING MD:  Dr Leonides Schanz, CHIEF COMPLAINT:  AMS  ? ?Brief Pt Description / Synopsis:  ?56 year old male admitted with acute hepatic encephalopathy requiring intubation for airway protection. ? ?History of Present Illness:  ?Shawn Torres is a 56 year old male with a past medical history significant for alcohol induced cirrhosis, recurrent hepatic encephalopathy, diabetes mellitus who presented to [facility] ED on 03/24/2022 due to altered mental status.  Patient remains altered, intubated and sedated with no family available, therefore history is obtained from ED and nursing notes. ? ?Per chart review his daughter reports the patient ran out of his lactulose yesterday, and was unable to get it refilled as he was not able to get in touch with his physician in time.  Normally he takes it every 3-4 hours.  His last known well time was 9:30 PM last night before going to bed when he was acting normally.  She checked on him in the middle the night and found him altered thus contacted EMS.  Daughter reports he is not drinking alcohol in over a year.  She denies any known history of fever, cough, abdominal pain, diarrhea. ? ?ED Course: ?Initial Vital Signs: Temperature 96.6 ?F, pulse 75, respiratory rate 20, blood pressure 151/94, SPO2 100% ?Significant Labs: Bicarbonate 18, glucose 274, BUN 26, creatinine 1.53, alkaline phosphatase 182, AST 30, ALT 17, ammonia 338, total bilirubin 2.3, high-sensitivity troponin 6, lactic acid 2.5, procalcitonin less than 0.1, WBC 5.5, hemoglobin 9.7, hematocrit 27.5, platelets 122, INR 1.8, PT 20.6, ethyl alcohol less than 10 ?ABG: pH 7.52/PCO2 23/PO2 117/bicarb 18.8 ?Urinalysis is negative for UTI ?Urine drug screen is negative ?Imaging ?Chest X-ray>>IMPRESSION: ?Stable low volume chest with cardiomegaly.  No acute finding. ?CT head>>IMPRESSION: ?No acute intracranial CT  findings or interval changes. There is mild ?atrophy and small-vessel disease, but more than typically seen at ?this age. No new abnormality. ?Medications Administered: 2 L normal saline boluses, IV cefepime, Flagyl, vancomycin, rectal lactulose ? ?On reevaluation by ED provider patient was noted to be clenching his jaw with blood in his mouth from biting his tongue.  No seizure-like activity noted.  Given concern for ability to protect his airway decision was made to intubate. ? ?PCCM asked to admit for further work-up and treatment of acute hepatic encephalopathy. ? ?Pertinent  Medical History  ? ?Past Medical History:  ?Diagnosis Date  ? Ascites   ? Cirrhosis, alcoholic ([place])   ? Diabetes mellitus without complication ([place])   ? Memory loss   ? ? ?Micro Data:  ?5/4: Blood culture x2>> ?5/4: Paracentesis fluid>> ? ?Antimicrobials:  ?Cefepime 5/4 x 1 dose ?Flagyl 5/4 x 1 dose ?Vancomycin 5/4 x 1 dose ?Ceftriaxone 5/4>> ?Rifaximin 5/4>> ? ?Significant Hospital Events: ?Including procedures, antibiotic start and stop dates in addition to other pertinent events   ?5/4: Required intubation in the ED for airway protection due to hepatic encephalopathy.  PCCM asked to admit.  IR consulted for paracentesis ? ?Interim History / Subjective:  ?-Patient intubated on sedation withdraws from pain ?-Overbreathing the vent, suspect compensatory mechanism for metabolic acidosis noted on ABG ?-We will start bicarb drip ?-Remains hypothermic, hemodynamically stable, no vasopressors ?-We will cover empirically for possible SBP ?-Gets weekly paracentesis performed at [facility], due for today's ~will consult IR for paracentesis ? ? ?Objective   ?Blood pressure 131/80, pulse 73, temperature (!) 96 ?F (35.6 ?C), temperature source Bladder, resp.  rate 20, weight 99.8 kg, SpO2 100 %. ?   ?Vent Mode: AC ?FiO2 (%):  [50 %] 50 % ?Set Rate:  [18 bmp] 18 bmp ?Vt Set:  [420 mL] 420 mL ?PEEP:  [5 cmH20] 5 cmH20  ? ?Intake/Output Summary (Last 24 hours)  at 03/24/2022 0746 ?Last data filed at 03/24/2022 8502 ?Gross per 24 hour  ?Intake 1000 ml  ?Output 1000 ml  ?Net 0 ml  ? ?Filed Weights  ? 03/24/22 0627 03/24/22 7741  ?Weight: 99.8 kg 99.8 kg  ? ? ?Examination: ?General: Acute on chronically ill-appearing male, laying in bed, intubated sedated, no acute distress ?HENT: Atraumatic, normocephalic, neck supple, no JVD, orally intubated ?Lungs: Clear breath sounds bilaterally, even, overbreathing the vent ?Cardiovascular: Regular rate and rhythm, S1-S2, no murmurs, rubs, gallops ?Abdomen: Distended and slightly taut, no guarding or rebound tenderness, bowel sounds positive x4 ?Extremities: Normal bulk and tone, no deformities, no edema ?Neuro: Sedated, withdraws from to commands, pupils PERRLA ?GU: Foley catheter in place ? ?Resolved Hospital Problem list   ? ? ?Assessment & Plan:  ? ?Intubated for airway protection due to hepatic encephalopathy ?-Full vent support, implement lung protective strategies ?-Plateau pressures less than 30 cm H20 ?-Wean FiO2 & PEEP as tolerated to maintain O2 sats >92% ?-Follow intermittent Chest X-ray & ABG as needed ?-Spontaneous Breathing Trials when respiratory parameters met and mental status permits ?-Implement VAP Bundle ?-Prn Bronchodilators ? ?Alcohol induced liver cirrhosis with ascites ?Elevated alkaline phosphatase ?-Trend LFTs and coags ?-Cover empirically for SBP with ceftriaxone and rifaximin ?-Consult IR for paracentesis ?-Obtain RUQ ultrasound ? ?Acute hepatic encephalopathy  ?Sedation needs in the setting of mechanical ventilation ?-Maintain a RASS goal of 0 to -1 ?-Propofol as needed to maintain RASS goal ?-Avoid sedating medications as able ?-Daily wake up assessment ?-CT head negative for acute intracranial abnormality ?-Continue lactulose 30 mg 3 times daily ?-Intermittently follow ammonia level ? ?Acute kidney injury ?Anion gap metabolic acidosis in setting of lactic acidosis and AKI ?-Monitor I&O's / urinary  output ?-Follow BMP ?-Ensure adequate renal perfusion ?-Avoid nephrotoxic agents as able ?-Replace electrolytes as indicated ?-IV fluids ?-Bicarb drip ? ?Anemia of chronic disease ?Chronic thrombocytopenia ?-Monitor for S/Sx of bleeding ?-Trend CBC ?-Heparin subcu for VTE Prophylaxis  ?-Transfuse for Hgb <7 ? ? ? ? ?Best Practice (right click and "Reselect all SmartList Selections" daily)  ? ?Diet/type: NPO ?DVT prophylaxis: LMWH ?GI prophylaxis: PPI ?Lines: N/A ?Foley:  Yes, and it is still needed ?Code Status:  full code ?Last date of multidisciplinary goals of care discussion [5/4] ? ?Labs   ?CBC: ?Recent Labs  ?Lab 03/24/22 ?2878  ?WBC 5.5  ?HGB 9.7*  ?HCT 27.5*  ?MCV 90.8  ?PLT 122*  ? ? ?Basic Metabolic Panel: ?Recent Labs  ?Lab 03/24/22 ?6767  ?NA 138  ?K 4.1  ?CL 112*  ?CO2 18*  ?GLUCOSE 274*  ?BUN 26*  ?CREATININE 1.53*  ?CALCIUM 8.2*  ? ?GFR: ?Estimated Creatinine Clearance: 62.9 mL/min (A) (by C-G formula based on SCr of 1.53 mg/dL (H)). ?Recent Labs  ?Lab 03/24/22 ?2094  ?WBC 5.5  ? ? ?Liver Function Tests: ?Recent Labs  ?Lab 03/24/22 ?7096  ?AST 30  ?ALT 17  ?ALKPHOS 182*  ?BILITOT 2.3*  ?PROT 6.6  ?ALBUMIN 2.7*  ? ?No results for input(s): LIPASE, AMYLASE in the last 168 hours. ?Recent Labs  ?Lab 03/24/22 ?2836  ?AMMONIA 338*  ? ? ?ABG ?   ?Component Value Date/Time  ? PHART 7.52 (H) 03/24/2022 6294  ?  PCO2ART 23 (L) 03/24/2022 0508  ? PO2ART 117 (H) 03/24/2022 5361  ? HCO3 18.8 (L) 03/24/2022 0508  ? ACIDBASEDEF 2.2 (H) 03/24/2022 4431  ? O2SAT 99 03/24/2022 0508  ?  ? ?Coagulation Profile: ?Recent Labs  ?Lab 03/24/22 ?5400  ?INR 1.8*  ? ? ?Cardiac Enzymes: ?No results for input(s): CKTOTAL, CKMB, CKMBINDEX, TROPONINI in the last 168 hours. ? ?HbA1C: ?Hgb A1c MFr Bld  ?Date/Time Value Ref Range Status  ?12/31/2021 06:20 AM 5.1 4.8 - 5.6 % Final  ?  Comment:  ?  (NOTE) ?Pre diabetes:          5.7%-6.4% ? ?Diabetes:              >6.4% ? ?Glycemic control for   <7.0% ?adults with diabetes ?  ?10/10/2021  04:22 PM 4.4 (L) 4.8 - 5.6 % Final  ?  Comment:  ?  (NOTE) ?Pre diabetes:          5.7%-6.4% ? ?Diabetes:              >6.4% ? ?Glycemic control for   <7.0% ?adults with diabetes ?  ? ? ?CBG: ?No results for in

## 2022-03-24 NOTE — Plan of Care (Signed)
  Problem: Clinical Measurements: Goal: Ability to maintain clinical measurements within normal limits will improve Outcome: Progressing   Problem: Clinical Measurements: Goal: Will remain free from infection Outcome: Progressing   

## 2022-03-24 NOTE — ED Provider Notes (Signed)
? ?[facility] ?Provider Note ? ? ? Event Date/Time  ? First MD Initiated Contact with Patient 03/24/22 [phone]   ?  (approximate) ? ? ?History  ? ?Altered Mental Status ? ? ?HPI ? ?Shawn Torres is a 56 y.o. male history of alcohol induced cirrhosis, diabetes, recurrent hepatic encephalopathy who presents to the emergency department EMS.  History provided by EMS and patient's daughter.  Patient's daughter reports that she he just recently moved back in with her after moving out of his ex-girlfriend's place.  She states that he ran out of lactulose yesterday.  He takes it every 3-4 hours.  She states that she was not able to get this medication refilled because it needed doctor approval which did not happen until yesterday afternoon.  She states she last saw him normal at 9:30 PM before they went to bed and he was acting normally.  She states she checked on him in the middle the night because she knew that he would become encephalopathic without his lactulose.  She states that she found him altered and called EMS.  Blood glucose with EMS was 308.  Daughter states that he has not drink any alcohol for over a year.  She denies any fevers, cough, diarrhea.  States he vomited once 2 days ago.  States he has a paracentesis every Thursday which is done at [facility].  He is due for paracentesis today. ? ? ?History provided by daughter and EMS.  Level 5 caveat due to altered mental status. ? ? ? ?Past Medical History:  ?Diagnosis Date  ? Ascites   ? Cirrhosis, alcoholic ([facility])   ? Diabetes mellitus without complication ([facility])   ? Memory loss   ? ? ?Past Surgical History:  ?Procedure Laterality Date  ? ESOPHAGOGASTRODUODENOSCOPY N/A 10/10/2021  ? Procedure: ESOPHAGOGASTRODUODENOSCOPY (EGD);  Surgeon: Midge Minium, MD;  Location: [facility] ENDOSCOPY;  Service: Endoscopy;  Laterality: N/A;  ? ESOPHAGOGASTRODUODENOSCOPY N/A 12/27/2021  ? Procedure: ESOPHAGOGASTRODUODENOSCOPY (EGD);  Surgeon: [name], DO;   Location: [facility] ENDOSCOPY;  Service: Gastroenterology;  Laterality: N/A;  Spanish Interpreter  ? ESOPHAGOGASTRODUODENOSCOPY N/A 01/20/2022  ? Procedure: ESOPHAGOGASTRODUODENOSCOPY (EGD);  Surgeon: [name], DO;  Location: [facility] ENDOSCOPY;  Service: Gastroenterology;  Laterality: N/A;  Spanish Interpreter  ? ESOPHAGOGASTRODUODENOSCOPY N/A 02/03/2022  ? Procedure: ESOPHAGOGASTRODUODENOSCOPY (EGD);  Surgeon: [name], DO;  Location: [facility] ENDOSCOPY;  Service: Gastroenterology;  Laterality: N/A;  Needs Spanish Interpreter - (prefers) all calls to daughter (she speaks [place])  ? ESOPHAGOGASTRODUODENOSCOPY (EGD) WITH PROPOFOL N/A 12/11/2021  ? Procedure: ESOPHAGOGASTRODUODENOSCOPY (EGD) WITH PROPOFOL;  Surgeon: [name], DO;  Location: [facility] ENDOSCOPY;  Service: Gastroenterology;  Laterality: N/A;  ? IR PARACENTESIS  02/15/2022  ? ? ?MEDICATIONS:  ?Prior to Admission medications   ?Medication Sig Start Date End Date Taking? Authorizing Provider  ?feeding supplement (ENSURE ENLIVE / ENSURE PLUS) LIQD Take 237 mLs by mouth 3 (three) times daily between meals. 11/03/21   Pennie Banter, DO  ?folic acid (FOLVITE) 1 MG tablet Take 1 tablet by mouth daily. 09/03/21 09/03/22  [provider]  ?furosemide (LASIX) 40 MG tablet Take 1 tablet (40 mg total) by mouth daily. 11/04/21   Pennie Banter, DO  ?lactulose (CHRONULAC) 10 GM/15ML solution Take 30 mLs (20 g total) by mouth 3 (three) times daily. 02/17/22   Lurene Shadow, MD  ?nadolol (CORGARD) 20 MG tablet Take 20 mg by mouth daily. 02/03/22   [provider]  ?pantoprazole (PROTONIX) 40 MG tablet Take  1 tablet (40 mg total) by mouth 2 (two) times daily. 12/14/21   Lurene ShadowAyiku, Bernard, MD  ?spironolactone (ALDACTONE) 100 MG tablet Take 1 tablet (100 mg total) by mouth daily. ?Patient not taking: Reported on 02/13/2022 11/04/21   Esaw GrandchildGriffith, Kelly A, DO  ?tamsulosin (FLOMAX) 0.4 MG CAPS capsule Take 1 capsule (0.4 mg total) by mouth  daily. 10/17/21   Leeroy BockAnderson, Chelsey L, MD  ? ? ?Physical Exam  ? ?Triage Vital Signs: ?ED Triage Vitals  ?Enc Vitals Group  ?   BP 03/24/22 0445 (!) 151/94  ?   Pulse Rate 03/24/22 0445 75  ?   Resp 03/24/22 0445 20  ?   Temp 03/24/22 0448 (!) 96.6 ?F (35.9 ?C)  ?   Temp Source 03/24/22 0448 Axillary  ?   SpO2 03/24/22 0445 100 %  ?   Weight --   ?   Height --   ?   Head Circumference --   ?   Peak Flow --   ?   Pain Score --   ?   Pain Loc --   ?   Pain Edu? --   ?   Excl. in GC? --   ? ? ?Most recent vital signs: ?Vitals:  ? 03/24/22 0631 03/24/22 0635  ?BP: 125/82 131/80  ?Pulse: 80 73  ?Resp: 16 20  ?Temp:  (!) 96 ?F (35.6 ?C)  ?SpO2: 100% 100%  ? ? ?CONSTITUTIONAL: Patient is awake and has his eyes open and will mumble intermittently.  He moves all 4 extremities.  He is agitated and combative at times. ?HEAD: Normocephalic, atraumatic ?EYES: Conjunctivae clear, pupils appear equal, sclera nonicteric ?ENT: normal nose; moist mucous membranes ?NECK: Supple, normal ROM ?CARD: RRR; S1 and S2 appreciated; no murmurs, no clicks, no rubs, no gallops ?RESP: Normal chest excursion without splinting or tachypnea; breath sounds clear and equal bilaterally; no wheezes, no rhonchi, no rales, no hypoxia or respiratory distress, speaking full sentences ?ABD/GI: Normal bowel sounds; abdomen is distended with fluid wave.  Soft.  No guarding. ?BACK: The back appears normal ?EXT:  no deformity noted, no edema; no cyanosis ?SKIN: Normal color for age and race; warm; no rash on exposed skin ?NEURO: Moves all extremities equally, extraocular movements appear intact, no asterixis ?PSYCH: The patient's mood and manner are appropriate. ? ? ?ED Results / Procedures / Treatments  ? ?LABS: ?(all labs ordered are listed, but only abnormal results are displayed) ?Labs Reviewed  ?COMPREHENSIVE METABOLIC PANEL - Abnormal; Notable for the following components:  ?    Result Value  ? Chloride 112 (*)   ? CO2 18 (*)   ? Glucose, Bld 274 (*)   ?  BUN 26 (*)   ? Creatinine, Ser 1.53 (*)   ? Calcium 8.2 (*)   ? Albumin 2.7 (*)   ? Alkaline Phosphatase 182 (*)   ? Total Bilirubin 2.3 (*)   ? GFR, Estimated 54 (*)   ? All other components within normal limits  ?CBC - Abnormal; Notable for the following components:  ? RBC 3.03 (*)   ? Hemoglobin 9.7 (*)   ? HCT 27.5 (*)   ? Platelets 122 (*)   ? All other components within normal limits  ?PROTIME-INR - Abnormal; Notable for the following components:  ? Prothrombin Time 20.6 (*)   ? INR 1.8 (*)   ? All other components within normal limits  ?AMMONIA - Abnormal; Notable for the following components:  ? Ammonia 338 (*)   ?  All other components within normal limits  ?URINALYSIS, ROUTINE W REFLEX MICROSCOPIC - Abnormal; Notable for the following components:  ? Color, Urine YELLOW (*)   ? APPearance CLEAR (*)   ? Hgb urine dipstick MODERATE (*)   ? Bacteria, UA RARE (*)   ? All other components within normal limits  ?BLOOD GAS, ARTERIAL - Abnormal; Notable for the following components:  ? pH, Arterial 7.52 (*)   ? pCO2 arterial 23 (*)   ? pO2, Arterial 117 (*)   ? Bicarbonate 18.8 (*)   ? Acid-base deficit 2.2 (*)   ? All other components within normal limits  ?CULTURE, BLOOD (ROUTINE X 2)  ?CULTURE, BLOOD (ROUTINE X 2)  ?RESP PANEL BY RT-PCR (FLU A&B, COVID) ARPGX2  ?URINE DRUG SCREEN, QUALITATIVE ([facility] ONLY)  ?ETHANOL  ?LACTIC ACID, PLASMA  ?LACTIC ACID, PLASMA  ?PROCALCITONIN  ?MAGNESIUM  ?BLOOD GAS, ARTERIAL  ?CBG MONITORING, ED  ?TROPONIN I (HIGH SENSITIVITY)  ?TROPONIN I (HIGH SENSITIVITY)  ? ? ? ?EKG: ? EKG Interpretation ? ?Date/Time:  Thursday Mar 24 2022 04:44:12 EDT ?Ventricular Rate:  72 ?PR Interval:    ?QRS Duration: 115 ?QT Interval:  451 ?QTC Calculation: 494 ?R Axis:   -20 ?Text Interpretation: Normal sinus rhythm Nonspecific intraventricular conduction delay Low voltage, precordial leads Artifact Confirmed by Rochele Raring [phone]) on 03/24/2022 4:55:51 AM ?  ? ?  ? ? ? ?RADIOLOGY: ?My personal review  and interpretation of imaging: CT head shows no acute abnormality.  Chest x-ray clear. ? ?I have personally reviewed all radiology reports.   ?CT HEAD WO CONTRAST ( ) ? ?Result Date: 03/24/2022 ?CLINICAL D

## 2022-03-24 NOTE — Progress Notes (Signed)
PHARMACIST - PHYSICIAN COMMUNICATION ? ?CONCERNING:  Enoxaparin (Lovenox) for DVT Prophylaxis  ? ? ?RECOMMENDATION: ?Patient was prescribed enoxaprin 40mg  q24 hours for VTE prophylaxis.  ? ?Filed Weights  ? 03/24/22 [id number] 03/24/22 [id number]  ?Weight: 99.8 kg (220 lb) 99.8 kg (220 lb)  ? ? ?Body mass index is 34.46 kg/m?. ? ?Estimated Creatinine Clearance: 62.9 mL/min (A) (by C-G formula based on SCr of 1.53 mg/dL (H)). ? ? ?Based on [place] patient is candidate for enoxaparin 0.5mg /kg TBW SQ every 24 hours based on BMI being >30. ? ?DESCRIPTION: ?Pharmacy has adjusted enoxaparin dose per [facility] policy. ? ?Patient is now receiving enoxaparin 0.5 mg/kg every 24 hours  ? ?Renda Rolls, PharmD, MBA ?03/24/2022 ?6:56 AM ? ? ?

## 2022-03-24 NOTE — ED Notes (Signed)
Bare hugger placed on patient at this time. °

## 2022-03-24 NOTE — ED Notes (Signed)
Dr. Leonides Schanz notified of pt degrading status and unable to protect airway. RT notified. Preparing for intubation at this time ?

## 2022-03-24 NOTE — Sepsis Progress Note (Signed)
Following per sepsis protocol   

## 2022-03-24 NOTE — Progress Notes (Signed)
CODE SEPSIS - PHARMACY COMMUNICATION ? ?**Broad Spectrum Antibiotics should be administered within 1 hour of Sepsis diagnosis** ? ?Time Code Sepsis Called/Page Received: 0500 ? ?Antibiotics Ordered: Cefepime, Flagyl, Vancomycin ? ?Time of 1st antibiotic administration: 0537 ? ?Otelia Sergeant, PharmD, MBA ?03/24/2022 ?5:09 AM ? ? ?

## 2022-03-25 ENCOUNTER — Inpatient Hospital Stay: Payer: Self-pay

## 2022-03-25 LAB — PROTIME-INR
INR: 2.1 — ABNORMAL HIGH (ref 0.8–1.2)
Prothrombin Time: 23.2 seconds — ABNORMAL HIGH (ref 11.4–15.2)

## 2022-03-25 LAB — GLUCOSE, CAPILLARY
Glucose-Capillary: 111 mg/dL — ABNORMAL HIGH (ref 70–99)
Glucose-Capillary: 112 mg/dL — ABNORMAL HIGH (ref 70–99)
Glucose-Capillary: 123 mg/dL — ABNORMAL HIGH (ref 70–99)
Glucose-Capillary: 126 mg/dL — ABNORMAL HIGH (ref 70–99)
Glucose-Capillary: 146 mg/dL — ABNORMAL HIGH (ref 70–99)
Glucose-Capillary: 88 mg/dL (ref 70–99)

## 2022-03-25 LAB — CBC
HCT: 24.3 % — ABNORMAL LOW (ref 39.0–52.0)
Hemoglobin: 8.2 g/dL — ABNORMAL LOW (ref 13.0–17.0)
MCH: 31.7 pg (ref 26.0–34.0)
MCHC: 33.7 g/dL (ref 30.0–36.0)
MCV: 93.8 fL (ref 80.0–100.0)
Platelets: 96 10*3/uL — ABNORMAL LOW (ref 150–400)
RBC: 2.59 MIL/uL — ABNORMAL LOW (ref 4.22–5.81)
RDW: 15 % (ref 11.5–15.5)
WBC: 6.8 10*3/uL (ref 4.0–10.5)
nRBC: 0 % (ref 0.0–0.2)

## 2022-03-25 LAB — COMPREHENSIVE METABOLIC PANEL
ALT: 15 U/L (ref 0–44)
AST: 30 U/L (ref 15–41)
Albumin: 2.4 g/dL — ABNORMAL LOW (ref 3.5–5.0)
Alkaline Phosphatase: 133 U/L — ABNORMAL HIGH (ref 38–126)
Anion gap: 11 (ref 5–15)
BUN: 29 mg/dL — ABNORMAL HIGH (ref 6–20)
CO2: 21 mmol/L — ABNORMAL LOW (ref 22–32)
Calcium: 7.8 mg/dL — ABNORMAL LOW (ref 8.9–10.3)
Chloride: 108 mmol/L (ref 98–111)
Creatinine, Ser: 1.68 mg/dL — ABNORMAL HIGH (ref 0.61–1.24)
GFR, Estimated: 48 mL/min — ABNORMAL LOW (ref >60)
Glucose, Bld: 107 mg/dL — ABNORMAL HIGH (ref 70–99)
Potassium: 3.6 mmol/L (ref 3.5–5.1)
Sodium: 140 mmol/L (ref 135–145)
Total Bilirubin: 1.7 mg/dL — ABNORMAL HIGH (ref 0.3–1.2)
Total Protein: 5.5 g/dL — ABNORMAL LOW (ref 6.5–8.1)

## 2022-03-25 LAB — MAGNESIUM: Magnesium: 1.7 mg/dL (ref 1.7–2.4)

## 2022-03-25 LAB — PHOSPHORUS: Phosphorus: 4.6 mg/dL (ref 2.5–4.6)

## 2022-03-25 LAB — PATHOLOGIST SMEAR REVIEW

## 2022-03-25 LAB — TRIGLYCERIDES: Triglycerides: 59 mg/dL (ref <150)

## 2022-03-25 LAB — AMMONIA: Ammonia: 109 umol/L — ABNORMAL HIGH (ref 9–35)

## 2022-03-25 MED ORDER — POTASSIUM CHLORIDE 20 MEQ PO PACK
40.0000 meq | PACK | Freq: Once | ORAL | Status: DC
  Filled 2022-03-25: qty 2

## 2022-03-25 MED ORDER — ENOXAPARIN SODIUM 40 MG/0.4ML IJ SOSY
40.0000 mg | PREFILLED_SYRINGE | INTRAMUSCULAR | Status: DC
Start: 2022-03-25 — End: 2022-03-29
  Administered 2022-03-25 – 2022-03-28 (×4): 40 mg via SUBCUTANEOUS
  Filled 2022-03-25 (×4): qty 0.4

## 2022-03-25 MED ORDER — MAGNESIUM SULFATE 2 GM/50ML IV SOLN
2.0000 g | Freq: Once | INTRAVENOUS | Status: AC
  Administered 2022-03-25: 2 g via INTRAVENOUS
  Filled 2022-03-25: qty 50

## 2022-03-25 MED ORDER — POTASSIUM CHLORIDE 20 MEQ PO PACK
40.0000 meq | PACK | Freq: Once | ORAL | Status: AC
  Administered 2022-03-25: 40 meq
  Filled 2022-03-25: qty 2

## 2022-03-25 MED ORDER — MAGNESIUM SULFATE 2 GM/50ML IV SOLN
2.0000 g | Freq: Once | INTRAVENOUS | Status: DC
  Filled 2022-03-25: qty 50

## 2022-03-25 NOTE — Progress Notes (Signed)
? ?NAME:  Shawn Torres, MRN:  [id number], DOB:  [birth year], LOS: 1 ?ADMISSION DATE:  03/24/2022, CONSULTATION DATE:  03/24/2022 ?REFERRING MD:  Dr Leonides Schanz, CHIEF COMPLAINT:  AMS  ? ?Brief Pt Description / Synopsis:  ?56 year old male admitted with acute hepatic encephalopathy requiring intubation for airway protection. ? ?History of Present Illness:  ?Shawn Torres is a 56 year old male with a past medical history significant for alcohol induced cirrhosis, recurrent hepatic encephalopathy, diabetes mellitus who presented to [facility] ED on 03/24/2022 due to altered mental status.  Patient remains altered, intubated and sedated with no family available, therefore history is obtained from ED and nursing notes. ? ?Per chart review his daughter reports the patient ran out of his lactulose yesterday, and was unable to get it refilled as he was not able to get in touch with his physician in time.  Normally he takes it every 3-4 hours.  His last known well time was 9:30 PM last night before going to bed when he was acting normally.  She checked on him in the middle the night and found him altered thus contacted EMS.  Daughter reports he is not drinking alcohol in over a year.  She denies any known history of fever, cough, abdominal pain, diarrhea. ? ?ED Course: ?Initial Vital Signs: Temperature 96.6 ?F, pulse 75, respiratory rate 20, blood pressure 151/94, SPO2 100% ?Significant Labs: Bicarbonate 18, glucose 274, BUN 26, creatinine 1.53, alkaline phosphatase 182, AST 30, ALT 17, ammonia 338, total bilirubin 2.3, high-sensitivity troponin 6, lactic acid 2.5, procalcitonin less than 0.1, WBC 5.5, hemoglobin 9.7, hematocrit 27.5, platelets 122, INR 1.8, PT 20.6, ethyl alcohol less than 10 ?ABG: pH 7.52/PCO2 23/PO2 117/bicarb 18.8 ?Urinalysis is negative for UTI ?Urine drug screen is negative ?Imaging ?Chest X-ray>>IMPRESSION: ?Stable low volume chest with cardiomegaly.  No acute finding. ?CT head>>IMPRESSION: ?No acute intracranial CT  findings or interval changes. There is mild ?atrophy and small-vessel disease, but more than typically seen at ?this age. No new abnormality. ?Medications Administered: 2 L normal saline boluses, IV cefepime, Flagyl, vancomycin, rectal lactulose ? ?On reevaluation by ED provider patient was noted to be clenching his jaw with blood in his mouth from biting his tongue.  No seizure-like activity noted.  Given concern for ability to protect his airway decision was made to intubate. ? ?PCCM asked to admit for further work-up and treatment of acute hepatic encephalopathy. ? ?Pertinent  Medical History  ? ?Past Medical History:  ?Diagnosis Date  ? Ascites   ? Cirrhosis, alcoholic ([place])   ? Diabetes mellitus without complication ([place])   ? Memory loss   ? ? ?Micro Data:  ?5/4: Blood culture x2>> ?5/4: Paracentesis fluid>> ? ?Antimicrobials:  ?Cefepime 5/4 x 1 dose ?Flagyl 5/4 x 1 dose ?Vancomycin 5/4 x 1 dose ?Ceftriaxone 5/4>> ?Rifaximin 5/4>> ? ?Significant Hospital Events: ?Including procedures, antibiotic start and stop dates in addition to other pertinent events   ?5/4: Required intubation in the ED for airway protection due to hepatic encephalopathy.  PCCM asked to admit.  IR consulted for paracentesis ?5/5 remains intubated, severe end stage liver disease ? ?Interim History / Subjective:  ?On vent ?Remains critically ill ?Started lactulose ?Therapy for SBP ?-bicarb drip ?-Remains hypothermic, hemodynamically stable, no vasopressors ?Severe end stage liver disease and failure ? ?Objective   ?Blood pressure 108/69, pulse 75, temperature 99.5 ?F (37.5 ?C), resp. rate 11, height '5\' 7"'  (1.702 m), weight 77.1 kg, SpO2 100 %. ?   ?Vent Mode: PRVC ?FiO2 (%):  [28 %]  28 % ?Set Rate:  [18 bmp] 18 bmp ?Vt Set:  [420 mL] 420 mL ?PEEP:  [2 cmH20-5 cmH20] 5 cmH20  ? ?Intake/Output Summary (Last 24 hours) at 03/25/2022 0711 ?Last data filed at 03/25/2022 0400 ?Gross per 24 hour  ?Intake 1906.82 ml  ?Output 935 ml  ?Net 971.82 ml   ? ? ?Filed Weights  ? 03/24/22 8099 03/24/22 8338 03/25/22 0322  ?Weight: 99.8 kg 99.8 kg 77.1 kg  ? ? ?REVIEW OF SYSTEMS ? ?PATIENT IS UNABLE TO PROVIDE COMPLETE REVIEW OF SYSTEMS DUE TO SEVERE CRITICAL ILLNESS AND TOXIC METABOLIC ENCEPHALOPATHY ? ? ? ?PHYSICAL EXAMINATION: ? ?GENERAL:critically ill appearing, +resp distress ?EYES: Pupils equal, round, reactive to light.  No scleral icterus.  ?MOUTH: Moist mucosal membrane. INTUBATED ?NECK: Supple.  ?PULMONARY: +rhonchi, +wheezing ?CARDIOVASCULAR: S1 and S2.  No murmurs  ?GASTROINTESTINAL: Soft, nontender, -distended. Positive bowel sounds.  ?MUSCULOSKELETAL: No swelling, clubbing, or edema.  ?NEUROLOGIC: obtunded ?SKIN:intact,warm,dry ? ? ?Assessment & Plan:  ? ?56 yo with end stage liver disease due to ETOH cirrhosis with severe hypoxic resp failure and Intubated for airway protection due to hepatic encephalopathy ? ?Severe ACUTE Hypoxic and Hypercapnic Respiratory Failure ?-continue Mechanical Ventilator support ?-Wean Fio2 and PEEP as tolerated ?-VAP/VENT bundle implementation ?- Wean PEEP & FiO2 as tolerated, maintain SpO2 > 88% ?- Head of bed elevated 30 degrees, VAP protocol in place ?- Plateau pressures less than 30 cm H20  ?- Intermittent chest x-ray & ABG PRN ?- Ensure adequate pulmonary hygiene  ?-will perform SAT/SBT when respiratory parameters are met ? ? ?NEUROLOGY ?ACUTE TOXIC METABOLIC ENCEPHALOPATHY DUE TO HEPATIC ENCEPHALOPATHY ?-need for sedation ?Lactulose and rifaxamin therapy ?-CT head negative for acute intracranial abnormality ?-Continue lactulose 30 mg 3 times daily ?-Intermittently follow ammonia level ?WUA daily ? ? ? ?GI ?Alcohol induced liver cirrhosis with ascites ?Elevated alkaline phosphatase ?-Trend LFTs and coags ?-Cover empirically for SBP with ceftriaxone and rifaximin ? ? ?ACUTE KIDNEY INJURY/Renal Failure ?-continue Foley Catheter-assess need ?-Avoid nephrotoxic agents ?-Follow urine output, BMP ?-Ensure adequate renal  perfusion, optimize oxygenation ?-Renal dose medications ? ? ?Intake/Output Summary (Last 24 hours) at 03/25/2022 0715 ?Last data filed at 03/25/2022 0400 ?Gross per 24 hour  ?Intake 1906.82 ml  ?Output 935 ml  ?Net 971.82 ml  ? ? ?  Latest Ref Rng & Units 03/25/2022  ?  4:44 AM 03/24/2022  ?  4:47 AM 02/17/2022  ?  4:14 AM  ?BMP  ?Glucose 70 - 99 mg/dL 107   274   179    ?BUN 6 - 20 mg/dL '29   26   29    ' ?Creatinine 0.61 - 1.24 mg/dL 1.68   1.53   1.16    ?Sodium 135 - 145 mmol/L 140   138   138    ?Potassium 3.5 - 5.1 mmol/L 3.6   4.1   3.5    ?Chloride 98 - 111 mmol/L 108   112   112    ?CO2 22 - 32 mmol/L '21   18   21    ' ?Calcium 8.9 - 10.3 mg/dL 7.8   8.2   7.5    ? ?ACUTE ANEMIA- ?TRANSFUSE AS NEEDED ?CONSIDER TRANSFUSION  IF HGB<7 ?DVT PRX with TED/SCD's ONLY ? ? ?ENDO ?- ICU hypoglycemic\Hyperglycemia protocol ?-check FSBS per protocol ? ? ?GI ?GI PROPHYLAXIS as indicated ? ?NUTRITIONAL STATUS ?DIET-->TF's as tolerated ?Constipation protocol as indicated ? ? ?ELECTROLYTES ?-follow labs as needed ?-replace as needed ?-pharmacy consultation and following ? ? ? ? ?  Best Practice (right click and "Reselect all SmartList Selections" daily)  ? ?Diet/type: NPO ?DVT prophylaxis: LMWH ?GI prophylaxis: PPI ?Lines: N/A ?Foley:  Yes, and it is still needed ?Code Status:  full code ? ? ?Labs   ?CBC: ?Recent Labs  ?Lab 03/24/22 ?7737 03/25/22 ?0444  ?WBC 5.5 6.8  ?HGB 9.7* 8.2*  ?HCT 27.5* 24.3*  ?MCV 90.8 93.8  ?PLT 122* 96*  ? ? ? ?Basic Metabolic Panel: ?Recent Labs  ?Lab 03/24/22 ?5051 03/24/22 ?0712 03/25/22 ?0444  ?NA 138  --  140  ?K 4.1  --  3.6  ?CL 112*  --  108  ?CO2 18*  --  21*  ?GLUCOSE 274*  --  107*  ?BUN 26*  --  29*  ?CREATININE 1.53*  --  1.68*  ?CALCIUM 8.2*  --  7.8*  ?MG  --  1.8 1.7  ?PHOS  --   --  4.6  ? ? ?GFR: ?Estimated Creatinine Clearance: 47 mL/min (A) (by C-G formula based on SCr of 1.68 mg/dL (H)). ?Recent Labs  ?Lab 03/24/22 ?5247 03/24/22 ?9980 03/25/22 ?0444  ?PROCALCITON  --  <0.10  --   ?WBC  5.5  --  6.8  ?LATICACIDVEN 2.5* 2.0*  --   ? ? ? ?Liver Function Tests: ?Recent Labs  ?Lab 03/24/22 ?0123 03/25/22 ?0444  ?AST 30 30  ?ALT 17 15  ?ALKPHOS 182* 133*  ?BILITOT 2.3* 1.7*  ?PROT 6.6 5.5*  ?ALBUMIN 2.7

## 2022-03-25 NOTE — Progress Notes (Signed)
PHARMACY CONSULT NOTE ? ?Pharmacy Consult for Electrolyte Monitoring and Replacement  ? ?Recent Labs: ?Potassium (mmol/L)  ?Date Value  ?03/25/2022 3.6  ? ?Magnesium (mg/dL)  ?Date Value  ?03/25/2022 1.7  ? ?Calcium (mg/dL)  ?Date Value  ?03/25/2022 7.8 (L)  ? ?Albumin (g/dL)  ?Date Value  ?03/25/2022 2.4 (L)  ? ?Phosphorus (mg/dL)  ?Date Value  ?03/25/2022 4.6  ? ?Sodium (mmol/L)  ?Date Value  ?03/25/2022 140  ?Corrected calcium: 9.08 mg/dl ? ?Assessment: ?56 year old male admitted with hepatic encephalopathy. Reportedly nonadherent to PTA lactulose. PMH includes cirrhosis, diabetes mellitus (A1c 5.1%).  ? ?On sodium bicarbonate @75  mL/hr. Receiving lactulose 30 grams TID and rifaximin. ? ?Goal of Therapy:  ?Electrolytes within normal limits ? ?Plan:  ?K 3.6. Kcl 40 mEq x 1.  ?Mag 1.7. Magnesium sulfate 2 grams x 1 ?Follow up labs in AM ? ?Wynelle Cleveland, PharmD ?Pharmacy Resident  ?03/25/2022 ?12:34 PM ?

## 2022-03-25 NOTE — Progress Notes (Signed)
?   03/25/22 1100  ?Clinical Encounter Type  ?Visited With Patient and family together  ?Visit Type Follow-up  ?Spiritual Encounters  ?Spiritual Needs Prayer  ? ?Chaplain provided follow-up support ?

## 2022-03-25 NOTE — Progress Notes (Signed)
Back on volume and rate due to spontaneous rate of 8 and low min volumes.  ?

## 2022-03-25 NOTE — TOC Initial Note (Signed)
Transition of Care (TOC) - Initial/Assessment Note  ? ? ?Patient Details  ?Name: Shawn Torres ?MRN: [id number] ?Date of Birth: November 25, 1965 ? ?Transition of Care (TOC) CM/SW Contact:    ?Shelbie Hutching, RN ?Phone Number: ?03/25/2022, 2:40 PM ? ?Clinical Narrative:                 ?Patient admitted to the hospital with acute hepatic encephalopathy.  Patient was in the hospital in March for same.  Currently in the ICU intubated and sedated.  TOC will follow for needs.   ? ?Expected Discharge Plan:  (TBD) ?Barriers to Discharge: Continued Medical Work up ? ? ?Patient Goals and CMS Choice ?Patient states their goals for this hospitalization and ongoing recovery are:: Intubated and sedated ?  ?  ? ?Expected Discharge Plan and Services ?Expected Discharge Plan:  (TBD) ?  ?  ?  ?Living arrangements for the past 2 months: Mobile Home ?                ?DME Arranged: N/A ?DME Agency: NA ?  ?  ?  ?  ?  ?  ?  ?  ? ?Prior Living Arrangements/Services ?Living arrangements for the past 2 months: Mobile Home ?Lives with:: Significant Other ?Patient language and need for interpreter reviewed:: Yes (Spanish) ?       ?Need for Family Participation in Patient Care: Yes (Comment) ?Care giver support system in place?: Yes (comment) (children) ?  ?Criminal Activity/Legal Involvement Pertinent to Current Situation/Hospitalization: No - Comment as needed ? ?Activities of Daily Living ?  ?  ? ?Permission Sought/Granted ?  ?  ?   ?   ?   ?   ? ?Emotional Assessment ?  ?Attitude/Demeanor/Rapport: Intubated (Following Commands or Not Following Commands) ?Affect (typically observed): Unable to Assess ?  ?Alcohol / Substance Use: Alcohol Use ?Psych Involvement: No (comment) ? ?Admission diagnosis:  Hepatic encephalopathy ([place]) [K76.82] ?Urinary retention [R33.9] ?Respiratory alkalosis [E87.3] ?AKI (acute kidney injury) ([place]) [N17.9] ?Acute hepatic encephalopathy ([place]) [K76.82] ?Hypothermia, initial encounter [T68.XXXA] ?Patient Active Problem List   ? Diagnosis Date Noted  ? Acute hepatic encephalopathy ([place]) 02/13/2022  ? Abdominal pain 12/18/2021  ? 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 Ascites   ? Acute upper GI bleed 12/13/2021  ? Acute blood loss anemia (ABLA) 12/11/2021  ? 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 Confusion   ? SOB (shortness of breath)   ? Lactic acidosis 11/06/2021  ? GERD (gastroesophageal reflux disease) 11/06/2021  ? BPH (benign prostatic hyperplasia) 11/06/2021  ? Stage 3a chronic kidney disease (CKD) ([place]) 11/03/2021  ? Macrocytic anemia 11/03/2021  ? Alcoholic cirrhosis of liver with ascites ([place]) 10/29/2021  ? Alcohol use disorder, severe, in early remission ([place])   ? Malnutrition of moderate degree 10/15/2021  ? Abdominal distention   ? Acute respiratory failure with hypoxia ([place])   ? Secondary esophageal varices with bleeding ([facility])   ? Hematemesis 10/10/2021  ? Hepatic encephalopathy ([place])   ? Secondary esophageal varices without bleeding ([place])   ? Thrombocytopenia ([place]) 04/30/2021  ? Type 2 diabetes mellitus, without long-term current use of insulin ([place]) 04/30/2021  ? Hyponatremia 05/24/2019  ? Pneumonia due to COVID-19 virus 05/24/2019  ? Transaminitis 05/24/2019  ? Alcohol dependence with uncomplicated withdrawal ([place]) 05/24/2019  ? Hypokalemia 05/24/2019  ? COVID-19 05/24/2019  ? ?PCP:  Caryl Never, MD ?Pharmacy:   ?[id number] - [place] [address] - [address] ?[address] ?[place] [address] ?Phone: [phone] Fax:  [phone] ? ? ? ? ?Social Determinants of Health (SDOH) Interventions ?  ? ?Readmission Risk Interventions ? ?  12/13/2021  ?  4:10 PM 10/12/2021  ? 12:41 PM  ?Readmission Risk Prevention Plan  ?Transportation Screening Complete Complete  ?PCP or Specialist Appt within 3-5 Days  Complete  ?[place] or Home Care Consult  Not Complete  ?[place] or Home Care Consult comments  unsure of discharge needs  ?Social Work Consult for [place] Planning/Counseling  Complete  ?Palliative Care Screening  Not  Applicable  ?Medication Review Press photographer) Complete Complete  ?SW Recovery Care/Counseling Consult Complete   ?Palliative Care Screening Not Applicable   ?[place] Not Applicable   ? ? ? ?

## 2022-03-25 NOTE — Progress Notes (Signed)
[facility] ADULT ICU REPLACEMENT PROTOCOL ? ? ?The patient does apply for the [facility] Adult ICU Electrolyte Replacment Protocol based on the criteria listed below:  ? ?1.Exclusion criteria: TCTS patients, ECMO patients, and Dialysis patients ?2. Is GFR >/= 30 ml/min? Yes.    ?Patient's GFR today is 48 ?3. Is SCr </= 2? Yes.   ?Patient's SCr is 1.68 mg/dL ?4. Did SCr increase >/= 0.5 in 24 hours? No. ?5.Pt's weight >40kg  Yes.   ?6. Abnormal electrolyte(s):   K 3.6, Mg 1.7  ?7. Electrolytes replaced per protocol ?8.  Call MD STAT for K+ </= 2.5, Phos </= 1, or Mag </= 1 ?Physician:  S. Sommer ? ?Wilburt Finlay 03/25/2022 5:55 AM ? ?

## 2022-03-25 NOTE — Plan of Care (Signed)
  Problem: Clinical Measurements: Goal: Respiratory complications will improve Outcome: Progressing   Problem: Clinical Measurements: Goal: Cardiovascular complication will be avoided Outcome: Progressing   

## 2022-03-26 ENCOUNTER — Inpatient Hospital Stay: Payer: Self-pay

## 2022-03-26 LAB — COMPREHENSIVE METABOLIC PANEL
ALT: 15 U/L (ref 0–44)
AST: 32 U/L (ref 15–41)
Albumin: 2.2 g/dL — ABNORMAL LOW (ref 3.5–5.0)
Alkaline Phosphatase: 141 U/L — ABNORMAL HIGH (ref 38–126)
Anion gap: 8 (ref 5–15)
BUN: 30 mg/dL — ABNORMAL HIGH (ref 6–20)
CO2: 26 mmol/L (ref 22–32)
Calcium: 7.8 mg/dL — ABNORMAL LOW (ref 8.9–10.3)
Chloride: 109 mmol/L (ref 98–111)
Creatinine, Ser: 1.46 mg/dL — ABNORMAL HIGH (ref 0.61–1.24)
GFR, Estimated: 57 mL/min — ABNORMAL LOW (ref >60)
Glucose, Bld: 185 mg/dL — ABNORMAL HIGH (ref 70–99)
Potassium: 3.6 mmol/L (ref 3.5–5.1)
Sodium: 143 mmol/L (ref 135–145)
Total Bilirubin: 1.7 mg/dL — ABNORMAL HIGH (ref 0.3–1.2)
Total Protein: 5.6 g/dL — ABNORMAL LOW (ref 6.5–8.1)

## 2022-03-26 LAB — CBC
HCT: 25.4 % — ABNORMAL LOW (ref 39.0–52.0)
Hemoglobin: 8.5 g/dL — ABNORMAL LOW (ref 13.0–17.0)
MCH: 31.8 pg (ref 26.0–34.0)
MCHC: 33.5 g/dL (ref 30.0–36.0)
MCV: 95.1 fL (ref 80.0–100.0)
Platelets: 99 10*3/uL — ABNORMAL LOW (ref 150–400)
RBC: 2.67 MIL/uL — ABNORMAL LOW (ref 4.22–5.81)
RDW: 15 % (ref 11.5–15.5)
WBC: 7 10*3/uL (ref 4.0–10.5)
nRBC: 0 % (ref 0.0–0.2)

## 2022-03-26 LAB — GLUCOSE, CAPILLARY
Glucose-Capillary: 164 mg/dL — ABNORMAL HIGH (ref 70–99)
Glucose-Capillary: 165 mg/dL — ABNORMAL HIGH (ref 70–99)
Glucose-Capillary: 168 mg/dL — ABNORMAL HIGH (ref 70–99)
Glucose-Capillary: 173 mg/dL — ABNORMAL HIGH (ref 70–99)
Glucose-Capillary: 181 mg/dL — ABNORMAL HIGH (ref 70–99)

## 2022-03-26 LAB — MAGNESIUM: Magnesium: 2.2 mg/dL (ref 1.7–2.4)

## 2022-03-26 LAB — PHOSPHORUS: Phosphorus: 4.5 mg/dL (ref 2.5–4.6)

## 2022-03-26 MED ORDER — POTASSIUM CHLORIDE 20 MEQ PO PACK
40.0000 meq | PACK | Freq: Once | ORAL | Status: AC
  Administered 2022-03-26: 40 meq
  Filled 2022-03-26: qty 2

## 2022-03-26 MED ORDER — INSULIN ASPART 100 UNIT/ML IJ SOLN
2.0000 [IU] | INTRAMUSCULAR | Status: DC
  Administered 2022-03-26 – 2022-03-27 (×4): 4 [IU] via SUBCUTANEOUS
  Filled 2022-03-26 (×4): qty 1

## 2022-03-26 NOTE — Progress Notes (Signed)
PHARMACY CONSULT NOTE ? ?Pharmacy Consult for Electrolyte Monitoring and Replacement  ? ?Recent Labs: ?Potassium (mmol/L)  ?Date Value  ?03/26/2022 3.6  ? ?Magnesium (mg/dL)  ?Date Value  ?03/26/2022 2.2  ? ?Calcium (mg/dL)  ?Date Value  ?03/26/2022 7.8 (L)  ? ?Albumin (g/dL)  ?Date Value  ?03/26/2022 2.2 (L)  ? ?Phosphorus (mg/dL)  ?Date Value  ?03/26/2022 4.5  ? ?Sodium (mmol/L)  ?Date Value  ?03/26/2022 143  ?Corrected calcium: 9.08 mg/dl ? ?Assessment: ?56 year old male admitted with hepatic encephalopathy. Reportedly nonadherent to PTA lactulose. PMH includes cirrhosis, diabetes mellitus (A1c 5.1%).  ? ?On sodium bicarbonate @75  mL/hr. Receiving lactulose 30 grams TID and rifaximin. ? ?Goal of Therapy:  ?Electrolytes within normal limits ? ?Plan:  ?K 3.6. Kcl 40 mEq x 1.  ?Follow up labs in AM ? ? , PharmD ?Clinical Pharmacist ?03/26/2022 ?8:21 AM ? ?

## 2022-03-26 NOTE — Progress Notes (Signed)
? ?NAME:  Shawn Torres, MRN:  [id number], DOB:  Jun 30, 1966, LOS: 2 ?ADMISSION DATE:  03/24/2022, CONSULTATION DATE:  03/24/2022 ?REFERRING MD:  Dr Leonides Schanz, CHIEF COMPLAINT:  AMS  ? ?Brief Pt Description / Synopsis:  ?56 year old male admitted with acute hepatic encephalopathy requiring intubation for airway protection. ? ?History of Present Illness:  ?Shawn Torres is a 56 year old male with a past medical history significant for alcohol induced cirrhosis, recurrent hepatic encephalopathy, diabetes mellitus who presented to [facility] ED on 03/24/2022 due to altered mental status.  Patient remains altered, intubated and sedated with no family available, therefore history is obtained from ED and nursing notes. ? ?Per chart review his daughter reports the patient ran out of his lactulose yesterday, and was unable to get it refilled as he was not able to get in touch with his physician in time.  Normally he takes it every 3-4 hours.  His last known well time was 9:30 PM last night before going to bed when he was acting normally.  She checked on him in the middle the night and found him altered thus contacted EMS.  Daughter reports he is not drinking alcohol in over a year.  She denies any known history of fever, cough, abdominal pain, diarrhea. ? ?ED Course: ?Initial Vital Signs: Temperature 96.6 ?F, pulse 75, respiratory rate 20, blood pressure 151/94, SPO2 100% ?Significant Labs: Bicarbonate 18, glucose 274, BUN 26, creatinine 1.53, alkaline phosphatase 182, AST 30, ALT 17, ammonia 338, total bilirubin 2.3, high-sensitivity troponin 6, lactic acid 2.5, procalcitonin less than 0.1, WBC 5.5, hemoglobin 9.7, hematocrit 27.5, platelets 122, INR 1.8, PT 20.6, ethyl alcohol less than 10 ?ABG: pH 7.52/PCO2 23/PO2 117/bicarb 18.8 ?Urinalysis is negative for UTI ?Urine drug screen is negative ?Imaging ?Chest X-ray>>IMPRESSION: ?Stable low volume chest with cardiomegaly.  No acute finding. ?CT head>>IMPRESSION: ?No acute intracranial CT  findings or interval changes. There is mild ?atrophy and small-vessel disease, but more than typically seen at ?this age. No new abnormality. ?Medications Administered: 2 L normal saline boluses, IV cefepime, Flagyl, vancomycin, rectal lactulose ? ?On reevaluation by ED provider patient was noted to be clenching his jaw with blood in his mouth from biting his tongue.  No seizure-like activity noted.  Given concern for ability to protect his airway decision was made to intubate. ? ?PCCM asked to admit for further work-up and treatment of acute hepatic encephalopathy. ? ?Pertinent  Medical History  ? ?Past Medical History:  ?Diagnosis Date  ? Ascites   ? Cirrhosis, alcoholic ([place])   ? Diabetes mellitus without complication ([place])   ? Memory loss   ? ? ?Micro Data:  ?5/4: Blood culture x2>> ?5/4: Paracentesis fluid>> ? ?Antimicrobials:  ?Cefepime 5/4 x 1 dose ?Flagyl 5/4 x 1 dose ?Vancomycin 5/4 x 1 dose ?Ceftriaxone 5/4>> ?Rifaximin 5/4>> ? ?Significant Hospital Events: ?Including procedures, antibiotic start and stop dates in addition to other pertinent events   ?5/4: Required intubation in the ED for airway protection due to hepatic encephalopathy.  PCCM asked to admit.  IR consulted for paracentesis ?5/5 remains intubated, severe end stage liver disease ?5/6 failed SAT ? ?Interim History / Subjective:  ?Remains on vent ?Plan for SAT/SBT today ?Remains critically ill ?Started lactulose ?Severe end stage liver disease and failure ? ?Vent Mode: PRVC ?FiO2 (%):  [28 %] 28 % ?Set Rate:  [14 bmp] 14 bmp ?Vt Set:  [520 mL] 520 mL ?PEEP:  [5 cmH20] 5 cmH20 ?Pressure Support:  [5 cmH20] 5 cmH20 ?Plateau Pressure:  [  11 cmH20] 11 cmH20 ? ? ?Objective   ?Blood pressure 113/76, pulse 83, temperature 98.4 ?F (36.9 ?C), resp. rate 14, height '5\' 7"'  (1.702 m), weight 78.9 kg, SpO2 100 %. ?   ?Vent Mode: PRVC ?FiO2 (%):  [28 %] 28 % ?Set Rate:  [14 bmp] 14 bmp ?Vt Set:  [520 mL] 520 mL ?PEEP:  [5 cmH20] 5 cmH20 ?Pressure Support:  [5  cmH20] 5 cmH20 ?Plateau Pressure:  [11 cmH20] 11 cmH20  ? ?Intake/Output Summary (Last 24 hours) at 03/26/2022 0736 ?Last data filed at 03/26/2022 0400 ?Gross per 24 hour  ?Intake 2566 ml  ?Output 1310 ml  ?Net 1256 ml  ? ? ?Filed Weights  ? 03/24/22 2671 03/25/22 0322 03/26/22 0331  ?Weight: 99.8 kg 77.1 kg 78.9 kg  ? ? ?REVIEW OF SYSTEMS ? ?PATIENT IS UNABLE TO PROVIDE COMPLETE REVIEW OF SYSTEMS DUE TO SEVERE CRITICAL ILLNESS AND TOXIC METABOLIC ENCEPHALOPATHY ? ? ? ?PHYSICAL EXAMINATION: ? ?GENERAL:critically ill appearing, +resp distress ?EYES: Pupils equal, round, reactive to light.  No scleral icterus.  ?MOUTH: Moist mucosal membrane. INTUBATED ?NECK: Supple.  ?PULMONARY: +rhonchi, +wheezing ?CARDIOVASCULAR: S1 and S2.  No murmurs  ?GASTROINTESTINAL: Soft, nontender, -distended. Positive bowel sounds.  ?MUSCULOSKELETAL: No swelling, clubbing, or edema.  ?NEUROLOGIC: obtunded ?SKIN:intact,warm,dry ? ? ? ?Assessment & Plan:  ? ?56 yo with end stage liver disease due to ETOH cirrhosis with severe hypoxic resp failure and Intubated for airway protection due to hepatic encephalopathy ? ?Severe ACUTE Hypoxic and Hypercapnic Respiratory Failure ?-continue Mechanical Ventilator support ?-Wean Fio2 and PEEP as tolerated ?-VAP/VENT bundle implementation ?- Wean PEEP & FiO2 as tolerated, maintain SpO2 > 88% ?- Head of bed elevated 30 degrees, VAP protocol in place ?- Plateau pressures less than 30 cm H20  ?- Intermittent chest x-ray & ABG PRN ?- Ensure adequate pulmonary hygiene  ?-will perform SAT/SBT when respiratory parameters are met ? ? ?NEUROLOGY ?ACUTE TOXIC METABOLIC ENCEPHALOPATHY DUE TO HEPATIC ENCEPHALOPATHY ?Lactulose and rifaxamin therapy ?-CT head negative for acute intracranial abnormality ?-Continue lactulose 30 mg 3 times daily ?-Intermittently follow ammonia level ?WUA daily ? ? ? ?GI ?Alcohol induced liver cirrhosis with ascites ?Elevated alkaline phosphatase ?-Trend LFTs and coags ?-Cover empirically for  SBP with ceftriaxone and rifaximin ? ?KIDNEYS ?Continue Foley Catheter-assess need ?-Avoid nephrotoxic agents ?-Follow urine output, BMP ?-Ensure adequate renal perfusion, optimize oxygenation ?-Renal dose medications ? ? ?Intake/Output Summary (Last 24 hours) at 03/26/2022 0737 ?Last data filed at 03/26/2022 0400 ?Gross per 24 hour  ?Intake 2566 ml  ?Output 1310 ml  ?Net 1256 ml  ? ? ? ?  Latest Ref Rng & Units 03/26/2022  ?  4:38 AM 03/25/2022  ?  4:44 AM 03/24/2022  ?  4:47 AM  ?BMP  ?Glucose 70 - 99 mg/dL 185   107   274    ?BUN 6 - 20 mg/dL '30   29   26    ' ?Creatinine 0.61 - 1.24 mg/dL 1.46   1.68   1.53    ?Sodium 135 - 145 mmol/L 143   140   138    ?Potassium 3.5 - 5.1 mmol/L 3.6   3.6   4.1    ?Chloride 98 - 111 mmol/L 109   108   112    ?CO2 22 - 32 mmol/L '26   21   18    ' ?Calcium 8.9 - 10.3 mg/dL 7.8   7.8   8.2    ? ?ACUTE ANEMIA- ?TRANSFUSE AS NEEDED ?CONSIDER TRANSFUSION  IF HGB<7 ? ? ?ENDO ?- ICU hypoglycemic\Hyperglycemia protocol ?-check FSBS per protocol ? ? ?GI ?GI PROPHYLAXIS as indicated ? ?NUTRITIONAL STATUS ?DIET-->TF's on hold ?Constipation protocol as indicated ? ? ?ELECTROLYTES ?-follow labs as needed ?-replace as needed ?-pharmacy consultation and following ? ? ? ? ?Best Practice (right click and "Reselect all SmartList Selections" daily)  ? ?Diet/type: NPO ?DVT prophylaxis: LMWH ?GI prophylaxis: PPI ?Lines: N/A ?Foley:  Yes, and it is still needed ?Code Status:  full code ? ? ?Labs   ?CBC: ?Recent Labs  ?Lab 03/24/22 ?9574 03/25/22 ?7340 03/26/22 ?3709  ?WBC 5.5 6.8 7.0  ?HGB 9.7* 8.2* 8.5*  ?HCT 27.5* 24.3* 25.4*  ?MCV 90.8 93.8 95.1  ?PLT 122* 96* 99*  ? ? ? ?Basic Metabolic Panel: ?Recent Labs  ?Lab 03/24/22 ?6438 03/24/22 ?3818 03/25/22 ?4037 03/26/22 ?5436  ?NA 138  --  140 143  ?K 4.1  --  3.6 3.6  ?CL 112*  --  108 109  ?CO2 18*  --  21* 26  ?GLUCOSE 274*  --  107* 185*  ?BUN 26*  --  29* 30*  ?CREATININE 1.53*  --  1.68* 1.46*  ?CALCIUM 8.2*  --  7.8* 7.8*  ?MG  --  1.8 1.7 2.2  ?PHOS  --    --  4.6 4.5  ? ? ?GFR: ?Estimated Creatinine Clearance: 54.1 mL/min (A) (by C-G formula based on SCr of 1.46 mg/dL (H)). ?Recent Labs  ?Lab 03/24/22 ?0677 03/24/22 ?0340 03/25/22 ?0444 03/26/22 ?[place]

## 2022-03-26 NOTE — Progress Notes (Signed)
Patient returned to previous PRVC mode due to excessive agitation, coughing, and vomiting multiple times. ?

## 2022-03-26 NOTE — Plan of Care (Signed)
?  Problem: Health Behavior/Discharge Planning: ?Goal: Ability to manage health-related needs will improve ?Outcome: Progressing ?  ?Problem: Clinical Measurements: ?Goal: Ability to maintain clinical measurements within normal limits will improve ?Outcome: Progressing ?Goal: Will remain free from infection ?Outcome: Progressing ?Goal: Diagnostic test results will improve ?Outcome: Progressing ?Goal: Respiratory complications will improve ?Outcome: Progressing ?Goal: Cardiovascular complication will be avoided ?Outcome: Progressing ?  ?Problem: Activity: ?Goal: Ability to tolerate increased activity will improve ?Outcome: Progressing ?  ?Problem: Respiratory: ?Goal: Ability to maintain a clear airway and adequate ventilation will improve ?Outcome: Progressing ?  ?Problem: Role Relationship: ?Goal: Method of communication will improve ?Outcome: Progressing ?  ?

## 2022-03-26 NOTE — Progress Notes (Signed)
RT assisted with patient transport to MRI and back to ICU. No complications occurred during transport. ?

## 2022-03-27 ENCOUNTER — Inpatient Hospital Stay: Payer: Self-pay

## 2022-03-27 LAB — GLUCOSE, CAPILLARY
Glucose-Capillary: 162 mg/dL — ABNORMAL HIGH (ref 70–99)
Glucose-Capillary: 165 mg/dL — ABNORMAL HIGH (ref 70–99)
Glucose-Capillary: 168 mg/dL — ABNORMAL HIGH (ref 70–99)
Glucose-Capillary: 104 mg/dL — ABNORMAL HIGH (ref 70–99)
Glucose-Capillary: 152 mg/dL — ABNORMAL HIGH (ref 70–99)
Glucose-Capillary: 131 mg/dL — ABNORMAL HIGH (ref 70–99)

## 2022-03-27 LAB — COMPREHENSIVE METABOLIC PANEL
ALT: 14 U/L (ref 0–44)
AST: 32 U/L (ref 15–41)
Albumin: 2.3 g/dL — ABNORMAL LOW (ref 3.5–5.0)
Alkaline Phosphatase: 145 U/L — ABNORMAL HIGH (ref 38–126)
Anion gap: 8 (ref 5–15)
BUN: 31 mg/dL — ABNORMAL HIGH (ref 6–20)
CO2: 30 mmol/L (ref 22–32)
Calcium: 7.7 mg/dL — ABNORMAL LOW (ref 8.9–10.3)
Chloride: 102 mmol/L (ref 98–111)
Creatinine, Ser: 1.3 mg/dL — ABNORMAL HIGH (ref 0.61–1.24)
GFR, Estimated: >60 mL/min (ref >60)
Glucose, Bld: 184 mg/dL — ABNORMAL HIGH (ref 70–99)
Potassium: 3.4 mmol/L — ABNORMAL LOW (ref 3.5–5.1)
Sodium: 140 mmol/L (ref 135–145)
Total Bilirubin: 1.6 mg/dL — ABNORMAL HIGH (ref 0.3–1.2)
Total Protein: 5.4 g/dL — ABNORMAL LOW (ref 6.5–8.1)

## 2022-03-27 LAB — MAGNESIUM: Magnesium: 2.4 mg/dL (ref 1.7–2.4)

## 2022-03-27 LAB — PHOSPHORUS: Phosphorus: 3.9 mg/dL (ref 2.5–4.6)

## 2022-03-27 LAB — CBC
HCT: 26 % — ABNORMAL LOW (ref 39.0–52.0)
Hemoglobin: 8.8 g/dL — ABNORMAL LOW (ref 13.0–17.0)
MCH: 32.2 pg (ref 26.0–34.0)
MCHC: 33.8 g/dL (ref 30.0–36.0)
MCV: 95.2 fL (ref 80.0–100.0)
Platelets: 90 10*3/uL — ABNORMAL LOW (ref 150–400)
RBC: 2.73 MIL/uL — ABNORMAL LOW (ref 4.22–5.81)
RDW: 14.9 % (ref 11.5–15.5)
WBC: 6.8 10*3/uL (ref 4.0–10.5)
nRBC: 0 % (ref 0.0–0.2)

## 2022-03-27 MED ORDER — LACTULOSE 10 GM/15ML PO SOLN
30.0000 g | Freq: Three times a day (TID) | ORAL | Status: DC
  Administered 2022-03-27 – 2022-03-29 (×6): 30 g via ORAL
  Filled 2022-03-27 (×6): qty 60

## 2022-03-27 MED ORDER — ADULT MULTIVITAMIN W/MINERALS CH
1.0000 | ORAL_TABLET | Freq: Every day | ORAL | Status: DC
  Administered 2022-03-28 – 2022-03-29 (×2): 1 via ORAL
  Filled 2022-03-27 (×2): qty 1

## 2022-03-27 MED ORDER — DOCUSATE SODIUM 50 MG/5ML PO LIQD: 100.0000 mg | Freq: Two times a day (BID) | ORAL | Status: DC | PRN

## 2022-03-27 MED ORDER — DOCUSATE SODIUM 50 MG PO CAPS
50.0000 mg | ORAL_CAPSULE | Freq: Two times a day (BID) | ORAL | Status: DC
  Filled 2022-03-27: qty 1

## 2022-03-27 MED ORDER — INSULIN ASPART 100 UNIT/ML IJ SOLN
0.0000 [IU] | Freq: Three times a day (TID) | INTRAMUSCULAR | Status: DC
  Administered 2022-03-28: 3 [IU] via SUBCUTANEOUS
  Administered 2022-03-28 – 2022-03-29 (×2): 2 [IU] via SUBCUTANEOUS
  Filled 2022-03-27 (×3): qty 1

## 2022-03-27 MED ORDER — PANTOPRAZOLE 2 MG/ML SUSPENSION: 40.0000 mg | Freq: Every day | ORAL | Status: DC

## 2022-03-27 MED ORDER — POTASSIUM CHLORIDE 20 MEQ PO PACK
40.0000 meq | PACK | Freq: Once | ORAL | Status: AC
  Administered 2022-03-27: 40 meq
  Filled 2022-03-27: qty 2

## 2022-03-27 MED ORDER — POLYETHYLENE GLYCOL 3350 17 G PO PACK: 17.0000 g | PACK | Freq: Every day | ORAL | Status: DC | PRN

## 2022-03-27 MED ORDER — RIFAXIMIN 550 MG PO TABS
550.0000 mg | ORAL_TABLET | Freq: Two times a day (BID) | ORAL | Status: DC
  Administered 2022-03-27 – 2022-03-29 (×4): 550 mg via ORAL
  Filled 2022-03-27 (×4): qty 1

## 2022-03-27 MED ORDER — DOCUSATE SODIUM 50 MG/5ML PO LIQD: 100.0000 mg | Freq: Two times a day (BID) | ORAL | Status: DC

## 2022-03-27 MED ORDER — POLYETHYLENE GLYCOL 3350 17 G PO PACK
17.0000 g | PACK | Freq: Every day | ORAL | Status: DC
Start: 2022-03-28 — End: 2022-03-27

## 2022-03-27 MED ORDER — DOCUSATE SODIUM 50 MG PO CAPS
50.0000 mg | ORAL_CAPSULE | Freq: Two times a day (BID) | ORAL | Status: DC | PRN
  Filled 2022-03-27: qty 1

## 2022-03-27 MED ORDER — THIAMINE HCL 100 MG PO TABS
100.0000 mg | ORAL_TABLET | Freq: Every day | ORAL | Status: DC
Start: 2022-03-28 — End: 2022-03-29
  Administered 2022-03-28 – 2022-03-29 (×2): 100 mg via ORAL
  Filled 2022-03-27 (×2): qty 1

## 2022-03-27 MED ORDER — PANTOPRAZOLE SODIUM 40 MG PO TBEC
40.0000 mg | DELAYED_RELEASE_TABLET | Freq: Every day | ORAL | Status: DC
  Administered 2022-03-28 – 2022-03-29 (×2): 40 mg via ORAL
  Filled 2022-03-27 (×2): qty 1

## 2022-03-27 MED ORDER — FOLIC ACID 1 MG PO TABS
1.0000 mg | ORAL_TABLET | Freq: Every day | ORAL | Status: DC
Start: 2022-03-28 — End: 2022-03-29
  Administered 2022-03-28 – 2022-03-29 (×2): 1 mg via ORAL
  Filled 2022-03-27 (×2): qty 1

## 2022-03-27 NOTE — Progress Notes (Signed)
SUCCESSFULLY EXTUBATED ? ?Patient sitting up. Ice chips given. No choking, coughing, or distress.  Water given and patient tolerated well. Diet ordered. ? ?BP 119/71   Pulse 79   Temp 98.9 ?F (37.2 ?C) (Oral)   Resp 17   Ht 5\' 7"  (1.702 m)   Wt 80.9 kg   SpO2 100%   BMI 27.93 kg/m?  ? ?SD status ?Transfer to [facility] 5/8 ? ? ? ? ?Corrin Parker, M.D.  ?[facility]  ?Medical Director [place] ?Medical Director [facility] Cardio-Pulmonary Department  ? ? ? ?

## 2022-03-27 NOTE — Progress Notes (Signed)
? ?NAME:  Shawn Torres, MRN:  [id number], DOB:  08/31/1966, LOS: 3 ?ADMISSION DATE:  03/24/2022, CONSULTATION DATE:  03/24/2022 ?REFERRING MD:  Dr Leonides Schanz, CHIEF COMPLAINT:  AMS  ? ?Brief Pt Description / Synopsis:  ?56 year old male admitted with acute hepatic encephalopathy requiring intubation for airway protection. ? ?History of Present Illness:  ?Shawn Torres is a 56 year old male with a past medical history significant for alcohol induced cirrhosis, recurrent hepatic encephalopathy, diabetes mellitus who presented to [facility] ED on 03/24/2022 due to altered mental status.  Patient remains altered, intubated and sedated with no family available, therefore history is obtained from ED and nursing notes. ? ?Per chart review his daughter reports the patient ran out of his lactulose yesterday, and was unable to get it refilled as he was not able to get in touch with his physician in time.  Normally he takes it every 3-4 hours.  His last known well time was 9:30 PM last night before going to bed when he was acting normally.  She checked on him in the middle the night and found him altered thus contacted EMS.  Daughter reports he is not drinking alcohol in over a year.  She denies any known history of fever, cough, abdominal pain, diarrhea. ? ?ED Course: ?Initial Vital Signs: Temperature 96.6 ?F, pulse 75, respiratory rate 20, blood pressure 151/94, SPO2 100% ?Significant Labs: Bicarbonate 18, glucose 274, BUN 26, creatinine 1.53, alkaline phosphatase 182, AST 30, ALT 17, ammonia 338, total bilirubin 2.3, high-sensitivity troponin 6, lactic acid 2.5, procalcitonin less than 0.1, WBC 5.5, hemoglobin 9.7, hematocrit 27.5, platelets 122, INR 1.8, PT 20.6, ethyl alcohol less than 10 ?ABG: pH 7.52/PCO2 23/PO2 117/bicarb 18.8 ?Urinalysis is negative for UTI ?Urine drug screen is negative ?Imaging ?Chest X-ray>>IMPRESSION: ?Stable low volume chest with cardiomegaly.  No acute finding. ?CT head>>IMPRESSION: ?No acute intracranial CT  findings or interval changes. There is mild ?atrophy and small-vessel disease, but more than typically seen at ?this age. No new abnormality. ?Medications Administered: 2 L normal saline boluses, IV cefepime, Flagyl, vancomycin, rectal lactulose ? ?On reevaluation by ED provider patient was noted to be clenching his jaw with blood in his mouth from biting his tongue.  No seizure-like activity noted.  Given concern for ability to protect his airway decision was made to intubate. ? ?PCCM asked to admit for further work-up and treatment of acute hepatic encephalopathy. ? ?Pertinent  Medical History  ? ?Past Medical History:  ?Diagnosis Date  ? Ascites   ? Cirrhosis, alcoholic ([place])   ? Diabetes mellitus without complication ([place])   ? Memory loss   ? ? ?Micro Data:  ?5/4: Blood culture x2>> ?5/4: Paracentesis fluid>> ? ?Antimicrobials:  ?Cefepime 5/4 x 1 dose ?Flagyl 5/4 x 1 dose ?Vancomycin 5/4 x 1 dose ?Ceftriaxone 5/4>> ?Rifaximin 5/4>> ? ?Significant Hospital Events: ?Including procedures, antibiotic start and stop dates in addition to other pertinent events   ?5/4: Required intubation in the ED for airway protection due to hepatic encephalopathy.  PCCM asked to admit.  IR consulted for paracentesis ?5/5 remains intubated, severe end stage liver disease ?5/6 failed SAT, MRO BRAIN NEG ? ? ?Interim History / Subjective:  ?Remains on vent ?Plan for SAT/SBT today ?On lactulose ?Severe end stage liver disease and cirrhosis ?Remains critically ill ? ? ?Vent Mode: PRVC ?FiO2 (%):  [28 %] 28 % ?Set Rate:  [14 bmp] 14 bmp ?Vt Set:  [520 mL] 520 mL ?PEEP:  [5 cmH20] 5 cmH20 ?Pressure Support:  [0 DUK02-5  cmH20] 0 cmH20 ?Plateau Pressure:  [14 cmH20-15 cmH20] 14 cmH20 ? ? ?Objective   ?Blood pressure 120/72, pulse 89, temperature 97.7 ?F (36.5 ?C), temperature source Oral, resp. rate 15, height '5\' 7"'  (1.702 m), weight 80.9 kg, SpO2 100 %. ?   ?Vent Mode: PRVC ?FiO2 (%):  [28 %] 28 % ?Set Rate:  [14 bmp] 14 bmp ?Vt Set:  [520  mL] 520 mL ?PEEP:  [5 cmH20] 5 cmH20 ?Pressure Support:  [0 cmH20-5 cmH20] 0 cmH20 ?Plateau Pressure:  [14 cmH20-15 cmH20] 14 cmH20  ? ?Intake/Output Summary (Last 24 hours) at 03/27/2022 0731 ?Last data filed at 03/27/2022 4166 ?Gross per 24 hour  ?Intake 2792.08 ml  ?Output 1615 ml  ?Net 1177.08 ml  ? ? ?Filed Weights  ? 03/25/22 0322 03/26/22 0331 03/27/22 0352  ?Weight: 77.1 kg 78.9 kg 80.9 kg  ? ? ?REVIEW OF SYSTEMS ? ?PATIENT IS UNABLE TO PROVIDE COMPLETE REVIEW OF SYSTEMS DUE TO SEVERE CRITICAL ILLNESS AND TOXIC METABOLIC ENCEPHALOPATHY ? ? ? ?PHYSICAL EXAMINATION: ? ?GENERAL:critically ill appearing, +resp distress ?EYES: Pupils equal, round, reactive to light.  No scleral icterus.  ?MOUTH: Moist mucosal membrane. INTUBATED ?NECK: Supple.  ?PULMONARY: +rhonchi, +wheezing ?CARDIOVASCULAR: S1 and S2.  No murmurs  ?GASTROINTESTINAL: Soft, nontender, -distended. Positive bowel sounds.  ?MUSCULOSKELETAL: No swelling, clubbing, or edema.  ?NEUROLOGIC: obtunded ?SKIN:intact,warm,dry ? ? ? ?Assessment & Plan:  ? ?56 yo with end stage liver disease due to ETOH cirrhosis with severe hypoxic resp failure and Intubated for airway protection due to hepatic encephalopathy ? ?Severe ACUTE Hypoxic and Hypercapnic Respiratory Failure ?-continue Mechanical Ventilator support ?-Wean Fio2 and PEEP as tolerated ?-VAP/VENT bundle implementation ?- Wean PEEP & FiO2 as tolerated, maintain SpO2 > 88% ?- Head of bed elevated 30 degrees, VAP protocol in place ?- Plateau pressures less than 30 cm H20  ?- Intermittent chest x-ray & ABG PRN ?- Ensure adequate pulmonary hygiene  ?-will perform SAT/SBT when respiratory parameters are met ? ? ?NEUROLOGY ?ACUTE TOXIC METABOLIC ENCEPHALOPATHY DUE TO HEPATIC ENCEPHALOPATHY ?MRI BRAIN NEG ?-Continue lactulose 30 mg 3 times daily ?-Intermittently follow ammonia level ?WUA daily ? ? ?GI ?Alcohol induced liver cirrhosis with ascites ?Elevated alkaline phosphatase ?-Trend LFTs and coags ?-Cover  empirically for SBP with ceftriaxone and rifaximin ? ?KIDNEYS ?-continue Foley Catheter-assess need ?-Avoid nephrotoxic agents ?-Follow urine output, BMP ?-Ensure adequate renal perfusion, optimize oxygenation ?-Renal dose medications ? ? ?Intake/Output Summary (Last 24 hours) at 03/27/2022 0733 ?Last data filed at 03/27/2022 0630 ?Gross per 24 hour  ?Intake 2792.08 ml  ?Output 1615 ml  ?Net 1177.08 ml  ? ? ?ENDO ?- ICU hypoglycemic\Hyperglycemia protocol ?-check FSBS per protocol ? ? ?GI ?GI PROPHYLAXIS as indicated ? ?NUTRITIONAL STATUS ?DIET-->TF's as tolerated ?Constipation protocol as indicated ? ? ?ELECTROLYTES ?-follow labs as needed ?-replace as needed ?-pharmacy consultation and following ? ? ?ACUTE ANEMIA- ?TRANSFUSE AS NEEDED ?CONSIDER TRANSFUSION  IF HGB<7 ? ? ? ? ?Best Practice (right click and "Reselect all SmartList Selections" daily)  ? ?Diet/type: NPO ?DVT prophylaxis: LMWH ?GI prophylaxis: PPI ?Lines: N/A ?Foley:  Yes, and it is still needed ?Code Status:  full code ? ? ?Labs   ?CBC: ?Recent Labs  ?Lab 03/24/22 ?1601 03/25/22 ?0932 03/26/22 ?3557 03/27/22 ?3220  ?WBC 5.5 6.8 7.0 6.8  ?HGB 9.7* 8.2* 8.5* 8.8*  ?HCT 27.5* 24.3* 25.4* 26.0*  ?MCV 90.8 93.8 95.1 95.2  ?PLT 122* 96* 99* 90*  ? ? ? ?Basic Metabolic Panel: ?Recent Labs  ?Lab 03/24/22 ?2542 03/24/22 ?7062  03/25/22 ?2633 03/26/22 ?3545 03/27/22 ?6256  ?NA 138  --  140 143 140  ?K 4.1  --  3.6 3.6 3.4*  ?CL 112*  --  108 109 102  ?CO2 18*  --  21* 26 30  ?GLUCOSE 274*  --  107* 185* 184*  ?BUN 26*  --  29* 30* 31*  ?CREATININE 1.53*  --  1.68* 1.46* 1.30*  ?CALCIUM 8.2*  --  7.8* 7.8* 7.7*  ?MG  --  1.8 1.7 2.2 2.4  ?PHOS  --   --  4.6 4.5 3.9  ? ? ?GFR: ?Estimated Creatinine Clearance: 66.2 mL/min (A) (by C-G formula based on SCr of 1.3 mg/dL (H)). ?Recent Labs  ?Lab 03/24/22 ?3893 03/24/22 ?7342 03/25/22 ?8768 03/26/22 ?1157 03/27/22 ?2620  ?PROCALCITON  --  <0.10  --   --   --   ?WBC 5.5  --  6.8 7.0 6.8  ?LATICACIDVEN 2.5* 2.0*  --   --   --    ? ? ? ?Liver Function Tests: ?Recent Labs  ?Lab 03/24/22 ?3559 03/25/22 ?7416 03/26/22 ?3845 03/27/22 ?3646  ?AST 30 30 32 32  ?ALT '17 15 15 14  ' ?ALKPHOS 182* 133* 141* 145*  ?BILITOT 2.3* 1.7* 1.7* 1.6*

## 2022-03-27 NOTE — Progress Notes (Signed)
Patient sitting up. Ice chips given. No choking, coughing, or distress.  Water given and patient tolerated well. Diet ordered. ?

## 2022-03-27 NOTE — Progress Notes (Signed)
PHARMACY CONSULT NOTE ? ?Pharmacy Consult for Electrolyte Monitoring and Replacement  ? ?Recent Labs: ?Potassium (mmol/L)  ?Date Value  ?03/27/2022 3.4 (L)  ? ?Magnesium (mg/dL)  ?Date Value  ?03/27/2022 2.4  ? ?Calcium (mg/dL)  ?Date Value  ?03/27/2022 7.7 (L)  ? ?Albumin (g/dL)  ?Date Value  ?03/27/2022 2.3 (L)  ? ?Phosphorus (mg/dL)  ?Date Value  ?03/27/2022 3.9  ? ?Sodium (mmol/L)  ?Date Value  ?03/27/2022 140  ?Corrected calcium: 8.98 mg/dl ? ?Assessment: ?56 year old male admitted with hepatic encephalopathy. Reportedly nonadherent to PTA lactulose. PMH includes cirrhosis, diabetes mellitus (A1c 5.1%).  ? ?On sodium bicarbonate @75  mL/hr. Receiving lactulose 30 grams TID and rifaximin. ? ?Goal of Therapy:  ?Electrolytes within normal limits ? ?Plan:  ?K 3.4. Kcl 40 mEq x 1.  ?Follow up labs in AM ? ? , PharmD ?Clinical Pharmacist ?03/27/2022 ?9:24 AM ? ?

## 2022-03-27 NOTE — Progress Notes (Signed)
Patient extubated to 3l [place] per MD order, pt tolerating well at this time. Sats 99% on 3l [place]. ?

## 2022-03-28 ENCOUNTER — Other Ambulatory Visit: Payer: Self-pay

## 2022-03-28 DIAGNOSIS — K7031 Alcoholic cirrhosis of liver with ascites: Secondary | ICD-10-CM

## 2022-03-28 DIAGNOSIS — D696 Thrombocytopenia, unspecified: Secondary | ICD-10-CM

## 2022-03-28 LAB — GLUCOSE, CAPILLARY
Glucose-Capillary: 106 mg/dL — ABNORMAL HIGH (ref 70–99)
Glucose-Capillary: 202 mg/dL — ABNORMAL HIGH (ref 70–99)
Glucose-Capillary: 154 mg/dL — ABNORMAL HIGH (ref 70–99)
Glucose-Capillary: 201 mg/dL — ABNORMAL HIGH (ref 70–99)

## 2022-03-28 LAB — COMPREHENSIVE METABOLIC PANEL
ALT: 14 U/L (ref 0–44)
AST: 36 U/L (ref 15–41)
Albumin: 2.1 g/dL — ABNORMAL LOW (ref 3.5–5.0)
Alkaline Phosphatase: 126 U/L (ref 38–126)
Anion gap: 7 (ref 5–15)
BUN: 29 mg/dL — ABNORMAL HIGH (ref 6–20)
CO2: 30 mmol/L (ref 22–32)
Calcium: 7.9 mg/dL — ABNORMAL LOW (ref 8.9–10.3)
Chloride: 103 mmol/L (ref 98–111)
Creatinine, Ser: 1.3 mg/dL — ABNORMAL HIGH (ref 0.61–1.24)
GFR, Estimated: >60 mL/min (ref >60)
Glucose, Bld: 109 mg/dL — ABNORMAL HIGH (ref 70–99)
Potassium: 3.2 mmol/L — ABNORMAL LOW (ref 3.5–5.1)
Sodium: 140 mmol/L (ref 135–145)
Total Bilirubin: 1.9 mg/dL — ABNORMAL HIGH (ref 0.3–1.2)
Total Protein: 5.1 g/dL — ABNORMAL LOW (ref 6.5–8.1)

## 2022-03-28 LAB — PHOSPHORUS: Phosphorus: 3.3 mg/dL (ref 2.5–4.6)

## 2022-03-28 LAB — CBC
HCT: 24.1 % — ABNORMAL LOW (ref 39.0–52.0)
Hemoglobin: 8.3 g/dL — ABNORMAL LOW (ref 13.0–17.0)
MCH: 33.6 pg (ref 26.0–34.0)
MCHC: 34.4 g/dL (ref 30.0–36.0)
MCV: 97.6 fL (ref 80.0–100.0)
Platelets: 81 10*3/uL — ABNORMAL LOW (ref 150–400)
RBC: 2.47 MIL/uL — ABNORMAL LOW (ref 4.22–5.81)
RDW: 14.7 % (ref 11.5–15.5)
WBC: 6.1 10*3/uL (ref 4.0–10.5)
nRBC: 0 % (ref 0.0–0.2)

## 2022-03-28 LAB — BODY FLUID CULTURE W GRAM STAIN
Culture: NO GROWTH
Gram Stain: NONE SEEN

## 2022-03-28 LAB — MAGNESIUM: Magnesium: 2.2 mg/dL (ref 1.7–2.4)

## 2022-03-28 MED ORDER — POTASSIUM CHLORIDE CRYS ER 20 MEQ PO TBCR
40.0000 meq | EXTENDED_RELEASE_TABLET | Freq: Once | ORAL | Status: AC
  Administered 2022-03-28: 40 meq via ORAL
  Filled 2022-03-28: qty 2

## 2022-03-28 MED ORDER — ENSURE ENLIVE PO LIQD
237.0000 mL | Freq: Three times a day (TID) | ORAL | Status: DC
  Administered 2022-03-28 – 2022-03-29 (×2): 237 mL via ORAL

## 2022-03-28 MED ORDER — POTASSIUM CHLORIDE CRYS ER 20 MEQ PO TBCR
20.0000 meq | EXTENDED_RELEASE_TABLET | Freq: Once | ORAL | Status: AC
  Administered 2022-03-28: 20 meq via ORAL
  Filled 2022-03-28: qty 1

## 2022-03-28 NOTE — TOC Progression Note (Signed)
Transition of Care (TOC) - Progression Note  ? ? ?Patient Details  ?Name: Shawn Torres ?MRN: [id number] ?Date of Birth: 1966/04/21 ? ?Transition of Care (TOC) CM/SW Contact  ?Gildardo Griffes, LCSW ?Phone Number: ?03/28/2022, 11:47 AM ? ?Clinical Narrative:    ? ?CSW spoke with patient's daughter Philis Pique whom he lives with. She reports patient's PCP is Dr. Talbert Forest, she reports she usually gets his prescriptions from [facility] or [facility] in [place].  ? ?Patient has no insurance daughter confirms, agreeable for CSW to send referral to financial counselor to see if patient qualifies for medicaid. Referral has been sent.  ? ?Daughter in agreement with medications being sent to medication management at time of discharge.  ?She reports no other needs at this time.  ? ? ? ?Expected Discharge Plan: Home/Self Care ?Barriers to Discharge: Continued Medical Work up ? ?Expected Discharge Plan and Services ?Expected Discharge Plan: Home/Self Care ?  ?  ?  ?Living arrangements for the past 2 months: Mobile Home ?                ?DME Arranged: N/A ?DME Agency: NA ?  ?  ?  ?  ?  ?  ?  ?  ? ? ?Social Determinants of Health (SDOH) Interventions ?  ? ?Readmission Risk Interventions ? ?  12/13/2021  ?  4:10 PM 10/12/2021  ? 12:41 PM  ?Readmission Risk Prevention Plan  ?Transportation Screening Complete Complete  ?PCP or Specialist Appt within 3-5 Days  Complete  ?HRI or Home Care Consult  Not Complete  ?HRI or Home Care Consult comments  unsure of discharge needs  ?Social Work Consult for Recovery Care Planning/Counseling  Complete  ?Palliative Care Screening  Not Applicable  ?Medication Review Oceanographer) Complete Complete  ?SW Recovery Care/Counseling Consult Complete   ?Palliative Care Screening Not Applicable   ?Skilled Nursing Facility Not Applicable   ? ? ?

## 2022-03-28 NOTE — Evaluation (Signed)
Occupational Therapy Evaluation ?Patient Details ?Name: Shawn Torres ?MRN: [id number] ?DOB: November 17, 1966 ?Today's Date: 03/28/2022 ? ? ?History of Present Illness Shawn Torres is a 56yoM who [facility] on [facility] c AMS afte rrunning out of lactulose at home. Pt required intubation upon arrival. Pt extubated to 3L on 5/7. PMH: ETOH abuse, liver cirrhosis c weekly paracentesis, DM2, hepatic encephalopathy.  ? ?Clinical Impression ?  ?Patient presenting with decreased Ind in self care, balance, functional mobility/transfers, endurance, and safety awareness. Patient reports being independent at baseline and living with family at home. Use of stratus interpreter this session. Patient currently functioning at min - mod A overall for functional mobility and self care. Pt is able to stand and take several side steps along EOB but fatigues very quickly. Patient will benefit from acute OT to increase overall independence in the areas of ADLs, functional mobility, and safety awareness in order to safely discharge home with family.  ?   ? ?Recommendations for follow up therapy are one component of a multi-disciplinary discharge planning process, led by the attending physician.  Recommendations may be updated based on patient status, additional functional criteria and insurance authorization.  ? ?Follow Up Recommendations ? Home health OT  ?  ?Assistance Recommended at Discharge Intermittent Supervision/Assistance  ?Patient can return home with the following A little help with walking and/or transfers;A little help with bathing/dressing/bathroom;Help with stairs or ramp for entrance;Assist for transportation;Assistance with cooking/housework;Direct supervision/assist for financial management;Direct supervision/assist for medications management ? ?  ?Functional Status Assessment ? Patient has had a recent decline in their functional status and demonstrates the ability to make significant improvements in function in a reasonable  and predictable amount of time.  ?Equipment Recommendations ? None recommended by OT  ?  ?   ?Precautions / Restrictions Precautions ?Precautions: Fall ?Restrictions ?Weight Bearing Restrictions: No  ? ?  ? ?Mobility Bed Mobility ?Overal bed mobility: Needs Assistance ?Bed Mobility: Supine to Sit, Sit to Supine ?  ?  ?Supine to sit: Mod assist ?Sit to supine: Mod assist ?  ?General bed mobility comments: heavy assist for trunk support and LEs ?  ? ?Transfers ?Overall transfer level: Needs assistance ?Equipment used: 1 person hand held assist ?Transfers: Sit to/from Stand ?Sit to Stand: Mod assist ?  ?  ?  ?  ?  ?  ?  ? ?  ?Balance Overall balance assessment: Needs assistance ?Sitting-balance support: Feet supported, Single extremity supported ?Sitting balance-Leahy Scale: Fair ?  ?  ?Standing balance support: During functional activity, Single extremity supported ?Standing balance-Leahy Scale: Poor ?  ?  ?  ?  ?  ?  ?  ?  ?  ?  ?  ?  ?   ? ?ADL either performed or assessed with clinical judgement  ? ?ADL Overall ADL's : Needs assistance/impaired ?  ?  ?  ?  ?  ?  ?  ?  ?  ?  ?Lower Body Dressing: Maximal assistance;Sitting/lateral leans ?  ?  ?  ?  ?  ?  ?  ?  ?   ? ? ? ?Vision Patient Visual Report: No change from baseline ?   ?   ?   ?   ? ?Pertinent Vitals/Pain Pain Assessment ?Pain Assessment: No/denies pain  ? ? ? ?Hand Dominance Right ?  ?Extremity/Trunk Assessment Upper Extremity Assessment ?Upper Extremity Assessment: Generalized weakness ?  ?Lower Extremity Assessment ?Lower Extremity Assessment: Generalized weakness ?  ?  ?  ?Communication Communication ?Communication: Prefers  language other than [place];Other (comment);Interpreter utilized Barista used) ?  ?Cognition Arousal/Alertness: Awake/alert ?Behavior During Therapy: [facility] for tasks assessed/performed ?Overall Cognitive Status: No family/caregiver present to determine baseline cognitive functioning ?  ?  ?  ?  ?  ?  ?  ?  ?  ?  ?  ?  ?  ?   ?  ?  ?General Comments: Pt is oriented to self, month, and location. Interpreter says, " I am just going to interpret exactly what he says but it does not make any sense". ?  ?  ?   ?   ?   ? ? ?Home Living Family/patient expects to be discharged to:: Private residence ?Living Arrangements: Spouse/significant other;Children ?Available Help at Discharge: Available 24 hours/day;Family;Friend(s) ?Type of Home: Mobile home ?Home Access: Stairs to enter ?Entrance Stairs-Number of Steps: 4 ?Entrance Stairs-Rails: Can reach both;Right;Left ?Home Layout: One level ?  ?  ?Bathroom Shower/Tub: Tub/shower unit ?  ?Bathroom Toilet: Standard ?  ?  ?Home Equipment: Agricultural consultant (2 wheels) ?  ?  ?  ? ?  ?Prior Functioning/Environment Prior Level of Function : Independent/Modified Independent ?  ?  ?  ?  ?  ?  ?Mobility Comments: Pt reports independent without use of AD ?ADLs Comments: Pt reports being independent with ADL management at home. ?  ? ?  ?  ?OT Problem List: Decreased strength;Decreased activity tolerance;Decreased safety awareness;Impaired balance (sitting and/or standing);Decreased knowledge of use of DME or AE ?  ?   ?OT Treatment/Interventions: Self-care/ADL training;Therapeutic exercise;Therapeutic activities;Energy conservation;DME and/or AE instruction;Patient/family education;Manual therapy;Balance training  ?  ?OT Goals(Current goals can be found in the care plan section) Acute Rehab OT Goals ?Patient Stated Goal: to go home ?OT Goal Formulation: With patient ?Time For Goal Achievement: 04/11/22 ?Potential to Achieve Goals: Good ?ADL Goals ?Pt Will Perform Grooming: standing;with supervision ?Pt Will Perform Lower Body Dressing: with min guard assist;sit to/from stand ?Pt Will Transfer to Toilet: with min guard assist;ambulating ?Pt Will Perform Toileting - Clothing Manipulation and hygiene: with min guard assist;sit to/from stand  ?OT Frequency: Min 2X/week ?  ? ?   ?AM-PAC OT "6 Clicks" Daily Activity      ?Outcome Measure Help from another person eating meals?: None ?Help from another person taking care of personal grooming?: A Little ?Help from another person toileting, which includes using toliet, bedpan, or urinal?: A Lot ?Help from another person bathing (including washing, rinsing, drying)?: A Lot ?Help from another person to put on and taking off regular upper body clothing?: A Little ?Help from another person to put on and taking off regular lower body clothing?: A Lot ?6 Click Score: 16 ?  ?End of Session Nurse Communication: Mobility status ? ?Activity Tolerance: Patient tolerated treatment well ?Patient left: in bed;with call bell/phone within reach ? ?OT Visit Diagnosis: Unsteadiness on feet (R26.81);Repeated falls (R29.6);Muscle weakness (generalized) (M62.81)  ?              ?Time: 2542-7062 ?OT Time Calculation (min): 24 min ?Charges:  OT General Charges ?$OT Visit: 1 Visit ?OT Evaluation ?$OT Eval Moderate Complexity: 1 Mod ?OT Treatments ?$Therapeutic Activity: 8-22 mins ? ?Jackquline Denmark, MS, OTR/L , CBIS ?ascom [phone]  ?03/28/22, 3:43 PM  ?

## 2022-03-28 NOTE — Progress Notes (Signed)
PHARMACY CONSULT NOTE ? ?Pharmacy Consult for Electrolyte Monitoring and Replacement  ? ?Recent Labs: ?Potassium (mmol/L)  ?Date Value  ?03/28/2022 3.2 (L)  ? ?Magnesium (mg/dL)  ?Date Value  ?03/28/2022 2.2  ? ?Calcium (mg/dL)  ?Date Value  ?03/28/2022 7.9 (L)  ? ?Albumin (g/dL)  ?Date Value  ?03/28/2022 2.1 (L)  ? ?Phosphorus (mg/dL)  ?Date Value  ?03/28/2022 3.3  ? ?Sodium (mmol/L)  ?Date Value  ?03/28/2022 140  ? ?Assessment: ?56 year old male admitted with hepatic encephalopathy. Reportedly nonadherent to PTA lactulose. PMH includes cirrhosis, diabetes mellitus. Pharmacy consulted to assist with electrolyte monitoring and replacement as indicated. ? ?Intubated 5/4 - 5/7 ? ?MIVF: Isotonic bicarbonate gtt at 75 cc/hr ?Cathartics: Lactulose 30g TID ? ?Goal of Therapy:  ?Electrolytes within normal limits ? ?Plan:  ?--K 3.2, Kcl 40 mEq given this AM with additional Kcl 20 mEq scheduled for this afternoon per PCCM ?--Patient care transferred from PCCM to [facility]. Will discontinue electrolyte consult at this time. Defer further ordering of labs and electrolyte replacement to primary team ?--Pharmacy will continue to monitor peripherally ? ?Benita Gutter ?03/28/2022 ?7:50 AM ? ?

## 2022-03-28 NOTE — Plan of Care (Signed)
?  Problem: Health Behavior/Discharge Planning: ?Goal: Ability to manage health-related needs will improve ?Outcome: Progressing ?  ?Problem: Clinical Measurements: ?Goal: Ability to maintain clinical measurements within normal limits will improve ?Outcome: Progressing ?Goal: Will remain free from infection ?Outcome: Progressing ?Goal: Diagnostic test results will improve ?Outcome: Progressing ?Goal: Respiratory complications will improve ?Outcome: Progressing ?Goal: Cardiovascular complication will be avoided ?Outcome: Progressing ?  ?Problem: Activity: ?Goal: Ability to tolerate increased activity will improve ?Outcome: Progressing ?  ?Problem: Respiratory: ?Goal: Ability to maintain a clear airway and adequate ventilation will improve ?Outcome: Progressing ?  ?Problem: Role Relationship: ?Goal: Method of communication will improve ?Outcome: Progressing ?  ?

## 2022-03-28 NOTE — Progress Notes (Signed)
Foley removed per MD order, pt tolerated well.

## 2022-03-28 NOTE — Progress Notes (Addendum)
?PROGRESS NOTE ? ? ? ?Shawn Torres  [id number] DOB: [birth year] DOA: 03/24/2022 ?PCP: Caryl Never, MD  ? ?Assessment & Plan: ?  ?Principal Problem: ?  Acute hepatic encephalopathy ([place]) ? ?Assessment and Plan: ? ?Acute hypoxic & hypercapnic respiratory failure: s/p intubation, ventilation & extubation. Continue on supplemental oxygen and wean as tolerated ? ?Acute hepatic encephalopathy: secondary to alcoholic cirrhosis. Continue on lactulose  ? ?Alcohol cirrhosis: w/ ascites. Continue on rifaximin. Ascitic fluid not consistent w/ SBP so IV rocephin was d/c. Has not drink alcohol in >1 year  ? 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 Likely secondary to cirrhosis  ? ?ACD: likely secondary to cirrhosis. No need for a transfusion currently ? ?Thrombocytopenia: likely secondary to cirrhosis ? ? ? ? ? ?DVT prophylaxis: lovenox  ?Code Status: full  ?Family Communication: discussed pt's care w pt's daughter at bedside and answered her questions  ?Disposition Plan: depends on PT/OT recs  ? ?Level of care: Stepdown ? ?Status is: Inpatient ?Remains inpatient appropriate because: severity of illness ? ? ?Consultants:  ?ICU ? ?Procedures: ? ?Antimicrobials:  ? ? ?Subjective: ?Pt c/o fatigue  ? ?Objective: ?Vitals:  ? 03/28/22 0300 03/28/22 0400 03/28/22 0500 03/28/22 0600  ?BP: 109/74  (!) 101/57 104/61  ?Pulse: 79 74 79 78  ?Resp: 15 15 17 15   ?Temp:  98.9 ?F (37.2 ?C)    ?TempSrc:  Oral    ?SpO2:      ?Weight:   83.4 kg   ?Height:      ? ? ?Intake/Output Summary (Last 24 hours) at 03/28/2022 0756 ?Last data filed at 03/28/2022 0600 ?Gross per 24 hour  ?Intake 2034.81 ml  ?Output 1420 ml  ?Net 614.81 ml  ? ?Filed Weights  ? 03/26/22 0331 03/27/22 0352 03/28/22 0500  ?Weight: 78.9 kg 80.9 kg 83.4 kg  ? ? ?Examination: ? ?General exam: Appears comfortable   ?Respiratory system: Clear to auscultation. Respiratory effort normal. ?Cardiovascular system: S1 & S2+. No  rubs, gallops or clicks.   ?Gastrointestinal system: Abdomen is nondistended, soft and nontender. Normal bowel sounds heard. ?Central nervous system: Alert and oriented. Moves all extremities  ?Psychiatry: Judgement and insight appear at baseline.  Flat mood and affect  ? ? ? ?Data Reviewed: I have personally reviewed following labs and imaging studies ? ?CBC: ?Recent Labs  ?Lab 03/24/22 ?[id number] 03/25/22 ?[id number] 03/26/22 ?[id number] 03/27/22 ?[id number] 03/28/22 ?0335  ?WBC 5.5 6.8 7.0 6.8 6.1  ?HGB 9.7* 8.2* 8.5* 8.8* 8.3*  ?HCT 27.5* 24.3* 25.4* 26.0* 24.1*  ?MCV 90.8 93.8 95.1 95.2 97.6  ?PLT 122* 96* 99* 90* 81*  ? ?Basic Metabolic Panel: ?Recent Labs  ?Lab 03/24/22 ?[id number] 03/24/22 ?[id number] 03/25/22 ?[id number] 03/26/22 ?[id number] 03/27/22 ?[id number] 03/28/22 ?0335  ?NA 138  --  140 143 140 140  ?K 4.1  --  3.6 3.6 3.4* 3.2*  ?CL 112*  --  108 109 102 103  ?CO2 18*  --  21* 26 30 30   ?GLUCOSE 274*  --  107* 185* 184* 109*  ?BUN 26*  --  29* 30* 31* 29*  ?CREATININE 1.53*  --  1.68* 1.46* 1.30* 1.30*  ?CALCIUM 8.2*  --  7.8* 7.8* 7.7* 7.9*  ?MG  --  1.8 1.7 2.2 2.4 2.2  ?PHOS  --   --  4.6 4.5 3.9 3.3  ? ?GFR: ?Estimated Creatinine Clearance: 67.1 mL/min (A) (by C-G formula based on SCr of 1.3 mg/dL (H)). ?Liver Function Tests: ?Recent Labs  ?Lab 03/24/22 ?[id number] 03/25/22 ?  [id number] 03/26/22 ?[id number] 03/27/22 ?[id number] 03/28/22 ?0335  ?AST 30 30 32 32 36  ?ALT 17 15 15 14 14   ?ALKPHOS 182* 133* 141* 145* 126  ?BILITOT 2.3* 1.7* 1.7* 1.6* 1.9*  ?PROT 6.6 5.5* 5.6* 5.4* 5.1*  ?ALBUMIN 2.7* 2.4* 2.2* 2.3* 2.1*  ? ?No results for input(s): LIPASE, AMYLASE in the last 168 hours. ?Recent Labs  ?Lab 03/24/22 ?[id number] 03/25/22 ?0444  ?AMMONIA 338* 109*  ? ?Coagulation Profile: ?Recent Labs  ?Lab 03/24/22 ?[id number] 03/25/22 ?0444  ?INR 1.8* 2.1*  ? ?Cardiac Enzymes: ?No results for input(s): CKTOTAL, CKMB, CKMBINDEX, TROPONINI in the last 168 hours. ?BNP (last 3 results) ?No results for input(s): PROBNP in the last 8760 hours. ?HbA1C: ?No results for input(s): HGBA1C in the last 72 hours. ?CBG: ?Recent  Labs  ?Lab 03/27/22 ?1150 03/27/22 ?1626 03/27/22 ?1924 03/27/22 ?2146 03/28/22 ?0724  ?GLUCAP 168* 104* 152* 131* 106*  ? ?Lipid Profile: ?No results for input(s): CHOL, HDL, LDLCALC, TRIG, CHOLHDL, LDLDIRECT in the last 72 hours. ?Thyroid Function Tests: ?No results for input(s): TSH, T4TOTAL, FREET4, T3FREE, THYROIDAB in the last 72 hours. ?Anemia Panel: ?No results for input(s): VITAMINB12, FOLATE, FERRITIN, TIBC, IRON, RETICCTPCT in the last 72 hours. ?Sepsis Labs: ?Recent Labs  ?Lab 03/24/22 ?[id number] 03/24/22 ?[id number]  ?PROCALCITON  --  <0.10  ?LATICACIDVEN 2.5* 2.0*  ? ? ?Recent Results (from the past 240 hour(s))  ?Blood culture (routine x 2)     Status: None (Preliminary result)  ? Collection Time: 03/24/22  5:39 AM  ? Specimen: BLOOD  ?Result Value Ref Range Status  ? Specimen Description BLOOD RIGHT ANTECUBITAL  Final  ? Special Requests   Final  ?  BOTTLES DRAWN AEROBIC AND ANAEROBIC Blood Culture adequate volume  ? Culture   Final  ?  NO GROWTH 4 DAYS ?Performed at [facility], [address] [place], [place] [address] ?  ? Report Status PENDING  Incomplete  ?Blood culture (routine x 2)     Status: None (Preliminary result)  ? Collection Time: 03/24/22  5:39 AM  ? Specimen: BLOOD  ?Result Value Ref Range Status  ? Specimen Description BLOOD BLOOD LEFT HAND  Final  ? Special Requests   Final  ?  BOTTLES DRAWN AEROBIC AND ANAEROBIC Blood Culture results may not be optimal due to an inadequate volume of blood received in culture bottles  ? Culture   Final  ?  NO GROWTH 4 DAYS ?Performed at [facility], [address] [place], [place] [address] ?  ? Report Status PENDING  Incomplete  ?Body fluid culture w Gram Stain     Status: None (Preliminary result)  ? Collection Time: 03/24/22  4:01 PM  ? Specimen: PATH Cytology Peritoneal fluid  ?Result Value Ref Range Status  ? Specimen Description   Final  ?  PERITONEAL ?Performed at [facility], [address] [place], [place]  [address] ?  ? Special Requests   Final  ?  NONE ?Performed at [facility], [address], [place], [place] [address] ?  ? Gram Stain NO WBC SEEN ?NO ORGANISMS SEEN ?  Final  ? Culture   Final  ?  NO GROWTH 3 DAYS ?Performed at [facility], [place] [address], [place], [place] [address] ?  ? Report Status PENDING  Incomplete  ?MRSA Next Gen by PCR, Nasal     Status: None  ? Collection Time: 03/24/22  8:16 PM  ? Specimen: Nasal Mucosa; Nasal Swab  ?  Result Value Ref Range Status  ? MRSA by PCR Next Gen NOT DETECTED NOT DETECTED Final  ?  Comment: (NOTE) ?The GeneXpert MRSA Assay (FDA approved for NASAL specimens only), ?is one component of a comprehensive MRSA colonization surveillance ?program. It is not intended to diagnose MRSA infection nor to guide ?or monitor treatment for MRSA infections. ?Test performance is not FDA approved in patients less than 2 years ?old. ?Performed at [facility], [place], ?[place] [address] ?  ?Resp Panel by RT-PCR (Flu A&B, Covid) Nasopharyngeal Swab     Status: None  ? Collection Time: 03/24/22  8:36 PM  ? Specimen: Nasopharyngeal Swab; Nasopharyngeal(NP) swabs in vial transport medium  ?Result Value Ref Range Status  ? SARS Coronavirus 2 by RT PCR NEGATIVE NEGATIVE Final  ?  Comment: (NOTE) ?SARS-CoV-2 target nucleic acids are NOT DETECTED. ? ?The SARS-CoV-2 RNA is generally detectable in upper respiratory ?specimens during the acute phase of infection. The lowest ?concentration of SARS-CoV-2 viral copies this assay can detect is ?138 copies/mL. A negative result does not preclude SARS-Cov-2 ?infection and should not be used as the sole basis for treatment or ?other patient management decisions. A negative result may occur with  ?improper specimen collection/handling, submission of specimen other ?than nasopharyngeal swab, presence of viral mutation(s) within the ?areas targeted by this assay, and inadequate number of viral ?copies(<138  copies/mL). A negative result must be combined with ?clinical observations, patient history, and epidemiological ?information. The expected result is Negative. ? ?Fact Sheet for Patients:  ?https://.info/

## 2022-03-28 NOTE — Evaluation (Signed)
Physical Therapy Evaluation ?Patient Details ?Name: Shawn Torres ?MRN: [id number] ?DOB: 10-Feb-1966 ?Today's Date: 03/28/2022 ? ?History of Present Illness ? Gyan Cambre is a 56yoM who comes to [facility] on 5/4 c AMS afte rrunning out of lactulose at home. Pt required intubation upon arrival. Pt extubated to 3L on 5/7. PMH: ETOH abuse, liver cirrhosis c weekly paracentesis, DM2, hepatic encephalopathy.  ?Clinical Impression ? Pt admitted with above diagnosis. Pt currently with functional limitations due to the deficits listed below (see "PT Problem List"). Upon entry, pt in bed, awake and agreeable to participate. DTR provides background and some Spanish/English interpretation until staff interp arrives. Pt remarkably weak, requires moderate physical assistance both for bed mobility to/from EOB, as well as \\attempting  standing. Pt too weak and unsteady to attempt and AMB at this time but he really wants to try tomorrow. Patient's performance this date reveals decreased ability, independence, and tolerance in performing all basic mobility required for performance of activities of daily living. Pt requires additional DME, close physical assistance, and cues for safe participate in mobility. Pt will benefit from skilled PT intervention to increase independence and safety with basic mobility in preparation for discharge to the venue listed below.   ? ? ?No data found. ?   ?   ? ?Recommendations for follow up therapy are one component of a multi-disciplinary discharge planning process, led by the attending physician.  Recommendations may be updated based on patient status, additional functional criteria and insurance authorization. ? ?Follow Up Recommendations Home health PT ? ?  ?Assistance Recommended at Discharge Intermittent Supervision/Assistance  ?Patient can return home with the following ? Two people to help with walking and/or transfers;A lot of help with bathing/dressing/bathroom ? ?  ?Equipment Recommendations  Wheelchair (measurements PT);Wheelchair cushion (measurements PT)  ?Recommendations for Other Services ?    ?  ?Functional Status Assessment Patient has had a recent decline in their functional status and demonstrates the ability to make significant improvements in function in a reasonable and predictable amount of time.  ? ?  ?Precautions / Restrictions Precautions ?Precautions: Fall ?Restrictions ?Weight Bearing Restrictions: No  ? ?  ? ?Mobility ? Bed Mobility ?Overal bed mobility: Needs Assistance ?Bed Mobility: Supine to Sit, Sit to Supine ?  ?  ?Supine to sit: Mod assist ?Sit to supine: Mod assist ?  ?  ?  ? ?Transfers ?Overall transfer level: Needs assistance ?Equipment used: Rolling walker (2 wheels), 1 person hand held assist ?Transfers: Sit to/from Stand ?Sit to Stand: Min assist ?  ?  ?  ?  ?  ?General transfer comment: globally weak, low confidence, concerns of falling ?  ? ?Ambulation/Gait ?Ambulation/Gait assistance:  (struggles to follow simple commands for side stepping at EOB) ?  ?  ?  ?  ?  ?  ?  ? ?Stairs ?  ?  ?  ?  ?  ? ?Wheelchair Mobility ?  ? ?Modified Rankin (Stroke Patients Only) ?  ? ?  ? ?Balance   ?  ?  ?  ?  ?  ?  ?  ?  ?  ?  ?  ?  ?  ?  ?  ?  ?  ?  ?   ? ? ? ?Pertinent Vitals/Pain Pain Assessment ?Pain Assessment: No/denies pain  ? ? ?Home Living Family/patient expects to be discharged to:: Private residence ?Living Arrangements: Spouse/significant other;Children ?Available Help at Discharge: Available 24 hours/day;Family;Friend(s) ?Type of Home: Mobile home ?Home Access: Stairs to enter ?Entrance Stairs-Rails: Can  reach both;Right;Left ?Entrance Stairs-Number of Steps: 4 ?  ?Home Layout: One level ?Home Equipment: Agricultural consultant (2 wheels) ?   ?  ?Prior Function   ?  ?  ?  ?  ?  ?  ?  ?  ?  ? ? ?Hand Dominance  ?   ? ?  ?Extremity/Trunk Assessment  ?   ?  ? ?  ?  ? ?   ?Communication  ?    ?Cognition Arousal/Alertness: Lethargic ?Behavior During Therapy: [facility] for tasks  assessed/performed ?  ?  ?  ?  ?  ?  ?  ?  ?  ?  ?  ?  ?  ?  ?  ?  ?  ?  ?  ?  ? ?  ?General Comments   ? ?  ?Exercises    ? ?Assessment/Plan  ?  ?PT Assessment Patient needs continued PT services  ?PT Problem List Decreased strength;Decreased activity tolerance;Decreased mobility;Decreased balance ? ?   ?  ?PT Treatment Interventions DME instruction;Balance training;Gait training;Stair training;Functional mobility training;Therapeutic activities;Therapeutic exercise;Patient/family education   ? ?PT Goals (Current goals can be found in the Care Plan section)  ?Acute Rehab PT Goals ?Patient Stated Goal: DTR would like pt to return to baseline independence ?PT Goal Formulation: With family ?Time For Goal Achievement: 04/11/22 ?Potential to Achieve Goals: Good ? ?  ?Frequency Min 2X/week ?  ? ? ?Co-evaluation   ?  ?  ?  ?  ? ? ?  ?AM-PAC PT "6 Clicks" Mobility  ?Outcome Measure Help needed turning from your back to your side while in a flat bed without using bedrails?: A Lot ?Help needed moving from lying on your back to sitting on the side of a flat bed without using bedrails?: A Lot ?Help needed moving to and from a bed to a chair (including a wheelchair)?: A Lot ?Help needed standing up from a chair using your arms (e.g., wheelchair or bedside chair)?: A Lot ?Help needed to walk in hospital room?: Total ?Help needed climbing 3-5 steps with a railing? : Total ?6 Click Score: 10 ? ?  ?End of Session   ?Activity Tolerance: Patient tolerated treatment well;No increased pain;Patient limited by fatigue;Patient limited by lethargy ?Patient left: in bed;with family/visitor present;with call bell/phone within reach ?Nurse Communication: Mobility status ?PT Visit Diagnosis: Unsteadiness on feet (R26.81);Difficulty in walking, not elsewhere classified (R26.2);Muscle weakness (generalized) (M62.81) ?  ? ?Time: 1017-5102 ?PT Time Calculation (min) (ACUTE ONLY): 17 min ? ? ?Charges:   PT Evaluation ?$PT Eval Moderate Complexity:  1 Mod ?  ?  ?   ?12:10 PM, 03/28/22 ?Rosamaria Lints, PT, DPT ?Physical Therapist - [place] ?[facility]  ?[phone] (ASCOM)  ? ? ?Adriane Guglielmo C ?03/28/2022, 12:07 PM ? ?

## 2022-03-28 NOTE — Progress Notes (Signed)
Nutrition Follow Up Note  ? ?DOCUMENTATION CODES:  ? ?Non-severe (moderate) malnutrition in context of chronic illness ? ?INTERVENTION:  ? ?Ensure Enlive po TID, each supplement provides 350 kcal and 20 grams of protein. ? ?MVI po daily  ? ?NUTRITION DIAGNOSIS:  ? ?Moderate Malnutrition related to chronic illness (cirrhosis, etoh abuse) as evidenced by moderate fat depletion, moderate muscle depletion, severe muscle depletion. ?-ongoing ? ?GOAL:  ? ?Patient will meet greater than or equal to 90% of their needs ?-progressing  ? ?MONITOR:  ? ?PO intake, Supplement acceptance, Labs, Weight trends, Skin, I & O's ? ?ASSESSMENT:  ? ?56 y.o. male with h/o DM, COVID 1 (2020), urinary retention, alcoholic decompensated cirrhosis, ascites and esophageal variceal bleeding s/p ligation in 09/2021 who is now admitted with hepatic encephalopathy and AKI. Pt required intubation and ventilation for airway protection. ? ?Pt s/p paracentesis 5/4 with 4.6L output  ? ?Pt extubated 5/7. Pt initiated on a regular diet yesterday evening. Pt documented to have eaten 80% of his dinner last night. Pt sitting up in room and working with therapy today. RD will add supplements and MVI to help pt meet his estimated needs. Pt likes Ensure. RD will also add salt and fluid restrictions to pt's diet. Per chart, pt is down 37lbs since admission and appears to still be up ~7lbs from his UBW.  ? ?Medications reviewed and include: lovenox, folic acid, insulin, lactulose, MVI, KCl,  xifaxin, thiamine  ? ?Labs reviewed: K 3.2(L), BUN 29(H), creat 1.30(H), P 3.3 wnl, Mg 2.2 wnl ?Ammonia- 109(H)- 5/5 ?Hgb 8.3(L), Hct 24.1(L) ?Cbgs- 202, 106 x 24hrs ? ?Diet Order:   ? ?Diet Order   ? ?       ?  Diet regular Room service appropriate? Yes; Fluid consistency: Thin; Fluid restriction: 2000 mL Fluid  Diet effective now       ?  ? ?  ?  ? ?  ? ?EDUCATION NEEDS:  ? ?No education needs have been identified at this time ? ?Skin:  Skin Assessment: Reviewed RN  Assessment ? ?Last BM:  5/7 ? ?Height:  ? ?Ht Readings from Last 1 Encounters:  ?03/24/22 5\' 7"  (1.702 m)  ? ? ?Weight:  ? ?Wt Readings from Last 1 Encounters:  ?03/28/22 83.4 kg  ? ? ?Ideal Body Weight:  67.2 kg ? ?BMI:  Body mass index is 28.8 kg/m?. ? ?Estimated Nutritional Needs:  ? ?Kcal:  2000-2300kcal/day ? ?Protein:  100-115g/day ? ?Fluid:  1.7-2.0L/day ? ?05/28/22 MS, RD, LDN ?Please refer to AMION for RD and/or RD on-call/weekend/after hours pager ? ?

## 2022-03-29 ENCOUNTER — Other Ambulatory Visit: Payer: Self-pay

## 2022-03-29 LAB — CBC
HCT: 25.5 % — ABNORMAL LOW (ref 39.0–52.0)
Hemoglobin: 8.6 g/dL — ABNORMAL LOW (ref 13.0–17.0)
MCH: 32.1 pg (ref 26.0–34.0)
MCHC: 33.7 g/dL (ref 30.0–36.0)
MCV: 95.1 fL (ref 80.0–100.0)
Platelets: 98 10*3/uL — ABNORMAL LOW (ref 150–400)
RBC: 2.68 MIL/uL — ABNORMAL LOW (ref 4.22–5.81)
RDW: 14.5 % (ref 11.5–15.5)
WBC: 6.6 10*3/uL (ref 4.0–10.5)
nRBC: 0 % (ref 0.0–0.2)

## 2022-03-29 LAB — GLUCOSE, CAPILLARY
Glucose-Capillary: 112 mg/dL — ABNORMAL HIGH (ref 70–99)
Glucose-Capillary: 173 mg/dL — ABNORMAL HIGH (ref 70–99)

## 2022-03-29 LAB — COMPREHENSIVE METABOLIC PANEL
ALT: 16 U/L (ref 0–44)
AST: 37 U/L (ref 15–41)
Albumin: 2.2 g/dL — ABNORMAL LOW (ref 3.5–5.0)
Alkaline Phosphatase: 140 U/L — ABNORMAL HIGH (ref 38–126)
Anion gap: 7 (ref 5–15)
BUN: 31 mg/dL — ABNORMAL HIGH (ref 6–20)
CO2: 28 mmol/L (ref 22–32)
Calcium: 8.4 mg/dL — ABNORMAL LOW (ref 8.9–10.3)
Chloride: 103 mmol/L (ref 98–111)
Creatinine, Ser: 1.28 mg/dL — ABNORMAL HIGH (ref 0.61–1.24)
GFR, Estimated: >60 mL/min (ref >60)
Glucose, Bld: 190 mg/dL — ABNORMAL HIGH (ref 70–99)
Potassium: 3.6 mmol/L (ref 3.5–5.1)
Sodium: 138 mmol/L (ref 135–145)
Total Bilirubin: 1.8 mg/dL — ABNORMAL HIGH (ref 0.3–1.2)
Total Protein: 5.6 g/dL — ABNORMAL LOW (ref 6.5–8.1)

## 2022-03-29 LAB — CULTURE, BLOOD (ROUTINE X 2)
Culture: NO GROWTH
Culture: NO GROWTH
Special Requests: ADEQUATE

## 2022-03-29 MED ORDER — LACTULOSE 10 GM/15ML PO SOLN
20.0000 g | Freq: Three times a day (TID) | ORAL | 0 refills | Status: DC
  Filled 2022-03-29: qty 1892, 21d supply, fill #0

## 2022-03-29 MED ORDER — RIFAXIMIN 550 MG PO TABS: 550.0000 mg | ORAL_TABLET | Freq: Two times a day (BID) | ORAL | 0 refills | Status: AC

## 2022-03-29 MED ORDER — FUROSEMIDE 40 MG PO TABS
40.0000 mg | ORAL_TABLET | Freq: Every day | ORAL | 0 refills | Status: DC
Start: 2022-03-29 — End: 2022-07-27
  Filled 2022-03-29: qty 30, 30d supply, fill #0

## 2022-03-29 NOTE — Discharge Summary (Signed)
Physician Discharge Summary  ?Shawn Torres [id number] DOB: 04-20-1966 DOA: 03/24/2022 ? ?PCP: Caryl Never, MD ? ?Admit date: 03/24/2022 ?Discharge date: 03/29/2022 ? ?Admitted From: home  ?Disposition:  home w/ home health  ? ?Recommendations for Outpatient Follow-up:  ?Follow up with PCP in 1-2 weeks ?F/u w/ GI at [facility] in 1 week  ? ?Home Health: yes ?Equipment/Devices: ? ?Discharge Condition: stable  ?CODE STATUS: full  ?Diet recommendation: Heart Healthy ? ?Brief/Interim Summary: ?HPI was taken from NP Dewaine Conger: ?Shawn Torres is a 56 year old male with a past medical history significant for alcohol induced cirrhosis, recurrent hepatic encephalopathy, diabetes mellitus who presented to [facility] ED on 03/24/2022 due to altered mental status.  Patient remains altered, intubated and sedated with no family available, therefore history is obtained from ED and nursing notes. ?  ?Per chart review his daughter reports the patient ran out of his lactulose yesterday, and was unable to get it refilled as he was not able to get in touch with his physician in time.  Normally he takes it every 3-4 hours.  His last known well time was 9:30 PM last night before going to bed when he was acting normally.  She checked on him in the middle the night and found him altered thus contacted EMS.  Daughter reports he is not drinking alcohol in over a year.  She denies any known history of fever, cough, abdominal pain, diarrhea. ?  ?ED Course: ?Initial Vital Signs: Temperature 96.6 ?F, pulse 75, respiratory rate 20, blood pressure 151/94, SPO2 100% ?Significant Labs: Bicarbonate 18, glucose 274, BUN 26, creatinine 1.53, alkaline phosphatase 182, AST 30, ALT 17, ammonia 338, total bilirubin 2.3, high-sensitivity troponin 6, lactic acid 2.5, procalcitonin less than 0.1, WBC 5.5, hemoglobin 9.7, hematocrit 27.5, platelets 122, INR 1.8, PT 20.6, ethyl alcohol less than 10 ?ABG: pH 7.52/PCO2 23/PO2 117/bicarb 18.8 ?Urinalysis is negative for  UTI ?Urine drug screen is negative ?Imaging ?Chest X-ray>>IMPRESSION: ?Stable low volume chest with cardiomegaly.  No acute finding. ?CT head>>IMPRESSION: ?No acute intracranial CT findings or interval changes. There is mild ?atrophy and small-vessel disease, but more than typically seen at ?this age. No new abnormality. ?Medications Administered: 2 L normal saline boluses, IV cefepime, Flagyl, vancomycin, rectal lactulose ?  ?On reevaluation by ED provider patient was noted to be clenching his jaw with blood in his mouth from biting his tongue.  No seizure-like activity noted.  Given concern for ability to protect his airway decision was made to intubate. ?  ?PCCM asked to admit for further work-up and treatment of acute hepatic encephalopathy. ? ?As per Dr. Mortimer Fries: ?5/4: Required intubation in the ED for airway protection due to hepatic encephalopathy.  PCCM asked to admit.  IR consulted for paracentesis ?5/5 remains intubated, severe end stage liver disease ?5/6 failed SAT, MRO BRAIN NEG ? ?As per Dr. Jimmye Norman 5/8-03/29/22: Pt was extubated and off of supplemental oxygen by the time I started taking care of the pt. Pt's mental status was back to baseline prior to d/c. PT/OT evaluated pt and recs HH. CM was able to set up [facility] through charity. Rx were sent to med management. For more information, please see previous progress/consult notes.  ? ? ?Discharge Diagnoses:  ?Principal Problem: ?  Acute hepatic encephalopathy ([place]) ? ?Acute hypoxic & hypercapnic respiratory failure: s/p intubation, ventilation & extubation.Weaned off of supplemental oxygen. Resolved  ? ?Acute hepatic encephalopathy: secondary to alcoholic cirrhosis. Continue on lactulose. Mental status is back to baseline  ? ?Alcohol  cirrhosis: w/ ascites. Continue on rifaximin. Ascitic fluid not consistent w/ SBP so IV rocephin was d/c. Has not drink alcohol in >1 year  ? 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 Likely secondary to  cirrhosis  ? ?ACD: likely secondary to cirrhosis. No need for a transfusion currently ? ?Thrombocytopenia: likely secondary to cirrhosis ? ?Discharge Instructions ? ?Discharge Instructions   ? ? Diet - low sodium heart healthy   Complete by: As directed ?  ? Discharge instructions   Complete by: As directed ?  ? F/u w/ PCP in 1 week. F/u w/ GI at [facility] in 1 week.  ? Increase activity slowly   Complete by: As directed ?  ? ?  ? ?Allergies as of 03/29/2022   ? ?   Reactions  ? Albumin Gc Rash  ? Patient gets rash with albumin. See media tab picture dated 12/24/21. Needs pre-meds w/ albumin (benadryl) to avoid allergic reaction.   ? ?  ? ?  ?Medication List  ?  ? ?STOP taking these medications   ? ?nadolol 20 MG tablet ?Commonly known as: CORGARD ?  ? ?  ? ?TAKE these medications   ? ?carvedilol 6.25 MG tablet ?Commonly known as: COREG ?Take 6.25 mg by mouth 2 (two) times daily. ?  ?feeding supplement Liqd ?Take 237 mLs by mouth 3 (three) times daily between meals. ?  ?folic acid 1 MG tablet ?Commonly known as: FOLVITE ?Take 1 tablet by mouth daily. ?  ?furosemide 40 MG tablet ?Commonly known as: LASIX ?Take 1 tablet (40 mg total) by mouth daily. ?  ?lactulose 10 GM/15ML solution ?Commonly known as: [place] ?Take 30 mLs (20 g total) by mouth 3 (three) times daily. ?  ?pantoprazole 40 MG tablet ?Commonly known as: PROTONIX ?Take 1 tablet (40 mg total) by mouth 2 (two) times daily. ?  ?rifaximin 550 MG Tabs tablet ?Commonly known as: XIFAXAN ?Take 1 tablet (550 mg total) by mouth 2 (two) times daily. ?  ?spironolactone 100 MG tablet ?Commonly known as: ALDACTONE ?Take 1 tablet (100 mg total) by mouth daily. ?  ?tamsulosin 0.4 MG Caps capsule ?Commonly known as: FLOMAX ?Take 1 capsule (0.4 mg total) by mouth daily. ?  ? ?  ? ?  ?  ? ? ?  ?Durable Medical Equipment  ?(From admission, onward)  ?  ? ? ?  ? ?  Start     Ordered  ? 03/29/22 1121  For home use only DME standard manual wheelchair with seat cushion  Once        ?Comments: Patient suffers from cirrhosis, generalized weakness which impairs their ability to perform daily activities like bathing, dressing, feeding, grooming, and toileting in the home.  A cane, crutch, or walker will not resolve issue with performing activities of daily living. A wheelchair will allow patient to safely perform daily activities. Patient can safely propel the wheelchair in the home or has a caregiver who can provide assistance. Length of need Lifetime. ?Accessories: elevating leg rests (ELRs), wheel locks, extensions and anti-tippers.  ? 03/29/22 1121  ? ?  ?  ? ?  ? ? ?Allergies  ?Allergen Reactions  ? Albumin Gc Rash  ?  Patient gets rash with albumin. See media tab picture dated 12/24/21. Needs pre-meds w/ albumin (benadryl) to avoid allergic reaction.   ? ? ?Consultations: ?ICU ? ? ?Procedures/Studies: ?DG Abd 1 View ? ?Result Date: 03/27/2022 ?CLINICAL DATA:  Vomiting.  NGT. EXAM: ABDOMEN - 1 VIEW COMPARISON:  Similar study 02/13/2022. FINDINGS: NGT is in place with the tip in the proximal gastric antrum. The intestinal gas pattern is nonobstructive. No pathologic urinary calculi are identified. There are vascular calcifications in the pelvis. There is no supine evidence of free air. Visceral shadows are stable. There is degenerative change of the spine. There are atelectatic bands in the medial lung bases but no confluent consolidation. IMPRESSION: No acute radiographic findings. NGT tip left of the midline most likely in the proximal gastric antrum. Electronically Signed   By: Telford Nab M.D.   On: 03/27/2022 05:42  ? ?CT HEAD WO CONTRAST (5MM) ? ?Result Date: 03/24/2022 ?CLINICAL DATA:  Mental status changes, unknown cause. EXAM: CT HEAD WITHOUT CONTRAST TECHNIQUE: Contiguous axial images were obtained from the base of the skull through the vertex without intravenous contrast. RADIATION DOSE REDUCTION: This exam was performed according to the departmental dose-optimization program which  includes automated exposure control, adjustment of the mA and/or kV according to patient size and/or use of iterative reconstruction technique. COMPARISON:  Recent CT 02/13/2022. FINDINGS: Brain: There is mild cerebral

## 2022-03-29 NOTE — TOC Progression Note (Signed)
Transition of Care (TOC) - Progression Note  ? ? ?Patient Details  ?Name: Shawn Torres ?MRN: [id number] ?Date of Birth: September 28, 1966 ? ?Transition of Care (TOC) CM/SW Contact  ?Gildardo Griffes, LCSW ?Phone Number: ?03/29/2022, 12:47 PM ? ?Clinical Narrative:    ? ?Referral sent to [facility] PT they will follow up. Per PT no DME needs patient walking well.  ? ?Expected Discharge Plan: Home/Self Care ?Barriers to Discharge: Continued Medical Work up ? ?Expected Discharge Plan and Services ?Expected Discharge Plan: Home/Self Care ?  ?  ?  ?Living arrangements for the past 2 months: Mobile Home ?                ?DME Arranged: N/A ?DME Agency: NA ?  ?  ?  ?  ?  ?  ?  ?  ? ? ?Social Determinants of Health (SDOH) Interventions ?  ? ?Readmission Risk Interventions ? ?  12/13/2021  ?  4:10 PM 10/12/2021  ? 12:41 PM  ?Readmission Risk Prevention Plan  ?Transportation Screening Complete Complete  ?PCP or Specialist Appt within 3-5 Days  Complete  ?HRI or Home Care Consult  Not Complete  ?HRI or Home Care Consult comments  unsure of discharge needs  ?Social Work Consult for Recovery Care Planning/Counseling  Complete  ?Palliative Care Screening  Not Applicable  ?Medication Review Oceanographer) Complete Complete  ?SW Recovery Care/Counseling Consult Complete   ?Palliative Care Screening Not Applicable   ?Skilled Nursing Facility Not Applicable   ? ? ?

## 2022-03-29 NOTE — Progress Notes (Signed)
Spanish Interpreter at bedside Pt given discharge instructions.  ?Daughter also called and given instructions.  ? ?Medication delivered to room, daughter notified.  ?

## 2022-03-29 NOTE — Progress Notes (Signed)
?  ?  Durable Medical Equipment  ?(From admission, onward)  ?  ? ? ?  ? ?  Start     Ordered  ? 03/29/22 1121  For home use only DME standard manual wheelchair with seat cushion  Once       ?Comments: Patient suffers from cirrhosis, generalized weakness which impairs their ability to perform daily activities like bathing, dressing, feeding, grooming, and toileting in the home.  A cane, crutch, or walker will not resolve issue with performing activities of daily living. A wheelchair will allow patient to safely perform daily activities. Patient can safely propel the wheelchair in the home or has a caregiver who can provide assistance. Length of need Lifetime. ?Accessories: elevating leg rests (ELRs), wheel locks, extensions and anti-tippers.  ? 03/29/22 1121  ? ?  ?  ? ?  ? ? ?[place], [place] ?[phone] ? ?

## 2022-03-29 NOTE — Plan of Care (Signed)
?  Problem: Health Behavior/Discharge Planning: ?Goal: Ability to manage health-related needs will improve ?Outcome: Progressing ?  ?Problem: Clinical Measurements: ?Goal: Ability to maintain clinical measurements within normal limits will improve ?Outcome: Progressing ?Goal: Will remain free from infection ?Outcome: Progressing ?Goal: Diagnostic test results will improve ?Outcome: Progressing ?Goal: Respiratory complications will improve ?Outcome: Progressing ?Goal: Cardiovascular complication will be avoided ?Outcome: Progressing ?  ?Problem: Activity: ?Goal: Ability to tolerate increased activity will improve ?Outcome: Not Applicable ?  ?Problem: Respiratory: ?Goal: Ability to maintain a clear airway and adequate ventilation will improve ?Outcome: Not Applicable ?  ?Problem: Role Relationship: ?Goal: Method of communication will improve ?Outcome: Not Applicable ?  ?

## 2022-04-29 ENCOUNTER — Other Ambulatory Visit: Payer: Self-pay | Admitting: Student in an Organized Health Care Education/Training Program

## 2022-05-17 ENCOUNTER — Other Ambulatory Visit: Payer: Self-pay | Admitting: Student in an Organized Health Care Education/Training Program

## 2022-05-27 ENCOUNTER — Emergency Department
Admission: EM | Admit: 2022-05-27 | Discharge: 2022-05-27 | Disposition: A | Discharge status: Home / Self Care | Payer: Self-pay | Attending: Emergency Medicine | Admitting: Emergency Medicine

## 2022-05-27 ENCOUNTER — Other Ambulatory Visit: Payer: Self-pay

## 2022-05-27 DIAGNOSIS — Z8616 Personal history of COVID-19: Secondary | ICD-10-CM | POA: Insufficient documentation

## 2022-05-27 DIAGNOSIS — E119 Type 2 diabetes mellitus without complications: Secondary | ICD-10-CM | POA: Insufficient documentation

## 2022-05-27 DIAGNOSIS — R188 Other ascites: Secondary | ICD-10-CM | POA: Insufficient documentation

## 2022-05-27 DIAGNOSIS — E871 Hypo-osmolality and hyponatremia: Secondary | ICD-10-CM | POA: Insufficient documentation

## 2022-05-27 LAB — COMPREHENSIVE METABOLIC PANEL
ALT: 18 U/L (ref 0–44)
AST: 39 U/L (ref 15–41)
Albumin: 2.5 g/dL — ABNORMAL LOW (ref 3.5–5.0)
Alkaline Phosphatase: 131 U/L — ABNORMAL HIGH (ref 38–126)
Anion gap: 9 (ref 5–15)
BUN: 14 mg/dL (ref 6–20)
CO2: 19 mmol/L — ABNORMAL LOW (ref 22–32)
Calcium: 7.5 mg/dL — ABNORMAL LOW (ref 8.9–10.3)
Chloride: 99 mmol/L (ref 98–111)
Creatinine, Ser: 0.96 mg/dL (ref 0.61–1.24)
GFR, Estimated: >60 mL/min (ref >60)
Glucose, Bld: 105 mg/dL — ABNORMAL HIGH (ref 70–99)
Potassium: 3.6 mmol/L (ref 3.5–5.1)
Sodium: 127 mmol/L — ABNORMAL LOW (ref 135–145)
Total Bilirubin: 3.1 mg/dL — ABNORMAL HIGH (ref 0.3–1.2)
Total Protein: 6.3 g/dL — ABNORMAL LOW (ref 6.5–8.1)

## 2022-05-27 LAB — URINALYSIS, ROUTINE W REFLEX MICROSCOPIC
Bilirubin Urine: NEGATIVE
Glucose, UA: NEGATIVE mg/dL
Ketones, ur: NEGATIVE mg/dL
Leukocytes,Ua: NEGATIVE
Nitrite: NEGATIVE
Protein, ur: NEGATIVE mg/dL
Specific Gravity, Urine: 1.005 (ref 1.005–1.030)
pH: 6 (ref 5.0–8.0)

## 2022-05-27 LAB — CBC
HCT: 24.5 % — ABNORMAL LOW (ref 39.0–52.0)
Hemoglobin: 9 g/dL — ABNORMAL LOW (ref 13.0–17.0)
MCH: 33.6 pg (ref 26.0–34.0)
MCHC: 36.7 g/dL — ABNORMAL HIGH (ref 30.0–36.0)
MCV: 91.4 fL (ref 80.0–100.0)
Platelets: 146 10*3/uL — ABNORMAL LOW (ref 150–400)
RBC: 2.68 MIL/uL — ABNORMAL LOW (ref 4.22–5.81)
RDW: 15 % (ref 11.5–15.5)
WBC: 6.1 10*3/uL (ref 4.0–10.5)
nRBC: 0 % (ref 0.0–0.2)

## 2022-05-27 LAB — LIPASE, BLOOD: Lipase: 33 U/L (ref 11–51)

## 2022-05-27 LAB — AMMONIA: Ammonia: 100 umol/L — ABNORMAL HIGH (ref 9–35)

## 2022-05-27 MED ORDER — LIDOCAINE-EPINEPHRINE 2 %-1:100000 IJ SOLN
30.0000 mL | Freq: Once | INTRAMUSCULAR | Status: AC
Start: 2022-05-27 — End: 2022-05-27
  Administered 2022-05-27: 30 mL
  Filled 2022-05-27: qty 2

## 2022-05-27 NOTE — Discharge Instructions (Signed)
Please follow-up closely with your outpatient provider to have your sodium rechecked in about 3 to 5 days.  Please keep your paracentesis appointment with [facility] as you will likely need to have more fluid removed.  We took about 4 L off today.

## 2022-05-27 NOTE — ED Notes (Signed)
Pt verbalized understanding of discharge instructions and follow-up care instructions. Pt advised if symptoms worsen to return to ED. Spanish interpreter was used in discharge teaching.

## 2022-05-27 NOTE — ED Notes (Signed)
Pt states his abdominal pain decreased after having fluid removed from abdomen.

## 2022-05-27 NOTE — ED Provider Notes (Signed)
[facility] Provider Note    Event Date/Time   First MD Initiated Contact with Patient 05/27/22 1724     (approximate)   History   Abd Distention   HPI  Shawn Torres is a 56 y.o. male with past medical history of alcohol induced cirrhosis, recurrent hepatic encephalopathy, diabetes who presents with abdominal distention.  Patient has history of end-stage cirrhosis and gets paracentesis every 15 days at [facility].  Last had a paracentesis 15 days ago.  His daughter schedules his appointments.  He was supposed to have a paracentesis in outpatient today but they canceled and he will go out patient next week.  He endorses increasing abdominal distention.  Denies blood in his stool or melena.  Denies shortness of breath or confusion.  His son who is with him says he is at his baseline is not confused.  Denies fevers chills.     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   Confusion    SOB (shortness of breath)    Lactic acidosis 11/06/2021   GERD (gastroesophageal reflux disease) 11/06/2021   BPH (benign prostatic hyperplasia) 11/06/2021   Stage 3a chronic kidney disease (CKD) (HCC) 11/03/2021   Macrocytic anemia 11/03/2021   Alcoholic cirrhosis of liver with ascites (HCC) 10/29/2021   Alcohol use disorder, severe, in early remission (HCC)    Malnutrition of moderate degree 10/15/2021   Abdominal distention    Acute respiratory failure with hypoxia (HCC)    Secondary esophageal varices with bleeding (HCC)    Hematemesis 10/10/2021   Hepatic encephalopathy  (HCC)    Secondary esophageal varices without bleeding (HCC)    Thrombocytopenia (HCC) 04/30/2021   Type 2 diabetes mellitus, without long-term current use of insulin (HCC) 04/30/2021   Hyponatremia 05/24/2019   Pneumonia due to COVID-19 virus 05/24/2019   Transaminitis 05/24/2019   Alcohol dependence with uncomplicated withdrawal (HCC) 05/24/2019   Hypokalemia 05/24/2019   COVID-19 05/24/2019     Physical Exam  Triage Vital Signs: ED Triage Vitals [05/27/22 1356]  Enc Vitals Group     BP (!) 135/92     Pulse Rate 96     Resp 18     Temp 99 F (37.2 C)     Temp src      SpO2 97 %     Weight      Height      Head Circumference      Peak Flow      Pain Score      Pain Loc      Pain Edu?      Excl. in GC?     Most recent vital signs: Vitals:   05/27/22 1811 05/27/22 2100  BP: (!) 136/92 122/77  Pulse: 88 85  Resp: 16 16  Temp:    SpO2: 99% 100%     General: Awake, no distress.  CV:  Good peripheral perfusion.  1+ peripheral edema Resp:  Normal effort.  Abd:  No distention.  Distended but nontender  Neuro:  Awake, Alert, Oriented x 3  Other:     ED Results / Procedures / Treatments  Labs (all labs ordered are listed, but only abnormal results are displayed) Labs Reviewed  COMPREHENSIVE METABOLIC PANEL - Abnormal; Notable for the following components:      Result Value   Sodium 127 (*)    CO2 19 (*)    Glucose, Bld 105 (*)    Calcium 7.5 (*)    Total Protein 6.3 (*)    Albumin 2.5 (*)    Alkaline Phosphatase 131 (*)    Total Bilirubin 3.1 (*)    All other components within normal limits  CBC - Abnormal; Notable for the following components:   RBC 2.68 (*)    Hemoglobin 9.0 (*)    HCT 24.5 (*)    MCHC 36.7 (*)    Platelets 146 (*)    All other components within normal limits  URINALYSIS, ROUTINE W REFLEX MICROSCOPIC - Abnormal; Notable for the following components:   Color, Urine YELLOW (*)    APPearance CLEAR (*)    Hgb urine  dipstick MODERATE (*)    Bacteria, UA RARE (*)    All other components within normal limits  AMMONIA - Abnormal; Notable for the following components:   Ammonia 100 (*)    All other components within normal limits  LIPASE, BLOOD     EKG     RADIOLOGY Bedside ultrasound of the abdomen for myself shows significant ascites   PROCEDURES:  Critical Care performed: No  .1-3 Lead EKG Interpretation  Performed by: Georga Hacking, MD Authorized by: Georga Hacking, MD     Interpretation: normal     ECG rate assessment: normal     Rhythm: sinus rhythm     Ectopy: none     Conduction: normal   .Paracentesis  Date/Time: 05/27/2022 9:11 PM  Performed by: Georga Hacking, MD Authorized by: Georga Hacking, MD   Consent:    Consent obtained:  Verbal   Risks discussed:  Bleeding and bowel perforation   Alternatives discussed:  No treatment, delayed treatment and referral Universal protocol:    Patient identity confirmed:  Verbally with patient Pre-procedure details:    Procedure purpose:  Therapeutic   Preparation: Patient was prepped and draped in usual sterile fashion   Anesthesia:    Anesthesia method:  Local infiltration   Local anesthetic:  Lidocaine 1% WITH epi Procedure details:    Needle gauge:  18   Ultrasound guidance: no     Puncture site:  R lower quadrant   Fluid removed amount:  4.5L   Fluid appearance:  Serous   Dressing:  4x4 sterile gauze Post-procedure details:    Procedure completion:  Tolerated Comments:     1 simple interrupted 5-0 Vicryl suture was placed for ascitic leak   The patient is on the cardiac monitor to evaluate for evidence of arrhythmia and/or significant heart rate changes.   MEDICATIONS ORDERED IN ED: Medications  lidocaine-EPINEPHrine (XYLOCAINE W/EPI) 2 %-1:100000 (with pres) injection 30 mL (has no administration in time range)     IMPRESSION / MDM / ASSESSMENT AND PLAN / ED COURSE  I reviewed the triage vital  signs and the nursing notes.                              Patient's presentation is most consistent with severe exacerbation of chronic illness.  Differential diagnosis includes, but  is not limited to, ascites due to ongoing portal hypertension, SBP, hepatic encephalopathy  Patient is a 56 year old male with end-stage cirrhosis who presents with abdominal distention.  He gets a biweekly paracentesis and was supposed to have an outpatient appointment at [facility] today but it was canceled and he is going as an outpatient next week.  He presents the emergency department because he does not think to be able to make it to next week.  He is distended but not in respiratory distress he is not having significant abdominal pain fevers to suggest SBP.  Abdominal exam is notable for significant distention but nontender.  Labs are notable for mild hyponatremia to 127 suspect this is in the setting of hypervolemia.  On spironolactone and furosemide which I encouraged to continue.  LFTs are near baseline.  Hemoglobin also near baseline.  Low suspicion for hepatic encephalopathy given patient's mental status and history from the son.  Ammonia is elevated but this is chronically elevated.  He is taking lactulose and having 2-3 BMs per day.  Overall I do not feel that there is a strong indication for admission given patient is otherwise at his baseline.  Urgent paracentesis also not entirely necessary but because patient is symptomatic I performed a large-volume paracentesis taking off about 4-1/2 L at the bedside.  Patient tolerated this well.  Advised that he follow-up next week at [facility] he will likely have to have another paracentesis as I see he routinely gets about 9 L taken off.     Discussed with the patient fluid restriction and maintaining his diuretic use.  And close outpatient follow-up to have sodium rechecked.  I advised the son to take him back immediately if he develops altered mental status.  FINAL  CLINICAL IMPRESSION(S) / ED DIAGNOSES   Final diagnoses:  Other ascites  Hyponatremia     Rx / DC Orders   ED Discharge Orders     None        Note:  This document was prepared using Dragon voice recognition software and may include unintentional dictation errors.   Rada Hay, MD 05/27/22 2113

## 2022-05-27 NOTE — ED Triage Notes (Signed)
Pt states he need paracentesis, states last time was about 3 weeks ago, states he normally goes to [facility] but cant get an appt. Denies any pain or other sx. Pt has noted abd distention

## 2022-07-24 ENCOUNTER — Inpatient Hospital Stay
Admission: EM | Admit: 2022-07-24 | Discharge: 2022-07-27 | DRG: 442 | Disposition: A | Discharge status: Home / Self Care | Payer: Self-pay | Attending: Internal Medicine | Admitting: Internal Medicine

## 2022-07-24 ENCOUNTER — Emergency Department: Payer: Self-pay

## 2022-07-24 ENCOUNTER — Encounter: Payer: Self-pay | Admitting: Family Medicine

## 2022-07-24 ENCOUNTER — Inpatient Hospital Stay: Payer: Self-pay

## 2022-07-24 DIAGNOSIS — E1122 Type 2 diabetes mellitus with diabetic chronic kidney disease: Secondary | ICD-10-CM | POA: Diagnosis present

## 2022-07-24 DIAGNOSIS — Z91148 Patient's other noncompliance with medication regimen for other reason: Secondary | ICD-10-CM

## 2022-07-24 DIAGNOSIS — Z8616 Personal history of COVID-19: Secondary | ICD-10-CM

## 2022-07-24 DIAGNOSIS — R140 Abdominal distension (gaseous): Secondary | ICD-10-CM | POA: Diagnosis present

## 2022-07-24 DIAGNOSIS — N182 Chronic kidney disease, stage 2 (mild): Secondary | ICD-10-CM | POA: Diagnosis present

## 2022-07-24 DIAGNOSIS — K7210 Chronic hepatic failure without coma: Secondary | ICD-10-CM | POA: Diagnosis present

## 2022-07-24 DIAGNOSIS — D61818 Other pancytopenia: Secondary | ICD-10-CM | POA: Diagnosis present

## 2022-07-24 DIAGNOSIS — E119 Type 2 diabetes mellitus without complications: Secondary | ICD-10-CM

## 2022-07-24 DIAGNOSIS — K7031 Alcoholic cirrhosis of liver with ascites: Secondary | ICD-10-CM | POA: Diagnosis present

## 2022-07-24 DIAGNOSIS — K5900 Constipation, unspecified: Secondary | ICD-10-CM | POA: Diagnosis present

## 2022-07-24 DIAGNOSIS — Z79899 Other long term (current) drug therapy: Secondary | ICD-10-CM

## 2022-07-24 DIAGNOSIS — R4182 Altered mental status, unspecified: Principal | ICD-10-CM

## 2022-07-24 DIAGNOSIS — D696 Thrombocytopenia, unspecified: Secondary | ICD-10-CM | POA: Diagnosis present

## 2022-07-24 DIAGNOSIS — K7682 Hepatic encephalopathy: Principal | ICD-10-CM | POA: Diagnosis present

## 2022-07-24 DIAGNOSIS — R443 Hallucinations, unspecified: Secondary | ICD-10-CM | POA: Diagnosis present

## 2022-07-24 DIAGNOSIS — K219 Gastro-esophageal reflux disease without esophagitis: Secondary | ICD-10-CM | POA: Diagnosis present

## 2022-07-24 DIAGNOSIS — I129 Hypertensive chronic kidney disease with stage 1 through stage 4 chronic kidney disease, or unspecified chronic kidney disease: Secondary | ICD-10-CM | POA: Diagnosis present

## 2022-07-24 LAB — COMPREHENSIVE METABOLIC PANEL
ALT: 17 U/L (ref 0–44)
AST: 39 U/L (ref 15–41)
Albumin: 2.8 g/dL — ABNORMAL LOW (ref 3.5–5.0)
Alkaline Phosphatase: 112 U/L (ref 38–126)
Anion gap: 10 (ref 5–15)
BUN: 25 mg/dL — ABNORMAL HIGH (ref 6–20)
CO2: 21 mmol/L — ABNORMAL LOW (ref 22–32)
Calcium: 8 mg/dL — ABNORMAL LOW (ref 8.9–10.3)
Chloride: 109 mmol/L (ref 98–111)
Creatinine, Ser: 1.28 mg/dL — ABNORMAL HIGH (ref 0.61–1.24)
GFR, Estimated: >60 mL/min (ref >60)
Glucose, Bld: 137 mg/dL — ABNORMAL HIGH (ref 70–99)
Potassium: 3.5 mmol/L (ref 3.5–5.1)
Sodium: 140 mmol/L (ref 135–145)
Total Bilirubin: 1.7 mg/dL — ABNORMAL HIGH (ref 0.3–1.2)
Total Protein: 6.2 g/dL — ABNORMAL LOW (ref 6.5–8.1)

## 2022-07-24 LAB — PROTIME-INR
INR: 1.8 — ABNORMAL HIGH (ref 0.8–1.2)
Prothrombin Time: 20.9 seconds — ABNORMAL HIGH (ref 11.4–15.2)

## 2022-07-24 LAB — CBC WITH DIFFERENTIAL/PLATELET
Abs Immature Granulocytes: 0.01 10*3/uL (ref 0.00–0.07)
Basophils Absolute: 0.1 10*3/uL (ref 0.0–0.1)
Basophils Relative: 2 %
Eosinophils Absolute: 0.1 10*3/uL (ref 0.0–0.5)
Eosinophils Relative: 2 %
HCT: 29.1 % — ABNORMAL LOW (ref 39.0–52.0)
Hemoglobin: 9.6 g/dL — ABNORMAL LOW (ref 13.0–17.0)
Immature Granulocytes: 0 %
Lymphocytes Relative: 25 %
Lymphs Abs: 0.9 10*3/uL (ref 0.7–4.0)
MCH: 33.2 pg (ref 26.0–34.0)
MCHC: 33 g/dL (ref 30.0–36.0)
MCV: 100.7 fL — ABNORMAL HIGH (ref 80.0–100.0)
Monocytes Absolute: 0.4 10*3/uL (ref 0.1–1.0)
Monocytes Relative: 11 %
Neutro Abs: 2.3 10*3/uL (ref 1.7–7.7)
Neutrophils Relative %: 60 %
Platelets: 124 10*3/uL — ABNORMAL LOW (ref 150–400)
RBC: 2.89 MIL/uL — ABNORMAL LOW (ref 4.22–5.81)
RDW: 14.1 % (ref 11.5–15.5)
WBC: 3.7 10*3/uL — ABNORMAL LOW (ref 4.0–10.5)
nRBC: 0 % (ref 0.0–0.2)

## 2022-07-24 LAB — HEMOGLOBIN A1C
Hgb A1c MFr Bld: 4.8 % (ref 4.8–5.6)
Mean Plasma Glucose: 91.06 mg/dL

## 2022-07-24 LAB — APTT: aPTT: 36 seconds (ref 24–36)

## 2022-07-24 LAB — MAGNESIUM: Magnesium: 1.9 mg/dL (ref 1.7–2.4)

## 2022-07-24 LAB — AMMONIA: Ammonia: 104 umol/L — ABNORMAL HIGH (ref 9–35)

## 2022-07-24 LAB — ETHANOL: Alcohol, Ethyl (B): <10 mg/dL (ref <10)

## 2022-07-24 LAB — TSH: TSH: 2.062 u[IU]/mL (ref 0.350–4.500)

## 2022-07-24 MED ORDER — FOLIC ACID 1 MG PO TABS
1.0000 mg | ORAL_TABLET | Freq: Every day | ORAL | Status: DC
  Administered 2022-07-26 – 2022-07-27 (×2): 1 mg via ORAL
  Filled 2022-07-24 (×3): qty 1

## 2022-07-24 MED ORDER — METOPROLOL TARTRATE 5 MG/5ML IV SOLN: 5.0000 mg | Freq: Four times a day (QID) | INTRAVENOUS | Status: DC | PRN

## 2022-07-24 MED ORDER — LACTATED RINGERS IV SOLN
INTRAVENOUS | Status: DC
Start: 2022-07-24 — End: 2022-07-27

## 2022-07-24 MED ORDER — LACTULOSE 10 GM/15ML PO SOLN
30.0000 g | Freq: Once | ORAL | Status: AC
  Administered 2022-07-24: 30 g
  Filled 2022-07-24: qty 60

## 2022-07-24 MED ORDER — PANTOPRAZOLE SODIUM 40 MG PO TBEC
40.0000 mg | DELAYED_RELEASE_TABLET | Freq: Two times a day (BID) | ORAL | Status: DC
  Administered 2022-07-24 – 2022-07-27 (×5): 40 mg via ORAL
  Filled 2022-07-24 (×6): qty 1

## 2022-07-24 MED ORDER — LORAZEPAM 2 MG/ML IJ SOLN
1.0000 mg | Freq: Once | INTRAMUSCULAR | Status: AC
  Administered 2022-07-24: 1 mg via INTRAVENOUS

## 2022-07-24 MED ORDER — LORAZEPAM 2 MG/ML IJ SOLN
1.0000 mg | INTRAMUSCULAR | Status: DC | PRN
  Administered 2022-07-24: 2 mg via INTRAVENOUS
  Administered 2022-07-24: 1 mg via INTRAVENOUS
  Administered 2022-07-25: 2 mg via INTRAVENOUS
  Filled 2022-07-24 (×3): qty 1

## 2022-07-24 MED ORDER — FENTANYL CITRATE PF 50 MCG/ML IJ SOSY: 12.5000 ug | PREFILLED_SYRINGE | INTRAMUSCULAR | Status: DC | PRN

## 2022-07-24 MED ORDER — ONDANSETRON HCL 4 MG PO TABS: 4.0000 mg | ORAL_TABLET | Freq: Four times a day (QID) | ORAL | Status: DC | PRN

## 2022-07-24 MED ORDER — SPIRONOLACTONE 25 MG PO TABS
100.0000 mg | ORAL_TABLET | Freq: Every day | ORAL | Status: DC
  Administered 2022-07-26 – 2022-07-27 (×2): 100 mg via ORAL
  Filled 2022-07-24 (×3): qty 4

## 2022-07-24 MED ORDER — ADULT MULTIVITAMIN W/MINERALS CH
1.0000 | ORAL_TABLET | Freq: Every day | ORAL | Status: DC
  Administered 2022-07-26 – 2022-07-27 (×2): 1 via ORAL
  Filled 2022-07-24 (×3): qty 1

## 2022-07-24 MED ORDER — SODIUM BICARBONATE 650 MG PO TABS: 650.0000 mg | ORAL_TABLET | Freq: Once | ORAL | Status: DC

## 2022-07-24 MED ORDER — PANCRELIPASE (LIP-PROT-AMYL) 10440-39150 UNITS PO TABS
20880.0000 [IU] | ORAL_TABLET | Freq: Once | ORAL | Status: DC
  Filled 2022-07-24: qty 2

## 2022-07-24 MED ORDER — SODIUM CHLORIDE 0.9 % IV SOLN
2.0000 g | Freq: Once | INTRAVENOUS | Status: AC
  Administered 2022-07-24: 2 g via INTRAVENOUS
  Filled 2022-07-24: qty 20

## 2022-07-24 MED ORDER — CARVEDILOL 6.25 MG PO TABS
6.2500 mg | ORAL_TABLET | Freq: Two times a day (BID) | ORAL | Status: DC
  Administered 2022-07-24 – 2022-07-27 (×5): 6.25 mg via ORAL
  Filled 2022-07-24 (×6): qty 1

## 2022-07-24 MED ORDER — LORAZEPAM 1 MG PO TABS: 1.0000 mg | ORAL_TABLET | ORAL | Status: DC | PRN

## 2022-07-24 MED ORDER — LACTATED RINGERS IV BOLUS
1000.0000 mL | Freq: Once | INTRAVENOUS | Status: AC
  Administered 2022-07-24: 1000 mL via INTRAVENOUS

## 2022-07-24 MED ORDER — ONDANSETRON HCL 4 MG/2ML IJ SOLN: 4.0000 mg | Freq: Four times a day (QID) | INTRAMUSCULAR | Status: DC | PRN

## 2022-07-24 MED ORDER — THIAMINE HCL 100 MG/ML IJ SOLN
100.0000 mg | Freq: Every day | INTRAMUSCULAR | Status: DC
  Administered 2022-07-24: 100 mg via INTRAVENOUS
  Filled 2022-07-24 (×2): qty 2

## 2022-07-24 MED ORDER — THIAMINE MONONITRATE 100 MG PO TABS
100.0000 mg | ORAL_TABLET | Freq: Every day | ORAL | Status: DC
  Administered 2022-07-25 – 2022-07-27 (×3): 100 mg via ORAL
  Filled 2022-07-24 (×3): qty 1

## 2022-07-24 MED ORDER — FUROSEMIDE 40 MG PO TABS
40.0000 mg | ORAL_TABLET | Freq: Every day | ORAL | Status: DC
  Administered 2022-07-26 – 2022-07-27 (×2): 40 mg via ORAL
  Filled 2022-07-24 (×3): qty 1

## 2022-07-24 MED ORDER — FOLIC ACID 1 MG PO TABS: 1.0000 mg | ORAL_TABLET | Freq: Every day | ORAL | Status: DC

## 2022-07-24 MED ORDER — TRAMADOL HCL 50 MG PO TABS
50.0000 mg | ORAL_TABLET | Freq: Three times a day (TID) | ORAL | Status: DC | PRN
  Administered 2022-07-26: 50 mg via ORAL
  Filled 2022-07-24 (×2): qty 1

## 2022-07-24 MED ORDER — LORAZEPAM 2 MG/ML IJ SOLN
INTRAMUSCULAR | Status: AC
  Administered 2022-07-24: 1 mg via INTRAVENOUS
  Filled 2022-07-24: qty 1

## 2022-07-24 MED ORDER — LIDOCAINE-EPINEPHRINE 2 %-1:100000 IJ SOLN
20.0000 mL | Freq: Once | INTRAMUSCULAR | Status: AC
  Administered 2022-07-24: 20 mL
  Filled 2022-07-24: qty 1

## 2022-07-24 MED ORDER — LACTULOSE 10 GM/15ML PO SOLN
20.0000 g | Freq: Three times a day (TID) | ORAL | Status: DC
  Administered 2022-07-24 – 2022-07-27 (×8): 20 g
  Filled 2022-07-24 (×8): qty 30

## 2022-07-24 MED ORDER — LORAZEPAM 2 MG/ML IJ SOLN: 1.0000 mg | Freq: Once | INTRAMUSCULAR | Status: AC

## 2022-07-24 MED ORDER — POLYETHYLENE GLYCOL 3350 17 G PO PACK: 17.0000 g | PACK | Freq: Every day | ORAL | Status: DC | PRN

## 2022-07-24 MED ORDER — HALOPERIDOL LACTATE 5 MG/ML IJ SOLN
INTRAMUSCULAR | Status: AC
  Filled 2022-07-24: qty 1

## 2022-07-24 NOTE — Assessment & Plan Note (Signed)
PPI ?

## 2022-07-24 NOTE — Assessment & Plan Note (Signed)
Suspect his now pancytopenia is related to cirrhotic liver disease and splenomegaly

## 2022-07-24 NOTE — Progress Notes (Signed)
Attempted to update family, with no answer from son, daughters, or spouse.

## 2022-07-24 NOTE — ED Notes (Addendum)
Interpreter to bedside to communicate with pt. Pt not responding to questions or commands, no evidence of understanding, unable to assess LOC. Pt to CT at this time.

## 2022-07-24 NOTE — Progress Notes (Signed)
       CROSS COVER NOTE  NAME: Alwyn Cordner MRN: [id number] DOB : May 02, 1966    Date of Service   07/24/2022   HPI/Events of Note   Nursing reports Mr Pollitt' NGT is clogged and not flushing. Per report tonight at change of shift the NGT was hanging out and not secured.  Interventions   Plan: Abdominal Xray--> Recommends advancing NGT 5cm Advanced 5cm ---> NGT now flushing fine     This document was prepared using Dragon voice recognition software and may include unintentional dictation errors.  Bishop Limbo DNP, MHA, FNP-BC Nurse Practitioner [facility] Pager [phone]

## 2022-07-24 NOTE — ED Notes (Signed)
This RN, Dr. Sidney Ace, EDP, and Jess, RN at bedside, pt is uncooperative and confused. Medical interpreter at bedside also but patient is continuously confused attempting to get out of bed, pulling at lines and cords. Pt is confused and unable to follow commands.This RN and Dr. Sidney Ace, EDP placed patient into bilateral soft restraints at this time.

## 2022-07-24 NOTE — Assessment & Plan Note (Signed)
Likely related to liver disease

## 2022-07-24 NOTE — Assessment & Plan Note (Addendum)
Apparently has not had a drink in > 1 year. Continue lasix and Lactulose and aldactone Has abnormal coagulation related to this with INR of 1.8. Holding DVT ppx. S/p therapeutic paracentesis at [facility] on 07/19/2022--with removal of 8 liters and appears to go weekly for similar.

## 2022-07-24 NOTE — ED Provider Notes (Signed)
[facility] Provider Note    Event Date/Time   First MD Initiated Contact with Patient 07/24/22 1236     (approximate)   History   Altered Mental Status (Pt arrives via EMS from home w/ c/o AMS. According to EMS, daughter went to house to check on him and found him altered, combative and hallucinating. Pt is spanish speaking only, daughter told EMS his words weren't making any sense. Pt does not have psych hx, does have hx high ammonia levels, has lactulose rx. Pt is Aox0. Per EMS, pt was ambulatory on scene and combative so they gave him 5mg  IM versed. He was also given NS en route. //EKG unremark/80s sinus/130/77/97.1/148 bg/100% ra)   HPI  Shawn Torres is a 56 y.o. male   with past medical history of cirrhosis due to chronic alcohol use, hepatic encephalopathy, diabetes presents with altered mental status.  Per EMS the patient's daughter went and checked on him and found him to be very altered hallucinating and combative.  When EMS arrived on scene patient was quite combative so he received 5 mg of IM Versed and has since been quite sedate.  I am unable to obtain any history from the patient given his sedation.  History attempted with interpreter.  He was given 400 mL NS in addition to the Versed with EMS.     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   Confusion    SOB (shortness of breath)    Lactic acidosis 11/06/2021   GERD (gastroesophageal reflux disease) 11/06/2021   BPH (benign prostatic hyperplasia) 11/06/2021   Stage 3a chronic kidney  disease (CKD) (HCC) 11/03/2021   Macrocytic anemia 11/03/2021   Alcoholic cirrhosis of liver with ascites (HCC) 10/29/2021   Alcohol use disorder, severe, in early remission (HCC)    Malnutrition of moderate degree 10/15/2021   Abdominal distention    Acute respiratory failure with hypoxia (HCC)    Secondary esophageal varices with bleeding (HCC)    Hematemesis 10/10/2021   Hepatic encephalopathy (HCC)    Secondary esophageal varices without bleeding (HCC)    Thrombocytopenia (HCC) 04/30/2021   Type 2 diabetes mellitus, without long-term current use of insulin (HCC) 04/30/2021   Hyponatremia 05/24/2019   Pneumonia due to COVID-19 virus 05/24/2019   Transaminitis 05/24/2019   Alcohol dependence with uncomplicated withdrawal (HCC) 05/24/2019   Hypokalemia 05/24/2019   COVID-19 05/24/2019     Physical Exam  Triage Vital Signs: ED Triage Vitals [07/24/22 1240]  Enc Vitals Group     BP (!) 160/100     Pulse Rate 84     Resp 13     Temp (!) 97.1 F (36.2 C)     Temp Source Oral     SpO2 100 %     Weight      Height      Head Circumference      Peak Flow      Pain Score      Pain Loc      Pain Edu?  Excl. in [place]?     Most recent vital signs: Vitals:   07/24/22 1240 07/24/22 1330  BP: (!) 160/100 117/70  Pulse: 84 70  Resp: 13 12  Temp: (!) 97.1 F (36.2 C)   SpO2: 100% 100%     General: Patient looks chronically ill, eyes are closed but he is moving his extremities CV:  Good peripheral perfusion.  Minimal pitting edema Resp:  Normal effort.  No increased work of breathing Abd:  Abdomen is distended, no guarding, patient does not react to palpation Neuro:            Patient's eyes are closed, he moans in response to pain, does move all of his extremities but does not follow commands, mild tremor in the right upper extremity Other:     ED Results / Procedures / Treatments  Labs (all labs ordered are listed, but only abnormal results are displayed) Labs  Reviewed  COMPREHENSIVE METABOLIC PANEL - Abnormal; Notable for the following components:      Result Value   CO2 21 (*)    Glucose, Bld 137 (*)    BUN 25 (*)    Creatinine, Ser 1.28 (*)    Calcium 8.0 (*)    Total Protein 6.2 (*)    Albumin 2.8 (*)    Total Bilirubin 1.7 (*)    All other components within normal limits  CBC WITH DIFFERENTIAL/PLATELET - Abnormal; Notable for the following components:   WBC 3.7 (*)    RBC 2.89 (*)    Hemoglobin 9.6 (*)    HCT 29.1 (*)    MCV 100.7 (*)    Platelets 124 (*)    All other components within normal limits  AMMONIA - Abnormal; Notable for the following components:   Ammonia 104 (*)    All other components within normal limits  PROTIME-INR - Abnormal; Notable for the following components:   Prothrombin Time 20.9 (*)    INR 1.8 (*)    All other components within normal limits  ETHANOL  MAGNESIUM  APTT  URINE DRUG SCREEN, QUALITATIVE ([facility] ONLY)  URINALYSIS, ROUTINE W REFLEX MICROSCOPIC     EKG  EKG reviewed by myself shows normal sinus rhythm with significant baseline artifact, left axis deviation, low voltage, poor R wave progression  RADIOLOGY Reviewed and interpreted the chest x-ray which shows low lung volumes   PROCEDURES:  Critical Care performed: Yes, see critical care procedure note(s)  .1-3 Lead EKG Interpretation  Performed by: Rada Hay, MD Authorized by: Rada Hay, MD     Interpretation: normal     ECG rate assessment: normal     Rhythm: sinus rhythm     Ectopy: none     Conduction: normal   .Critical Care  Performed by: Rada Hay, MD Authorized by: Rada Hay, MD   Critical care provider statement:    Critical care time (minutes):  30   Critical care was time spent personally by me on the following activities:  Development of treatment plan with patient or surrogate, discussions with consultants, evaluation of patient's response to treatment, examination of patient,  ordering and review of laboratory studies, ordering and review of radiographic studies, ordering and performing treatments and interventions, pulse oximetry, re-evaluation of patient's condition and review of old charts   The patient is on the cardiac monitor to evaluate for evidence of arrhythmia and/or significant heart rate changes.   MEDICATIONS ORDERED IN ED: Medications  lactulose (CHRONULAC) 10 GM/15ML solution 30  g (has no administration in time range)  lactated ringers bolus 1,000 mL (has no administration in time range)  lidocaine-EPINEPHrine (XYLOCAINE W/EPI) 2 %-1:100000 (with pres) injection 20 mL (20 mLs Infiltration Given 07/24/22 1425)  LORazepam (ATIVAN) injection 1 mg (1 mg Intravenous Given 07/24/22 1416)  LORazepam (ATIVAN) injection 1 mg (1 mg Intravenous Given 07/24/22 1422)     IMPRESSION / MDM / ASSESSMENT AND PLAN / ED COURSE  I reviewed the triage vital signs and the nursing notes.                              Patient's presentation is most consistent with acute presentation with potential threat to life or bodily function.  Differential diagnosis includes, but is not limited to, hepatic encephalopathy secondary to medication noncompliance, electrolyte abnormality, renal failure, action such as SBP, pneumonia, GI bleed, acute intracranial hemorrhage, CVA, intoxication, withdrawal  Patient is a 56 year old male with cirrhosis who presents with altered mental status.  Patient was apparently combative and hallucinating prior to receiving 5 mg of IM Versed with EMS.  On arrival to the ED he is quite sedated he does not open his eyes or really follow commands.  When I sternal rub him he does groan somewhat and has have seen and move all of his extremities.  Pupils are equal and reactive.  Patient has a large distended abdomen that is soft.  He does have some bruising on his extremities.  I suspect that this is hepatic encephalopathy and will search for underlying cause.  We  will also obtain CT head to rule intracranial hemorrhage as patient certainly at high risk for this.  Patient protecting his airway at this time.  I attempted to call patient's daughters with both phone numbers that are listed in the chart but was unable to get through.  Patient's labs show an ammonia of 100, hemoglobin is near baseline at 9.6.  Renal function not far off baseline.  T. bili mildly elevated at 1.7.  LFTs otherwise unremarkable.  CT head is negative for acute abnormality.  Chest x-ray also just showing low lung volumes.  Patient seems to be waking up more after the Versed was wearing off.  Trying to get out of bed.  Need additional blood draw so was given 1 mg of IV Versed to facilitate this.  Need additional milligram to allow for NG tube placement.  Patient not able to take p.o. lactulose at this time given mental status.  NG tube inserted to give lactulose.  I did attempt a diagnostic paracentesis to evaluate for SBP as a precipitant of his presumed hepatic encephalopathy but patient was moving too much and did not feel that this was safe at this time.  Point patient is protecting his airway and is just very altered but I do not think he requires intubation at this point.  He is in soft restraints but when we are not bothering him he is resting comfortably.  When I sternal rub if he says "hey".  He is not showing any obvious seizure-like activity. Clinical Course as of 07/24/22 1509  Sun Jul 24, 2022  1404 Albumin(!): 2.8 [KM]    Clinical Course User Index [KM] Georga Hacking, MD     FINAL CLINICAL IMPRESSION(S) / ED DIAGNOSES   Final diagnoses:  Altered mental status, unspecified altered mental status type  Encephalopathy, hepatic (HCC)     Rx / DC Orders   ED  Discharge Orders     None        Note:  This document was prepared using Dragon voice recognition software and may include unintentional dictation errors.   Rada Hay, MD 07/24/22 [phone]

## 2022-07-24 NOTE — H&P (Signed)
History and Physical    Patient: Shawn Torres DOB: 03-15-1966 DOA: 07/24/2022 DOS: the patient was seen and examined on 07/24/2022 PCP: Caryl Never, MD  Patient coming from: Home  Chief Complaint:  Chief Complaint  Patient presents with   Altered Mental Status    Pt arrives via EMS from home w/ c/o AMS. According to EMS, daughter went to house to check on him and found him altered, combative and hallucinating. Pt is spanish speaking only, daughter told EMS his words weren't making any sense. Pt does not have psych hx, does have hx high ammonia levels, has lactulose rx. Pt is Aox0. Per EMS, pt was ambulatory on scene and combative so they gave him 5mg  IM versed. He was also given 468ml NS en route.   EKG unremark 80s sinus 130/77 97.1 148 bg 100% ra   HPI: Shawn Torres is a 56 y.o. male with medical history significant of cirrhosis and T2DM, who was in his usual state of health yesterday when checked on by his daughter. Today he was hallucinating. Has h/o similar episodes with high ammonia levels and encephalopathy. EMS called and noted him to be combative and altered. Given IM Versed. Required sedation in the ED as well. He has a distended abdomen. NG placed to give lactulose.  Ammonia noted to be > 100. Other labs ok except for INR of 1.8.  Review of Systems: Unable to review all systems due to lack of cooperation from patient. Past Medical History:  Diagnosis Date   Ascites    Cirrhosis, alcoholic ([place])    Diabetes mellitus without complication ([place])    Memory loss    Past Surgical History:  Procedure Laterality Date   ESOPHAGOGASTRODUODENOSCOPY N/A 10/10/2021   Procedure: ESOPHAGOGASTRODUODENOSCOPY (EGD);  Surgeon: [name], MD;  Location: [facility] ENDOSCOPY;  Service: Endoscopy;  Laterality: N/A;   ESOPHAGOGASTRODUODENOSCOPY N/A 12/27/2021   Procedure: ESOPHAGOGASTRODUODENOSCOPY (EGD);  Surgeon: Annamaria Helling, DO;  Location: [facility] ENDOSCOPY;   Service: Gastroenterology;  Laterality: N/A;  Spanish Interpreter   ESOPHAGOGASTRODUODENOSCOPY N/A 01/20/2022   Procedure: ESOPHAGOGASTRODUODENOSCOPY (EGD);  Surgeon: Annamaria Helling, DO;  Location: [facility] ENDOSCOPY;  Service: Gastroenterology;  Laterality: N/A;  Spanish Interpreter   ESOPHAGOGASTRODUODENOSCOPY N/A 02/03/2022   Procedure: ESOPHAGOGASTRODUODENOSCOPY (EGD);  Surgeon: Annamaria Helling, DO;  Location: [facility] ENDOSCOPY;  Service: Gastroenterology;  Laterality: N/A;  Needs Spanish Interpreter - (prefers) all calls to daughter (she speaks [place])   ESOPHAGOGASTRODUODENOSCOPY (EGD) WITH PROPOFOL N/A 12/11/2021   Procedure: ESOPHAGOGASTRODUODENOSCOPY (EGD) WITH PROPOFOL;  Surgeon: Annamaria Helling, DO;  Location: [place];  Service: Gastroenterology;  Laterality: N/A;   IR PARACENTESIS  02/15/2022   Social History:  reports that he has never smoked. He has never used smokeless tobacco. He reports that he does not currently use alcohol. He reports that he does not use drugs.  Allergies  Allergen Reactions   Albumin Gc Rash    Patient gets rash with albumin. See media tab picture dated 12/24/21. Needs pre-meds w/ albumin (benadryl) to avoid allergic reaction.     Family History  Family history unknown: Yes    Prior to Admission medications   Medication Sig Start Date End Date Taking? Authorizing Provider  carvedilol (COREG) 6.25 MG tablet Take 6.25 mg by mouth 2 (two) times daily. 03/10/22   [provider]  feeding supplement (ENSURE ENLIVE / ENSURE PLUS) LIQD Take 237 mLs by mouth 3 (three) times daily between meals. 11/03/21   Ezekiel Slocumb, DO  folic acid (FOLVITE)  1 MG tablet Take 1 tablet by mouth daily. 09/03/21 09/03/22  [provider]  furosemide (LASIX) 40 MG tablet Take 1 tablet (40 mg total) by mouth daily. 03/29/22   Wyvonnia Dusky, MD  lactulose (CHRONULAC) 10 GM/15ML solution Take 30 mLs (20 g total) by mouth 3 (three) times daily.  03/29/22   Wyvonnia Dusky, MD  pantoprazole (PROTONIX) 40 MG tablet Take 1 tablet (40 mg total) by mouth 2 (two) times daily. 12/14/21   Jennye Boroughs, MD  spironolactone (ALDACTONE) 100 MG tablet Take 1 tablet (100 mg total) by mouth daily. Patient not taking: Reported on 03/24/2022 11/04/21   Nicole Kindred A, DO  tamsulosin (FLOMAX) 0.4 MG CAPS capsule Take 1 capsule (0.4 mg total) by mouth daily. 10/17/21   Richarda Osmond, MD    Physical Exam: Vitals:   07/24/22 1240 07/24/22 1330  BP: (!) 160/100 117/70  Pulse: 84 70  Resp: 13 12  Temp: (!) 97.1 F (36.2 C)   TempSrc: Oral   SpO2: 100% 100%   Physical Examination: General appearance - chronically ill appearing and sedated, does not rouse to stimuli Chest - clear to auscultation, no wheezes, rales or rhonchi, symmetric air entry Heart - normal rate, regular rhythm, normal S1, S2, no murmurs, rubs, clicks or gallops Abdomen - distended, shifting dullness Extremities - no pedal edema noted Skin - erythematous plaques noted  Data Reviewed: . Results for orders placed or performed during the hospital encounter of 07/24/22 (from the past 24 hour(s))  Ammonia     Status: Abnormal   Collection Time: 07/24/22 12:49 PM  Result Value Ref Range   Ammonia 104 (H) 9 - 35 umol/L  Comprehensive metabolic panel     Status: Abnormal   Collection Time: 07/24/22 12:50 PM  Result Value Ref Range   Sodium 140 135 - 145 mmol/L   Potassium 3.5 3.5 - 5.1 mmol/L   Chloride 109 98 - 111 mmol/L   CO2 21 (L) 22 - 32 mmol/L   Glucose, Bld 137 (H) 70 - 99 mg/dL   BUN 25 (H) 6 - 20 mg/dL   Creatinine, Ser 1.28 (H) 0.61 - 1.24 mg/dL   Calcium 8.0 (L) 8.9 - 10.3 mg/dL   Total Protein 6.2 (L) 6.5 - 8.1 g/dL   Albumin 2.8 (L) 3.5 - 5.0 g/dL   AST 39 15 - 41 U/L   ALT 17 0 - 44 U/L   Alkaline Phosphatase 112 38 - 126 U/L   Total Bilirubin 1.7 (H) 0.3 - 1.2 mg/dL   GFR, Estimated >60 >60 mL/min   Anion gap 10 5 - 15  CBC with Differential      Status: Abnormal   Collection Time: 07/24/22 12:50 PM  Result Value Ref Range   WBC 3.7 (L) 4.0 - 10.5 K/uL   RBC 2.89 (L) 4.22 - 5.81 MIL/uL   Hemoglobin 9.6 (L) 13.0 - 17.0 g/dL   HCT 29.1 (L) 39.0 - 52.0 %   MCV 100.7 (H) 80.0 - 100.0 fL   MCH 33.2 26.0 - 34.0 pg   MCHC 33.0 30.0 - 36.0 g/dL   RDW 14.1 11.5 - 15.5 %   Platelets 124 (L) 150 - 400 K/uL   nRBC 0.0 0.0 - 0.2 %   Neutrophils Relative % 60 %   Neutro Abs 2.3 1.7 - 7.7 K/uL   Lymphocytes Relative 25 %   Lymphs Abs 0.9 0.7 - 4.0 K/uL   Monocytes Relative 11 %  Monocytes Absolute 0.4 0.1 - 1.0 K/uL   Eosinophils Relative 2 %   Eosinophils Absolute 0.1 0.0 - 0.5 K/uL   Basophils Relative 2 %   Basophils Absolute 0.1 0.0 - 0.1 K/uL   Immature Granulocytes 0 %   Abs Immature Granulocytes 0.01 0.00 - 0.07 K/uL  Ethanol     Status: None   Collection Time: 07/24/22 12:50 PM  Result Value Ref Range   Alcohol, Ethyl (B) <10 <10 mg/dL  Magnesium     Status: None   Collection Time: 07/24/22 12:50 PM  Result Value Ref Range   Magnesium 1.9 1.7 - 2.4 mg/dL  Protime-INR     Status: Abnormal   Collection Time: 07/24/22  1:45 PM  Result Value Ref Range   Prothrombin Time 20.9 (H) 11.4 - 15.2 seconds   INR 1.8 (H) 0.8 - 1.2  APTT     Status: None   Collection Time: 07/24/22  1:45 PM  Result Value Ref Range   aPTT 36 24 - 36 seconds   CT ABDOMEN PELVIS WO CONTRAST  Result Date: 07/24/2022 CLINICAL DATA:  Altered mental status. EXAM: CT ABDOMEN AND PELVIS WITHOUT CONTRAST TECHNIQUE: Multidetector CT imaging of the abdomen and pelvis was performed following the standard protocol without IV contrast. RADIATION DOSE REDUCTION: This exam was performed according to the departmental dose-optimization program which includes automated exposure control, adjustment of the mA and/or kV according to patient size and/or use of iterative reconstruction technique. COMPARISON:  December 18, 2021. FINDINGS: Lower chest: No acute abnormality.  Hepatobiliary: Cholelithiasis is noted. No biliary dilatation is noted. Nodular hepatic margins are noted consistent with hepatic cirrhosis. Pancreas: Unremarkable. No pancreatic ductal dilatation or surrounding inflammatory changes. Spleen: Normal in size without focal abnormality. Adrenals/Urinary Tract: Adrenal glands are unremarkable. Kidneys are normal, without renal calculi, focal lesion, or hydronephrosis. Bladder is unremarkable. Stomach/Bowel: Stomach is within normal limits. Appendix appears normal. No evidence of bowel wall thickening, distention, or inflammatory changes. Stool is noted throughout the colon. Vascular/Lymphatic: No significant vascular findings are present. No enlarged abdominal or pelvic lymph nodes. Reproductive: Prostate is unremarkable. Other: Moderate ascites is noted.  No hernia is noted. Musculoskeletal: No acute or significant osseous findings. IMPRESSION: Hepatic cirrhosis is again noted with moderate ascites. Cholelithiasis. Large amount of stool seen throughout the colon. Electronically Signed   By: Lupita Raider M.D.   On: 07/24/2022 16:05   DG Abdomen 1 View  Result Date: 07/24/2022 CLINICAL DATA:  NG tube placement EXAM: ABDOMEN - 1 VIEW COMPARISON:  03/27/2022 FINDINGS: Tip of NG tube is seen in the region of body of stomach. There is dilation of small-bowel loops in the visualized upper abdomen measuring up to 3.3 cm in diameter. Lower abdomen and pelvis are not included in the images. IMPRESSION: Tip of NG tube is seen in the stomach. There is dilation of small-bowel loops suggesting ileus or early small bowel obstruction Electronically Signed   By: Ernie Avena M.D.   On: 07/24/2022 14:57   DG Abdomen 1 View  Result Date: 07/24/2022 CLINICAL DATA:  NG tube placement EXAM: ABDOMEN - 1 VIEW COMPARISON:  None Available. FINDINGS: Esophagogastric tube with tip at the level of the gastroesophageal junction, side port above the diaphragm. Gas-filled although  nondistended small bowel in the central abdomen. No obvious free air on single supine radiograph. IMPRESSION: 1. Esophagogastric tube with tip at the level of the gastroesophageal junction, side port above the diaphragm. Recommend advancement to ensure subdiaphragmatic position. 2.  Gas-filled although nondistended small bowel in the central abdomen. Electronically Signed   By: Delanna Ahmadi M.D.   On: 07/24/2022 14:54   CT HEAD WO CONTRAST (5MM)  Result Date: 07/24/2022 CLINICAL DATA:  Mental status changes. EXAM: CT HEAD WITHOUT CONTRAST TECHNIQUE: Contiguous axial images were obtained from the base of the skull through the vertex without intravenous contrast. RADIATION DOSE REDUCTION: This exam was performed according to the departmental dose-optimization program which includes automated exposure control, adjustment of the mA and/or kV according to patient size and/or use of iterative reconstruction technique. COMPARISON:  03/24/2022 FINDINGS: Brain: There is no evidence for acute hemorrhage, hydrocephalus, mass lesion, or abnormal extra-axial fluid collection. No definite CT evidence for acute infarction. Vascular: No hyperdense vessel or unexpected calcification. Skull: No evidence for fracture. No worrisome lytic or sclerotic lesion. Sinuses/Orbits: The visualized paranasal sinuses and mastoid air cells are clear. Stable appearance old right medial orbital wall blowout fracture comparing to 02/13/2022. Other: None. IMPRESSION: 1. No acute intracranial abnormality. 2. Old right medial orbital wall blowout fracture. Electronically Signed   By: Misty Stanley M.D.   On: 07/24/2022 13:33   DG Chest Portable 1 View  Result Date: 07/24/2022 CLINICAL DATA:  Shortness of breath EXAM: PORTABLE CHEST 1 VIEW COMPARISON:  Radiograph 03/27/2022 FINDINGS: Unchanged mildly enlarged cardiac silhouette. Low lung volumes with bibasilar subsegmental atelectasis. Mild interstitial opacities likely due to vascular crowding. No  definite new airspace disease. No large pleural effusion. No pneumothorax. No acute osseous abnormality. IMPRESSION: Low lung volumes with bibasilar subsegmental atelectasis and vascular crowding. No definite new airspace disease. Electronically Signed   By: Maurine Simmering M.D.   On: 07/24/2022 13:04     Assessment and Plan: * Hepatic encephalopathy ([place]) Restart lactulose and see how he responds Ordered Ativan per CIWA in case he becomes combative again.  GERD (gastroesophageal reflux disease) PPI  Pancytopenia ([place]) Suspect his now pancytopenia is related to cirrhotic liver disease and splenomegaly  CKD (chronic kidney disease) stage 2, GFR 60-89 ml/min Likely some degree of hepatorenal disease  Type 2 diabetes mellitus, without long-term current use of insulin (HCC) Carb modified diet  Thrombocytopenia (HCC) Likely related to liver disease  Alcoholic cirrhosis of liver with ascites (HCC) Apparently has not had a drink in > 1 year. Continue lasix and Lactulose and aldactone Has abnormal coagulation related to this with INR of 1.8. Holding DVT ppx. S/p therapeutic paracentesis at [facility] on 07/19/2022--with removal of 8 liters and appears to go weekly for similar.  Abdominal distention Likely related to cirrhosis and ascites, CT abdomen pending      Advance Care Planning:   Code Status: Prior Unknown, cannot get any family member on the phone  Consults: none  Family Communication: none at bedside or available by phone  Severity of Illness: The appropriate patient status for this patient is INPATIENT. Inpatient status is judged to be reasonable and necessary in order to provide the required intensity of service to ensure the patient's safety. The patient's presenting symptoms, physical exam findings, and initial radiographic and laboratory data in the context of their chronic comorbidities is felt to place them at high risk for further clinical deterioration. Furthermore, it is not  anticipated that the patient will be medically stable for discharge from the hospital within 2 midnights of admission.   * I certify that at the point of admission it is my clinical judgment that the patient will require inpatient hospital care spanning beyond 2 midnights from the point  of admission due to high intensity of service, high risk for further deterioration and high frequency of surveillance required.*  Author: Reva Bores, MD 07/24/2022 4:29 PM  For on call review [email or web address].

## 2022-07-24 NOTE — Assessment & Plan Note (Addendum)
Likely related to cirrhosis and ascites, CT abdomen pending

## 2022-07-24 NOTE — Assessment & Plan Note (Signed)
Carb modified diet 

## 2022-07-24 NOTE — ED Notes (Signed)
Efrain RN aware of assigned bed 

## 2022-07-24 NOTE — Progress Notes (Signed)
Restraints removed, sitter in with patient

## 2022-07-24 NOTE — Assessment & Plan Note (Addendum)
Restart lactulose and see how he responds Ordered Ativan per CIWA in case he becomes combative again.

## 2022-07-24 NOTE — Assessment & Plan Note (Signed)
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

## 2022-07-25 DIAGNOSIS — K7682 Hepatic encephalopathy: Principal | ICD-10-CM

## 2022-07-25 LAB — AMMONIA: Ammonia: 117 umol/L — ABNORMAL HIGH (ref 9–35)

## 2022-07-25 NOTE — Progress Notes (Addendum)
PROGRESS NOTE    Shawn Torres  WER:[id number] DOB: [birth year] DOA: 07/24/2022  PCP: Kurtis Bushman, MD   Brief Narrative: This 56 years old male with PMH significant for liver cirrhosis, type 2 diabetes presented in the ED with altered mental status.  History is obtained from daughter who reports patient was in his usual state of health until yesterday.  Her daughter went to check on him and found him hallucinating and combative.  He does have similar episodes in the past when his ammonia level goes up.  EMS was called and patient was noted to be combative and with altered mentation.  He was given IM Versed and required sedation.  He has distended abdomen, NG tube was inserted in the ED.  Ammonia level found to be more than 100. Patient is admitted for hepatic encephalopathy, requiring IV diuretics and lactulose.  Assessment & Plan:   Principal Problem:   Hepatic encephalopathy (HCC) Active Problems:   Abdominal distention   Alcoholic cirrhosis of liver with ascites (HCC)   Thrombocytopenia (HCC)   Type 2 diabetes mellitus, without long-term current use of insulin (HCC)   CKD (chronic kidney disease) stage 2, GFR 60-89 ml/min   Pancytopenia (HCC)   GERD (gastroesophageal reflux disease)   Hepatic encephalopathy: Patient presented with altered mentation and hallucinations. Ammonia level 100, does not have any bowel movement last couple of days Triggering factor could be constipation and noncompliance with lactulose. NG tube was inserted in the ED, continue lactulose q6hrs Patient seems improved, daughter at bedside reports improvement. Continue to monitor ammonia level.  GERD: Continue pantoprazole 40 mg daily.  Pancytopenia: Suspect secondary to cirrhotic liver disease and splenomegaly. Does not require any blood transfusion at this time.  CKD stage II: Serum creatinine at baseline. Serum creatinine 1.2-1.6 @ baseline. Avoid nephrotoxic medications. Continue to  monitor serum creatinine  Type 2 diabetes: Carb modified diet, Hb A1c 4.8 Diet controlled.   Alcoholic liver disease with ascites: Daughter reports patient has not a drink in more than a year Continue Lasix,  lactulose and Aldactone INR 1.8, no signs of any bleeding S/p therapeutic paracentesis at [facility] on 07/19/2022 with removal of 8 L. He goes to [facility] week for paracentesis  Abdominal distention: Likely related to cirrhosis and ascites. He does not require paracentesis at this time. Patient was given ceftriaxone x1 for SBP.   History of EtOH use: Continue CIWA protocol to avoid withdrawal.  Essential hypertension Continue Coreg 6.25 mg p.o. twice daily:   DVT prophylaxis: SCDs Code Status: Full code Family Communication: Daughter at bedside Disposition Plan:  Status is: Inpatient Remains inpatient appropriate because: Admitted for hepatic encephalopathy likely due to constipation and noncompliant with medications.  Patient is improving with NG tube insertion with lactulose having bowel movements.   Anticipated discharge home in 1 to 2 days.  Consultants:  None  Procedures: None Antimicrobials: Anti-infectives (From admission, onward)    Start     Dose/Rate Route Frequency Ordered Stop   07/24/22 1530  cefTRIAXone (ROCEPHIN) 2 g in sodium chloride 0.9 % 100 mL IVPB        2 g 200 mL/hr over 30 Minutes Intravenous  Once 07/24/22 1519 07/24/22 1745       Subjective: Patient was seen and examined at bedside.  Overnight events noted. Daughter at bedside which helped in the translation.  Daughter reports patient is much improved since he came in.  Patient is alert and awake x 2.  Objective: Vitals:  07/24/22 1939 07/24/22 2200 07/25/22 0502 07/25/22 1003  BP: (!) 153/85  (!) 160/85 122/80  Pulse: 74  75 78  Resp: 16  18 18   Temp: (!) 95.4 F (35.2 C) 97.8 F (36.6 C) 97.8 F (36.6 C) (!) 97.5 F (36.4 C)  TempSrc:   Oral   SpO2: 100%  100% 100%   Weight:        Intake/Output Summary (Last 24 hours) at 07/25/2022 1108 Last data filed at 07/25/2022 1014 Gross per 24 hour  Intake 2536.62 ml  Output 150 ml  Net 2386.62 ml   Filed Weights   07/24/22 1825  Weight: 80 kg    Examination:  General exam: Appears comfortable, sick looking, not in any distress.  NG tube noted Respiratory system: CTA bilaterally, respiratory effort normal, RR 15 Cardiovascular system: S1 & S2 heard, regular rate and rhythm, no murmur. Gastrointestinal system: Abdomen is soft, mildly distended, nontender, BS+ Central nervous system: Alert and oriented x 2. No focal neurological deficits. Extremities: No edema, no cyanosis, no clubbing Skin: No rashes, lesions or ulcers Psychiatry:  Mood & affect appropriate.     Data Reviewed: I have personally reviewed following labs and imaging studies  CBC: Recent Labs  Lab 07/24/22 1250  WBC 3.7*  NEUTROABS 2.3  HGB 9.6*  HCT 29.1*  MCV 100.7*  PLT [id number]*   Basic Metabolic Panel: Recent Labs  Lab 07/24/22 1250  NA 140  K 3.5  CL 109  CO2 21*  GLUCOSE 137*  BUN 25*  CREATININE 1.28*  CALCIUM 8.0*  MG 1.9   GFR: Estimated Creatinine Clearance: 66.9 mL/min (A) (by C-G formula based on SCr of 1.28 mg/dL (H)). Liver Function Tests: Recent Labs  Lab 07/24/22 1250  AST 39  ALT 17  ALKPHOS 112  BILITOT 1.7*  PROT 6.2*  ALBUMIN 2.8*   No results for input(s): "LIPASE", "AMYLASE" in the last 168 hours. Recent Labs  Lab 07/24/22 1249  AMMONIA 104*   Coagulation Profile: Recent Labs  Lab 07/24/22 1345  INR 1.8*   Cardiac Enzymes: No results for input(s): "CKTOTAL", "CKMB", "CKMBINDEX", "TROPONINI" in the last 168 hours. BNP (last 3 results) No results for input(s): "PROBNP" in the last 8760 hours. HbA1C: Recent Labs    07/24/22 1250  HGBA1C 4.8   CBG: No results for input(s): "GLUCAP" in the last 168 hours. Lipid Profile: No results for input(s): "CHOL", "HDL", "LDLCALC",  "TRIG", "CHOLHDL", "LDLDIRECT" in the last 72 hours. Thyroid Function Tests: Recent Labs    07/24/22 1250  TSH 2.062   Anemia Panel: No results for input(s): "VITAMINB12", "FOLATE", "FERRITIN", "TIBC", "IRON", "RETICCTPCT" in the last 72 hours. Sepsis Labs: No results for input(s): "PROCALCITON", "LATICACIDVEN" in the last 168 hours.  No results found for this or any previous visit (from the past 240 hour(s)).       Radiology Studies: DG Abd Portable 1V  Result Date: 07/25/2022 CLINICAL DATA:  NG placement. EXAM: PORTABLE ABDOMEN - 1 VIEW COMPARISON:  CT abdomen pelvis dated 07/24/2022. FINDINGS: Partially visualized enteric tube with tip and side port in the gastric area in the proximal stomach. Recommend further advancing the tube by additional 5 cm. Mildly dilated small bowel. Moderate amount of stool throughout the colon. No acute osseous pathology. IMPRESSION: Enteric tube with tip and side port in the proximal stomach. Recommend further advancing the tube by additional 5 cm. Electronically Signed   By: Anner Crete M.D.   On: 07/25/2022 00:37   CT ABDOMEN  PELVIS WO CONTRAST  Result Date: 07/24/2022 CLINICAL DATA:  Altered mental status. EXAM: CT ABDOMEN AND PELVIS WITHOUT CONTRAST TECHNIQUE: Multidetector CT imaging of the abdomen and pelvis was performed following the standard protocol without IV contrast. RADIATION DOSE REDUCTION: This exam was performed according to the departmental dose-optimization program which includes automated exposure control, adjustment of the mA and/or kV according to patient size and/or use of iterative reconstruction technique. COMPARISON:  December 18, 2021. FINDINGS: Lower chest: No acute abnormality. Hepatobiliary: Cholelithiasis is noted. No biliary dilatation is noted. Nodular hepatic margins are noted consistent with hepatic cirrhosis. Pancreas: Unremarkable. No pancreatic ductal dilatation or surrounding inflammatory changes. Spleen: Normal in  size without focal abnormality. Adrenals/Urinary Tract: Adrenal glands are unremarkable. Kidneys are normal, without renal calculi, focal lesion, or hydronephrosis. Bladder is unremarkable. Stomach/Bowel: Stomach is within normal limits. Appendix appears normal. No evidence of bowel wall thickening, distention, or inflammatory changes. Stool is noted throughout the colon. Vascular/Lymphatic: No significant vascular findings are present. No enlarged abdominal or pelvic lymph nodes. Reproductive: Prostate is unremarkable. Other: Moderate ascites is noted.  No hernia is noted. Musculoskeletal: No acute or significant osseous findings. IMPRESSION: Hepatic cirrhosis is again noted with moderate ascites. Cholelithiasis. Large amount of stool seen throughout the colon. Electronically Signed   By: Marijo Conception M.D.   On: 07/24/2022 16:05   DG Abdomen 1 View  Result Date: 07/24/2022 CLINICAL DATA:  NG tube placement EXAM: ABDOMEN - 1 VIEW COMPARISON:  03/27/2022 FINDINGS: Tip of NG tube is seen in the region of body of stomach. There is dilation of small-bowel loops in the visualized upper abdomen measuring up to 3.3 cm in diameter. Lower abdomen and pelvis are not included in the images. IMPRESSION: Tip of NG tube is seen in the stomach. There is dilation of small-bowel loops suggesting ileus or early small bowel obstruction Electronically Signed   By: Elmer Picker M.D.   On: 07/24/2022 14:57   DG Abdomen 1 View  Result Date: 07/24/2022 CLINICAL DATA:  NG tube placement EXAM: ABDOMEN - 1 VIEW COMPARISON:  None Available. FINDINGS: Esophagogastric tube with tip at the level of the gastroesophageal junction, side port above the diaphragm. Gas-filled although nondistended small bowel in the central abdomen. No obvious free air on single supine radiograph. IMPRESSION: 1. Esophagogastric tube with tip at the level of the gastroesophageal junction, side port above the diaphragm. Recommend advancement to ensure  subdiaphragmatic position. 2. Gas-filled although nondistended small bowel in the central abdomen. Electronically Signed   By: Delanna Ahmadi M.D.   On: 07/24/2022 14:54   CT HEAD WO CONTRAST (5MM)  Result Date: 07/24/2022 CLINICAL DATA:  Mental status changes. EXAM: CT HEAD WITHOUT CONTRAST TECHNIQUE: Contiguous axial images were obtained from the base of the skull through the vertex without intravenous contrast. RADIATION DOSE REDUCTION: This exam was performed according to the departmental dose-optimization program which includes automated exposure control, adjustment of the mA and/or kV according to patient size and/or use of iterative reconstruction technique. COMPARISON:  03/24/2022 FINDINGS: Brain: There is no evidence for acute hemorrhage, hydrocephalus, mass lesion, or abnormal extra-axial fluid collection. No definite CT evidence for acute infarction. Vascular: No hyperdense vessel or unexpected calcification. Skull: No evidence for fracture. No worrisome lytic or sclerotic lesion. Sinuses/Orbits: The visualized paranasal sinuses and mastoid air cells are clear. Stable appearance old right medial orbital wall blowout fracture comparing to 02/13/2022. Other: None. IMPRESSION: 1. No acute intracranial abnormality. 2. Old right medial orbital wall blowout fracture.  Electronically Signed   By: Kennith Center M.D.   On: 07/24/2022 13:33   DG Chest Portable 1 View  Result Date: 07/24/2022 CLINICAL DATA:  Shortness of breath EXAM: PORTABLE CHEST 1 VIEW COMPARISON:  Radiograph 03/27/2022 FINDINGS: Unchanged mildly enlarged cardiac silhouette. Low lung volumes with bibasilar subsegmental atelectasis. Mild interstitial opacities likely due to vascular crowding. No definite new airspace disease. No large pleural effusion. No pneumothorax. No acute osseous abnormality. IMPRESSION: Low lung volumes with bibasilar subsegmental atelectasis and vascular crowding. No definite new airspace disease. Electronically Signed    By: Caprice Renshaw M.D.   On: 07/24/2022 13:04     Scheduled Meds:  carvedilol  6.25 mg Oral BID   folic acid  1 mg Oral Daily   furosemide  40 mg Oral Daily   lactulose  20 g Per Tube TID   multivitamin with minerals  1 tablet Oral Daily   pantoprazole  40 mg Oral BID   spironolactone  100 mg Oral Daily   thiamine  100 mg Oral Daily   Or   thiamine  100 mg Intravenous Daily   Continuous Infusions:  lactated ringers 75 mL/hr at 07/25/22 0640     LOS: 1 day    Time spent: 50 mins    Jhordyn Hoopingarner, MD [facility]   If 7PM-7AM, please contact night-coverage

## 2022-07-25 NOTE — TOC CM/SW Note (Signed)
CSW acknowledges consult for [facility] resources. Per last RN assessment, patient AOx1. Will follow for improvement in mentation and offer resources when appropriate.  Charlynn Court, CSW [phone]

## 2022-07-26 ENCOUNTER — Inpatient Hospital Stay: Payer: Self-pay

## 2022-07-26 DIAGNOSIS — K7031 Alcoholic cirrhosis of liver with ascites: Secondary | ICD-10-CM

## 2022-07-26 LAB — PHOSPHORUS: Phosphorus: 3.5 mg/dL (ref 2.5–4.6)

## 2022-07-26 LAB — URINALYSIS, ROUTINE W REFLEX MICROSCOPIC
Bilirubin Urine: NEGATIVE
Glucose, UA: NEGATIVE mg/dL
Ketones, ur: NEGATIVE mg/dL
Nitrite: NEGATIVE
Protein, ur: NEGATIVE mg/dL
Specific Gravity, Urine: 1.017 (ref 1.005–1.030)
pH: 5 (ref 5.0–8.0)

## 2022-07-26 LAB — URINE DRUG SCREEN, QUALITATIVE (ARMC ONLY)
Amphetamines, Ur Screen: NOT DETECTED
Barbiturates, Ur Screen: NOT DETECTED
Benzodiazepine, Ur Scrn: POSITIVE — AB
Cannabinoid 50 Ng, Ur ~~LOC~~: NOT DETECTED
Cocaine Metabolite,Ur ~~LOC~~: NOT DETECTED
MDMA (Ecstasy)Ur Screen: NOT DETECTED
Methadone Scn, Ur: NOT DETECTED
Opiate, Ur Screen: NOT DETECTED
Phencyclidine (PCP) Ur S: NOT DETECTED
Tricyclic, Ur Screen: NOT DETECTED

## 2022-07-26 LAB — COMPREHENSIVE METABOLIC PANEL
ALT: 16 U/L (ref 0–44)
AST: 31 U/L (ref 15–41)
Albumin: 2.4 g/dL — ABNORMAL LOW (ref 3.5–5.0)
Alkaline Phosphatase: 70 U/L (ref 38–126)
Anion gap: 5 (ref 5–15)
BUN: 20 mg/dL (ref 6–20)
CO2: 20 mmol/L — ABNORMAL LOW (ref 22–32)
Calcium: 7.8 mg/dL — ABNORMAL LOW (ref 8.9–10.3)
Chloride: 116 mmol/L — ABNORMAL HIGH (ref 98–111)
Creatinine, Ser: 0.97 mg/dL (ref 0.61–1.24)
GFR, Estimated: >60 mL/min (ref >60)
Glucose, Bld: 107 mg/dL — ABNORMAL HIGH (ref 70–99)
Potassium: 3.6 mmol/L (ref 3.5–5.1)
Sodium: 141 mmol/L (ref 135–145)
Total Bilirubin: 2.7 mg/dL — ABNORMAL HIGH (ref 0.3–1.2)
Total Protein: 5.5 g/dL — ABNORMAL LOW (ref 6.5–8.1)

## 2022-07-26 LAB — GLUCOSE, PLEURAL OR PERITONEAL FLUID: Glucose, Fluid: 123 mg/dL

## 2022-07-26 LAB — CBC
HCT: 25.1 % — ABNORMAL LOW (ref 39.0–52.0)
Hemoglobin: 8.5 g/dL — ABNORMAL LOW (ref 13.0–17.0)
MCH: 33.2 pg (ref 26.0–34.0)
MCHC: 33.9 g/dL (ref 30.0–36.0)
MCV: 98 fL (ref 80.0–100.0)
Platelets: 103 10*3/uL — ABNORMAL LOW (ref 150–400)
RBC: 2.56 MIL/uL — ABNORMAL LOW (ref 4.22–5.81)
RDW: 13.9 % (ref 11.5–15.5)
WBC: 6.9 10*3/uL (ref 4.0–10.5)
nRBC: 0 % (ref 0.0–0.2)

## 2022-07-26 LAB — BODY FLUID CELL COUNT WITH DIFFERENTIAL
Eos, Fluid: 0 %
Lymphs, Fluid: 42 %
Monocyte-Macrophage-Serous Fluid: 51 %
Neutrophil Count, Fluid: 7 %
Total Nucleated Cell Count, Fluid: 65 cu mm

## 2022-07-26 LAB — PROTEIN, PLEURAL OR PERITONEAL FLUID: Total protein, fluid: <3 g/dL

## 2022-07-26 LAB — MAGNESIUM: Magnesium: 1.6 mg/dL — ABNORMAL LOW (ref 1.7–2.4)

## 2022-07-26 MED ORDER — MAGNESIUM SULFATE 2 GM/50ML IV SOLN
2.0000 g | Freq: Once | INTRAVENOUS | Status: AC
  Administered 2022-07-26: 2 g via INTRAVENOUS
  Filled 2022-07-26: qty 50

## 2022-07-26 MED ORDER — RIFAXIMIN 550 MG PO TABS
550.0000 mg | ORAL_TABLET | Freq: Two times a day (BID) | ORAL | Status: DC
  Administered 2022-07-26 – 2022-07-27 (×2): 550 mg via ORAL
  Filled 2022-07-26 (×3): qty 1

## 2022-07-26 NOTE — Procedures (Signed)
PROCEDURE SUMMARY:  Successful US guided diagnostic and therapeutic paracentesis from [facility].  Yielded 4.2 L of clear, yellow fluid.  No immediate complications.  Pt tolerated well.   Specimen was sent for labs.  EBL < 1 mL  Shon Hough, AGNP 07/26/2022 4:34 PM

## 2022-07-26 NOTE — Progress Notes (Signed)
PROGRESS NOTE    Ladarrian Giffen  [id number] DOB: 01-Feb-1966 DOA: 07/24/2022  PCP: Caryl Never, MD   Brief Narrative: This 56 years old male with PMH significant for liver cirrhosis, type 2 diabetes presented in the ED with altered mental status.  History is obtained from daughter who reports patient was in his usual state of health until yesterday.  Her daughter went to check on him and found him hallucinating and combative.  He does have similar episodes in the past when his ammonia level goes up.  EMS was called and patient was noted to be combative and with altered mentation.  He was given IM Versed and required sedation.  He has distended abdomen, NG tube was inserted in the ED.  Ammonia level found to be more than 100. Patient is admitted for hepatic encephalopathy, requiring IV diuretics and lactulose.GI is consulted.  Assessment & Plan:   Principal Problem:   Hepatic encephalopathy (HCC) Active Problems:   Abdominal distention   Alcoholic cirrhosis of liver with ascites (HCC)   Thrombocytopenia (HCC)   Type 2 diabetes mellitus, without long-term current use of insulin (HCC)   CKD (chronic kidney disease) stage 2, GFR 60-89 ml/min   Pancytopenia (HCC)   GERD (gastroesophageal reflux disease)  Hepatic encephalopathy: Patient presented with altered mentation and hallucinations. Ammonia level 100, did not have any bowel movement last couple of days PTA.  CT head: No acute intracranial abnormalities. Triggering factor could be constipation and noncompliance with lactulose. NG tube was inserted in the ED, Continue lactulose 20 ml q8hrs via NG tube. Patient seems improved, daughter at bedside reports improvement. Patient has two big bowel movements. GI consulted, added Xifaxan to help with encephalopathy.  GERD: Continue pantoprazole 40 mg daily.  Pancytopenia: Suspect secondary to cirrhotic liver disease and splenomegaly. Does not require any blood transfusion at  this time. Monitor Cbc daily, No any obvious visible bleeding. GI recommended no plans for EGD unless patient is actively bleeding.  CKD stage II: Serum creatinine at baseline.  Serum creatinine 1.2-1.6 @ baseline. Avoid nephrotoxic medications.   Type 2 diabetes: Carb modified diet, Hb A1c 4.8 Diet controlled.  Alcoholic liver disease with ascites: Daughter reports patient has not a drink in more than a year Continue Lasix,  lactulose and Aldactone INR 1.8, No signs of any bleeding S/p therapeutic paracentesis at [facility] on 07/19/2022 with removal of 8 L. He goes to [facility] every week for paracentesis.  Abdominal distention: Likely related to cirrhosis and ascites. Patient was given ceftriaxone x1 for SBP. IR consulted for possible paracentesis.  History of EtOH use: Continue CIWA protocol to avoid withdrawal.  Essential hypertension Continue Coreg 6.25 mg p.o. twice daily:  Hypomagnesemia: Replaced, Continue to monitor.  DVT prophylaxis: SCDs Code Status: Full code Family Communication: Daughter at bedside Disposition Plan:  Status is: Inpatient Remains inpatient appropriate because: Admitted for hepatic encephalopathy likely due to constipation and noncompliance with medications.  Patient is improving with NG tube insertion with lactulose having bowel movements.   Anticipated discharge home in 1 to 2 days.  Consultants:  Gastroenterology IR  Procedures: None Antimicrobials: Anti-infectives (From admission, onward)    Start     Dose/Rate Route Frequency Ordered Stop   07/26/22 1800  rifaximin (XIFAXAN) tablet 550 mg        550 mg Oral 2 times daily 07/26/22 1026     07/24/22 1530  cefTRIAXone (ROCEPHIN) 2 g in sodium chloride 0.9 % 100 mL IVPB  2 g 200 mL/hr over 30 Minutes Intravenous  Once 07/24/22 1519 07/24/22 1745       Subjective: Patient was seen and examined at bedside.  Overnight events noted. RN reports patient has 2 big bowel movement last  night, no diarrhea. He feels much improved, lying comfortably on bed.  Objective: Vitals:   07/25/22 1550 07/25/22 1941 07/26/22 0418 07/26/22 0750  BP: (!) 153/91 139/83 113/76 125/78  Pulse: 76 74 76 78  Resp: 20 17 20 18   Temp: (!) 97.4 F (36.3 C) (!) 97.3 F (36.3 C) 98.7 F (37.1 C) 98.3 F (36.8 C)  TempSrc: Axillary Axillary Axillary Oral  SpO2: 100% 100% 100% 100%  Weight:        Intake/Output Summary (Last 24 hours) at 07/26/2022 1138 Last data filed at 07/26/2022 1000 Gross per 24 hour  Intake 1320 ml  Output 1300 ml  Net 20 ml   Filed Weights   07/24/22 1825  Weight: 80 kg    Examination:  General exam: Appears comfortable, sick looking, not in any distress, NG tube noted. Respiratory system: CTA bilaterally, respiratory effort normal, RR 15. Cardiovascular system: S1 & S2 heard, regular rate and rhythm, no murmur. Gastrointestinal system: Abdomen is soft, mildly distended, non tender, BS+. Central nervous system: Alert and oriented x 2. No focal neurological deficits. Extremities: No edema, no cyanosis, no clubbing Skin: No rashes, lesions or ulcers Psychiatry:  Mood & affect appropriate.     Data Reviewed: I have personally reviewed following labs and imaging studies  CBC: Recent Labs  Lab 07/24/22 1250 07/26/22 0449  WBC 3.7* 6.9  NEUTROABS 2.3  --   HGB 9.6* 8.5*  HCT 29.1* 25.1*  MCV 100.7* 98.0  PLT 124* [id number]*   Basic Metabolic Panel: Recent Labs  Lab 07/24/22 1250 07/26/22 0449  NA 140 141  K 3.5 3.6  CL 109 116*  CO2 21* 20*  GLUCOSE 137* 107*  BUN 25* 20  CREATININE 1.28* 0.97  CALCIUM 8.0* 7.8*  MG 1.9 1.6*  PHOS  --  3.5   GFR: Estimated Creatinine Clearance: 88.3 mL/min (by C-G formula based on SCr of 0.97 mg/dL). Liver Function Tests: Recent Labs  Lab 07/24/22 1250 07/26/22 0449  AST 39 31  ALT 17 16  ALKPHOS 112 70  BILITOT 1.7* 2.7*  PROT 6.2* 5.5*  ALBUMIN 2.8* 2.4*   No results for input(s): "LIPASE",  "AMYLASE" in the last 168 hours. Recent Labs  Lab 07/24/22 1249 07/25/22 1140  AMMONIA 104* 117*   Coagulation Profile: Recent Labs  Lab 07/24/22 1345  INR 1.8*   Cardiac Enzymes: No results for input(s): "CKTOTAL", "CKMB", "CKMBINDEX", "TROPONINI" in the last 168 hours. BNP (last 3 results) No results for input(s): "PROBNP" in the last 8760 hours. HbA1C: Recent Labs    07/24/22 1250  HGBA1C 4.8   CBG: No results for input(s): "GLUCAP" in the last 168 hours. Lipid Profile: No results for input(s): "CHOL", "HDL", "LDLCALC", "TRIG", "CHOLHDL", "LDLDIRECT" in the last 72 hours. Thyroid Function Tests: Recent Labs    07/24/22 1250  TSH 2.062   Anemia Panel: No results for input(s): "VITAMINB12", "FOLATE", "FERRITIN", "TIBC", "IRON", "RETICCTPCT" in the last 72 hours. Sepsis Labs: No results for input(s): "PROCALCITON", "LATICACIDVEN" in the last 168 hours.  No results found for this or any previous visit (from the past 240 hour(s)).       Radiology Studies: DG Abd Portable 1V  Result Date: 07/25/2022 CLINICAL DATA:  NG placement. EXAM: PORTABLE  ABDOMEN - 1 VIEW COMPARISON:  CT abdomen pelvis dated 07/24/2022. FINDINGS: Partially visualized enteric tube with tip and side port in the gastric area in the proximal stomach. Recommend further advancing the tube by additional 5 cm. Mildly dilated small bowel. Moderate amount of stool throughout the colon. No acute osseous pathology. IMPRESSION: Enteric tube with tip and side port in the proximal stomach. Recommend further advancing the tube by additional 5 cm. Electronically Signed   By: Elgie Collard M.D.   On: 07/25/2022 00:37   CT ABDOMEN PELVIS WO CONTRAST  Result Date: 07/24/2022 CLINICAL DATA:  Altered mental status. EXAM: CT ABDOMEN AND PELVIS WITHOUT CONTRAST TECHNIQUE: Multidetector CT imaging of the abdomen and pelvis was performed following the standard protocol without IV contrast. RADIATION DOSE REDUCTION: This  exam was performed according to the departmental dose-optimization program which includes automated exposure control, adjustment of the mA and/or kV according to patient size and/or use of iterative reconstruction technique. COMPARISON:  December 18, 2021. FINDINGS: Lower chest: No acute abnormality. Hepatobiliary: Cholelithiasis is noted. No biliary dilatation is noted. Nodular hepatic margins are noted consistent with hepatic cirrhosis. Pancreas: Unremarkable. No pancreatic ductal dilatation or surrounding inflammatory changes. Spleen: Normal in size without focal abnormality. Adrenals/Urinary Tract: Adrenal glands are unremarkable. Kidneys are normal, without renal calculi, focal lesion, or hydronephrosis. Bladder is unremarkable. Stomach/Bowel: Stomach is within normal limits. Appendix appears normal. No evidence of bowel wall thickening, distention, or inflammatory changes. Stool is noted throughout the colon. Vascular/Lymphatic: No significant vascular findings are present. No enlarged abdominal or pelvic lymph nodes. Reproductive: Prostate is unremarkable. Other: Moderate ascites is noted.  No hernia is noted. Musculoskeletal: No acute or significant osseous findings. IMPRESSION: Hepatic cirrhosis is again noted with moderate ascites. Cholelithiasis. Large amount of stool seen throughout the colon. Electronically Signed   By: Lupita Raider M.D.   On: 07/24/2022 16:05   DG Abdomen 1 View  Result Date: 07/24/2022 CLINICAL DATA:  NG tube placement EXAM: ABDOMEN - 1 VIEW COMPARISON:  03/27/2022 FINDINGS: Tip of NG tube is seen in the region of body of stomach. There is dilation of small-bowel loops in the visualized upper abdomen measuring up to 3.3 cm in diameter. Lower abdomen and pelvis are not included in the images. IMPRESSION: Tip of NG tube is seen in the stomach. There is dilation of small-bowel loops suggesting ileus or early small bowel obstruction Electronically Signed   By: Ernie Avena  M.D.   On: 07/24/2022 14:57   DG Abdomen 1 View  Result Date: 07/24/2022 CLINICAL DATA:  NG tube placement EXAM: ABDOMEN - 1 VIEW COMPARISON:  None Available. FINDINGS: Esophagogastric tube with tip at the level of the gastroesophageal junction, side port above the diaphragm. Gas-filled although nondistended small bowel in the central abdomen. No obvious free air on single supine radiograph. IMPRESSION: 1. Esophagogastric tube with tip at the level of the gastroesophageal junction, side port above the diaphragm. Recommend advancement to ensure subdiaphragmatic position. 2. Gas-filled although nondistended small bowel in the central abdomen. Electronically Signed   By: Jearld Lesch M.D.   On: 07/24/2022 14:54   CT HEAD WO CONTRAST ( )  Result Date: 07/24/2022 CLINICAL DATA:  Mental status changes. EXAM: CT HEAD WITHOUT CONTRAST TECHNIQUE: Contiguous axial images were obtained from the base of the skull through the vertex without intravenous contrast. RADIATION DOSE REDUCTION: This exam was performed according to the departmental dose-optimization program which includes automated exposure control, adjustment of the mA and/or kV according to  patient size and/or use of iterative reconstruction technique. COMPARISON:  03/24/2022 FINDINGS: Brain: There is no evidence for acute hemorrhage, hydrocephalus, mass lesion, or abnormal extra-axial fluid collection. No definite CT evidence for acute infarction. Vascular: No hyperdense vessel or unexpected calcification. Skull: No evidence for fracture. No worrisome lytic or sclerotic lesion. Sinuses/Orbits: The visualized paranasal sinuses and mastoid air cells are clear. Stable appearance old right medial orbital wall blowout fracture comparing to 02/13/2022. Other: None. IMPRESSION: 1. No acute intracranial abnormality. 2. Old right medial orbital wall blowout fracture. Electronically Signed   By: Kennith Center M.D.   On: 07/24/2022 13:33   DG Chest Portable 1  View  Result Date: 07/24/2022 CLINICAL DATA:  Shortness of breath EXAM: PORTABLE CHEST 1 VIEW COMPARISON:  Radiograph 03/27/2022 FINDINGS: Unchanged mildly enlarged cardiac silhouette. Low lung volumes with bibasilar subsegmental atelectasis. Mild interstitial opacities likely due to vascular crowding. No definite new airspace disease. No large pleural effusion. No pneumothorax. No acute osseous abnormality. IMPRESSION: Low lung volumes with bibasilar subsegmental atelectasis and vascular crowding. No definite new airspace disease. Electronically Signed   By: Caprice Renshaw M.D.   On: 07/24/2022 13:04     Scheduled Meds:  carvedilol  6.25 mg Oral BID   folic acid  1 mg Oral Daily   furosemide  40 mg Oral Daily   lactulose  20 g Per Tube TID   multivitamin with minerals  1 tablet Oral Daily   pantoprazole  40 mg Oral BID   rifaximin  550 mg Oral BID   spironolactone  100 mg Oral Daily   thiamine  100 mg Oral Daily   Or   thiamine  100 mg Intravenous Daily   Continuous Infusions:  lactated ringers 75 mL/hr at 07/25/22 0640     LOS: 2 days    Time spent: 35 mins    Negar Sieler, MD [facility]   If 7PM-7AM, please contact night-coverage

## 2022-07-26 NOTE — Consult Note (Signed)
Shawn Lame, MD [facility]  [address] [place], [place] [address] Phone: [phone] Fax : [phone]  Consultation  Referring Provider:     No ref. provider found Primary Care Physician:  Shawn Never, MD Primary Gastroenterologist:  Dr. Virgina Torres         Reason for Consultation:     Cirrhosis with hepatic encephalopathy  Date of Admission:  07/24/2022 Date of Consultation:  07/26/2022         HPI:   Shawn Torres is Torres 56 y.o. male who has Torres history of alcoholic cirrhosis and diabetes mellitus who is followed by Dr. Virgina Torres and was brought to the ER due to Torres change in mental status.  He was reported to have hallucinations.  He was also reported to be combative with EMS.  He had Torres NG tube placed and given lactulose.  The patient has been having soft bowel movements without any overt diarrhea.  Reported that the patient had stopped drinking.  The patient was being seen at [facility] for frequent paracentesis due to his refractory ascites.  Patient's upper endoscopy back in March of this year showed grade 2 esophageal varices without any sign of bleeding.  The patient's hemoglobin trends have shown:  Component     Latest Ref Rng 03/25/2022 03/26/2022 03/27/2022 03/28/2022 03/29/2022  Hemoglobin     13.0 - 17.0 g/dL 8.2 (L)  8.5 (L)  8.8 (L)  8.3 (L)  8.6 (L)   HCT     39.0 - 52.0 % 24.3 (L)  25.4 (L)  26.0 (L)  24.1 (L)  25.5 (L)    Component     Latest Ref Rng 05/27/2022 07/24/2022 07/26/2022  Hemoglobin     13.0 - 17.0 g/dL 9.0 (L)  9.6 (L)  8.5 (L)   HCT     39.0 - 52.0 % 24.5 (L)  29.1 (L)  25.1 (L)    The patient also had Torres CT scan of the abdomen showed large amount of stool seen throughout the colon with cholelithiasis and hepatic cirrhosis with moderate ascites.  Paracentesis was attempted in the ER but was not successful due to the patient being combative and moving around.  The caregivers have reported the patient's stools to have been brown and soft.  The patient's INR was noted  at 1.8 two days ago with it being 2.37four months ago.  Past Medical History:  Diagnosis Date   Ascites    Cirrhosis, alcoholic ([place])    Diabetes mellitus without complication ([place])    Memory loss     Past Surgical History:  Procedure Laterality Date   ESOPHAGOGASTRODUODENOSCOPY N/Torres 10/10/2021   Procedure: ESOPHAGOGASTRODUODENOSCOPY (EGD);  Surgeon: Shawn Lame, MD;  Location: [facility] ENDOSCOPY;  Service: Endoscopy;  Laterality: N/Torres;   ESOPHAGOGASTRODUODENOSCOPY N/Torres 12/27/2021   Procedure: ESOPHAGOGASTRODUODENOSCOPY (EGD);  Surgeon: Shawn Helling, DO;  Location: [facility] ENDOSCOPY;  Service: Gastroenterology;  Laterality: N/Torres;  Spanish Interpreter   ESOPHAGOGASTRODUODENOSCOPY N/Torres 01/20/2022   Procedure: ESOPHAGOGASTRODUODENOSCOPY (EGD);  Surgeon: Shawn Helling, DO;  Location: [facility] ENDOSCOPY;  Service: Gastroenterology;  Laterality: N/Torres;  Spanish Interpreter   ESOPHAGOGASTRODUODENOSCOPY N/Torres 02/03/2022   Procedure: ESOPHAGOGASTRODUODENOSCOPY (EGD);  Surgeon: Shawn Helling, DO;  Location: [facility] ENDOSCOPY;  Service: Gastroenterology;  Laterality: N/Torres;  Needs Spanish Interpreter - (prefers) all calls to daughter (she speaks [place])   ESOPHAGOGASTRODUODENOSCOPY (EGD) WITH PROPOFOL N/Torres 12/11/2021   Procedure: ESOPHAGOGASTRODUODENOSCOPY (EGD) WITH PROPOFOL;  Surgeon: Shawn Helling, DO;  Location: [facility];  Service: Gastroenterology;  Laterality: N/Torres;   IR PARACENTESIS  02/15/2022    Prior to Admission medications   Medication Sig Start Date End Date Taking? Authorizing Provider  tamsulosin (FLOMAX) 0.4 MG CAPS capsule Take 1 capsule (0.4 mg total) by mouth daily. 10/17/21  Yes Leeroy Bock, MD  carvedilol (COREG) 6.25 MG tablet Take 6.25 mg by mouth 2 (two) times daily. Patient not taking: Reported on 07/25/2022 03/10/22   [provider]  feeding supplement (ENSURE ENLIVE / ENSURE PLUS) LIQD Take 237 mLs by mouth 3 (three) times daily between  meals. Patient not taking: Reported on 07/25/2022 11/03/21   Shawn Grandchild A, DO  folic acid (FOLVITE) 1 MG tablet Take 1 tablet by mouth daily. Patient not taking: Reported on 07/25/2022 09/03/21 09/03/22  [provider]  furosemide (LASIX) 40 MG tablet Take 1 tablet (40 mg total) by mouth daily. Patient not taking: Reported on 07/25/2022 03/29/22   Charise Killian, MD  lactulose [facility]) 10 GM/15ML solution Take 30 mLs by mouth in the morning, at noon, in the evening, and at bedtime. Patient not taking: Reported on 07/25/2022 05/13/22 05/13/23  [provider]  pantoprazole (PROTONIX) 40 MG tablet Take 1 tablet (40 mg total) by mouth 2 (two) times daily. Patient not taking: Reported on 07/25/2022 12/14/21   Shawn Shadow, MD  spironolactone (ALDACTONE) 100 MG tablet Take 1 tablet (100 mg total) by mouth daily. Patient not taking: Reported on 03/24/2022 11/04/21   Shawn Banter, DO    Family History  Family history unknown: Yes     Social History   Tobacco Use   Smoking status: Torres   Smokeless tobacco: Torres  Vaping Use   Vaping Use: Torres used  Substance Use Topics   Alcohol use: Not Currently    Comment: SINCE JUNE 2022   Drug use: Torres    Allergies as of 07/24/2022 - Review Complete 07/24/2022  Allergen Reaction Noted   Albumin gc Rash 12/24/2021    Review of Systems:    All systems reviewed and negative except where noted in HPI.   Physical Exam:  Vital signs in last 24 hours: Temp:  [97.3 F (36.3 C)-98.7 F (37.1 C)] 98.3 F (36.8 C) (09/05 0750) Pulse Rate:  [74-78] 78 (09/05 0750) Resp:  [17-20] 18 (09/05 0750) BP: (113-153)/(76-91) 125/78 (09/05 0750) SpO2:  [100 %] 100 % (09/05 0750) Last BM Date : 07/26/22 General:   Lethargic and in NAD Head:  Normocephalic and atraumatic. Eyes:   No icterus.   Conjunctiva yellow. PERRLA. Ears:  Normal auditory acuity. Neck:  Supple; no masses or thyroidomegaly Lungs: Respirations even and  unlabored. Lungs clear to auscultation bilaterally.   No wheezes, crackles, or rhonchi.  Heart:  Regular rate and rhythm;  Without murmur, clicks, rubs or gallops Abdomen:  Soft, nondistended, nontender. Normal bowel sounds. No appreciable masses or hepatomegaly.  No rebound or guarding.  Rectal:  Not performed. Msk:  Symmetrical without gross deformities.    Extremities:  Without edema, cyanosis or clubbing. Neurologic: Lethargic. Skin:  Intact without significant lesions or rashes. Cervical Nodes:  No significant cervical adenopathy.  LAB RESULTS: Recent Labs    07/24/22 1250 07/26/22 0449  WBC 3.7* 6.9  HGB 9.6* 8.5*  HCT 29.1* 25.1*  PLT 124* 103*   BMET Recent Labs    07/24/22 1250 07/26/22 0449  NA 140 141  K 3.5 3.6  CL 109 116*  CO2 21* 20*  GLUCOSE 137* 107*  BUN  25* 20  CREATININE 1.28* 0.97  CALCIUM 8.0* 7.8*   LFT Recent Labs    07/26/22 0449  PROT 5.5*  ALBUMIN 2.4*  AST 31  ALT 16  ALKPHOS 70  BILITOT 2.7*   PT/INR Recent Labs    07/24/22 1345  LABPROT 20.9*  INR 1.8*    STUDIES: DG Abd Portable 1V  Result Date: 07/25/2022 CLINICAL DATA:  NG placement. EXAM: PORTABLE ABDOMEN - 1 VIEW COMPARISON:  CT abdomen pelvis dated 07/24/2022. FINDINGS: Partially visualized enteric tube with tip and side port in the gastric area in the proximal stomach. Recommend further advancing the tube by additional 5 cm. Mildly dilated small bowel. Moderate amount of stool throughout the colon. No acute osseous pathology. IMPRESSION: Enteric tube with tip and side port in the proximal stomach. Recommend further advancing the tube by additional 5 cm. Electronically Signed   By: Anner Crete M.D.   On: 07/25/2022 00:37   CT ABDOMEN PELVIS WO CONTRAST  Result Date: 07/24/2022 CLINICAL DATA:  Altered mental status. EXAM: CT ABDOMEN AND PELVIS WITHOUT CONTRAST TECHNIQUE: Multidetector CT imaging of the abdomen and pelvis was performed following the standard protocol  without IV contrast. RADIATION DOSE REDUCTION: This exam was performed according to the departmental dose-optimization program which includes automated exposure control, adjustment of the mA and/or kV according to patient size and/or use of iterative reconstruction technique. COMPARISON:  December 18, 2021. FINDINGS: Lower chest: No acute abnormality. Hepatobiliary: Cholelithiasis is noted. No biliary dilatation is noted. Nodular hepatic margins are noted consistent with hepatic cirrhosis. Pancreas: Unremarkable. No pancreatic ductal dilatation or surrounding inflammatory changes. Spleen: Normal in size without focal abnormality. Adrenals/Urinary Tract: Adrenal glands are unremarkable. Kidneys are normal, without renal calculi, focal lesion, or hydronephrosis. Bladder is unremarkable. Stomach/Bowel: Stomach is within normal limits. Appendix appears normal. No evidence of bowel wall thickening, distention, or inflammatory changes. Stool is noted throughout the colon. Vascular/Lymphatic: No significant vascular findings are present. No enlarged abdominal or pelvic lymph nodes. Reproductive: Prostate is unremarkable. Other: Moderate ascites is noted.  No hernia is noted. Musculoskeletal: No acute or significant osseous findings. IMPRESSION: Hepatic cirrhosis is again noted with moderate ascites. Cholelithiasis. Large amount of stool seen throughout the colon. Electronically Signed   By: Marijo Conception M.D.   On: 07/24/2022 16:05   DG Abdomen 1 View  Result Date: 07/24/2022 CLINICAL DATA:  NG tube placement EXAM: ABDOMEN - 1 VIEW COMPARISON:  03/27/2022 FINDINGS: Tip of NG tube is seen in the region of body of stomach. There is dilation of small-bowel loops in the visualized upper abdomen measuring up to 3.3 cm in diameter. Lower abdomen and pelvis are not included in the images. IMPRESSION: Tip of NG tube is seen in the stomach. There is dilation of small-bowel loops suggesting ileus or early small bowel obstruction  Electronically Signed   By: Elmer Picker M.D.   On: 07/24/2022 14:57   DG Abdomen 1 View  Result Date: 07/24/2022 CLINICAL DATA:  NG tube placement EXAM: ABDOMEN - 1 VIEW COMPARISON:  None Available. FINDINGS: Esophagogastric tube with tip at the level of the gastroesophageal junction, side port above the diaphragm. Gas-filled although nondistended small bowel in the central abdomen. No obvious free air on single supine radiograph. IMPRESSION: 1. Esophagogastric tube with tip at the level of the gastroesophageal junction, side port above the diaphragm. Recommend advancement to ensure subdiaphragmatic position. 2. Gas-filled although nondistended small bowel in the central abdomen. Electronically Signed   By: Lanae Crumbly  Laqueta Carina M.D.   On: 07/24/2022 14:54   CT HEAD WO CONTRAST (5MM)  Result Date: 07/24/2022 CLINICAL DATA:  Mental status changes. EXAM: CT HEAD WITHOUT CONTRAST TECHNIQUE: Contiguous axial images were obtained from the base of the skull through the vertex without intravenous contrast. RADIATION DOSE REDUCTION: This exam was performed according to the departmental dose-optimization program which includes automated exposure control, adjustment of the mA and/or kV according to patient size and/or use of iterative reconstruction technique. COMPARISON:  03/24/2022 FINDINGS: Brain: There is no evidence for acute hemorrhage, hydrocephalus, mass lesion, or abnormal extra-axial fluid collection. No definite CT evidence for acute infarction. Vascular: No hyperdense vessel or unexpected calcification. Skull: No evidence for fracture. No worrisome lytic or sclerotic lesion. Sinuses/Orbits: The visualized paranasal sinuses and mastoid air cells are clear. Stable appearance old right medial orbital wall blowout fracture comparing to 02/13/2022. Other: None. IMPRESSION: 1. No acute intracranial abnormality. 2. Old right medial orbital wall blowout fracture. Electronically Signed   By: Misty Stanley M.D.   On:  07/24/2022 13:33   DG Chest Portable 1 View  Result Date: 07/24/2022 CLINICAL DATA:  Shortness of breath EXAM: PORTABLE CHEST 1 VIEW COMPARISON:  Radiograph 03/27/2022 FINDINGS: Unchanged mildly enlarged cardiac silhouette. Low lung volumes with bibasilar subsegmental atelectasis. Mild interstitial opacities likely due to vascular crowding. No definite new airspace disease. No large pleural effusion. No pneumothorax. No acute osseous abnormality. IMPRESSION: Low lung volumes with bibasilar subsegmental atelectasis and vascular crowding. No definite new airspace disease. Electronically Signed   By: Maurine Simmering M.D.   On: 07/24/2022 13:04      Impression / Plan:   Assessment: Principal Problem:   Hepatic encephalopathy (HCC) Active Problems:   Abdominal distention   Alcoholic cirrhosis of liver with ascites (HCC)   Thrombocytopenia (HCC)   Type 2 diabetes mellitus, without long-term current use of insulin (HCC)   CKD (chronic kidney disease) stage 2, GFR 60-89 ml/min   Pancytopenia (HCC)   GERD (gastroesophageal reflux disease)   Lynne Vallas is Torres 56 y.o. y/o male with Torres history of cirrhosis and being followed by Dr. Virgina Torres.  The patient was admitted with confusion and signs and symptoms consistent with hepatic encephalopathy.  The patient's ammonia was checked which is not Torres good indication nor is it needed for the diagnosis of hepatic encephalopathy and this diagnosis should be based on clinical presentation not ammonia levels.  Although the patient's hemoglobin is down from 2 days ago it is closer to his baseline over the last 3 to 4 months.  He has not had any sign of GI bleeding with only brown stools reported.  Possible inciting factors that may have caused his hepatic encephalopathy may be infection and I would consider Torres radiological guided paracentesis with Torres checking of the cell count.  Plan:  This patient is being seen for end-stage liver disease with cirrhosis and ascites and  decreasing hemoglobin which is near his baseline without any sign of overt GI bleeding.  The patient had an upper endoscopy in March by Dr. Virgina Torres.  I would not recommend repeating the EGD at this time unless he does show any further signs of bleeding or drop in hemoglobin.  I will also add Xifaxan to help treat his hepatic encephalopathy.  I would not recommend following ammonia levels since this is not indicative of his response to therapy is better evaluated with physical signs and symptoms.  Thank you for involving me in the care of this patient.  LOS: 2 days   Midge Minium, MD, [facility] 07/26/2022, 9:12 AM,  Pager [phone] 7am-5pm  Check AMION for 5pm -7am coverage and on weekends   Note: This dictation was prepared with Dragon dictation along with smaller phrase technology. Any transcriptional errors that result from this process are unintentional.

## 2022-07-27 ENCOUNTER — Other Ambulatory Visit: Payer: Self-pay

## 2022-07-27 LAB — FOLATE: Folate: 17.3 ng/mL (ref >5.9)

## 2022-07-27 LAB — COMPREHENSIVE METABOLIC PANEL
ALT: 14 U/L (ref 0–44)
AST: 27 U/L (ref 15–41)
Albumin: 2.1 g/dL — ABNORMAL LOW (ref 3.5–5.0)
Alkaline Phosphatase: 72 U/L (ref 38–126)
Anion gap: 4 — ABNORMAL LOW (ref 5–15)
BUN: 22 mg/dL — ABNORMAL HIGH (ref 6–20)
CO2: 20 mmol/L — ABNORMAL LOW (ref 22–32)
Calcium: 7.5 mg/dL — ABNORMAL LOW (ref 8.9–10.3)
Chloride: 116 mmol/L — ABNORMAL HIGH (ref 98–111)
Creatinine, Ser: 1.01 mg/dL (ref 0.61–1.24)
GFR, Estimated: >60 mL/min (ref >60)
Glucose, Bld: 81 mg/dL (ref 70–99)
Potassium: 3.5 mmol/L (ref 3.5–5.1)
Sodium: 140 mmol/L (ref 135–145)
Total Bilirubin: 2.4 mg/dL — ABNORMAL HIGH (ref 0.3–1.2)
Total Protein: 5 g/dL — ABNORMAL LOW (ref 6.5–8.1)

## 2022-07-27 LAB — RETICULOCYTES
Immature Retic Fract: 9.7 % (ref 2.3–15.9)
RBC.: 2.56 MIL/uL — ABNORMAL LOW (ref 4.22–5.81)
Retic Count, Absolute: 39.7 10*3/uL (ref 19.0–186.0)
Retic Ct Pct: 1.6 % (ref 0.4–3.1)

## 2022-07-27 LAB — IRON AND TIBC
Iron: 53 ug/dL (ref 45–182)
Saturation Ratios: 34 % (ref 17.9–39.5)
TIBC: 155 ug/dL — ABNORMAL LOW (ref 250–450)
UIBC: 102 ug/dL

## 2022-07-27 LAB — CBC
HCT: 24.5 % — ABNORMAL LOW (ref 39.0–52.0)
Hemoglobin: 8.1 g/dL — ABNORMAL LOW (ref 13.0–17.0)
MCH: 32.7 pg (ref 26.0–34.0)
MCHC: 33.1 g/dL (ref 30.0–36.0)
MCV: 98.8 fL (ref 80.0–100.0)
Platelets: 95 10*3/uL — ABNORMAL LOW (ref 150–400)
RBC: 2.48 MIL/uL — ABNORMAL LOW (ref 4.22–5.81)
RDW: 14 % (ref 11.5–15.5)
WBC: 7.8 10*3/uL (ref 4.0–10.5)
nRBC: 0 % (ref 0.0–0.2)

## 2022-07-27 LAB — PHOSPHORUS: Phosphorus: 3.3 mg/dL (ref 2.5–4.6)

## 2022-07-27 LAB — MAGNESIUM: Magnesium: 2 mg/dL (ref 1.7–2.4)

## 2022-07-27 LAB — FERRITIN: Ferritin: 56 ng/mL (ref 24–336)

## 2022-07-27 LAB — VITAMIN B12: Vitamin B-12: 1084 pg/mL — ABNORMAL HIGH (ref 180–914)

## 2022-07-27 MED ORDER — RIFAXIMIN 550 MG PO TABS
550.0000 mg | ORAL_TABLET | Freq: Two times a day (BID) | ORAL | 1 refills | Status: DC
  Filled 2022-07-27: qty 60, 30d supply, fill #0

## 2022-07-27 MED ORDER — TAMSULOSIN HCL 0.4 MG PO CAPS
0.4000 mg | ORAL_CAPSULE | Freq: Every day | ORAL | 1 refills | Status: DC
  Filled 2022-07-27: qty 22, 22d supply, fill #0

## 2022-07-27 MED ORDER — CARVEDILOL 6.25 MG PO TABS
6.2500 mg | ORAL_TABLET | Freq: Two times a day (BID) | ORAL | 1 refills | Status: DC
  Filled 2022-07-27: qty 60, 30d supply, fill #0

## 2022-07-27 MED ORDER — ADULT MULTIVITAMIN W/MINERALS CH
1.0000 | ORAL_TABLET | Freq: Every day | ORAL | 0 refills | Status: DC
  Filled 2022-07-27: qty 90, 90d supply, fill #0

## 2022-07-27 MED ORDER — FOLIC ACID 1 MG PO TABS
1.0000 mg | ORAL_TABLET | Freq: Every day | ORAL | 11 refills | Status: AC
  Filled 2022-07-27: qty 30, 30d supply, fill #0

## 2022-07-27 MED ORDER — PANTOPRAZOLE SODIUM 40 MG PO TBEC
40.0000 mg | DELAYED_RELEASE_TABLET | Freq: Two times a day (BID) | ORAL | 1 refills | Status: DC
  Filled 2022-07-27 (×2): qty 60, 30d supply, fill #0

## 2022-07-27 MED ORDER — LACTULOSE 10 GM/15ML PO SOLN
20.0000 g | Freq: Three times a day (TID) | ORAL | 2 refills | Status: AC
  Filled 2022-07-27: qty 946, 11d supply, fill #0

## 2022-07-27 MED ORDER — ENSURE ENLIVE PO LIQD: 237.0000 mL | Freq: Three times a day (TID) | ORAL | 12 refills | Status: DC

## 2022-07-27 MED ORDER — VITAMIN B-1 100 MG PO TABS
100.0000 mg | ORAL_TABLET | Freq: Every day | ORAL | 0 refills | Status: DC
  Filled 2022-07-27: qty 30, 30d supply, fill #0

## 2022-07-27 MED ORDER — SPIRONOLACTONE 100 MG PO TABS
100.0000 mg | ORAL_TABLET | Freq: Every day | ORAL | 2 refills | Status: DC
  Filled 2022-07-27: qty 30, 30d supply, fill #0

## 2022-07-27 MED ORDER — FUROSEMIDE 40 MG PO TABS
40.0000 mg | ORAL_TABLET | Freq: Every day | ORAL | 1 refills | Status: DC
  Filled 2022-07-27: qty 30, 30d supply, fill #0

## 2022-07-27 NOTE — Plan of Care (Signed)
Patient restraint order discontinued at 9/4 1:42 am

## 2022-07-27 NOTE — Progress Notes (Signed)
Spanish SA resources added to AVS per [facility] consult, no other discharge needs.  [place], [place] [phone]

## 2022-07-27 NOTE — Discharge Summary (Signed)
Physician Discharge Summary   Patient: Shawn Torres MRN: [id number] DOB: [birth year]  Admit date:     07/24/2022  Discharge date: 07/27/2022  Discharge Physician: Arnetha Courser   PCP: Kurtis Bushman, MD   Recommendations at discharge:  Please obtain CBC and CMP in 1 week Follow up with primary care provider Follow up with gastroenterologist  Discharge Diagnoses: Principal Problem:   Hepatic encephalopathy [facility]) Active Problems:   Abdominal distention   Alcoholic cirrhosis of liver with ascites (HCC)   Thrombocytopenia (HCC)   Type 2 diabetes mellitus, without long-term current use of insulin (HCC)   CKD (chronic kidney disease) stage 2, GFR 60-89 ml/min   Pancytopenia (HCC)   GERD (gastroesophageal reflux disease)   Hospital Course: 56 years old male with PMH significant for liver cirrhosis, type 2 diabetes presented in the ED with altered mental status.  History is obtained from daughter who reports patient was in his usual state of health until yesterday.  Her daughter went to check on him and found him hallucinating and combative.  He does have similar episodes in the past when his ammonia level goes up.  EMS was called and patient was noted to be combative and with altered mentation.  He was given IM Versed and required sedation.  He has distended abdomen, NG tube was inserted in the ED.  Ammonia level found to be more than 100.  Most likely secondary to constipation as he did not had any bowel movements for the past few days. Patient is admitted for hepatic encephalopathy, requiring IV diuretics and lactulose.GI is consulted. CT head was negative for any acute intracranial abnormalities.  NG tube was inserted for lactulose and patient started getting bowel movements with improvement in his mental status.  Patient also received paracentesis with removal of 4.5 L of fluid which resulted in significant improvement in his discomfort..  Patient has an history of frequent  paracentesis done at [facility].  Patient might get benefit from TIPS procedure.  Need to follow-up with his physicians at [facility] for further determination and management.  Gastroenterology also added rifaximin and he was advised to continue taking lactulose to have 2-3 soft bowel movements daily.  Per patient he has stopped drinking now for more than a year.  Patient remained stable and at baseline and is being discharged home to follow-up closely with his providers.    Assessment and Plan: * Hepatic encephalopathy (HCC) Restart lactulose and see how he responds Ordered Ativan per CIWA in case he becomes combative again.  GERD (gastroesophageal reflux disease) PPI  Pancytopenia (HCC) Suspect his now pancytopenia is related to cirrhotic liver disease and splenomegaly  CKD (chronic kidney disease) stage 2, GFR 60-89 ml/min Likely some degree of hepatorenal disease  Type 2 diabetes mellitus, without long-term current use of insulin (HCC) Carb modified diet  Thrombocytopenia (HCC) Likely related to liver disease  Alcoholic cirrhosis of liver with ascites (HCC) Apparently has not had a drink in > 1 year. Continue lasix and Lactulose and aldactone Has abnormal coagulation related to this with INR of 1.8. Holding DVT ppx. S/p therapeutic paracentesis at [facility] on 07/19/2022--with removal of 8 liters and appears to go weekly for similar.  Abdominal distention Likely related to cirrhosis and ascites, CT abdomen pending   Consultants: Gastro enterology Procedures performed: Paracentesis Disposition: Home Diet recommendation:  Discharge Diet Orders (From admission, onward)     Start     Ordered   07/27/22 0000  Diet - low sodium heart healthy  07/27/22 1105           Cardiac diet DISCHARGE MEDICATION: Allergies as of 07/27/2022       Reactions   Albumin Gc Rash   Patient gets rash with albumin. See media tab picture dated 12/24/21. Needs pre-meds w/ albumin (benadryl) to avoid  allergic reaction.         Medication List     TAKE these medications    carvedilol 6.25 MG tablet Commonly known as: COREG Take 1 tablet (6.25 mg total) by mouth 2 (two) times daily.   feeding supplement Liqd Take 237 mLs by mouth 3 (three) times daily between meals.   folic acid 1 MG tablet Commonly known as: FOLVITE Take 1 tablet (1 mg total) by mouth daily.   furosemide 40 MG tablet Commonly known as: LASIX Tome 1 tableta (40 mg en total) por va oral diariamente. (Take 1 tablet (40 mg total) by mouth daily.)   lactulose 10 GM/15ML solution Commonly known as: CHRONULAC Take 30 mLs (20 g total) by mouth 3 (three) times daily. What changed: when to take this   multivitamin with minerals Tabs tablet Take 1 tablet by mouth daily. Start taking on: July 28, 2022   pantoprazole 40 MG tablet Commonly known as: PROTONIX Take 1 tablet (40 mg total) by mouth 2 (two) times daily.   rifaximin 550 MG Tabs tablet Commonly known as: XIFAXAN Take 1 tablet (550 mg total) by mouth 2 (two) times daily.   spironolactone 100 MG tablet Commonly known as: ALDACTONE Take 1 tablet (100 mg total) by mouth daily.   tamsulosin 0.4 MG Caps capsule Commonly known as: FLOMAX Take 1 capsule (0.4 mg total) by mouth daily.   thiamine 100 MG tablet Commonly known as: Vitamin B-1 Take 1 tablet (100 mg total) by mouth daily. Start taking on: July 28, 2022        Follow-up Information     Gladman, Cheral Marker, MD. Schedule an appointment as soon as possible for a visit in 1 week(s).   Specialty: Internal Medicine Contact information: [address] [place] [address] [phone]                Discharge Exam: Ceasar Mons Weights   07/24/22 1825  Weight: 80 kg   General.  Well-developed gentleman, in no acute distress. Pulmonary.  Lungs clear bilaterally, normal respiratory effort. CV.  Regular rate and rhythm, no JVD, rub or murmur. Abdomen.  Soft,  nontender, mildly distended, BS positive. CNS.  Alert and oriented .  No focal neurologic deficit. Extremities.  No edema, no cyanosis, pulses intact and symmetrical. Psychiatry.  Judgment and insight appears normal.   Condition at discharge: stable  The results of significant diagnostics from this hospitalization (including imaging, microbiology, ancillary and laboratory) are listed below for reference.   Imaging Studies: US Paracentesis  Result Date: 07/27/2022 INDICATION: Patient with history of alcoholic cirrhosis with recurrent ascites. Request for diagnostic and therapeutic paracentesis. EXAM: ULTRASOUND GUIDED DIAGNOSTIC AND THERAPEUTIC RIGHT LOWER QUADRANT PARACENTESIS MEDICATIONS: 10 mL 1 % lidocaine, topical lidocaine COMPLICATIONS: None immediate. PROCEDURE: Informed written consent was obtained from the patient after a discussion of the risks, benefits and alternatives to treatment. A timeout was performed prior to the initiation of the procedure. Initial ultrasound scanning demonstrates a moderate amount of ascites within the right lower abdominal quadrant. The right lower abdomen was prepped and draped in the usual sterile fashion. 1% lidocaine was used for local anesthesia. Following this, a  19 gauge, 7-cm, Yueh catheter was introduced. An ultrasound image was saved for documentation purposes. The paracentesis was performed. The catheter was removed and a dressing was applied. The patient tolerated the procedure well without immediate post procedural complication. FINDINGS: A total of approximately 4.2 L of clear, yellow fluid was removed. Samples were sent to the laboratory as requested by the clinical team. IMPRESSION: Successful ultrasound-guided paracentesis yielding 4.2 liters of peritoneal fluid. Read by: Alex Gardener, AGNP-BC Electronically Signed   By: Olive Bass M.D.   On: 07/27/2022 07:08   DG Abd Portable 1V  Result Date: 07/25/2022 CLINICAL DATA:  NG placement. EXAM:  PORTABLE ABDOMEN - 1 VIEW COMPARISON:  CT abdomen pelvis dated 07/24/2022. FINDINGS: Partially visualized enteric tube with tip and side port in the gastric area in the proximal stomach. Recommend further advancing the tube by additional 5 cm. Mildly dilated small bowel. Moderate amount of stool throughout the colon. No acute osseous pathology. IMPRESSION: Enteric tube with tip and side port in the proximal stomach. Recommend further advancing the tube by additional 5 cm. Electronically Signed   By: Elgie Collard M.D.   On: 07/25/2022 00:37   CT ABDOMEN PELVIS WO CONTRAST  Result Date: 07/24/2022 CLINICAL DATA:  Altered mental status. EXAM: CT ABDOMEN AND PELVIS WITHOUT CONTRAST TECHNIQUE: Multidetector CT imaging of the abdomen and pelvis was performed following the standard protocol without IV contrast. RADIATION DOSE REDUCTION: This exam was performed according to the departmental dose-optimization program which includes automated exposure control, adjustment of the mA and/or kV according to patient size and/or use of iterative reconstruction technique. COMPARISON:  December 18, 2021. FINDINGS: Lower chest: No acute abnormality. Hepatobiliary: Cholelithiasis is noted. No biliary dilatation is noted. Nodular hepatic margins are noted consistent with hepatic cirrhosis. Pancreas: Unremarkable. No pancreatic ductal dilatation or surrounding inflammatory changes. Spleen: Normal in size without focal abnormality. Adrenals/Urinary Tract: Adrenal glands are unremarkable. Kidneys are normal, without renal calculi, focal lesion, or hydronephrosis. Bladder is unremarkable. Stomach/Bowel: Stomach is within normal limits. Appendix appears normal. No evidence of bowel wall thickening, distention, or inflammatory changes. Stool is noted throughout the colon. Vascular/Lymphatic: No significant vascular findings are present. No enlarged abdominal or pelvic lymph nodes. Reproductive: Prostate is unremarkable. Other: Moderate  ascites is noted.  No hernia is noted. Musculoskeletal: No acute or significant osseous findings. IMPRESSION: Hepatic cirrhosis is again noted with moderate ascites. Cholelithiasis. Large amount of stool seen throughout the colon. Electronically Signed   By: Lupita Raider M.D.   On: 07/24/2022 16:05   DG Abdomen 1 View  Result Date: 07/24/2022 CLINICAL DATA:  NG tube placement EXAM: ABDOMEN - 1 VIEW COMPARISON:  03/27/2022 FINDINGS: Tip of NG tube is seen in the region of body of stomach. There is dilation of small-bowel loops in the visualized upper abdomen measuring up to 3.3 cm in diameter. Lower abdomen and pelvis are not included in the images. IMPRESSION: Tip of NG tube is seen in the stomach. There is dilation of small-bowel loops suggesting ileus or early small bowel obstruction Electronically Signed   By: Ernie Avena M.D.   On: 07/24/2022 14:57   DG Abdomen 1 View  Result Date: 07/24/2022 CLINICAL DATA:  NG tube placement EXAM: ABDOMEN - 1 VIEW COMPARISON:  None Available. FINDINGS: Esophagogastric tube with tip at the level of the gastroesophageal junction, side port above the diaphragm. Gas-filled although nondistended small bowel in the central abdomen. No obvious free air on single supine radiograph. IMPRESSION: 1. Esophagogastric tube  with tip at the level of the gastroesophageal junction, side port above the diaphragm. Recommend advancement to ensure subdiaphragmatic position. 2. Gas-filled although nondistended small bowel in the central abdomen. Electronically Signed   By: Delanna Ahmadi M.D.   On: 07/24/2022 14:54   CT HEAD WO CONTRAST (5MM)  Result Date: 07/24/2022 CLINICAL DATA:  Mental status changes. EXAM: CT HEAD WITHOUT CONTRAST TECHNIQUE: Contiguous axial images were obtained from the base of the skull through the vertex without intravenous contrast. RADIATION DOSE REDUCTION: This exam was performed according to the departmental dose-optimization program which includes  automated exposure control, adjustment of the mA and/or kV according to patient size and/or use of iterative reconstruction technique. COMPARISON:  03/24/2022 FINDINGS: Brain: There is no evidence for acute hemorrhage, hydrocephalus, mass lesion, or abnormal extra-axial fluid collection. No definite CT evidence for acute infarction. Vascular: No hyperdense vessel or unexpected calcification. Skull: No evidence for fracture. No worrisome lytic or sclerotic lesion. Sinuses/Orbits: The visualized paranasal sinuses and mastoid air cells are clear. Stable appearance old right medial orbital wall blowout fracture comparing to 02/13/2022. Other: None. IMPRESSION: 1. No acute intracranial abnormality. 2. Old right medial orbital wall blowout fracture. Electronically Signed   By: Misty Stanley M.D.   On: 07/24/2022 13:33   DG Chest Portable 1 View  Result Date: 07/24/2022 CLINICAL DATA:  Shortness of breath EXAM: PORTABLE CHEST 1 VIEW COMPARISON:  Radiograph 03/27/2022 FINDINGS: Unchanged mildly enlarged cardiac silhouette. Low lung volumes with bibasilar subsegmental atelectasis. Mild interstitial opacities likely due to vascular crowding. No definite new airspace disease. No large pleural effusion. No pneumothorax. No acute osseous abnormality. IMPRESSION: Low lung volumes with bibasilar subsegmental atelectasis and vascular crowding. No definite new airspace disease. Electronically Signed   By: Maurine Simmering M.D.   On: 07/24/2022 13:04    Microbiology: Results for orders placed or performed during the hospital encounter of 07/24/22  Aerobic/Anaerobic Culture w Gram Stain (surgical/deep wound)     Status: None (Preliminary result)   Collection Time: 07/26/22  4:02 PM   Specimen: PATH Cytology Peritoneal fluid  Result Value Ref Range Status   Specimen Description   Final    PERITONEAL Performed at [facility], [address], [place], [place] [address]    Special Requests   Final    NONE Performed  at [facility], [address] [place], [place] [address]    Gram Stain NO WBC SEEN NO ORGANISMS SEEN   Final   Culture   Final    NO GROWTH < 12 HOURS Performed at [facility], [place] [address], [place], [place] [address]    Report Status PENDING  Incomplete    Labs: CBC: Recent Labs  Lab 07/24/22 1250 07/26/22 0449 07/27/22 0440  WBC 3.7* 6.9 7.8  NEUTROABS 2.3  --   --   HGB 9.6* 8.5* 8.1*  HCT 29.1* 25.1* 24.5*  MCV 100.7* 98.0 98.8  PLT 124* 103* 95*   Basic Metabolic Panel: Recent Labs  Lab 07/24/22 1250 07/26/22 0449 07/27/22 0440  NA 140 141 140  K 3.5 3.6 3.5  CL 109 116* 116*  CO2 21* 20* 20*  GLUCOSE 137* 107* 81  BUN 25* 20 22*  CREATININE 1.28* 0.97 1.01  CALCIUM 8.0* 7.8* 7.5*  MG 1.9 1.6* 2.0  PHOS  --  3.5 3.3   Liver Function Tests: Recent Labs  Lab 07/24/22 1250 07/26/22 0449 07/27/22 0440  AST 39 31 27  ALT 17 16 14  ALKPHOS 112 70 72  BILITOT 1.7* 2.7* 2.4*  PROT 6.2* 5.5* 5.0*  ALBUMIN 2.8* 2.4* 2.1*   CBG: No results for input(s): "GLUCAP" in the last 168 hours.  Discharge time spent: greater than 30 minutes.  This record has been created using Systems analyst. Errors have been sought and corrected,but may not always be located. Such creation errors do not reflect on the standard of care.   Signed: Lorella Nimrod, MD [facility] 07/27/2022

## 2022-07-27 NOTE — Progress Notes (Signed)
Discharge instructions given with daughter at bedside. No concerns voiced. Pt encouraged to stop by outpatient pharmacy and pick up meds that were e-prescribed by MD. Daughter voiced understanding. Pt left unit in wheelchair pushed by NT. Left in stable condition.

## 2022-07-28 LAB — PROTEIN, BODY FLUID (OTHER): Total Protein, Body Fluid Other: 1.1 g/dL

## 2022-07-28 LAB — ACID FAST SMEAR (AFB, MYCOBACTERIA): Acid Fast Smear: NEGATIVE

## 2022-07-28 LAB — TRIGLYCERIDES, BODY FLUIDS: Triglycerides, Fluid: 61 mg/dL

## 2022-07-28 LAB — CYTOLOGY - NON PAP

## 2022-07-31 LAB — AEROBIC/ANAEROBIC CULTURE W GRAM STAIN (SURGICAL/DEEP WOUND)
Culture: NO GROWTH
Gram Stain: NONE SEEN

## 2022-08-03 ENCOUNTER — Other Ambulatory Visit: Payer: Self-pay

## 2022-08-04 ENCOUNTER — Other Ambulatory Visit: Payer: Self-pay | Admitting: Pharmacy Technician

## 2022-08-04 ENCOUNTER — Other Ambulatory Visit: Payer: Self-pay

## 2022-08-04 NOTE — Patient Outreach (Signed)
Received printout to order Xifaxan through [facility]'s Patient Assistance Program.  Patient does not meet eligibility criteria.  Must be a legal [place] resident.  Pt is undocumented.  Also, patient is receiving care from a [facility] provider.  Contacted patient with the help of a Research officer, trade union, [employer] from [employer], [place] [id number].  Suggested patient reach out to [facility] provider to discuss how to obtain this medication.  Sherilyn Dacosta Patient Advocate Specialist [facility] at [facility]

## 2022-08-19 ENCOUNTER — Other Ambulatory Visit: Payer: Self-pay

## 2022-09-09 LAB — ACID FAST CULTURE WITH REFLEXED SENSITIVITIES (MYCOBACTERIA): Acid Fast Culture: NEGATIVE

## 2022-11-26 IMAGING — CT CT CERVICAL SPINE W/O CM
3 of 4 series · 11 of 33 positions shown, 13 images · non-contrast
Comparison: No prior cervical spine CT. Comparison is made with the
head CT of 12/30/2021.

CLINICAL DATA: Blunt polytrauma.



[Series 6: sagittal bone · sagittal · 0.27mm/px · 5 of 80 slices shown, 6 images]
[im 27/80  bone]
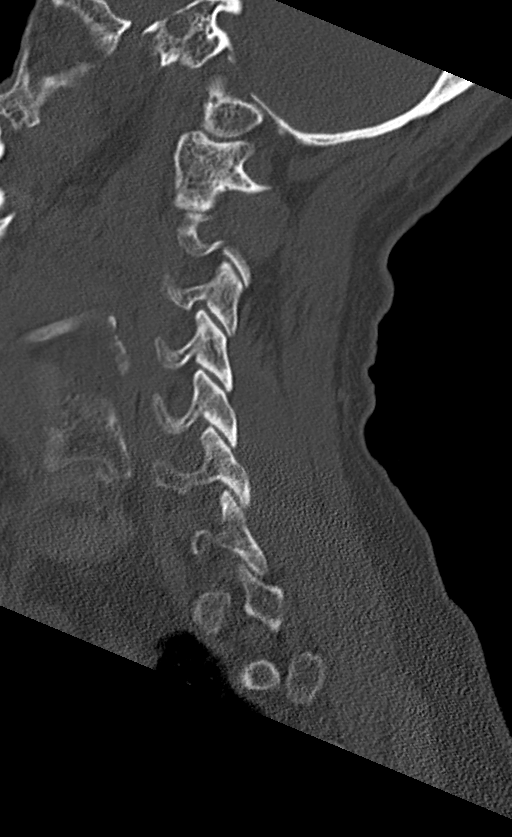
[im 33/80  bone]
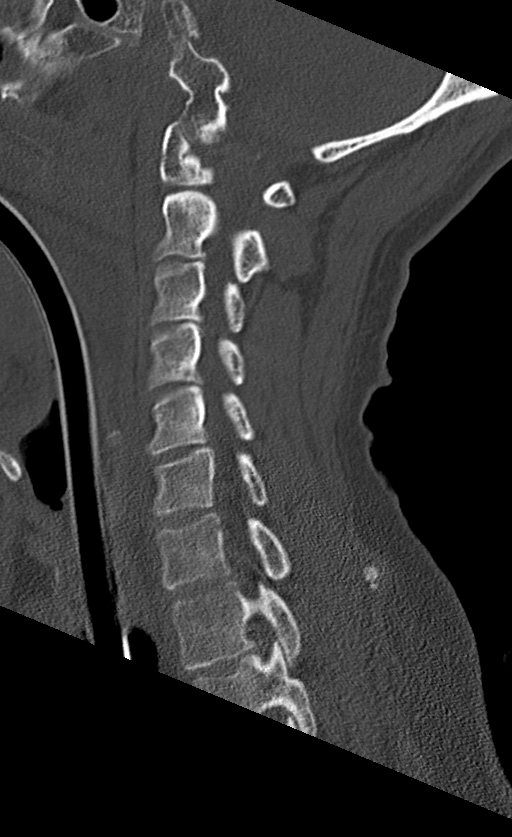
[im 40/80  soft-tissue]
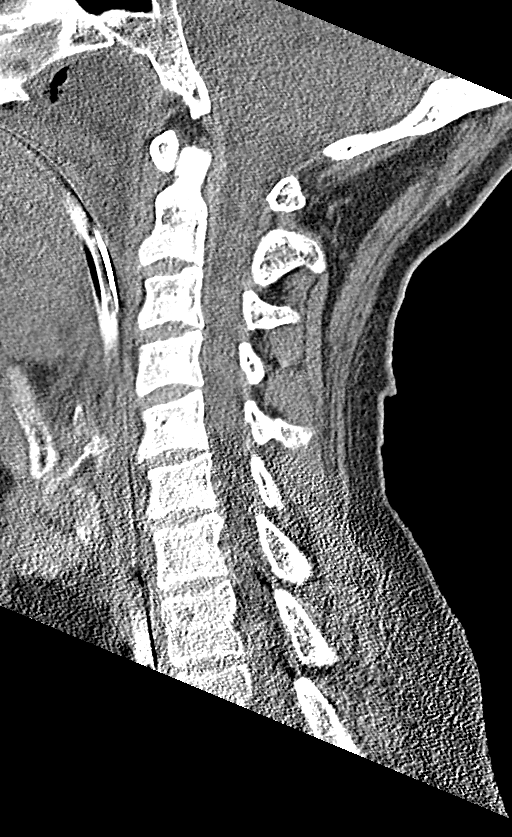
[im 40/80  bone]
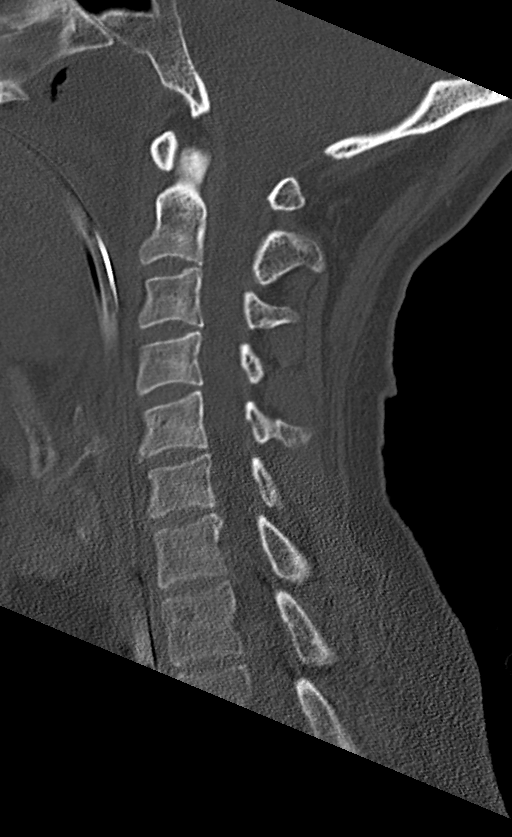
[im 47/80  bone]
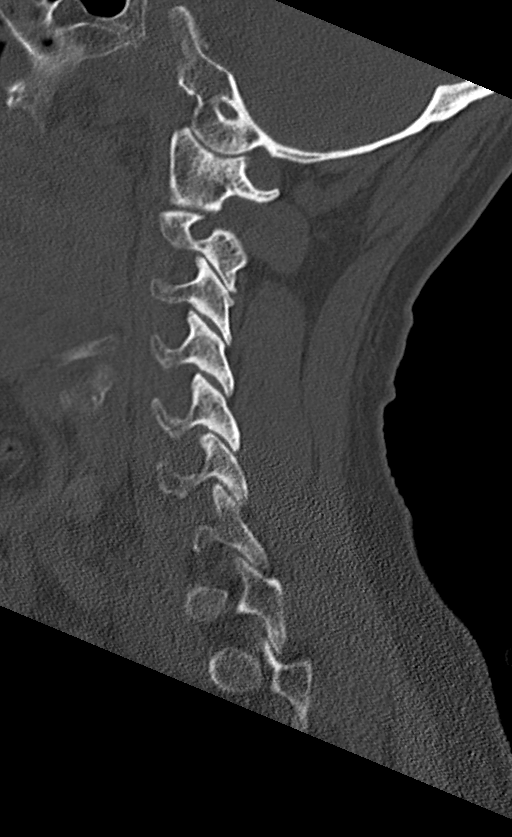
[im 53/80  bone]
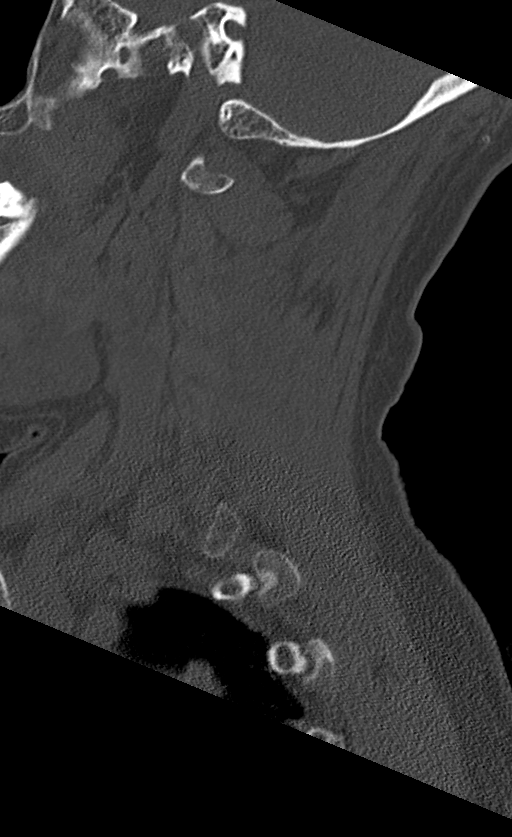

[Series 7: coronal bone · coronal · 0.31mm/px · 3 of 69 slices shown]
[im 15/69  bone]
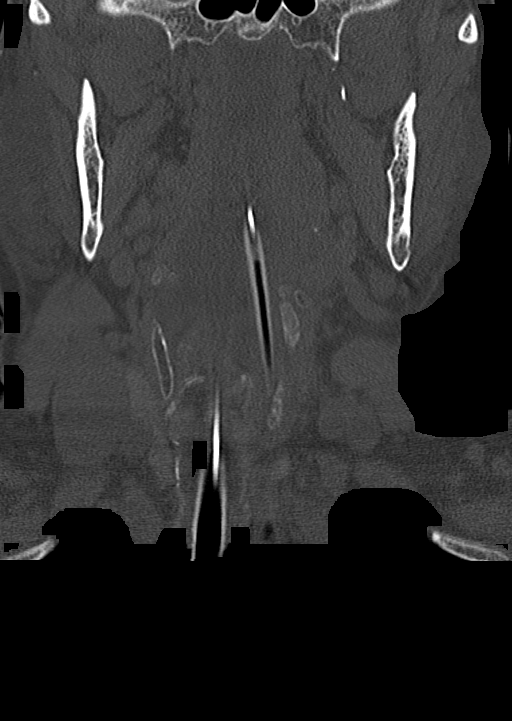
[im 28/69  bone]
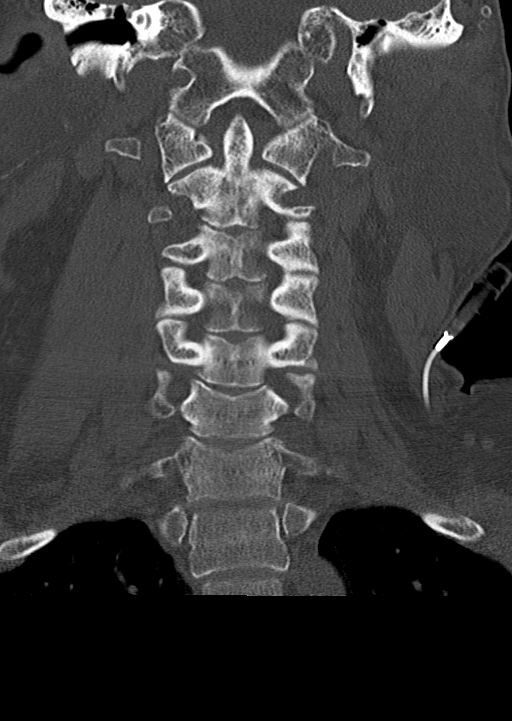
[im 41/69  bone]
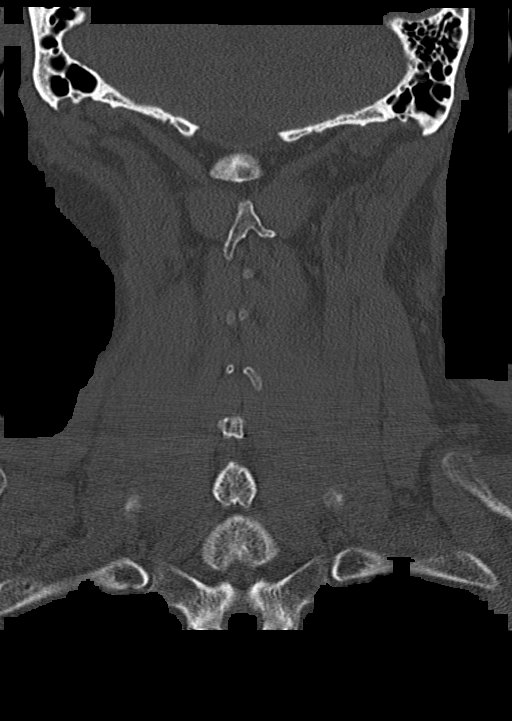

[Series 8: orthogonal bone · axial · 0.27mm/px · z∈[-272,-156]mm · 3 of 111 slices shown, 4 images]
[im 32/111  soft-tissue]
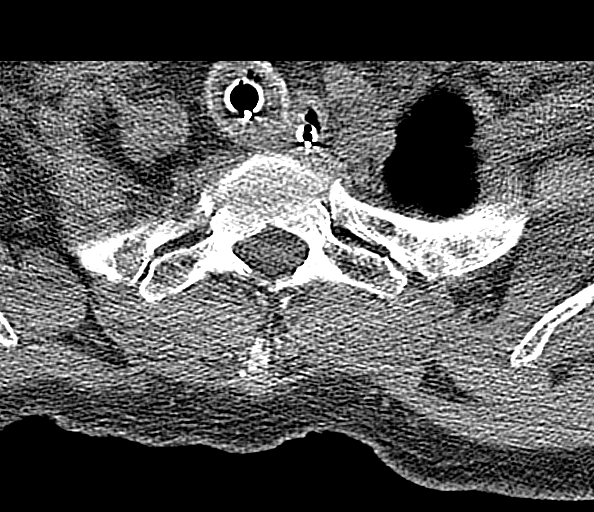
[im 32/111  bone]
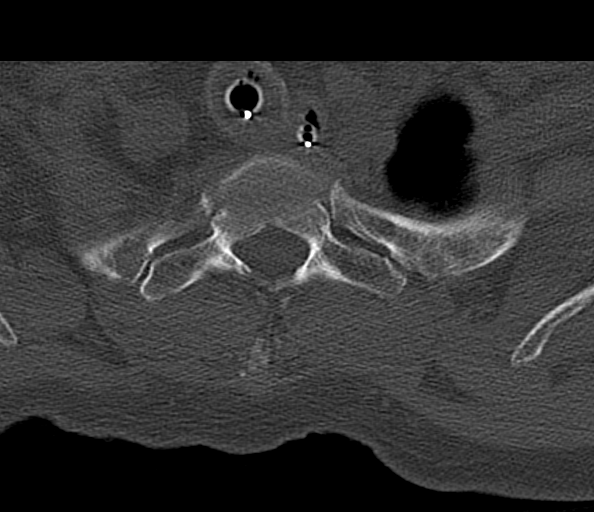
[im 63/111  bone]
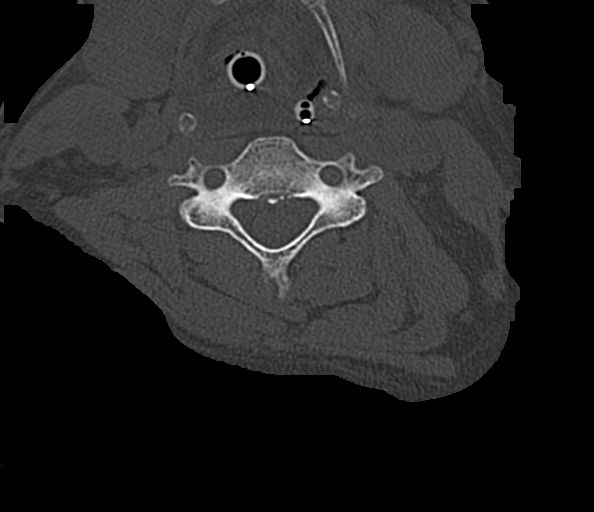
[im 95/111  bone]
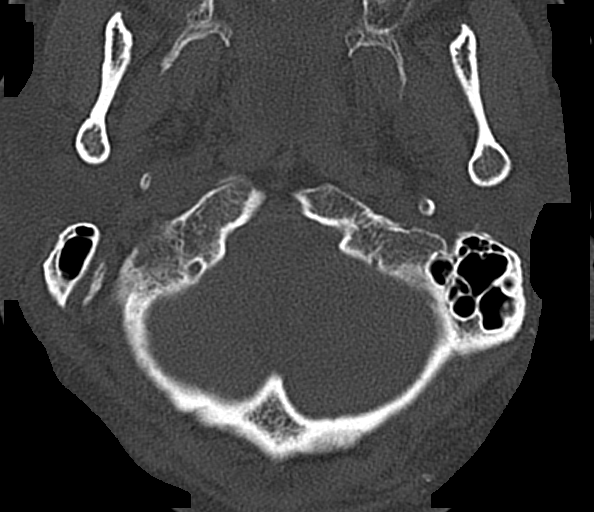

[11 of 33 positions shown; findings below may reference images not displayed]

FINDINGS: CT HEAD FINDINGS

Brain: No evidence of acute infarction, hemorrhage, hydrocephalus,
extra-axial collection or mass lesion/mass effect.

Vascular: There are vascular calcifications in the siphons and scalp
but no hyperdense central vessels.

Skull: Normal. Negative for fracture or focal lesion.

Sinuses/Orbits: There is mild membrane thickening in the ethmoid and
maxillary sinuses without fluid levels. Other visible sinuses,
mastoid air cells are clear. There is a chronic depressed fracture
of the medial wall right orbit.

Other: None.

CT CERVICAL SPINE FINDINGS

Alignment: Normal.

Skull base and vertebrae: There is normal bone mineralization with
no evidence of fractures or focal bone lesion.

Soft tissues and spinal canal: No prevertebral fluid or swelling. No
visible canal hematoma. There is partially visible orotracheal and
oroenteric intubation.

Disc levels: There is preservation of the normal vertebral and disc
heights.

There is early spondylosis, including with mild posterior disc
osteophyte complex formation at C3-4, C4-5 and C5-6.

At C6-7 there is a broad-based posterior disc protrusion mildly
deforming the ventral cord surface.

There are trace facet joint spurs at most levels but no significant
foraminal compromise at any level. Minimal uncinate spurring is seen
at C3-4 and C4-5 but is nonstenosing.

Upper chest: Negative.

Other: There is retained fluid in the nasopharynx and oropharynx.
IMPRESSION: 1. No acute intracranial CT findings or depressed skull fractures.
2. No evidence of cervical fractures or malalignment.
3. Early degenerative features of the cervical spine, and a
broad-based herniated disc at C6-7 deforming the ventral cord
surface.
4. Intubated with retained fluid in the nasopharynx and oropharynx

## 2022-11-26 IMAGING — CT CT HEAD W/O CM
4 series · 15 of 47 positions shown, 17 images · non-contrast
Comparison: No prior cervical spine CT. Comparison is made with the
head CT of 12/30/2021.

CLINICAL DATA: Blunt polytrauma.



[Series 2: head wo · axial · 0.42mm/px · z∈[-95,+25]mm · 7 of 33 slices shown, 9 images]
[im 5/33  brain]
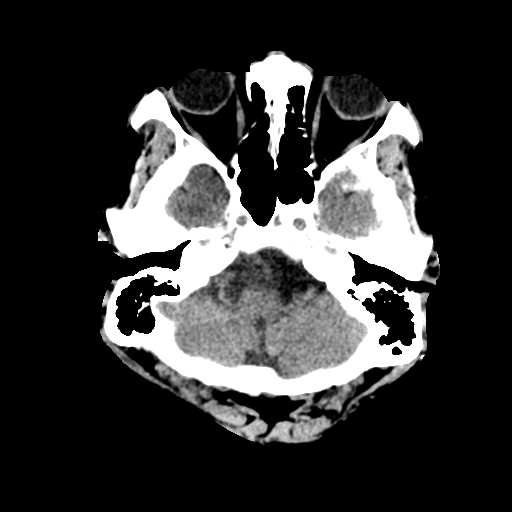
[im 5/33  bone]
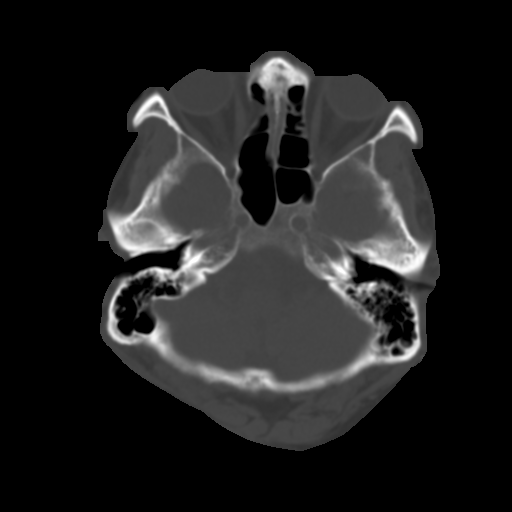
[im 9/33  brain]
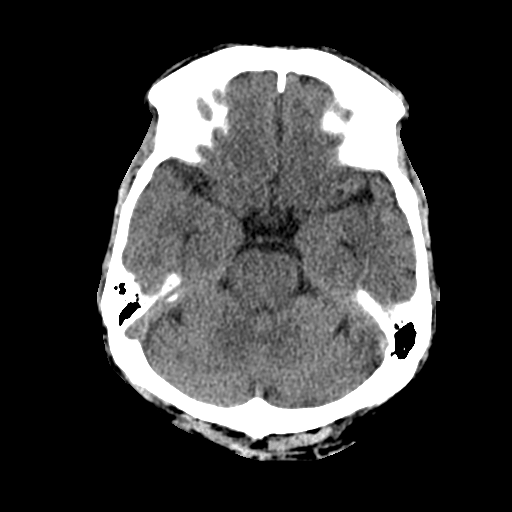
[im 13/33  brain]
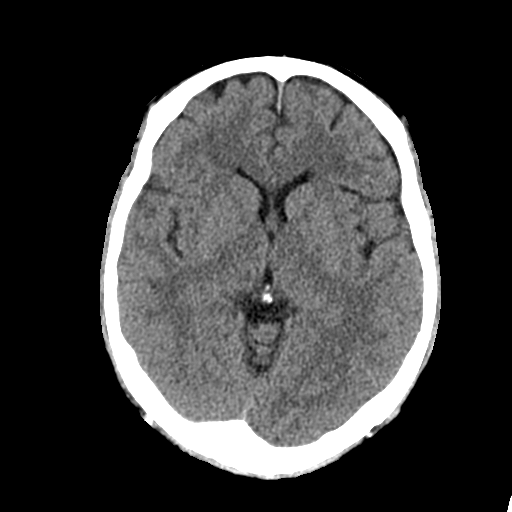
[im 17/33  brain]
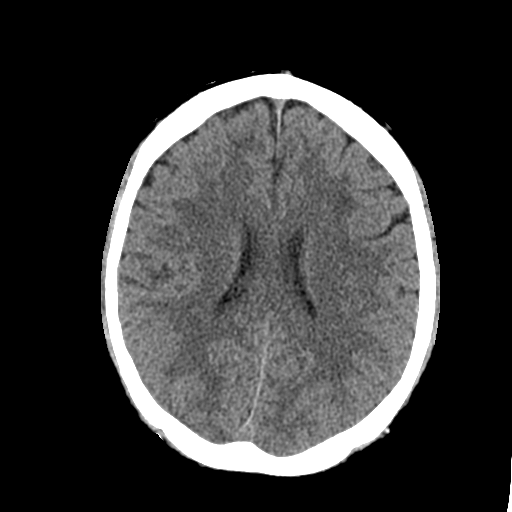
[im 21/33  brain]
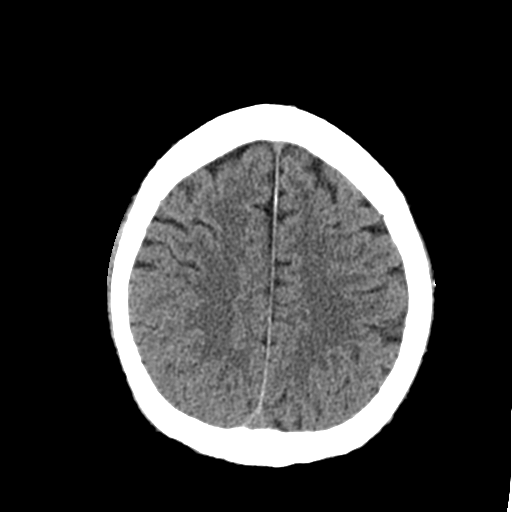
[im 21/33  bone]
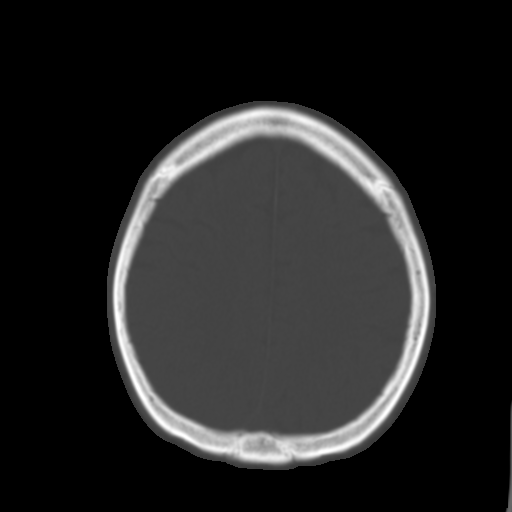
[im 25/33  brain]
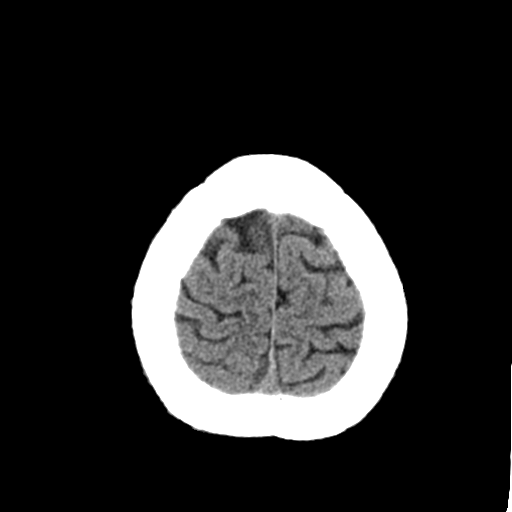
[im 29/33  brain]
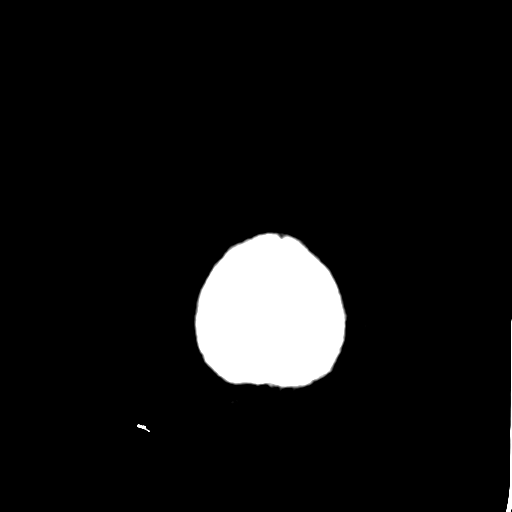

[Series 3: head bone · axial · 0.42mm/px · z∈[-99,-83]mm · 2 of 82 slices shown]
[im 9/82  bone]
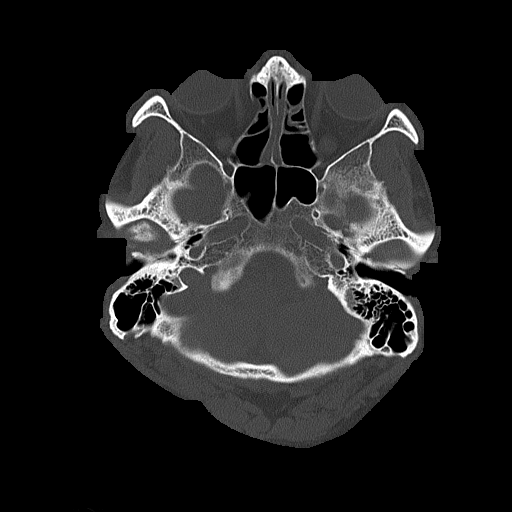
[im 17/82  bone]
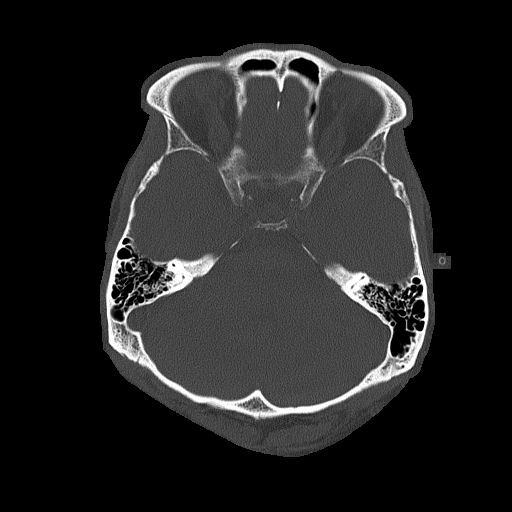

[Series 4: coronal soft tissue · coronal · 0.30mm/px · 3 of 65 slices shown]
[im 22/65  brain]
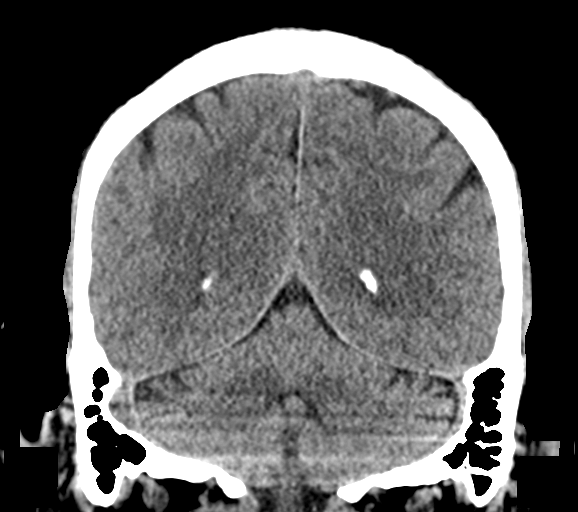
[im 29/65  brain]
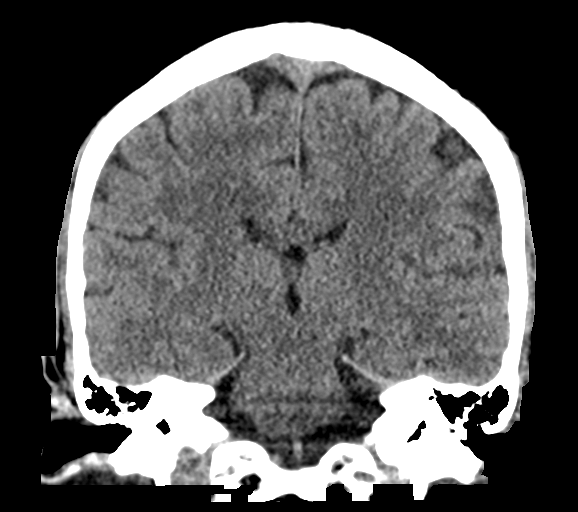
[im 36/65  brain]
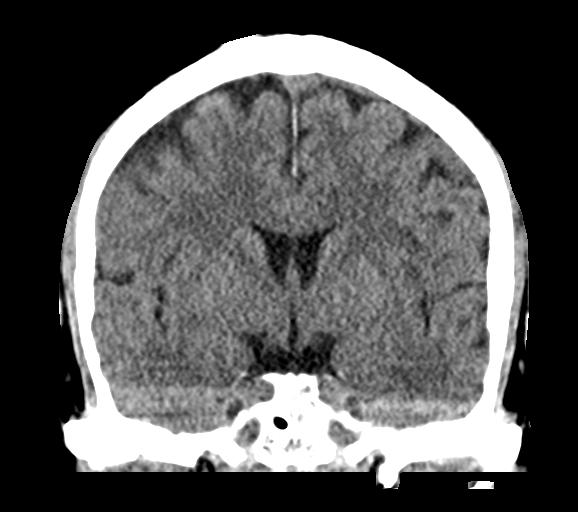

[Series 5: sagittal soft tissue · sagittal · 0.33mm/px · 3 of 59 slices shown]
[im 20/59  brain]
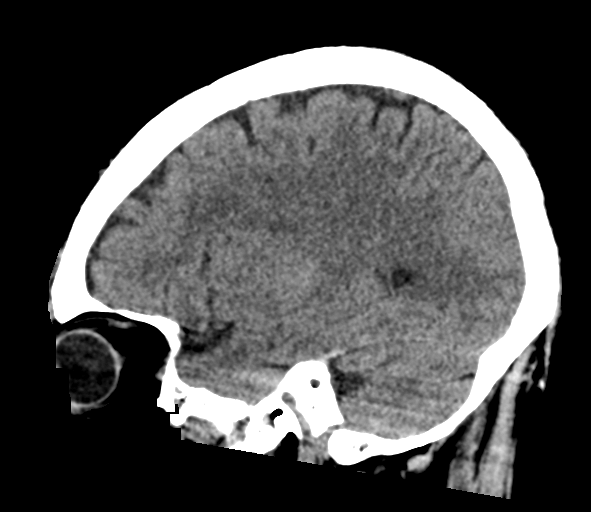
[im 30/59  brain]
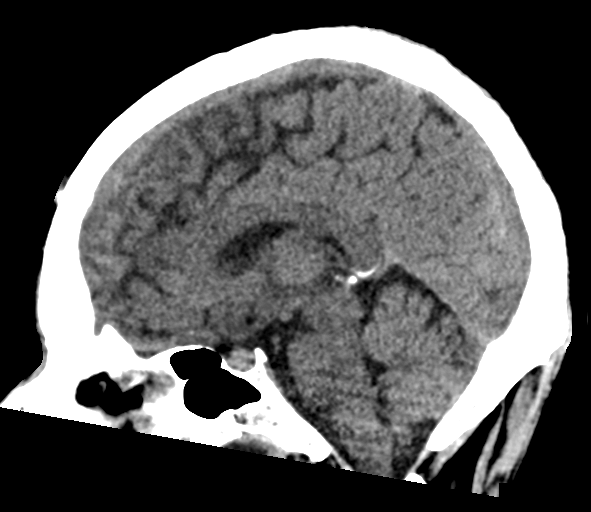
[im 39/59  brain]
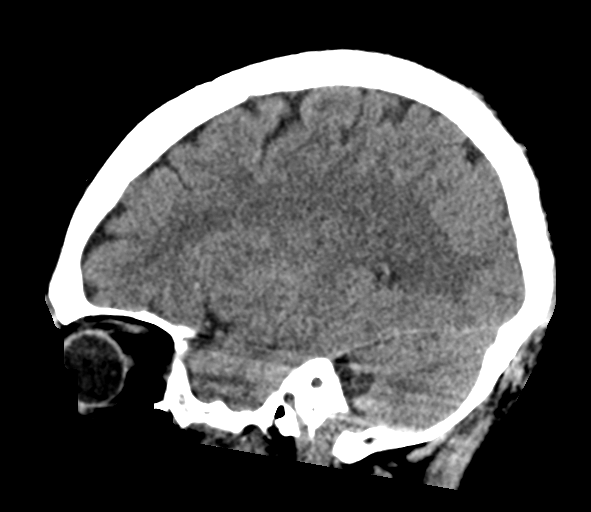

[15 of 47 positions shown; findings below may reference images not displayed]

FINDINGS: CT HEAD FINDINGS

Brain: No evidence of acute infarction, hemorrhage, hydrocephalus,
extra-axial collection or mass lesion/mass effect.

Vascular: There are vascular calcifications in the siphons and scalp
but no hyperdense central vessels.

Skull: Normal. Negative for fracture or focal lesion.

Sinuses/Orbits: There is mild membrane thickening in the ethmoid and
maxillary sinuses without fluid levels. Other visible sinuses,
mastoid air cells are clear. There is a chronic depressed fracture
of the medial wall right orbit.

Other: None.

CT CERVICAL SPINE FINDINGS

Alignment: Normal.

Skull base and vertebrae: There is normal bone mineralization with
no evidence of fractures or focal bone lesion.

Soft tissues and spinal canal: No prevertebral fluid or swelling. No
visible canal hematoma. There is partially visible orotracheal and
oroenteric intubation.

Disc levels: There is preservation of the normal vertebral and disc
heights.

There is early spondylosis, including with mild posterior disc
osteophyte complex formation at C3-4, C4-5 and C5-6.

At C6-7 there is a broad-based posterior disc protrusion mildly
deforming the ventral cord surface.

There are trace facet joint spurs at most levels but no significant
foraminal compromise at any level. Minimal uncinate spurring is seen
at C3-4 and C4-5 but is nonstenosing.

Upper chest: Negative.

Other: There is retained fluid in the nasopharynx and oropharynx.
IMPRESSION: 1. No acute intracranial CT findings or depressed skull fractures.
2. No evidence of cervical fractures or malalignment.
3. Early degenerative features of the cervical spine, and a
broad-based herniated disc at C6-7 deforming the ventral cord
surface.
4. Intubated with retained fluid in the nasopharynx and oropharynx

## 2023-01-02 ENCOUNTER — Emergency Department
Admission: EM | Admit: 2023-01-02 | Discharge: 2023-01-02 | Disposition: A | Discharge status: Home / Self Care | Payer: Self-pay | Attending: Emergency Medicine | Admitting: Emergency Medicine

## 2023-01-02 ENCOUNTER — Other Ambulatory Visit: Payer: Self-pay

## 2023-01-02 DIAGNOSIS — K4090 Unilateral inguinal hernia, without obstruction or gangrene, not specified as recurrent: Secondary | ICD-10-CM | POA: Insufficient documentation

## 2023-01-02 NOTE — ED Provider Notes (Signed)
   [facility] Provider Note    Event Date/Time   First MD Initiated Contact with Patient 01/02/23 1313     (approximate)   History   Groin Swelling   HPI  Shawn Torres is a 57 y.o. male with a history of cirrhosis who presents with complaints of right groin swelling.  He reports has been going on for many months, seems more swollen today than typical.  Family member is with him     Physical Exam   Triage Vital Signs: ED Triage Vitals  Enc Vitals Group     BP 01/02/23 1255 122/80     Pulse Rate 01/02/23 1255 78     Resp 01/02/23 1255 18     Temp 01/02/23 1255 98.5 F (36.9 C)     Temp Source 01/02/23 1255 Oral     SpO2 01/02/23 1255 100 %     Weight 01/02/23 1316 80 kg (176 lb 5.9 oz)     Height 01/02/23 1316 1.702 m (5\' 7" )     Head Circumference --      Peak Flow --      Pain Score 01/02/23 1301 6     Pain Loc --      Pain Edu? --      Excl. in [place]? --     Most recent vital signs: Vitals:   01/02/23 1255  BP: 122/80  Pulse: 78  Resp: 18  Temp: 98.5 F (36.9 C)  SpO2: 100%     General: Awake, no distress.  CV:  Good peripheral perfusion.  Resp:  Normal effort.  Abd:  No distention.  Right inguinal hernia, softball sized, easily reducible, soft, nontender Other:     ED Results / Procedures / Treatments   Labs (all labs ordered are listed, but only abnormal results are displayed) Labs Reviewed - No data to display   EKG     RADIOLOGY     PROCEDURES:  Critical Care performed:   Procedures   MEDICATIONS ORDERED IN ED: Medications - No data to display   IMPRESSION / MDM / [place] / ED COURSE  I reviewed the triage vital signs and the nursing notes. Patient's presentation is most consistent with exacerbation of chronic illness.  Patient presents with right groin swelling as above.  Exam is consistent with inguinal hernia, not incarcerated not entrapped, easily reducible, nontender  Patient  has a history of cirrhosis, this could complicate surgical repair however I will have him follow-up with general surgery for further evaluation and discussion        FINAL CLINICAL IMPRESSION(S) / ED DIAGNOSES   Final diagnoses:  Unilateral inguinal hernia without obstruction or gangrene, recurrence not specified     Rx / DC Orders   ED Discharge Orders     None        Note:  This document was prepared using Dragon voice recognition software and may include unintentional dictation errors.   Lavonia Drafts, MD 01/02/23 1434

## 2023-01-02 NOTE — ED Triage Notes (Signed)
Pt c/o tender masses in groin bilaterally for 3-4 months and ulcers on bilateral shins/feet that sometimes has gooey discharge. Pt AOX4, NAD noted.

## 2023-04-24 ENCOUNTER — Other Ambulatory Visit: Payer: Self-pay

## 2023-09-07 ENCOUNTER — Emergency Department: Payer: Medicaid Other

## 2023-09-07 ENCOUNTER — Encounter: Payer: Self-pay | Admitting: Emergency Medicine

## 2023-09-07 ENCOUNTER — Inpatient Hospital Stay
Admission: EM | Admit: 2023-09-07 | Discharge: 2023-09-11 | DRG: 442 | Disposition: A | Discharge status: Home / Self Care | Payer: Medicaid Other | Attending: Internal Medicine | Admitting: Internal Medicine

## 2023-09-07 ENCOUNTER — Other Ambulatory Visit: Payer: Self-pay

## 2023-09-07 DIAGNOSIS — T501X6A Underdosing of loop [high-ceiling] diuretics, initial encounter: Secondary | ICD-10-CM | POA: Diagnosis present

## 2023-09-07 DIAGNOSIS — T446X6A Underdosing of alpha-adrenoreceptor antagonists, initial encounter: Secondary | ICD-10-CM | POA: Diagnosis present

## 2023-09-07 DIAGNOSIS — F1011 Alcohol abuse, in remission: Secondary | ICD-10-CM | POA: Diagnosis present

## 2023-09-07 DIAGNOSIS — N182 Chronic kidney disease, stage 2 (mild): Secondary | ICD-10-CM | POA: Diagnosis present

## 2023-09-07 DIAGNOSIS — K7682 Hepatic encephalopathy: Principal | ICD-10-CM | POA: Diagnosis present

## 2023-09-07 DIAGNOSIS — R030 Elevated blood-pressure reading, without diagnosis of hypertension: Secondary | ICD-10-CM | POA: Diagnosis present

## 2023-09-07 DIAGNOSIS — D631 Anemia in chronic kidney disease: Secondary | ICD-10-CM | POA: Diagnosis present

## 2023-09-07 DIAGNOSIS — R531 Weakness: Secondary | ICD-10-CM | POA: Diagnosis present

## 2023-09-07 DIAGNOSIS — K2100 Gastro-esophageal reflux disease with esophagitis, without bleeding: Secondary | ICD-10-CM | POA: Diagnosis present

## 2023-09-07 DIAGNOSIS — T366X6A Underdosing of rifampicins, initial encounter: Secondary | ICD-10-CM | POA: Diagnosis present

## 2023-09-07 DIAGNOSIS — Z8719 Personal history of other diseases of the digestive system: Secondary | ICD-10-CM

## 2023-09-07 DIAGNOSIS — Z79899 Other long term (current) drug therapy: Secondary | ICD-10-CM

## 2023-09-07 DIAGNOSIS — R54 Age-related physical debility: Secondary | ICD-10-CM | POA: Diagnosis present

## 2023-09-07 DIAGNOSIS — Z888 Allergy status to other drugs, medicaments and biological substances status: Secondary | ICD-10-CM

## 2023-09-07 DIAGNOSIS — K920 Hematemesis: Secondary | ICD-10-CM | POA: Diagnosis present

## 2023-09-07 DIAGNOSIS — Z6830 Body mass index (BMI) 30.0-30.9, adult: Secondary | ICD-10-CM

## 2023-09-07 DIAGNOSIS — I8510 Secondary esophageal varices without bleeding: Secondary | ICD-10-CM | POA: Diagnosis present

## 2023-09-07 DIAGNOSIS — K7031 Alcoholic cirrhosis of liver with ascites: Secondary | ICD-10-CM | POA: Diagnosis present

## 2023-09-07 DIAGNOSIS — D689 Coagulation defect, unspecified: Secondary | ICD-10-CM | POA: Diagnosis present

## 2023-09-07 DIAGNOSIS — Z603 Acculturation difficulty: Secondary | ICD-10-CM | POA: Diagnosis present

## 2023-09-07 DIAGNOSIS — I4439 Other atrioventricular block: Secondary | ICD-10-CM | POA: Diagnosis present

## 2023-09-07 DIAGNOSIS — T500X6A Underdosing of mineralocorticoids and their antagonists, initial encounter: Secondary | ICD-10-CM | POA: Diagnosis present

## 2023-09-07 DIAGNOSIS — E440 Moderate protein-calorie malnutrition: Secondary | ICD-10-CM | POA: Diagnosis present

## 2023-09-07 DIAGNOSIS — K219 Gastro-esophageal reflux disease without esophagitis: Secondary | ICD-10-CM | POA: Diagnosis present

## 2023-09-07 DIAGNOSIS — T447X6A Underdosing of beta-adrenoreceptor antagonists, initial encounter: Secondary | ICD-10-CM | POA: Diagnosis present

## 2023-09-07 DIAGNOSIS — K766 Portal hypertension: Secondary | ICD-10-CM | POA: Diagnosis present

## 2023-09-07 DIAGNOSIS — E871 Hypo-osmolality and hyponatremia: Secondary | ICD-10-CM | POA: Diagnosis present

## 2023-09-07 DIAGNOSIS — E1122 Type 2 diabetes mellitus with diabetic chronic kidney disease: Secondary | ICD-10-CM | POA: Diagnosis present

## 2023-09-07 DIAGNOSIS — N400 Enlarged prostate without lower urinary tract symptoms: Secondary | ICD-10-CM | POA: Diagnosis present

## 2023-09-07 DIAGNOSIS — I4892 Unspecified atrial flutter: Secondary | ICD-10-CM | POA: Diagnosis present

## 2023-09-07 DIAGNOSIS — E872 Acidosis, unspecified: Secondary | ICD-10-CM | POA: Diagnosis present

## 2023-09-07 DIAGNOSIS — T471X6A Underdosing of other antacids and anti-gastric-secretion drugs, initial encounter: Secondary | ICD-10-CM | POA: Diagnosis present

## 2023-09-07 DIAGNOSIS — E8722 Chronic metabolic acidosis: Secondary | ICD-10-CM | POA: Diagnosis present

## 2023-09-07 DIAGNOSIS — Z91138 Patient's unintentional underdosing of medication regimen for other reason: Secondary | ICD-10-CM

## 2023-09-07 DIAGNOSIS — K3189 Other diseases of stomach and duodenum: Secondary | ICD-10-CM | POA: Diagnosis present

## 2023-09-07 LAB — CBC WITH DIFFERENTIAL/PLATELET
Abs Immature Granulocytes: 0.03 10*3/uL (ref 0.00–0.07)
Basophils Absolute: 0 10*3/uL (ref 0.0–0.1)
Basophils Relative: 0 %
Eosinophils Absolute: 0 10*3/uL (ref 0.0–0.5)
Eosinophils Relative: 0 %
HCT: 28.3 % — ABNORMAL LOW (ref 39.0–52.0)
Hemoglobin: 10.1 g/dL — ABNORMAL LOW (ref 13.0–17.0)
Immature Granulocytes: 0 %
Lymphocytes Relative: 16 %
Lymphs Abs: 1.2 10*3/uL (ref 0.7–4.0)
MCH: 33.3 pg (ref 26.0–34.0)
MCHC: 35.7 g/dL (ref 30.0–36.0)
MCV: 93.4 fL (ref 80.0–100.0)
Monocytes Absolute: 1.1 10*3/uL — ABNORMAL HIGH (ref 0.1–1.0)
Monocytes Relative: 15 %
Neutro Abs: 5.2 10*3/uL (ref 1.7–7.7)
Neutrophils Relative %: 69 %
Platelets: 134 10*3/uL — ABNORMAL LOW (ref 150–400)
RBC: 3.03 MIL/uL — ABNORMAL LOW (ref 4.22–5.81)
RDW: 14.5 % (ref 11.5–15.5)
WBC: 7.6 10*3/uL (ref 4.0–10.5)
nRBC: 0 % (ref 0.0–0.2)

## 2023-09-07 LAB — COMPREHENSIVE METABOLIC PANEL
ALT: 18 U/L (ref 0–44)
AST: 40 U/L (ref 15–41)
Albumin: 2.8 g/dL — ABNORMAL LOW (ref 3.5–5.0)
Alkaline Phosphatase: 96 U/L (ref 38–126)
Anion gap: 10 (ref 5–15)
BUN: 21 mg/dL — ABNORMAL HIGH (ref 6–20)
CO2: 18 mmol/L — ABNORMAL LOW (ref 22–32)
Calcium: 8.1 mg/dL — ABNORMAL LOW (ref 8.9–10.3)
Chloride: 105 mmol/L (ref 98–111)
Creatinine, Ser: 1.1 mg/dL (ref 0.61–1.24)
GFR, Estimated: >60 mL/min (ref >60)
Glucose, Bld: 125 mg/dL — ABNORMAL HIGH (ref 70–99)
Potassium: 3.6 mmol/L (ref 3.5–5.1)
Sodium: 133 mmol/L — ABNORMAL LOW (ref 135–145)
Total Bilirubin: 4.6 mg/dL — ABNORMAL HIGH (ref 0.3–1.2)
Total Protein: 7.3 g/dL (ref 6.5–8.1)

## 2023-09-07 LAB — LIPASE, BLOOD: Lipase: 36 U/L (ref 11–51)

## 2023-09-07 LAB — BRAIN NATRIURETIC PEPTIDE: B Natriuretic Peptide: 40.8 pg/mL (ref 0.0–100.0)

## 2023-09-07 LAB — BODY FLUID CELL COUNT WITH DIFFERENTIAL
Eos, Fluid: 0 %
Lymphs, Fluid: 65 %
Monocyte-Macrophage-Serous Fluid: 29 %
Neutrophil Count, Fluid: 6 %
Total Nucleated Cell Count, Fluid: 230 uL

## 2023-09-07 LAB — PROTIME-INR
INR: 2 — ABNORMAL HIGH (ref 0.8–1.2)
Prothrombin Time: 23 s — ABNORMAL HIGH (ref 11.4–15.2)

## 2023-09-07 LAB — AMMONIA: Ammonia: 174 umol/L — ABNORMAL HIGH (ref 9–35)

## 2023-09-07 LAB — APTT: aPTT: 39 s — ABNORMAL HIGH (ref 24–36)

## 2023-09-07 LAB — ETHANOL: Alcohol, Ethyl (B): <10 mg/dL (ref <10)

## 2023-09-07 LAB — LACTIC ACID, PLASMA
Lactic Acid, Venous: 2.2 mmol/L (ref 0.5–1.9)
Lactic Acid, Venous: 2.5 mmol/L (ref 0.5–1.9)

## 2023-09-07 MED ORDER — SODIUM CHLORIDE 0.9 % IV SOLN
2.0000 g | Freq: Once | INTRAVENOUS | Status: AC
  Administered 2023-09-07: 2 g via INTRAVENOUS
  Filled 2023-09-07: qty 12.5

## 2023-09-07 MED ORDER — LACTULOSE ENEMA
300.0000 mL | Freq: Once | ORAL | Status: DC
  Filled 2023-09-07: qty 300

## 2023-09-07 MED ORDER — ONDANSETRON HCL 4 MG/2ML IJ SOLN
4.0000 mg | Freq: Four times a day (QID) | INTRAMUSCULAR | Status: DC | PRN
  Administered 2023-09-08 (×2): 4 mg via INTRAVENOUS
  Filled 2023-09-07 (×2): qty 2

## 2023-09-07 MED ORDER — LACTULOSE 10 GM/15ML PO SOLN
30.0000 g | Freq: Three times a day (TID) | ORAL | Status: DC
  Administered 2023-09-09 – 2023-09-11 (×6): 30 g via ORAL
  Filled 2023-09-07 (×6): qty 60

## 2023-09-07 MED ORDER — ACETAMINOPHEN 325 MG PO TABS: 650.0000 mg | ORAL_TABLET | Freq: Four times a day (QID) | ORAL | Status: DC | PRN

## 2023-09-07 MED ORDER — SODIUM CHLORIDE 0.9% FLUSH
10.0000 mL | Freq: Two times a day (BID) | INTRAVENOUS | Status: DC
  Administered 2023-09-08 – 2023-09-11 (×6): 10 mL via INTRAVENOUS

## 2023-09-07 MED ORDER — SODIUM CHLORIDE 0.9 % IV SOLN
2.0000 g | INTRAVENOUS | Status: DC
  Administered 2023-09-07 – 2023-09-11 (×4): 2 g via INTRAVENOUS
  Filled 2023-09-07 (×5): qty 20

## 2023-09-07 MED ORDER — ENSURE ENLIVE PO LIQD
237.0000 mL | Freq: Three times a day (TID) | ORAL | Status: DC
  Administered 2023-09-09: 237 mL via ORAL

## 2023-09-07 MED ORDER — ACETAMINOPHEN 650 MG RE SUPP: 650.0000 mg | Freq: Four times a day (QID) | RECTAL | Status: DC | PRN

## 2023-09-07 MED ORDER — LACTULOSE 10 GM/15ML PO SOLN
30.0000 g | Freq: Once | ORAL | Status: DC
  Filled 2023-09-07: qty 60

## 2023-09-07 MED ORDER — PANTOPRAZOLE SODIUM 40 MG PO TBEC: 40.0000 mg | DELAYED_RELEASE_TABLET | Freq: Two times a day (BID) | ORAL | Status: DC

## 2023-09-07 MED ORDER — THIAMINE MONONITRATE 100 MG PO TABS
100.0000 mg | ORAL_TABLET | Freq: Every day | ORAL | Status: DC
  Administered 2023-09-10 – 2023-09-11 (×2): 100 mg via ORAL
  Filled 2023-09-07 (×2): qty 1

## 2023-09-07 MED ORDER — LIDOCAINE HCL (PF) 1 % IJ SOLN
5.0000 mL | Freq: Once | INTRAMUSCULAR | Status: AC
  Administered 2023-09-07: 5 mL
  Filled 2023-09-07: qty 5

## 2023-09-07 MED ORDER — LABETALOL HCL 5 MG/ML IV SOLN: 10.0000 mg | INTRAVENOUS | Status: DC | PRN

## 2023-09-07 MED ORDER — CARVEDILOL 6.25 MG PO TABS
6.2500 mg | ORAL_TABLET | Freq: Two times a day (BID) | ORAL | Status: DC
  Administered 2023-09-09 – 2023-09-11 (×4): 6.25 mg via ORAL
  Filled 2023-09-07 (×4): qty 1

## 2023-09-07 MED ORDER — PANTOPRAZOLE SODIUM 40 MG IV SOLR
40.0000 mg | Freq: Two times a day (BID) | INTRAVENOUS | Status: DC
  Administered 2023-09-07: 40 mg via INTRAVENOUS
  Filled 2023-09-07: qty 10

## 2023-09-07 MED ORDER — LACTULOSE ENEMA
300.0000 mL | Freq: Once | ORAL | Status: AC
  Administered 2023-09-07: 300 mL via RECTAL
  Filled 2023-09-07: qty 300

## 2023-09-07 MED ORDER — RIFAXIMIN 550 MG PO TABS
550.0000 mg | ORAL_TABLET | Freq: Two times a day (BID) | ORAL | Status: DC
  Administered 2023-09-09 – 2023-09-11 (×4): 550 mg via ORAL
  Filled 2023-09-07 (×4): qty 1

## 2023-09-07 MED ORDER — SODIUM CHLORIDE 0.9 % IV BOLUS (SEPSIS)
500.0000 mL | Freq: Once | INTRAVENOUS | Status: AC
  Administered 2023-09-07: 500 mL via INTRAVENOUS

## 2023-09-07 MED ORDER — ONDANSETRON HCL 4 MG PO TABS: 4.0000 mg | ORAL_TABLET | Freq: Four times a day (QID) | ORAL | Status: DC | PRN

## 2023-09-07 MED ORDER — FUROSEMIDE 40 MG PO TABS
40.0000 mg | ORAL_TABLET | Freq: Every day | ORAL | Status: DC
  Administered 2023-09-10 – 2023-09-11 (×2): 40 mg via ORAL
  Filled 2023-09-07 (×2): qty 1

## 2023-09-07 MED ORDER — VITAMIN K1 10 MG/ML IJ SOLN
5.0000 mg | Freq: Once | INTRAMUSCULAR | Status: AC
  Administered 2023-09-07: 5 mg via SUBCUTANEOUS
  Filled 2023-09-07: qty 0.5

## 2023-09-07 MED ORDER — SPIRONOLACTONE 25 MG PO TABS
100.0000 mg | ORAL_TABLET | Freq: Every day | ORAL | Status: DC
  Administered 2023-09-10 – 2023-09-11 (×2): 100 mg via ORAL
  Filled 2023-09-07 (×2): qty 4

## 2023-09-07 MED ORDER — PHYTONADIONE 5 MG PO TABS
10.0000 mg | ORAL_TABLET | Freq: Once | ORAL | Status: DC
  Filled 2023-09-07: qty 2

## 2023-09-07 MED ORDER — METRONIDAZOLE 500 MG/100ML IV SOLN
500.0000 mg | Freq: Once | INTRAVENOUS | Status: AC
  Administered 2023-09-07: 500 mg via INTRAVENOUS
  Filled 2023-09-07: qty 100

## 2023-09-07 MED ORDER — TAMSULOSIN HCL 0.4 MG PO CAPS
0.4000 mg | ORAL_CAPSULE | Freq: Every day | ORAL | Status: DC
  Administered 2023-09-10 – 2023-09-11 (×2): 0.4 mg via ORAL
  Filled 2023-09-07 (×2): qty 1

## 2023-09-07 NOTE — Sepsis Progress Note (Signed)
Elink will follow per sepsis protocol

## 2023-09-07 NOTE — Progress Notes (Signed)
PHARMACY -  BRIEF ANTIBIOTIC NOTE   Pharmacy has received consult(s) for ceefeoipn from an ED provider.  The patient's profile has been reviewed for ht/wt/allergies/indication/available labs.    One time order(s) placed for cefepime 2 grams x 1  Further antibiotics/pharmacy consults should be ordered by admitting physician if indicated.                       Thank you,  Elliot Gurney, PharmD, BCPS Clinical Pharmacist  09/07/2023 4:54 PM

## 2023-09-07 NOTE — ED Triage Notes (Signed)
Patient presents via EMS for weakness. Per family, pt has hx of cirrhosis and had fluid drained from abd yesterday. Since this AM has been notably lethargic.   Per EMS, sepsis alert triggered

## 2023-09-07 NOTE — ED Notes (Signed)
Pt at CT

## 2023-09-07 NOTE — H&P (Signed)
History and Physical    Shawn Torres MWN:[id number] DOB: 05/27/1966 DOA: 09/07/2023  PCP: Kurtis Bushman, MD (Confirm with patient/family/NH records and if not entered, this has to be entered at [facility] point of entry) Patient coming from: Home  I have personally briefly reviewed patient's old medical records in [facility]  Chief Complaint: Patient is confused  HPI: Shawn Torres is a 57 y.o. male with medical history significant of alcoholic cirrhosis, recurrent hepatic encephalopathy, IIDM, brought in by family member for evaluation of confusion.  Patient is confused, or history provided by daughter at bedside.  Family reported that at baseline patient follows with [facility] at GI for his cirrhosis, his condition has been relatively stable recently, however starting from about 7 to 8 days ago patient started to build up fluid with increasing amount of ascites and patient started to feel " fullness" and lost appetite and felt nauseous when having lactulose.  As result he stopped taking lactulose for the last 7 days.  Yesterday patient went to [facility] outpatient IR to have a large paracentesis of about 10 L and went home.  This morning, family found the patient was very sleepy at 6 AM and 9 AM patient became less responsive and mumbling for nonsense and patient family decided to bring him to the hospital.  Family reported the patient has been sober.  ED Course: Afebrile, tachycardia and blood pressure elevated 160/88, O2 saturation 100% on room air.  Chest x-ray no acute infiltrates.  Blood work showed ammonium 174, AST ALT within normal limits, albumin 2.8, total bilirubin 4.6 and INR 2.0.  Hemoglobin 10.1 compared to baseline 8.5.  Lactic acid 2.2  Patient was given broad-spectrum antibiotics cefepime Flagyl.  Diagnostic paracentesis done by ED physician at bedside  Review of Systems: Unable to perform, patient is confused.  Past Medical History:  Diagnosis Date   Ascites     Cirrhosis, alcoholic (HCC)    Diabetes mellitus without complication (HCC)    Memory loss     Past Surgical History:  Procedure Laterality Date   ESOPHAGOGASTRODUODENOSCOPY N/A 10/10/2021   Procedure: ESOPHAGOGASTRODUODENOSCOPY (EGD);  Surgeon: Midge Minium, MD;  Location: [facility] ENDOSCOPY;  Service: Endoscopy;  Laterality: N/A;   ESOPHAGOGASTRODUODENOSCOPY N/A 12/27/2021   Procedure: ESOPHAGOGASTRODUODENOSCOPY (EGD);  Surgeon: [name], DO;  Location: [facility] ENDOSCOPY;  Service: Gastroenterology;  Laterality: N/A;  Spanish Interpreter   ESOPHAGOGASTRODUODENOSCOPY N/A 01/20/2022   Procedure: ESOPHAGOGASTRODUODENOSCOPY (EGD);  Surgeon: [name], DO;  Location: [facility] ENDOSCOPY;  Service: Gastroenterology;  Laterality: N/A;  Spanish Interpreter   ESOPHAGOGASTRODUODENOSCOPY N/A 02/03/2022   Procedure: ESOPHAGOGASTRODUODENOSCOPY (EGD);  Surgeon: [name], DO;  Location: [facility] ENDOSCOPY;  Service: Gastroenterology;  Laterality: N/A;  Needs Spanish Interpreter - (prefers) all calls to daughter (she speaks [place])   ESOPHAGOGASTRODUODENOSCOPY (EGD) WITH PROPOFOL N/A 12/11/2021   Procedure: ESOPHAGOGASTRODUODENOSCOPY (EGD) WITH PROPOFOL;  Surgeon: [name], DO;  Location: [facility] ENDOSCOPY;  Service: Gastroenterology;  Laterality: N/A;   IR PARACENTESIS  02/15/2022     reports that he has never smoked. He has never used smokeless tobacco. He reports that he does not currently use alcohol. He reports that he does not use drugs.  Allergies  Allergen Reactions   Albumin Gc Rash    Patient gets rash with albumin. See media tab picture dated 12/24/21. Needs pre-meds w/ albumin (benadryl) to avoid allergic reaction.     Family History  Family history unknown: Yes     Prior to Admission medications  Medication Sig Start Date End Date Taking? Authorizing Provider  carvedilol (COREG) 6.25 MG tablet Take 1 tablet (6.25 mg total) by mouth 2 (two) times daily.  07/27/22   Arnetha Courser, MD  feeding supplement (ENSURE ENLIVE / ENSURE PLUS) LIQD Take 237 mLs by mouth 3 (three) times daily between meals. 07/27/22   Arnetha Courser, MD  furosemide (LASIX) 40 MG tablet Take 1 tablet (40 mg total) by mouth daily. 07/27/22   Arnetha Courser, MD  Multiple Vitamin (MULTIVITAMIN WITH MINERALS) TABS tablet Take 1 tablet by mouth daily. 07/28/22   Arnetha Courser, MD  pantoprazole (PROTONIX) 40 MG tablet Take 1 tablet (40 mg total) by mouth 2 (two) times daily. 07/27/22   Arnetha Courser, MD  rifaximin (XIFAXAN) 550 MG TABS tablet Take 1 tablet (550 mg total) by mouth 2 (two) times daily. 07/27/22   Arnetha Courser, MD  spironolactone (ALDACTONE) 100 MG tablet Take 1 tablet (100 mg total) by mouth daily. 07/27/22   Arnetha Courser, MD  tamsulosin (FLOMAX) 0.4 MG CAPS capsule Take 1 capsule (0.4 mg total) by mouth daily. 07/27/22   Arnetha Courser, MD  thiamine (VITAMIN B-1) 100 MG tablet Take 1 tablet (100 mg total) by mouth daily. 07/28/22   Arnetha Courser, MD    Physical Exam: Vitals:   09/07/23 1602 09/07/23 1630 09/07/23 1700 09/07/23 1730  BP: (!) 161/88 (!) 149/72 (!) 147/85 (!) 155/88  Pulse: (!) 105 98 (!) 102 (!) 105  Resp: 20 20    Temp: 98.5 F (36.9 C)     TempSrc: Axillary     SpO2: 100% 100% 100% 100%    Constitutional: NAD, calm, comfortable Vitals:   09/07/23 1602 09/07/23 1630 09/07/23 1700 09/07/23 1730  BP: (!) 161/88 (!) 149/72 (!) 147/85 (!) 155/88  Pulse: (!) 105 98 (!) 102 (!) 105  Resp: 20 20    Temp: 98.5 F (36.9 C)     TempSrc: Axillary     SpO2: 100% 100% 100% 100%   Eyes: PERRL, lids and conjunctivae normal ENMT: Mucous membranes are moist. Posterior pharynx clear of any exudate or lesions.Normal dentition.  Neck: normal, supple, no masses, no thyromegaly Respiratory: clear to auscultation bilaterally, no wheezing, no crackles. Normal respiratory effort. No accessory muscle use.  Cardiovascular: Regular rate and rhythm, no murmurs / rubs / gallops. No  extremity edema. 2+ pedal pulses. No carotid bruits.  Abdomen: no tenderness, no masses palpated. No hepatosplenomegaly. Bowel sounds positive.  Distended and large amount of ascites Musculoskeletal: no clubbing / cyanosis. No joint deformity upper and lower extremities. Good ROM, no contractures. Normal muscle tone.  Skin: no rashes, lesions, ulcers. No induration Neurologic: No facial droops, moving all limbs, not following commands.  Tremors noticed on bilateral arms Psychiatric: Lethargic, arousable, confused    Labs on Admission: I have personally reviewed following labs and imaging studies  CBC: Recent Labs  Lab 09/07/23 1611  WBC 7.6  NEUTROABS 5.2  HGB 10.1*  HCT 28.3*  MCV 93.4  PLT 134*   Basic Metabolic Panel: Recent Labs  Lab 09/07/23 1611  NA 133*  K 3.6  CL 105  CO2 18*  GLUCOSE 125*  BUN 21*  CREATININE 1.10  CALCIUM 8.1*   GFR: CrCl cannot be calculated (Unknown ideal weight.). Liver Function Tests: Recent Labs  Lab 09/07/23 1611  AST 40  ALT 18  ALKPHOS 96  BILITOT 4.6*  PROT 7.3  ALBUMIN 2.8*   Recent Labs  Lab 09/07/23 1611  LIPASE 36  Recent Labs  Lab 09/07/23 1610  AMMONIA 174*   Coagulation Profile: Recent Labs  Lab 09/07/23 1611  INR 2.0*   Cardiac Enzymes: No results for input(s): "CKTOTAL", "CKMB", "CKMBINDEX", "TROPONINI" in the last 168 hours. BNP (last 3 results) No results for input(s): "PROBNP" in the last 8760 hours. HbA1C: No results for input(s): "HGBA1C" in the last 72 hours. CBG: No results for input(s): "GLUCAP" in the last 168 hours. Lipid Profile: No results for input(s): "CHOL", "HDL", "LDLCALC", "TRIG", "CHOLHDL", "LDLDIRECT" in the last 72 hours. Thyroid Function Tests: No results for input(s): "TSH", "T4TOTAL", "FREET4", "T3FREE", "THYROIDAB" in the last 72 hours. Anemia Panel: No results for input(s): "VITAMINB12", "FOLATE", "FERRITIN", "TIBC", "IRON", "RETICCTPCT" in the last 72 hours. Urine  analysis:    Component Value Date/Time   COLORURINE AMBER (A) 07/26/2022 1314   APPEARANCEUR HAZY (A) 07/26/2022 1314   LABSPEC 1.017 07/26/2022 1314   PHURINE 5.0 07/26/2022 1314   GLUCOSEU NEGATIVE 07/26/2022 1314   HGBUR MODERATE (A) 07/26/2022 1314   BILIRUBINUR NEGATIVE 07/26/2022 1314   KETONESUR NEGATIVE 07/26/2022 1314   PROTEINUR NEGATIVE 07/26/2022 1314   NITRITE NEGATIVE 07/26/2022 1314   LEUKOCYTESUR SMALL (A) 07/26/2022 1314    Radiological Exams on Admission: CT Head Wo Contrast  Result Date: 09/07/2023 CLINICAL DATA:  Mental status change, unknown cause EXAM: CT HEAD WITHOUT CONTRAST TECHNIQUE: Contiguous axial images were obtained from the base of the skull through the vertex without intravenous contrast. RADIATION DOSE REDUCTION: This exam was performed according to the departmental dose-optimization program which includes automated exposure control, adjustment of the mA and/or kV according to patient size and/or use of iterative reconstruction technique. COMPARISON:  CT Head 07/24/22 FINDINGS: Brain: No evidence of acute infarction, hemorrhage, hydrocephalus, extra-axial collection or mass lesion/mass effect. There is sequela of mild chronic microvascular ischemic change. Vascular: No hyperdense vessel or unexpected calcification. Skull: Normal. Negative for fracture or focal lesion. Sinuses/Orbits: No middle ear or mastoid effusion. Paranasal sinuses are notable for mucosal thickening in the bilateral maxillary sinuses. There is a chronic fracture of the lamina papyracea on the right. Dysconjugate gaze. Orbits are otherwise unremarkable. Other: None. IMPRESSION: No acute intracranial abnormality. Electronically Signed   By: Lorenza Cambridge M.D.   On: 09/07/2023 17:47   DG Ankle 2 Views Right  Result Date: 09/07/2023 CLINICAL DATA:  Bruising. EXAM: RIGHT ANKLE - 2 VIEW COMPARISON:  None Available. FINDINGS: Chronic ankylosis of the distal tibiofibular syndesmosis. No acute  fracture or dislocation. Soft tissues are radiographically unremarkable. IMPRESSION: No acute fracture or dislocation. Electronically Signed   By: Minerva Fester M.D.   On: 09/07/2023 17:33   DG Chest Port 1 View  Result Date: 09/07/2023 CLINICAL DATA:  Weakness. Questionable sepsis, evaluate for abnormality EXAM: PORTABLE CHEST 1 VIEW COMPARISON:  07/24/2022 FINDINGS: Low lung volumes. Stable cardiomediastinal silhouette. Elevated right hemidiaphragm. No focal consolidation, pleural effusion, or pneumothorax. No displaced rib fractures. IMPRESSION: No active disease. Electronically Signed   By: Minerva Fester M.D.   On: 09/07/2023 17:31    EKG: Independently reviewed.  Sinus rhythm, reported as a flutter  Assessment/Plan Principal Problem:   Hepatic encephalopathy (HCC) Active Problems:   Acute hepatic encephalopathy (HCC)  (please populate well all problems here in Problem List. (For example, if patient is on BP meds at home and you resume or decide to hold them, it is a problem that needs to be her. Same for CAD, COPD, HLD and so on)  Acute hepatic encephalopathy secondary to noncoherent  with lactulose -Resume lactulose 30 g 3 times daily, in case of poor oral tolerance, will give 1 dose of lactulose enema x 1. -Neurochecks every 4 hours -SBP prophylaxis, start IV ceftriaxone until SBP ruled out  Acute on chronic cirrhosis decompensation With acute encephalopathy, worsening of chronic coagulopathy, worsening of ascites/fluid overload -Encephalopathy treatment as above.  Continue rifaximin -Vitamin K 10 mg p.o. given and repeat INR tomorrow -Clinically patient still have large amount of ascites on physical exam, IR guided paracentesis ordered.  Diagnostic paracentesis was done today, and lab pending.  Plan to continue ceftriaxone for now -Continue Lasix and spironolactone  Chronic hyponatremia -Hypervolemic, likely secondary to cirrhosis decompensation -Continue Lasix  Chronic  normocytic anemia -Normal iron study, likely secondary to chronic illness  Chronic lactic acidosis -Suspect secondary to cirrhosis -SBP prophylactic treatment as above  DVT prophylaxis: SCD Code Status: Full code Family Communication: Daughter at bedside Disposition Plan: Patient is sick with hepatic encephalopathy and significant cirrhosis decompensation, requiring close monitoring and continuous lactulose treatment, expect more than 2 midnight hospital stay Consults called: None Admission status: Tele admit   Emeline General MD [facility] Pager [phone]  09/07/2023, 6:05 PM

## 2023-09-07 NOTE — Progress Notes (Signed)
Notified provider of need to order repeat lactic acid. Chronic Cirrhosis- no need to continue trend.

## 2023-09-07 NOTE — ED Notes (Signed)
Date and time results received: 09/07/23 1656  Test: Lactic Acid Critical Value: 2.2  Name of Provider Notified: Corena Herter, MD

## 2023-09-07 NOTE — Progress Notes (Signed)
CODE SEPSIS - PHARMACY COMMUNICATION  **Broad Spectrum Antibiotics should be administered within 1 hour of Sepsis diagnosis**  Time Code Sepsis Called/Page Received: 1611  Antibiotics Ordered: cefepime and metronidazole  Time of 1st antibiotic administration: 1626  Additional action taken by pharmacy: none  If necessary, Name of Provider/Nurse Contacted: n/a    Elliot Gurney, PharmD, BCPS Clinical Pharmacist  09/07/2023 4:56 PM

## 2023-09-07 NOTE — ED Notes (Signed)
Timeout done Dr. Arnoldo Morale. Verify patient name, DOB, Correct procedure, correct time, consent signed by pt daughter at bedside.

## 2023-09-07 NOTE — ED Provider Notes (Signed)
[facility] Provider Note    Event Date/Time   First MD Initiated Contact with Patient 09/07/23 1559     (approximate)   History   Weakness  Formal translating service utilized HPI  Shawn Torres is a 57 y.o. male past medical history significant for alcoholic cirrhosis who presents to the emergency department with altered mental status.  Patient had fluid drained from his abdomen yesterday.  Family called out for lethargy and altered mental status.  Patient unable to state his name or where he is.  No known trauma.  No family members at bedside at this time.  On chart review patient has had frequent paracentesis as an outpatient for alcoholic cirrhosis.     Physical Exam   Triage Vital Signs: ED Triage Vitals [09/07/23 1602]  Encounter Vitals Group     BP (!) 161/88     Systolic BP Percentile      Diastolic BP Percentile      Pulse Rate (!) 105     Resp 20     Temp 98.5 F (36.9 C)     Temp Source Axillary     SpO2 100 %     Weight      Height      Head Circumference      Peak Flow      Pain Score      Pain Loc      Pain Education      Exclude from Growth Chart     Most recent vital signs: Vitals:   09/07/23 1700 09/07/23 1730  BP: (!) 147/85 (!) 155/88  Pulse: (!) 102 (!) 105  Resp:    Temp:    SpO2: 100% 100%    Physical Exam Constitutional:      Appearance: He is well-developed. He is ill-appearing.  HENT:     Head: Atraumatic.  Eyes:     Conjunctiva/sclera: Conjunctivae normal.  Cardiovascular:     Rate and Rhythm: Regular rhythm.  Pulmonary:     Effort: No respiratory distress.  Musculoskeletal:     Cervical back: Normal range of motion.  Skin:    General: Skin is warm.  Neurological:     Mental Status: He is alert. Mental status is at baseline.     IMPRESSION / MDM / ASSESSMENT AND PLAN / ED COURSE  I reviewed the triage vital signs and the nursing notes.  On chart review patient has had recurrent  paracentesis and had large-volume paracentesis done yesterday.  Daughter arrived and states that he has not been doing well for the past couple of days with decreased p.o. intake.  Unknown if he still drinks alcohol but she does not believe so.  No falls or head trauma.  Worsening altered mental status today.  Differential diagnosis including sepsis, GI bleed, hepatic encephalopathy, intracranial hemorrhage, pneumonia  EKG  I, Corena Herter, the attending physician, personally viewed and interpreted this ECG.   Rate: 104  Rhythm reading as atrial flutter  Axis: Normal  Intervals: Normal  ST&T Change: None  No tachycardic or bradycardic dysrhythmias while on cardiac telemetry.  RADIOLOGY I independently reviewed imaging, my interpretation of imaging: CT scan of the head without signs of intracranial hemorrhage or infarction.  CT scan of the head read is still in process  Chest x-ray with no signs of pneumonia or pulmonary edema  LABS (all labs ordered are listed, but only abnormal results are displayed) Labs interpreted as -    Labs Reviewed  LACTIC ACID, PLASMA - Abnormal; Notable for the following components:      Result Value   Lactic Acid, Venous 2.2 (*)    All other components within normal limits  COMPREHENSIVE METABOLIC PANEL - Abnormal; Notable for the following components:   Sodium 133 (*)    CO2 18 (*)    Glucose, Bld 125 (*)    BUN 21 (*)    Calcium 8.1 (*)    Albumin 2.8 (*)    Total Bilirubin 4.6 (*)    All other components within normal limits  CBC WITH DIFFERENTIAL/PLATELET - Abnormal; Notable for the following components:   RBC 3.03 (*)    Hemoglobin 10.1 (*)    HCT 28.3 (*)    Platelets 134 (*)    Monocytes Absolute 1.1 (*)    All other components within normal limits  PROTIME-INR - Abnormal; Notable for the following components:   Prothrombin Time 23.0 (*)    INR 2.0 (*)    All other components within normal limits  APTT - Abnormal; Notable for  the following components:   aPTT 39 (*)    All other components within normal limits  AMMONIA - Abnormal; Notable for the following components:   Ammonia 174 (*)    All other components within normal limits  CULTURE, BLOOD (ROUTINE X 2)  CULTURE, BLOOD (ROUTINE X 2)  BODY FLUID CULTURE W GRAM STAIN  LIPASE, BLOOD  LACTIC ACID, PLASMA  BRAIN NATRIURETIC PEPTIDE  URINALYSIS, W/ REFLEX TO CULTURE (INFECTION SUSPECTED)  BODY FLUID CELL COUNT WITH DIFFERENTIAL  ETHANOL     MDM  On arrival patient with altered mental status and ill-appearing.  Blood cultures obtained.  Felt that 30 cc/kg of IV fluids may be detrimental to the patient given his history of cirrhosis, given a 500 bolus and will reevaluate.  Started on antibiotics to cover for intra-abdominal process.  Bedside paracentesis obtained to evaluate for SBP.  No obvious signs of a GI bleed.  Elevated T. bili but normal LFTs.  Lactic acid elevated at 2.2.  Significantly elevated ammonia level at 170s.  Patient given lactulose.  Consulted hospitalist for admission for hepatic encephalopathy     PROCEDURES:  Critical Care performed: yes  .Paracentesis  Date/Time: 09/07/2023 5:33 PM  Performed by: Corena Herter, MD Authorized by: Corena Herter, MD   Consent:    Consent obtained:  Verbal and written   Consent given by:  Patient and healthcare agent   Risks, benefits, and alternatives were discussed: yes     Risks discussed:  Bleeding, bowel perforation, infection and pain   Alternatives discussed:  Delayed treatment and alternative treatment Universal protocol:    Procedure explained and questions answered to patient or proxy's satisfaction: yes     Relevant documents present and verified: yes     Test results available: yes     Imaging studies available: yes     Required blood products, implants, devices, and special equipment available: yes     Site/side marked: yes     Immediately prior to procedure, a time out was  called: yes     Patient identity confirmed:  Verbally with patient, hospital-assigned identification number, anonymous protocol, patient vented/unresponsive and arm band Pre-procedure details:    Procedure purpose:  Diagnostic   Preparation: Patient was prepped and draped in usual sterile fashion   Anesthesia:    Anesthesia method:  Local infiltration   Local anesthetic:  Lidocaine 1% w/o epi Procedure details:    Needle  gauge:  20   Ultrasound guidance: yes     Puncture site:  R lower quadrant   Fluid removed amount:  15 cc   Fluid appearance:  Serosanguinous and cloudy   Dressing:  4x4 sterile gauze and adhesive bandage Post-procedure details:    Procedure completion:  Tolerated .Critical Care  Performed by: Corena Herter, MD Authorized by: Corena Herter, MD   Critical care provider statement:    Critical care time (minutes):  60   Critical care time was exclusive of:  Separately billable procedures and treating other patients   Critical care was necessary to treat or prevent imminent or life-threatening deterioration of the following conditions:  CNS failure or compromise   Critical care was time spent personally by me on the following activities:  Development of treatment plan with patient or surrogate, discussions with consultants, evaluation of patient's response to treatment, examination of patient, ordering and review of laboratory studies, ordering and review of radiographic studies, ordering and performing treatments and interventions, pulse oximetry, re-evaluation of patient's condition and review of old charts   I assumed direction of critical care for this patient from another provider in my specialty: no     Care discussed with: admitting provider     Patient's presentation is most consistent with acute presentation with potential threat to life or bodily function.   MEDICATIONS ORDERED IN ED: Medications  sodium chloride flush (NS) 0.9 % injection 10 mL (has no  administration in time range)  metroNIDAZOLE (FLAGYL) IVPB 500 mg (has no administration in time range)  lactulose (CHRONULAC) 10 GM/15ML solution 30 g (has no administration in time range)  sodium chloride 0.9 % bolus 500 mL (0 mLs Intravenous Stopped 09/07/23 1736)  ceFEPIme (MAXIPIME) 2 g in sodium chloride 0.9 % 100 mL IVPB (2 g Intravenous New Bag/Given 09/07/23 1626)  lidocaine (PF) (XYLOCAINE) 1 % injection 5 mL (5 mLs Other Given 09/07/23 1735)    FINAL CLINICAL IMPRESSION(S) / ED DIAGNOSES   Final diagnoses:  Weakness  Hepatic encephalopathy (HCC)     Rx / DC Orders   ED Discharge Orders     None        Note:  This document was prepared using Dragon voice recognition software and may include unintentional dictation errors.   Corena Herter, MD 09/07/23 1737

## 2023-09-08 DIAGNOSIS — E872 Acidosis, unspecified: Secondary | ICD-10-CM

## 2023-09-08 DIAGNOSIS — K7031 Alcoholic cirrhosis of liver with ascites: Secondary | ICD-10-CM

## 2023-09-08 DIAGNOSIS — E440 Moderate protein-calorie malnutrition: Secondary | ICD-10-CM

## 2023-09-08 DIAGNOSIS — K920 Hematemesis: Secondary | ICD-10-CM

## 2023-09-08 DIAGNOSIS — N400 Enlarged prostate without lower urinary tract symptoms: Secondary | ICD-10-CM

## 2023-09-08 LAB — CBC
HCT: 27.2 % — ABNORMAL LOW (ref 39.0–52.0)
HCT: 26.2 % — ABNORMAL LOW (ref 39.0–52.0)
Hemoglobin: 9.8 g/dL — ABNORMAL LOW (ref 13.0–17.0)
Hemoglobin: 9.5 g/dL — ABNORMAL LOW (ref 13.0–17.0)
MCH: 33.7 pg (ref 26.0–34.0)
MCH: 35.2 pg — ABNORMAL HIGH (ref 26.0–34.0)
MCHC: 36 g/dL (ref 30.0–36.0)
MCHC: 36.3 g/dL — ABNORMAL HIGH (ref 30.0–36.0)
MCV: 93.5 fL (ref 80.0–100.0)
MCV: 97 fL (ref 80.0–100.0)
Platelets: 117 10*3/uL — ABNORMAL LOW (ref 150–400)
Platelets: 118 10*3/uL — ABNORMAL LOW (ref 150–400)
RBC: 2.91 MIL/uL — ABNORMAL LOW (ref 4.22–5.81)
RBC: 2.7 MIL/uL — ABNORMAL LOW (ref 4.22–5.81)
RDW: 14.6 % (ref 11.5–15.5)
RDW: 14.7 % (ref 11.5–15.5)
WBC: 8 10*3/uL (ref 4.0–10.5)
WBC: 9.1 10*3/uL (ref 4.0–10.5)
nRBC: 0 % (ref 0.0–0.2)
nRBC: 0 % (ref 0.0–0.2)

## 2023-09-08 LAB — COMPREHENSIVE METABOLIC PANEL
ALT: 17 U/L (ref 0–44)
AST: 39 U/L (ref 15–41)
Albumin: 2.4 g/dL — ABNORMAL LOW (ref 3.5–5.0)
Alkaline Phosphatase: 83 U/L (ref 38–126)
Anion gap: 10 (ref 5–15)
BUN: 24 mg/dL — ABNORMAL HIGH (ref 6–20)
CO2: 17 mmol/L — ABNORMAL LOW (ref 22–32)
Calcium: 7.9 mg/dL — ABNORMAL LOW (ref 8.9–10.3)
Chloride: 110 mmol/L (ref 98–111)
Creatinine, Ser: 1.25 mg/dL — ABNORMAL HIGH (ref 0.61–1.24)
GFR, Estimated: >60 mL/min (ref >60)
Glucose, Bld: 138 mg/dL — ABNORMAL HIGH (ref 70–99)
Potassium: 3.4 mmol/L — ABNORMAL LOW (ref 3.5–5.1)
Sodium: 137 mmol/L (ref 135–145)
Total Bilirubin: 4.9 mg/dL — ABNORMAL HIGH (ref 0.3–1.2)
Total Protein: 6.8 g/dL (ref 6.5–8.1)

## 2023-09-08 LAB — HEMOGLOBIN AND HEMATOCRIT, BLOOD
HCT: 26.5 % — ABNORMAL LOW (ref 39.0–52.0)
Hemoglobin: 9.4 g/dL — ABNORMAL LOW (ref 13.0–17.0)

## 2023-09-08 LAB — HIV ANTIBODY (ROUTINE TESTING W REFLEX): HIV Screen 4th Generation wRfx: NONREACTIVE

## 2023-09-08 LAB — PROTIME-INR
INR: 2.3 — ABNORMAL HIGH (ref 0.8–1.2)
Prothrombin Time: 25.5 s — ABNORMAL HIGH (ref 11.4–15.2)

## 2023-09-08 LAB — LACTIC ACID, PLASMA
Lactic Acid, Venous: 2.5 mmol/L (ref 0.5–1.9)
Lactic Acid, Venous: 2.6 mmol/L (ref 0.5–1.9)

## 2023-09-08 LAB — AMMONIA: Ammonia: 130 umol/L — ABNORMAL HIGH (ref 9–35)

## 2023-09-08 LAB — PATHOLOGIST SMEAR REVIEW

## 2023-09-08 MED ORDER — SODIUM CHLORIDE 0.9% IV SOLUTION: Freq: Once | INTRAVENOUS | Status: DC

## 2023-09-08 MED ORDER — PANTOPRAZOLE SODIUM 40 MG IV SOLR: 40.0000 mg | Freq: Two times a day (BID) | INTRAVENOUS | Status: DC

## 2023-09-08 MED ORDER — PANTOPRAZOLE SODIUM 40 MG IV SOLR
40.0000 mg | INTRAVENOUS | Status: AC
  Administered 2023-09-08 (×2): 40 mg via INTRAVENOUS
  Filled 2023-09-08: qty 10

## 2023-09-08 MED ORDER — PANTOPRAZOLE SODIUM 40 MG IV SOLR
40.0000 mg | Freq: Three times a day (TID) | INTRAVENOUS | Status: AC
  Administered 2023-09-08 – 2023-09-11 (×9): 40 mg via INTRAVENOUS
  Filled 2023-09-08 (×9): qty 10

## 2023-09-08 MED ORDER — OCTREOTIDE LOAD VIA INFUSION
50.0000 ug | Freq: Once | INTRAVENOUS | Status: AC
  Administered 2023-09-08: 50 ug via INTRAVENOUS
  Filled 2023-09-08: qty 25

## 2023-09-08 MED ORDER — SODIUM CHLORIDE 0.9 % IV SOLN
50.0000 ug/h | INTRAVENOUS | Status: DC
  Administered 2023-09-08 – 2023-09-10 (×5): 50 ug/h via INTRAVENOUS
  Filled 2023-09-08 (×7): qty 1

## 2023-09-08 MED ORDER — LACTATED RINGERS IV SOLN: INTRAVENOUS | Status: DC

## 2023-09-08 MED ORDER — SODIUM CHLORIDE 0.9 % IV SOLN: 12.5000 mg | Freq: Four times a day (QID) | INTRAVENOUS | Status: DC | PRN

## 2023-09-08 NOTE — Assessment & Plan Note (Signed)
Patient developed hematemesis today with history of alcoholic decompensated cirrhosis. -GI consult-going for EGD tomorrow morning -6 units of FFP was ordered for tomorrow as recommended by GI -Monitor hemoglobin -Started on octreotide and PPI

## 2023-09-08 NOTE — Hospital Course (Addendum)
Taken from H&P.   Shawn Torres is a 57 y.o. male with medical history significant of alcoholic cirrhosis, recurrent hepatic encephalopathy, IIDM, brought in by family member for evaluation of confusion.  Over the past 7 to 8 days patient with gradually worsening ascites, poor appetite and p.o. intake, mild nausea while taking lactulose so he stopped taking lactulose. Patient follow-up with [facility] GI for his cirrhosis and had his outpatient paracentesis done with removal of 10 L of fluid.  Family found him more sleepy and less responsive this morning so they brought him to ED.  On presentation patient was afebrile, had mild tachycardia, blood pressure mildly elevated.  Chest x-ray with no acute infiltrate.  Labs pertinent for ammonia of 174, AST, ALT normal, albumin 2.8, T. bili 4.6 and INR 2.0.  Lactic acid 2.2.  Patient was started on broad-spectrum antibiotic with cefepime and Flagyl.  Diagnostic paracentesis was done.  10/18: Vital stable, preliminary blood and peritoneal fluid cultures are negative, CMP with potassium of 3.4, bicarb 17, slight increase in creatinine to 1.24 with baseline around 1.1, T. bili 4.9 and INR of 2.3.  Patient had 2 episodes of hematemesis, GI was consulted and 6 units of FFP was ordered at their request as he will be going for EGD tomorrow.  Started on PPI and octreotide.  10/19: No more hematemesis, hemoglobin at 8.4.  Going for EGD later today.  Some improvement in mentation but still not at baseline.  Gradually worsening abdominal distention.  10/20: Patient underwent EGD which shows some esophagitis, and nonbleeding varices.  Sign of portal hypertension.  Worsening abdominal distention, ordered paracentesis with albumin and likely can go back home tomorrow as mentation is not at baseline.  Patient is being considered for TIPS procedure at [facility] where he will follow-up from here.  10/21:Patient remained stable, now at baseline and wants to go home.  GI  signed off stating that he need to follow-up with his gastroenterologist at [facility].  Due to worsening abdominal distention we did another paracentesis before discharge followed by albumin injection with removal of 6.5 L of fluid.  Apparently was not taking his home medications so all new prescriptions were provided.  Patient was instructed to start taking all of his home medications and follow-up closely with his primary care provider and gastroenterologist at [facility] for further management.

## 2023-09-08 NOTE — Assessment & Plan Note (Signed)
Dietary supplement.

## 2023-09-08 NOTE — TOC Initial Note (Addendum)
Transition of Care [facility]) - Initial/Assessment Note    Patient Details  Name: Shawn Torres MRN: [id number] Date of Birth: [birth year]  Transition of Care [facility]) CM/SW Contact:    Liliana Cline, LCSW Phone Number: 09/08/2023, 3:45 PM  Clinical Narrative:                 Per chart review patient is uninsured. PCP is Dr. Hulan Fess with [facility].  CSW attempted to meet with patient at bedside to offer resources. Patient sleeping. Spanish ODC/MM packet left at bedside in case patient needs it.   Expected Discharge Plan: Home/Self Care Barriers to Discharge: Continued Medical Work up   Patient Goals and CMS Choice            Expected Discharge Plan and Services       Living arrangements for the past 2 months: Single Family Home                                      Prior Living Arrangements/Services Living arrangements for the past 2 months: Single Family Home                     Activities of Daily Living   ADL Screening (condition at time of admission) Independently performs ADLs?: No Does the patient have a NEW difficulty with bathing/dressing/toileting/self-feeding that is expected to last >3 days?: Yes (Initiates electronic notice to provider for possible OT consult) Does the patient have a NEW difficulty with getting in/out of bed, walking, or climbing stairs that is expected to last >3 days?: Yes (Initiates electronic notice to provider for possible PT consult) Does the patient have a NEW difficulty with communication that is expected to last >3 days?: Yes (Initiates electronic notice to provider for possible SLP consult) Is the patient deaf or have difficulty hearing?: No Does the patient have difficulty seeing, even when wearing glasses/contacts?: No Does the patient have difficulty concentrating, remembering, or making decisions?: Yes  Permission Sought/Granted                  Emotional Assessment              Admission diagnosis:   Hepatic encephalopathy (HCC) [K76.82] Weakness [R53.1] Patient Active Problem List   Diagnosis Date Noted   Acute hepatic encephalopathy (HCC) 02/13/2022   Abdominal pain 12/18/2021   Portal vein thrombosis 12/18/2021   Ascites    Acute upper GI bleed 12/13/2021   Acute blood loss anemia (ABLA) 12/11/2021   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   Confusion    SOB (shortness of breath)    Lactic acidosis 11/06/2021   GERD (gastroesophageal reflux disease) 11/06/2021   BPH (benign prostatic hyperplasia) 11/06/2021   CKD (chronic kidney disease) stage 2, GFR 60-89 ml/min 11/03/2021   Pancytopenia (HCC) 11/03/2021   Alcoholic cirrhosis of liver with ascites (HCC) 10/29/2021   Alcohol use disorder, severe, in early remission (HCC)    Malnutrition of moderate degree 10/15/2021   Abdominal distention    Hematemesis 10/10/2021   Hepatic encephalopathy (HCC)    Secondary esophageal varices without bleeding (HCC)    Thrombocytopenia (HCC) 04/30/2021   Type 2 diabetes mellitus, without long-term current use of insulin (HCC) 04/30/2021   Alcohol dependence with uncomplicated withdrawal (HCC) 05/24/2019   PCP:  Kurtis Bushman, MD Pharmacy:   [id number] [place], [place] - [address] [place] [address] Phone: [phone] Fax: [phone]  [facility] [address] [place] [address] Phone: [phone] Fax: [phone]  [id number] [place], [place] - [place] [address] AT [facility] OF [address] [place] [address] Phone: [phone] Fax: [phone]     Social Determinants of Health (SDOH) Social History: SDOH Screenings   Food Insecurity: No Food Insecurity (09/08/2023)  Housing: Low Risk  (09/08/2023)  Transportation Needs: No Transportation Needs (09/08/2023)  Utilities: Not At Risk (09/08/2023)  Financial Resource Strain: Low Risk  (09/19/2021)    Received from [facility]  Tobacco Use: Low Risk  (09/07/2023)   SDOH Interventions:     Readmission Risk Interventions    12/13/2021    4:10 PM 10/12/2021   12:41 PM  Readmission Risk Prevention Plan  Transportation Screening Complete Complete  PCP or Specialist Appt within 3-5 Days  Complete  HRI or Home Care Consult  Not Complete  HRI or Home Care Consult comments  unsure of discharge needs  Social Work Consult for Recovery Care Planning/Counseling  Complete  Palliative Care Screening  Not Applicable  Medication Review Oceanographer) Complete Complete  SW Recovery Care/Counseling Consult Complete   Palliative Care Screening Not Applicable   Skilled Nursing Facility Not Applicable

## 2023-09-08 NOTE — Assessment & Plan Note (Signed)
-

## 2023-09-08 NOTE — Progress Notes (Signed)
Patient awake, more alert this morning moving extremities in bed and talking; continue to be confused x 4. HAD x 1 large green vomit, non odorous, Zofran given IV,

## 2023-09-08 NOTE — Progress Notes (Signed)
Patient brought to floor by tech. Pt daughter at bed side. Patient is not responsive to verbal communication, does not follow command.respond to touch and pain.place in bed and made comfortable.Iv intact bilaterally. Provider informed that all pt meds are PO and pt not able to swallow due to his cognition.

## 2023-09-08 NOTE — Progress Notes (Signed)
Upon entering room to introduce patient to role of nurse navigator, patient noted to have approximately 500 ml of bloody emesis running from mouth to his gown. Bedside nurse immediately notified and assisted to clean patient. Patient minimally interactive during this time. Some simple commands/questions provided in Spanish, answered with yes/no. Patient noted to be perseverating on one particular word with no apparently meaning in either [place] or [place].

## 2023-09-08 NOTE — Plan of Care (Signed)
  Problem: Education: Goal: Understanding of post-operative needs will improve Outcome: Progressing   Problem: Respiratory: Goal: Will regain and/or maintain adequate ventilation Outcome: Progressing   Problem: Activity: Goal: Risk for activity intolerance will decrease Outcome: Progressing   Problem: Nutrition: Goal: Adequate nutrition will be maintained Outcome: Progressing   Problem: Coping: Goal: Level of anxiety will decrease Outcome: Progressing   Problem: Pain Managment: Goal: General experience of comfort will improve Outcome: Progressing   Problem: Safety: Goal: Ability to remain free from injury will improve Outcome: Progressing   Problem: Skin Integrity: Goal: Risk for impaired skin integrity will decrease Outcome: Progressing

## 2023-09-08 NOTE — Assessment & Plan Note (Signed)
Decompensated alcoholic liver cirrhosis, worsening T. bili and INR. S/p outpatient paracentesis with removal of 10 L and a diagnostic paracentesis in ED with cultures currently negative. -GI is on board -Continue with supportive care -Continue with prophylactic SBP treatment

## 2023-09-08 NOTE — Assessment & Plan Note (Signed)
Seems chronic likely with liver cirrhosis. -Continue to monitor

## 2023-09-08 NOTE — Consult Note (Cosign Needed Addendum)
GI Inpatient Consult Note  Reason for Consult: Hematemesis, decompensated cirrhosis   Attending Requesting Consult: Dr. Arnetha Courser, MD  History of Present Illness: Shawn Torres is a 57 y.o. male seen for evaluation of hematemesis and decompensated cirrhosis at the request of hospitalist - Dr. Arnetha Courser. Patient has a PMH of MetALD cirrhosis c/b esophageal varices, diuretic-resistant ascites requiring therapeutic LVP q2 weeks, and hepatic encephalopathy, T2DM, and hx of alcohol abuse in remission. He presented [facility] ED via EMS yesterday afternoon for chief complaint of lethargy, generalized weakness, and altered mental status. He had outpatient therapeutic LVP yesterday removing 10.2L of ascitic fluid. Upon presentation to the ED, he was hypertensive (161/88), tachycardic (105), RR 20, and O2 sats 100% on room air. Labs significant for sodium 133, BUN 21, albumin 2.8, hemoglobin 10.1, platelets 134K, INR 2.0, lactic acid 2.2, and ammonia 174. Sepsis protocol was initiated. He received 500 mL bolus IV fluids and started on broad-spectrum antibiotics. Diagnostic bedside paracentesis performed to rule out SBP. He received lactulose enema. He was admitted to the floor for concerns of hepatic encephalopathy. GI consulted this morning for further evaluation and management given concerns for hematemesis.   Patient seen and evaluated this afternoon resting comfortably in hospital bed. No family bedside. Video interpretor used for history. He reportedly had two episodes of emesis this morning. One episode was green in color. Around 0900 this morning he had evidence of ~500 cc of maroon colored hematemesis. Patient is able to tell me his name, but unable to tell me where he is, what day of the week it is, or who the current President is. He is minimally interactive. He is not able to verbalize any specific complaints at this time. Review of chart shows that he follows closely at [facility] and  was last seen in clinic two weeks ago. At this visit it was recommended he undergo evaluation for TIPS due to refractory ascites. He has not had abdominal or cardiac ultrasound yet. He reportedly doesn't like to take his Lactulose at home and had been a few weeks without taking it. It is unknown if he is taking his other home medications - Carvedilol, Lasix, Aldactone, Rifaximin, Protonix. He has hx of UGIB 2/2 variceal bleed. He was hospitalized Nov 2022 for variceal bleed requiring 4 bands. He was also hospitalized Jan 2023 for variceal bleed requiring 4 bands with hemostasis. He lives with daughter and daughter's boyfriend. Reportedly has been away from EtOH for past 2 years. Unknown NSAID history.   Summary of GI Procedures:  EGD 02/03/2022 - esophageal ulcer with no bleeding, regular Z-line, Grade II esophageal varices no bands placed, mild portal hypertensive gastropathy, normal duodenum   EGD 01/20/2022 - variable Z-line, Grade II EV found in distal esophagus with two bands placed with complete eradication, mild portal hypertensive gastropathy, normal duodenum   EGD 12/27/2021 - Grade II EV with two bands placed, mild PHG  EGD 12/11/2021 - Grade II EV found in distal esophagus with four bands placed with complete eradication, stigmata of recent esophageal varices bleeding including red wale and nipple signs were appreciated, mild PHG, no gastric varices, blood in duodenal bulb and second portion of duodenum   EGD 10/10/2021 - Grade III EV with stigmata of recent bleeding found in lower third of esophagus s/p four bands placed, normal stomach, erosion found in duodenal bulb non-bleeding  Past Medical History:  Past Medical History:  Diagnosis Date   Ascites    Cirrhosis, alcoholic (  HCC)    Diabetes mellitus without complication (HCC)    Memory loss     Problem List: Patient Active Problem List   Diagnosis Date Noted   Acute hepatic encephalopathy (HCC) 02/13/2022   Abdominal pain 12/18/2021    Portal vein thrombosis 12/18/2021   Ascites    Acute upper GI bleed 12/13/2021   Acute blood loss anemia (ABLA) 12/11/2021   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   Confusion    SOB (shortness of breath)    Lactic acidosis 11/06/2021   GERD (gastroesophageal reflux disease) 11/06/2021   BPH (benign prostatic hyperplasia) 11/06/2021   CKD (chronic kidney disease) stage 2, GFR 60-89 ml/min 11/03/2021   Pancytopenia (HCC) 11/03/2021   Alcoholic cirrhosis of liver with ascites (HCC) 10/29/2021   Alcohol use disorder, severe, in early remission (HCC)    Malnutrition of moderate degree 10/15/2021   Abdominal distention    Hematemesis 10/10/2021   Hepatic encephalopathy (HCC)    Secondary esophageal varices without bleeding (HCC)    Thrombocytopenia (HCC) 04/30/2021   Type 2 diabetes mellitus, without long-term current use of insulin (HCC) 04/30/2021   Alcohol dependence with uncomplicated withdrawal (HCC) 05/24/2019    Past Surgical History: Past Surgical History:  Procedure Laterality Date   ESOPHAGOGASTRODUODENOSCOPY N/A 10/10/2021   Procedure: ESOPHAGOGASTRODUODENOSCOPY (EGD);  Surgeon: Midge Minium, MD;  Location: [facility] ENDOSCOPY;  Service: Endoscopy;  Laterality: N/A;   ESOPHAGOGASTRODUODENOSCOPY N/A 12/27/2021   Procedure: ESOPHAGOGASTRODUODENOSCOPY (EGD);  Surgeon: [name], DO;  Location: [facility] ENDOSCOPY;  Service: Gastroenterology;  Laterality: N/A;  Spanish Interpreter   ESOPHAGOGASTRODUODENOSCOPY N/A 01/20/2022   Procedure: ESOPHAGOGASTRODUODENOSCOPY (EGD);  Surgeon: [name], DO;  Location: [facility] ENDOSCOPY;  Service: Gastroenterology;  Laterality: N/A;  Spanish Interpreter   ESOPHAGOGASTRODUODENOSCOPY N/A 02/03/2022   Procedure: ESOPHAGOGASTRODUODENOSCOPY (EGD);  Surgeon: [name], DO;  Location: [facility] ENDOSCOPY;  Service: Gastroenterology;  Laterality: N/A;  Needs Spanish Interpreter - (prefers) all calls to daughter (she speaks [place])    ESOPHAGOGASTRODUODENOSCOPY (EGD) WITH PROPOFOL N/A 12/11/2021   Procedure: ESOPHAGOGASTRODUODENOSCOPY (EGD) WITH PROPOFOL;  Surgeon: [name], DO;  Location: [facility] ENDOSCOPY;  Service: Gastroenterology;  Laterality: N/A;   IR PARACENTESIS  02/15/2022    Allergies: Allergies  Allergen Reactions   Albumin Gc Rash    Patient gets rash with albumin. See media tab picture dated 12/24/21. Needs pre-meds w/ albumin (benadryl) to avoid allergic reaction.     Home Medications: Medications Prior to Admission  Medication Sig Dispense Refill Last Dose   lactulose (CHRONULAC) 10 GM/15ML solution Take 20 g by mouth 4 (four) times daily.   Past Week   carvedilol (COREG) 6.25 MG tablet Take 1 tablet (6.25 mg total) by mouth 2 (two) times daily. (Patient not taking: Reported on 09/08/2023) 60 tablet 1 Not Taking   feeding supplement (ENSURE ENLIVE / ENSURE PLUS) LIQD Take 237 mLs by mouth 3 (three) times daily between meals. 237 mL 12    furosemide (LASIX) 40 MG tablet Take 1 tablet (40 mg total) by mouth daily. (Patient not taking: Reported on 09/08/2023) 30 tablet 1 Not Taking   Multiple Vitamin (MULTIVITAMIN WITH MINERALS) TABS tablet Take 1 tablet by mouth daily. (Patient not taking: Reported on 09/08/2023) 90 tablet 0 Not Taking   pantoprazole (PROTONIX) 40 MG tablet Take 1 tablet (40 mg total) by mouth 2 (two) times daily. (Patient not taking: Reported on 09/08/2023) 60 tablet 1 Not Taking   rifaximin (XIFAXAN) 550 MG TABS tablet Take 1 tablet (550 mg total) by mouth 2 (  two) times daily. (Patient not taking: Reported on 09/08/2023) 60 tablet 1 Not Taking   spironolactone (ALDACTONE) 100 MG tablet Take 1 tablet (100 mg total) by mouth daily. (Patient not taking: Reported on 09/08/2023) 30 tablet 2 Not Taking   tamsulosin (FLOMAX) 0.4 MG CAPS capsule Take 1 capsule (0.4 mg total) by mouth daily. (Patient not taking: Reported on 09/08/2023) 30 capsule 1 Not Taking   thiamine (VITAMIN B-1) 100 MG  tablet Take 1 tablet (100 mg total) by mouth daily. (Patient not taking: Reported on 09/08/2023) 90 tablet 0 Not Taking   Home medication reconciliation was completed with the patient.   Scheduled Inpatient Medications:    carvedilol  6.25 mg Oral BID   feeding supplement  237 mL Oral TID BM   furosemide  40 mg Oral Daily   lactulose  30 g Oral TID   pantoprazole (PROTONIX) IV  40 mg Intravenous Q8H   Followed by   Melene Muller ON 09/11/2023] pantoprazole (PROTONIX) IV  40 mg Intravenous Q12H   rifaximin  550 mg Oral BID   sodium chloride flush  10 mL Intravenous Q12H   spironolactone  100 mg Oral Daily   tamsulosin  0.4 mg Oral Daily   thiamine  100 mg Oral Daily    Continuous Inpatient Infusions:    cefTRIAXone (ROCEPHIN)  IV 2 g (09/07/23 2354)   lactated ringers 75 mL/hr at 09/08/23 1345   octreotide (SANDOSTATIN) 500 mcg in sodium chloride 0.9 % 250 mL (2 mcg/mL) infusion 50 mcg/hr (09/08/23 1133)   promethazine (PHENERGAN) injection (IM or IVPB)      PRN Inpatient Medications:  acetaminophen **OR** acetaminophen, labetalol, ondansetron **OR** ondansetron (ZOFRAN) IV, promethazine (PHENERGAN) injection (IM or IVPB)  Family History: Family history is unknown by patient.  The patient's family history is negative for inflammatory bowel disorders, GI malignancy, or solid organ transplantation.  Social History:   reports that he has never smoked. He has never used smokeless tobacco. He reports that he does not currently use alcohol. He reports that he does not use drugs. The patient denies ETOH, tobacco, or drug use.   Review of Systems:  Unable to obtain patient's ROS given mental status    Physical Examination: BP (!) 149/92 (BP Location: Left Arm)   Pulse (!) 109   Temp 98 F (36.7 C)   Resp 18   Ht 5\' 2"  (1.575 m)   Wt 74.5 kg   SpO2 98%   BMI 30.04 kg/m  Gen: NAD, alert but not oriented to place or time, no accessory muscle use HEENT: PEERLA, EOMI, Neck: supple,  no JVD or thyromegaly Chest: CTA bilaterally, no wheezes, crackles, or other adventitious sounds CV: RRR, no m/g/c/r Abd: soft, mildly distended, +BS in all four quadrants; nontender to palpation in all quadrants, no HSM, guarding, ridigity, or rebound tenderness Ext: no edema, well perfused with 2+ pulses, Skin: no rash or lesions noted Lymph: no LAD  Data: Lab Results  Component Value Date   WBC 9.1 09/08/2023   HGB 9.5 (L) 09/08/2023   HCT 26.2 (L) 09/08/2023   MCV 97.0 09/08/2023   PLT 118 (L) 09/08/2023   Recent Labs  Lab 09/07/23 1611 09/08/23 0539 09/08/23 1107  HGB 10.1* 9.8* 9.5*   Lab Results  Component Value Date   NA 137 09/08/2023   K 3.4 (L) 09/08/2023   CL 110 09/08/2023   CO2 17 (L) 09/08/2023   BUN 24 (H) 09/08/2023   CREATININE 1.25 (H) 09/08/2023  Lab Results  Component Value Date   ALT 17 09/08/2023   AST 39 09/08/2023   ALKPHOS 83 09/08/2023   BILITOT 4.9 (H) 09/08/2023   Recent Labs  Lab 09/07/23 1611 09/08/23 0539  APTT 39*  --   INR 2.0* 2.3*    Assessment/Plan:  [name] with a PMH of MetALD cirrhosis c/b esophageal varices, diuretic-resistant ascites requiring therapeutic LVP q2 weeks, and hepatic encephalopathy, T2DM, and hx of alcohol abuse in remission presented to the [facility] ED via EMS 10/17 for chief complaint of altered mental status suspected 2/2 hepatic encephalopathy. GI consulted this morning for concerns of upper GI bleed.   UGIB - DDx includes esophageal variceal bleed, PUD, gastritis, Dieulafoy's lesion, GAVE, erosive esophagitis, malignancy, polyp, etc. Reportedly had 1 large episode of hematemesis this morning. H&H stable.   Decompensated cirrhosis c/b esophageal varices, diuretic-resistant refractory ascites requiring LVP q2 weeks, and hepatic encephalopathy  MELD 3.0 33 points   Hepatic encephalopathy   Coagulopathic  Hx of esophageal variceal bleeding 09/2021, 11/2021  Portal Hypertension with  sequelae  Hx of medication noncompliance   Recommendations:  - Maintain 2 large bore IVs for access - Continue to monitor serial H&H. Transfuse for Hgb <7.0.  - Continue Ceftriaxone 2 gram every 24 hours IV for concerns of GIB - Continue Octreotide gtt - Continue Protonix 40 mg IV BID for gastric protection - Avoid NSAIDs. Hold DVT prophylaxis.  - Avoid frequent lab draws to prevent lab induced anemia - Monitor CBC, CMP, and INR daily - Continue to monitor for signs of GI bleeding. No overt GI blood loss at this time - Administer Vitamin K and FFP for coagulopathy. Goal INR 2.0 or less.  - Advise EGD tomorrow AM with Dr. Norma Fredrickson +/- endoscopic hemostasis. I have called patient's daughter and discussed plan of care with her and she is in agreement.  - NPO - GI following along with you   Thank you for the consult. Please call with questions or concerns.  Gilda Crease, PA-C [facility] [phone]

## 2023-09-08 NOTE — Assessment & Plan Note (Signed)
Patient with history of alcoholic cirrhosis being followed up at [facility]. Increased somnolence and altered mental status, concern for hepatic encephalopathy with elevated ammonia. -Continue with lactulose -Continue with rifaximin -GI was consulted

## 2023-09-08 NOTE — Plan of Care (Signed)
[facility] Tonsillectomy/Adenoidectomy, Postoperative PEDS care plan entered in error.

## 2023-09-08 NOTE — Progress Notes (Signed)
Progress Note   Patient: Shawn Torres ZOX:[id number] DOB: December 25, 1965 DOA: 09/07/2023     1 DOS: the patient was seen and examined on 09/08/2023   Brief hospital course: Taken from H&P.   Maddoxx Portnoy is a 57 y.o. male with medical history significant of alcoholic cirrhosis, recurrent hepatic encephalopathy, IIDM, brought in by family member for evaluation of confusion.  Over the past 7 to 8 days patient with gradually worsening ascites, poor appetite and p.o. intake, mild nausea while taking lactulose so he stopped taking lactulose. Patient follow-up with [facility] for his cirrhosis and had his outpatient paracentesis done with removal of 10 L of fluid.  Family found him more sleepy and less responsive this morning so they brought him to ED.  On presentation patient was afebrile, had mild tachycardia, blood pressure mildly elevated.  Chest x-ray with no acute infiltrate.  Labs pertinent for ammonia of 174, AST, ALT normal, albumin 2.8, T. bili 4.6 and INR 2.0.  Lactic acid 2.2.  Patient was started on broad-spectrum antibiotic with cefepime and Flagyl.  Diagnostic paracentesis was done.  10/18: Vital stable, preliminary blood and peritoneal fluid cultures are negative, CMP with potassium of 3.4, bicarb 17, slight increase in creatinine to 1.24 with baseline around 1.1, T. bili 4.9 and INR of 2.3.  Patient had 2 episodes of hematemesis, GI was consulted and 6 units of FFP was ordered at their request as he will be going for EGD tomorrow.  Started on PPI and octreotide.   Assessment and Plan: * Hepatic encephalopathy [facility]) Patient with history of alcoholic cirrhosis being followed up at [facility]. Increased somnolence and altered mental status, concern for hepatic encephalopathy with elevated ammonia. -Continue with lactulose -Continue with rifaximin -GI was consulted  Hematemesis Patient developed hematemesis today with history of alcoholic decompensated  cirrhosis. -GI consult-going for EGD tomorrow morning -6 units of FFP was ordered for tomorrow as recommended by GI -Monitor hemoglobin -Started on octreotide and PPI  Alcoholic cirrhosis of liver with ascites (HCC) Decompensated alcoholic liver cirrhosis, worsening T. bili and INR. S/p outpatient paracentesis with removal of 10 L and a diagnostic paracentesis in ED with cultures currently negative. -GI is on board -Continue with supportive care -Continue with prophylactic SBP treatment  Lactic acidosis Seems chronic likely with liver cirrhosis. -Continue to monitor  Malnutrition of moderate degree Dietary supplement.  BPH (benign prostatic hyperplasia) -Continue with flomax   Subjective: Patient appears lethargic, denies any pain.  A Spanish interpreter was used for communication.  Had 2 episodes of hematemesis.  Physical Exam: Vitals:   09/07/23 2357 09/08/23 0322 09/08/23 0755 09/08/23 1154  BP: 128/75 (!) 141/82 117/86 (!) 149/92  Pulse: (!) 106 (!) 105 (!) 109 (!) 109  Resp: 18 18 17 18   Temp:  98.4 F (36.9 C) 97.6 F (36.4 C) 98 F (36.7 C)  TempSrc:      SpO2: 100% 99% 96% 98%  Weight:      Height:       General.  Frail gentleman, In no acute distress. Pulmonary.  Lungs clear bilaterally, normal respiratory effort. CV.  Regular rate and rhythm, no JVD, rub or murmur. Abdomen.  Soft, nontender, mildly distended, BS positive. CNS.  Somnolent, no apparent deficit Extremities.  No edema, no cyanosis, pulses intact and symmetrical.  Data Reviewed: Prior data reviewed.  Family Communication:   Disposition: Status is: Inpatient Remains inpatient appropriate because: Severity of illness  Planned Discharge Destination: Home  Time spent: 82  minutes  This record has been created using Conservation officer, historic buildings. Errors have been sought and corrected,but may not always be located. Such creation errors do not reflect on the standard of care.    Author: Arnetha Courser, MD 09/08/2023 5:53 PM  For on call review [email or web address].

## 2023-09-09 LAB — HEMOGLOBIN AND HEMATOCRIT, BLOOD
HCT: 22.9 % — ABNORMAL LOW (ref 39.0–52.0)
HCT: 24.5 % — ABNORMAL LOW (ref 39.0–52.0)
HCT: 25.6 % — ABNORMAL LOW (ref 39.0–52.0)
HCT: 26.1 % — ABNORMAL LOW (ref 39.0–52.0)
Hemoglobin: 8.4 g/dL — ABNORMAL LOW (ref 13.0–17.0)
Hemoglobin: 8.6 g/dL — ABNORMAL LOW (ref 13.0–17.0)
Hemoglobin: 8.8 g/dL — ABNORMAL LOW (ref 13.0–17.0)
Hemoglobin: 8.9 g/dL — ABNORMAL LOW (ref 13.0–17.0)

## 2023-09-09 LAB — COMPREHENSIVE METABOLIC PANEL
ALT: 16 U/L (ref 0–44)
AST: 25 U/L (ref 15–41)
Albumin: 2.5 g/dL — ABNORMAL LOW (ref 3.5–5.0)
Alkaline Phosphatase: 78 U/L (ref 38–126)
Anion gap: 7 (ref 5–15)
BUN: 32 mg/dL — ABNORMAL HIGH (ref 6–20)
CO2: 21 mmol/L — ABNORMAL LOW (ref 22–32)
Calcium: 8 mg/dL — ABNORMAL LOW (ref 8.9–10.3)
Chloride: 114 mmol/L — ABNORMAL HIGH (ref 98–111)
Creatinine, Ser: 1.31 mg/dL — ABNORMAL HIGH (ref 0.61–1.24)
GFR, Estimated: >60 mL/min (ref >60)
Glucose, Bld: 136 mg/dL — ABNORMAL HIGH (ref 70–99)
Potassium: 2.9 mmol/L — ABNORMAL LOW (ref 3.5–5.1)
Sodium: 142 mmol/L (ref 135–145)
Total Bilirubin: 3.7 mg/dL — ABNORMAL HIGH (ref 0.3–1.2)
Total Protein: 6.5 g/dL (ref 6.5–8.1)

## 2023-09-09 LAB — PROTIME-INR
INR: 1.8 — ABNORMAL HIGH (ref 0.8–1.2)
Prothrombin Time: 21.3 s — ABNORMAL HIGH (ref 11.4–15.2)

## 2023-09-09 MED ORDER — POTASSIUM CHLORIDE 10 MEQ/100ML IV SOLN
10.0000 meq | INTRAVENOUS | Status: AC
  Administered 2023-09-09 – 2023-09-10 (×6): 10 meq via INTRAVENOUS
  Filled 2023-09-09 (×3): qty 100

## 2023-09-09 NOTE — Assessment & Plan Note (Signed)
Patient had 2 episode of hematemesis yesterday, no hematemesis today.  History of variceal bleed requiring multiple procedures and banding in the past.  Hemoglobin at 8.4.  GI was consulted s/p EGD today with concern of esophagitis, portal hypertensive gastropathy and grade 1 varices with no active bleeding. -6 units of FFP was ordered for today as recommended by GI -Monitor hemoglobin -Octreotide can be discontinued as there was no active variceal bleed -Continue PPI

## 2023-09-09 NOTE — Evaluation (Signed)
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   Confusion    SOB (shortness of breath)    Lactic acidosis 11/06/2021   GERD (gastroesophageal reflux disease) 11/06/2021   BPH (benign prostatic hyperplasia) 11/06/2021   CKD (chronic kidney disease) stage 2, GFR 60-89 ml/min 11/03/2021   Pancytopenia (HCC) 11/03/2021   Alcoholic cirrhosis of liver with ascites (HCC) 10/29/2021   Alcohol use disorder, severe, in early remission (HCC)    Malnutrition of moderate degree 10/15/2021   Abdominal distention    Hematemesis 10/10/2021   Hepatic encephalopathy (HCC)    Secondary esophageal varices without bleeding (HCC)    Thrombocytopenia (HCC) 04/30/2021   Type 2 diabetes mellitus, without long-term current use of insulin (HCC) 04/30/2021   Alcohol dependence with uncomplicated withdrawal (HCC) 05/24/2019   Past Medical History:  Past Medical History:  Diagnosis Date   Ascites    Cirrhosis, alcoholic (HCC)    Diabetes mellitus without complication (HCC)    Memory loss    Past Surgical History:  Past Surgical History:  Procedure Laterality Date   ESOPHAGOGASTRODUODENOSCOPY N/A 10/10/2021   Procedure: ESOPHAGOGASTRODUODENOSCOPY (EGD);  Surgeon: Midge Minium, MD;  Location[facility] ENDOSCOPY;  Service: Endoscopy;  Laterality: N/A;   ESOPHAGOGASTRODUODENOSCOPY N/A 12/27/2021   Procedure: ESOPHAGOGASTRODUODENOSCOPY (EGD);  Surgeon: [name], DO;  Location: [facility] ENDOSCOPY;  Service: Gastroenterology;  Laterality: N/A;   Spanish Interpreter   ESOPHAGOGASTRODUODENOSCOPY N/A 01/20/2022   Procedure: ESOPHAGOGASTRODUODENOSCOPY (EGD);  Surgeon: [name], DO;  Location: [facility] ENDOSCOPY;  Service: Gastroenterology;  Laterality: N/A;  Spanish Interpreter   ESOPHAGOGASTRODUODENOSCOPY N/A 02/03/2022   Procedure: ESOPHAGOGASTRODUODENOSCOPY (EGD);  Surgeon: [name], DO;  Location: [facility] ENDOSCOPY;  Service: Gastroenterology;  Laterality: N/A;  Needs Spanish Interpreter - (prefers) all calls to daughter (she speaks [place])   ESOPHAGOGASTRODUODENOSCOPY (EGD) WITH PROPOFOL N/A 12/11/2021   Procedure: ESOPHAGOGASTRODUODENOSCOPY (EGD) WITH PROPOFOL;  Surgeon: [name], DO;  Location: [facility] ENDOSCOPY;  Service: Gastroenterology;  Laterality: N/A;   IR PARACENTESIS  02/15/2022   HPI:  Pt is a 57 y.o. male with medical history significant of alcoholic cirrhosis, recurrent hepatic encephalopathy, IIDM, brought in by family member for evaluation of confusion.  Family reported that at baseline patient follows with [facility] at GI for his cirrhosis, his condition has been relatively stable recently, however starting from about 7 to 8 days ago patient started to build up fluid with increasing amount of ascites and patient started to feel " fullness" and lost appetite and felt nauseous when having lactulose.  As result he stopped taking lactulose for the last 7 days.  Yesterday patient went to [facility] outpatient IR to have a large paracentesis of about 10 L and went home.  This morning, family found the patient was very sleepy at 6 AM and 9 AM patient became less responsive and mumbling for nonsense and patient family decided to bring him to the hospital.    Head CT: No evidence of acute infarction, hemorrhage, hydrocephalus,  extra-axial collection or mass lesion/mass effect. There is sequela  of mild chronic microvascular ischemic  change.  CXR: negative.   Assessment / Plan / Recommendation Clinical  Impression   Pt seen today w/ for informal Cognitive-linguistic screening at bedside; informal language assessment.  Pt awake, resting in bed. Verbal; Spanish speaking primarily per report. Online Interpreter usedGlenda Chroman, [id number].  Pt exhibited Severe confusion during general engagement and question/answer tasks. Per chart notes: ongoing Acute hepatic encephalopathy secondary to noncompliant with lactulose in setting of chronic ETOH cirrhosis decompensation was the dx at admit. Per chart and pt's report, pt lives w/ daughter -- unsure if he receives help w/ management of medications, bills, driving etc.   Pt on RA, afebrile. WBC WNL.   Pt awake, verbal and followed directions w/ MOD+ cues. Pt exhibited confusion and was slow to respond to verbal questions/conversation w/ NSG/SLP and Interpreter -- at times the Interpreter had to repeat herself to pt.   Pt presents with Mod-Severe Cognitive-communication impairment characterized by deficits in the areas of attention, focus, memory/recall, problem-solving, judgement, and executive functioning per questions/answers during informal assessment -- unsure if impact from ongoing hepatic encephalopathy secondary to noncompliant with lactulose in setting of chronic ETOH cirrhosis decompensation(admit dx). Deficits may impact ability to independently manage finances, medications, decision-making in the home, and maintain safety in the home/work environment. Unsure of pt's Baseline Cognitive functioning in ADLs tasks baseline. Pt was able to eventually ID his full name, that he lived in "[address] in [address] trailer", he lived w/ his youngest Dtr who is 55yo, he has other children who are married. He could not ID place/time or why he came to the hospital. Suspect his Cognitive decline is impacting his expressive/receptive Language abilities. No overt Motor Speech deficits noted.  With Head CT revealing: "No evidence of acute infarction, hemorrhage, hydrocephalus,  extra-axial collection or mass lesion/mass effect; Sequela of mild chronic microvascular ischemic change", and current presentation at this time, suspect need for f/u for ongoing Cognitive-communication needs post GI procedure/blood and Time for the Encephalopathy to improve. Pt would require Supervision in the home and during ADLs for safety. F/u for Cognitive intervention at next venue of care could be beneficial for formal assessment of Cognitive ADLs in his home environment. First, would strongly recommend f/u w/ Neurology for formal assessment of pt's Cognitive function/status in setting of comorbidities.    ST services will monitor pt's status in 2-3 days for further needs/assessment. Recommend skilled ST services in the Outpatient setting as determined needed. The above was discussed w/ MD/NSG; MD in agreement w/ plan.     SLP Assessment  SLP Recommendation/Assessment: All further Speech Lanaguage Pathology  needs can be addressed in the next venue of care SLP Visit Diagnosis: Cognitive communication deficit (R41.841)    Recommendations for follow up therapy are one component of a multi-disciplinary discharge planning process, led by the attending physician.  Recommendations may be updated based on patient status, additional functional criteria and insurance authorization.    Follow Up Recommendations  Follow physician's recommendations for discharge plan and follow up therapies    Assistance Recommended at Discharge  Frequent or constant Supervision/Assistance  Functional Status Assessment Patient has had a recent decline in their functional status and/or demonstrates limited ability to make significant improvements in function in a reasonable and predictable amount of time  Frequency and Duration min 1 x/week  1 week      SLP Evaluation Cognition  Overall Cognitive Status: No family/caregiver present to determine baseline cognitive functioning (difficult to determine what was  baseline) Arousal/Alertness: Awake/alert Orientation Level:  Oriented to person;Disoriented to place;Disoriented to time;Disoriented to situation (name seemed confusing to him initially -- he stated "[place]" as part of his name (this is not listed). NSG made aware.) Year:  (did not answer) Attention: Focused;Sustained Focused Attention: Impaired Focused Attention Impairment: Verbal basic;Functional basic Sustained Attention: Impaired Sustained Attention Impairment: Verbal basic;Functional basic Memory: Impaired Memory Impairment: Retrieval deficit Awareness: Impaired Awareness Impairment: Anticipatory impairment Problem Solving: Impaired Problem Solving Impairment: Verbal basic;Functional basic Executive Function: Reasoning Reasoning: Impaired Reasoning Impairment: Verbal basic;Functional basic Behaviors:  (none) Safety/Judgment: Impaired Comments: could not ID [address] Scales of Cognitive Functioning: Confused, Inappropriate Non-Agitated: Maximal Assistance       Comprehension  Auditory Comprehension Overall Auditory Comprehension: Impaired Visual Recognition/Discrimination Discrimination: Not tested Reading Comprehension Reading Status: Not tested    Expression Expression Primary Mode of Expression: Verbal Verbal Expression Overall Verbal Expression: Impaired Written Expression Written Expression: Not tested   Oral / Motor  Oral Motor/Sensory Function Overall Oral Motor/Sensory Function: Within functional limits Motor Speech Overall Motor Speech: Appears within functional limits for tasks assessed Respiration: Within functional limits Phonation: Normal Resonance: Within functional limits Articulation: Within functional limitis Intelligibility: Intelligible              Jerilynn Som, MS, CCC-SLP Speech Language Pathologist Rehab Services; [facility] [phone] (ascom) Eisa Conaway 09/09/2023, 1:00 PM

## 2023-09-09 NOTE — Assessment & Plan Note (Signed)
Seems chronic likely with liver cirrhosis. -Continue to monitor

## 2023-09-09 NOTE — Assessment & Plan Note (Signed)
Patient with history of alcoholic cirrhosis being followed up at [facility]. Increased somnolence and altered mental status, concern for hepatic encephalopathy with elevated ammonia.  Slowly improving -Continue with lactulose -Continue with rifaximin -GI was consulted

## 2023-09-09 NOTE — Assessment & Plan Note (Signed)
Decompensated alcoholic liver cirrhosis, worsening T. bili and INR. S/p outpatient paracentesis with removal of 10 L and a diagnostic paracentesis in ED with cultures currently negative.  Patient follow-up with [facility] GI and is being considered for TIPS procedure. -GI is on board -Continue with supportive care -Continue with prophylactic SBP treatment -Another paracentesis ordered due to worsening abdominal distention

## 2023-09-09 NOTE — H&P (View-Only) (Signed)
GI Inpatient Follow-up Note  Subjective:  Patient seen in follow-up for UGIB/decompensated cirrhosis. No acute events overnight. No family bedside. EGD planned for today was cancelled due to not having consent from daughter. 5+ attempts were made this morning to contact daughter to obtain consent. Hemoglobin 8.4 this AM.   Scheduled Inpatient Medications:   sodium chloride   Intravenous Once   carvedilol  6.25 mg Oral BID   feeding supplement  237 mL Oral TID BM   furosemide  40 mg Oral Daily   lactulose  30 g Oral TID   pantoprazole (PROTONIX) IV  40 mg Intravenous Q8H   Followed by   Melene Muller ON 09/11/2023] pantoprazole (PROTONIX) IV  40 mg Intravenous Q12H   rifaximin  550 mg Oral BID   sodium chloride flush  10 mL Intravenous Q12H   spironolactone  100 mg Oral Daily   tamsulosin  0.4 mg Oral Daily   thiamine  100 mg Oral Daily    Continuous Inpatient Infusions:    cefTRIAXone (ROCEPHIN)  IV 2 g (09/08/23 2347)   octreotide (SANDOSTATIN) 500 mcg in sodium chloride 0.9 % 250 mL (2 mcg/mL) infusion 50 mcg/hr (09/09/23 0746)   promethazine (PHENERGAN) injection (IM or IVPB)      PRN Inpatient Medications:  acetaminophen **OR** acetaminophen, labetalol, ondansetron **OR** ondansetron (ZOFRAN) IV, promethazine (PHENERGAN) injection (IM or IVPB)  Review of Systems:  Unable to obtain 2/2 patient's encephalopathy    Physical Examination: BP 119/67 (BP Location: Right Arm)   Pulse 78   Temp 98.1 F (36.7 C)   Resp 20   Ht 5\' 2"  (1.575 m)   Wt 74.5 kg   SpO2 98%   BMI 30.04 kg/m  Gen: NAD, alert, somnolent, no acute distress HEENT: PEERLA, EOMI, Neck: supple, no JVD or thyromegaly Chest: CTA bilaterally, no wheezes, crackles, or other adventitious sounds CV: RRR, no m/g/c/r Abd: soft, mildly distended, +BS in all four quadrants; nontender to palpation in all quadrants, no HSM, guarding, ridigity, or rebound tenderness Ext: no edema, well perfused with 2+ pulses, Skin:  no rash or lesions noted Lymph: no LAD  Data: Lab Results  Component Value Date   WBC 9.1 09/08/2023   HGB 8.4 (L) 09/09/2023   HCT 22.9 (L) 09/09/2023   MCV 97.0 09/08/2023   PLT 118 (L) 09/08/2023   Recent Labs  Lab 09/08/23 1753 09/08/23 2331 09/09/23 0636  HGB 9.4* 8.6* 8.4*   Lab Results  Component Value Date   NA 137 09/08/2023   K 3.4 (L) 09/08/2023   CL 110 09/08/2023   CO2 17 (L) 09/08/2023   BUN 24 (H) 09/08/2023   CREATININE 1.25 (H) 09/08/2023   Lab Results  Component Value Date   ALT 17 09/08/2023   AST 39 09/08/2023   ALKPHOS 83 09/08/2023   BILITOT 4.9 (H) 09/08/2023   Recent Labs  Lab 09/07/23 1611 09/08/23 0539  APTT 39*  --   INR 2.0* 2.3*    Assessment/Plan:  [name] with a PMH of MetALD cirrhosis c/b esophageal varices, diuretic-resistant ascites requiring therapeutic LVP q2 weeks, and hepatic encephalopathy, T2DM, and hx of alcohol abuse in remission presented to the [facility] ED via EMS 10/17 for chief complaint of altered mental status suspected 2/2 hepatic encephalopathy. GI consulted for concerns of UGIB.    UGIB - DDx includes esophageal variceal bleed, PUD, gastritis, Dieulafoy's lesion, GAVE, erosive esophagitis, malignancy, polyp, etc. Reportedly had 1 large episode of hematemesis this morning. H&H  stable.    Decompensated cirrhosis c/b esophageal varices, diuretic-resistant refractory ascites requiring LVP q2 weeks, and hepatic encephalopathy  MELD 3.0 33 points    Hepatic encephalopathy    Coagulopathic   Hx of esophageal variceal bleeding 09/2021, 11/2021   Portal Hypertension with sequelae   Hx of medication noncompliance   Recommendations:   - Maintain 2 large bore IVs for access - Continue to monitor serial H&H. Transfuse for Hgb <7.0.  - Continue Ceftriaxone 2 gram every 24 hours IV for concerns of GIB - Continue Octreotide gtt - Continue Protonix 40 mg IV BID for gastric protection - Avoid NSAIDs. Hold  DVT prophylaxis.  - Avoid frequent lab draws to prevent lab induced anemia - Monitor CBC, CMP, and INR daily - Continue to monitor for signs of GI bleeding. No overt GI blood loss at this time - Administer Vitamin K and FFP for coagulopathy. Goal INR 2.0 or less.  - Advise EGD tomorrow AM with Dr. Norma Fredrickson +/- endoscopic hemostasis. I have discussed with nursing team importance of obtaining written consent from daughter. Multiple attempts made to reach daughter this AM have been unsuccessful.  - NPO - GI following along with you   Please call with questions or concerns.   [name], PA-C [facility] Gastroenterology [phone]

## 2023-09-09 NOTE — Progress Notes (Signed)
Patient is alert and oriented X 1, with confusion. 6 units of FFP transfusion done. Pt tolerated well. No any symptoms of  adverse reaction seen.plan of care ongoing.

## 2023-09-09 NOTE — Plan of Care (Signed)

## 2023-09-09 NOTE — Progress Notes (Signed)
GI Inpatient Follow-up Note  Subjective:  Patient seen in follow-up for UGIB/decompensated cirrhosis. No acute events overnight. No family bedside. EGD planned for today was cancelled due to not having consent from daughter. 5+ attempts were made this morning to contact daughter to obtain consent. Hemoglobin 8.4 this AM.   Scheduled Inpatient Medications:   sodium chloride   Intravenous Once   carvedilol  6.25 mg Oral BID   feeding supplement  237 mL Oral TID BM   furosemide  40 mg Oral Daily   lactulose  30 g Oral TID   pantoprazole (PROTONIX) IV  40 mg Intravenous Q8H   Followed by   Melene Muller ON 09/11/2023] pantoprazole (PROTONIX) IV  40 mg Intravenous Q12H   rifaximin  550 mg Oral BID   sodium chloride flush  10 mL Intravenous Q12H   spironolactone  100 mg Oral Daily   tamsulosin  0.4 mg Oral Daily   thiamine  100 mg Oral Daily    Continuous Inpatient Infusions:    cefTRIAXone (ROCEPHIN)  IV 2 g (09/08/23 2347)   octreotide (SANDOSTATIN) 500 mcg in sodium chloride 0.9 % 250 mL (2 mcg/mL) infusion 50 mcg/hr (09/09/23 0746)   promethazine (PHENERGAN) injection (IM or IVPB)      PRN Inpatient Medications:  acetaminophen **OR** acetaminophen, labetalol, ondansetron **OR** ondansetron (ZOFRAN) IV, promethazine (PHENERGAN) injection (IM or IVPB)  Review of Systems:  Unable to obtain 2/2 patient's encephalopathy    Physical Examination: BP 119/67 (BP Location: Right Arm)   Pulse 78   Temp 98.1 F (36.7 C)   Resp 20   Ht 5\' 2"  (1.575 m)   Wt 74.5 kg   SpO2 98%   BMI 30.04 kg/m  Gen: NAD, alert, somnolent, no acute distress HEENT: PEERLA, EOMI, Neck: supple, no JVD or thyromegaly Chest: CTA bilaterally, no wheezes, crackles, or other adventitious sounds CV: RRR, no m/g/c/r Abd: soft, mildly distended, +BS in all four quadrants; nontender to palpation in all quadrants, no HSM, guarding, ridigity, or rebound tenderness Ext: no edema, well perfused with 2+ pulses, Skin:  no rash or lesions noted Lymph: no LAD  Data: Lab Results  Component Value Date   WBC 9.1 09/08/2023   HGB 8.4 (L) 09/09/2023   HCT 22.9 (L) 09/09/2023   MCV 97.0 09/08/2023   PLT 118 (L) 09/08/2023   Recent Labs  Lab 09/08/23 1753 09/08/23 2331 09/09/23 0636  HGB 9.4* 8.6* 8.4*   Lab Results  Component Value Date   NA 137 09/08/2023   K 3.4 (L) 09/08/2023   CL 110 09/08/2023   CO2 17 (L) 09/08/2023   BUN 24 (H) 09/08/2023   CREATININE 1.25 (H) 09/08/2023   Lab Results  Component Value Date   ALT 17 09/08/2023   AST 39 09/08/2023   ALKPHOS 83 09/08/2023   BILITOT 4.9 (H) 09/08/2023   Recent Labs  Lab 09/07/23 1611 09/08/23 0539  APTT 39*  --   INR 2.0* 2.3*    Assessment/Plan:  [name] with a PMH of MetALD cirrhosis c/b esophageal varices, diuretic-resistant ascites requiring therapeutic LVP q2 weeks, and hepatic encephalopathy, T2DM, and hx of alcohol abuse in remission presented to the [facility] ED via EMS 10/17 for chief complaint of altered mental status suspected 2/2 hepatic encephalopathy. GI consulted for concerns of UGIB.    UGIB - DDx includes esophageal variceal bleed, PUD, gastritis, Dieulafoy's lesion, GAVE, erosive esophagitis, malignancy, polyp, etc. Reportedly had 1 large episode of hematemesis this morning. H&H  stable.    Decompensated cirrhosis c/b esophageal varices, diuretic-resistant refractory ascites requiring LVP q2 weeks, and hepatic encephalopathy  MELD 3.0 33 points    Hepatic encephalopathy    Coagulopathic   Hx of esophageal variceal bleeding 09/2021, 11/2021   Portal Hypertension with sequelae   Hx of medication noncompliance   Recommendations:   - Maintain 2 large bore IVs for access - Continue to monitor serial H&H. Transfuse for Hgb <7.0.  - Continue Ceftriaxone 2 gram every 24 hours IV for concerns of GIB - Continue Octreotide gtt - Continue Protonix 40 mg IV BID for gastric protection - Avoid NSAIDs. Hold  DVT prophylaxis.  - Avoid frequent lab draws to prevent lab induced anemia - Monitor CBC, CMP, and INR daily - Continue to monitor for signs of GI bleeding. No overt GI blood loss at this time - Administer Vitamin K and FFP for coagulopathy. Goal INR 2.0 or less.  - Advise EGD tomorrow AM with Dr. Norma Fredrickson +/- endoscopic hemostasis. I have discussed with nursing team importance of obtaining written consent from daughter. Multiple attempts made to reach daughter this AM have been unsuccessful.  - NPO - GI following along with you   Please call with questions or concerns.   [name], PA-C [facility] [phone]

## 2023-09-09 NOTE — Progress Notes (Signed)
Progress Note   Patient: Shawn Torres [id number] DOB: Jul 15, 1966 DOA: 09/07/2023     2 DOS: the patient was seen and examined on 09/09/2023   Brief hospital course: Taken from H&P.   Woodward Teply is a 57 y.o. male with medical history significant of alcoholic cirrhosis, recurrent hepatic encephalopathy, IIDM, brought in by family member for evaluation of confusion.  Over the past 7 to 8 days patient with gradually worsening ascites, poor appetite and p.o. intake, mild nausea while taking lactulose so he stopped taking lactulose. Patient follow-up with [facility] for his cirrhosis and had his outpatient paracentesis done with removal of 10 L of fluid.  Family found him more sleepy and less responsive this morning so they brought him to ED.  On presentation patient was afebrile, had mild tachycardia, blood pressure mildly elevated.  Chest x-ray with no acute infiltrate.  Labs pertinent for ammonia of 174, AST, ALT normal, albumin 2.8, T. bili 4.6 and INR 2.0.  Lactic acid 2.2.  Patient was started on broad-spectrum antibiotic with cefepime and Flagyl.  Diagnostic paracentesis was done.  10/18: Vital stable, preliminary blood and peritoneal fluid cultures are negative, CMP with potassium of 3.4, bicarb 17, slight increase in creatinine to 1.24 with baseline around 1.1, T. bili 4.9 and INR of 2.3.  Patient had 2 episodes of hematemesis, GI was consulted and 6 units of FFP was ordered at their request as he will be going for EGD tomorrow.  Started on PPI and octreotide.  10/19: No more hematemesis, hemoglobin at 8.4.  Going for EGD later today.  Some improvement in mentation but still not at baseline.  Gradually worsening abdominal distention   Assessment and Plan: * Hepatic encephalopathy [facility]) Patient with history of alcoholic cirrhosis being followed up at [facility]. Increased somnolence and altered mental status, concern for hepatic encephalopathy with elevated  ammonia.  Slowly improving -Continue with lactulose -Continue with rifaximin -GI was consulted  Hematemesis Patient had 2 episode of hematemesis yesterday, no hematemesis today.  History of variceal bleed requiring multiple procedures and banding in the past.  Hemoglobin at 8.4.  GI was consulted and going for EGD later today -6 units of FFP was ordered for today as recommended by GI -Monitor hemoglobin -Continue octreotide and PPI  Alcoholic cirrhosis of liver with ascites (HCC) Decompensated alcoholic liver cirrhosis, worsening T. bili and INR. S/p outpatient paracentesis with removal of 10 L and a diagnostic paracentesis in ED with cultures currently negative.  Patient follow-up with [facility] GI and is being considered for TIPS procedure. -GI is on board -Continue with supportive care -Continue with prophylactic SBP treatment  Lactic acidosis Seems chronic likely with liver cirrhosis. -Continue to monitor  Malnutrition of moderate degree Dietary supplement.  BPH (benign prostatic hyperplasia) -Continue with flomax   Subjective: Patient more alert but still not at baseline today.  No more hematemesis.  A Spanish interpreter was used for communication.  Physical Exam: Vitals:   09/09/23 0450 09/09/23 0600 09/09/23 0846 09/09/23 1329  BP: 131/85 122/76 119/67 122/76  Pulse: 99 91 78 74  Resp: 18 18 20 18   Temp: 98.3 F (36.8 C) 98.6 F (37 C) 98.1 F (36.7 C) 98 F (36.7 C)  TempSrc: Oral Oral  Oral  SpO2: 100% 98% 98% 98%  Weight:      Height:       General.  Frail and malnourished gentleman, in no acute distress. Pulmonary.  Lungs clear bilaterally, normal respiratory effort. CV.  Regular rate and rhythm, no JVD, rub or murmur. Abdomen.  Soft, nontender, distended, BS positive. CNS.  Alert and oriented .  No focal neurologic deficit. Extremities.  No edema, no cyanosis, pulses intact and symmetrical.   Data Reviewed: Prior data reviewed.  Family Communication:  Discussed with daughter on phone  Disposition: Status is: Inpatient Remains inpatient appropriate because: Severity of illness  Planned Discharge Destination: Home  Time spent: 50 minutes  This record has been created using Conservation officer, historic buildings. Errors have been sought and corrected,but may not always be located. Such creation errors do not reflect on the standard of care.   Author: Arnetha Courser, MD 09/09/2023 4:29 PM  For on call review [email or web address].

## 2023-09-09 NOTE — Progress Notes (Signed)
Patient assessment and medication done with the help of online interpreter [place], [phone].

## 2023-09-10 ENCOUNTER — Inpatient Hospital Stay: Payer: Medicaid Other

## 2023-09-10 ENCOUNTER — Inpatient Hospital Stay: Payer: Medicaid Other | Admitting: Anesthesiology

## 2023-09-10 ENCOUNTER — Encounter
Admission: EM | Disposition: A | Discharge status: Home / Self Care | Payer: Self-pay | Source: Home / Self Care | Attending: Internal Medicine

## 2023-09-10 HISTORY — PX: ESOPHAGOGASTRODUODENOSCOPY (EGD) WITH PROPOFOL: SHX5813

## 2023-09-10 LAB — CBC
HCT: 26.4 % — ABNORMAL LOW (ref 39.0–52.0)
Hemoglobin: 9 g/dL — ABNORMAL LOW (ref 13.0–17.0)
MCH: 32.8 pg (ref 26.0–34.0)
MCHC: 34.1 g/dL (ref 30.0–36.0)
MCV: 96.4 fL (ref 80.0–100.0)
Platelets: 99 10*3/uL — ABNORMAL LOW (ref 150–400)
RBC: 2.74 MIL/uL — ABNORMAL LOW (ref 4.22–5.81)
RDW: 14.9 % (ref 11.5–15.5)
WBC: 5.7 10*3/uL (ref 4.0–10.5)
nRBC: 0 % (ref 0.0–0.2)

## 2023-09-10 LAB — COMPREHENSIVE METABOLIC PANEL
ALT: 14 U/L (ref 0–44)
AST: 21 U/L (ref 15–41)
Albumin: 2.3 g/dL — ABNORMAL LOW (ref 3.5–5.0)
Alkaline Phosphatase: 73 U/L (ref 38–126)
Anion gap: 8 (ref 5–15)
BUN: 32 mg/dL — ABNORMAL HIGH (ref 6–20)
CO2: 19 mmol/L — ABNORMAL LOW (ref 22–32)
Calcium: 7.6 mg/dL — ABNORMAL LOW (ref 8.9–10.3)
Chloride: 111 mmol/L (ref 98–111)
Creatinine, Ser: 1.25 mg/dL — ABNORMAL HIGH (ref 0.61–1.24)
GFR, Estimated: >60 mL/min (ref >60)
Glucose, Bld: 111 mg/dL — ABNORMAL HIGH (ref 70–99)
Potassium: 3.8 mmol/L (ref 3.5–5.1)
Sodium: 138 mmol/L (ref 135–145)
Total Bilirubin: 2.9 mg/dL — ABNORMAL HIGH (ref 0.3–1.2)
Total Protein: 6.2 g/dL — ABNORMAL LOW (ref 6.5–8.1)

## 2023-09-10 LAB — URINALYSIS, W/ REFLEX TO CULTURE (INFECTION SUSPECTED)
Bilirubin Urine: NEGATIVE
Glucose, UA: NEGATIVE mg/dL
Hgb urine dipstick: NEGATIVE
Ketones, ur: 5 mg/dL — AB
Leukocytes,Ua: NEGATIVE
Nitrite: NEGATIVE
Protein, ur: NEGATIVE mg/dL
Specific Gravity, Urine: 1.024 (ref 1.005–1.030)
pH: 5 (ref 5.0–8.0)

## 2023-09-10 LAB — BPAM FFP
Blood Product Expiration Date: 202410232359
Blood Product Expiration Date: 202410242359
Blood Product Expiration Date: 202410242359
Blood Product Unit Number: 202410232359
Blood Product Unit Number: 202410232359
ISSUE DATE / TIME: 202410191058
ISSUE DATE / TIME: 202410191205
ISSUE DATE / TIME: 202410190556
ISSUE DATE / TIME: 202410232359
PRODUCT CODE: 202410190832
PRODUCT CODE: 202410232359
PRODUCT CODE: 202410232359
Unit Type and Rh: 5100
Unit Type and Rh: 5100
Unit Type and Rh: 5100
Unit Type and Rh: 202410190943
Unit Type and Rh: 202410232359
Unit Type and Rh: 202410232359
Unit Type and Rh: 5100
Unit Type and Rh: 6200
Unit Type and Rh: 6200
Unit Type and Rh: 6200

## 2023-09-10 LAB — PREPARE FRESH FROZEN PLASMA
Unit division: 0
Unit division: 0
Unit division: 0

## 2023-09-10 LAB — GLUCOSE, CAPILLARY: Glucose-Capillary: 107 mg/dL — ABNORMAL HIGH (ref 70–99)

## 2023-09-10 LAB — PROTIME-INR
INR: 1.8 — ABNORMAL HIGH (ref 0.8–1.2)
Prothrombin Time: 21.5 s — ABNORMAL HIGH (ref 11.4–15.2)

## 2023-09-10 SURGERY — ESOPHAGOGASTRODUODENOSCOPY (EGD) WITH PROPOFOL
Anesthesia: General

## 2023-09-10 MED ORDER — LIDOCAINE HCL (CARDIAC) PF 100 MG/5ML IV SOSY
PREFILLED_SYRINGE | INTRAVENOUS | Status: DC | PRN
  Administered 2023-09-10: 100 mg via INTRAVENOUS

## 2023-09-10 MED ORDER — ENSURE ENLIVE PO LIQD
237.0000 mL | Freq: Three times a day (TID) | ORAL | Status: DC
  Administered 2023-09-10 – 2023-09-11 (×2): 237 mL via ORAL

## 2023-09-10 MED ORDER — LIDOCAINE HCL (PF) 2 % IJ SOLN
INTRAMUSCULAR | Status: AC
  Filled 2023-09-10: qty 10

## 2023-09-10 MED ORDER — SODIUM CHLORIDE 0.9% FLUSH: 10.0000 mL | Freq: Two times a day (BID) | INTRAVENOUS | Status: DC

## 2023-09-10 MED ORDER — ALBUMIN HUMAN 25 % IV SOLN
50.0000 g | Freq: Once | INTRAVENOUS | Status: AC
  Administered 2023-09-10: 50 g via INTRAVENOUS
  Filled 2023-09-10: qty 200

## 2023-09-10 MED ORDER — SODIUM CHLORIDE 0.9 % IV SOLN: INTRAVENOUS | Status: DC

## 2023-09-10 MED ORDER — BENZONATATE 100 MG PO CAPS
100.0000 mg | ORAL_CAPSULE | Freq: Two times a day (BID) | ORAL | Status: DC | PRN
  Administered 2023-09-10: 100 mg via ORAL
  Filled 2023-09-10: qty 1

## 2023-09-10 MED ORDER — PROPOFOL 1000 MG/100ML IV EMUL
INTRAVENOUS | Status: AC
  Filled 2023-09-10: qty 100

## 2023-09-10 MED ORDER — PROPOFOL 10 MG/ML IV BOLUS
INTRAVENOUS | Status: DC | PRN
  Administered 2023-09-10: 100 mg via INTRAVENOUS
  Administered 2023-09-10: 40 mg via INTRAVENOUS

## 2023-09-10 MED ORDER — SODIUM CHLORIDE 0.9 % IV SOLN: INTRAVENOUS | Status: DC | PRN

## 2023-09-10 MED ORDER — METHYLPREDNISOLONE SODIUM SUCC 125 MG IJ SOLR
INTRAMUSCULAR | Status: DC | PRN
  Administered 2023-09-10: 125 mg via INTRAVENOUS

## 2023-09-10 NOTE — Op Note (Signed)
[facility], [place] Gastroenterology Patient Name: Shawn Torres Procedure Date: 09/10/2023 1:24 PM MRN: [id number] Account #: [id number] Date of Birth: July 04, 1966 Admit Type: Inpatient Age: 57 Room: [facility] ENDO ROOM 4 Gender: Male Note Status: Finalized Instrument Name: Upper Endoscope [id number] Procedure:             Upper GI endoscopy Indications:           Hematemesis, Cirrhosis with UGI bleeding suspected                         esophageal varices Providers:             Boykin Nearing. Norma Fredrickson MD, MD Referring MD:          Talbert Forest, MD Medicines:             Propofol per Anesthesia Complications:         No immediate complications. Estimated blood loss: None. Procedure:             Pre-Anesthesia Assessment:                        - The risks and benefits of the procedure and the                         sedation options and risks were discussed with the                         patient. All questions were answered and informed                         consent was obtained.                        - Patient identification and proposed procedure were                         verified prior to the procedure by the nurse. The                         procedure was verified in the procedure room.                        - ASA Grade Assessment: III - A patient with severe                         systemic disease.                        - After reviewing the risks and benefits, the patient                         was deemed in satisfactory condition to undergo the                         procedure.                        After obtaining informed consent, the endoscope was  passed under direct vision. Throughout the procedure,                         the patient's blood pressure, pulse, and oxygen                         saturations were monitored continuously. The Endoscope                         was introduced through the mouth, and advanced to the                          third part of duodenum. The upper GI endoscopy was                         accomplished without difficulty. The patient tolerated                         the procedure well. The patient tolerated the                         procedure fairly well. Findings:      LA Grade C (one or more mucosal breaks continuous between tops of 2 or       more mucosal folds, less than 75% circumference) esophagitis with no       bleeding was found in the lower third of the esophagus. Estimated blood       loss: none.      Grade I varices were found in the lower third of the esophagus. There       were no stigmata of active or recent bleeding from the esophageal       varices. Estimated blood loss: none.      Retained fluid was found in the cardia, in the gastric fundus and in the       gastric body. Fluid aspiration was performed.      There is no endoscopic evidence of bleeding, ulceration or varices in       the entire examined stomach.      Mild portal hypertensive gastropathy was found in the entire examined       stomach.      The examined duodenum was normal.      The exam was otherwise without abnormality. Impression:            - LA Grade C chronic esophagitis with no bleeding.                        - Grade I esophageal varices.                        - Retained gastric fluid. Fluid aspiration performed.                        - Portal hypertensive gastropathy.                        - Normal examined duodenum.                        - The examination was otherwise normal. Recommendation:        -  Return patient to hospital ward for ongoing care.                        - Advance diet as tolerated.                        - Continue present medications.                        - Discontinue octreotide.                        - Continue IV PPI                        - Follow expectantly Procedure Code(s):     --- Professional ---                        [phone],  Esophagogastroduodenoscopy, flexible,                         transoral; diagnostic, including collection of                         specimen(s) by brushing or washing, when performed                         (separate procedure) Diagnosis Code(s):     --- Professional ---                        K92.2, Gastrointestinal hemorrhage, unspecified                        K74.60, Unspecified cirrhosis of liver                        K92.0, Hematemesis                        K31.89, Other diseases of stomach and duodenum                        K76.6, Portal hypertension                        I85.10, Secondary esophageal varices without bleeding                        K20.90, Esophagitis, unspecified without bleeding CPT copyright 2022 American Medical Association. All rights reserved. The codes documented in this report are preliminary and upon coder review may  be revised to meet current compliance requirements. Stanton Kidney MD, MD 09/10/2023 1:48:02 PM This report has been signed electronically. Number of Addenda: 0 Note Initiated On: 09/10/2023 1:24 PM Estimated Blood Loss:  Estimated blood loss: none.      [facility]

## 2023-09-10 NOTE — Progress Notes (Signed)
Pt received from procedure via bed in stable condition.

## 2023-09-10 NOTE — Progress Notes (Signed)
Progress Note   Patient: Shawn Torres [id number]:[id number] DOB: Jan 09, 1966 DOA: 09/07/2023     3 DOS: the patient was seen and examined on 09/10/2023   Brief hospital course: Taken from H&P.   Lamond Rorie is a 57 y.o. male with medical history significant of alcoholic cirrhosis, recurrent hepatic encephalopathy, IIDM, brought in by family member for evaluation of confusion.  Over the past 7 to 8 days patient with gradually worsening ascites, poor appetite and p.o. intake, mild nausea while taking lactulose so he stopped taking lactulose. Patient follow-up with [facility] for his cirrhosis and had his outpatient paracentesis done with removal of 10 L of fluid.  Family found him more sleepy and less responsive this morning so they brought him to ED.  On presentation patient was afebrile, had mild tachycardia, blood pressure mildly elevated.  Chest x-ray with no acute infiltrate.  Labs pertinent for ammonia of 174, AST, ALT normal, albumin 2.8, T. bili 4.6 and INR 2.0.  Lactic acid 2.2.  Patient was started on broad-spectrum antibiotic with cefepime and Flagyl.  Diagnostic paracentesis was done.  10/18: Vital stable, preliminary blood and peritoneal fluid cultures are negative, CMP with potassium of 3.4, bicarb 17, slight increase in creatinine to 1.24 with baseline around 1.1, T. bili 4.9 and INR of 2.3.  Patient had 2 episodes of hematemesis, GI was consulted and 6 units of FFP was ordered at their request as he will be going for EGD tomorrow.  Started on PPI and octreotide.  10/19: No more hematemesis, hemoglobin at 8.4.  Going for EGD later today.  Some improvement in mentation but still not at baseline.  Gradually worsening abdominal distention.  10/20: Patient underwent EGD which shows some esophagitis, and nonbleeding varices.  Sign of portal hypertension.  Worsening abdominal distention, ordered paracentesis with albumin and likely can go back home tomorrow as mentation is not at  baseline.  Patient is being considered for TIPS procedure at [facility] where he will follow-up from here.   Assessment and Plan: * Hepatic encephalopathy [facility]) Patient with history of alcoholic cirrhosis being followed up at [facility]. Increased somnolence and altered mental status, concern for hepatic encephalopathy with elevated ammonia.  Much improved, now at baseline -Continue with lactulose -Continue with rifaximin -GI was consulted  Hematemesis Patient had 2 episode of hematemesis yesterday, no hematemesis today.  History of variceal bleed requiring multiple procedures and banding in the past.  Hemoglobin at 8.4.  GI was consulted s/p EGD today with concern of esophagitis, portal hypertensive gastropathy and grade 1 varices with no active bleeding. -6 units of FFP was ordered for today as recommended by GI -Monitor hemoglobin -Octreotide can be discontinued as there was no active variceal bleed -Continue PPI  Alcoholic cirrhosis of liver with ascites (HCC) Decompensated alcoholic liver cirrhosis, worsening T. bili and INR. S/p outpatient paracentesis with removal of 10 L and a diagnostic paracentesis in ED with cultures currently negative.  Patient follow-up with [facility] GI and is being considered for TIPS procedure. -GI is on board -Continue with supportive care -Continue with prophylactic SBP treatment -Another paracentesis ordered due to worsening abdominal distention  Lactic acidosis Seems chronic likely with liver cirrhosis. -Continue to monitor  Malnutrition of moderate degree Dietary supplement.  BPH (benign prostatic hyperplasia) -Continue with flomax   Subjective: Patient was seen and examined today.  Talking much more clearly and apprehending well.  Per daughter he seems at baseline.  Daughter helped with interpretation.  Physical Exam: Vitals:  09/10/23 1401 09/10/23 1406 09/10/23 1421 09/10/23 1445  BP: 108/70 120/79 130/82 119/81  Pulse:  82 82 86  Resp:   (!) 29 18 18   Temp: (!) 97.5 F (36.4 C)   98 F (36.7 C)  TempSrc: Temporal   Oral  SpO2: 100% 100% 100% 97%  Weight:      Height:       General.  Frail and malnourished gentleman, in no acute distress. Pulmonary.  Lungs clear bilaterally, normal respiratory effort. CV.  Regular rate and rhythm, no JVD, rub or murmur. Abdomen.  Soft, nontender, distended, BS positive. CNS.  Alert and oriented .  No focal neurologic deficit. Extremities.  No edema, no cyanosis, pulses intact and symmetrical. Psychiatry.  Judgment and insight appears normal.    Data Reviewed: Prior data reviewed.  Family Communication: Discussed with daughter.  Disposition: Status is: Inpatient Remains inpatient appropriate because: Severity of illness  Planned Discharge Destination: Home  Time spent: 50 minutes  This record has been created using Conservation officer, historic buildings. Errors have been sought and corrected,but may not always be located. Such creation errors do not reflect on the standard of care.   Author: Arnetha Courser, MD 09/10/2023 3:53 PM  For on call review [email or web address].

## 2023-09-10 NOTE — Plan of Care (Signed)

## 2023-09-10 NOTE — Transfer of Care (Signed)
Immediate Anesthesia Transfer of Care Note  Patient: Shawn Torres  Procedure(s) Performed: ESOPHAGOGASTRODUODENOSCOPY (EGD) WITH PROPOFOL  Patient Location: Endoscopy Unit  Anesthesia Type:General  Level of Consciousness: drowsy  Airway & Oxygen Therapy: Patient Spontanous Breathing and Patient connected to face mask oxygen  Post-op Assessment: Report given to RN, Post -op Vital signs reviewed and stable, and Patient moving all extremities  Post vital signs: Reviewed and stable  Last Vitals:  Vitals Value Taken Time  BP 120/79 09/10/23 1406  Temp 36.4 C 09/10/23 1401  Pulse 83 09/10/23 1407  Resp 23 09/10/23 1407  SpO2 100 % 09/10/23 1407  Vitals shown include unfiled device data.  Last Pain:  Vitals:   09/10/23 1406  TempSrc:   PainSc: Asleep         Complications: No notable events documented.

## 2023-09-10 NOTE — Interval H&P Note (Signed)
History and Physical Interval Note:  09/10/2023 1:26 PM  Shawn Torres  has presented today for surgery, with the diagnosis of Hematemesis, upper GI bleed, decompensated cirrhosis.  The various methods of treatment have been discussed with the patient and family. After consideration of risks, benefits and other options for treatment, the patient has consented to  Procedure(s): ESOPHAGOGASTRODUODENOSCOPY (EGD) WITH PROPOFOL (N/A) as a surgical intervention.  The patient's history has been reviewed, patient examined, no change in status, stable for surgery.  I have reviewed the patient's chart and labs.  Questions were answered to the patient's satisfaction.     [place], [place]

## 2023-09-10 NOTE — Progress Notes (Signed)
Patient sent to EGD via bed in stable condition.

## 2023-09-10 NOTE — Anesthesia Preprocedure Evaluation (Addendum)
Anesthesia Evaluation  Patient identified by MRN, date of birth, ID band Patient awake    Reviewed: Allergy & Precautions, NPO status , Patient's Chart, lab work & pertinent test results  History of Anesthesia Complications Negative for: history of anesthetic complications  Airway Mallampati: III   Neck ROM: Full    Dental  (+) Chipped   Pulmonary neg pulmonary ROS   Pulmonary exam normal breath sounds clear to auscultation       Cardiovascular Normal cardiovascular exam Rhythm:Regular Rate:Normal  ECG 09/07/23:  Atrial flutter with varied AV block, Borderline left axis deviation Borderline prolonged QT interval   Neuro/Psych Hx alcohol use disorder, none in 2 years    GI/Hepatic ,GERD  ,,(+) Cirrhosis   ascites      Endo/Other  diabetes, Type 2    Renal/GU Renal disease (Stage II CKD)   BPH    Musculoskeletal   Abdominal   Peds  Hematology  (+) Blood dyscrasia, anemia   Anesthesia Other Findings   Reproductive/Obstetrics                             Anesthesia Physical Anesthesia Plan  ASA: 3  Anesthesia Plan: General   Post-op Pain Management:    Induction: Intravenous  PONV Risk Score and Plan: 2 and Propofol infusion, TIVA and Treatment may vary due to age or medical condition  Airway Management Planned: Natural Airway  Additional Equipment:   Intra-op Plan:   Post-operative Plan:   Informed Consent: I have reviewed the patients History and Physical, chart, labs and discussed the procedure including the risks, benefits and alternatives for the proposed anesthesia with the patient or authorized representative who has indicated his/her understanding and acceptance.       Plan Discussed with: CRNA  Anesthesia Plan Comments: (Video interpreter used for interview. LMA/GETA backup discussed.  Patient consented for risks of anesthesia including but not limited to:   - adverse reactions to medications - damage to eyes, teeth, lips or other oral mucosa - nerve damage due to positioning  - sore throat or hoarseness - damage to heart, brain, nerves, lungs, other parts of body or loss of life  Informed patient about role of CRNA in peri- and intra-operative care.  Patient voiced understanding.)        Anesthesia Quick Evaluation

## 2023-09-10 NOTE — Progress Notes (Signed)
Pt assessment and medication done with the help of online interpreter [place], [phone].

## 2023-09-10 NOTE — Anesthesia Postprocedure Evaluation (Signed)
Anesthesia Post Note  Patient: Shawn Torres  Procedure(s) Performed: ESOPHAGOGASTRODUODENOSCOPY (EGD) WITH PROPOFOL  Patient location during evaluation: PACU Anesthesia Type: General Level of consciousness: awake and alert, oriented and patient cooperative Pain management: pain level controlled Vital Signs Assessment: post-procedure vital signs reviewed and stable Respiratory status: spontaneous breathing, nonlabored ventilation and respiratory function stable Cardiovascular status: blood pressure returned to baseline and stable Postop Assessment: adequate PO intake Anesthetic complications: yes Comments: Concern for aspiration given vomiting during procedure.  O2 sats remained 100% both intra- and post-operatively.  Steroids given. Postop CXR appeared clear on my interpretation.  Hospitalist and floor RN notified.   Encounter Notable Events  Notable Event Outcome Phase Comment  Vomiting Resolved in Lab Intraprocedure Vomited gastric contents at start of endoscopy     Last Vitals:  Vitals:   09/10/23 1406 09/10/23 1421  BP: 120/79 130/82  Pulse: 82 82  Resp: (!) 29 18  Temp:    SpO2: 100% 100%    Last Pain:  Vitals:   09/10/23 1421  TempSrc:   PainSc: Asleep                 Reed Breech

## 2023-09-11 ENCOUNTER — Inpatient Hospital Stay: Payer: Medicaid Other

## 2023-09-11 ENCOUNTER — Encounter: Payer: Self-pay | Admitting: Internal Medicine

## 2023-09-11 DIAGNOSIS — R531 Weakness: Principal | ICD-10-CM

## 2023-09-11 LAB — CBC
HCT: 26.6 % — ABNORMAL LOW (ref 39.0–52.0)
Hemoglobin: 8.8 g/dL — ABNORMAL LOW (ref 13.0–17.0)
MCH: 32.7 pg (ref 26.0–34.0)
MCHC: 33.1 g/dL (ref 30.0–36.0)
MCV: 98.9 fL (ref 80.0–100.0)
Platelets: 104 10*3/uL — ABNORMAL LOW (ref 150–400)
RBC: 2.69 MIL/uL — ABNORMAL LOW (ref 4.22–5.81)
RDW: 14.4 % (ref 11.5–15.5)
WBC: 6.8 10*3/uL (ref 4.0–10.5)
nRBC: 0 % (ref 0.0–0.2)

## 2023-09-11 LAB — PROTIME-INR
INR: 2 — ABNORMAL HIGH (ref 0.8–1.2)
Prothrombin Time: 23.1 s — ABNORMAL HIGH (ref 11.4–15.2)

## 2023-09-11 LAB — COMPREHENSIVE METABOLIC PANEL
ALT: 14 U/L (ref 0–44)
AST: 23 U/L (ref 15–41)
Albumin: 2.7 g/dL — ABNORMAL LOW (ref 3.5–5.0)
Alkaline Phosphatase: 81 U/L (ref 38–126)
Anion gap: 8 (ref 5–15)
BUN: 42 mg/dL — ABNORMAL HIGH (ref 6–20)
CO2: 18 mmol/L — ABNORMAL LOW (ref 22–32)
Calcium: 7.9 mg/dL — ABNORMAL LOW (ref 8.9–10.3)
Chloride: 109 mmol/L (ref 98–111)
Creatinine, Ser: 1.37 mg/dL — ABNORMAL HIGH (ref 0.61–1.24)
GFR, Estimated: >60 mL/min (ref >60)
Glucose, Bld: 275 mg/dL — ABNORMAL HIGH (ref 70–99)
Potassium: 4 mmol/L (ref 3.5–5.1)
Sodium: 135 mmol/L (ref 135–145)
Total Bilirubin: 2.8 mg/dL — ABNORMAL HIGH (ref 0.3–1.2)
Total Protein: 6.4 g/dL — ABNORMAL LOW (ref 6.5–8.1)

## 2023-09-11 LAB — URINE CULTURE: Culture: NO GROWTH

## 2023-09-11 LAB — BODY FLUID CULTURE W GRAM STAIN
Culture: NO GROWTH
Gram Stain: NONE SEEN

## 2023-09-11 MED ORDER — ADULT MULTIVITAMIN W/MINERALS CH: 1.0000 | ORAL_TABLET | Freq: Every day | ORAL | 0 refills | Status: DC

## 2023-09-11 MED ORDER — CARVEDILOL 6.25 MG PO TABS: 6.2500 mg | ORAL_TABLET | Freq: Two times a day (BID) | ORAL | 1 refills | Status: DC

## 2023-09-11 MED ORDER — SPIRONOLACTONE 100 MG PO TABS: 100.0000 mg | ORAL_TABLET | Freq: Every day | ORAL | 2 refills | Status: DC

## 2023-09-11 MED ORDER — RIFAXIMIN 550 MG PO TABS: 550.0000 mg | ORAL_TABLET | Freq: Two times a day (BID) | ORAL | 1 refills | Status: DC

## 2023-09-11 MED ORDER — FUROSEMIDE 40 MG PO TABS: 40.0000 mg | ORAL_TABLET | Freq: Every day | ORAL | 1 refills | Status: DC

## 2023-09-11 MED ORDER — ONDANSETRON HCL 4 MG PO TABS: 4.0000 mg | ORAL_TABLET | Freq: Four times a day (QID) | ORAL | 0 refills | Status: DC | PRN

## 2023-09-11 MED ORDER — LIDOCAINE HCL (PF) 1 % IJ SOLN
10.0000 mL | Freq: Once | INTRAMUSCULAR | Status: AC
  Administered 2023-09-11: 10 mL via INTRADERMAL
  Filled 2023-09-11: qty 10

## 2023-09-11 MED ORDER — PANTOPRAZOLE SODIUM 40 MG PO TBEC: 40.0000 mg | DELAYED_RELEASE_TABLET | Freq: Every day | ORAL | Status: DC

## 2023-09-11 MED ORDER — TAMSULOSIN HCL 0.4 MG PO CAPS: 0.4000 mg | ORAL_CAPSULE | Freq: Every day | ORAL | 1 refills | Status: DC

## 2023-09-11 MED ORDER — LACTULOSE 10 GM/15ML PO SOLN: 30.0000 g | Freq: Three times a day (TID) | ORAL | 0 refills | Status: DC

## 2023-09-11 MED ORDER — PANTOPRAZOLE SODIUM 40 MG PO TBEC: 40.0000 mg | DELAYED_RELEASE_TABLET | Freq: Two times a day (BID) | ORAL | 1 refills | Status: DC

## 2023-09-11 NOTE — Progress Notes (Signed)
GI Inpatient Follow-up Note  Subjective:  Patient seen in follow-up for UGIB. He is s/p EGD yesterday. No acute events overnight. Hemoglobin 8.8 this morning. There have been no signs of overt bleeding. He complained of worsening abdominal distention yesterday after procedure and therapeutic LVP was ordered with albumin infusion. No new complaints offered this morning.   Scheduled Inpatient Medications:   carvedilol  6.25 mg Oral BID   feeding supplement  237 mL Oral TID BM   furosemide  40 mg Oral Daily   lactulose  30 g Oral TID   pantoprazole (PROTONIX) IV  40 mg Intravenous Q12H   rifaximin  550 mg Oral BID   sodium chloride flush  10 mL Intravenous Q12H   spironolactone  100 mg Oral Daily   tamsulosin  0.4 mg Oral Daily   thiamine  100 mg Oral Daily    Continuous Inpatient Infusions:    cefTRIAXone (ROCEPHIN)  IV 2 g (09/11/23 0012)   promethazine (PHENERGAN) injection (IM or IVPB)      PRN Inpatient Medications:  acetaminophen **OR** acetaminophen, benzonatate, labetalol, ondansetron **OR** ondansetron (ZOFRAN) IV, promethazine (PHENERGAN) injection (IM or IVPB)  Review of Systems: Constitutional: Weight is stable.  Eyes: No changes in vision. ENT: No oral lesions, sore throat.  GI: see HPI.  Heme/Lymph: No easy bruising.  CV: No chest pain.  GU: No hematuria.  Integumentary: No rashes.  Neuro: No headaches.  Psych: No depression/anxiety.  Endocrine: No heat/cold intolerance.  Allergic/Immunologic: No urticaria.  Resp: No cough, SOB.  Musculoskeletal: No joint swelling.    Physical Examination: BP 110/79 (BP Location: Right Arm)   Pulse 65   Temp 97.6 F (36.4 C)   Resp 15   Ht 5\' 2"  (1.575 m)   Wt 74.5 kg   SpO2 100%   BMI 30.04 kg/m  Gen: NAD, alert and oriented x 4 HEENT: PEERLA, EOMI, Neck: supple, no JVD or thyromegaly Chest: CTA bilaterally, no wheezes, crackles, or other adventitious sounds CV: RRR, no m/g/c/r Abd: soft, NT, ND, +BS in all  four quadrants; no HSM, guarding, ridigity, or rebound tenderness Ext: no edema, well perfused with 2+ pulses, Skin: no rash or lesions noted Lymph: no LAD  Data: Lab Results  Component Value Date   WBC 6.8 09/11/2023   HGB 8.8 (L) 09/11/2023   HCT 26.6 (L) 09/11/2023   MCV 98.9 09/11/2023   PLT 104 (L) 09/11/2023   Recent Labs  Lab 09/09/23 2056 09/10/23 0537 09/11/23 0647  HGB 8.9* 9.0* 8.8*   Lab Results  Component Value Date   NA 135 09/11/2023   K 4.0 09/11/2023   CL 109 09/11/2023   CO2 18 (L) 09/11/2023   BUN 42 (H) 09/11/2023   CREATININE 1.37 (H) 09/11/2023   Lab Results  Component Value Date   ALT 14 09/11/2023   AST 23 09/11/2023   ALKPHOS 81 09/11/2023   BILITOT 2.8 (H) 09/11/2023   Recent Labs  Lab 09/07/23 1611 09/08/23 0539 09/11/23 0647  APTT 39*  --   --   INR 2.0*   < > 2.0*   < > = values in this interval not displayed.   EGD 09/10/2023: - LA Grade C chronic esophagitis with no bleeding - Grade I esophageal varices WITH NO BLEEDING - Retained gastric fluid. Fluid aspiration performed.  - Mild portal hypertensive gastropathy - Normal examined duodenum   Assessment/Plan:  [name] with a PMH of MetALD cirrhosis c/b esophageal varices, diuretic-resistant ascites  requiring therapeutic LVP q2 weeks, and hepatic encephalopathy, T2DM, and hx of alcohol abuse in remission presented to the [facility] ED via EMS 10/17 for chief complaint of altered mental status suspected 2/2 hepatic encephalopathy. GI consulted for concerns of UGIB. He is s/p EGD 10/20 yesterday.    UGIB - EGD 10/20 yesterday with no active bleeding. Grade I EV seen without any signs or stigmata of bleeding.    Decompensated cirrhosis c/b esophageal varices, diuretic-resistant refractory ascites requiring LVP q2 weeks, and hepatic encephalopathy  MELD 3.0 33 points    Hepatic encephalopathy    Coagulopathic   Hx of esophageal variceal bleeding 09/2021, 11/2021    Portal Hypertension with sequelae  GERD with esophagitis    8.  Hx of medication noncompliance    Recommendations:  - Maintain 2 large bore IVs for access - Continue to monitor serial H&H. Transfuse for Hgb <7.0.  - Continue Ceftriaxone 2 gram every 24 hours IV for concerns of GIB - Discontinue Octreotide. No variceal bleed.  - Protonix 40 mg PO BID should be continued upon discharge - Avoid NSAIDs. Hold DVT prophylaxis.  - Avoid frequent lab draws to prevent lab induced anemia - Monitor CBC, CMP, and INR daily - 2-gram sodium diet should be enforced  - Resume home medications  - GI will sign off at this time.  - Patient should follow-up with primary GI team and follow-through with TIPS evaluation  Please call with questions or concerns.   [name], PA-C [facility] [phone]

## 2023-09-11 NOTE — Procedures (Addendum)
PROCEDURE SUMMARY:  Successful ultrasound guided paracentesis from the right lower quadrant.  Yielded 6.5 L of clear yellow fluid.  No immediate complications.  The patient tolerated the procedure well.   Specimen not sent for labs.  EBL < 2 mL  The patient has previously been evaluated by the [facility], and deemed not a candidate for intervention.   ** Please see Dr. Jerrye Noble note from 02/15/22.    Alwyn Ren, [place] [phone] 09/11/2023, 3:41 PM

## 2023-09-11 NOTE — Discharge Summary (Signed)
Physician Discharge Summary   Patient: Shawn Torres MRN: [id number] DOB: 1966-09-16  Admit date:     09/07/2023  Discharge date: 09/11/23  Discharge Physician: Arnetha Courser   PCP: Kurtis Bushman, MD   Recommendations at discharge:  Please obtain CBC, INR and CMP on follow-up Follow-up with primary care provider Follow-up with gastroenterology at [facility] Please encourage compliance with medication.  Discharge Diagnoses: Principal Problem:   Hepatic encephalopathy (HCC) Active Problems:   Hematemesis   Alcoholic cirrhosis of liver with ascites (HCC)   Lactic acidosis   Malnutrition of moderate degree   BPH (benign prostatic hyperplasia)   Weakness   Hospital Course: Taken from H&P.   Shawn Torres is a 57 y.o. male with medical history significant of alcoholic cirrhosis, recurrent hepatic encephalopathy, IIDM, brought in by family member for evaluation of confusion.  Over the past 7 to 8 days patient with gradually worsening ascites, poor appetite and p.o. intake, mild nausea while taking lactulose so he stopped taking lactulose. Patient follow-up with [facility] GI for his cirrhosis and had his outpatient paracentesis done with removal of 10 L of fluid.  Family found him more sleepy and less responsive this morning so they brought him to ED.  On presentation patient was afebrile, had mild tachycardia, blood pressure mildly elevated.  Chest x-ray with no acute infiltrate.  Labs pertinent for ammonia of 174, AST, ALT normal, albumin 2.8, T. bili 4.6 and INR 2.0.  Lactic acid 2.2.  Patient was started on broad-spectrum antibiotic with cefepime and Flagyl.  Diagnostic paracentesis was done.  10/18: Vital stable, preliminary blood and peritoneal fluid cultures are negative, CMP with potassium of 3.4, bicarb 17, slight increase in creatinine to 1.24 with baseline around 1.1, T. bili 4.9 and INR of 2.3.  Patient had 2 episodes of hematemesis, GI was consulted and 6 units of  FFP was ordered at their request as he will be going for EGD tomorrow.  Started on PPI and octreotide.  10/19: No more hematemesis, hemoglobin at 8.4.  Going for EGD later today.  Some improvement in mentation but still not at baseline.  Gradually worsening abdominal distention.  10/20: Patient underwent EGD which shows some esophagitis, and nonbleeding varices.  Sign of portal hypertension.  Worsening abdominal distention, ordered paracentesis with albumin and likely can go back home tomorrow as mentation is not at baseline.  Patient is being considered for TIPS procedure at [facility] where he will follow-up from here.  10/21:Patient remained stable, now at baseline and wants to go home.  GI signed off stating that he need to follow-up with his gastroenterologist at [facility].  Due to worsening abdominal distention we did another paracentesis before discharge followed by albumin injection with removal of 6.5 L of fluid.  Apparently was not taking his home medications so all new prescriptions were provided.  Patient was instructed to start taking all of his home medications and follow-up closely with his primary care provider and gastroenterologist at [facility] for further management.  Assessment and Plan: * Hepatic encephalopathy [facility]) Patient with history of alcoholic cirrhosis being followed up at [facility]. Increased somnolence and altered mental status, concern for hepatic encephalopathy with elevated ammonia.  Much improved, now at baseline -Continue with lactulose -Continue with rifaximin -GI was consulted  Hematemesis Patient had 2 episode of hematemesis yesterday, no hematemesis today.  History of variceal bleed requiring multiple procedures and banding in the past.  Hemoglobin at 8.4.  GI was consulted s/p EGD today  with concern of esophagitis, portal hypertensive gastropathy and grade 1 varices with no active bleeding. -6 units of FFP was ordered for today as recommended by GI -Monitor  hemoglobin -Octreotide can be discontinued as there was no active variceal bleed -Continue PPI  Alcoholic cirrhosis of liver with ascites (HCC) Decompensated alcoholic liver cirrhosis, worsening T. bili and INR. S/p outpatient paracentesis with removal of 10 L and a diagnostic paracentesis in ED with cultures currently negative.  Patient follow-up with [facility] GI and is being considered for TIPS procedure. -GI is on board -Continue with supportive care -Continue with prophylactic SBP treatment -Another paracentesis ordered due to worsening abdominal distention  Lactic acidosis Seems chronic likely with liver cirrhosis. -Continue to monitor  Malnutrition of moderate degree Dietary supplement.  BPH (benign prostatic hyperplasia) -Continue with flomax   Consultants: Gastroenterology Procedures performed: EGD Disposition: Home Diet recommendation:  Discharge Diet Orders (From admission, onward)     Start     Ordered   09/11/23 0000  Diet - low sodium heart healthy        09/11/23 1557           Cardiac diet DISCHARGE MEDICATION: Allergies as of 09/11/2023       Reactions   Albumin Gc Rash   Patient gets rash with albumin. See media tab picture dated 12/24/21. Needs pre-meds w/ albumin (benadryl) to avoid allergic reaction.         Medication List     TAKE these medications    carvedilol 6.25 MG tablet Commonly known as: COREG Tome 1 tableta (6,25 mg en total) por va oral 2 (dos) veces al [employer]. (Take 1 tablet (6.25 mg total) by mouth 2 (two) times daily.)   feeding supplement Liqd Take 237 mLs by mouth 3 (three) times daily between meals.   furosemide 40 MG tablet Commonly known as: LASIX Tome 1 tableta (40 mg en total) por va oral diariamente. (Take 1 tablet (40 mg total) by mouth daily.)   lactulose 10 GM/15ML solution Commonly known as: CHRONULAC Take 45 mLs (30 g total) by mouth 3 (three) times daily. What changed:  how much to take when to take  this   multivitamin with minerals Tabs tablet Tome 1 tableta por va oral diariamente. (Take 1 tablet by mouth daily.)   ondansetron 4 MG tablet Commonly known as: ZOFRAN Take 1 tablet (4 mg total) by mouth every 6 (six) hours as needed for nausea.   pantoprazole 40 MG tablet Commonly known as: PROTONIX Take 1 tablet (40 mg total) by mouth 2 (two) times daily before a meal. What changed: when to take this   rifaximin 550 MG Tabs tablet Commonly known as: XIFAXAN Tome 1 tableta (550 mg en total) por va oral 2 (dos) veces al [employer]. (Take 1 tablet (550 mg total) by mouth 2 (two) times daily.)   spironolactone 100 MG tablet Commonly known as: ALDACTONE Tome 1 tableta (100 mg en total) por va oral diariamente. (Take 1 tablet (100 mg total) by mouth daily.)   tamsulosin 0.4 MG Caps capsule Commonly known as: FLOMAX Tome 1 cpsula (0,4 mg en total) por va oral al da. (Take 1 capsule (0.4 mg total) by mouth daily.)   thiamine 100 MG tablet Commonly known as: Vitamin B-1 Tome 1 tableta (100 mg en total) por va oral diariamente. (Take 1 tablet (100 mg total) by mouth daily.)        Follow-up Information     Gladman, Cheral Marker, MD. Schedule  an appointment as soon as possible for a visit in 1 week(s).   Specialty: Internal Medicine Contact information: [address] [place] [address] [place] [address] [phone]                Discharge Exam: Ceasar Mons Weights   09/07/23 1828 09/07/23 1931  Weight: 80.7 kg 74.5 kg   General.  Malnourished gentleman, in no acute distress. Pulmonary.  Lungs clear bilaterally, normal respiratory effort. CV.  Regular rate and rhythm, no JVD, rub or murmur. Abdomen.  Soft, nontender, distended, BS positive. CNS.  Alert and oriented .  No focal neurologic deficit. Extremities.  No edema, no cyanosis, pulses intact and symmetrical. Psychiatry.  Judgment and insight appears normal.   Condition at discharge: stable  The results of  significant diagnostics from this hospitalization (including imaging, microbiology, ancillary and laboratory) are listed below for reference.   Imaging Studies: DG Chest Port 1 View  Result Date: 09/10/2023 CLINICAL DATA:  [id number] Aspiration of gastric contents [id number] EXAM: PORTABLE CHEST 1 VIEW COMPARISON:  09/07/2023 FINDINGS: The heart size and mediastinal contours are within normal limits. Aortic atherosclerosis. Low lung volumes. No focal airspace consolidation, pleural effusion, or pneumothorax. The visualized skeletal structures are unremarkable. IMPRESSION: Low lung volumes. No acute cardiopulmonary findings. Electronically Signed   By: Duanne Guess D.O.   On: 09/10/2023 16:08   CT Head Wo Contrast  Result Date: 09/07/2023 CLINICAL DATA:  Mental status change, unknown cause EXAM: CT HEAD WITHOUT CONTRAST TECHNIQUE: Contiguous axial images were obtained from the base of the skull through the vertex without intravenous contrast. RADIATION DOSE REDUCTION: This exam was performed according to the departmental dose-optimization program which includes automated exposure control, adjustment of the mA and/or kV according to patient size and/or use of iterative reconstruction technique. COMPARISON:  CT Head 07/24/22 FINDINGS: Brain: No evidence of acute infarction, hemorrhage, hydrocephalus, extra-axial collection or mass lesion/mass effect. There is sequela of mild chronic microvascular ischemic change. Vascular: No hyperdense vessel or unexpected calcification. Skull: Normal. Negative for fracture or focal lesion. Sinuses/Orbits: No middle ear or mastoid effusion. Paranasal sinuses are notable for mucosal thickening in the bilateral maxillary sinuses. There is a chronic fracture of the lamina papyracea on the right. Dysconjugate gaze. Orbits are otherwise unremarkable. Other: None. IMPRESSION: No acute intracranial abnormality. Electronically Signed   By: Lorenza Cambridge M.D.   On: 09/07/2023 17:47   DG  Ankle 2 Views Right  Result Date: 09/07/2023 CLINICAL DATA:  Bruising. EXAM: RIGHT ANKLE - 2 VIEW COMPARISON:  None Available. FINDINGS: Chronic ankylosis of the distal tibiofibular syndesmosis. No acute fracture or dislocation. Soft tissues are radiographically unremarkable. IMPRESSION: No acute fracture or dislocation. Electronically Signed   By: Minerva Fester M.D.   On: 09/07/2023 17:33   DG Chest Port 1 View  Result Date: 09/07/2023 CLINICAL DATA:  Weakness. Questionable sepsis, evaluate for abnormality EXAM: PORTABLE CHEST 1 VIEW COMPARISON:  07/24/2022 FINDINGS: Low lung volumes. Stable cardiomediastinal silhouette. Elevated right hemidiaphragm. No focal consolidation, pleural effusion, or pneumothorax. No displaced rib fractures. IMPRESSION: No active disease. Electronically Signed   By: Minerva Fester M.D.   On: 09/07/2023 17:31    Microbiology: Results for orders placed or performed during the hospital encounter of 09/07/23  Blood Culture (routine x 2)     Status: None (Preliminary result)   Collection Time: 09/07/23  4:11 PM   Specimen: BLOOD  Result Value Ref Range Status   Specimen Description BLOOD BLOOD RIGHT ARM  Final  Special Requests   Final    BOTTLES DRAWN AEROBIC AND ANAEROBIC Blood Culture results may not be optimal due to an excessive volume of blood received in culture bottles   Culture   Final    NO GROWTH 4 DAYS Performed at [facility], [address] [place], [place] [address]    Report Status PENDING  Incomplete  Peritoneal fluid culture w Gram Stain     Status: None   Collection Time: 09/07/23  4:15 PM   Specimen: Peritoneal Washings; Peritoneal Fluid  Result Value Ref Range Status   Specimen Description   Final    PERITONEAL Performed at [facility], [address] [place], [place] [address]    Special Requests   Final    PERITONEAL Performed at [facility], [address] [place], [place] [address]    Gram  Stain NO WBC SEEN NO ORGANISMS SEEN   Final   Culture   Final    NO GROWTH 3 DAYS Performed at [facility], [address], [place], [place] [address]    Report Status 09/11/2023 FINAL  Final  Blood Culture (routine x 2)     Status: None (Preliminary result)   Collection Time: 09/07/23  4:16 PM   Specimen: BLOOD  Result Value Ref Range Status   Specimen Description BLOOD BLOOD RIGHT ARM  Final   Special Requests   Final    BOTTLES DRAWN AEROBIC AND ANAEROBIC Blood Culture adequate volume   Culture   Final    NO GROWTH 4 DAYS Performed at [facility], [address], [place], [place] [address]    Report Status PENDING  Incomplete  Urine Culture     Status: None   Collection Time: 09/10/23  6:00 AM   Specimen: Urine, Random  Result Value Ref Range Status   Specimen Description   Final    URINE, RANDOM Performed at [facility], [address], [place], [place] [address]    Special Requests   Final    NONE Reflexed from [phone] Performed at [facility], [address], [place], [place] [address]    Culture   Final    NO GROWTH Performed at [facility], [address], [place], [place] [address]    Report Status 09/11/2023 FINAL  Final    Labs: CBC: Recent Labs  Lab 09/07/23 1611 09/08/23 0539 09/08/23 1107 09/08/23 1753 09/09/23 0636 09/09/23 1728 09/09/23 2056 09/10/23 0537 09/11/23 0647  WBC 7.6 8.0 9.1  --   --   --   --  5.7 6.8  NEUTROABS 5.2  --   --   --   --   --   --   --   --   HGB 10.1* 9.8* 9.5*   < > 8.4* 8.8* 8.9* 9.0* 8.8*  HCT 28.3* 27.2* 26.2*   < > 22.9* 25.6* 26.1* 26.4* 26.6*  MCV 93.4 93.5 97.0  --   --   --   --  96.4 98.9  PLT 134* 117* 118*  --   --   --   --  99* 104*   < > = values in this interval not displayed.   Basic Metabolic Panel: Recent Labs  Lab 09/07/23 1611 09/08/23 0539 09/09/23 1728 09/10/23 0537 09/11/23 0647  NA 133* 137 142 138 135  K 3.6 3.4* 2.9* 3.8 4.0  CL 105  110 114* 111 109  CO2 18* 17* 21* 19* 18*  GLUCOSE 125* 138* 136* 111* 275*  BUN 21* 24* 32* 32* 42*  CREATININE 1.10 1.25* 1.31* 1.25* 1.37*  CALCIUM 8.1* 7.9* 8.0* 7.6* 7.9*   Liver Function Tests: Recent Labs  Lab 09/07/23 1611 09/08/23 0539 09/09/23 1728 09/10/23 0537 09/11/23 0647  AST 40 39 25 21 23   ALT 18 17 16 14 14   ALKPHOS 96 83 78 73 81  BILITOT 4.6* 4.9* 3.7* 2.9* 2.8*  PROT 7.3 6.8 6.5 6.2* 6.4*  ALBUMIN 2.8* 2.4* 2.5* 2.3* 2.7*   CBG: Recent Labs  Lab 09/10/23 1308  GLUCAP 107*    Discharge time spent: greater than 30 minutes.  This record has been created using Conservation officer, historic buildings. Errors have been sought and corrected,but may not always be located. Such creation errors do not reflect on the standard of care.   Signed: Arnetha Courser, MD [facility] 09/11/2023

## 2023-09-12 ENCOUNTER — Other Ambulatory Visit: Payer: Self-pay

## 2023-09-12 ENCOUNTER — Observation Stay
Admission: EM | Admit: 2023-09-12 | Discharge: 2023-09-13 | Disposition: A | Discharge status: Home / Self Care | Payer: MEDICAID | Attending: Internal Medicine | Admitting: Internal Medicine

## 2023-09-12 ENCOUNTER — Emergency Department: Payer: MEDICAID

## 2023-09-12 DIAGNOSIS — R5383 Other fatigue: Principal | ICD-10-CM | POA: Insufficient documentation

## 2023-09-12 DIAGNOSIS — R112 Nausea with vomiting, unspecified: Principal | ICD-10-CM | POA: Insufficient documentation

## 2023-09-12 DIAGNOSIS — E871 Hypo-osmolality and hyponatremia: Secondary | ICD-10-CM | POA: Diagnosis present

## 2023-09-12 DIAGNOSIS — E119 Type 2 diabetes mellitus without complications: Secondary | ICD-10-CM | POA: Insufficient documentation

## 2023-09-12 DIAGNOSIS — Z1152 Encounter for screening for COVID-19: Secondary | ICD-10-CM | POA: Insufficient documentation

## 2023-09-12 DIAGNOSIS — K7031 Alcoholic cirrhosis of liver with ascites: Secondary | ICD-10-CM | POA: Diagnosis present

## 2023-09-12 DIAGNOSIS — N400 Enlarged prostate without lower urinary tract symptoms: Secondary | ICD-10-CM | POA: Diagnosis present

## 2023-09-12 DIAGNOSIS — Z79899 Other long term (current) drug therapy: Secondary | ICD-10-CM | POA: Insufficient documentation

## 2023-09-12 DIAGNOSIS — E860 Dehydration: Secondary | ICD-10-CM | POA: Insufficient documentation

## 2023-09-12 LAB — RESP PANEL BY RT-PCR (RSV, FLU A&B, COVID)  RVPGX2
Influenza A by PCR: NEGATIVE
Influenza B by PCR: NEGATIVE
Resp Syncytial Virus by PCR: NEGATIVE
SARS Coronavirus 2 by RT PCR: NEGATIVE

## 2023-09-12 LAB — CBC WITH DIFFERENTIAL/PLATELET
Abs Immature Granulocytes: 0.04 10*3/uL (ref 0.00–0.07)
Basophils Absolute: 0 10*3/uL (ref 0.0–0.1)
Basophils Relative: 0 %
Eosinophils Absolute: 0 10*3/uL (ref 0.0–0.5)
Eosinophils Relative: 0 %
HCT: 25.6 % — ABNORMAL LOW (ref 39.0–52.0)
Hemoglobin: 8.6 g/dL — ABNORMAL LOW (ref 13.0–17.0)
Immature Granulocytes: 1 %
Lymphocytes Relative: 8 %
Lymphs Abs: 0.4 10*3/uL — ABNORMAL LOW (ref 0.7–4.0)
MCH: 33.7 pg (ref 26.0–34.0)
MCHC: 33.6 g/dL (ref 30.0–36.0)
MCV: 100.4 fL — ABNORMAL HIGH (ref 80.0–100.0)
Monocytes Absolute: 0.6 10*3/uL (ref 0.1–1.0)
Monocytes Relative: 12 %
Neutro Abs: 4.1 10*3/uL (ref 1.7–7.7)
Neutrophils Relative %: 79 %
Platelets: 94 10*3/uL — ABNORMAL LOW (ref 150–400)
RBC: 2.55 MIL/uL — ABNORMAL LOW (ref 4.22–5.81)
RDW: 14.4 % (ref 11.5–15.5)
WBC: 5.2 10*3/uL (ref 4.0–10.5)
nRBC: 0 % (ref 0.0–0.2)

## 2023-09-12 LAB — CULTURE, BLOOD (ROUTINE X 2)
Culture: NO GROWTH
Culture: NO GROWTH
Special Requests: ADEQUATE

## 2023-09-12 LAB — LACTIC ACID, PLASMA
Lactic Acid, Venous: 3.5 mmol/L (ref 0.5–1.9)
Lactic Acid, Venous: 3.5 mmol/L (ref 0.5–1.9)

## 2023-09-12 LAB — COMPREHENSIVE METABOLIC PANEL
ALT: 13 U/L (ref 0–44)
AST: 21 U/L (ref 15–41)
Albumin: 2.2 g/dL — ABNORMAL LOW (ref 3.5–5.0)
Alkaline Phosphatase: 65 U/L (ref 38–126)
Anion gap: 10 (ref 5–15)
BUN: 47 mg/dL — ABNORMAL HIGH (ref 6–20)
CO2: 18 mmol/L — ABNORMAL LOW (ref 22–32)
Calcium: 7.8 mg/dL — ABNORMAL LOW (ref 8.9–10.3)
Chloride: 100 mmol/L (ref 98–111)
Creatinine, Ser: 1.55 mg/dL — ABNORMAL HIGH (ref 0.61–1.24)
GFR, Estimated: 53 mL/min — ABNORMAL LOW (ref >60)
Glucose, Bld: 140 mg/dL — ABNORMAL HIGH (ref 70–99)
Potassium: 3.3 mmol/L — ABNORMAL LOW (ref 3.5–5.1)
Sodium: 128 mmol/L — ABNORMAL LOW (ref 135–145)
Total Bilirubin: 2.5 mg/dL — ABNORMAL HIGH (ref 0.3–1.2)
Total Protein: 5.4 g/dL — ABNORMAL LOW (ref 6.5–8.1)

## 2023-09-12 LAB — PROTIME-INR
INR: 2.3 — ABNORMAL HIGH (ref 0.8–1.2)
Prothrombin Time: 25.2 s — ABNORMAL HIGH (ref 11.4–15.2)

## 2023-09-12 LAB — APTT: aPTT: 38 s — ABNORMAL HIGH (ref 24–36)

## 2023-09-12 LAB — AMMONIA: Ammonia: 52 umol/L — ABNORMAL HIGH (ref 9–35)

## 2023-09-12 LAB — OSMOLALITY: Osmolality: 320 mosm/kg — ABNORMAL HIGH (ref 275–295)

## 2023-09-12 MED ORDER — SODIUM CHLORIDE 0.9 % IV SOLN: INTRAVENOUS | Status: DC

## 2023-09-12 MED ORDER — MAGNESIUM HYDROXIDE 400 MG/5ML PO SUSP: 30.0000 mL | Freq: Every day | ORAL | Status: DC | PRN

## 2023-09-12 MED ORDER — PANTOPRAZOLE SODIUM 40 MG PO TBEC
40.0000 mg | DELAYED_RELEASE_TABLET | Freq: Two times a day (BID) | ORAL | Status: DC
  Administered 2023-09-13: 40 mg via ORAL
  Filled 2023-09-12: qty 1

## 2023-09-12 MED ORDER — THIAMINE MONONITRATE 100 MG PO TABS
100.0000 mg | ORAL_TABLET | Freq: Every day | ORAL | Status: DC
  Administered 2023-09-13: 100 mg via ORAL
  Filled 2023-09-12: qty 1

## 2023-09-12 MED ORDER — LACTULOSE 10 GM/15ML PO SOLN
30.0000 g | Freq: Three times a day (TID) | ORAL | Status: DC
  Administered 2023-09-13: 30 g via ORAL
  Filled 2023-09-12: qty 60

## 2023-09-12 MED ORDER — POTASSIUM CHLORIDE IN NACL 20-0.9 MEQ/L-% IV SOLN
INTRAVENOUS | Status: DC
  Filled 2023-09-12 (×2): qty 1000

## 2023-09-12 MED ORDER — ACETAMINOPHEN 325 MG RE SUPP: 650.0000 mg | Freq: Four times a day (QID) | RECTAL | Status: DC | PRN

## 2023-09-12 MED ORDER — CARVEDILOL 6.25 MG PO TABS
6.2500 mg | ORAL_TABLET | Freq: Two times a day (BID) | ORAL | Status: DC
  Administered 2023-09-13: 6.25 mg via ORAL
  Filled 2023-09-12: qty 1

## 2023-09-12 MED ORDER — TRAZODONE HCL 50 MG PO TABS: 25.0000 mg | ORAL_TABLET | Freq: Every evening | ORAL | Status: DC | PRN

## 2023-09-12 MED ORDER — ADULT MULTIVITAMIN W/MINERALS CH
1.0000 | ORAL_TABLET | Freq: Every day | ORAL | Status: DC
  Administered 2023-09-13: 1 via ORAL
  Filled 2023-09-12: qty 1

## 2023-09-12 MED ORDER — LACTATED RINGERS IV BOLUS
1000.0000 mL | Freq: Once | INTRAVENOUS | Status: AC
  Administered 2023-09-12: 1000 mL via INTRAVENOUS

## 2023-09-12 MED ORDER — ACETAMINOPHEN 325 MG PO TABS: 650.0000 mg | ORAL_TABLET | Freq: Four times a day (QID) | ORAL | Status: DC | PRN

## 2023-09-12 MED ORDER — TAMSULOSIN HCL 0.4 MG PO CAPS
0.4000 mg | ORAL_CAPSULE | Freq: Every day | ORAL | Status: DC
  Administered 2023-09-13: 0.4 mg via ORAL
  Filled 2023-09-12: qty 1

## 2023-09-12 MED ORDER — ENSURE ENLIVE PO LIQD: 237.0000 mL | Freq: Three times a day (TID) | ORAL | Status: DC

## 2023-09-12 MED ORDER — IOHEXOL 300 MG/ML  SOLN
100.0000 mL | Freq: Once | INTRAMUSCULAR | Status: AC | PRN
  Administered 2023-09-12: 100 mL via INTRAVENOUS

## 2023-09-12 MED ORDER — RIFAXIMIN 550 MG PO TABS
550.0000 mg | ORAL_TABLET | Freq: Two times a day (BID) | ORAL | Status: DC
  Administered 2023-09-13: 550 mg via ORAL
  Filled 2023-09-12 (×3): qty 1

## 2023-09-12 MED ORDER — ONDANSETRON HCL 4 MG/2ML IJ SOLN
4.0000 mg | Freq: Once | INTRAMUSCULAR | Status: AC
  Administered 2023-09-12: 4 mg via INTRAVENOUS
  Filled 2023-09-12: qty 2

## 2023-09-12 MED ORDER — ENOXAPARIN SODIUM 40 MG/0.4ML IJ SOSY
40.0000 mg | PREFILLED_SYRINGE | INTRAMUSCULAR | Status: DC
  Administered 2023-09-12: 40 mg via SUBCUTANEOUS
  Filled 2023-09-12: qty 0.4

## 2023-09-12 MED ORDER — ONDANSETRON HCL 4 MG PO TABS: 4.0000 mg | ORAL_TABLET | Freq: Four times a day (QID) | ORAL | Status: DC | PRN

## 2023-09-12 NOTE — H&P (Signed)
[place]   PATIENT NAME: Shawn Torres    MR#:  [id number]  DATE OF BIRTH:  December 08, 1965  DATE OF ADMISSION:  09/12/2023  PRIMARY CARE PHYSICIAN: Kurtis Bushman, MD   Patient is coming from: Home  REQUESTING/REFERRING PHYSICIAN: Claudell Kyle, MD  CHIEF COMPLAINT:   Chief Complaint  Patient presents with   Fatigue    HISTORY OF PRESENT ILLNESS:  Shawn Torres is a 57 y.o. Hispanic male with medical history significant for alcoholic liver cirrhosis, recurrent hepatic encephalopathy and ascites and type 2 diabetes mellitus, presented to the emergency room with acute onset of intractable nausea and vomiting that started at 11 AM today.  He was having altered mental status with decreased responsiveness since then.  His mental status has improved in the ER.  He was just discharged yesterday after being managed for hepatic encephalopathy due to nonadherence with lactulose.  He was ruled out SBP.  He had an EGD that revealed esophagitis.  He denied any chest pain or dyspnea or cough or wheezing.  No dysuria, oliguria or hematuria or flank pain.  No bleeding diathesis.  ED Course: When he came to the ER, BP was 93/61 and later 95/56 with otherwise normal vital signs.  Labs revealed hyponatremia 128 and CO2 of 18 with potassium 3.3 and BUN 47 creatinine 1.55 compared to 42 and 1.37 yesterday, albumin 2.2 with total protein of 5.4.  Ammonia level was 52.  Lactic acid was 3.5 and later the same.  CBC showed anemia close to baseline with macrocytosis and thrombocytopenia slightly worse than previous level yesterday. EKG as reviewed by me : EKG showed normal sinus rhythm with a rate of 84 with borderline left axis deviation. Imaging: Portable x-ray showed no acute cardiopulmonary disease.  The patient was given 2 L bolus of IV lactated ringer.  He will be admitted to a medical telemetry observation bed for further evaluation and management. PAST MEDICAL HISTORY:   Past Medical  History:  Diagnosis Date   Ascites    Cirrhosis, alcoholic (HCC)    Diabetes mellitus without complication (HCC)    Memory loss     PAST SURGICAL HISTORY:   Past Surgical History:  Procedure Laterality Date   ESOPHAGOGASTRODUODENOSCOPY N/A 10/10/2021   Procedure: ESOPHAGOGASTRODUODENOSCOPY (EGD);  Surgeon: Midge Minium, MD;  Location: [facility] ENDOSCOPY;  Service: Endoscopy;  Laterality: N/A;   ESOPHAGOGASTRODUODENOSCOPY N/A 12/27/2021   Procedure: ESOPHAGOGASTRODUODENOSCOPY (EGD);  Surgeon: [name], DO;  Location: [facility] ENDOSCOPY;  Service: Gastroenterology;  Laterality: N/A;  Spanish Interpreter   ESOPHAGOGASTRODUODENOSCOPY N/A 01/20/2022   Procedure: ESOPHAGOGASTRODUODENOSCOPY (EGD);  Surgeon: [name], DO;  Location: [facility] ENDOSCOPY;  Service: Gastroenterology;  Laterality: N/A;  Spanish Interpreter   ESOPHAGOGASTRODUODENOSCOPY N/A 02/03/2022   Procedure: ESOPHAGOGASTRODUODENOSCOPY (EGD);  Surgeon: [name], DO;  Location: [facility] ENDOSCOPY;  Service: Gastroenterology;  Laterality: N/A;  Needs Spanish Interpreter - (prefers) all calls to daughter (she speaks [place])   ESOPHAGOGASTRODUODENOSCOPY (EGD) WITH PROPOFOL N/A 12/11/2021   Procedure: ESOPHAGOGASTRODUODENOSCOPY (EGD) WITH PROPOFOL;  Surgeon: [name], DO;  Location: [facility] ENDOSCOPY;  Service: Gastroenterology;  Laterality: N/A;   ESOPHAGOGASTRODUODENOSCOPY (EGD) WITH PROPOFOL N/A 09/10/2023   Procedure: ESOPHAGOGASTRODUODENOSCOPY (EGD) WITH PROPOFOL;  Surgeon: Toledo, Boykin Nearing, MD;  Location: [facility] ENDOSCOPY;  Service: Gastroenterology;  Laterality: N/A;   IR PARACENTESIS  02/15/2022    SOCIAL HISTORY:   Social History   Tobacco Use   Smoking status: Never   Smokeless tobacco: Never  Substance Use Topics  Alcohol use: Not Currently    Comment: SINCE JUNE 2022    FAMILY HISTORY:   Family History  Family history unknown: Yes    DRUG ALLERGIES:   Allergies  Allergen  Reactions   Albumin Gc Rash    Patient gets rash with albumin. See media tab picture dated 12/24/21. Needs pre-meds w/ albumin (benadryl) to avoid allergic reaction.     REVIEW OF SYSTEMS:   ROS As per history of present illness. All pertinent systems were reviewed above. Constitutional, HEENT, cardiovascular, respiratory, GI, GU, musculoskeletal, neuro, psychiatric, endocrine, integumentary and hematologic systems were reviewed and are otherwise negative/unremarkable except for positive findings mentioned above in the HPI.   MEDICATIONS AT HOME:   Prior to Admission medications   Medication Sig Start Date End Date Taking? Authorizing Provider  carvedilol (COREG) 6.25 MG tablet Take 1 tablet (6.25 mg total) by mouth 2 (two) times daily. 09/11/23   Arnetha Courser, MD  feeding supplement (ENSURE ENLIVE / ENSURE PLUS) LIQD Take 237 mLs by mouth 3 (three) times daily between meals. 07/27/22   Arnetha Courser, MD  furosemide (LASIX) 40 MG tablet Take 1 tablet (40 mg total) by mouth daily. 09/11/23   Arnetha Courser, MD  lactulose (CHRONULAC) 10 GM/15ML solution Take 45 mLs (30 g total) by mouth 3 (three) times daily. 09/11/23 09/10/24  Arnetha Courser, MD  Multiple Vitamin (MULTIVITAMIN WITH MINERALS) TABS tablet Take 1 tablet by mouth daily. 09/11/23   Arnetha Courser, MD  ondansetron (ZOFRAN) 4 MG tablet Take 1 tablet (4 mg total) by mouth every 6 (six) hours as needed for nausea. 09/11/23   Arnetha Courser, MD  pantoprazole (PROTONIX) 40 MG tablet Take 1 tablet (40 mg total) by mouth 2 (two) times daily before a meal. 09/11/23   Arnetha Courser, MD  rifaximin (XIFAXAN) 550 MG TABS tablet Take 1 tablet (550 mg total) by mouth 2 (two) times daily. 09/11/23   Arnetha Courser, MD  spironolactone (ALDACTONE) 100 MG tablet Take 1 tablet (100 mg total) by mouth daily. 09/11/23   Arnetha Courser, MD  tamsulosin (FLOMAX) 0.4 MG CAPS capsule Take 1 capsule (0.4 mg total) by mouth daily. 09/11/23   Arnetha Courser, MD   thiamine (VITAMIN B-1) 100 MG tablet Take 1 tablet (100 mg total) by mouth daily. Patient not taking: Reported on 09/08/2023 07/28/22   Arnetha Courser, MD      VITAL SIGNS:  Blood pressure 121/65, pulse 61, temperature 98.8 F (37.1 C), temperature source Oral, resp. rate 19, height 5\' 5"  (1.651 m), weight 74.4 kg, SpO2 98%.  PHYSICAL EXAMINATION:  Physical Exam  GENERAL:  57 y.o.-year-old Hispanic male patient lying in the bed with no acute distress.  EYES: Pupils equal, round, reactive to light and accommodation.  Positive scleral icterus. Extraocular muscles intact.  HEENT: Head atraumatic, normocephalic. Oropharynx with slightly dry mucous membrane and tongue and nasopharynx clear.  NECK:  Supple, no jugular venous distention. No thyroid enlargement, no tenderness.  LUNGS: Normal breath sounds bilaterally, no wheezing, rales,rhonchi or crepitation. No use of accessory muscles of respiration.  CARDIOVASCULAR: Regular rate and rhythm, S1, S2 normal. No murmurs, rubs, or gallops.  ABDOMEN: Soft, distended, nontender. Bowel sounds present. No organomegaly or mass.  EXTREMITIES: No pedal edema, cyanosis, or clubbing.  NEUROLOGIC: Cranial nerves II through XII are intact. Muscle strength 5/5 in all extremities. Sensation intact. Gait not checked.  PSYCHIATRIC: The patient is alert and oriented x 3.  Normal affect and good eye contact. SKIN: No obvious rash,  lesion, or ulcer.   LABORATORY PANEL:   CBC Recent Labs  Lab 09/12/23 1748  WBC 5.2  HGB 8.6*  HCT 25.6*  PLT 94*   ------------------------------------------------------------------------------------------------------------------  Chemistries  Recent Labs  Lab 09/12/23 1918  NA 128*  K 3.3*  CL 100  CO2 18*  GLUCOSE 140*  BUN 47*  CREATININE 1.55*  CALCIUM 7.8*  AST 21  ALT 13  ALKPHOS 65  BILITOT 2.5*    ------------------------------------------------------------------------------------------------------------------  Cardiac Enzymes No results for input(s): "TROPONINI" in the last 168 hours. ------------------------------------------------------------------------------------------------------------------  RADIOLOGY:  CT ABDOMEN PELVIS W CONTRAST  Result Date: 09/12/2023 CLINICAL DATA:  Generalized abdominal pain, hypotension, lactic acidosis, history of cirrhosis EXAM: CT ABDOMEN AND PELVIS WITH CONTRAST TECHNIQUE: Multidetector CT imaging of the abdomen and pelvis was performed using the standard protocol following bolus administration of intravenous contrast. RADIATION DOSE REDUCTION: This exam was performed according to the departmental dose-optimization program which includes automated exposure control, adjustment of the mA and/or kV according to patient size and/or use of iterative reconstruction technique. CONTRAST:  OMNIPAQUE IOHEXOL 300 MG/ML  SOLN COMPARISON:  CT 07/25/2019. FINDINGS: Lower chest: Borderline cardiomegaly with coronary artery calcifications. Trace pleural thickening and basilar atelectasis. Hepatobiliary: Cirrhotic hepatic morphology with nodular contours. No evidence of focal hepatic lesion. Calcified gallstone without pericholecystic inflammatory change. No intra or extrahepatic biliary ductal dilatation. Pancreas: Parenchymal atrophy. No ductal dilatation or inflammation. Spleen: Nonenlarged. Small cleft posteriorly. No focal abnormality. Adrenals/Urinary Tract: No adrenal nodule. No hydronephrosis, renal calculi, or suspicious renal abnormality. Partially distended urinary bladder, normal for degree of distension. Stomach/Bowel: Bowel assessment is limited in the absence of enteric contrast and presence of ascites. There are paraesophageal and perigastric varices. Fluid within the stomach which is not abnormally distended. The small bowel is diffusely fluid-filled,  dilated in the central abdomen up to 4.5 cm. There is no discrete transition point to suggest obstruction. Normal appendix visualized. There is formed stool in the ascending colon. Mild gaseous distension of transverse colon. Liquid stool in the descending colon with question of wall hyperemia. No bowel pneumatosis. Vascular/Lymphatic: Small amount of nonocclusive thrombus in the extrahepatic main portal vein, for example series 2, image 22-26. Small volume nonocclusive thrombus in the superior mesenteric vein, series 2, image 37. Paraesophageal and perigastric varices. No portal venous or mesenteric gas. Normal caliber abdominal aorta. Limited assessment for adenopathy in the setting of ascites. Reproductive: Prostate is unremarkable. Other: Moderate volume abdominopelvic ascites. Generalized mesenteric edema with small volume mesenteric ascites. Ascites tracks into the inguinal canals, more so on the left. No free intra-abdominal air. Musculoskeletal: Scattered Schmorl's nodes in the spine. There are no acute or suspicious osseous abnormalities. IMPRESSION: 1. 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 2. Cirrhosis with sequela of portal hypertension including moderate volume abdominopelvic ascites, paraesophageal and perigastric varices. 3. Diffusely fluid-filled small bowel, dilated in the central abdomen up to 4.5 cm. No discrete transition point to suggest obstruction. Findings may represent ileus or enteritis. 4. Liquid stool in the descending colon with question of wall hyperemia. Consider colitis. 5. Cholelithiasis. 6. Aortic Atherosclerosis (ICD10-I70.0). Coronary artery calcifications. Electronically Signed   By: Narda Rutherford M.D.   On: 09/12/2023 21:05   DG Chest Port 1 View  Result Date: 09/12/2023 CLINICAL DATA:  Sepsis EXAM: PORTABLE CHEST 1 VIEW COMPARISON:  Chest x-ray 09/10/2023 FINDINGS: The heart size and mediastinal contours are  within normal limits. Both lungs are clear. The visualized skeletal structures  are unremarkable. IMPRESSION: No active disease. Electronically Signed   By: Darliss Cheney M.D.   On: 09/12/2023 19:39   US Paracentesis  Result Date: 09/11/2023 INDICATION: Patient with a history of alcoholic cirrhosis with recurrent ascites. Therapeutic paracentesis requested. EXAM: ULTRASOUND GUIDED PARACENTESIS MEDICATIONS: 1% lidocaine 10 mL COMPLICATIONS: None immediate. PROCEDURE: Informed written consent was obtained from the patient after a discussion of the risks, benefits and alternatives to treatment. A timeout was performed prior to the initiation of the procedure. Initial ultrasound scanning demonstrates a large amount of ascites within the right lower abdominal quadrant. The right lower abdomen was prepped and draped in the usual sterile fashion. 1% lidocaine was used for local anesthesia. Following this, a 19 gauge, 7-cm, Yueh catheter was introduced. An ultrasound image was saved for documentation purposes. The paracentesis was performed. The catheter was removed and a dressing was applied. The patient tolerated the procedure well without immediate post procedural complication. Patient received post-procedure intravenous albumin; see nursing notes for details. FINDINGS: A total of approximately 6.5 L of clear yellow fluid was removed. IMPRESSION: Successful ultrasound-guided paracentesis yielding 6.5 liters of peritoneal fluid. Procedure performed by Alwyn Ren NP PLAN: The patient has previously been evaluated by the [facility], and deemed not a candidate for intervention. Please see Dr. Jerrye Noble note from 02/15/22. Electronically Signed   By: Malachy Moan M.D.   On: 09/11/2023 16:22      IMPRESSION AND PLAN:  Assessment and Plan: Hyponatremia - This is likely hypovolemic due to recurrent nausea and vomiting and dehydration. - The patient will be  admitted to a medical telemetry observation bed. - We will continue hydration with IV normal saline. - We will follow sodium level. - We will avoid diuretics.  Alcoholic cirrhosis of liver with ascites (HCC) - We will continue rifaximin, and lactulose but hold off Aldactone for now as well as Lasix.  Type 2 diabetes mellitus, without long-term current use of insulin (HCC) The patient will be placed on supplement coverage with NovoLog.  BPH (benign prostatic hyperplasia) Will continue Flomax.    DVT prophylaxis: Lovenox.  Advanced Care Planning:  Code Status: full code.  Family Communication:  The plan of care was discussed in details with the patient (and family). I answered all questions. The patient agreed to proceed with the above mentioned plan. Further management will depend upon hospital course. Disposition Plan: Back to previous home environment Consults called: none.  All the records are reviewed and case discussed with ED provider.  Status is: Observation  I certify that at the time of admission, it is my clinical judgment that the patient will require hospital care extending less than 2 midnights.                            Dispo: The patient is from: Home              Anticipated d/c is to: Home              Patient currently is not medically stable to d/c.              Difficult to place patient: No  Hannah Beat M.D on 09/13/2023 at 1:43 AM  [facility]   From 7 PM-7 AM, contact night-coverage [email or web address]  CC: Primary care physician; Kurtis Bushman, MD

## 2023-09-12 NOTE — Assessment & Plan Note (Signed)
 -   The patient will be placed on supplement coverage with NovoLog 

## 2023-09-12 NOTE — Assessment & Plan Note (Signed)
-   This is likely hypovolemic due to recurrent nausea and vomiting and dehydration. - The patient will be admitted to a medical telemetry observation bed. - We will continue hydration with IV normal saline. - We will follow sodium level. - We will avoid diuretics.

## 2023-09-12 NOTE — ED Notes (Signed)
Called lab

## 2023-09-12 NOTE — Assessment & Plan Note (Signed)
Will continue Flomax.

## 2023-09-12 NOTE — ED Triage Notes (Signed)
Pt bib [facility] from home cx of weakness. Pt was discharged yesterday due to being septic. Pt family stated he started acting "off" around 11am. Pt cx of N/V/D and abdominal pain. Pt has hx of cirrhosis of liver and had a paracentesis done yesterday.\  BP 109/72 HR 82 98% Room air RR 30'S 100.4 Axillary

## 2023-09-12 NOTE — ED Notes (Signed)
Pt family asked for water for pt. This nurse informed the family and pt that the pt is NPO and can not have fluids for precautionary reasons.

## 2023-09-12 NOTE — ED Provider Notes (Signed)
[facility] Provider Note    Event Date/Time   First MD Initiated Contact with Patient 09/12/23 1607     (approximate)   History   Fatigue   HPI Shawn Torres is a 57 y.o. male with alcohol induced cirrhosis, esophageal varices, hepatic encephalopathy, DM2, pancytopenia presenting today for fatigue.  Patient reportedly just discharged from the hospital yesterday.  Family noticed that he was acting off around 11 AM.  Complaining of abdominal pain, nausea, vomiting, and diarrhea.  Was found to be febrile.  Chart Review: Patient admitted on 09/07/2023 for hepatic encephalopathy.  Had had worsening ascites, poor appetite, and decreased p.o. intake.  Had also stopped taking lactulose.  Notable for ammonia of 174.  Lactic 2.2.  Started on broad-spectrum antibiotics with cefepime and Flagyl.  Diagnostic paracentesis was performed.  This was negative for SBP.  GI was consulted.  Patient did have 2 episodes of hematemesis and was given 6 units FFP.  Started on PPI and octreotide.  EGD showed esophagitis and nonbleeding varices.  Patient stabilized and back to baseline wanting to go home.  GI signed off.  Patient had 6.5 L of fluid removed on day of discharge.     Physical Exam   Triage Vital Signs: ED Triage Vitals  Encounter Vitals Group     BP 09/12/23 1612 93/61     Systolic BP Percentile --      Diastolic BP Percentile --      Pulse Rate 09/12/23 1612 83     Resp 09/12/23 1612 (!) 27     Temp 09/12/23 1612 99.8 F (37.7 C)     Temp Source 09/12/23 1612 Oral     SpO2 09/12/23 1612 97 %     Weight 09/12/23 1613 164 lb (74.4 kg)     Height 09/12/23 1613 5\' 5"  (1.651 m)     Head Circumference --      Peak Flow --      Pain Score 09/12/23 1613 9     Pain Loc --      Pain Education --      Exclude from Growth Chart --     Most recent vital signs: Vitals:   09/12/23 2000 09/12/23 2300  BP: 104/60 110/68  Pulse: (!) 59 (!) 58  Resp: 19 20  Temp:     SpO2: 96% 96%   Physical Exam: I have reviewed the vital signs and nursing notes. General: Awake, alert, no acute distress.  Nontoxic appearing. Head:  Atraumatic, normocephalic.   ENT:  EOM intact, PERRL. Oral mucosa is pink and moist with no lesions. Neck: Neck is supple with full range of motion, No meningeal signs. Cardiovascular:  RRR, No murmurs. Peripheral pulses palpable and equal bilaterally. Respiratory:  Symmetrical chest wall expansion.  No rhonchi, rales, or wheezes.  Good air movement throughout.  No use of accessory muscles.   Musculoskeletal:  No cyanosis or edema. Moving extremities with full ROM Abdomen:  Soft, mild distention with generalized tenderness palpation. Neuro:  GCS 15, moving all four extremities, interacting appropriately. Speech clear. Psych:  Calm, appropriate.   Skin:  Warm, dry, no rash.    ED Results / Procedures / Treatments   Labs (all labs ordered are listed, but only abnormal results are displayed) Labs Reviewed  LACTIC ACID, PLASMA - Abnormal; Notable for the following components:      Result Value   Lactic Acid, Venous 3.5 (*)    All other components within normal limits  LACTIC ACID, PLASMA - Abnormal; Notable for the following components:   Lactic Acid, Venous 3.5 (*)    All other components within normal limits  PROTIME-INR - Abnormal; Notable for the following components:   Prothrombin Time 25.2 (*)    INR 2.3 (*)    All other components within normal limits  APTT - Abnormal; Notable for the following components:   aPTT 38 (*)    All other components within normal limits  AMMONIA - Abnormal; Notable for the following components:   Ammonia 52 (*)    All other components within normal limits  COMPREHENSIVE METABOLIC PANEL - Abnormal; Notable for the following components:   Sodium 128 (*)    Potassium 3.3 (*)    CO2 18 (*)    Glucose, Bld 140 (*)    BUN 47 (*)    Creatinine, Ser 1.55 (*)    Calcium 7.8 (*)    Total Protein  5.4 (*)    Albumin 2.2 (*)    Total Bilirubin 2.5 (*)    GFR, Estimated 53 (*)    All other components within normal limits  CBC WITH DIFFERENTIAL/PLATELET - Abnormal; Notable for the following components:   RBC 2.55 (*)    Hemoglobin 8.6 (*)    HCT 25.6 (*)    MCV 100.4 (*)    Platelets 94 (*)    Lymphs Abs 0.4 (*)    All other components within normal limits  RESP PANEL BY RT-PCR (RSV, FLU A&B, COVID)  RVPGX2  CULTURE, BLOOD (ROUTINE X 2)  CULTURE, BLOOD (ROUTINE X 2)  CBC WITH DIFFERENTIAL/PLATELET  URINALYSIS, W/ REFLEX TO CULTURE (INFECTION SUSPECTED)  BASIC METABOLIC PANEL  CBC  NA AND K (SODIUM & POTASSIUM), RAND UR  TSH  OSMOLALITY  OSMOLALITY, URINE     EKG My EKG interpretation: Rate of 84, sinus rhythm, left axis deviation.  No acute ST elevations or depressions   RADIOLOGY Independently interpreted CT imaging and agree with radiology read   PROCEDURES:  Critical Care performed: No  Procedures   MEDICATIONS ORDERED IN ED: Medications  ondansetron (ZOFRAN) injection 4 mg (has no administration in time range)  rifaximin (XIFAXAN) tablet 550 mg (has no administration in time range)  carvedilol (COREG) tablet 6.25 mg (has no administration in time range)  lactulose (CHRONULAC) 10 GM/15ML solution 30 g (has no administration in time range)  ondansetron (ZOFRAN) tablet 4 mg (has no administration in time range)  pantoprazole (PROTONIX) EC tablet 40 mg (has no administration in time range)  tamsulosin (FLOMAX) capsule 0.4 mg (has no administration in time range)  feeding supplement (ENSURE ENLIVE / ENSURE PLUS) liquid 237 mL (has no administration in time range)  multivitamin with minerals tablet 1 tablet (has no administration in time range)  thiamine (VITAMIN B1) tablet 100 mg (has no administration in time range)  enoxaparin (LOVENOX) injection 40 mg (has no administration in time range)  0.9 %  sodium chloride infusion (has no administration in time  range)  acetaminophen (TYLENOL) tablet 650 mg (has no administration in time range)    Or  acetaminophen (TYLENOL) suppository 650 mg (has no administration in time range)  traZODone (DESYREL) tablet 25 mg (has no administration in time range)  magnesium hydroxide (MILK OF MAGNESIA) suspension 30 mL (has no administration in time range)  0.9 % NaCl with KCl 20 mEq/ L  infusion (has no administration in time range)  lactated ringers bolus 1,000 mL (0 mLs Intravenous Stopped 09/12/23 2010)  iohexol (  OMNIPAQUE) 300 MG/ML solution 100 mL (100 mLs Intravenous Contrast Given 09/12/23 1815)  lactated ringers bolus 1,000 mL (0 mLs Intravenous Stopped 09/12/23 2309)     IMPRESSION / MDM / ASSESSMENT AND PLAN / ED COURSE  I reviewed the triage vital signs and the nursing notes.                              Differential diagnosis includes, but is not limited to, SBO, hepatic encephalopathy, less likely SBP, dehydration secondary to vomiting and diarrhea.  Patient's presentation is most consistent with acute complicated illness / injury requiring diagnostic workup.  Patient is a 57 year old male who was just discharged yesterday for weakness and hepatic encephalopathy presenting today for altered mental status and weakness.  Has had nausea vomiting and diarrhea since discharge over the past 24 hours.  Initially hypotensive here on arrival and febrile outpatient.  No fever here with [place].  No tachycardia or hypoxia.  Patient initially given 1 L fluids.  Lactic elevated at 3.5.  No leukocytosis and hemoglobin stable with yesterday.  Ammonia diminished from recent hospitalization does not appear encephalopathic right now.  Notably his sodium is low at 128.  Suspect likely that this is related to dehydration in the setting of vomiting and diarrhea.  CT imaging of the abdomen/pelvis shows concern for colitis but no other acute intra-abdominal pathology.  Low suspicion for SBP given recent diagnostic paracentesis  several days ago which was negative.  Given ongoing nausea and hypotension with lactic acidosis, patient will be admitted to hospitalist service for further care.  The patient is on the cardiac monitor to evaluate for evidence of arrhythmia and/or significant heart rate changes. Clinical Course as of 09/12/23 2337  Tue Sep 12, 2023  1744 Lactic Acid, Venous(!!): 3.5 [DW]  1832 CBC with Differential/Platelet(!) Globin and platelets overall consistent with yesterday's baseline [DW]  2000 Lactic Acid, Venous(!!): 3.5 [DW]  2010 Comprehensive metabolic panel(!) New hyponatremia otherwise labs overall comparable to yesterday at discharge [DW]    Clinical Course User Index [DW] Janith Lima, MD     FINAL CLINICAL IMPRESSION(S) / ED DIAGNOSES   Final diagnoses:  Nausea vomiting and diarrhea  Dehydration     Rx / DC Orders   ED Discharge Orders     None        Note:  This document was prepared using Dragon voice recognition software and may include unintentional dictation errors.   Janith Lima, MD 09/12/23 [phone]

## 2023-09-12 NOTE — ED Notes (Signed)
Pt had one episode of loose watery stool. Pericare provided and brief applied. Call light within reach and bed in lowest position.

## 2023-09-12 NOTE — Assessment & Plan Note (Signed)
-   We will continue rifaximin, and lactulose but hold off Aldactone for now as well as Lasix.

## 2023-09-12 NOTE — ED Notes (Signed)
X-ray at bedside

## 2023-09-13 DIAGNOSIS — R197 Diarrhea, unspecified: Secondary | ICD-10-CM

## 2023-09-13 DIAGNOSIS — R112 Nausea with vomiting, unspecified: Secondary | ICD-10-CM

## 2023-09-13 DIAGNOSIS — E119 Type 2 diabetes mellitus without complications: Secondary | ICD-10-CM

## 2023-09-13 DIAGNOSIS — E860 Dehydration: Secondary | ICD-10-CM

## 2023-09-13 LAB — URINALYSIS, W/ REFLEX TO CULTURE (INFECTION SUSPECTED)
Bacteria, UA: NONE SEEN
Bilirubin Urine: NEGATIVE
Glucose, UA: NEGATIVE mg/dL
Hgb urine dipstick: NEGATIVE
Ketones, ur: NEGATIVE mg/dL
Nitrite: NEGATIVE
Protein, ur: NEGATIVE mg/dL
Specific Gravity, Urine: 1.035 — ABNORMAL HIGH (ref 1.005–1.030)
pH: 5 (ref 5.0–8.0)

## 2023-09-13 LAB — OSMOLALITY, URINE: Osmolality, Ur: 459 mosm/kg (ref 300–900)

## 2023-09-13 LAB — CBC
HCT: 23.3 % — ABNORMAL LOW (ref 39.0–52.0)
Hemoglobin: 8 g/dL — ABNORMAL LOW (ref 13.0–17.0)
MCH: 34.9 pg — ABNORMAL HIGH (ref 26.0–34.0)
MCHC: 34.3 g/dL (ref 30.0–36.0)
MCV: 101.7 fL — ABNORMAL HIGH (ref 80.0–100.0)
Platelets: 93 10*3/uL — ABNORMAL LOW (ref 150–400)
RBC: 2.29 MIL/uL — ABNORMAL LOW (ref 4.22–5.81)
RDW: 14.7 % (ref 11.5–15.5)
WBC: 6.8 10*3/uL (ref 4.0–10.5)
nRBC: 0 % (ref 0.0–0.2)

## 2023-09-13 LAB — BASIC METABOLIC PANEL
Anion gap: 6 (ref 5–15)
BUN: 42 mg/dL — ABNORMAL HIGH (ref 6–20)
CO2: 20 mmol/L — ABNORMAL LOW (ref 22–32)
Calcium: 7.5 mg/dL — ABNORMAL LOW (ref 8.9–10.3)
Chloride: 108 mmol/L (ref 98–111)
Creatinine, Ser: 1.24 mg/dL (ref 0.61–1.24)
GFR, Estimated: >60 mL/min (ref >60)
Glucose, Bld: 99 mg/dL (ref 70–99)
Potassium: 3.6 mmol/L (ref 3.5–5.1)
Sodium: 134 mmol/L — ABNORMAL LOW (ref 135–145)

## 2023-09-13 LAB — NA AND K (SODIUM & POTASSIUM), RAND UR
Potassium Urine: 22 mmol/L
Sodium, Ur: 18 mmol/L

## 2023-09-13 LAB — TSH: TSH: 0.654 u[IU]/mL (ref 0.350–4.500)

## 2023-09-13 NOTE — ED Notes (Signed)
While adjusting pt in the bed, pt was found to be soaked with urine and had a brief soiled with feces. Pt was then cleaned, new linens, gown, chuck pads, and brief were applied. Pt was repositioned in the bed and warm blankets were given. Pt is clean and dry at this time.

## 2023-09-13 NOTE — ED Notes (Signed)
Pt sleeping at this time.  NAD.

## 2023-09-13 NOTE — Discharge Summary (Signed)
Physician Discharge Summary   Patient: Shawn Torres MRN: [id number] DOB: [birth year]  Admit date:     09/12/2023  Discharge date: 09/13/23  Discharge Physician: Loyce Dys   PCP: Kurtis Bushman, MD   Recommendations at discharge:  Follow-up with primary care Have been counseled on avoidance of alcohol use  Discharge Diagnoses:   Hospital Course: From HPI "Shawn Torres is a 57 y.o. Hispanic male with medical history significant for alcoholic liver cirrhosis, recurrent hepatic encephalopathy and ascites and type 2 diabetes mellitus, presented to the emergency room with acute onset of intractable nausea and vomiting that started at 11 AM today.  He was having altered mental status with decreased responsiveness since then.  His mental status has improved in the ER.  He was just discharged yesterday after being managed for hepatic encephalopathy due to nonadherence with lactulose.  He was ruled out SBP.  He had an EGD that revealed esophagitis.  He denied any chest pain or dyspnea or cough or wheezing.  No dysuria, oliguria or hematuria or flank pain.  No bleeding diathesis.   ED Course: When he came to the ER, BP was 93/61 and later 95/56 with otherwise normal vital signs.  Labs revealed hyponatremia 128 and CO2 of 18 with potassium 3.3 and BUN 47 creatinine 1.55 compared to 42 and 1.37 yesterday, albumin 2.2 with total protein of 5.4.  Ammonia level was 52.  Lactic acid was 3.5 and later the same.  CBC showed anemia close to baseline with macrocytosis and thrombocytopenia slightly worse than previous level yesterday. EKG as reviewed by me : EKG showed normal sinus rhythm with a rate of 84 with borderline left axis deviation. Imaging: Portable x-ray showed no acute cardiopulmonary disease.  "   The patient was given 2 L bolus of IV lactated ringer.  Patient was subsequently admitted on observation for management of hyponatremia.  Sodium level improved with IV fluid resuscitation and  currently patient is back to his baseline and therefore being discharged to follow-up with primary care physician    Consultants: None Procedures performed: None Disposition: Home Diet recommendation:  Carb modified diet DISCHARGE MEDICATION: Allergies as of 09/13/2023       Reactions   Albumin Gc Rash   Patient gets rash with albumin. See media tab picture dated 12/24/21. Needs pre-meds w/ albumin (benadryl) to avoid allergic reaction.         Medication List     TAKE these medications    carvedilol 6.25 MG tablet Commonly known as: COREG Tome 1 tableta (6,25 mg en total) por va oral 2 (dos) veces al [employer]. (Take 1 tablet (6.25 mg total) by mouth 2 (two) times daily.)   feeding supplement Liqd Take 237 mLs by mouth 3 (three) times daily between meals.   furosemide 40 MG tablet Commonly known as: LASIX Tome 1 tableta (40 mg en total) por va oral diariamente. (Take 1 tablet (40 mg total) by mouth daily.)   lactulose 10 GM/15ML solution Commonly known as: CHRONULAC Take 45 mLs (30 g total) by mouth 3 (three) times daily.   multivitamin with minerals Tabs tablet Tome 1 tableta por va oral diariamente. (Take 1 tablet by mouth daily.)   ondansetron 4 MG tablet Commonly known as: ZOFRAN Take 1 tablet (4 mg total) by mouth every 6 (six) hours as needed for nausea.   pantoprazole 40 MG tablet Commonly known as: PROTONIX Take 1 tablet (40 mg total) by mouth 2 (two) times daily before a meal.  rifaximin 550 MG Tabs tablet Commonly known as: XIFAXAN Tome 1 tableta (550 mg en total) por va oral 2 (dos) veces al [employer]. (Take 1 tablet (550 mg total) by mouth 2 (two) times daily.)   spironolactone 100 MG tablet Commonly known as: ALDACTONE Tome 1 tableta (100 mg en total) por va oral diariamente. (Take 1 tablet (100 mg total) by mouth daily.)   tamsulosin 0.4 MG Caps capsule Commonly known as: FLOMAX Tome 1 cpsula (0,4 mg en total) por va oral al da. (Take 1  capsule (0.4 mg total) by mouth daily.)   thiamine 100 MG tablet Commonly known as: Vitamin B-1 Tome 1 tableta (100 mg en total) por va oral diariamente. (Take 1 tablet (100 mg total) by mouth daily.)        Discharge Exam: Filed Weights   09/12/23 1613  Weight: 74.4 kg   GENERAL: Middle-age male in no acute distress  LUNGS: Normal breath sounds bilaterally CARDIOVASCULAR: Regular rate and rhythm, S1, S2 normal.  ABDOMEN: Soft, distended, nontender EXTREMITIES: No pedal edema, cyanosis, or clubbing.  NEUROLOGIC: Cranial nerves II through XII are intact.  Nonfocal PSYCHIATRIC: Normal mood  Imaging Studies: CT ABDOMEN PELVIS W CONTRAST  Result Date: 09/12/2023 CLINICAL DATA:  Generalized abdominal pain, hypotension, lactic acidosis, history of cirrhosis EXAM: CT ABDOMEN AND PELVIS WITH CONTRAST TECHNIQUE: Multidetector CT imaging of the abdomen and pelvis was performed using the standard protocol following bolus administration of intravenous contrast. RADIATION DOSE REDUCTION: This exam was performed according to the departmental dose-optimization program which includes automated exposure control, adjustment of the mA and/or kV according to patient size and/or use of iterative reconstruction technique. CONTRAST:  OMNIPAQUE IOHEXOL 300 MG/ML  SOLN COMPARISON:  CT 07/25/2019. FINDINGS: Lower chest: Borderline cardiomegaly with coronary artery calcifications. Trace pleural thickening and basilar atelectasis. Hepatobiliary: Cirrhotic hepatic morphology with nodular contours. No evidence of focal hepatic lesion. Calcified gallstone without pericholecystic inflammatory change. No intra or extrahepatic biliary ductal dilatation. Pancreas: Parenchymal atrophy. No ductal dilatation or inflammation. Spleen: Nonenlarged. Small cleft posteriorly. No focal abnormality. Adrenals/Urinary Tract: No adrenal nodule. No hydronephrosis, renal calculi, or suspicious renal abnormality. Partially distended  urinary bladder, normal for degree of distension. Stomach/Bowel: Bowel assessment is limited in the absence of enteric contrast and presence of ascites. There are paraesophageal and perigastric varices. Fluid within the stomach which is not abnormally distended. The small bowel is diffusely fluid-filled, dilated in the central abdomen up to 4.5 cm. There is no discrete transition point to suggest obstruction. Normal appendix visualized. There is formed stool in the ascending colon. Mild gaseous distension of transverse colon. Liquid stool in the descending colon with question of wall hyperemia. No bowel pneumatosis. Vascular/Lymphatic: Small amount of nonocclusive thrombus in the extrahepatic main portal vein, for example series 2, image 22-26. Small volume nonocclusive thrombus in the superior mesenteric vein, series 2, image 37. Paraesophageal and perigastric varices. No portal venous or mesenteric gas. Normal caliber abdominal aorta. Limited assessment for adenopathy in the setting of ascites. Reproductive: Prostate is unremarkable. Other: Moderate volume abdominopelvic ascites. Generalized mesenteric edema with small volume mesenteric ascites. Ascites tracks into the inguinal canals, more so on the left. No free intra-abdominal air. Musculoskeletal: Scattered Schmorl's nodes in the spine. There are no acute or suspicious osseous abnormalities. IMPRESSION: 1. 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 2. Cirrhosis with sequela of portal hypertension including moderate volume abdominopelvic ascites, paraesophageal and perigastric varices. 3. Diffusely fluid-filled small bowel,  dilated in the central abdomen up to 4.5 cm. No discrete transition point to suggest obstruction. Findings may represent ileus or enteritis. 4. Liquid stool in the descending colon with question of wall hyperemia. Consider colitis. 5. Cholelithiasis. 6. Aortic Atherosclerosis  (ICD10-I70.0). Coronary artery calcifications. Electronically Signed   By: Narda Rutherford M.D.   On: 09/12/2023 21:05   DG Chest Port 1 View  Result Date: 09/12/2023 CLINICAL DATA:  Sepsis EXAM: PORTABLE CHEST 1 VIEW COMPARISON:  Chest x-ray 09/10/2023 FINDINGS: The heart size and mediastinal contours are within normal limits. Both lungs are clear. The visualized skeletal structures are unremarkable. IMPRESSION: No active disease. Electronically Signed   By: Darliss Cheney M.D.   On: 09/12/2023 19:39   US Paracentesis  Result Date: 09/11/2023 INDICATION: Patient with a history of alcoholic cirrhosis with recurrent ascites. Therapeutic paracentesis requested. EXAM: ULTRASOUND GUIDED PARACENTESIS MEDICATIONS: 1% lidocaine 10 mL COMPLICATIONS: None immediate. PROCEDURE: Informed written consent was obtained from the patient after a discussion of the risks, benefits and alternatives to treatment. A timeout was performed prior to the initiation of the procedure. Initial ultrasound scanning demonstrates a large amount of ascites within the right lower abdominal quadrant. The right lower abdomen was prepped and draped in the usual sterile fashion. 1% lidocaine was used for local anesthesia. Following this, a 19 gauge, 7-cm, Yueh catheter was introduced. An ultrasound image was saved for documentation purposes. The paracentesis was performed. The catheter was removed and a dressing was applied. The patient tolerated the procedure well without immediate post procedural complication. Patient received post-procedure intravenous albumin; see nursing notes for details. FINDINGS: A total of approximately 6.5 L of clear yellow fluid was removed. IMPRESSION: Successful ultrasound-guided paracentesis yielding 6.5 liters of peritoneal fluid. Procedure performed by Alwyn Ren NP PLAN: The patient has previously been evaluated by the [facility], and deemed not a  candidate for intervention. Please see Dr. Jerrye Noble note from 02/15/22. Electronically Signed   By: Malachy Moan M.D.   On: 09/11/2023 16:22   DG Chest Port 1 View  Result Date: 09/10/2023 CLINICAL DATA:  [id number] Aspiration of gastric contents [id number] EXAM: PORTABLE CHEST 1 VIEW COMPARISON:  09/07/2023 FINDINGS: The heart size and mediastinal contours are within normal limits. Aortic atherosclerosis. Low lung volumes. No focal airspace consolidation, pleural effusion, or pneumothorax. The visualized skeletal structures are unremarkable. IMPRESSION: Low lung volumes. No acute cardiopulmonary findings. Electronically Signed   By: Duanne Guess D.O.   On: 09/10/2023 16:08   CT Head Wo Contrast  Result Date: 09/07/2023 CLINICAL DATA:  Mental status change, unknown cause EXAM: CT HEAD WITHOUT CONTRAST TECHNIQUE: Contiguous axial images were obtained from the base of the skull through the vertex without intravenous contrast. RADIATION DOSE REDUCTION: This exam was performed according to the departmental dose-optimization program which includes automated exposure control, adjustment of the mA and/or kV according to patient size and/or use of iterative reconstruction technique. COMPARISON:  CT Head 07/24/22 FINDINGS: Brain: No evidence of acute infarction, hemorrhage, hydrocephalus, extra-axial collection or mass lesion/mass effect. There is sequela of mild chronic microvascular ischemic change. Vascular: No hyperdense vessel or unexpected calcification. Skull: Normal. Negative for fracture or focal lesion. Sinuses/Orbits: No middle ear or mastoid effusion. Paranasal sinuses are notable for mucosal thickening in the bilateral maxillary sinuses. There is a chronic fracture of the lamina papyracea on the right. Dysconjugate gaze. Orbits are otherwise unremarkable. Other: None. IMPRESSION: No acute intracranial abnormality. Electronically Signed   By: Lorenza Cambridge  M.D.   On: 09/07/2023 17:47   DG Ankle 2 Views  Right  Result Date: 09/07/2023 CLINICAL DATA:  Bruising. EXAM: RIGHT ANKLE - 2 VIEW COMPARISON:  None Available. FINDINGS: Chronic ankylosis of the distal tibiofibular syndesmosis. No acute fracture or dislocation. Soft tissues are radiographically unremarkable. IMPRESSION: No acute fracture or dislocation. Electronically Signed   By: Minerva Fester M.D.   On: 09/07/2023 17:33   DG Chest Port 1 View  Result Date: 09/07/2023 CLINICAL DATA:  Weakness. Questionable sepsis, evaluate for abnormality EXAM: PORTABLE CHEST 1 VIEW COMPARISON:  07/24/2022 FINDINGS: Low lung volumes. Stable cardiomediastinal silhouette. Elevated right hemidiaphragm. No focal consolidation, pleural effusion, or pneumothorax. No displaced rib fractures. IMPRESSION: No active disease. Electronically Signed   By: Minerva Fester M.D.   On: 09/07/2023 17:31    Microbiology: Results for orders placed or performed during the hospital encounter of 09/12/23  Culture, blood (Routine x 2)     Status: None (Preliminary result)   Collection Time: 09/12/23  4:17 PM   Specimen: BLOOD  Result Value Ref Range Status   Specimen Description BLOOD BLOOD LEFT HAND  Final   Special Requests   Final    BOTTLES DRAWN AEROBIC AND ANAEROBIC Blood Culture adequate volume   Culture   Final    NO GROWTH < 12 HOURS Performed at [facility], [address], [place], [place] [address]    Report Status PENDING  Incomplete  Resp panel by RT-PCR (RSV, Flu A&B, Covid) Anterior Nasal Swab     Status: None   Collection Time: 09/12/23  5:19 PM   Specimen: Anterior Nasal Swab  Result Value Ref Range Status   SARS Coronavirus 2 by RT PCR NEGATIVE NEGATIVE Final    Comment: (NOTE) SARS-CoV-2 target nucleic acids are NOT DETECTED.  The SARS-CoV-2 RNA is generally detectable in upper respiratory specimens during the acute phase of infection. The lowest concentration of SARS-CoV-2 viral copies this assay can detect is 138 copies/mL. A  negative result does not preclude SARS-Cov-2 infection and should not be used as the sole basis for treatment or other patient management decisions. A negative result may occur with  improper specimen collection/handling, submission of specimen other than nasopharyngeal swab, presence of viral mutation(s) within the areas targeted by this assay, and inadequate number of viral copies(<138 copies/mL). A negative result must be combined with clinical observations, patient history, and epidemiological information. The expected result is Negative.  Fact Sheet for Patients:  BloggerCourse.com  Fact Sheet for Healthcare Providers:  SeriousBroker.it  This test is no t yet approved or cleared by the [place] FDA and  has been authorized for detection and/or diagnosis of SARS-CoV-2 by FDA under an Emergency Use Authorization (EUA). This EUA will remain  in effect (meaning this test can be used) for the duration of the COVID-19 declaration under Section 564(b)(1) of the Act, 21 U.S.C.section 360bbb-3(b)(1), unless the authorization is terminated  or revoked sooner.       Influenza A by PCR NEGATIVE NEGATIVE Final   Influenza B by PCR NEGATIVE NEGATIVE Final    Comment: (NOTE) The Xpert Xpress SARS-CoV-2/FLU/RSV plus assay is intended as an aid in the diagnosis of influenza from Nasopharyngeal swab specimens and should not be used as a sole basis for treatment. Nasal washings and aspirates are unacceptable for Xpert Xpress SARS-CoV-2/FLU/RSV testing.  Fact Sheet for Patients: BloggerCourse.com  Fact Sheet for Healthcare Providers: SeriousBroker.it  This test is not yet approved or cleared by the [place] FDA and has been authorized for detection and/or diagnosis of SARS-CoV-2 by FDA under an Emergency Use Authorization (EUA). This EUA will remain in effect (meaning this test can  be used) for the duration of the COVID-19 declaration under Section 564(b)(1) of the Act, 21 U.S.C. section 360bbb-3(b)(1), unless the authorization is terminated or revoked.     Resp Syncytial Virus by PCR NEGATIVE NEGATIVE Final    Comment: (NOTE) Fact Sheet for Patients: BloggerCourse.com  Fact Sheet for Healthcare Providers: SeriousBroker.it  This test is not yet approved or cleared by the [place] FDA and has been authorized for detection and/or diagnosis of SARS-CoV-2 by FDA under an Emergency Use Authorization (EUA). This EUA will remain in effect (meaning this test can be used) for the duration of the COVID-19 declaration under Section 564(b)(1) of the Act, 21 U.S.C. section 360bbb-3(b)(1), unless the authorization is terminated or revoked.  Performed at [facility], [address] [place], [place] [address]   Culture, blood (Routine x 2)     Status: None (Preliminary result)   Collection Time: 09/12/23  7:29 PM   Specimen: BLOOD  Result Value Ref Range Status   Specimen Description BLOOD BLOOD LEFT ARM  Final   Special Requests   Final    BOTTLES DRAWN AEROBIC AND ANAEROBIC Blood Culture adequate volume   Culture   Final    NO GROWTH < 12 HOURS Performed at [facility], [address] [place], [place] [address]    Report Status PENDING  Incomplete    Labs: CBC: Recent Labs  Lab 09/07/23 1611 09/08/23 0539 09/08/23 1107 09/08/23 1753 09/09/23 2056 09/10/23 0537 09/11/23 0647 09/12/23 1748 09/13/23 0601  WBC 7.6   < > 9.1  --   --  5.7 6.8 5.2 6.8  NEUTROABS 5.2  --   --   --   --   --   --  4.1  --   HGB 10.1*   < > 9.5*   < > 8.9* 9.0* 8.8* 8.6* 8.0*  HCT 28.3*   < > 26.2*   < > 26.1* 26.4* 26.6* 25.6* 23.3*  MCV 93.4   < > 97.0  --   --  96.4 98.9 100.4* 101.7*  PLT 134*   < > 118*  --   --  99* 104* 94* 93*   < > = values in this interval not displayed.   Basic  Metabolic Panel: Recent Labs  Lab 09/09/23 1728 09/10/23 0537 09/11/23 0647 09/12/23 1918 09/13/23 0601  NA 142 138 135 128* 134*  K 2.9* 3.8 4.0 3.3* 3.6  CL 114* 111 109 100 108  CO2 21* 19* 18* 18* 20*  GLUCOSE 136* 111* 275* 140* 99  BUN 32* 32* 42* 47* 42*  CREATININE 1.31* 1.25* 1.37* 1.55* 1.24  CALCIUM 8.0* 7.6* 7.9* 7.8* 7.5*   Liver Function Tests: Recent Labs  Lab 09/08/23 0539 09/09/23 1728 09/10/23 0537 09/11/23 0647 09/12/23 1918  AST 39 25 21 23 21   ALT 17 16 14 14 13   ALKPHOS 83 78 73 81 65  BILITOT 4.9* 3.7* 2.9* 2.8* 2.5*  PROT 6.8 6.5 6.2* 6.4* 5.4*  ALBUMIN 2.4* 2.5* 2.3* 2.7* 2.2*   CBG: Recent Labs  Lab 09/10/23 1308  GLUCAP 107*    Discharge time spent:  35 minutes.  Signed: Loyce Dys, MD [facility] 09/13/2023

## 2023-09-14 ENCOUNTER — Telehealth: Payer: Self-pay | Admitting: Medical Oncology

## 2023-09-14 LAB — BLOOD CULTURE ID PANEL (REFLEXED) - BCID2

## 2023-09-14 NOTE — Telephone Encounter (Signed)
Positive blood culture called to RN- Per MD Isaacs, blood culture appears to be a contaminated specimen, no further recommendations.

## 2023-09-16 LAB — CULTURE, BLOOD (ROUTINE X 2): Special Requests: ADEQUATE

## 2023-09-17 LAB — CULTURE, BLOOD (ROUTINE X 2)
Culture: NO GROWTH
Special Requests: ADEQUATE

## 2023-09-20 ENCOUNTER — Inpatient Hospital Stay: Payer: Self-pay

## 2023-09-20 ENCOUNTER — Other Ambulatory Visit: Payer: Self-pay

## 2023-09-20 ENCOUNTER — Inpatient Hospital Stay
Admission: EM | Admit: 2023-09-20 | Discharge: 2023-09-28 | DRG: 432 | Disposition: A | Discharge status: Home / Self Care | Payer: Self-pay | Attending: Student in an Organized Health Care Education/Training Program | Admitting: Student in an Organized Health Care Education/Training Program

## 2023-09-20 ENCOUNTER — Emergency Department: Payer: Self-pay

## 2023-09-20 DIAGNOSIS — G312 Degeneration of nervous system due to alcohol: Secondary | ICD-10-CM

## 2023-09-20 DIAGNOSIS — K766 Portal hypertension: Secondary | ICD-10-CM | POA: Diagnosis present

## 2023-09-20 DIAGNOSIS — E440 Moderate protein-calorie malnutrition: Secondary | ICD-10-CM | POA: Diagnosis present

## 2023-09-20 DIAGNOSIS — X58XXXA Exposure to other specified factors, initial encounter: Secondary | ICD-10-CM | POA: Diagnosis present

## 2023-09-20 DIAGNOSIS — Z888 Allergy status to other drugs, medicaments and biological substances status: Secondary | ICD-10-CM

## 2023-09-20 DIAGNOSIS — E119 Type 2 diabetes mellitus without complications: Secondary | ICD-10-CM | POA: Diagnosis present

## 2023-09-20 DIAGNOSIS — R64 Cachexia: Secondary | ICD-10-CM | POA: Diagnosis present

## 2023-09-20 DIAGNOSIS — K7682 Hepatic encephalopathy: Principal | ICD-10-CM | POA: Diagnosis present

## 2023-09-20 DIAGNOSIS — Z79899 Other long term (current) drug therapy: Secondary | ICD-10-CM

## 2023-09-20 DIAGNOSIS — Z7401 Bed confinement status: Secondary | ICD-10-CM

## 2023-09-20 DIAGNOSIS — D689 Coagulation defect, unspecified: Secondary | ICD-10-CM | POA: Diagnosis present

## 2023-09-20 DIAGNOSIS — N179 Acute kidney failure, unspecified: Secondary | ICD-10-CM | POA: Diagnosis present

## 2023-09-20 DIAGNOSIS — K767 Hepatorenal syndrome: Secondary | ICD-10-CM | POA: Diagnosis present

## 2023-09-20 DIAGNOSIS — D638 Anemia in other chronic diseases classified elsewhere: Secondary | ICD-10-CM | POA: Diagnosis present

## 2023-09-20 DIAGNOSIS — R1310 Dysphagia, unspecified: Secondary | ICD-10-CM | POA: Diagnosis present

## 2023-09-20 DIAGNOSIS — K7031 Alcoholic cirrhosis of liver with ascites: Principal | ICD-10-CM | POA: Diagnosis present

## 2023-09-20 DIAGNOSIS — E860 Dehydration: Secondary | ICD-10-CM | POA: Diagnosis present

## 2023-09-20 DIAGNOSIS — D6959 Other secondary thrombocytopenia: Secondary | ICD-10-CM | POA: Diagnosis present

## 2023-09-20 DIAGNOSIS — M8438XA Stress fracture, other site, initial encounter for fracture: Secondary | ICD-10-CM | POA: Diagnosis present

## 2023-09-20 DIAGNOSIS — I8510 Secondary esophageal varices without bleeding: Secondary | ICD-10-CM | POA: Diagnosis present

## 2023-09-20 DIAGNOSIS — K567 Ileus, unspecified: Secondary | ICD-10-CM | POA: Diagnosis present

## 2023-09-20 DIAGNOSIS — Z6824 Body mass index (BMI) 24.0-24.9, adult: Secondary | ICD-10-CM

## 2023-09-20 DIAGNOSIS — G9340 Encephalopathy, unspecified: Secondary | ICD-10-CM | POA: Diagnosis present

## 2023-09-20 LAB — BODY FLUID CELL COUNT WITH DIFFERENTIAL
Eos, Fluid: 0 %
Lymphs, Fluid: 56 %
Monocyte-Macrophage-Serous Fluid: 37 %
Neutrophil Count, Fluid: 7 %
Total Nucleated Cell Count, Fluid: 100 uL

## 2023-09-20 LAB — URINALYSIS, ROUTINE W REFLEX MICROSCOPIC
Bilirubin Urine: NEGATIVE
Glucose, UA: NEGATIVE mg/dL
Ketones, ur: NEGATIVE mg/dL
Nitrite: NEGATIVE
Protein, ur: NEGATIVE mg/dL
Specific Gravity, Urine: 1.006 (ref 1.005–1.030)
pH: 5 (ref 5.0–8.0)

## 2023-09-20 LAB — COMPREHENSIVE METABOLIC PANEL
ALT: 21 U/L (ref 0–44)
AST: 40 U/L (ref 15–41)
Albumin: 2.5 g/dL — ABNORMAL LOW (ref 3.5–5.0)
Alkaline Phosphatase: 123 U/L (ref 38–126)
Anion gap: 8 (ref 5–15)
BUN: 24 mg/dL — ABNORMAL HIGH (ref 6–20)
CO2: 20 mmol/L — ABNORMAL LOW (ref 22–32)
Calcium: 7.9 mg/dL — ABNORMAL LOW (ref 8.9–10.3)
Chloride: 106 mmol/L (ref 98–111)
Creatinine, Ser: 1.15 mg/dL (ref 0.61–1.24)
GFR, Estimated: >60 mL/min (ref >60)
Glucose, Bld: 120 mg/dL — ABNORMAL HIGH (ref 70–99)
Potassium: 3.8 mmol/L (ref 3.5–5.1)
Sodium: 134 mmol/L — ABNORMAL LOW (ref 135–145)
Total Bilirubin: 3.6 mg/dL — ABNORMAL HIGH (ref 0.3–1.2)
Total Protein: 7.6 g/dL (ref 6.5–8.1)

## 2023-09-20 LAB — CBC WITH DIFFERENTIAL/PLATELET
Abs Immature Granulocytes: 0.05 10*3/uL (ref 0.00–0.07)
Basophils Absolute: 0.1 10*3/uL (ref 0.0–0.1)
Basophils Relative: 1 %
Eosinophils Absolute: 0.1 10*3/uL (ref 0.0–0.5)
Eosinophils Relative: 1 %
HCT: 29.9 % — ABNORMAL LOW (ref 39.0–52.0)
Hemoglobin: 10.2 g/dL — ABNORMAL LOW (ref 13.0–17.0)
Immature Granulocytes: 1 %
Lymphocytes Relative: 19 %
Lymphs Abs: 1.7 10*3/uL (ref 0.7–4.0)
MCH: 33.1 pg (ref 26.0–34.0)
MCHC: 34.1 g/dL (ref 30.0–36.0)
MCV: 97.1 fL (ref 80.0–100.0)
Monocytes Absolute: 0.8 10*3/uL (ref 0.1–1.0)
Monocytes Relative: 9 %
Neutro Abs: 6.2 10*3/uL (ref 1.7–7.7)
Neutrophils Relative %: 69 %
Platelets: 166 10*3/uL (ref 150–400)
RBC: 3.08 MIL/uL — ABNORMAL LOW (ref 4.22–5.81)
RDW: 14.7 % (ref 11.5–15.5)
WBC: 8.9 10*3/uL (ref 4.0–10.5)
nRBC: 0 % (ref 0.0–0.2)

## 2023-09-20 LAB — GRAM STAIN: Gram Stain: NONE SEEN

## 2023-09-20 LAB — PROTIME-INR
INR: 2 — ABNORMAL HIGH (ref 0.8–1.2)
Prothrombin Time: 22.7 s — ABNORMAL HIGH (ref 11.4–15.2)

## 2023-09-20 LAB — HEMOGLOBIN AND HEMATOCRIT, BLOOD
HCT: 26.4 % — ABNORMAL LOW (ref 39.0–52.0)
Hemoglobin: 9.5 g/dL — ABNORMAL LOW (ref 13.0–17.0)

## 2023-09-20 LAB — AMMONIA: Ammonia: 113 umol/L — ABNORMAL HIGH (ref 9–35)

## 2023-09-20 LAB — PATHOLOGIST SMEAR REVIEW

## 2023-09-20 MED ORDER — RIFAXIMIN 550 MG PO TABS
550.0000 mg | ORAL_TABLET | Freq: Two times a day (BID) | ORAL | Status: DC
  Administered 2023-09-20 – 2023-09-21 (×3): 550 mg via ORAL
  Filled 2023-09-20 (×3): qty 1

## 2023-09-20 MED ORDER — ENSURE ENLIVE PO LIQD
237.0000 mL | Freq: Three times a day (TID) | ORAL | Status: DC
  Administered 2023-09-20 – 2023-09-21 (×5): 237 mL via ORAL

## 2023-09-20 MED ORDER — ACETAMINOPHEN 650 MG RE SUPP: 650.0000 mg | Freq: Four times a day (QID) | RECTAL | Status: DC | PRN

## 2023-09-20 MED ORDER — FUROSEMIDE 40 MG PO TABS: 40.0000 mg | ORAL_TABLET | Freq: Every day | ORAL | Status: DC

## 2023-09-20 MED ORDER — LACTULOSE 10 GM/15ML PO SOLN
30.0000 g | Freq: Once | ORAL | Status: AC
  Administered 2023-09-20: 30 g via ORAL
  Filled 2023-09-20: qty 60

## 2023-09-20 MED ORDER — ONDANSETRON HCL 4 MG/2ML IJ SOLN
4.0000 mg | Freq: Four times a day (QID) | INTRAMUSCULAR | Status: DC | PRN
  Administered 2023-09-20 – 2023-09-24 (×3): 4 mg via INTRAVENOUS
  Filled 2023-09-20 (×3): qty 2

## 2023-09-20 MED ORDER — TAMSULOSIN HCL 0.4 MG PO CAPS
0.4000 mg | ORAL_CAPSULE | Freq: Every day | ORAL | Status: DC
  Administered 2023-09-20 – 2023-09-25 (×5): 0.4 mg via ORAL
  Filled 2023-09-20 (×5): qty 1

## 2023-09-20 MED ORDER — PANTOPRAZOLE SODIUM 40 MG PO TBEC
40.0000 mg | DELAYED_RELEASE_TABLET | Freq: Two times a day (BID) | ORAL | Status: DC
  Administered 2023-09-20 – 2023-09-21 (×3): 40 mg via ORAL
  Filled 2023-09-20 (×4): qty 1

## 2023-09-20 MED ORDER — CARVEDILOL 6.25 MG PO TABS
6.2500 mg | ORAL_TABLET | Freq: Two times a day (BID) | ORAL | Status: DC
  Administered 2023-09-20 – 2023-09-21 (×2): 6.25 mg via ORAL
  Filled 2023-09-20 (×3): qty 1

## 2023-09-20 MED ORDER — LACTULOSE 10 GM/15ML PO SOLN
30.0000 g | Freq: Three times a day (TID) | ORAL | Status: DC
  Administered 2023-09-20 – 2023-09-21 (×3): 30 g via ORAL
  Filled 2023-09-20 (×4): qty 60

## 2023-09-20 MED ORDER — FUROSEMIDE 10 MG/ML IJ SOLN
40.0000 mg | Freq: Two times a day (BID) | INTRAMUSCULAR | Status: DC
  Administered 2023-09-20 – 2023-09-21 (×4): 40 mg via INTRAVENOUS
  Filled 2023-09-20 (×4): qty 4

## 2023-09-20 MED ORDER — SPIRONOLACTONE 25 MG PO TABS
100.0000 mg | ORAL_TABLET | Freq: Every day | ORAL | Status: DC
  Administered 2023-09-20 – 2023-09-21 (×2): 100 mg via ORAL
  Filled 2023-09-20 (×2): qty 4

## 2023-09-20 MED ORDER — PHYTONADIONE 5 MG PO TABS
5.0000 mg | ORAL_TABLET | Freq: Once | ORAL | Status: AC
  Administered 2023-09-20: 5 mg via ORAL
  Filled 2023-09-20: qty 1

## 2023-09-20 MED ORDER — ACETAMINOPHEN 325 MG PO TABS
650.0000 mg | ORAL_TABLET | Freq: Four times a day (QID) | ORAL | Status: DC | PRN
  Administered 2023-09-24: 650 mg via ORAL
  Filled 2023-09-20 (×2): qty 2

## 2023-09-20 MED ORDER — LIDOCAINE HCL (PF) 1 % IJ SOLN
10.0000 mL | Freq: Once | INTRAMUSCULAR | Status: AC
  Administered 2023-09-20: 10 mL via INTRADERMAL

## 2023-09-20 MED ORDER — SODIUM CHLORIDE 0.9 % IV SOLN
2.0000 g | INTRAVENOUS | Status: DC
  Administered 2023-09-20 – 2023-09-25 (×6): 2 g via INTRAVENOUS
  Filled 2023-09-20 (×7): qty 20

## 2023-09-20 MED ORDER — ONDANSETRON HCL 4 MG PO TABS
4.0000 mg | ORAL_TABLET | Freq: Four times a day (QID) | ORAL | Status: DC | PRN
  Administered 2023-09-21: 4 mg via ORAL
  Filled 2023-09-20: qty 1

## 2023-09-20 NOTE — ED Notes (Signed)
Interpreter @ bs pt declines EKG

## 2023-09-20 NOTE — Procedures (Signed)
PROCEDURE SUMMARY:  Successful ultrasound guided paracentesis from the right lower quadrant.  Yielded 5.4 L of clear yellow fluid.  No immediate complications.  The patient tolerated the procedure well.   Specimen sent for labs.  EBL < 2 mL  The patient has previously been evaluated by the [facility], and deemed not a candidate for intervention.    Alwyn Ren, [place] [phone] 09/20/2023, 3:07 PM

## 2023-09-20 NOTE — ED Provider Notes (Signed)
[facility] Provider Note    Event Date/Time   First MD Initiated Contact with Patient 09/20/23 [phone]     (approximate)   History   Altered Mental Status   HPI  Shawn Torres is a 57 y.o. male who presents to the ED for evaluation of Altered Mental Status   I reviewed medical DC summary from 1 week ago.  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  Ascites, DM, recent EGD with esophagitis.  Patient presents from home via EMS for altered mentation.  He was wandering on the house, apparently getting ready for work in the middle of the night (which is inappropriate).  EMS found him with a pair of gym shorts pulled over his head and shoulders.   Here in the ED he is encephalopathic and is not sure why he is here.  Denies pain   Physical Exam   Triage Vital Signs: ED Triage Vitals  Encounter Vitals Group     BP      Systolic BP Percentile      Diastolic BP Percentile      Pulse      Resp      Temp      Temp src      SpO2      Weight      Height      Head Circumference      Peak Flow      Pain Score      Pain Loc      Pain Education      Exclude from Growth Chart     Most recent vital signs: Vitals:   09/20/23 0600 09/20/23 0615  BP: 134/79   Pulse: 83 81  Resp: 19 15  Temp:    SpO2: 100% 98%    General: Awake, no distress.  Disoriented CV:  Good peripheral perfusion.  Resp:  Normal effort.  Abd:  Mild distention, nontender and soft MSK:  No deformity noted.  Neuro:  No focal deficits appreciated. Other:     ED Results / Procedures / Treatments   Labs (all labs ordered are listed, but only abnormal results are displayed) Labs Reviewed  PROTIME-INR - Abnormal; Notable for the following components:      Result Value   Prothrombin Time 22.7 (*)    INR 2.0 (*)    All other components within normal limits  AMMONIA - Abnormal; Notable for the following components:   Ammonia 113 (*)    All  other components within normal limits  COMPREHENSIVE METABOLIC PANEL - Abnormal; Notable for the following components:   Sodium 134 (*)    CO2 20 (*)    Glucose, Bld 120 (*)    BUN 24 (*)    Calcium 7.9 (*)    Albumin 2.5 (*)    Total Bilirubin 3.6 (*)    All other components within normal limits  CBC WITH DIFFERENTIAL/PLATELET  URINALYSIS, ROUTINE W REFLEX MICROSCOPIC    EKG   RADIOLOGY CT head interpreted by me without evidence of acute intracranial pathology CXR interpreted by me without evidence of acute cardiopulmonary pathology.  Official radiology report(s): DG Chest Portable 1 View  Result Date: 09/20/2023 CLINICAL DATA:  Altered mental status. History of alcoholism and cirrhosis EXAM: PORTABLE CHEST 1 VIEW COMPARISON:  Abdominal CT from 8 days ago. FINDINGS: Low volume chest. There is no edema, consolidation, effusion, or pneumothorax. Normal heart size and mediastinal contours. IMPRESSION: No active disease. Electronically Signed  By: Tiburcio Pea M.D.   On: 09/20/2023 06:04   CT HEAD WO CONTRAST ( )  Result Date: 09/20/2023 CLINICAL DATA:  Nonfocal altered mental status.  Alcoholic. EXAM: CT HEAD WITHOUT CONTRAST TECHNIQUE: Contiguous axial images were obtained from the base of the skull through the vertex without intravenous contrast. RADIATION DOSE REDUCTION: This exam was performed according to the departmental dose-optimization program which includes automated exposure control, adjustment of the mA and/or kV according to patient size and/or use of iterative reconstruction technique. COMPARISON:  09/07/2023 FINDINGS: Brain: No evidence of acute infarction, hemorrhage, hydrocephalus, extra-axial collection or mass lesion/mass effect. Patchy low-density in the cerebral white matter, chronic and usually from small vessel ischemia. Vascular: No hyperdense vessel or unexpected calcification. Skull: Normal. Negative for fracture or focal lesion. Sinuses/Orbits: Interval  opacification of the paranasal sinuses with fluid levels diffusely. Remote blowout fracture of the right medial orbit wall. IMPRESSION: 1. Acute sinusitis with fluid level seen diffusely, new since recent head CT comparison. 2. No acute intracranial finding.  Chronic white matter disease. Electronically Signed   By: Tiburcio Pea M.D.   On: 09/20/2023 05:32    PROCEDURES and INTERVENTIONS:  .1-3 Lead EKG Interpretation  Performed by: Delton Prairie, MD Authorized by: Delton Prairie, MD     Interpretation: normal     ECG rate:  80   ECG rate assessment: normal     Rhythm: sinus rhythm     Ectopy: none     Conduction: normal     Medications  lactulose (CHRONULAC) 10 GM/15ML solution 30 g (has no administration in time range)     IMPRESSION / MDM / ASSESSMENT AND PLAN / ED COURSE  I reviewed the triage vital signs and the nursing notes.  Differential diagnosis includes, but is not limited to, sepsis, ICH, hepatic encephalopathy  {Patient presents with symptoms of an acute illness or injury that is potentially life-threatening.  Patient presents with recurrence of hepatic encephalopathy.  No clear signs of neurologic deficits or trauma.  Ammonia quite elevated so we will start lactulose.  Metabolic panel with no significant acute features.  INR elevated at 2.0.  CBC pending.  Imaging benign.  Will consult with medicine for readmission considering his significant encephalopathy.  Clinical Course as of 09/20/23 0651  Wed Sep 20, 2023  1610 reassessed [DS]    Clinical Course User Index [DS] Delton Prairie, MD     FINAL CLINICAL IMPRESSION(S) / ED DIAGNOSES   Final diagnoses:  Hepatic encephalopathy (HCC)     Rx / DC Orders   ED Discharge Orders     None        Note:  This document was prepared using Dragon voice recognition software and may include unintentional dictation errors.   Delton Prairie, MD 09/20/23 [phone]

## 2023-09-20 NOTE — ED Notes (Signed)
[id number] interpreter

## 2023-09-20 NOTE — ED Triage Notes (Addendum)
Pt to ED via EMS from home, per ems pt was recently admitted here for hepatic encephalopathy, daughter states pt has been becoming increasingly more confused at home. Pt able to answer orientation questions correctly  in triage however arrives wearing shorts around his neck. Pt denies pain. Pt has hx cirrhosis

## 2023-09-20 NOTE — H&P (Signed)
History and Physical    Shawn Torres NWG:[id number] DOB: 1966/05/12 DOA: 09/20/2023  PCP: Kurtis Bushman, MD (Confirm with patient/family/NH records and if not entered, this has to be entered at [facility] point of entry) Patient coming from: Home  I have personally briefly reviewed patient's old medical records in [facility]  Chief Complaint: Confusion  HPI: Shawn Torres is a 57 y.o. male with medical history significant of alcoholic liver cirrhosis, with recurrent hepatic encephalopathy and ascites, recent upper GI bleed secondary to portal vein hypertension, sent by manage member for evaluation of confusion, worsening of nauseous vomiting.  Patient is confused unable to provide any history, or history provided by daughter over the phone.  Daughter reported that patient was recently hospitalized and discharged about 1 week ago.  Initially patient was compliant with all his cirrhosis medications including lactulose and diuresis however starting about 3 to 4 days ago patient started to feel nausea with frequent vomitings, including meals and pills as well as lactulose.  Daughter did not see any coffee-ground stuff but only undigested food and pills.  But the patient had no complaints about abdominal pain for the last few days.  And yesterday evening, patient started to become confused and started calling names of his brothers and sisters in the rooms who however about the [place].  Daughter reported the patient has not been drinking any alcohol.   ED Course: Afebrile, none tachycardia, blood pressure 140/80.  CT head negative for acute findings but acute sinusitis.  Blood work showed INR 2.0, ammonia 113, creatinine 1.1, albumin 2.5.  Review of Systems: Unable to perform, patient is confused.  Past Medical History:  Diagnosis Date   Ascites    Cirrhosis, alcoholic (HCC)    Diabetes mellitus without complication (HCC)    Memory loss     Past Surgical History:  Procedure  Laterality Date   ESOPHAGOGASTRODUODENOSCOPY N/A 10/10/2021   Procedure: ESOPHAGOGASTRODUODENOSCOPY (EGD);  Surgeon: Midge Minium, MD;  Location: [facility] ENDOSCOPY;  Service: Endoscopy;  Laterality: N/A;   ESOPHAGOGASTRODUODENOSCOPY N/A 12/27/2021   Procedure: ESOPHAGOGASTRODUODENOSCOPY (EGD);  Surgeon: [name], DO;  Location: [facility] ENDOSCOPY;  Service: Gastroenterology;  Laterality: N/A;  Spanish Interpreter   ESOPHAGOGASTRODUODENOSCOPY N/A 01/20/2022   Procedure: ESOPHAGOGASTRODUODENOSCOPY (EGD);  Surgeon: [name], DO;  Location: [facility] ENDOSCOPY;  Service: Gastroenterology;  Laterality: N/A;  Spanish Interpreter   ESOPHAGOGASTRODUODENOSCOPY N/A 02/03/2022   Procedure: ESOPHAGOGASTRODUODENOSCOPY (EGD);  Surgeon: [name], DO;  Location: [facility] ENDOSCOPY;  Service: Gastroenterology;  Laterality: N/A;  Needs Spanish Interpreter - (prefers) all calls to daughter (she speaks [place])   ESOPHAGOGASTRODUODENOSCOPY (EGD) WITH PROPOFOL N/A 12/11/2021   Procedure: ESOPHAGOGASTRODUODENOSCOPY (EGD) WITH PROPOFOL;  Surgeon: [name], DO;  Location: [facility] ENDOSCOPY;  Service: Gastroenterology;  Laterality: N/A;   ESOPHAGOGASTRODUODENOSCOPY (EGD) WITH PROPOFOL N/A 09/10/2023   Procedure: ESOPHAGOGASTRODUODENOSCOPY (EGD) WITH PROPOFOL;  Surgeon: Toledo, Boykin Nearing, MD;  Location: [facility] ENDOSCOPY;  Service: Gastroenterology;  Laterality: N/A;   IR PARACENTESIS  02/15/2022     reports that he has never smoked. He has never used smokeless tobacco. He reports that he does not currently use alcohol. He reports that he does not use drugs.  Allergies  Allergen Reactions   Albumin Gc Rash    Patient gets rash with albumin. See media tab picture dated 12/24/21. Needs pre-meds w/ albumin (benadryl) to avoid allergic reaction.     Family History  Family history unknown: Yes     Prior to Admission medications   Medication Sig  Start Date End Date Taking? Authorizing Provider   carvedilol (COREG) 6.25 MG tablet Take 1 tablet (6.25 mg total) by mouth 2 (two) times daily. Patient not taking: Reported on 09/13/2023 09/11/23   Arnetha Courser, MD  feeding supplement (ENSURE ENLIVE / ENSURE PLUS) LIQD Take 237 mLs by mouth 3 (three) times daily between meals. 07/27/22   Arnetha Courser, MD  furosemide (LASIX) 40 MG tablet Take 1 tablet (40 mg total) by mouth daily. Patient not taking: Reported on 09/13/2023 09/11/23   Arnetha Courser, MD  lactulose (CHRONULAC) 10 GM/15ML solution Take 45 mLs (30 g total) by mouth 3 (three) times daily. Patient not taking: Reported on 09/13/2023 09/11/23 09/10/24  Arnetha Courser, MD  Multiple Vitamin (MULTIVITAMIN WITH MINERALS) TABS tablet Take 1 tablet by mouth daily. Patient not taking: Reported on 09/13/2023 09/11/23   Arnetha Courser, MD  ondansetron (ZOFRAN) 4 MG tablet Take 1 tablet (4 mg total) by mouth every 6 (six) hours as needed for nausea. Patient not taking: Reported on 09/13/2023 09/11/23   Arnetha Courser, MD  pantoprazole (PROTONIX) 40 MG tablet Take 1 tablet (40 mg total) by mouth 2 (two) times daily before a meal. Patient not taking: Reported on 09/13/2023 09/11/23   Arnetha Courser, MD  rifaximin (XIFAXAN) 550 MG TABS tablet Take 1 tablet (550 mg total) by mouth 2 (two) times daily. Patient not taking: Reported on 09/13/2023 09/11/23   Arnetha Courser, MD  spironolactone (ALDACTONE) 100 MG tablet Take 1 tablet (100 mg total) by mouth daily. Patient not taking: Reported on 09/13/2023 09/11/23   Arnetha Courser, MD  tamsulosin (FLOMAX) 0.4 MG CAPS capsule Take 1 capsule (0.4 mg total) by mouth daily. Patient not taking: Reported on 09/13/2023 09/11/23   Arnetha Courser, MD  thiamine (VITAMIN B-1) 100 MG tablet Take 1 tablet (100 mg total) by mouth daily. Patient not taking: Reported on 09/08/2023 07/28/22   Arnetha Courser, MD    Physical Exam: Vitals:   09/20/23 0507 09/20/23 0600 09/20/23 0615 09/20/23 0700  BP: (!) 143/84 134/79  139/88   Pulse: 85 83 81 87  Resp: 15 19 15 18   Temp: 98 F (36.7 C)     TempSrc: Oral     SpO2: 99% 100% 98% 100%  Weight:      Height:        Constitutional: NAD, calm, comfortable Vitals:   09/20/23 0507 09/20/23 0600 09/20/23 0615 09/20/23 0700  BP: (!) 143/84 134/79  139/88  Pulse: 85 83 81 87  Resp: 15 19 15 18   Temp: 98 F (36.7 C)     TempSrc: Oral     SpO2: 99% 100% 98% 100%  Weight:      Height:       Eyes: PERRL, lids and conjunctivae normal ENMT: Mucous membranes are moist. Posterior pharynx clear of any exudate or lesions.Normal dentition.  Neck: normal, supple, no masses, no thyromegaly Respiratory: clear to auscultation bilaterally, no wheezing, no crackles. Normal respiratory effort. No accessory muscle use.  Cardiovascular: Regular rate and rhythm, no murmurs / rubs / gallops. No extremity edema. 2+ pedal pulses. No carotid bruits.  Abdomen: Distended, nontender, positive ascites, no masses palpated. No hepatosplenomegaly. Bowel sounds positive.  Musculoskeletal: no clubbing / cyanosis. No joint deformity upper and lower extremities. Good ROM, no contractures. Normal muscle tone.  Skin: no rashes, lesions, ulcers. No induration Neurologic: CN 2-12 grossly intact. Sensation intact, DTR normal. Strength 5/5 in all 4.  Psychiatric: Oriented to himself, confused about time and place  Labs on Admission: I have personally reviewed following labs and imaging studies  CBC: Recent Labs  Lab 09/20/23 0502  WBC 8.9  NEUTROABS 6.2  HGB 10.2*  HCT 29.9*  MCV 97.1  PLT 166   Basic Metabolic Panel: Recent Labs  Lab 09/20/23 0502  NA 134*  K 3.8  CL 106  CO2 20*  GLUCOSE 120*  BUN 24*  CREATININE 1.15  CALCIUM 7.9*   GFR: Estimated Creatinine Clearance: 68.5 mL/min (by C-G formula based on SCr of 1.15 mg/dL). Liver Function Tests: Recent Labs  Lab 09/20/23 0502  AST 40  ALT 21  ALKPHOS 123  BILITOT 3.6*  PROT 7.6  ALBUMIN 2.5*   No results for  input(s): "LIPASE", "AMYLASE" in the last 168 hours. Recent Labs  Lab 09/20/23 0526  AMMONIA 113*   Coagulation Profile: Recent Labs  Lab 09/20/23 0502  INR 2.0*   Cardiac Enzymes: No results for input(s): "CKTOTAL", "CKMB", "CKMBINDEX", "TROPONINI" in the last 168 hours. BNP (last 3 results) No results for input(s): "PROBNP" in the last 8760 hours. HbA1C: No results for input(s): "HGBA1C" in the last 72 hours. CBG: No results for input(s): "GLUCAP" in the last 168 hours. Lipid Profile: No results for input(s): "CHOL", "HDL", "LDLCALC", "TRIG", "CHOLHDL", "LDLDIRECT" in the last 72 hours. Thyroid Function Tests: No results for input(s): "TSH", "T4TOTAL", "FREET4", "T3FREE", "THYROIDAB" in the last 72 hours. Anemia Panel: No results for input(s): "VITAMINB12", "FOLATE", "FERRITIN", "TIBC", "IRON", "RETICCTPCT" in the last 72 hours. Urine analysis:    Component Value Date/Time   COLORURINE YELLOW (A) 09/12/2023 2342   APPEARANCEUR CLEAR (A) 09/12/2023 2342   LABSPEC 1.035 (H) 09/12/2023 2342   PHURINE 5.0 09/12/2023 2342   GLUCOSEU NEGATIVE 09/12/2023 2342   HGBUR NEGATIVE 09/12/2023 2342   BILIRUBINUR NEGATIVE 09/12/2023 2342   KETONESUR NEGATIVE 09/12/2023 2342   PROTEINUR NEGATIVE 09/12/2023 2342   NITRITE NEGATIVE 09/12/2023 2342   LEUKOCYTESUR TRACE (A) 09/12/2023 2342    Radiological Exams on Admission: DG Chest Portable 1 View  Result Date: 09/20/2023 CLINICAL DATA:  Altered mental status. History of alcoholism and cirrhosis EXAM: PORTABLE CHEST 1 VIEW COMPARISON:  Abdominal CT from 8 days ago. FINDINGS: Low volume chest. There is no edema, consolidation, effusion, or pneumothorax. Normal heart size and mediastinal contours. IMPRESSION: No active disease. Electronically Signed   By: Tiburcio Pea M.D.   On: 09/20/2023 06:04   CT HEAD WO CONTRAST ( )  Result Date: 09/20/2023 CLINICAL DATA:  Nonfocal altered mental status.  Alcoholic. EXAM: CT HEAD WITHOUT  CONTRAST TECHNIQUE: Contiguous axial images were obtained from the base of the skull through the vertex without intravenous contrast. RADIATION DOSE REDUCTION: This exam was performed according to the departmental dose-optimization program which includes automated exposure control, adjustment of the mA and/or kV according to patient size and/or use of iterative reconstruction technique. COMPARISON:  09/07/2023 FINDINGS: Brain: No evidence of acute infarction, hemorrhage, hydrocephalus, extra-axial collection or mass lesion/mass effect. Patchy low-density in the cerebral white matter, chronic and usually from small vessel ischemia. Vascular: No hyperdense vessel or unexpected calcification. Skull: Normal. Negative for fracture or focal lesion. Sinuses/Orbits: Interval opacification of the paranasal sinuses with fluid levels diffusely. Remote blowout fracture of the right medial orbit wall. IMPRESSION: 1. Acute sinusitis with fluid level seen diffusely, new since recent head CT comparison. 2. No acute intracranial finding.  Chronic white matter disease. Electronically Signed   By: Tiburcio Pea M.D.   On: 09/20/2023 05:32    EKG: [place]  Assessment/Plan Principal Problem:   Encephalopathy Active Problems:   Acute hepatic encephalopathy (HCC)  (please populate well all problems here in Problem List. (For example, if patient is on BP meds at home and you resume or decide to hold them, it is a problem that needs to be her. Same for CAD, COPD, HLD and so on)  Acute hepatic encephalopathy secondary to noncoherent with lactulose -Resume lactulose 30 g 3 times daily -Neurochecks every 2 hours x 1 day until ammonia level trending down -IR paracentesis to rule out SBP.  Start prophylactic ceftriaxone  Acute on chronic ascites -IR paracentesis to rule out SBP -Change Lasix to IV Lasix 40 mg twice daily, continue p.o. spironolactone  Intractable nauseous vomiting -Ordered abdominal x-ray to rule out  SBO -No coffee-ground material as per reported by family, arguing against acute upper GI bleed.  H&H stable, hold off Sandostatin -Reviewed patient history showed patient had recent coffee-ground vomiting secondary to grade 1 esophageal varices on EGD.  Acute on chronic cirrhosis decompensation Acute on chronic coagulopathy -Vitamin K x 1, repeat and tomorrow -Encephalopathy management as above, continue rifaximin   DVT prophylaxis: SCD Code Status: Full code Family Communication: Daughter over the phone Disposition Plan: Patient sick with acute recurrent hepatic encephalopathy with significant elevation of ammonium level, and cirrhosis decompensation requiring inpatient management including IV Lasix, expect more than 2 midnight hospital stay Consults called: None Admission status: MedSurg admission   Emeline General MD [facility] Pager [phone]  09/20/2023, 8:47 AM

## 2023-09-21 ENCOUNTER — Inpatient Hospital Stay: Payer: Self-pay

## 2023-09-21 DIAGNOSIS — R112 Nausea with vomiting, unspecified: Secondary | ICD-10-CM

## 2023-09-21 DIAGNOSIS — K7682 Hepatic encephalopathy: Secondary | ICD-10-CM

## 2023-09-21 DIAGNOSIS — K7031 Alcoholic cirrhosis of liver with ascites: Secondary | ICD-10-CM

## 2023-09-21 DIAGNOSIS — D689 Coagulation defect, unspecified: Secondary | ICD-10-CM

## 2023-09-21 LAB — COMPREHENSIVE METABOLIC PANEL
ALT: 16 U/L (ref 0–44)
AST: 30 U/L (ref 15–41)
Albumin: 2.1 g/dL — ABNORMAL LOW (ref 3.5–5.0)
Alkaline Phosphatase: 105 U/L (ref 38–126)
Anion gap: 8 (ref 5–15)
BUN: 34 mg/dL — ABNORMAL HIGH (ref 6–20)
CO2: 21 mmol/L — ABNORMAL LOW (ref 22–32)
Calcium: 7.6 mg/dL — ABNORMAL LOW (ref 8.9–10.3)
Chloride: 106 mmol/L (ref 98–111)
Creatinine, Ser: 2.1 mg/dL — ABNORMAL HIGH (ref 0.61–1.24)
GFR, Estimated: 36 mL/min — ABNORMAL LOW (ref >60)
Glucose, Bld: 127 mg/dL — ABNORMAL HIGH (ref 70–99)
Potassium: 4.6 mmol/L (ref 3.5–5.1)
Sodium: 135 mmol/L (ref 135–145)
Total Bilirubin: 2.4 mg/dL — ABNORMAL HIGH (ref 0.3–1.2)
Total Protein: 7 g/dL (ref 6.5–8.1)

## 2023-09-21 LAB — CBC WITH DIFFERENTIAL/PLATELET
Abs Immature Granulocytes: 0.01 10*3/uL (ref 0.00–0.07)
Basophils Absolute: 0 10*3/uL (ref 0.0–0.1)
Basophils Relative: 1 %
Eosinophils Absolute: 0 10*3/uL (ref 0.0–0.5)
Eosinophils Relative: 0 %
HCT: 27.8 % — ABNORMAL LOW (ref 39.0–52.0)
Hemoglobin: 10 g/dL — ABNORMAL LOW (ref 13.0–17.0)
Immature Granulocytes: 0 %
Lymphocytes Relative: 24 %
Lymphs Abs: 1.6 10*3/uL (ref 0.7–4.0)
MCH: 36.6 pg — ABNORMAL HIGH (ref 26.0–34.0)
MCHC: 36 g/dL (ref 30.0–36.0)
MCV: 101.8 fL — ABNORMAL HIGH (ref 80.0–100.0)
Monocytes Absolute: 0.5 10*3/uL (ref 0.1–1.0)
Monocytes Relative: 7 %
Neutro Abs: 4.7 10*3/uL (ref 1.7–7.7)
Neutrophils Relative %: 68 %
Platelets: 127 10*3/uL — ABNORMAL LOW (ref 150–400)
RBC: 2.73 MIL/uL — ABNORMAL LOW (ref 4.22–5.81)
RDW: 14.9 % (ref 11.5–15.5)
WBC: 6.9 10*3/uL (ref 4.0–10.5)
nRBC: 0 % (ref 0.0–0.2)

## 2023-09-21 LAB — GLUCOSE, CAPILLARY: Glucose-Capillary: 111 mg/dL — ABNORMAL HIGH (ref 70–99)

## 2023-09-21 LAB — PROTIME-INR
INR: 2.3 — ABNORMAL HIGH (ref 0.8–1.2)
Prothrombin Time: 25.1 s — ABNORMAL HIGH (ref 11.4–15.2)

## 2023-09-21 LAB — MAGNESIUM: Magnesium: 2.1 mg/dL (ref 1.7–2.4)

## 2023-09-21 LAB — AMMONIA
Ammonia: 92 umol/L — ABNORMAL HIGH (ref 9–35)
Ammonia: 113 umol/L — ABNORMAL HIGH (ref 9–35)

## 2023-09-21 LAB — PHOSPHORUS: Phosphorus: 4.6 mg/dL (ref 2.5–4.6)

## 2023-09-21 MED ORDER — GUAIFENESIN 100 MG/5ML PO LIQD
5.0000 mL | ORAL | Status: DC | PRN
  Administered 2023-09-24: 5 mL via ORAL
  Filled 2023-09-21: qty 10

## 2023-09-21 MED ORDER — LACTULOSE ENEMA
300.0000 mL | Freq: Once | ORAL | Status: AC
  Administered 2023-09-22: 300 mL via RECTAL
  Filled 2023-09-21: qty 300

## 2023-09-21 MED ORDER — B COMPLEX-C PO TABS
1.0000 | ORAL_TABLET | Freq: Every day | ORAL | Status: DC
  Filled 2023-09-21: qty 1

## 2023-09-21 MED ORDER — THIAMINE HCL 100 MG/ML IJ SOLN
100.0000 mg | Freq: Every day | INTRAMUSCULAR | Status: DC
  Administered 2023-09-22 – 2023-09-28 (×6): 100 mg via INTRAVENOUS
  Filled 2023-09-21 (×6): qty 2

## 2023-09-21 NOTE — Plan of Care (Signed)
Patient confused at baseline. Alert to self. Ate well breakfast and lunch.Refused dinner and his evening medications. Daughter at bedside reports he seems more confused. Voided x2. Provider notified of daughters concerns. Will continue to monitor.  Problem: Clinical Measurements: Goal: Will remain free from infection Outcome: Progressing   Problem: Nutrition: Goal: Adequate nutrition will be maintained Outcome: Progressing

## 2023-09-21 NOTE — Progress Notes (Signed)
Upon entering the room at 2026, and attempting to use the Video Interpreter to communicate with pt, the pt was mumbling something that wasn't in [place] or a coherent Spanish.  After using the interpreter to speak loudly at the the pt in Spanish, and I had sternal rubbed along with shouted at the pt, the pt would not open his eyes, only withdraw from pain.  This RN got with the charge nurse to do an assessment of the pt's condition, as a second set of eyes.  Vitals were obtained and the were all stable.  Pt's blood sugar was obtained which was also within desired range.  The pt's pupils were assessed with a pen light to see they were fixed and sclera were yellow.  Pt continues to withdraw from discomfort, but is only using his right arm.  Left arm appeared to be flaccid and not withdrawing from any sort of pain. Rapid Response was initiated just in case pt had a neurological episode that could be not assessed due to pt's inability to follow commands and not responding appropriately.

## 2023-09-21 NOTE — Plan of Care (Signed)
Pt's cognitive level doesn't allow for retention of education provided.  Problem: Education: Goal: Knowledge of General Education information will improve Description: Including pain rating scale, medication(s)/side effects and non-pharmacologic comfort measures Outcome: Not Progressing   Problem: Health Behavior/Discharge Planning: Goal: Ability to manage health-related needs will improve Outcome: Not Progressing  --------------------------------------------------------------------------------- Problem: Clinical Measurements: Goal: Ability to maintain clinical measurements within normal limits will improve Outcome: Progressing Goal: Will remain free from infection Outcome: Progressing Goal: Diagnostic test results will improve Outcome: Progressing Goal: Respiratory complications will improve Outcome: Progressing Goal: Cardiovascular complication will be avoided Outcome: Progressing   Problem: Activity: Goal: Risk for activity intolerance will decrease Outcome: Progressing   Problem: Nutrition: Goal: Adequate nutrition will be maintained Outcome: Progressing   Problem: Coping: Goal: Level of anxiety will decrease Outcome: Progressing   Problem: Elimination: Goal: Will not experience complications related to bowel motility Outcome: Progressing Goal: Will not experience complications related to urinary retention Outcome: Progressing   Problem: Pain Management: Goal: General experience of comfort will improve Outcome: Progressing   Problem: Safety: Goal: Ability to remain free from injury will improve Outcome: Progressing   Problem: Skin Integrity: Goal: Risk for impaired skin integrity will decrease Outcome: Progressing

## 2023-09-21 NOTE — Progress Notes (Signed)
Progress Note   Patient: Shawn Torres ZOX:[id number] DOB: Jul 02, 1966 DOA: 09/20/2023     1 DOS: the patient was seen and examined on 09/21/2023   Brief hospital course: Brenyn Patchett is a 57 y.o. male with medical history significant of alcoholic liver cirrhosis, with recurrent hepatic encephalopathy and ascites, recent upper GI bleed secondary to portal vein hypertension, sent by manage member for evaluation of confusion, worsening of nauseous vomiting.  Patient is admitted to the hospitalist service for further management evaluation of hepatic encephalopathy, intractable nausea, vomiting, ascites due to cirhosis, possible SBP  Assessment and Plan: Acute hepatic encephalopathy  Continue lactulose 30 g 3 times daily, titrate to 2-3 bowel movements. Neurochecks every 2 hours x 1 day until ammonia level trending down S/p IR paracentesis with 5.4 L fluid removal.  Total nucleated cell 100. On prophylactic ceftriaxone pending cultures.   Acute on chronic ascites Acute on chronic cirrhosis decompensation Acute on chronic coagulopathy S/p Vitamin K x 2. INR 2.3, no active bleeding at this time. Continue rifaximin S/p IR paracentesis with 5.4 L fluid removal. Continue IV Lasix 40 mg twice daily, continue p.o. spironolactone   Intractable nausea vomiting Better today, able to tolerate clears. No coffee-ground material as per reported by family, H&H stable, hold off Sandostatin Reviewed patient history showed patient had recent coffee-ground vomiting secondary to grade 1 esophageal varices on EGD.      Out of bed to chair. Incentive spirometry. Nursing supportive care. Fall, aspiration precautions. DVT prophylaxis   Code Status: Full Code  Subjective: Patient is seen and examined today morning. He is sleepy, able to answer me. Has nausea, no vomiting. Able to tolerate clears. No confusion.  Physical Exam: Vitals:   09/20/23 1629 09/20/23 2111 09/21/23 0507 09/21/23 0748  BP:  118/77 (!) 126/93 96/71 94/63   Pulse: 72 87 86 85  Resp: 16 18 18 18   Temp: 97.7 F (36.5 C) 98.6 F (37 C) 97.7 F (36.5 C) 97.7 F (36.5 C)  TempSrc: Oral  Oral   SpO2: 100%  100% 99%  Weight:      Height:        General - Middle aged Hispanic male, apparent distress due to nausea HEENT - PERRLA, EOMI, atraumatic head, non tender sinuses. Lung - Clear, basal rales, rhonchi. Heart - S1, S2 heard, no murmurs, rubs, trace pedal edema. Abdomen - Soft, non tender, distended, bowel sounds good Neuro - Alert, awake and oriented, non focal exam. Skin - Warm and dry.  Data Reviewed:      Latest Ref Rng & Units 09/20/2023    2:50 PM 09/20/2023    5:02 AM 09/13/2023    6:01 AM  CBC  WBC 4.0 - 10.5 K/uL  8.9  6.8   Hemoglobin 13.0 - 17.0 g/dL 9.5  81.1  8.0   Hematocrit 39.0 - 52.0 % 26.4  29.9  23.3   Platelets 150 - 400 K/uL  166  93       Latest Ref Rng & Units 09/20/2023    5:02 AM 09/13/2023    6:01 AM 09/12/2023    7:18 PM  BMP  Glucose 70 - 99 mg/dL 914  99  782   BUN 6 - 20 mg/dL 24  42  47   Creatinine 0.61 - 1.24 mg/dL 9.56  2.13  0.86   Sodium 135 - 145 mmol/L 134  134  128   Potassium 3.5 - 5.1 mmol/L 3.8  3.6  3.3   Chloride 98 -  111 mmol/L 106  108  100   CO2 22 - 32 mmol/L 20  20  18    Calcium 8.9 - 10.3 mg/dL 7.9  7.5  7.8    US Paracentesis  Result Date: 09/20/2023 INDICATION: Patient with a history of alcoholic cirrhosis with recurrent ascites. Diagnostic and therapeutic paracentesis requested. EXAM: ULTRASOUND GUIDED PARACENTESIS MEDICATIONS: 1% lidocaine 10 mL COMPLICATIONS: None immediate. PROCEDURE: Informed written consent was obtained from the patient after a discussion of the risks, benefits and alternatives to treatment. A timeout was performed prior to the initiation of the procedure. Initial ultrasound scanning demonstrates a large amount of ascites within the right lower abdominal quadrant. The right lower abdomen was prepped and draped in the  usual sterile fashion. 1% lidocaine was used for local anesthesia. Following this, a 19 gauge, 7-cm, Yueh catheter was introduced. An ultrasound image was saved for documentation purposes. The paracentesis was performed. The catheter was removed and a dressing was applied. The patient tolerated the procedure well without immediate post procedural complication. Patient received post-procedure intravenous albumin; see nursing notes for details. FINDINGS: A total of approximately 5.4 L of clear yellow fluid was removed. Samples were sent to the laboratory as requested by the clinical team. IMPRESSION: Successful ultrasound-guided paracentesis yielding 5.4 liters of peritoneal fluid. Procedure performed by Alwyn Ren NP PLAN: The patient has previously been evaluated by the [facility], and deemed not a candidate for intervention. Electronically Signed   By: Olive Bass M.D.   On: 09/20/2023 15:27   DG Abd 2 Views  Result Date: 09/20/2023 CLINICAL DATA:  Nausea and vomiting. EXAM: ABDOMEN - 2 VIEW COMPARISON:  CT 09/12/2023 FINDINGS: Relative increased density to the abdomen overall consistent with known history of ascites. There are several air-filled mildly distended loops and mildly dilated loops of small bowel in the midabdomen, similar to previous. Scattered colonic stool. Distended stomach. No obvious free air seen beneath the diaphragm on the upright views. Somewhat limited overall evaluation due to portable technique is under penetrated. IMPRESSION: Persistent mildly dilated loops of small bowel in the midabdomen. Gas and stool along the colon. Increased density of the abdomen again could be the sequela of ascites. Electronically Signed   By: Karen Kays M.D.   On: 09/20/2023 10:02   DG Chest Portable 1 View  Result Date: 09/20/2023 CLINICAL DATA:  Altered mental status. History of alcoholism and cirrhosis EXAM: PORTABLE CHEST 1 VIEW  COMPARISON:  Abdominal CT from 8 days ago. FINDINGS: Low volume chest. There is no edema, consolidation, effusion, or pneumothorax. Normal heart size and mediastinal contours. IMPRESSION: No active disease. Electronically Signed   By: Tiburcio Pea M.D.   On: 09/20/2023 06:04   CT HEAD WO CONTRAST ( )  Result Date: 09/20/2023 CLINICAL DATA:  Nonfocal altered mental status.  Alcoholic. EXAM: CT HEAD WITHOUT CONTRAST TECHNIQUE: Contiguous axial images were obtained from the base of the skull through the vertex without intravenous contrast. RADIATION DOSE REDUCTION: This exam was performed according to the departmental dose-optimization program which includes automated exposure control, adjustment of the mA and/or kV according to patient size and/or use of iterative reconstruction technique. COMPARISON:  09/07/2023 FINDINGS: Brain: No evidence of acute infarction, hemorrhage, hydrocephalus, extra-axial collection or mass lesion/mass effect. Patchy low-density in the cerebral white matter, chronic and usually from small vessel ischemia. Vascular: No hyperdense vessel or unexpected calcification. Skull: Normal. Negative for fracture or focal lesion. Sinuses/Orbits: Interval opacification of the paranasal sinuses with fluid levels  diffusely. Remote blowout fracture of the right medial orbit wall. IMPRESSION: 1. Acute sinusitis with fluid level seen diffusely, new since recent head CT comparison. 2. No acute intracranial finding.  Chronic white matter disease. Electronically Signed   By: Tiburcio Pea M.D.   On: 09/20/2023 05:32     Family Communication: No family at bedside.  Disposition: Status is: Inpatient Remains inpatient appropriate because: nausea, abdominal pain, hepatic encephalopathy.  Planned Discharge Destination: Home with Home Health     Time spent: 39 minutes  Author: Marcelino Duster, MD 09/21/2023 3:14 PM Secure chat 7am to 7pm For on call review [email or web address].

## 2023-09-21 NOTE — Progress Notes (Signed)
Rapid Response Event Note   Reason for Call :  Patient was unresponsive per Primary RN   Initial Focused Assessment: Sternal rub patient mumbled and wouldn't open his eyes.  He would state "night, night."  He had increased confusion from previous night.  He had been refusing some of his Lactulose doses.  Ammonia level went from 114 to 92.    Interventions:  On-call MD present ordered labs   Plan of Care:  pending labs for next action to be taken    Event Summary:  Patients vital signs were BP 107/71(83) HR 65 SpO2 100% RA and CBG 111  MD Notified: 0844 Call [id number] Arrival [id number] End [id number]  Peter Minium, RN

## 2023-09-22 ENCOUNTER — Inpatient Hospital Stay: Payer: Self-pay

## 2023-09-22 DIAGNOSIS — N179 Acute kidney failure, unspecified: Secondary | ICD-10-CM

## 2023-09-22 DIAGNOSIS — K567 Ileus, unspecified: Secondary | ICD-10-CM

## 2023-09-22 DIAGNOSIS — E440 Moderate protein-calorie malnutrition: Secondary | ICD-10-CM

## 2023-09-22 DIAGNOSIS — E119 Type 2 diabetes mellitus without complications: Secondary | ICD-10-CM

## 2023-09-22 LAB — GLUCOSE, CAPILLARY
Glucose-Capillary: 104 mg/dL — ABNORMAL HIGH (ref 70–99)
Glucose-Capillary: 106 mg/dL — ABNORMAL HIGH (ref 70–99)
Glucose-Capillary: 109 mg/dL — ABNORMAL HIGH (ref 70–99)
Glucose-Capillary: 127 mg/dL — ABNORMAL HIGH (ref 70–99)
Glucose-Capillary: 153 mg/dL — ABNORMAL HIGH (ref 70–99)
Glucose-Capillary: 87 mg/dL (ref 70–99)
Glucose-Capillary: 92 mg/dL (ref 70–99)
Glucose-Capillary: 96 mg/dL (ref 70–99)

## 2023-09-22 LAB — AMMONIA: Ammonia: 122 umol/L — ABNORMAL HIGH (ref 9–35)

## 2023-09-22 LAB — BLOOD GAS, VENOUS
Acid-base deficit: 2 mmol/L (ref 0.0–2.0)
Acid-base deficit: 6.1 mmol/L — ABNORMAL HIGH (ref 0.0–2.0)
Bicarbonate: 20.4 mmol/L (ref 20.0–28.0)
Bicarbonate: 15.6 mmol/L — ABNORMAL LOW (ref 20.0–28.0)
O2 Saturation: 28.6 %
O2 Saturation: 96.9 %
Patient temperature: 37
Patient temperature: 37
pCO2, Ven: 28 mm[Hg] — ABNORMAL LOW (ref 44–60)
pCO2, Ven: 22 mm[Hg] — ABNORMAL LOW (ref 44–60)
pH, Ven: 7.47 — ABNORMAL HIGH (ref 7.25–7.43)
pH, Ven: 7.46 — ABNORMAL HIGH (ref 7.25–7.43)
pO2, Ven: 77 mm[Hg] — ABNORMAL HIGH (ref 32–45)

## 2023-09-22 LAB — CBC
HCT: 25.8 % — ABNORMAL LOW (ref 39.0–52.0)
Hemoglobin: 9.2 g/dL — ABNORMAL LOW (ref 13.0–17.0)
MCH: 33 pg (ref 26.0–34.0)
MCHC: 35.7 g/dL (ref 30.0–36.0)
MCV: 92.5 fL (ref 80.0–100.0)
Platelets: 128 10*3/uL — ABNORMAL LOW (ref 150–400)
RBC: 2.79 MIL/uL — ABNORMAL LOW (ref 4.22–5.81)
RDW: 14.9 % (ref 11.5–15.5)
WBC: 8.4 10*3/uL (ref 4.0–10.5)
nRBC: 0 % (ref 0.0–0.2)

## 2023-09-22 LAB — MRSA NEXT GEN BY PCR, NASAL: MRSA by PCR Next Gen: NOT DETECTED

## 2023-09-22 LAB — CBC WITH DIFFERENTIAL/PLATELET
Abs Immature Granulocytes: 0.04 10*3/uL (ref 0.00–0.07)
Basophils Absolute: 0.1 10*3/uL (ref 0.0–0.1)
Basophils Relative: 1 %
Eosinophils Absolute: 0 10*3/uL (ref 0.0–0.5)
Eosinophils Relative: 0 %
HCT: 28.5 % — ABNORMAL LOW (ref 39.0–52.0)
Hemoglobin: 9.9 g/dL — ABNORMAL LOW (ref 13.0–17.0)
Immature Granulocytes: 0 %
Lymphocytes Relative: 18 %
Lymphs Abs: 1.8 10*3/uL (ref 0.7–4.0)
MCH: 33.7 pg (ref 26.0–34.0)
MCHC: 34.7 g/dL (ref 30.0–36.0)
MCV: 96.9 fL (ref 80.0–100.0)
Monocytes Absolute: 0.8 10*3/uL (ref 0.1–1.0)
Monocytes Relative: 8 %
Neutro Abs: 7.1 10*3/uL (ref 1.7–7.7)
Neutrophils Relative %: 73 %
Platelets: 125 10*3/uL — ABNORMAL LOW (ref 150–400)
RBC: 2.94 MIL/uL — ABNORMAL LOW (ref 4.22–5.81)
RDW: 15 % (ref 11.5–15.5)
WBC: 9.7 10*3/uL (ref 4.0–10.5)
nRBC: 0 % (ref 0.0–0.2)

## 2023-09-22 LAB — BASIC METABOLIC PANEL
Anion gap: 12 (ref 5–15)
BUN: 36 mg/dL — ABNORMAL HIGH (ref 6–20)
CO2: 18 mmol/L — ABNORMAL LOW (ref 22–32)
Calcium: 7.6 mg/dL — ABNORMAL LOW (ref 8.9–10.3)
Chloride: 105 mmol/L (ref 98–111)
Creatinine, Ser: 2.22 mg/dL — ABNORMAL HIGH (ref 0.61–1.24)
GFR, Estimated: 34 mL/min — ABNORMAL LOW (ref >60)
Glucose, Bld: 113 mg/dL — ABNORMAL HIGH (ref 70–99)
Potassium: 3.9 mmol/L (ref 3.5–5.1)
Sodium: 135 mmol/L (ref 135–145)

## 2023-09-22 LAB — MAGNESIUM
Magnesium: 2.1 mg/dL (ref 1.7–2.4)
Magnesium: 2.1 mg/dL (ref 1.7–2.4)

## 2023-09-22 LAB — PHOSPHORUS
Phosphorus: 5 mg/dL — ABNORMAL HIGH (ref 2.5–4.6)
Phosphorus: 4.9 mg/dL — ABNORMAL HIGH (ref 2.5–4.6)

## 2023-09-22 MED ORDER — PROSOURCE TF20 ENFIT COMPATIBL EN LIQD
60.0000 mL | Freq: Two times a day (BID) | ENTERAL | Status: DC
  Administered 2023-09-22 – 2023-09-24 (×5): 60 mL
  Filled 2023-09-22 (×3): qty 60

## 2023-09-22 MED ORDER — PANTOPRAZOLE SODIUM 40 MG IV SOLR
40.0000 mg | Freq: Two times a day (BID) | INTRAVENOUS | Status: DC
  Administered 2023-09-22 – 2023-09-25 (×8): 40 mg via INTRAVENOUS
  Filled 2023-09-22 (×8): qty 10

## 2023-09-22 MED ORDER — FREE WATER
150.0000 mL | Status: DC
  Administered 2023-09-22 – 2023-09-25 (×8): 150 mL

## 2023-09-22 MED ORDER — CHLORHEXIDINE GLUCONATE CLOTH 2 % EX PADS
6.0000 | MEDICATED_PAD | Freq: Every day | CUTANEOUS | Status: DC
  Administered 2023-09-22 – 2023-09-27 (×6): 6 via TOPICAL

## 2023-09-22 MED ORDER — PIVOT 1.5 CAL PO LIQD
1000.0000 mL | ORAL | Status: DC
  Filled 2023-09-22: qty 1000

## 2023-09-22 MED ORDER — CARVEDILOL 6.25 MG PO TABS
6.2500 mg | ORAL_TABLET | Freq: Two times a day (BID) | ORAL | Status: DC
  Administered 2023-09-22 – 2023-09-25 (×6): 6.25 mg
  Filled 2023-09-22 (×6): qty 1

## 2023-09-22 MED ORDER — ALBUMIN HUMAN 25 % IV SOLN
25.0000 g | Freq: Once | INTRAVENOUS | Status: AC
  Administered 2023-09-22: 25 g via INTRAVENOUS
  Filled 2023-09-22 (×2): qty 100

## 2023-09-22 MED ORDER — OSMOLITE 1.5 CAL PO LIQD
1000.0000 mL | ORAL | Status: DC
  Administered 2023-09-22: 1000 mL

## 2023-09-22 MED ORDER — LACTULOSE ENEMA
300.0000 mL | Freq: Once | ORAL | Status: DC
  Filled 2023-09-22: qty 300

## 2023-09-22 MED ORDER — LACTULOSE ENEMA
300.0000 mL | Freq: Once | ORAL | Status: AC
  Administered 2023-09-23: 300 mL via RECTAL
  Filled 2023-09-22 (×2): qty 300

## 2023-09-22 MED ORDER — LACTULOSE 10 GM/15ML PO SOLN
20.0000 g | Freq: Three times a day (TID) | ORAL | Status: DC
  Administered 2023-09-22 – 2023-09-27 (×13): 20 g
  Filled 2023-09-22 (×14): qty 30

## 2023-09-22 MED ORDER — RIFAXIMIN 550 MG PO TABS
550.0000 mg | ORAL_TABLET | Freq: Two times a day (BID) | ORAL | Status: DC
  Administered 2023-09-22 – 2023-09-26 (×8): 550 mg
  Filled 2023-09-22 (×10): qty 1

## 2023-09-22 MED ORDER — B COMPLEX-C PO TABS
1.0000 | ORAL_TABLET | Freq: Every day | ORAL | Status: DC
  Administered 2023-09-22 – 2023-09-26 (×5): 1
  Filled 2023-09-22 (×6): qty 1

## 2023-09-22 NOTE — Progress Notes (Signed)
Initial Nutrition Assessment  DOCUMENTATION CODES:   Non-severe (moderate) malnutrition in context of chronic illness  INTERVENTION:   -Once placement confirmed via x-ray:   Initiate Osmolite 1.5 @ 20 ml/hr and increase by 10 ml every 8 hours to goal rate of 55 ml/hr.   60 ml Prosource TF BID  150 ml free water flush every 4 hours  Tube feeding regimen provides 2140 kcal (100% of needs), 123 grams of protein, and 1006 ml of H2O. Total free water: 1906 ml daily  -Monitor Mg, K, and Phos daily and replete as needed secondary to high refeeding risk -Continue 100 mg thiamine IV daily  NUTRITION DIAGNOSIS:   Moderate Malnutrition related to chronic illness (alcoholic cirrhosis) as evidenced by mild fat depletion, moderate fat depletion, moderate muscle depletion, mild muscle depletion.  GOAL:   Patient will meet greater than or equal to 90% of their needs  MONITOR:   Diet advancement, TF tolerance  REASON FOR ASSESSMENT:   Consult Assessment of nutrition requirement/status, Enteral/tube feeding initiation and management  ASSESSMENT:   Pt with medical history significant of alcoholic liver cirrhosis, with recurrent hepatic encephalopathy and ascites, recent upper GI bleed secondary to portal vein hypertension, presents for evaluation of confusion, worsening of nauseous vomiting.  Pt admitted with acute hepatic encephalopathy secondary to non adherence with lactulose.   10/30- s/p rt paracentesis (5.4 L removed) 10/31- rapid response called due to decreased mentation 11/1- NGT placed- awaiting x-ray confirmation  Reviewed I/O's: -300 ml x 24 hours and -170 ml x since admission   Pt familiar to this RD due to multiple prior admissions. Noted pt with history of noncompliance with lactulose.   Case discussed with RN. NGT has been placed and pt just had a bowel movement since being given lactulose. Pt is very lethargic at this time. No family at bedside to provide additional  history.   Pt did not arouse to voice or during exam. Plan to start TF via NGT due to inability to take PO's at this time. Pt was previously on a heart healthy diet with 2 L fluid restriction; was consuming 100% of meals.   Reviewed wt hx; pt has experienced a 14.5% wt loss over the past 9 months, which is not significant for time frame, however, concerning given pt's multiple co-morbidities.   Medications reviewed and include b-complex with vitamin C, lactulose, protonix, xifaxin, and thiamine.   Labs reviewed: CBGS: 87-109 (inpatient orders for glycemic control are none).     NUTRITION - FOCUSED PHYSICAL EXAM:  Flowsheet Row Most Recent Value  Orbital Region Moderate depletion  Upper Arm Region Moderate depletion  Thoracic and Lumbar Region Mild depletion  Buccal Region Moderate depletion  Temple Region Severe depletion  Clavicle Bone Region Moderate depletion  Clavicle and Acromion Bone Region Moderate depletion  Scapular Bone Region Moderate depletion  Dorsal Hand Moderate depletion  Patellar Region Moderate depletion  Anterior Thigh Region Moderate depletion  Posterior Calf Region Moderate depletion  Edema (RD Assessment) None  Hair Reviewed  Eyes Reviewed  Mouth Reviewed  Skin Reviewed  Nails Reviewed       Diet Order:   Diet Order             Diet Heart Room service appropriate? Yes; Fluid consistency: Thin; Fluid restriction: 2000 mL Fluid  Diet effective now                   EDUCATION NEEDS:   Not appropriate for education at this time  Skin:  Skin Assessment: Reviewed RN Assessment  Last BM:  09/21/23 (type 6)  Height:   Ht Readings from Last 1 Encounters:  09/20/23 5\' 5"  (1.651 m)    Weight:   Wt Readings from Last 1 Encounters:  09/22/23 68.4 kg    Ideal Body Weight:  61.8 kg  BMI:  Body mass index is 25.09 kg/m.  Estimated Nutritional Needs:   Kcal:  1950-2150  Protein:  105-120 grams  Fluid:  > 1.9 L    Levada Schilling, RD,  LDN, CDCES Registered Dietitian III Certified Diabetes Care and Education Specialist Please refer to [facility] for RD and/or RD on-call/weekend/after hours pager

## 2023-09-22 NOTE — Progress Notes (Incomplete)
       CROSS COVER NOTE  NAME: Shawn Torres MRN: [id number] DOB : November 02, 1966    Concern as stated by nurse / staff   Patient continues to vomit around gastric tube despite tube feedings held and tube to suction      Pertinent findings on chart review: Patient with history significant for alcohol induced liveer chirrhosis, wit recurrent ascites and hepatic encephalopathy, portal vein hypertension and recent upper GI bleed   Assessment and  Interventions   Assessment:    09/22/2023    7:00 PM 09/22/2023    6:00 PM 09/22/2023    5:00 PM  Vitals with BMI  Systolic 117 112 644  Diastolic 76 76 78  Pulse 73 73 71   Patient mentation improved from 24 hours as he is more responsive to noxious stimuli and spontaneously opening eyes. No other change in assessment KUB FINDINGS: Gas-filled nondistended colon with scattered gas in nondistended small bowel. Bowel gas is decreased since prior study. Changes are likely due to ileus. Enteric tube is again demonstrated with tip in the right upper quadrant consistent with location in the distal stomach. Calcification in the right upper quadrant consistent with gallstones. Vascular calcifications. Acute fractures are suggested in the anterior right ninth and tenth ribs. Lung bases are clear. Degenerative changes in the spine.   IMPRESSION: 1. Gas-filled nondistended colon with scattered gas in the small bowel, decreased since prior study. Changes are likely due to ileus. 2. Enteric tube tip projects over the right upper quadrant consistent with location in the distal stomach. 3. Acute appearing fractures of right anterior ninth and tenth ribs.   Plan: Ileus - keep patient npo until ileus resolves. Lactulose changed to enema route New right anterior Rib fx noted on xray - no trauma event llikely fragility fractures from cachexia, could be from forceful coughing or vomiting AKI likely hepatorenal syndrome - consider additional  albumin Maddreys score 65.4 showing poor prognosis and patient may benefit from glucocorticoid therapy - consider addition of daily prednisolone and GI consult       Donnie Mesa NP [facility] Cross Cover 7pm-7am - check amion for availability Pager [phone]

## 2023-09-22 NOTE — Progress Notes (Signed)
Progress Note   Patient: Shawn Torres:[id number] DOB: Jul 09, 1966 DOA: 09/20/2023     2 DOS: the patient was seen and examined on 09/22/2023   Brief hospital course: Shawn Torres is a 57 y.o. male with medical history significant of alcoholic liver cirrhosis, with recurrent hepatic encephalopathy and ascites, recent upper GI bleed secondary to portal vein hypertension, sent by manage member for evaluation of confusion, worsening of nauseous vomiting.  Patient is admitted to the hospitalist service for further management evaluation of hepatic encephalopathy, intractable nausea, vomiting, ascites due to cirhosis, possible SBP.  10/31 - he was more lethargic at night, transferred to ICU for close monitoring. CT head unremarkable. Ammonia level high got Lactulose PR.  11/1: Sleepy but arousable. NG tube inserted for feeds and meds.  Assessment and Plan: Acute hepatic encephalopathy  He became more lethargic, transferred to ICU. Continue lactulose 20 g 3 times daily, titrate to 2-3 bowel movements. Neurochecks every 2 hours x 1 day until ammonia level trending down. S/p IR paracentesis with 5.4 L fluid removal.  On prophylactic ceftriaxone pending fluid cultures. NG tube for feeds and meds placed. Dietician eval for tube feeding.   Acute on chronic ascites Acute on chronic cirrhosis decompensation Acute on chronic coagulopathy S/p Vitamin K x 2. no active bleeding at this time. Continue to monitor INR. Continue rifaximin S/p IR paracentesis with 5.4 L fluid removal. Continue IV Lasix 40 mg twice daily, continue p.o. spironolactone   Intractable nausea vomiting Continue antiemetics.  NG feeds as tolerated.   Chronic anemia: No coffee-ground material as per reported by family, H&H stable. Reviewed patient history showed patient had recent coffee-ground vomiting secondary to grade 1 esophageal varices on EGD.   Nutrition Documentation    Flowsheet Row ED to Hosp-Admission  (Current) from [facility]/20/20[facility] in [facility] ICU[facility]CCU  Nutrition Problem Moderate Malnutrition  Etiology chronic illness  [alcoholic cirrhosis]  Nutrition Goal Patient will meet greater than or equal to 90% of their needs  Interventions Tube feeding     Out of bed to chair. Incentive spirometry. Nursing supportive care. Fall, aspiration precautions. DVT prophylaxis   Code Status: Full Code  Subjective: Patient is seen and examined today morning. He is sleepy, arousable. Last night moved to ICU for close monitoring. NG tube placed.   Physical Exam: Vitals:   09/22/23 1200 09/22/23 1300 09/22/23 1400 09/22/23 1500  BP: (!) 96/58 100/63 108/75 115/71  Pulse: 78 68 69 69  Resp: 19 13 12 11   Temp:      TempSrc:      SpO2: 100% 100% 100% 100%  Weight:      Height:        General - Middle aged Hispanic male, apparent distress due to nausea HEENT - PERRLA, EOMI, atraumatic head, non tender sinuses. Lung - Clear, basal rales, rhonchi. Heart - S1, S2 heard, no murmurs, rubs, trace pedal edema. Abdomen - Soft, non tender, distended, bowel sounds good Neuro - sleepy, unable to do full neuro exam Skin - Warm and dry.  Data Reviewed:      Latest Ref Rng & Units 09/22/2023    4:51 AM 09/21/2023    9:06 PM 09/20/2023    2:50 PM  CBC  WBC 4.0 - 10.5 K/uL 8.4  6.9    Hemoglobin 13.0 - 17.0 g/dL 9.2  81.1  9.5   Hematocrit 39.0 - 52.0 % 25.8  27.8  26.4   Platelets 150 - 400 K/uL 128  127  Latest Ref Rng & Units 09/22/2023    4:51 AM 09/21/2023    9:06 PM 09/20/2023    5:02 AM  BMP  Glucose 70 - 99 mg/dL 564  332  951   BUN 6 - 20 mg/dL 36  34  24   Creatinine 0.61 - 1.24 mg/dL 8.84  1.66  0.63   Sodium 135 - 145 mmol/L 135  135  134   Potassium 3.5 - 5.1 mmol/L 3.9  4.6  3.8   Chloride 98 - 111 mmol/L 105  106  106   CO2 22 - 32 mmol/L 18  21  20    Calcium 8.9 - 10.3 mg/dL 7.6  7.6  7.9    DG Abd 1 View  Result Date: 09/22/2023 CLINICAL DATA:   Nasogastric tube placement. EXAM: ABDOMEN - 1 VIEW COMPARISON:  September 20, 2023. FINDINGS: Distal tip of nasogastric tube is seen in expected position of proximal stomach. Minimally dilated small bowel loops are noted suggesting ileus. IMPRESSION: Nasogastric tube tip seen in expected position of proximal stomach. Electronically Signed   By: Lupita Raider M.D.   On: 09/22/2023 14:29   CT HEAD WO CONTRAST ( )  Result Date: 09/21/2023 CLINICAL DATA:  Initial evaluation for mental status change, unknown cause. EXAM: CT HEAD WITHOUT CONTRAST TECHNIQUE: Contiguous axial images were obtained from the base of the skull through the vertex without intravenous contrast. RADIATION DOSE REDUCTION: This exam was performed according to the departmental dose-optimization program which includes automated exposure control, adjustment of the mA and/or kV according to patient size and/or use of iterative reconstruction technique. COMPARISON:  CT from 09/20/2023. FINDINGS: Brain: Examination technically limited by positioning. Cerebral volume within normal limits. Changes of chronic microvascular ischemic disease again noted. No acute intracranial hemorrhage. No acute large vessel territory infarct. No mass lesion or midline shift. No hydrocephalus or extra-axial fluid collection. Vascular: No abnormal hyperdense vessel. Skull: Scalp soft tissues and calvarium demonstrate no new finding. Sinuses/Orbits: Globes and orbital soft tissues demonstrate no acute finding. Remote posttraumatic defect noted at the right lamina papyracea. Changes of pan sinusitis, slightly improved from prior. Mastoid air cells remain clear. Other: None. IMPRESSION: 1. No acute intracranial abnormality. 2. Changes of chronic microvascular ischemic disease. 3. Pan sinusitis, slightly improved from prior. Electronically Signed   By: Rise Mu M.D.   On: 09/21/2023 23:08     Family Communication: Daughter updated over phone. She understands  and agrees.  Disposition: Status is: Inpatient Remains inpatient appropriate because: poor mental status, hepatic encephalopathy requiring ICU care.  Planned Discharge Destination: Home with Home Health    MDM level 3- His mental status overnight worsened, ammonia still elevated. Need close ICU monitoring for neurological, telemetry, electrolyte monitoring. He is at high risk for clinical deterioration.  Author: Marcelino Duster, MD 09/22/2023 4:58 PM Secure chat 7am to 7pm For on call review [email or web address].

## 2023-09-22 NOTE — Progress Notes (Signed)
Phone call to daughter to make her aware that patient has transferred to ICU14, questions answered, phone number given to daughter.

## 2023-09-23 ENCOUNTER — Inpatient Hospital Stay: Payer: Self-pay

## 2023-09-23 DIAGNOSIS — Z515 Encounter for palliative care: Secondary | ICD-10-CM

## 2023-09-23 DIAGNOSIS — K567 Ileus, unspecified: Secondary | ICD-10-CM

## 2023-09-23 DIAGNOSIS — N179 Acute kidney failure, unspecified: Secondary | ICD-10-CM

## 2023-09-23 LAB — MAGNESIUM
Magnesium: 2.1 mg/dL (ref 1.7–2.4)
Magnesium: 2.2 mg/dL (ref 1.7–2.4)

## 2023-09-23 LAB — CBC
HCT: 27.4 % — ABNORMAL LOW (ref 39.0–52.0)
Hemoglobin: 9.4 g/dL — ABNORMAL LOW (ref 13.0–17.0)
MCH: 33.3 pg (ref 26.0–34.0)
MCHC: 34.3 g/dL (ref 30.0–36.0)
MCV: 97.2 fL (ref 80.0–100.0)
Platelets: 132 10*3/uL — ABNORMAL LOW (ref 150–400)
RBC: 2.82 MIL/uL — ABNORMAL LOW (ref 4.22–5.81)
RDW: 15 % (ref 11.5–15.5)
WBC: 11 10*3/uL — ABNORMAL HIGH (ref 4.0–10.5)
nRBC: 0 % (ref 0.0–0.2)

## 2023-09-23 LAB — GLUCOSE, CAPILLARY
Glucose-Capillary: 109 mg/dL — ABNORMAL HIGH (ref 70–99)
Glucose-Capillary: 110 mg/dL — ABNORMAL HIGH (ref 70–99)
Glucose-Capillary: 125 mg/dL — ABNORMAL HIGH (ref 70–99)
Glucose-Capillary: 142 mg/dL — ABNORMAL HIGH (ref 70–99)
Glucose-Capillary: 145 mg/dL — ABNORMAL HIGH (ref 70–99)
Glucose-Capillary: 151 mg/dL — ABNORMAL HIGH (ref 70–99)

## 2023-09-23 LAB — PHOSPHORUS
Phosphorus: 4.7 mg/dL — ABNORMAL HIGH (ref 2.5–4.6)
Phosphorus: 3.9 mg/dL (ref 2.5–4.6)

## 2023-09-23 LAB — COMPREHENSIVE METABOLIC PANEL
ALT: 19 U/L (ref 0–44)
AST: 27 U/L (ref 15–41)
Albumin: 2.2 g/dL — ABNORMAL LOW (ref 3.5–5.0)
Alkaline Phosphatase: 91 U/L (ref 38–126)
Anion gap: 13 (ref 5–15)
BUN: 50 mg/dL — ABNORMAL HIGH (ref 6–20)
CO2: 16 mmol/L — ABNORMAL LOW (ref 22–32)
Calcium: 8 mg/dL — ABNORMAL LOW (ref 8.9–10.3)
Chloride: 109 mmol/L (ref 98–111)
Creatinine, Ser: 2.44 mg/dL — ABNORMAL HIGH (ref 0.61–1.24)
GFR, Estimated: 30 mL/min — ABNORMAL LOW (ref >60)
Glucose, Bld: 131 mg/dL — ABNORMAL HIGH (ref 70–99)
Potassium: 3 mmol/L — ABNORMAL LOW (ref 3.5–5.1)
Sodium: 138 mmol/L (ref 135–145)
Total Bilirubin: 2.6 mg/dL — ABNORMAL HIGH (ref 0.3–1.2)
Total Protein: 6.5 g/dL (ref 6.5–8.1)

## 2023-09-23 LAB — PROTIME-INR
INR: 2.3 — ABNORMAL HIGH (ref 0.8–1.2)
Prothrombin Time: 25.4 s — ABNORMAL HIGH (ref 11.4–15.2)

## 2023-09-23 MED ORDER — POTASSIUM CHLORIDE 10 MEQ/100ML IV SOLN
10.0000 meq | INTRAVENOUS | Status: DC
  Administered 2023-09-23 (×5): 10 meq via INTRAVENOUS
  Filled 2023-09-23 (×6): qty 100

## 2023-09-23 MED ORDER — DIPHENHYDRAMINE HCL 50 MG/ML IJ SOLN
25.0000 mg | Freq: Four times a day (QID) | INTRAMUSCULAR | Status: DC | PRN
  Administered 2023-09-23 – 2023-09-24 (×4): 25 mg via INTRAVENOUS
  Filled 2023-09-23 (×4): qty 1

## 2023-09-23 MED ORDER — ALBUMIN HUMAN 25 % IV SOLN
25.0000 g | Freq: Three times a day (TID) | INTRAVENOUS | Status: AC
  Administered 2023-09-23 – 2023-09-24 (×3): 25 g via INTRAVENOUS
  Filled 2023-09-23 (×3): qty 100

## 2023-09-23 MED ORDER — LACTULOSE ENEMA
300.0000 mL | Freq: Two times a day (BID) | ORAL | Status: DC
  Administered 2023-09-23 – 2023-09-26 (×8): 300 mL via RECTAL
  Filled 2023-09-23 (×14): qty 300

## 2023-09-23 MED ORDER — KCL IN DEXTROSE-NACL 20-5-0.9 MEQ/L-%-% IV SOLN
INTRAVENOUS | Status: AC
  Filled 2023-09-23 (×3): qty 1000

## 2023-09-23 NOTE — Plan of Care (Signed)

## 2023-09-23 NOTE — Progress Notes (Signed)
Progress Note   Patient: Shawn Torres ZOX:[id number] DOB: 24-Apr-1966 DOA: 09/20/2023     3 DOS: the patient was seen and examined on 09/23/2023   Brief hospital course: Gaetano Romberger is a 57 y.o. male with medical history significant of alcoholic liver cirrhosis, with recurrent hepatic encephalopathy and ascites, recent upper GI bleed secondary to portal vein hypertension, sent by manage member for evaluation of confusion, worsening of nauseous vomiting.  Patient is admitted to the hospitalist service for further management evaluation of hepatic encephalopathy, intractable nausea, vomiting, ascites due to cirhosis, possible SBP.  10/31 - he was more lethargic at night, transferred to ICU for close monitoring. CT head unremarkable. Ammonia level high got Lactulose PR.  11/1: Sleepy but arousable. NG tube inserted for feeds and meds.  Assessment and Plan: Acute hepatic encephalopathy  Patient opens eyes, not responding much. Continue lactulose 20 g 3 times daily, titrate to 2-3 bowel movements. Neurochecks every 2 hours x 1 day until ammonia level trending down. S/p IR paracentesis with 5.4 L fluid removal 09/20/23.  On prophylactic ceftriaxone pending fluid cultures. NG tube pulled out had to replace. Held tube feeding due to nausea.   Acute on chronic ascites S/p IR paracentesis with 5.4 L fluid removal. Held Lasix, spironolactone given AKI Albumin infusions q8hr for 1 day ordered.  Acute on chronic coagulopathy S/p Vitamin K x 2. no active bleeding at this time. Continue to monitor INR. Continue rifaximin  Acute kidney injury  Dehydration, Hepatorenal causes. Will continue gentle IV hydration. Continue to monitor daily renal function. Avoid nephrotoxic drugs.   Intractable nausea vomiting Continue antiemetics.  NG feeds held due to ileus on abdomen xray. If more awake with give oral fluids. He has loose stools on lactulose. Continue IV hydration.  Chronic anemia: No  active bleeding. H&H stable. Reviewed patient history showed patient had recent coffee-ground vomiting secondary to grade 1 esophageal varices on EGD.   Nutrition Documentation    Flowsheet Row ED to Hosp-Admission (Current) from 09/20/2023 in [facility] ICU[facility]CCU  Nutrition Problem Moderate Malnutrition  Etiology chronic illness  [alcoholic cirrhosis]  Nutrition Goal Patient will meet greater than or equal to 90% of their needs  Interventions Tube feeding     Out of bed to chair. Incentive spirometry. Nursing supportive care. Fall, aspiration precautions. DVT prophylaxis   Code Status: Full Code He had multiple admissions, MDF score 65, overall prognosis poor. Palliative care for goals of care discussion.  Subjective: Patient is seen and examined today morning. He is awake, removed NG, replaced. Last night he had vomiting, NG feeds held. Kidney function worsened. No family at bedside.  Physical Exam: Vitals:   09/23/23 0900 09/23/23 1000 09/23/23 1100 09/23/23 1200  BP: 121/75 93/61 108/67 105/72  Pulse: 78 80 84 83  Resp: 17 16 16 15   Temp:      TempSrc:      SpO2: 100% 100% 100% 100%  Weight:      Height:        General - Middle aged Hispanic male, no apparent distress HEENT - PERRLA, EOMI, atraumatic head, non tender sinuses. Lung - Clear, basal rales, rhonchi. Heart - S1, S2 heard, no murmurs, rubs, trace pedal edema. Abdomen - Soft, non tender, distended, bowel sounds good Neuro - alert, opens eyes, does not follow commands, unable to do full neuro exam Skin - Warm and dry.  Data Reviewed:      Latest Ref Rng & Units 09/23/2023    4:22  AM 09/22/2023    9:51 PM 09/22/2023    4:51 AM  CBC  WBC 4.0 - 10.5 K/uL 11.0  9.7  8.4   Hemoglobin 13.0 - 17.0 g/dL 9.4  9.9  9.2   Hematocrit 39.0 - 52.0 % 27.4  28.5  25.8   Platelets 150 - 400 K/uL 132  125  128       Latest Ref Rng & Units 09/23/2023    4:22 AM 09/22/2023    4:51 AM 09/21/2023     9:06 PM  BMP  Glucose 70 - 99 mg/dL 413  244  010   BUN 6 - 20 mg/dL 50  36  34   Creatinine 0.61 - 1.24 mg/dL 2.72  5.36  6.44   Sodium 135 - 145 mmol/L 138  135  135   Potassium 3.5 - 5.1 mmol/L 3.0  3.9  4.6   Chloride 98 - 111 mmol/L 109  105  106   CO2 22 - 32 mmol/L 16  18  21    Calcium 8.9 - 10.3 mg/dL 8.0  7.6  7.6    DG Abd 1 View  Result Date: 09/22/2023 CLINICAL DATA:  Vomiting EXAM: ABDOMEN - 1 VIEW COMPARISON:  09/22/2023 FINDINGS: Gas-filled nondistended colon with scattered gas in nondistended small bowel. Bowel gas is decreased since prior study. Changes are likely due to ileus. Enteric tube is again demonstrated with tip in the right upper quadrant consistent with location in the distal stomach. Calcification in the right upper quadrant consistent with gallstones. Vascular calcifications. Acute fractures are suggested in the anterior right ninth and tenth ribs. Lung bases are clear. Degenerative changes in the spine. IMPRESSION: 1. Gas-filled nondistended colon with scattered gas in the small bowel, decreased since prior study. Changes are likely due to ileus. 2. Enteric tube tip projects over the right upper quadrant consistent with location in the distal stomach. 3. Acute appearing fractures of right anterior ninth and tenth ribs. Electronically Signed   By: Burman Nieves M.D.   On: 09/22/2023 22:33   DG Abd 1 View  Result Date: 09/22/2023 CLINICAL DATA:  Nasogastric tube placement. EXAM: ABDOMEN - 1 VIEW COMPARISON:  September 20, 2023. FINDINGS: Distal tip of nasogastric tube is seen in expected position of proximal stomach. Minimally dilated small bowel loops are noted suggesting ileus. IMPRESSION: Nasogastric tube tip seen in expected position of proximal stomach. Electronically Signed   By: Lupita Raider M.D.   On: 09/22/2023 14:29   CT HEAD WO CONTRAST ( )  Result Date: 09/21/2023 CLINICAL DATA:  Initial evaluation for mental status change, unknown cause. EXAM: CT  HEAD WITHOUT CONTRAST TECHNIQUE: Contiguous axial images were obtained from the base of the skull through the vertex without intravenous contrast. RADIATION DOSE REDUCTION: This exam was performed according to the departmental dose-optimization program which includes automated exposure control, adjustment of the mA and/or kV according to patient size and/or use of iterative reconstruction technique. COMPARISON:  CT from 09/20/2023. FINDINGS: Brain: Examination technically limited by positioning. Cerebral volume within normal limits. Changes of chronic microvascular ischemic disease again noted. No acute intracranial hemorrhage. No acute large vessel territory infarct. No mass lesion or midline shift. No hydrocephalus or extra-axial fluid collection. Vascular: No abnormal hyperdense vessel. Skull: Scalp soft tissues and calvarium demonstrate no new finding. Sinuses/Orbits: Globes and orbital soft tissues demonstrate no acute finding. Remote posttraumatic defect noted at the right lamina papyracea. Changes of pan sinusitis, slightly improved from prior. Mastoid air cells remain clear.  Other: None. IMPRESSION: 1. No acute intracranial abnormality. 2. Changes of chronic microvascular ischemic disease. 3. Pan sinusitis, slightly improved from prior. Electronically Signed   By: Rise Mu M.D.   On: 09/21/2023 23:08     Family Communication: Daughter updated over phone. She understands and agrees.  Disposition: Status is: Inpatient Remains inpatient appropriate because: poor mental status, hepatic encephalopathy requiring ICU care.  Planned Discharge Destination: Home with Home Health    MDM level 3- His mental status poor, kidney function worsened. Ileus noted, MDF score high. Need close ICU monitoring for neurological, telemetry, electrolyte monitoring. He is at high risk for clinical deterioration.  Author: Marcelino Duster, MD 09/23/2023 12:39 PM Secure chat 7am to 7pm For on call review  [email or web address].

## 2023-09-23 NOTE — Consult Note (Signed)
Consultation Note Date: 09/23/2023   Patient Name: Shawn Torres  DOB: 1966/06/03  MRN: [id number]  Age / Sex: 57 y.o., male  PCP: Kurtis Bushman, MD Referring Physician: Marcelino Duster, MD  Reason for Consultation: Establishing goals of care   HPI/Brief Hospital Course: 57 y.o. 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  Noted recent admissions, 10/17-10/21 and 10/22-10/22 for hepatic encephalopathy, underwent EGD during recent admits and found to have esophagitis   Had paracentesis on 10/30-removal of 5.4L of fluid  Palliative medicine was consulted for assisting with goals of care conversations.  Subjective:  Extensive chart review has been completed prior to meeting patient including labs, vital signs, imaging, progress notes, orders, and available advanced directive documents from current and previous encounters.  Visited with Shawn Torres at his bedside. Resting in bed with eyes closed, does not acknowledge my presence in room. Daughter at bedside requests I return later when her sister arrives and it has been difficult in the past to have them both present. Daughter shares Shawn Torres is currently separated, she shares she does not feel they are legally divorced but she lives in [place] and does not have communication with the patient.  Daughter also shares she has a brother who is currently in prison.  Returned to bedside when both daughters present.  Interpreter present to assess Shawn Torres neurological status.  Introduced myself as a Publishing rights manager as a member of the palliative care team. Explained palliative medicine is specialized medical care for people living with serious illness. It focuses on providing relief from the  symptoms and stress of a serious illness. The goal is to improve quality of life for both the patient and the family.   Through the use of interpreter attempted to engage with Shawn Torres, after multiple reengagement's patient is able to open eyes but he is unable to communicate or follow any commands.  Without redirection he easily drifts back off to sleep.  Daughter's at bedside share to continue conversations they do not need the assistance of the interpreter.  Daughter Shawn Torres shares patient currently lives with her.  At baseline on some days he is able to independently care for himself but with frequent recurrent hospitalizations (3 in last month) he has primarily been bedbound.  She also shares over the last several weeks he has been unable to tolerate anything p.o.  Both daughter share they are well aware of the severity of their father's liver disease.  They share they were told by a medical provider over a year ago do not anticipate their father surviving more than a few weeks.  They share they relied on their faith and are thankful their father has had this time but can also speak to the significant decline they have noticed over the last several months.  Attempted to elicit goals of care.  We discussed the difference between full code versus DO NOT RESUSCITATE. Encouraged patient/family to consider DNR/DNI status understanding evidenced based poor outcomes in similar hospitalized patients, as the cause of the arrest is likely associated with chronic/terminal disease rather than a reversible acute cardio-pulmonary event.   We also discussed the difference between continuing with current plan of care versus shifting her focus to providing comfort care with an understanding their father's time would be limited to likely a few days and up to  1 week.  Shawn Torres shares over the last several weeks her father has shared with her how tired he is and that he was tired of having to go back and forth to  [facility] to have paracentesis.  Both daughter share they understand their father is suffering and share they do not feel as though this is the quality of life he would want for himself.  Daughter shares they will attempt to be in contact with her mother who resides in [place] to confirm marital status.  Daughters are aware if their mother is currently married to Shawn Torres she will be primary decision maker unless she states to the medical team she does not wish to be involved in decision making and it will been be the responsibility of the reasonably available adult children as Shawn Torres' parents are deceased.  Plan sent with daughters to revisit conversations tomorrow after they have had the opportunity to speak with her mother.  Will also need to communicate with their mother and offered to speak with her brother to also provide updates and involved in decision making as available.  I discussed importance of continued conversations with family/support persons and all members of their medical team regarding overall plan of care and treatment options ensuring decisions are in alignment with patients goals of care.  All questions/concerns addressed. Emotional support provided to patient/family/support persons. PMT will continue to follow and support patient as needed.  Objective: Primary Diagnoses: Present on Admission:  Acute hepatic encephalopathy (HCC)  Encephalopathy  Malnutrition of moderate degree  Alcoholic cirrhosis of liver with ascites (HCC)   Physical Exam Constitutional:      General: He is not in acute distress.    Appearance: He is cachectic. He is ill-appearing.     Comments: Temporal wasting  Pulmonary:     Effort: Pulmonary effort is normal. No respiratory distress.  Skin:    General: Skin is warm and dry.     Coloration: Skin is jaundiced.     Findings: Bruising present.  Neurological:     Mental Status: He is lethargic.     Motor: Weakness present.     Vital  Signs: BP 96/61   Pulse 77   Temp 98 F (36.7 C) (Axillary)   Resp 14   Ht 5\' 5"  (1.651 m)   Wt 67.5 kg   SpO2 100%   BMI 24.76 kg/m  Pain Scale: CPOT   Pain Score: 0-No pain  IO: Intake/output summary:  Intake/Output Summary (Last 24 hours) at 09/23/2023 1751 Last data filed at 09/23/2023 1625 Gross per 24 hour  Intake 2351.92 ml  Output 1725 ml  Net 626.92 ml    LBM: Last BM Date : 09/23/23 Baseline Weight: Weight: 74.4 kg Most recent weight: Weight: 67.5 kg       Assessment and Plan  SUMMARY OF RECOMMENDATIONS   Full Code Time for outcomes and establishing surrogate decision maker PMT to continue to follow for ongoing needs and support  Palliative Prophylaxis:   Bowel Regimen, Delirium Protocol and Frequent Pain Assessment   Discussed With: Nursing staff   Thank you for this consult and allowing Palliative Medicine to participate in the care of Shawn Torres. Palliative medicine will continue to follow and assist as needed.   Time Total: 75 minutes  Time spent includes: Detailed review of medical records (labs, imaging, vital signs), medically appropriate exam (mental status, respiratory, cardiac, skin), discussed with treatment team, counseling and educating patient, family and staff, documenting clinical information, medication  management and coordination of care.   Signed by: Leeanne Deed, DNP, AGNP-C Palliative Medicine    Please contact Palliative Medicine Team phone at [phone] for questions and concerns.  For individual provider: See Loretha Stapler

## 2023-09-24 LAB — GLUCOSE, CAPILLARY
Glucose-Capillary: 118 mg/dL — ABNORMAL HIGH (ref 70–99)
Glucose-Capillary: 120 mg/dL — ABNORMAL HIGH (ref 70–99)
Glucose-Capillary: 131 mg/dL — ABNORMAL HIGH (ref 70–99)
Glucose-Capillary: 137 mg/dL — ABNORMAL HIGH (ref 70–99)
Glucose-Capillary: 139 mg/dL — ABNORMAL HIGH (ref 70–99)
Glucose-Capillary: 144 mg/dL — ABNORMAL HIGH (ref 70–99)

## 2023-09-24 LAB — BASIC METABOLIC PANEL
Anion gap: 7 (ref 5–15)
BUN: 46 mg/dL — ABNORMAL HIGH (ref 6–20)
CO2: 17 mmol/L — ABNORMAL LOW (ref 22–32)
Calcium: 8 mg/dL — ABNORMAL LOW (ref 8.9–10.3)
Chloride: 116 mmol/L — ABNORMAL HIGH (ref 98–111)
Creatinine, Ser: 1.66 mg/dL — ABNORMAL HIGH (ref 0.61–1.24)
GFR, Estimated: 48 mL/min — ABNORMAL LOW (ref >60)
Glucose, Bld: 141 mg/dL — ABNORMAL HIGH (ref 70–99)
Potassium: 3.1 mmol/L — ABNORMAL LOW (ref 3.5–5.1)
Sodium: 140 mmol/L (ref 135–145)

## 2023-09-24 LAB — AMMONIA: Ammonia: 58 umol/L — ABNORMAL HIGH (ref 9–35)

## 2023-09-24 MED ORDER — ENSURE ENLIVE PO LIQD: 237.0000 mL | Freq: Three times a day (TID) | ORAL | Status: DC

## 2023-09-24 MED ORDER — ACETAMINOPHEN 650 MG RE SUPP: 650.0000 mg | Freq: Four times a day (QID) | RECTAL | Status: DC | PRN

## 2023-09-24 MED ORDER — ACETAMINOPHEN 325 MG PO TABS: 650.0000 mg | ORAL_TABLET | Freq: Four times a day (QID) | ORAL | Status: DC | PRN

## 2023-09-24 MED ORDER — HALOPERIDOL LACTATE 5 MG/ML IJ SOLN
1.0000 mg | Freq: Once | INTRAMUSCULAR | Status: AC
  Administered 2023-09-24: 1 mg via INTRAVENOUS
  Filled 2023-09-24: qty 1

## 2023-09-24 MED ORDER — ONDANSETRON HCL 4 MG/2ML IJ SOLN
4.0000 mg | Freq: Four times a day (QID) | INTRAMUSCULAR | Status: DC | PRN
  Administered 2023-09-26: 4 mg via INTRAVENOUS
  Filled 2023-09-24: qty 2

## 2023-09-24 MED ORDER — POTASSIUM CHLORIDE 10 MEQ/100ML IV SOLN
10.0000 meq | INTRAVENOUS | Status: AC
  Administered 2023-09-24 (×4): 10 meq via INTRAVENOUS
  Filled 2023-09-24 (×4): qty 100

## 2023-09-24 MED ORDER — GUAIFENESIN 100 MG/5ML PO LIQD: 5.0000 mL | ORAL | Status: DC | PRN

## 2023-09-24 MED ORDER — KCL IN DEXTROSE-NACL 20-5-0.9 MEQ/L-%-% IV SOLN
INTRAVENOUS | Status: AC
  Filled 2023-09-24 (×2): qty 1000

## 2023-09-24 MED ORDER — ONDANSETRON HCL 4 MG PO TABS: 4.0000 mg | ORAL_TABLET | Freq: Four times a day (QID) | ORAL | Status: DC | PRN

## 2023-09-24 NOTE — Progress Notes (Signed)
Progress Note   Patient: Shawn Torres ZHY:[id number] DOB: [birth year] DOA: 09/20/2023     4 DOS: the patient was seen and examined on 09/24/2023   Brief hospital course: Kacee Koren is a 57 y.o. male with medical history significant of alcoholic liver cirrhosis, with recurrent hepatic encephalopathy and ascites, recent upper GI bleed secondary to portal vein hypertension, sent by manage member for evaluation of confusion, worsening of nauseous vomiting.  Patient is admitted to the hospitalist service for further management evaluation of hepatic encephalopathy, intractable nausea, vomiting, ascites due to cirhosis, possible SBP.  10/31 - he was more lethargic at night, transferred to ICU for close monitoring. CT head unremarkable. Ammonia level high got Lactulose PR.  11/1: Sleepy but arousable. NG tube inserted for feeds and meds. 11/2- awake, does not talk. Rectal tube with loose stool. Daughters spoke to palliative. 11/3: more awake, coughing with sips of water. Has NG to low wall suction.  Assessment and Plan: Acute hepatic encephalopathy  Patient more awake, able to respond better.  Continue lactulose via NG, titrate to 2-3 bowel movements. Neurochecks every 2 hours x 1 day until ammonia level trending down. S/p IR paracentesis with 5.4 L fluid removal 09/20/23.  On prophylactic ceftriaxone pending fluid cultures. Continue to hold tube feeding NG to LWS.  Transfer to progressive care unit.   Acute on chronic ascites S/p IR paracentesis with 5.4 L fluid removal. Held Lasix, spironolactone given AKI Albumin infusions q8hr for 1 day given.  Acute on chronic coagulopathy S/p Vitamin K x 2. no active bleeding at this time. Continue to monitor INR. Continue rifaximin  Acute kidney injury  Dehydration, Hepatorenal causes. Will continue gentle IV hydration. Continue to monitor daily renal function. Avoid nephrotoxic drugs.   Intractable nausea vomiting Ileus Continue  antiemetics.  NG to LWS, hold feeds for now. SLP eval as he is more awake. Continue IV hydration.  Chronic anemia: No active bleeding. H&H stable. Reviewed patient history showed patient had recent coffee-ground vomiting secondary to grade 1 esophageal varices on EGD.   Nutrition Documentation    Flowsheet Row ED to Hosp-Admission (Current) from 09/20/2023 in [facility] ICU/CCU  Nutrition Problem Moderate Malnutrition  Etiology chronic illness  [alcoholic cirrhosis]  Nutrition Goal Patient will meet greater than or equal to 90% of their needs  Interventions Tube feeding     Out of bed to chair. Incentive spirometry. Nursing supportive care. Fall, aspiration precautions. DVT prophylaxis   Code Status: Full Code He had multiple admissions, MDF score 65, overall prognosis poor. Palliative care discussed with daughter regarding goals of care discussion.  Subjective: Patient is seen and examined today morning. He is awake, responding better. NG cannister with dark fluid. No family at bedside.  Physical Exam: Vitals:   09/24/23 0600 09/24/23 0700 09/24/23 0753 09/24/23 0800  BP: 118/72 113/72  109/67  Pulse: 66 61  (!) 57  Resp: 15 15  16   Temp:   (!) 96.9 F (36.1 C)   TempSrc:   Axillary   SpO2: 100% 100%  100%  Weight:      Height:        General - Middle aged Hispanic male, no apparent distress HEENT - PERRLA, EOMI, atraumatic head, non tender sinuses. Lung - Clear, basal rales, rhonchi. Heart - S1, S2 heard, no murmurs, rubs, trace pedal edema. Abdomen - Soft, non tender, distended, bowel sounds good Neuro - alert, able to respond, moving all extremities Skin - Warm and dry.  Data  Reviewed:      Latest Ref Rng & Units 09/23/2023    4:22 AM 09/22/2023    9:51 PM 09/22/2023    4:51 AM  CBC  WBC 4.0 - 10.5 K/uL 11.0  9.7  8.4   Hemoglobin 13.0 - 17.0 g/dL 9.4  9.9  9.2   Hematocrit 39.0 - 52.0 % 27.4  28.5  25.8   Platelets 150 - 400 K/uL  132  125  128       Latest Ref Rng & Units 09/24/2023    7:46 AM 09/23/2023    4:22 AM 09/22/2023    4:51 AM  BMP  Glucose 70 - 99 mg/dL 166  063  016   BUN 6 - 20 mg/dL 46  50  36   Creatinine 0.61 - 1.24 mg/dL 0.10  9.32  3.55   Sodium 135 - 145 mmol/L 140  138  135   Potassium 3.5 - 5.1 mmol/L 3.1  3.0  3.9   Chloride 98 - 111 mmol/L 116  109  105   CO2 22 - 32 mmol/L 17  16  18    Calcium 8.9 - 10.3 mg/dL 8.0  8.0  7.6    DG Abd 1 View  Result Date: 09/23/2023 CLINICAL DATA:  [id number] Encounter for nasogastric (NG) tube placement [id number] EXAM: ABDOMEN - 1 VIEW COMPARISON:  09/22/2023 FINDINGS: Limited radiograph of the lower chest and upper abdomen was obtained for the purposes of enteric tube localization. Enteric tube is seen coursing below the diaphragm with distal tip and side port terminating within the expected location of the gastric body. Gaseous distension of bowel within the imaged upper abdomen, similar to prior. IMPRESSION: Enteric tube terminates within the expected location of the gastric body. Electronically Signed   By: Duanne Guess D.O.   On: 09/23/2023 13:22   DG Abd 1 View  Result Date: 09/22/2023 CLINICAL DATA:  Vomiting EXAM: ABDOMEN - 1 VIEW COMPARISON:  09/22/2023 FINDINGS: Gas-filled nondistended colon with scattered gas in nondistended small bowel. Bowel gas is decreased since prior study. Changes are likely due to ileus. Enteric tube is again demonstrated with tip in the right upper quadrant consistent with location in the distal stomach. Calcification in the right upper quadrant consistent with gallstones. Vascular calcifications. Acute fractures are suggested in the anterior right ninth and tenth ribs. Lung bases are clear. Degenerative changes in the spine. IMPRESSION: 1. Gas-filled nondistended colon with scattered gas in the small bowel, decreased since prior study. Changes are likely due to ileus. 2. Enteric tube tip projects over the right upper quadrant  consistent with location in the distal stomach. 3. Acute appearing fractures of right anterior ninth and tenth ribs. Electronically Signed   By: Burman Nieves M.D.   On: 09/22/2023 22:33     Family Communication: Daughter updated over phone. She understands and agrees.  Disposition: Status is: Inpatient Remains inpatient appropriate because: poor mental status, hepatic encephalopathy requiring ICU care.  Planned Discharge Destination: Home with Home Health    MDM level 3- His mental status, kidney function and overall prognosis poor. NG to suction, has dysphagia. MDF score high. Need close monitoring for neurological, telemetry, electrolyte monitoring. He is at high risk for clinical deterioration.  Author: Marcelino Duster, MD 09/24/2023 10:50 AM Secure chat 7am to 7pm For on call review [email or web address].

## 2023-09-24 NOTE — Progress Notes (Signed)
Daily Progress Note   Patient Name: Shawn Torres       Date: 09/24/2023 DOB: [birth year]  Age: 57 y.o. MRN#: [id number] Attending Physician: Marcelino Duster, MD Primary Care Physician: Kurtis Bushman, MD Admit Date: 09/20/2023  Reason for Consultation/Follow-up: Establishing goals of care  HPI/Brief Hospital Review: 57 y.o. 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   Noted recent admissions, 10/17-10/21 and 10/22-10/22 for hepatic encephalopathy, underwent EGD during recent admits and found to have esophagitis    Had paracentesis on 10/30-removal of 5.4L of fluid   Palliative medicine was consulted for assisting with goals of care conversations.  Subjective: Extensive chart review has been completed prior to meeting patient including labs, vital signs, imaging, progress notes, orders, and available advanced directive documents from current and previous encounters.    Visited with patient at his bedside he is much more awake, alert and has the ability to engage in conversations today.  Spanish-speaking interpreter present as well as patient's 2 daughters during time of visit.  Daughters and patient confirm he is not currently legally married making his children surrogate Management consultant as appropriate.  His son was made available by speaker phone.  Attempted to assess patient's cognitive status.  When asked simple orientation questions he is able to correctly answer that he is in a hospital setting but he is unable to answer month, date, year as he explains he is unable to read or  write.  Attempted to explain current medical condition to patient related to his liver cirrhosis and hepatic encephalopathy.  Attempted to explain the chronic disease trajectory related to his underlying illnesses and the concern related to the progression of his disease with most recent 3 hospitalizations within the last month.  During explanation patient consistently shares his goal is to return to [place] to his family.  It remains unclear if he is able to understand the severity of his disease process.  Discussed CODE STATUS and the difference between full code and DO NOT RESUSCITATE. Encouraged patient to consider DNR/DNI status understanding evidenced based poor outcomes in similar hospitalized patients, as the cause of the arrest is likely associated with chronic/terminal disease  rather than a reversible acute cardio-pulmonary event.  At this time patient wishes to remain full code.  Family again shares their concerns related to repetitive cycle of having to return to the hospital after only a few days spent at home and the overall prognosis but are struggling and wanting to honor their father's wishes.  Answered and addressed all questions and concerns.  PMT to continue to follow for ongoing needs and support.   Palliative Care Assessment & Plan   Assessment/Recommendation/Plan  Ongoing GOC needed PMT to continue to follow for ongoing needs and support  Care plan was discussed with nursing staff.  Thank you for allowing the Palliative Medicine Team to assist in the care of this patient.  Total time:  50 minutes  Time spent includes: Detailed review of medical records (labs, imaging, vital signs), medically appropriate exam (mental status, respiratory, cardiac, skin), discussed with treatment team, counseling and educating patient, family and staff, documenting clinical information, medication management and coordination of care.  Leeanne Deed, DNP, AGNP-C Palliative  Medicine   Please contact Palliative Medicine Team phone at [phone] for questions and concerns.

## 2023-09-25 ENCOUNTER — Inpatient Hospital Stay: Payer: Self-pay

## 2023-09-25 LAB — GLUCOSE, CAPILLARY
Glucose-Capillary: 134 mg/dL — ABNORMAL HIGH (ref 70–99)
Glucose-Capillary: 124 mg/dL — ABNORMAL HIGH (ref 70–99)
Glucose-Capillary: 129 mg/dL — ABNORMAL HIGH (ref 70–99)
Glucose-Capillary: 181 mg/dL — ABNORMAL HIGH (ref 70–99)
Glucose-Capillary: 95 mg/dL (ref 70–99)

## 2023-09-25 LAB — BASIC METABOLIC PANEL
Anion gap: 5 (ref 5–15)
BUN: 31 mg/dL — ABNORMAL HIGH (ref 6–20)
CO2: 19 mmol/L — ABNORMAL LOW (ref 22–32)
Calcium: 8.1 mg/dL — ABNORMAL LOW (ref 8.9–10.3)
Chloride: 115 mmol/L — ABNORMAL HIGH (ref 98–111)
Creatinine, Ser: 1.21 mg/dL (ref 0.61–1.24)
GFR, Estimated: >60 mL/min (ref >60)
Glucose, Bld: 131 mg/dL — ABNORMAL HIGH (ref 70–99)
Potassium: 3.9 mmol/L (ref 3.5–5.1)
Sodium: 139 mmol/L (ref 135–145)

## 2023-09-25 LAB — CULTURE, BODY FLUID W GRAM STAIN -BOTTLE: Culture: NO GROWTH

## 2023-09-25 LAB — CBC
HCT: 24.2 % — ABNORMAL LOW (ref 39.0–52.0)
Hemoglobin: 8.8 g/dL — ABNORMAL LOW (ref 13.0–17.0)
MCH: 35.9 pg — ABNORMAL HIGH (ref 26.0–34.0)
MCHC: 36.4 g/dL — ABNORMAL HIGH (ref 30.0–36.0)
MCV: 98.8 fL (ref 80.0–100.0)
Platelets: 108 10*3/uL — ABNORMAL LOW (ref 150–400)
RBC: 2.45 MIL/uL — ABNORMAL LOW (ref 4.22–5.81)
RDW: 14.6 % (ref 11.5–15.5)
WBC: 9.3 10*3/uL (ref 4.0–10.5)
nRBC: 0 % (ref 0.0–0.2)

## 2023-09-25 LAB — PROTIME-INR
INR: 2.8 — ABNORMAL HIGH (ref 0.8–1.2)
Prothrombin Time: 29.4 s — ABNORMAL HIGH (ref 11.4–15.2)

## 2023-09-25 MED ORDER — NEPRO/CARBSTEADY PO LIQD
237.0000 mL | Freq: Three times a day (TID) | ORAL | Status: DC
  Administered 2023-09-25 – 2023-09-28 (×5): 237 mL via ORAL

## 2023-09-25 MED ORDER — ADULT MULTIVITAMIN W/MINERALS CH
1.0000 | ORAL_TABLET | Freq: Every day | ORAL | Status: DC
  Administered 2023-09-25: 1 via ORAL
  Filled 2023-09-25: qty 1

## 2023-09-25 MED ORDER — ADULT MULTIVITAMIN W/MINERALS CH
1.0000 | ORAL_TABLET | Freq: Every day | ORAL | Status: DC
  Administered 2023-09-26: 1
  Filled 2023-09-25: qty 1

## 2023-09-25 MED ORDER — NEPRO/CARBSTEADY PO LIQD: 237.0000 mL | Freq: Two times a day (BID) | ORAL | Status: DC

## 2023-09-25 NOTE — Plan of Care (Signed)

## 2023-09-25 NOTE — Progress Notes (Addendum)
Nutrition Follow-up  DOCUMENTATION CODES:   Non-severe (moderate) malnutrition in context of chronic illness  INTERVENTION:   -D/c Osmolite 1.5 -D/c Prosource TF -Nepro Shake po TID, each supplement provides 425 kcal and 19 grams protein  -Magic cup TID with meals, each supplement provides 290 kcal and 9 grams of protein  -MVI with minerals daily  NUTRITION DIAGNOSIS:   Moderate Malnutrition related to chronic illness (alcoholic cirrhosis) as evidenced by mild fat depletion, moderate fat depletion, moderate muscle depletion, mild muscle depletion.  Ongoing  GOAL:   Patient will meet greater than or equal to 90% of their needs  Progressing   MONITOR:   PO intake, Supplement acceptance, Diet advancement  REASON FOR ASSESSMENT:   Consult Assessment of nutrition requirement/status, Enteral/tube feeding initiation and management  ASSESSMENT:   Pt with medical history significant of alcoholic liver cirrhosis, with recurrent hepatic encephalopathy and ascites, recent upper GI bleed secondary to portal vein hypertension, presents for evaluation of confusion, worsening of nauseous vomiting.  10/30- s/p rt paracentesis (5.4 L removed) 10/31- rapid response called due to decreased mentation 11/1- NGT placed (tip of tube confirmed in stomach per KUB), rectal tube placed 11/2- NGT removed and replaced 11/3- NGT d/c  Reviewed I/O's: -441 ml x 24 hours and +20 ml since admission  UOP: 460 ml x 24 hours  NGT output: 1.6 L x 24 hours  Rectal tube output: 300 ml x 24 hours  TF held on 09/23/23 secondary to vomiting.   Case discussed with RN, who reports TF were held secondary to vomiting. She shares pt pulled NGT out yesterday, which was replaced, however, tube clogged this morning. RN shares pt is unable to flush the tube and thinks that medication residue is clogging the tube. Plan to pull NGT and obtain SLP consult today.   Per RN, pt is alert to self only. Pt more  interactive and alert in comparison to last RD visit. Pt able to open eyes and smile at this RD when name was called. Pt endorses feeling better.   Case discussed with SLP; pt advanced to a dysphagia 3 diet with nectar thick liquids. Pt would benefit from supplements due to malnutrition and restricted diet.   Palliaitve care following for goals of care.   Medications reviewed and include lactulose, dextrose 5% and 0.5% NaCl with KCl 20 mEq/L infusion @ 75 ml/hr, and thiamine.   Labs reviewed: CBGS: 129-144 (inpatient orders for glycemic control are none).    Diet Order:   Diet Order             DIET DYS 3 Room service appropriate? Yes with Assist; Fluid consistency: Nectar Thick  Diet effective now                   EDUCATION NEEDS:   Not appropriate for education at this time  Skin:  Skin Assessment: Reviewed RN Assessment  Last BM:  09/24/23 (300 ml via rectal tube)  Height:   Ht Readings from Last 1 Encounters:  09/20/23 5\' 5"  (1.651 m)    Weight:   Wt Readings from Last 1 Encounters:  09/25/23 70 kg    Ideal Body Weight:  61.8 kg  BMI:  Body mass index is 25.68 kg/m.  Estimated Nutritional Needs:   Kcal:  1950-2150  Protein:  105-120 grams  Fluid:  > 1.9 L    Levada Schilling, RD, LDN, CDCES Registered Dietitian III Certified Diabetes Care and Education Specialist Please refer to [facility] for RD  and/or RD on-call/weekend/after hours pager

## 2023-09-25 NOTE — Progress Notes (Signed)
Progress Note   Patient: Shawn Torres DOB: [birth year] DOA: 09/20/2023     5 DOS: the patient was seen and examined on 09/25/2023   Brief hospital course: Shawn Torres is a 57 y.o. male with medical history significant of alcoholic liver cirrhosis, with recurrent hepatic encephalopathy and ascites, recent upper GI bleed secondary to portal vein hypertension, sent by manage member for evaluation of confusion, worsening of nauseous vomiting.  Patient is admitted to the hospitalist service for further management evaluation of hepatic encephalopathy, intractable nausea, vomiting, ascites due to cirhosis, possible SBP.  10/31 - he was more lethargic at night, transferred to ICU for close monitoring. CT head unremarkable. Ammonia level high got Lactulose PR.  11/1: Sleepy but arousable. NG tube inserted for feeds and meds. 11/2- awake, does not talk. Rectal tube with loose stool. Daughters spoke to palliative. 11/3: more awake, coughing with sips of water. Has NG to low wall suction. 11/4: He is more alert, awake. NG removed, SLP evaluated, started diet.   Assessment and Plan: Acute hepatic encephalopathy  Patient more awake, able to respond better.  Continue lactulose oral, titrate to 2-3 bowel movements. NG removed. Neurochecks per floor protocol. S/p IR paracentesis with 5.4 L fluid removal 09/20/23.  stop prophylactic ceftriaxone as fluid cultures negative.   Acute on chronic ascites S/p IR paracentesis with 5.4 L fluid removal. Held Lasix, spironolactone given AKI S/p Albumin 1 day.  Acute on chronic coagulopathy S/p Vitamin K x 2. no active bleeding at this time. Continue to monitor INR. INR 2.8. No active bleeding. Continue rifaximin  Acute kidney injury  Dehydration, Hepatorenal causes. Improved with gentle IV hydration. Continue to monitor daily renal function. Avoid nephrotoxic drugs.   Intractable nausea vomiting Ileus Continue antiemetics.  SLP  evaluated, dys 3, nectar thick liquids ordered. Taper gentle IV hydration.  Chronic anemia: No active bleeding. H&H stable. Reviewed patient history showed patient had recent coffee-ground vomiting secondary to grade 1 esophageal varices on EGD.   Nutrition Documentation    Flowsheet Row ED to Hosp-Admission (Current) from 09/20/2023 in [facility] ICU/CCU  Nutrition Problem Moderate Malnutrition  Etiology chronic illness  [alcoholic cirrhosis]  Nutrition Goal Patient will meet greater than or equal to 90% of their needs  Interventions Tube feeding     Out of bed to chair. Incentive spirometry. Nursing supportive care. Fall, aspiration precautions. DVT prophylaxis   Code Status: Full Code He had multiple admissions, MDF score 65, overall prognosis poor. Palliative care on going discussion with daughter regarding goals of care discussion. Patient wished for FULL CODE.  Subjective: Patient is seen and examined today morning. He is awake, responding better. NG tube low output. Passed dys 3 diet. No overnight issues.  Physical Exam: Vitals:   09/25/23 0310 09/25/23 0443 09/25/23 0745 09/25/23 1220  BP: 116/75  106/68 113/72  Pulse: 68  73 77  Resp: 19  18 18   Temp: 97.9 F (36.6 C)  98.8 F (37.1 C) 98.6 F (37 C)  TempSrc:   Oral Oral  SpO2: 100%  100% 100%  Weight:  70 kg    Height:        General - Middle aged Hispanic male, no apparent distress HEENT - PERRLA, EOMI, atraumatic head, non tender sinuses. Lung - Clear, basal rales, rhonchi. Heart - S1, S2 heard, no murmurs, rubs, trace pedal edema. Abdomen - Soft, non tender, distended, bowel sounds good Neuro - alert, able to respond, moving all extremities Skin - Warm  and dry.  Data Reviewed:      Latest Ref Rng & Units 09/25/2023    4:50 AM 09/23/2023    4:22 AM 09/22/2023    9:51 PM  CBC  WBC 4.0 - 10.5 K/uL 9.3  11.0  9.7   Hemoglobin 13.0 - 17.0 g/dL 8.8  9.4  9.9   Hematocrit 39.0 -  52.0 % 24.2  27.4  28.5   Platelets 150 - 400 K/uL 108  132  125       Latest Ref Rng & Units 09/25/2023    4:50 AM 09/24/2023    7:46 AM 09/23/2023    4:22 AM  BMP  Glucose 70 - 99 mg/dL 829  562  130   BUN 6 - 20 mg/dL 31  46  50   Creatinine 0.61 - 1.24 mg/dL 8.65  7.84  6.96   Sodium 135 - 145 mmol/L 139  140  138   Potassium 3.5 - 5.1 mmol/L 3.9  3.1  3.0   Chloride 98 - 111 mmol/L 115  116  109   CO2 22 - 32 mmol/L 19  17  16    Calcium 8.9 - 10.3 mg/dL 8.1  8.0  8.0    DG Abd 1 View  Result Date: 09/25/2023 CLINICAL DATA:  NG tube placement EXAM: ABDOMEN - 1 VIEW COMPARISON:  09/23/2023 FINDINGS: NG tube tip is in the proximal stomach with the side port near the GE junction. IMPRESSION: NG tube tip in the stomach. Electronically Signed   By: Charlett Nose M.D.   On: 09/25/2023 00:31     Family Communication: Daughter updated over phone. She understands and agrees.  Disposition: Status is: Inpatient Remains inpatient appropriate because: labile mental status, hepatic encephalopathy, electrolyte abnormalities.  Planned Discharge Destination: Home with Home Health    Time spent 39 min.  Author: Marcelino Duster, MD 09/25/2023 3:55 PM Secure chat 7am to 7pm For on call review [email or web address].

## 2023-09-25 NOTE — Evaluation (Signed)
Clinical/Bedside Swallow Evaluation Patient Details  Name: Shawn Torres MRN: [id number] Date of Birth: [birth year]  Today's Date: 09/25/2023 Time: SLP Start Time (ACUTE ONLY): 0940 SLP Stop Time (ACUTE ONLY): 1040 SLP Time Calculation (min) (ACUTE ONLY): 60 min  Past Medical History:  Past Medical History:  Diagnosis Date   Ascites    Cirrhosis, alcoholic (HCC)    Diabetes mellitus without complication (HCC)    Memory loss    Past Surgical History:  Past Surgical History:  Procedure Laterality Date   ESOPHAGOGASTRODUODENOSCOPY N/A 10/10/2021   Procedure: ESOPHAGOGASTRODUODENOSCOPY (EGD);  Surgeon: Midge Minium, MD;  Location: [facility] ENDOSCOPY;  Service: Endoscopy;  Laterality: N/A;   ESOPHAGOGASTRODUODENOSCOPY N/A 12/27/2021   Procedure: ESOPHAGOGASTRODUODENOSCOPY (EGD);  Surgeon: [name], DO;  Location: [facility] ENDOSCOPY;  Service: Gastroenterology;  Laterality: N/A;  Spanish Interpreter   ESOPHAGOGASTRODUODENOSCOPY N/A 01/20/2022   Procedure: ESOPHAGOGASTRODUODENOSCOPY (EGD);  Surgeon: [name], DO;  Location: [facility] ENDOSCOPY;  Service: Gastroenterology;  Laterality: N/A;  Spanish Interpreter   ESOPHAGOGASTRODUODENOSCOPY N/A 02/03/2022   Procedure: ESOPHAGOGASTRODUODENOSCOPY (EGD);  Surgeon: [name], DO;  Location: [facility] ENDOSCOPY;  Service: Gastroenterology;  Laterality: N/A;  Needs Spanish Interpreter - (prefers) all calls to daughter (she speaks [place])   ESOPHAGOGASTRODUODENOSCOPY (EGD) WITH PROPOFOL N/A 12/11/2021   Procedure: ESOPHAGOGASTRODUODENOSCOPY (EGD) WITH PROPOFOL;  Surgeon: [name], DO;  Location: [facility] ENDOSCOPY;  Service: Gastroenterology;  Laterality: N/A;   ESOPHAGOGASTRODUODENOSCOPY (EGD) WITH PROPOFOL N/A 09/10/2023   Procedure: ESOPHAGOGASTRODUODENOSCOPY (EGD) WITH PROPOFOL;  Surgeon: Toledo, Boykin Nearing, MD;  Location: [facility] ENDOSCOPY;  Service: Gastroenterology;  Laterality: N/A;   IR PARACENTESIS  02/15/2022   HPI:   Pt is a 56 y.o. male with medical history significant of Memory loss, Alcoholic liver cirrhosis(follpws at [facility] for the cirrhosis per chart), with recurrent hepatic encephalopathy and ascites, recent upper GI bleed secondary to portal vein hypertension, sent for evaluation of confusion, worsening of nauseous vomiting.  Patient is confused unable to provide any history, or history provided by daughter over the phone.  Daughter reported that patient was recently hospitalized and discharged about 1 week ago.  Initially patient was compliant with all his cirrhosis medications including lactulose and diuresis however starting about 3 to 4 days ago patient started to feel nausea with frequent vomitings, including meals and pills as well as lactulose.  Daughter did not see any coffee-ground stuff but only undigested food and pills.  But the patient had no complaints about abdominal pain for the last few days.  And yesterday evening, patient started to become confused and started calling names of his brothers and sisters in the rooms who however about the [place].   CXR: No active disease at admit.    Assessment / Plan / Recommendation  Clinical Impression   Pt seen for BSE this morning w/ support of Spanish [place] Newcomer [phone]. Palliative Care NP following pt also.  SLP Visit Diagnosis: Dysphagia, pharyngeal phase (R13.13);Dysphagia, pharyngoesophageal phase (R13.14) (ETOH abuse/use; memory loss)    Aspiration Risk  Mild aspiration risk;Moderate aspiration risk;Risk for inadequate nutrition/hydration    Diet Recommendation   Nectar;Dysphagia 3 (mechanical soft)  Medication Administration: Crushed with puree (vs Whole in puree)    Other  Recommendations Recommended Consults:  (Dietician f/u) Oral Care Recommendations: Oral care BID;Oral care before and after PO;Staff/trained caregiver to provide oral care (support) Caregiver Recommendations: Avoid jello, ice cream, thin soups, popsicles;Remove water  pitcher;Have oral suction available    Recommendations for follow up therapy are one component of a multi-disciplinary  discharge planning process, led by the attending physician.  Recommendations may be updated based on patient status, additional functional criteria and insurance authorization.  Follow up Recommendations Follow physician's recommendations for discharge plan and follow up therapies      Assistance Recommended at Discharge  full  Functional Status Assessment Patient has had a recent decline in their functional status and/or demonstrates limited ability to make significant improvements in function in a reasonable and predictable amount of time  Frequency and Duration min 2x/week  2 weeks       Prognosis Prognosis for improved oropharyngeal function: Fair Barriers to Reach Goals: Cognitive deficits;Time post onset;Severity of deficits;Behavior Barriers/Prognosis Comment: ETOH abuse/use; Cognitive decline      Swallow Study   General Date of Onset: 09/20/23 HPI: Pt is a 57 y.o. male with medical history significant of Memory loss, Alcoholic liver cirrhosis(follpws at [facility] for the cirrhosis per chart), with recurrent hepatic encephalopathy and ascites, recent upper GI bleed secondary to portal vein hypertension, sent for evaluation of confusion, worsening of nauseous vomiting.  Patient is confused unable to provide any history, or history provided by daughter over the phone.  Daughter reported that patient was recently hospitalized and discharged about 1 week ago.  Initially patient was compliant with all his cirrhosis medications including lactulose and diuresis however starting about 3 to 4 days ago patient started to feel nausea with frequent vomitings, including meals and pills as well as lactulose.  Daughter did not see any coffee-ground stuff but only undigested food and pills.  But the patient had no complaints about abdominal pain for the last few days.  And yesterday evening,  patient started to become confused and started calling names of his brothers and sisters in the rooms who however about the [place].   CXR: No active disease at admit. Type of Study: Bedside Swallow Evaluation Previous Swallow Assessment: 09/2021 - regular diet w/ thins then Diet Prior to this Study: NPO Temperature Spikes Noted: No (wbc 9.3) Respiratory Status: Room air History of Recent Intubation: No Behavior/Cognition: Alert;Cooperative;Pleasant mood;Confused;Requires cueing;Distractible (baseline memory loss per chart) Oral Cavity Assessment: Dry Oral Care Completed by SLP: Yes Oral Cavity - Dentition: Adequate natural dentition (few missing) Vision: Functional for self-feeding Self-Feeding Abilities: Able to feed self;Needs assist;Needs set up (Supervised) Patient Positioning: Upright in bed (needed full support - weak) Baseline Vocal Quality: Normal Volitional Cough: Strong Volitional Swallow: Able to elicit    Oral/Motor/Sensory Function Overall Oral Motor/Sensory Function: Within functional limits   Ice Chips Ice chips: Within functional limits Presentation: Spoon (fed; 5 trials)   Thin Liquid Thin Liquid: Impaired Presentation: Cup;Self Fed (x2 trials) Oral Phase Impairments: Poor awareness of bolus (min) Oral Phase Functional Implications: Prolonged oral transit Pharyngeal  Phase Impairments: Throat Clearing - Immediate;Cough - Immediate (x2/2)    Nectar Thick Nectar Thick Liquid: Within functional limits Presentation: Cup;Self Fed (4 ozs)   Honey Thick Honey Thick Liquid: Not tested   Puree Puree: Within functional limits Presentation: Spoon (fed; 4 ozs)   Solid     Solid: Within functional limits (grossly) Presentation: Spoon;Self Fed (fed; 10 trials) Other Comments: moistened well        Jerilynn Som, MS, CCC-SLP Speech Language Pathologist Rehab Services; [facility] [phone] (ascom) Endya Austin 09/25/2023,4:08 PM

## 2023-09-25 NOTE — Progress Notes (Signed)
Palliative Care Progress Note, Assessment & Plan   Patient Name: Shawn Torres       Date: 09/25/2023 DOB: 28-Mar-1966  Age: 57 y.o. MRN#: [id number] Attending Physician: Marcelino Duster, MD Primary Care Physician: Kurtis Bushman, MD Admit Date: 09/20/2023  Subjective: Patient is sitting up in bed with interpreter via iPad and SLP therapist at bedside.  He acknowledges my presence and is able to make his wishes known.  No family or friends present during my visit.  HPI: Shawn Torres is a 57 y.o. male with medical history significant of alcoholic liver cirrhosis, with recurrent hepatic encephalopathy and ascites, recent upper GI bleed secondary to portal vein hypertension, sent by manage member for evaluation of confusion, worsening of nauseous vomiting.  Patient is admitted to the hospitalist service for further management evaluation of hepatic encephalopathy, intractable nausea, vomiting, ascites due to cirhosis, possible SBP.   10/31 - he was more lethargic at night, transferred to ICU for close monitoring. CT head unremarkable. Ammonia level high got Lactulose PR.  11/1: Sleepy but arousable. NG tube inserted for feeds and meds. 11/2- awake, does not talk. Rectal tube with loose stool. Daughters spoke to palliative. 11/3: more awake, coughing with sips of water. Has NG to low wall suction. 11/4: He is more alert, awake. NG removed, SLP evaluated, started diet.   Summary of counseling/coordination of care: Extensive chart review completed prior to meeting patient including labs, vital signs, imaging, progress notes, orders, and available advanced directive documents from current and previous encounters.   After reviewing the patient's chart and assessing the patient at bedside, I spoke  with patient and SLP in regards to symptoms and plan of care.  Patient is able to tolerate sips of thickened liquids without difficulty.  See SLP note for full detail.  I attempted to elicit values and goals important to the patient.  He shares he is doing okay and does not have any issues or concerns at this time.  I highlighted that patient had discussed boundaries of care such as CODE STATUS and ventilatory support in the past with my colleagues.  He nodded his head and shares he remembers.  However, he had no further comments in regards to boundaries of medical care at this time.  PMT will continue to follow and support patient does hospitalization.  Conversations in regards to boundaries and goals of care will continue.  Full code and full scope remain.  Physical Exam Constitutional:      General: He is not in acute distress.    Appearance: He is normal weight.  HENT:     Head: Normocephalic.     Mouth/Throat:     Mouth: Mucous membranes are moist.  Eyes:     Pupils: Pupils are equal, round, and reactive to light.  Abdominal:     Palpations: Abdomen is soft.  Skin:    General: Skin is warm and dry.     Coloration: Skin is pale.  Neurological:     Mental Status: He is alert.  Psychiatric:        Mood and Affect: Mood normal.        Behavior: Behavior normal.  Judgment: Judgment normal.             Total Time 25 minutes   Time spent includes: Detailed review of medical records (labs, imaging, vital signs), medically appropriate exam (mental status, respiratory, cardiac, skin), discussed with treatment team, counseling and educating patient, family and staff, documenting clinical information, medication management and coordination of care.  Shawn Torres L. Bonita Quin, DNP, FNP-BC Palliative Medicine Team

## 2023-09-26 ENCOUNTER — Inpatient Hospital Stay: Payer: Self-pay

## 2023-09-26 LAB — GLUCOSE, CAPILLARY
Glucose-Capillary: 134 mg/dL — ABNORMAL HIGH (ref 70–99)
Glucose-Capillary: 106 mg/dL — ABNORMAL HIGH (ref 70–99)
Glucose-Capillary: 111 mg/dL — ABNORMAL HIGH (ref 70–99)
Glucose-Capillary: 124 mg/dL — ABNORMAL HIGH (ref 70–99)
Glucose-Capillary: 112 mg/dL — ABNORMAL HIGH (ref 70–99)

## 2023-09-26 MED ORDER — ONDANSETRON 4 MG PO TBDP
4.0000 mg | ORAL_TABLET | Freq: Three times a day (TID) | ORAL | Status: DC | PRN
  Administered 2023-09-26: 4 mg via ORAL
  Filled 2023-09-26 (×2): qty 1

## 2023-09-26 MED ORDER — FUROSEMIDE 20 MG PO TABS
20.0000 mg | ORAL_TABLET | Freq: Every day | ORAL | Status: DC
  Administered 2023-09-26 – 2023-09-28 (×2): 20 mg via ORAL
  Filled 2023-09-26 (×2): qty 1

## 2023-09-26 MED ORDER — ONDANSETRON HCL 4 MG PO TABS: 4.0000 mg | ORAL_TABLET | Freq: Four times a day (QID) | ORAL | Status: DC | PRN

## 2023-09-26 MED ORDER — CARVEDILOL 3.125 MG PO TABS
3.1250 mg | ORAL_TABLET | Freq: Two times a day (BID) | ORAL | Status: DC
Start: 2023-09-26 — End: 2023-09-27
  Administered 2023-09-26: 3.125 mg
  Filled 2023-09-26: qty 1

## 2023-09-26 MED ORDER — SPIRONOLACTONE 25 MG PO TABS
25.0000 mg | ORAL_TABLET | Freq: Every day | ORAL | Status: DC
  Administered 2023-09-26 – 2023-09-28 (×3): 25 mg via ORAL
  Filled 2023-09-26 (×3): qty 1

## 2023-09-26 MED ORDER — PANTOPRAZOLE SODIUM 40 MG PO TBEC
40.0000 mg | DELAYED_RELEASE_TABLET | Freq: Two times a day (BID) | ORAL | Status: DC
  Administered 2023-09-26 – 2023-09-28 (×3): 40 mg via ORAL
  Filled 2023-09-26 (×4): qty 1

## 2023-09-26 MED ORDER — ORAL CARE MOUTH RINSE: 15.0000 mL | OROMUCOSAL | Status: DC | PRN

## 2023-09-26 MED ORDER — ONDANSETRON HCL 4 MG/2ML IJ SOLN: 4.0000 mg | Freq: Four times a day (QID) | INTRAMUSCULAR | Status: DC | PRN

## 2023-09-26 NOTE — TOC Progression Note (Signed)
Transition of Care [facility]) - Progression Note    Patient Details  Name: Shawn Torres MRN: [id number] Date of Birth: 1966/04/17  Transition of Care [facility]) CM/SW Contact  Margarito Liner, LCSW Phone Number: 09/26/2023, 11:13 AM  Clinical Narrative:  CSW met with patient and daughter, Philis Pique. Daughter is hoping for discharge on Thursday if stable. She said that plan is to start drive to [place] once he is discharged. If he will not be stable by Thursday, she just needs to know so she can rearrange plans. MD is aware. They are going to drive in case they have to stop at a hospital on the way. Patient does not have insurance. Pharmacy is following for discharge medication needs.   Expected Discharge Plan and Services                                               Social Determinants of Health (SDOH) Interventions SDOH Screenings   Food Insecurity: Patient Unable To Answer (09/20/2023)  Housing: Patient Unable To Answer (09/20/2023)  Transportation Needs: Patient Unable To Answer (09/20/2023)  Utilities: Patient Unable To Answer (09/20/2023)  Financial Resource Strain: Low Risk  (09/19/2021)   Received from [facility], [facility]  Tobacco Use: Low Risk  (09/20/2023)    Readmission Risk Interventions    09/26/2023   11:12 AM 12/13/2021    4:10 PM 10/12/2021   12:41 PM  Readmission Risk Prevention Plan  Transportation Screening Complete Complete Complete  PCP or Specialist Appt within 3-5 Days   Complete  HRI or Home Care Consult   Not Complete  HRI or Home Care Consult comments   unsure of discharge needs  Social Work Consult for Recovery Care Planning/Counseling Complete  Complete  Palliative Care Screening Complete  Not Applicable  Medication Review Oceanographer) Complete Complete Complete  SW Recovery Care/Counseling Consult  Complete   Palliative Care Screening  Not Applicable   Skilled Nursing Facility  Not Applicable

## 2023-09-26 NOTE — Progress Notes (Signed)
Patient is sitting up in bed, endorsing nausea.  Palliative Care Progress Note, Assessment & Plan   Patient Name: Shawn Torres       Date: 09/26/2023 DOB: 10/03/1966  Age: 57 y.o. MRN#: [id number] Attending Physician: Marcelino Duster, MD Primary Care Physician: Kurtis Bushman, MD Admit Date: 09/20/2023  Subjective: Patient is sitting up in bed, endorsing nausea.  He has received nausea medicine less than 30 minutes ago.  Patient's daughter is at bedside.  She endorses patient took a sip of the thickened orange juice and felt like it was going to come back up.  Patient repositioned in bed for comfort.  HPI: Shawn Torres is a 57 y.o. male with medical history significant of alcoholic liver cirrhosis, with recurrent hepatic encephalopathy and ascites, recent upper GI bleed secondary to portal vein hypertension, sent by manage member for evaluation of confusion, worsening of nauseous vomiting.  Patient is admitted to the hospitalist service for further management evaluation of hepatic encephalopathy, intractable nausea, vomiting, ascites due to cirhosis, possible SBP.   10/31 - he was more lethargic at night, transferred to ICU for close monitoring. CT head unremarkable. Ammonia level high got Lactulose PR.  11/1: Sleepy but arousable. NG tube inserted for feeds and meds. 11/2- awake, does not talk. Rectal tube with loose stool. Daughters spoke to palliative. 11/3: more awake, coughing with sips of water. Has NG to low wall suction. 11/4: He is more alert, awake. NG removed, SLP evaluated, started diet.  11/5: Has rectal tube with liquid stool, on and off nauseous. Foley intact. Daughter wishes to drive him to [place] on Thursday.   Summary of counseling/coordination of care: Extensive chart  review completed prior to meeting patient including labs, vital signs, imaging, progress notes, orders, and available advanced directive documents from current and previous encounters.   After reviewing the patient's chart and assessing the patient at bedside, I spoke with patient and daughter in regards to symptoms and plan of care.  Nausea discussed.  Advised patient not to drink acidic beverages to avoid nauseous feeling.  He was in agreement.  No adjustment to [facility] needed at this time.  Did to elicit goals and boundaries of care.  Patient's daughter shares that she would like to know when patient is being discharged.  The plan is to have patient leave the hospital and drive straight to [place].  She shares that patient understands he has limited time left and wants to spend it with his wife, who he has not seen in over 50 years.  She resides in [place].  The plan is to have patient discharged in hopes of him making it by car to [place] and spending his final days with her.  Full code and full scope remain.  Plan is for patient to discharge ASAP to start journey to [place].  PMT contact info given to daughter.  She was advised to call with any acute palliative needs.  Daughter advised that PMT remains available to patient and family throughout his hospitalization.  We will stand back and monitor patient peripherally.  Please reengage with PMT if goals change, at patient/family's request, or if patient's health deteriorates prior to discharge.  Physical Exam Constitutional:  General: He is not in acute distress.    Appearance: He is normal weight.  HENT:     Head: Normocephalic.     Mouth/Throat:     Mouth: Mucous membranes are moist.  Eyes:     Pupils: Pupils are equal, round, and reactive to light.  Pulmonary:     Effort: Pulmonary effort is normal.  Abdominal:     Palpations: Abdomen is soft.  Skin:    General: Skin is warm and dry.  Neurological:     Mental Status: He is alert.   Psychiatric:        Mood and Affect: Mood normal.        Behavior: Behavior normal.        Judgment: Judgment normal.             Total Time 25 minutes   Time spent includes: Detailed review of medical records (labs, imaging, vital signs), medically appropriate exam (mental status, respiratory, cardiac, skin), discussed with treatment team, counseling and educating patient, family and staff, documenting clinical information, medication management and coordination of care.  Samara Deist L. Bonita Quin, DNP, FNP-BC Palliative Medicine Team

## 2023-09-26 NOTE — Progress Notes (Signed)
Progress Note   Patient: Shawn Torres ZOX:[id number] DOB: 1966/05/28 DOA: 09/20/2023     6 DOS: the patient was seen and examined on 09/26/2023   Brief hospital course: Shawn Torres is a 57 y.o. male with medical history significant of alcoholic liver cirrhosis, with recurrent hepatic encephalopathy and ascites, recent upper GI bleed secondary to portal vein hypertension, sent by manage member for evaluation of confusion, worsening of nauseous vomiting.  Patient is admitted to the hospitalist service for further management evaluation of hepatic encephalopathy, intractable nausea, vomiting, ascites due to cirhosis, possible SBP.  10/31 - he was more lethargic at night, transferred to ICU for close monitoring. CT head unremarkable. Ammonia level high got Lactulose PR.  11/1: Sleepy but arousable. NG tube inserted for feeds and meds. 11/2- awake, does not talk. Rectal tube with loose stool. Daughters spoke to palliative. 11/3: more awake, coughing with sips of water. Has NG to low wall suction. 11/4: He is more alert, awake. NG removed, SLP evaluated, started diet.  11/5: Has rectal tube with liquid stool, on and off nauseous. Foley intact. Daughter wishes to drive him to [place] on Thursday.   Assessment and Plan: Acute hepatic encephalopathy  Mental status improved. Alert, awake today, able to answer appropriately. Continue lactulose oral, titrate to 2-3 bowel movements. NG removed, on and off nausea per RN. Neurochecks per floor protocol. S/p IR paracentesis with 5.4 L fluid removal 09/20/23.  Stopped ceftriaxone as fluid cultures negative.   Acute on chronic ascites S/p IR paracentesis with 5.4 L fluid removal. He got Albumin 3 doses. Low dose Lasix, spironolactone restarted as kidney function improved.  Acute on chronic coagulopathy S/p Vitamin K x 2. no active bleeding at this time. Continue to monitor INR. INR 2.8. No active bleeding. Continue rifaximin  Acute kidney injury   Dehydration vs Hepatorenal causes. Improved with gentle IV hydration. Encourage oral diet, fluids. Restarted Lasix, aldactone low dose. Continue to monitor daily renal function. Avoid nephrotoxic drugs.   Intractable nausea vomiting Ileus Continue antiemetics.  SLP evaluated, dys 3, nectar thick liquids ordered.  Chronic anemia: No active bleeding. H&H stable. Reviewed patient history showed patient had recent coffee-ground vomiting secondary to grade 1 esophageal varices on EGD.   Nutrition Documentation    Flowsheet Row ED to Hosp-Admission (Current) from [facility]20/2023 in [facility]CCU  Nutrition Problem Moderate Malnutrition  Etiology chronic illness  [alcoholic cirrhosis]  Nutrition Goal Patient will meet greater than or equal to 90% of their needs  Interventions Tube feeding     Out of bed to chair.  PT evaluation. If more mobile, remove Foley catheter.  Plan to remove rectal tube in 1 day. Nursing supportive care. Fall, aspiration precautions. DVT prophylaxis   Code Status: Full Code  He had multiple admissions, MDF score 65, overall prognosis poor. Palliative care on going discussion with daughter regarding goals of care discussion. Patient wished for FULL CODE, wants to live in [place].  Subjective: Patient is seen and examined today morning. He is awake, responding appropriately.  Last night he had an episode of vomiting.    Physical Exam: Vitals:   09/26/23 0409 09/26/23 0500 09/26/23 0856 09/26/23 1146  BP: 106/79  106/74 107/73  Pulse: 94  86 95  Resp: 19     Temp: 99.6 F (37.6 C)  99.1 F (37.3 C) 99.5 F (37.5 C)  TempSrc:      SpO2: 97%  98% 98%  Weight:  72 kg    Height:  General - Middle aged Hispanic male, no apparent distress HEENT - PERRLA, EOMI, atraumatic head, non tender sinuses. Lung - Clear, basal rales, rhonchi. Heart - S1, S2 heard, no murmurs, rubs, trace pedal edema. Abdomen - Soft, non tender,  distended, bowel sounds good Neuro - alert, able to respond, moving all extremities Skin - Warm and dry.  Data Reviewed:      Latest Ref Rng & Units 09/25/2023    4:50 AM 09/23/2023    4:22 AM 09/22/2023    9:51 PM  CBC  WBC 4.0 - 10.5 K/uL 9.3  11.0  9.7   Hemoglobin 13.0 - 17.0 g/dL 8.8  9.4  9.9   Hematocrit 39.0 - 52.0 % 24.2  27.4  28.5   Platelets 150 - 400 K/uL 108  132  125       Latest Ref Rng & Units 09/25/2023    4:50 AM 09/24/2023    7:46 AM 09/23/2023    4:22 AM  BMP  Glucose 70 - 99 mg/dL 161  096  045   BUN 6 - 20 mg/dL 31  46  50   Creatinine 0.61 - 1.24 mg/dL 4.09  8.11  9.14   Sodium 135 - 145 mmol/L 139  140  138   Potassium 3.5 - 5.1 mmol/L 3.9  3.1  3.0   Chloride 98 - 111 mmol/L 115  116  109   CO2 22 - 32 mmol/L 19  17  16    Calcium 8.9 - 10.3 mg/dL 8.1  8.0  8.0    DG Abd 1 View  Result Date: 09/25/2023 CLINICAL DATA:  NG tube placement EXAM: ABDOMEN - 1 VIEW COMPARISON:  09/23/2023 FINDINGS: NG tube tip is in the proximal stomach with the side port near the GE junction. IMPRESSION: NG tube tip in the stomach. Electronically Signed   By: Charlett Nose M.D.   On: 09/25/2023 00:31     Family Communication: Daughter updated over phone. She understands and agrees.  Disposition: Status is: Inpatient Remains inpatient appropriate because: labile mental status, nausea, vomiting.  Rectal tube, Foley management  Planned Discharge Destination: Home with Home Health.  Daughter would like to drive him to [place] upon discharge.  Plan to remove foley, rectal tube in a day so he can be discharged on Thursday.    Time spent 37 min.  Author: Marcelino Duster, MD 09/26/2023 12:51 PM Secure chat 7am to 7pm For on call review [email or web address].

## 2023-09-26 NOTE — Plan of Care (Signed)

## 2023-09-27 DIAGNOSIS — D696 Thrombocytopenia, unspecified: Secondary | ICD-10-CM

## 2023-09-27 LAB — CBC
HCT: 24.1 % — ABNORMAL LOW (ref 39.0–52.0)
Hemoglobin: 8.9 g/dL — ABNORMAL LOW (ref 13.0–17.0)
MCH: 37.4 pg — ABNORMAL HIGH (ref 26.0–34.0)
MCHC: 36.9 g/dL — ABNORMAL HIGH (ref 30.0–36.0)
MCV: 101.3 fL — ABNORMAL HIGH (ref 80.0–100.0)
Platelets: 115 10*3/uL — ABNORMAL LOW (ref 150–400)
RBC: 2.38 MIL/uL — ABNORMAL LOW (ref 4.22–5.81)
RDW: 14.9 % (ref 11.5–15.5)
WBC: 11.6 10*3/uL — ABNORMAL HIGH (ref 4.0–10.5)
nRBC: 0 % (ref 0.0–0.2)

## 2023-09-27 LAB — GLUCOSE, CAPILLARY
Glucose-Capillary: 103 mg/dL — ABNORMAL HIGH (ref 70–99)
Glucose-Capillary: 115 mg/dL — ABNORMAL HIGH (ref 70–99)
Glucose-Capillary: 121 mg/dL — ABNORMAL HIGH (ref 70–99)
Glucose-Capillary: 124 mg/dL — ABNORMAL HIGH (ref 70–99)
Glucose-Capillary: 130 mg/dL — ABNORMAL HIGH (ref 70–99)
Glucose-Capillary: 167 mg/dL — ABNORMAL HIGH (ref 70–99)

## 2023-09-27 LAB — BASIC METABOLIC PANEL
Anion gap: 7 (ref 5–15)
BUN: 33 mg/dL — ABNORMAL HIGH (ref 6–20)
CO2: 16 mmol/L — ABNORMAL LOW (ref 22–32)
Calcium: 8.4 mg/dL — ABNORMAL LOW (ref 8.9–10.3)
Chloride: 114 mmol/L — ABNORMAL HIGH (ref 98–111)
Creatinine, Ser: 1.39 mg/dL — ABNORMAL HIGH (ref 0.61–1.24)
GFR, Estimated: 60 mL/min — ABNORMAL LOW (ref >60)
Glucose, Bld: 120 mg/dL — ABNORMAL HIGH (ref 70–99)
Potassium: 4.3 mmol/L (ref 3.5–5.1)
Sodium: 137 mmol/L (ref 135–145)

## 2023-09-27 MED ORDER — LACTULOSE 10 GM/15ML PO SOLN
20.0000 g | Freq: Three times a day (TID) | ORAL | Status: DC
  Administered 2023-09-27 – 2023-09-28 (×2): 20 g via ORAL
  Filled 2023-09-27 (×2): qty 30

## 2023-09-27 MED ORDER — SULFAMETHOXAZOLE-TRIMETHOPRIM 800-160 MG PO TABS
1.0000 | ORAL_TABLET | Freq: Every day | ORAL | Status: DC
Start: 2023-09-27 — End: 2023-09-28
  Administered 2023-09-27 – 2023-09-28 (×2): 1 via ORAL
  Filled 2023-09-27 (×2): qty 1

## 2023-09-27 MED ORDER — GUAIFENESIN 100 MG/5ML PO LIQD
5.0000 mL | ORAL | Status: DC | PRN
  Filled 2023-09-27: qty 10

## 2023-09-27 MED ORDER — ADULT MULTIVITAMIN W/MINERALS CH
1.0000 | ORAL_TABLET | Freq: Every day | ORAL | Status: DC
  Administered 2023-09-28: 1 via ORAL
  Filled 2023-09-27: qty 1

## 2023-09-27 MED ORDER — SODIUM CHLORIDE 0.9 % IV SOLN
12.5000 mg | Freq: Four times a day (QID) | INTRAVENOUS | Status: DC | PRN
  Filled 2023-09-27: qty 0.5

## 2023-09-27 MED ORDER — B COMPLEX-C PO TABS
1.0000 | ORAL_TABLET | Freq: Every day | ORAL | Status: DC
Start: 2023-09-28 — End: 2023-09-28
  Administered 2023-09-28: 1 via ORAL
  Filled 2023-09-27: qty 1

## 2023-09-27 MED ORDER — CARVEDILOL 3.125 MG PO TABS
3.1250 mg | ORAL_TABLET | Freq: Two times a day (BID) | ORAL | Status: DC
  Administered 2023-09-28: 3.125 mg via ORAL
  Filled 2023-09-27: qty 1

## 2023-09-27 MED ORDER — ACETAMINOPHEN 650 MG RE SUPP: 650.0000 mg | Freq: Four times a day (QID) | RECTAL | Status: DC | PRN

## 2023-09-27 MED ORDER — ONDANSETRON HCL 4 MG/2ML IJ SOLN
4.0000 mg | Freq: Four times a day (QID) | INTRAMUSCULAR | Status: DC | PRN
  Administered 2023-09-27: 4 mg via INTRAVENOUS
  Filled 2023-09-27 (×2): qty 2

## 2023-09-27 MED ORDER — SULFAMETHOXAZOLE-TRIMETHOPRIM 400-80 MG PO TABS: 1.0000 | ORAL_TABLET | Freq: Two times a day (BID) | ORAL | Status: DC

## 2023-09-27 MED ORDER — RIFAXIMIN 550 MG PO TABS
550.0000 mg | ORAL_TABLET | Freq: Two times a day (BID) | ORAL | Status: DC
  Administered 2023-09-27 – 2023-09-28 (×2): 550 mg via ORAL
  Filled 2023-09-27 (×2): qty 1

## 2023-09-27 MED ORDER — ACETAMINOPHEN 325 MG PO TABS: 650.0000 mg | ORAL_TABLET | Freq: Four times a day (QID) | ORAL | Status: DC | PRN

## 2023-09-27 NOTE — Progress Notes (Signed)
Pt vomited large amout 1000cc. Also has had hiccups since 7am. Notified md

## 2023-09-27 NOTE — Progress Notes (Signed)
Progress Note Patient: Shawn Torres ONG:[id number] DOB: 01/20/1966 DOA: 09/20/2023     7 DOS: the patient was seen and examined on 09/27/2023   Brief hospital course: Tamari Busic is a 57 y.o. male with medical history significant of alcoholic liver cirrhosis, with recurrent hepatic encephalopathy and ascites, recent upper GI bleed secondary to portal vein hypertension. Presented for confusion, worsening of nauseous vomiting.   Patient is admitted to the hospitalist service for further management evaluation of hepatic encephalopathy, intractable nausea, vomiting, ascites due to cirhosis, possible SBP.  10/31 - he was more lethargic at night, transferred to ICU for close monitoring. CT head unremarkable. Ammonia level high got Lactulose PR.  11/1: Sleepy but arousable. NG tube inserted for feeds and meds. 11/2- awake, does not talk. Rectal tube with loose stool. Daughters spoke to palliative. 11/3: more awake, coughing with sips of water. Has NG to low wall suction. 11/4: He is more alert, awake. NG removed, SLP evaluated, started diet.  11/5: Has rectal tube with liquid stool, on and off nauseous. Foley intact. Daughter wishes to drive him to [place] on Thursday.  11/6: remove rectal tube. Alert and oriented to self and location. Daughter endorses that he is improved overall and still planning to take him to [place] tomorrow. Has nausea and unable to tolerate PO this morning.   Assessment and Plan: Acute hepatic encephalopathy  Liver Cirrhosis with known esophageal varices and portal hypertension Mental status improved, per daughter.  - Continue lactulose oral, titrate to 2-3 bowel movements.  - anti emetics PRN - Neurochecks per floor protocol. - continue ppx bactrim  - Continue rifaximin - remove rectal tube - remove foley   Acute on chronic ascites- S/p IR paracentesis with 5.4 L fluid removal 09/20/23. S/p IR paracentesis with 5.4 L fluid removal. He got Albumin 3 doses. - Low  dose Lasix, spironolactone  Acute kidney injury- improving. Cr 1.39 today Dehydration vs Hepatorenal causes. Avoid nephrotoxic drugs.   Intractable nausea vomiting Ileus Continue antiemetics.  SLP evaluated, dys 3, nectar thick liquids ordered.  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 H&H stable. Reviewed patient history showed patient had recent coffee-ground vomiting secondary to grade 1 esophageal varices on EGD.   Nutrition Documentation    Flowsheet Row ED to Hosp-Admission (Current) from [facility]20/2023 in [facility]CCU  Nutrition Problem Moderate Malnutrition  Etiology chronic illness  [alcoholic cirrhosis]  Nutrition Goal Patient will meet greater than or equal to 90% of their needs  Interventions Tube feeding     Out of bed to chair.  PT evaluation. DVT prophylaxis   Code Status: Full Code  He had multiple admissions, MDF score 65, overall prognosis poor. Palliative care on going discussion with daughter regarding goals of care discussion. Patient wished for FULL CODE, wants to live in [place].  Subjective: Patient reports no abdominal pain, nausea. Of note, he did have hiccups at time of exam and per nursing report, vomited shortly after our encounter.   Physical Exam: Vitals:   09/27/23 0053 09/27/23 0427 09/27/23 0447 09/27/23 0833  BP: 123/79 117/73  104/66  Pulse: 80 72  81  Resp: 20 20  16   Temp: (!) 97.5 F (36.4 C) 97.6 F (36.4 C)  97.7 F (36.5 C)  TempSrc:      SpO2: 98% 99%  98%  Weight:   72.2 kg   Height:       General - alert to voice. no apparent distress HEENT - PERRLA, EOMI,  atraumatic head, non tender sinuses. Lung - Clear, basal rales, rhonchi. Heart - S1, S2 heard, no murmurs, rubs, trace pedal edema. Abdomen - Soft, non tender, distended, bowel sounds good Neuro - alert, able to respond, moving all extremities. Oriented to self and location only Skin - Warm and dry.  Data  Reviewed:    Latest Ref Rng & Units 09/27/2023    4:32 AM 09/25/2023    4:50 AM 09/23/2023    4:22 AM  CBC  WBC 4.0 - 10.5 K/uL 11.6  9.3  11.0   Hemoglobin 13.0 - 17.0 g/dL 8.9  8.8  9.4   Hematocrit 39.0 - 52.0 % 24.1  24.2  27.4   Platelets 150 - 400 K/uL 115  108  132       Latest Ref Rng & Units 09/27/2023    4:32 AM 09/25/2023    4:50 AM 09/24/2023    7:46 AM  BMP  Glucose 70 - 99 mg/dL 409  811  914   BUN 6 - 20 mg/dL 33  31  46   Creatinine 0.61 - 1.24 mg/dL 7.82  9.56  2.13   Sodium 135 - 145 mmol/L 137  139  140   Potassium 3.5 - 5.1 mmol/L 4.3  3.9  3.1   Chloride 98 - 111 mmol/L 114  115  116   CO2 22 - 32 mmol/L 16  19  17    Calcium 8.9 - 10.3 mg/dL 8.4  8.1  8.0    DG Abd 1 View  Result Date: 09/26/2023 CLINICAL DATA:  Nausea/vomiting EXAM: ABDOMEN - 1 VIEW COMPARISON:  09/25/2023 FINDINGS: Enteric tube is no longer visualized. Mild gastric distension. Mildly dilated loops of small bowel in the central abdomen. Mild to moderate right colonic stool burden. Mild degenerative changes of the lumbar spine. IMPRESSION: Enteric tube is no longer visualized. Mild gastric distension. Mildly dilated loops of small bowel in the central abdomen. Electronically Signed   By: Charline Bills M.D.   On: 09/26/2023 23:53    Family Communication: Daughter updated over phone. She understands and agrees.  Disposition: Status is: Inpatient Remains inpatient appropriate because: labile mental status, nausea, vomiting.  Rectal tube, Foley management  Planned Discharge Destination: Home with Home Health.  Daughter would like to drive him to [place] upon discharge.  Plan to remove foley, rectal tube in a day so he can be discharged on Thursday.    Time spent 45 min.  Author: Leeroy Bock, MD 09/27/2023 8:33 AM Secure chat 7am to 7pm For on call review [email or web address].

## 2023-09-27 NOTE — Evaluation (Signed)
Physical Therapy Evaluation Patient Details Name: Milledge Gerding MRN: [id number] DOB: [birth year] Today's Date: 09/27/2023  History of Present Illness  Pt is a 57 y.o. male with medical history significant of alcoholic liver cirrhosis, with recurrent hepatic encephalopathy and ascites, recent upper GI bleed secondary to portal vein hypertension, sent by manage member for evaluation of confusion, worsening of nauseous vomiting.  Patient is admitted to the hospitalist service on 09/20/23 for further management evaluation of hepatic encephalopathy, intractable nausea, vomiting, ascites due to cirhosis, possible SBP.  Clinical Impression  Pt is received in bed sleeping with family at bedside, once awakened he is agreeable to PT session. Pt's daughter assisted with subjective intake secondary to language barrier. At baseline, Pt recently began using RW for limited household amb distance, no reports of falls, and independent with ADLs although requires assist with IADLs and bathing. Pt performs bed mobility, transfers, and amb minA for safety. Pt requires assist with trunk control and BLE management during bed mobility possibly secondary to generalized weakness. During standing activities, Pt demonstrates anterior lean which able to self-correct following multi modal cuing; initial unsteadiness with standing balance which improved with increased time. Additionally, Pt able to amb approx 3 ft using 1 hand held assist at EOB but demonstrates gait impairments (see below for details). Educated Pt and family on PT role and proper safety techniques with OOB mobility-Pt and family verbalized understanding. Pt would benefit from cont skilled PT to address above deficits and promote optimal return to PLOF.      If plan is discharge home, recommend the following: A little help with walking and/or transfers;A little help with bathing/dressing/bathroom;Assistance with cooking/housework;Help with stairs or ramp for  entrance;Assist for transportation   Can travel by private vehicle        Equipment Recommendations None recommended by PT  Recommendations for Other Services       Functional Status Assessment Patient has had a recent decline in their functional status and demonstrates the ability to make significant improvements in function in a reasonable and predictable amount of time.     Precautions / Restrictions Precautions Precautions: Fall Precaution Comments: rectal tube Restrictions Weight Bearing Restrictions: No      Mobility  Bed Mobility Overal bed mobility: Needs Assistance Bed Mobility: Supine to Sit, Sit to Supine     Supine to sit: Min assist, HOB elevated, Used rails Sit to supine: Min assist, HOB elevated, Used rails   General bed mobility comments: Pt able to perform bed mobility minA for trunk control and BLE management; occasional cuing for use of bed railings    Transfers Overall transfer level: Needs assistance Equipment used: 1 person hand held assist Transfers: Sit to/from Stand Sit to Stand: Min assist           General transfer comment: Pt able to perform x2 STS with 1 HHA minA for safety and increase steadiness; notable generalized weakness and heavy reliance on UE support although able to become more steady with increased standing time    Ambulation/Gait Ambulation/Gait assistance: Min assist Gait Distance (Feet): 3 Feet Assistive device: 1 person hand held assist Gait Pattern/deviations: Step-to pattern, Decreased step length - right, Decreased step length - left, Decreased stride length, Trunk flexed, Wide base of support Gait velocity: Decreased     General Gait Details: Pt able to amb approx 3 ft with 1 HHA minA at edge of bed demonstrating decreased stride length and frequent cuing to improve forward trunk flex; generalized weakness  Stairs  Wheelchair Mobility     Tilt Bed    Modified Rankin (Stroke Patients Only)        Balance Overall balance assessment: Needs assistance Sitting-balance support: Single extremity supported, Feet supported Sitting balance-Leahy Scale: Fair Sitting balance - Comments: Pt able to maintain seated EOB balance with 1 UE support although demonstrates slouched posture Postural control: Other (comment) (anterior lean) Standing balance support: Bilateral upper extremity supported, During functional activity, Reliant on assistive device for balance Standing balance-Leahy Scale: Fair Standing balance comment: Pt able to maintain standing balance with BUE support and notable decreased right reaction requiring multi modal cuing; able to increase steadiness with time                             Pertinent Vitals/Pain Pain Assessment Pain Assessment: 0-10 Pain Score: 0-No pain    Home Living Family/patient expects to be discharged to:: Private residence Living Arrangements: Children Available Help at Discharge: Available 24 hours/day 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) Home Access: Stairs to enter Entrance Stairs-Rails: Doctor, general practice of Steps: 4-5   Home Layout: One level Home Equipment: Agricultural consultant (2 wheels)      Prior Function Prior Level of Function : Independent/Modified Independent             Mobility Comments: Per daughter, Pt uses RW to amb limited household amb distance as of 2 weeks ago ADLs Comments: Per daughter, Pt is independent with ADLs and she assist with IADLs and bathing     Extremity/Trunk Assessment   Upper Extremity Assessment Upper Extremity Assessment: Generalized weakness    Lower Extremity Assessment Lower Extremity Assessment: Generalized weakness       Communication   Communication Communication: Other (comment) (Spanish speaker; prefers daughter assisting with language barrier) Cueing Techniques: Verbal cues;Tactile cues  Cognition Arousal: Lethargic Behavior During Therapy: WFL for  tasks assessed/performed Overall Cognitive Status: Within Functional Limits for tasks assessed                                 General Comments: AO x4; Pleasant but very lethargic/tired, cooperative with PT session        General Comments General comments (skin integrity, edema, etc.): lines intact pre/post session    Exercises Other Exercises Other Exercises: Educated Pt and family on PT role and safety techniques during OOB mobility-Pt and family verbalized understanding   Assessment/Plan    PT Assessment Patient needs continued PT services  PT Problem List Decreased strength;Decreased mobility;Decreased range of motion;Decreased activity tolerance;Decreased balance       PT Treatment Interventions DME instruction;Therapeutic exercise;Gait training;Stair training;Functional mobility training;Therapeutic activities;Balance training    PT Goals (Current goals can be found in the Care Plan section)  Acute Rehab PT Goals Patient Stated Goal: to go home PT Goal Formulation: With patient Time For Goal Achievement: 10/11/23 Potential to Achieve Goals: Fair    Frequency Min 1X/week     Co-evaluation               AM-PAC PT "6 Clicks" Mobility  Outcome Measure Help needed turning from your back to your side while in a flat bed without using bedrails?: A Little Help needed moving from lying on your back to sitting on the side of a flat bed without using bedrails?: A Little Help needed moving to and from a bed to a chair (including a wheelchair)?: A  Little Help needed standing up from a chair using your arms (e.g., wheelchair or bedside chair)?: A Little Help needed to walk in hospital room?: A Lot Help needed climbing 3-5 steps with a railing? : A Lot 6 Click Score: 16    End of Session Equipment Utilized During Treatment: Gait belt Activity Tolerance: Patient limited by lethargy Patient left: in bed;with call bell/phone within reach;with bed alarm  set;with family/visitor present;with nursing/sitter in room Nurse Communication: Mobility status PT Visit Diagnosis: Unsteadiness on feet (R26.81);Muscle weakness (generalized) (M62.81)    Time: 0865-7846 PT Time Calculation (min) (ACUTE ONLY): 23 min   Charges:                 Elmon Else, SPT   Iysis Germain 09/27/2023, 2:22 PM

## 2023-09-27 NOTE — Progress Notes (Signed)
SLP Cancellation Note  Patient Details Name: Shawn Torres MRN: [id number] DOB: Apr 19, 1966   Cancelled treatment:       Reason Eval/Treat Not Completed: Medical issues which prohibited therapy (chart reviewed; consulted MD)  Per chart notes, pt had N/V earlier, "Pt vomited large amout 1000cc. Also has had hiccups since 7am.", per NSG note.  Will recommend continuing current dysphagia diet w/ nectar liquids, soft solids until more medical stable to lessen increased risk for (pharyngeal phase-related) aspiration -- pt has risk for aspiration of Vomit currently.  Recommend strict aspiration and REFLUX precautions w/ oral intake at this time. ST services will f/u next 1-2 days w/ pt's status and trials to upgrade diet to thin liquids if appropriate.  MD/NSG updated.      Jerilynn Som, MS, CCC-SLP Speech Language Pathologist Rehab Services; [facility] [phone] (ascom) Shawn Torres 09/27/2023, 2:08 PM

## 2023-09-27 NOTE — Progress Notes (Signed)
Removed rectal tube and foley catheter pre md orders. Pt tolerated well. Informed pt to use call belll for help with restroom. Placed call bell within reach, family at bedside.

## 2023-09-28 ENCOUNTER — Other Ambulatory Visit: Payer: Self-pay

## 2023-09-28 LAB — CBC
HCT: 19.3 % — ABNORMAL LOW (ref 39.0–52.0)
Hemoglobin: 7.2 g/dL — ABNORMAL LOW (ref 13.0–17.0)
MCH: 37.3 pg — ABNORMAL HIGH (ref 26.0–34.0)
MCHC: 37.3 g/dL — ABNORMAL HIGH (ref 30.0–36.0)
MCV: 100 fL (ref 80.0–100.0)
Platelets: 91 10*3/uL — ABNORMAL LOW (ref 150–400)
RBC: 1.93 MIL/uL — ABNORMAL LOW (ref 4.22–5.81)
RDW: 14.9 % (ref 11.5–15.5)
WBC: 7.6 10*3/uL (ref 4.0–10.5)
nRBC: 0 % (ref 0.0–0.2)

## 2023-09-28 LAB — COMPREHENSIVE METABOLIC PANEL
ALT: 12 U/L (ref 0–44)
AST: 21 U/L (ref 15–41)
Albumin: 2.3 g/dL — ABNORMAL LOW (ref 3.5–5.0)
Alkaline Phosphatase: 66 U/L (ref 38–126)
Anion gap: 4 — ABNORMAL LOW (ref 5–15)
BUN: 38 mg/dL — ABNORMAL HIGH (ref 6–20)
CO2: 19 mmol/L — ABNORMAL LOW (ref 22–32)
Calcium: 7.6 mg/dL — ABNORMAL LOW (ref 8.9–10.3)
Chloride: 110 mmol/L (ref 98–111)
Creatinine, Ser: 1.37 mg/dL — ABNORMAL HIGH (ref 0.61–1.24)
GFR, Estimated: >60 mL/min (ref >60)
Glucose, Bld: 87 mg/dL (ref 70–99)
Potassium: 4.7 mmol/L (ref 3.5–5.1)
Sodium: 133 mmol/L — ABNORMAL LOW (ref 135–145)
Total Bilirubin: 3.3 mg/dL — ABNORMAL HIGH (ref <1.2)
Total Protein: 5.9 g/dL — ABNORMAL LOW (ref 6.5–8.1)

## 2023-09-28 LAB — GLUCOSE, CAPILLARY
Glucose-Capillary: 78 mg/dL (ref 70–99)
Glucose-Capillary: 99 mg/dL (ref 70–99)
Glucose-Capillary: 108 mg/dL — ABNORMAL HIGH (ref 70–99)

## 2023-09-28 MED ORDER — RIFAXIMIN 550 MG PO TABS
550.0000 mg | ORAL_TABLET | Freq: Two times a day (BID) | ORAL | 0 refills | Status: AC
  Filled 2023-09-28: qty 28, 14d supply, fill #0

## 2023-09-28 MED ORDER — ACETAMINOPHEN 325 MG PO TABS
325.0000 mg | ORAL_TABLET | Freq: Four times a day (QID) | ORAL | 0 refills | Status: AC | PRN
  Filled 2023-09-28: qty 40, 10d supply, fill #0

## 2023-09-28 MED ORDER — PANTOPRAZOLE SODIUM 40 MG PO TBEC
40.0000 mg | DELAYED_RELEASE_TABLET | Freq: Every day | ORAL | 0 refills | Status: AC
  Filled 2023-09-28: qty 14, 14d supply, fill #0

## 2023-09-28 MED ORDER — SPIRONOLACTONE 25 MG PO TABS
25.0000 mg | ORAL_TABLET | Freq: Every day | ORAL | 0 refills | Status: AC
  Filled 2023-09-28: qty 14, 14d supply, fill #0

## 2023-09-28 MED ORDER — LACTULOSE 10 GM/15ML PO SOLN
20.0000 g | Freq: Two times a day (BID) | ORAL | 0 refills | Status: AC
  Filled 2023-09-28: qty 840, 14d supply, fill #0

## 2023-09-28 MED ORDER — ONDANSETRON 4 MG PO TBDP
4.0000 mg | ORAL_TABLET | Freq: Three times a day (TID) | ORAL | 0 refills | Status: AC | PRN
  Filled 2023-09-28: qty 30, 10d supply, fill #0

## 2023-09-28 MED ORDER — SULFAMETHOXAZOLE-TRIMETHOPRIM 800-160 MG PO TABS
1.0000 | ORAL_TABLET | Freq: Every day | ORAL | 0 refills | Status: AC
  Filled 2023-09-28: qty 14, 14d supply, fill #0

## 2023-09-28 MED ORDER — CARVEDILOL 3.125 MG PO TABS
3.1250 mg | ORAL_TABLET | Freq: Two times a day (BID) | ORAL | 0 refills | Status: AC
  Filled 2023-09-28: qty 28, 14d supply, fill #0

## 2023-09-28 NOTE — Consult Note (Signed)
Medication delivered to patient  Paschal Dopp, PharmD, BCPS

## 2023-09-28 NOTE — Discharge Summary (Signed)
Physician Discharge Summary  Patient: Shawn Torres ZOX:[id number] DOB: 09-20-1966   Code Status: Full Code Admit date: 09/20/2023 Discharge date: 09/28/2023 Disposition: Relative's home PCP: Kurtis Bushman, MD  Recommendations for Outpatient Follow-up:  Follow up with PCP upon arrival at destination  Discharge Diagnoses:  Principal Problem:   Encephalopathy Active Problems:   Alcoholic cirrhosis of liver with ascites (HCC)   Type 2 diabetes mellitus, without long-term current use of insulin (HCC)   Malnutrition of moderate degree   Acute hepatic encephalopathy (HCC)   AKI (acute kidney injury) (HCC)   [facility])  Brief Hospital Course Summary: Richard Ritchey is a 57 y.o. male with medical history significant of alcoholic liver cirrhosis, with recurrent hepatic encephalopathy and ascites, recent upper GI bleed secondary to portal vein hypertension.  Presented for hepatic encephalopathy, worsening of nauseous vomiting, ascites.   Patient had prolonged inpatient course managing acute problems of his end-stage liver cirrhosis including a paracentesis of 5.4L negative for SPB, NG tube, rectal tube, and indwelling foley. Got 3 doses of IV albumin. His prognosis is very poor.  Palliative was consulted. He was medically optimized and had improvement to being alert and oriented to self and location. He and his family aware that he is terminal and they wish to return him to [place] to be with his wife for the end of his life.  His vital signs remained stable and labs relatively stable as listed below.  He was provided medications and daughter received instructions for care.  He was discharged in fair condition to respect their wishes to spend his remaining time with his wife.     Discharge Condition: Critical, improved Recommended discharge diet:  low sodium and fluid restriction  Consultations: Palliative care  Procedures/Studies: Paracentesis   Allergies as of 09/28/2023        Reactions   Albumin Gc Rash   Patient gets rash with albumin. See media tab picture dated 12/24/21. Needs pre-meds w/ albumin (benadryl) to avoid allergic reaction.         Medication List     STOP taking these medications    feeding supplement Liqd   furosemide 40 MG tablet Commonly known as: LASIX   multivitamin with minerals Tabs tablet   ondansetron 4 MG tablet Commonly known as: ZOFRAN   tamsulosin 0.4 MG Caps capsule Commonly known as: FLOMAX   thiamine 100 MG tablet Commonly known as: Vitamin B-1       TAKE these medications    acetaminophen 325 MG tablet Commonly known as: Tylenol Take 1 tablet (325 mg total) by mouth every 6 (six) hours as needed for up to 10 days for mild pain (pain score 1-3), moderate pain (pain score 4-6), fever or headache.   carvedilol 3.125 MG tablet Commonly known as: COREG Take 1 tablet (3.125 mg total) by mouth 2 (two) times daily with a meal for 14 days. What changed:  medication strength how much to take when to take this   lactulose 10 GM/15ML solution Commonly known as: CHRONULAC Take 30 mLs (20 g total) by mouth 2 (two) times daily for 14 days. What changed:  how much to take when to take this   ondansetron 4 MG disintegrating tablet Commonly known as: ZOFRAN-ODT Take 1 tablet (4 mg total) by mouth every 8 (eight) hours as needed for up to 14 days for nausea or vomiting.   pantoprazole 40 MG tablet Commonly known as: PROTONIX Take 1 tablet (40 mg total) by mouth daily for  14 days. What changed: when to take this   rifaximin 550 MG Tabs tablet Commonly known as: XIFAXAN Take 1 tablet (550 mg total) by mouth 2 (two) times daily for 14 days. What changed: when to take this   spironolactone 25 MG tablet Commonly known as: ALDACTONE Take 1 tablet (25 mg total) by mouth daily for 14 days. What changed:  medication strength how much to take   sulfamethoxazole-trimethoprim 800-160 MG tablet Commonly known  as: BACTRIM DS Take 1 tablet by mouth daily for 14 days.       Subjective   Pt reports feeling well. Denies nausea or vomiting or abdominal pain.  All questions and concerns were addressed at time of discharge.  Objective  Blood pressure (!) 104/52, pulse 65, temperature 98.1 F (36.7 C), temperature source Oral, resp. rate 20, height 5\' 5"  (1.651 m), weight 72.9 kg, SpO2 98%.   General: Pt is alert, awake, not in acute distress Cardiovascular: RRR, S1/S2 +, no rubs, no gallops Respiratory: CTA bilaterally, no wheezing, no rhonchi Abdominal: Soft, NT, ND, bowel sounds + Extremities: no edema, no cyanosis  The results of significant diagnostics from this hospitalization (including imaging, microbiology, ancillary and laboratory) are listed below for reference.   Imaging studies: DG Abd 1 View  Result Date: 09/26/2023 CLINICAL DATA:  Nausea/vomiting EXAM: ABDOMEN - 1 VIEW COMPARISON:  09/25/2023 FINDINGS: Enteric tube is no longer visualized. Mild gastric distension. Mildly dilated loops of small bowel in the central abdomen. Mild to moderate right colonic stool burden. Mild degenerative changes of the lumbar spine. IMPRESSION: Enteric tube is no longer visualized. Mild gastric distension. Mildly dilated loops of small bowel in the central abdomen. Electronically Signed   By: Charline Bills M.D.   On: 09/26/2023 23:53   DG Abd 1 View  Result Date: 09/25/2023 CLINICAL DATA:  NG tube placement EXAM: ABDOMEN - 1 VIEW COMPARISON:  09/23/2023 FINDINGS: NG tube tip is in the proximal stomach with the side port near the GE junction. IMPRESSION: NG tube tip in the stomach. Electronically Signed   By: Charlett Nose M.D.   On: 09/25/2023 00:31   DG Abd 1 View  Result Date: 09/23/2023 CLINICAL DATA:  [id number] Encounter for nasogastric (NG) tube placement [id number] EXAM: ABDOMEN - 1 VIEW COMPARISON:  09/22/2023 FINDINGS: Limited radiograph of the lower chest and upper abdomen was obtained for the  purposes of enteric tube localization. Enteric tube is seen coursing below the diaphragm with distal tip and side port terminating within the expected location of the gastric body. Gaseous distension of bowel within the imaged upper abdomen, similar to prior. IMPRESSION: Enteric tube terminates within the expected location of the gastric body. Electronically Signed   By: Duanne Guess D.O.   On: 09/23/2023 13:22   DG Abd 1 View  Result Date: 09/22/2023 CLINICAL DATA:  Vomiting EXAM: ABDOMEN - 1 VIEW COMPARISON:  09/22/2023 FINDINGS: Gas-filled nondistended colon with scattered gas in nondistended small bowel. Bowel gas is decreased since prior study. Changes are likely due to ileus. Enteric tube is again demonstrated with tip in the right upper quadrant consistent with location in the distal stomach. Calcification in the right upper quadrant consistent with gallstones. Vascular calcifications. Acute fractures are suggested in the anterior right ninth and tenth ribs. Lung bases are clear. Degenerative changes in the spine. IMPRESSION: 1. Gas-filled nondistended colon with scattered gas in the small bowel, decreased since prior study. Changes are likely due to ileus. 2. Enteric tube tip projects over  the right upper quadrant consistent with location in the distal stomach. 3. Acute appearing fractures of right anterior ninth and tenth ribs. Electronically Signed   By: Burman Nieves M.D.   On: 09/22/2023 22:33   DG Abd 1 View  Result Date: 09/22/2023 CLINICAL DATA:  Nasogastric tube placement. EXAM: ABDOMEN - 1 VIEW COMPARISON:  September 20, 2023. FINDINGS: Distal tip of nasogastric tube is seen in expected position of proximal stomach. Minimally dilated small bowel loops are noted suggesting ileus. IMPRESSION: Nasogastric tube tip seen in expected position of proximal stomach. Electronically Signed   By: Lupita Raider M.D.   On: 09/22/2023 14:29   CT HEAD WO CONTRAST ( )  Result Date:  09/21/2023 CLINICAL DATA:  Initial evaluation for mental status change, unknown cause. EXAM: CT HEAD WITHOUT CONTRAST TECHNIQUE: Contiguous axial images were obtained from the base of the skull through the vertex without intravenous contrast. RADIATION DOSE REDUCTION: This exam was performed according to the departmental dose-optimization program which includes automated exposure control, adjustment of the mA and/or kV according to patient size and/or use of iterative reconstruction technique. COMPARISON:  CT from 09/20/2023. FINDINGS: Brain: Examination technically limited by positioning. Cerebral volume within normal limits. Changes of chronic microvascular ischemic disease again noted. No acute intracranial hemorrhage. No acute large vessel territory infarct. No mass lesion or midline shift. No hydrocephalus or extra-axial fluid collection. Vascular: No abnormal hyperdense vessel. Skull: Scalp soft tissues and calvarium demonstrate no new finding. Sinuses/Orbits: Globes and orbital soft tissues demonstrate no acute finding. Remote posttraumatic defect noted at the right lamina papyracea. Changes of pan sinusitis, slightly improved from prior. Mastoid air cells remain clear. Other: None. IMPRESSION: 1. No acute intracranial abnormality. 2. Changes of chronic microvascular ischemic disease. 3. Pan sinusitis, slightly improved from prior. Electronically Signed   By: Rise Mu M.D.   On: 09/21/2023 23:08   US Paracentesis  Result Date: 09/20/2023 INDICATION: Patient with a history of alcoholic cirrhosis with recurrent ascites. Diagnostic and therapeutic paracentesis requested. EXAM: ULTRASOUND GUIDED PARACENTESIS MEDICATIONS: 1% lidocaine 10 mL COMPLICATIONS: None immediate. PROCEDURE: Informed written consent was obtained from the patient after a discussion of the risks, benefits and alternatives to treatment. A timeout was performed prior to the initiation of the procedure. Initial ultrasound  scanning demonstrates a large amount of ascites within the right lower abdominal quadrant. The right lower abdomen was prepped and draped in the usual sterile fashion. 1% lidocaine was used for local anesthesia. Following this, a 19 gauge, 7-cm, Yueh catheter was introduced. An ultrasound image was saved for documentation purposes. The paracentesis was performed. The catheter was removed and a dressing was applied. The patient tolerated the procedure well without immediate post procedural complication. Patient received post-procedure intravenous albumin; see nursing notes for details. FINDINGS: A total of approximately 5.4 L of clear yellow fluid was removed. Samples were sent to the laboratory as requested by the clinical team. IMPRESSION: Successful ultrasound-guided paracentesis yielding 5.4 liters of peritoneal fluid. Procedure performed by Alwyn Ren NP PLAN: The patient has previously been evaluated by the [facility] Interventional Radiology Portal Hypertension Clinic, and deemed not a candidate for intervention. Electronically Signed   By: Olive Bass M.D.   On: 09/20/2023 15:27   DG Abd 2 Views  Result Date: 09/20/2023 CLINICAL DATA:  Nausea and vomiting. EXAM: ABDOMEN - 2 VIEW COMPARISON:  CT 09/12/2023 FINDINGS: Relative increased density to the abdomen overall consistent with known history of ascites. There are several air-filled mildly distended loops and  mildly dilated loops of small bowel in the midabdomen, similar to previous. Scattered colonic stool. Distended stomach. No obvious free air seen beneath the diaphragm on the upright views. Somewhat limited overall evaluation due to portable technique is under penetrated. IMPRESSION: Persistent mildly dilated loops of small bowel in the midabdomen. Gas and stool along the colon. Increased density of the abdomen again could be the sequela of ascites. Electronically Signed   By: Karen Kays M.D.   On: 09/20/2023 10:02   DG Chest Portable 1  View  Result Date: 09/20/2023 CLINICAL DATA:  Altered mental status. History of alcoholism and cirrhosis EXAM: PORTABLE CHEST 1 VIEW COMPARISON:  Abdominal CT from 8 days ago. FINDINGS: Low volume chest. There is no edema, consolidation, effusion, or pneumothorax. Normal heart size and mediastinal contours. IMPRESSION: No active disease. Electronically Signed   By: Tiburcio Pea M.D.   On: 09/20/2023 06:04   CT HEAD WO CONTRAST ( )  Result Date: 09/20/2023 CLINICAL DATA:  Nonfocal altered mental status.  Alcoholic. EXAM: CT HEAD WITHOUT CONTRAST TECHNIQUE: Contiguous axial images were obtained from the base of the skull through the vertex without intravenous contrast. RADIATION DOSE REDUCTION: This exam was performed according to the departmental dose-optimization program which includes automated exposure control, adjustment of the mA and/or kV according to patient size and/or use of iterative reconstruction technique. COMPARISON:  09/07/2023 FINDINGS: Brain: No evidence of acute infarction, hemorrhage, hydrocephalus, extra-axial collection or mass lesion/mass effect. Patchy low-density in the cerebral white matter, chronic and usually from small vessel ischemia. Vascular: No hyperdense vessel or unexpected calcification. Skull: Normal. Negative for fracture or focal lesion. Sinuses/Orbits: Interval opacification of the paranasal sinuses with fluid levels diffusely. Remote blowout fracture of the right medial orbit wall. IMPRESSION: 1. Acute sinusitis with fluid level seen diffusely, new since recent head CT comparison. 2. No acute intracranial finding.  Chronic white matter disease. Electronically Signed   By: Tiburcio Pea M.D.   On: 09/20/2023 05:32   CT ABDOMEN PELVIS W CONTRAST  Result Date: 09/12/2023 CLINICAL DATA:  Generalized abdominal pain, hypotension, lactic acidosis, history of cirrhosis EXAM: CT ABDOMEN AND PELVIS WITH CONTRAST TECHNIQUE: Multidetector CT imaging of the abdomen and  pelvis was performed using the standard protocol following bolus administration of intravenous contrast. RADIATION DOSE REDUCTION: This exam was performed according to the departmental dose-optimization program which includes automated exposure control, adjustment of the mA and/or kV according to patient size and/or use of iterative reconstruction technique. CONTRAST:  OMNIPAQUE IOHEXOL 300 MG/ML  SOLN COMPARISON:  CT 07/25/2019. FINDINGS: Lower chest: Borderline cardiomegaly with coronary artery calcifications. Trace pleural thickening and basilar atelectasis. Hepatobiliary: Cirrhotic hepatic morphology with nodular contours. No evidence of focal hepatic lesion. Calcified gallstone without pericholecystic inflammatory change. No intra or extrahepatic biliary ductal dilatation. Pancreas: Parenchymal atrophy. No ductal dilatation or inflammation. Spleen: Nonenlarged. Small cleft posteriorly. No focal abnormality. Adrenals/Urinary Tract: No adrenal nodule. No hydronephrosis, renal calculi, or suspicious renal abnormality. Partially distended urinary bladder, normal for degree of distension. Stomach/Bowel: Bowel assessment is limited in the absence of enteric contrast and presence of ascites. There are paraesophageal and perigastric varices. Fluid within the stomach which is not abnormally distended. The small bowel is diffusely fluid-filled, dilated in the central abdomen up to 4.5 cm. There is no discrete transition point to suggest obstruction. Normal appendix visualized. There is formed stool in the ascending colon. Mild gaseous distension of transverse colon. Liquid stool in the descending colon with question of wall hyperemia. No bowel  pneumatosis. Vascular/Lymphatic: Small amount of nonocclusive thrombus in the extrahepatic main portal vein, for example series 2, image 22-26. Small volume nonocclusive thrombus in the superior mesenteric vein, series 2, image 37. Paraesophageal and perigastric varices. No  portal venous or mesenteric gas. Normal caliber abdominal aorta. Limited assessment for adenopathy in the setting of ascites. Reproductive: Prostate is unremarkable. Other: Moderate volume abdominopelvic ascites. Generalized mesenteric edema with small volume mesenteric ascites. Ascites tracks into the inguinal canals, more so on the left. No free intra-abdominal air. Musculoskeletal: Scattered Schmorl's nodes in the spine. There are no acute or suspicious osseous abnormalities. IMPRESSION: 1. 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 2. Cirrhosis with sequela of portal hypertension including moderate volume abdominopelvic ascites, paraesophageal and perigastric varices. 3. Diffusely fluid-filled small bowel, dilated in the central abdomen up to 4.5 cm. No discrete transition point to suggest obstruction. Findings may represent ileus or enteritis. 4. Liquid stool in the descending colon with question of wall hyperemia. Consider colitis. 5. Cholelithiasis. 6. Aortic Atherosclerosis (ICD10-I70.0). Coronary artery calcifications. Electronically Signed   By: Narda Rutherford M.D.   On: 09/12/2023 21:05   DG Chest Port 1 View  Result Date: 09/12/2023 CLINICAL DATA:  Sepsis EXAM: PORTABLE CHEST 1 VIEW COMPARISON:  Chest x-ray 09/10/2023 FINDINGS: The heart size and mediastinal contours are within normal limits. Both lungs are clear. The visualized skeletal structures are unremarkable. IMPRESSION: No active disease. Electronically Signed   By: Darliss Cheney M.D.   On: 09/12/2023 19:39   US Paracentesis  Result Date: 09/11/2023 INDICATION: Patient with a history of alcoholic cirrhosis with recurrent ascites. Therapeutic paracentesis requested. EXAM: ULTRASOUND GUIDED PARACENTESIS MEDICATIONS: 1% lidocaine 10 mL COMPLICATIONS: None immediate. PROCEDURE: Informed written consent was obtained from the patient after a discussion of the risks, benefits and  alternatives to treatment. A timeout was performed prior to the initiation of the procedure. Initial ultrasound scanning demonstrates a large amount of ascites within the right lower abdominal quadrant. The right lower abdomen was prepped and draped in the usual sterile fashion. 1% lidocaine was used for local anesthesia. Following this, a 19 gauge, 7-cm, Yueh catheter was introduced. An ultrasound image was saved for documentation purposes. The paracentesis was performed. The catheter was removed and a dressing was applied. The patient tolerated the procedure well without immediate post procedural complication. Patient received post-procedure intravenous albumin; see nursing notes for details. FINDINGS: A total of approximately 6.5 L of clear yellow fluid was removed. IMPRESSION: Successful ultrasound-guided paracentesis yielding 6.5 liters of peritoneal fluid. Procedure performed by Alwyn Ren NP PLAN: The patient has previously been evaluated by the [facility] Interventional Radiology Portal Hypertension Clinic, and deemed not a candidate for intervention. Please see Dr. Jerrye Noble note from 02/15/22. Electronically Signed   By: Malachy Moan M.D.   On: 09/11/2023 16:22   DG Chest Port 1 View  Result Date: 09/10/2023 CLINICAL DATA:  [id number] Aspiration of gastric contents [id number] EXAM: PORTABLE CHEST 1 VIEW COMPARISON:  09/07/2023 FINDINGS: The heart size and mediastinal contours are within normal limits. Aortic atherosclerosis. Low lung volumes. No focal airspace consolidation, pleural effusion, or pneumothorax. The visualized skeletal structures are unremarkable. IMPRESSION: Low lung volumes. No acute cardiopulmonary findings. Electronically Signed   By: Duanne Guess D.O.   On: 09/10/2023 16:08   CT Head Wo Contrast  Result Date: 09/07/2023 CLINICAL DATA:  Mental status change, unknown cause EXAM: CT HEAD WITHOUT CONTRAST TECHNIQUE: Contiguous axial images were obtained from the base  of the  skull through the vertex without intravenous contrast. RADIATION DOSE REDUCTION: This exam was performed according to the departmental dose-optimization program which includes automated exposure control, adjustment of the mA and/or kV according to patient size and/or use of iterative reconstruction technique. COMPARISON:  CT Head 07/24/22 FINDINGS: Brain: No evidence of acute infarction, hemorrhage, hydrocephalus, extra-axial collection or mass lesion/mass effect. There is sequela of mild chronic microvascular ischemic change. Vascular: No hyperdense vessel or unexpected calcification. Skull: Normal. Negative for fracture or focal lesion. Sinuses/Orbits: No middle ear or mastoid effusion. Paranasal sinuses are notable for mucosal thickening in the bilateral maxillary sinuses. There is a chronic fracture of the lamina papyracea on the right. Dysconjugate gaze. Orbits are otherwise unremarkable. Other: None. IMPRESSION: No acute intracranial abnormality. Electronically Signed   By: Lorenza Cambridge M.D.   On: 09/07/2023 17:47   DG Ankle 2 Views Right  Result Date: 09/07/2023 CLINICAL DATA:  Bruising. EXAM: RIGHT ANKLE - 2 VIEW COMPARISON:  None Available. FINDINGS: Chronic ankylosis of the distal tibiofibular syndesmosis. No acute fracture or dislocation. Soft tissues are radiographically unremarkable. IMPRESSION: No acute fracture or dislocation. Electronically Signed   By: Minerva Fester M.D.   On: 09/07/2023 17:33   DG Chest Port 1 View  Result Date: 09/07/2023 CLINICAL DATA:  Weakness. Questionable sepsis, evaluate for abnormality EXAM: PORTABLE CHEST 1 VIEW COMPARISON:  07/24/2022 FINDINGS: Low lung volumes. Stable cardiomediastinal silhouette. Elevated right hemidiaphragm. No focal consolidation, pleural effusion, or pneumothorax. No displaced rib fractures. IMPRESSION: No active disease. Electronically Signed   By: Minerva Fester M.D.   On: 09/07/2023 17:31    Labs: Basic Metabolic Panel: Recent Labs   Lab 09/21/23 2105 09/21/23 2106 09/22/23 1143 09/22/23 1703 09/23/23 0422 09/23/23 1658 09/24/23 0746 09/25/23 0450 09/27/23 0432 09/28/23 0347  NA  --    < >  --   --  138  --  140 139 137 133*  K  --    < >  --   --  3.0*  --  3.1* 3.9 4.3 4.7  CL  --    < >  --   --  109  --  116* 115* 114* 110  CO2  --    < >  --   --  16*  --  17* 19* 16* 19*  GLUCOSE  --    < >  --   --  131*  --  141* 131* 120* 87  BUN  --    < >  --   --  50*  --  46* 31* 33* 38*  CREATININE  --    < >  --   --  2.44*  --  1.66* 1.21 1.39* 1.37*  CALCIUM  --    < >  --   --  8.0*  --  8.0* 8.1* 8.4* 7.6*  MG 2.1  --  2.1 2.1 2.1 2.2  --   --   --   --   PHOS 4.6  --  5.0* 4.9* 4.7* 3.9  --   --   --   --    < > = values in this interval not displayed.   CBC: Recent Labs  Lab 09/21/23 2106 09/22/23 0451 09/22/23 2151 09/23/23 0422 09/25/23 0450 09/27/23 0432 09/28/23 0347  WBC 6.9   < > 9.7 11.0* 9.3 11.6* 7.6  NEUTROABS 4.7  --  7.1  --   --   --   --  HGB 10.0*   < > 9.9* 9.4* 8.8* 8.9* 7.2*  HCT 27.8*   < > 28.5* 27.4* 24.2* 24.1* 19.3*  MCV 101.8*   < > 96.9 97.2 98.8 101.3* 100.0  PLT 127*   < > 125* 132* 108* 115* 91*   < > = values in this interval not displayed.   Microbiology: Results for orders placed or performed during the hospital encounter of 09/20/23  Culture, body fluid w Gram Stain-bottle     Status: None   Collection Time: 09/20/23  1:30 PM   Specimen: Peritoneal Washings  Result Value Ref Range Status   Specimen Description PERITONEAL  Final   Special Requests NONE  Final   Culture   Final    NO GROWTH 5 DAYS Performed at [facility], [address], [place], [place] [address]    Report Status 09/25/2023 FINAL  Final  Gram stain     Status: None   Collection Time: 09/20/23  1:30 PM   Specimen: Peritoneal Washings  Result Value Ref Range Status   Specimen Description PERITONEAL  Final   Special Requests NONE  Final   Gram Stain   Final    NO WBC SEEN NO  ORGANISMS SEEN CYTOSPIN SMEAR Performed at [facility], [address] [place], [place] [address]    Report Status 09/20/2023 FINAL  Final  MRSA Next Gen by PCR, Nasal     Status: None   Collection Time: 09/22/23  2:53 AM   Specimen: Nasal Mucosa; Nasal Swab  Result Value Ref Range Status   MRSA by PCR Next Gen NOT DETECTED NOT DETECTED Final    Comment: (NOTE) The GeneXpert MRSA Assay (FDA approved for NASAL specimens only), is one component of a comprehensive MRSA colonization surveillance program. It is not intended to diagnose MRSA infection nor to guide or monitor treatment for MRSA infections. Test performance is not FDA approved in patients less than 25 years old. Performed at [facility], [address] [place], [place] [address]    Time coordinating discharge: Over 30 minutes  Leeroy Bock, MD  [facility] 09/28/2023, 10:42 AM

## 2023-09-28 NOTE — Discharge Instructions (Signed)
Please review his medications carefully and try to give them as instructed to help keep him stable.  I have prescribed zofran for nausea and you may want to give this to him regularly so that he is able to keep down his other medicines. He has had nausea and vomiting every once in a while here.  He will have urine and stool accidents so I recommend using "depends" type products. Please follow up with an emergency department if his condition worsens Good luck with your travels

## 2023-09-28 NOTE — TOC Transition Note (Signed)
Transition of Care [facility]) - CM/SW Discharge Note   Patient Details  Name: Shawn Torres MRN: [id number] Date of Birth: 1966-05-06  Transition of Care [facility]) CM/SW Contact:  Margarito Liner, LCSW Phone Number: 09/28/2023, 10:53 AM   Clinical Narrative:  Patient has orders to discharge today. Pharmacist is coordinating getting medications from [facility]. No further concerns. CSW signing off.   Final next level of care: Home/Self Care Barriers to Discharge: Barriers Resolved   Patient Goals and CMS Choice      Discharge Placement                  Patient to be transferred to facility by: Daughter   Patient and family notified of of transfer: 09/28/23  Discharge Plan and Services Additional resources added to the After Visit Summary for                                       Social Determinants of Health (SDOH) Interventions SDOH Screenings   Food Insecurity: Patient Unable To Answer (09/20/2023)  Housing: Patient Unable To Answer (09/20/2023)  Transportation Needs: Patient Unable To Answer (09/20/2023)  Utilities: Patient Unable To Answer (09/20/2023)  Financial Resource Strain: Low Risk  (09/19/2021)   Received from [facility], [facility]  Tobacco Use: Low Risk  (09/20/2023)     Readmission Risk Interventions    09/26/2023   11:12 AM 12/13/2021    4:10 PM 10/12/2021   12:41 PM  Readmission Risk Prevention Plan  Transportation Screening Complete Complete Complete  PCP or Specialist Appt within 3-5 Days   Complete  HRI or Home Care Consult   Not Complete  HRI or Home Care Consult comments   unsure of discharge needs  Social Work Consult for Recovery Care Planning/Counseling Complete  Complete  Palliative Care Screening Complete  Not Applicable  Medication Review Oceanographer) Complete Complete Complete  SW Recovery Care/Counseling Consult  Complete   Palliative Care Screening  Not Applicable   Skilled Nursing Facility   Not Applicable

## 2023-09-28 NOTE — Plan of Care (Signed)
